# Patient Record
Sex: Male | Born: 1935 | Race: Black or African American | Hispanic: No | Smoking: Former smoker
Health system: Southern US, Community
[De-identification: ages and names within clinical notes are randomized; demographics above are authoritative.]

## PROBLEM LIST (undated history)

## (undated) DIAGNOSIS — U071 COVID-19: Secondary | ICD-10-CM

## (undated) DIAGNOSIS — M6282 Rhabdomyolysis: Secondary | ICD-10-CM

## (undated) DIAGNOSIS — H269 Unspecified cataract: Secondary | ICD-10-CM

## (undated) DIAGNOSIS — E785 Hyperlipidemia, unspecified: Secondary | ICD-10-CM

## (undated) DIAGNOSIS — N189 Chronic kidney disease, unspecified: Secondary | ICD-10-CM

## (undated) DIAGNOSIS — I10 Essential (primary) hypertension: Secondary | ICD-10-CM

## (undated) DIAGNOSIS — M1990 Unspecified osteoarthritis, unspecified site: Secondary | ICD-10-CM

## (undated) DIAGNOSIS — F32A Depression, unspecified: Secondary | ICD-10-CM

## (undated) DIAGNOSIS — I639 Cerebral infarction, unspecified: Secondary | ICD-10-CM

## (undated) DIAGNOSIS — F329 Major depressive disorder, single episode, unspecified: Secondary | ICD-10-CM

## (undated) HISTORY — PX: CHOLECYSTECTOMY: SHX55

## (undated) HISTORY — DX: Hyperlipidemia, unspecified: E78.5

## (undated) HISTORY — PX: CATARACT EXTRACTION: SUR2

## (undated) HISTORY — PX: SPINE SURGERY: SHX786

## (undated) HISTORY — DX: Unspecified cataract: H26.9

## (undated) HISTORY — DX: Unspecified osteoarthritis, unspecified site: M19.90

## (undated) HISTORY — DX: Chronic kidney disease, unspecified: N18.9

## (undated) HISTORY — DX: Depression, unspecified: F32.A

## (undated) HISTORY — PX: FOOT SURGERY: SHX648

## (undated) HISTORY — DX: Major depressive disorder, single episode, unspecified: F32.9

## (undated) HISTORY — PX: BACK SURGERY: SHX140

---
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
## 2000-06-26 ENCOUNTER — Emergency Department (HOSPITAL_COMMUNITY): Admission: EM | Admit: 2000-06-26 | Discharge: 2000-06-27 | Payer: Self-pay | Admitting: *Deleted

## 2000-07-30 ENCOUNTER — Ambulatory Visit (HOSPITAL_COMMUNITY): Admission: RE | Admit: 2000-07-30 | Discharge: 2000-07-30 | Payer: Self-pay | Admitting: General Surgery

## 2000-09-17 ENCOUNTER — Encounter: Admission: RE | Admit: 2000-09-17 | Discharge: 2000-12-16 | Payer: Self-pay | Admitting: Internal Medicine

## 2001-07-27 ENCOUNTER — Emergency Department (HOSPITAL_COMMUNITY): Admission: EM | Admit: 2001-07-27 | Discharge: 2001-07-27 | Payer: Self-pay | Admitting: Emergency Medicine

## 2003-06-08 ENCOUNTER — Ambulatory Visit (HOSPITAL_COMMUNITY): Admission: RE | Admit: 2003-06-08 | Discharge: 2003-06-08 | Payer: Self-pay | Admitting: Internal Medicine

## 2003-07-21 ENCOUNTER — Ambulatory Visit (HOSPITAL_COMMUNITY): Admission: RE | Admit: 2003-07-21 | Discharge: 2003-07-21 | Payer: Self-pay | Admitting: Neurosurgery

## 2003-07-23 ENCOUNTER — Ambulatory Visit (HOSPITAL_COMMUNITY): Admission: RE | Admit: 2003-07-23 | Discharge: 2003-07-23 | Payer: Self-pay | Admitting: Neurosurgery

## 2003-10-14 ENCOUNTER — Inpatient Hospital Stay (HOSPITAL_COMMUNITY): Admission: RE | Admit: 2003-10-14 | Discharge: 2003-10-16 | Payer: Self-pay | Admitting: Neurosurgery

## 2003-11-06 ENCOUNTER — Emergency Department (HOSPITAL_COMMUNITY): Admission: EM | Admit: 2003-11-06 | Discharge: 2003-11-06 | Payer: Self-pay | Admitting: Emergency Medicine

## 2004-01-11 ENCOUNTER — Inpatient Hospital Stay (HOSPITAL_COMMUNITY): Admission: RE | Admit: 2004-01-11 | Discharge: 2004-01-14 | Payer: Self-pay | Admitting: Neurosurgery

## 2004-02-01 ENCOUNTER — Emergency Department (HOSPITAL_COMMUNITY): Admission: EM | Admit: 2004-02-01 | Discharge: 2004-02-01 | Payer: Self-pay | Admitting: Emergency Medicine

## 2004-03-12 ENCOUNTER — Ambulatory Visit (HOSPITAL_COMMUNITY): Admission: RE | Admit: 2004-03-12 | Discharge: 2004-03-12 | Payer: Self-pay | Admitting: Neurosurgery

## 2004-04-06 ENCOUNTER — Inpatient Hospital Stay (HOSPITAL_COMMUNITY): Admission: AD | Admit: 2004-04-06 | Discharge: 2004-04-18 | Payer: Self-pay | Admitting: Emergency Medicine

## 2004-04-13 ENCOUNTER — Encounter: Payer: Self-pay | Admitting: Neurosurgery

## 2004-04-18 ENCOUNTER — Ambulatory Visit: Payer: Self-pay | Admitting: Physical Medicine & Rehabilitation

## 2004-04-18 ENCOUNTER — Inpatient Hospital Stay (HOSPITAL_COMMUNITY)
Admission: RE | Admit: 2004-04-18 | Discharge: 2004-04-28 | Payer: Self-pay | Admitting: Physical Medicine & Rehabilitation

## 2004-05-09 ENCOUNTER — Emergency Department (HOSPITAL_COMMUNITY): Admission: EM | Admit: 2004-05-09 | Discharge: 2004-05-09 | Payer: Self-pay | Admitting: Emergency Medicine

## 2005-09-18 ENCOUNTER — Emergency Department (HOSPITAL_COMMUNITY): Admission: EM | Admit: 2005-09-18 | Discharge: 2005-09-18 | Payer: Self-pay | Admitting: Emergency Medicine

## 2006-08-15 ENCOUNTER — Emergency Department (HOSPITAL_COMMUNITY): Admission: EM | Admit: 2006-08-15 | Discharge: 2006-08-15 | Payer: Self-pay | Admitting: Emergency Medicine

## 2006-08-27 ENCOUNTER — Ambulatory Visit: Payer: Self-pay | Admitting: Cardiology

## 2007-01-07 ENCOUNTER — Ambulatory Visit (HOSPITAL_COMMUNITY): Admission: RE | Admit: 2007-01-07 | Discharge: 2007-01-07 | Payer: Self-pay | Admitting: Ophthalmology

## 2007-02-11 ENCOUNTER — Ambulatory Visit (HOSPITAL_COMMUNITY): Admission: RE | Admit: 2007-02-11 | Discharge: 2007-02-11 | Payer: Self-pay | Admitting: Ophthalmology

## 2007-06-12 ENCOUNTER — Emergency Department (HOSPITAL_COMMUNITY): Admission: EM | Admit: 2007-06-12 | Discharge: 2007-06-12 | Payer: Self-pay | Admitting: Emergency Medicine

## 2008-05-26 ENCOUNTER — Ambulatory Visit: Payer: Self-pay

## 2008-12-16 ENCOUNTER — Ambulatory Visit: Payer: Self-pay | Admitting: Orthopedic Surgery

## 2008-12-16 DIAGNOSIS — M19079 Primary osteoarthritis, unspecified ankle and foot: Secondary | ICD-10-CM | POA: Insufficient documentation

## 2008-12-16 DIAGNOSIS — E1122 Type 2 diabetes mellitus with diabetic chronic kidney disease: Secondary | ICD-10-CM | POA: Insufficient documentation

## 2008-12-16 HISTORY — DX: Primary osteoarthritis, unspecified ankle and foot: M19.079

## 2009-06-18 ENCOUNTER — Emergency Department (HOSPITAL_COMMUNITY)
Admission: EM | Admit: 2009-06-18 | Discharge: 2009-06-18 | Payer: Self-pay | Source: Home / Self Care | Admitting: Emergency Medicine

## 2009-11-28 ENCOUNTER — Encounter: Payer: Self-pay | Admitting: Orthopedic Surgery

## 2010-02-12 ENCOUNTER — Emergency Department (HOSPITAL_COMMUNITY)
Admission: EM | Admit: 2010-02-12 | Discharge: 2010-02-12 | Payer: Self-pay | Source: Home / Self Care | Admitting: Internal Medicine

## 2010-02-15 ENCOUNTER — Encounter: Payer: Self-pay | Admitting: Orthopedic Surgery

## 2010-02-19 ENCOUNTER — Encounter: Payer: Self-pay | Admitting: Neurosurgery

## 2010-02-28 NOTE — Letter (Signed)
Summary: [facility] medical record request  [facility] medical record request   Imported By: Ruffin Pyo 12/29/2009 14:25:23  _____________________________________________________________________  External Attachment:    Type:   Image     Comment:   External Document

## 2010-03-16 NOTE — Letter (Signed)
Summary: medical record request [facility]  medical record request [facility]   Imported By: Ihor Austin 03/08/2010 21:46:22  _____________________________________________________________________  External Attachment:    Type:   Image     Comment:   External Document

## 2010-06-16 NOTE — Op Note (Signed)
NAME:  Mike Briggs, BONGO NO.:  [id number]   MEDICAL RECORD NO.:  [id number]          PATIENT TYPE:  INP   LOCATION:  3199                         FACILITY:  [facility]   PHYSICIAN:  Marchia Meiers. Vertell Limber, M.D.  DATE OF BIRTH:  1928/09/23   DATE OF PROCEDURE:  04/13/2004  DATE OF DISCHARGE:                                 OPERATIVE REPORT   PREOPERATIVE DIAGNOSES:  1.  Recurrent herniated lumbar disc.  2.  Spondylosis.  3.  Disc stenosis.  4.  Degenerative disc disease.  5.  Radiculopathy L3-4 and L4-5 levels.   POSTOPERATIVE DIAGNOSES:  1.  Recurrent herniated lumbar disc.  2.  Spondylosis.  3.  Disc stenosis.  4.  Degenerative disc disease.  5.  Radiculopathy L3-4 and L4-5 levels.   PROCEDURE:  Redo decompression and discectomy L3-4 and L4-5 with  transforaminal lumbar interbody fusion with PEAK interbody cages and bone  morphogenic protein with pedicle screw fixation L3-L5 bilaterally with  posterolateral arthrodesis and local bone autograft.   SURGEON:  Marchia Meiers. Vertell Limber, M.D.   ASSISTANT:  Otilio Connors, M.D.   ANESTHESIA:  General endotracheal anesthesia.   ESTIMATED BLOOD LOSS:  300 mL.   COMPLICATIONS:  None.   DISPOSITION:  Recovery.   INDICATIONS FOR PROCEDURE:  Areg Chey is a 77 year old man with severe  low back and bilateral lower extremity pain, right greater than left, who  has had recurrent disc herniation and significant spondylitic foraminal  stenosis causing neural compression.  His right leg is more symptomatic than  his left.  It was elected to take him to surgery for decompression and  fusion at these effected levels.   PROCEDURE:  Mr. Labat was brought to the operating room.  Following  satisfactory and uncomplicated induction of general endotracheal anesthesia  and placement of intravenous lines, the patient was placed in the prone  position on the operating table.  His low back was then prepped and draped  in the usual sterile  fashion.  Area of planned incision was infiltrated with  0.25% Marcaine and 0.5% lidocaine with 1:200,000 epinephrine.  Incision was  made in the midline through his previous lumbar incision, carried through  scar tissue to the lumbodorsal fascia which was incised bilaterally.  Subperiosteal dissection was performed exposing the L3, L4 and L5 transverse  processes bilaterally.  Self-retaining retractor was placed to facilitate  exposure.  Intraoperative x-ray confirmed correct level.  With marker probes  at L3, L4 and L5 transverse processes.  Subsequently, the facets were  decorticated on the left at L3-L5 levels and then on the right, total  hemilaminectomy of L3 and L4 was performed with resultant decompression of  the scarred in thecal sac and lateral recess.  Subsequently under Loupe  magnification, discectomy was performed at L3-4 and L3-4 on the right and  this was completed with a variety of pituitary rongeurs.  Subsequently, it  was elected to place pedicle screws on the left and this was done with 45 x  5.5 mm screw at L3, 50 x 5.5 mm screw at L4 and similarly sized screw at  L5.  Using the screws and interbody paddle, the distraction was performed and  initially 14 mm distractor was placed and the rod was locked in position on  the left side.  The curettes were then used to strip the end plates of  residual disc material.  Bone morphogenic protein had been mixed up and was  used because the patient is diabetic, morbidly obese and in poor health and  it was not certain that he would lay down good bone without assistance with  [employer].  A piece of collagen sponge was inserted deep in the  interspace.  An additional sponge was placed in the 14 mm PEAK cage.  This  was inserted in the interspace and countersunk appropriately.  Additional  and morselized bone autograft was then placed overlying the cage and packed  into position.  Its positioning was confirmed on lateral  fluoroscopy.  Subsequently, a 12 mm cage was placed at the L4-5 level after redo  discectomy was performed at this level.  The end plates were stripped of  residual disc material and BMP sponge was placed deep in the interspace.  Additional sponge was placed within the PEAK cage and then morselized bone  autograft was placed overlying it.  After this, the additional matching  pedicle screws were placed on the right side at L3, L4 and L5.  The grafts  were placed in compression.  The remaining BMP and autograft were layed over  the decorticated facet joints at L3, L4 and L5 on the left side of the  midline.  The self-retaining retractors were removed.  Prior to removing C-  arm fluoroscopy, AP and lateral radiographs to confirm positioning of  pedicle screws.  The L3 screw on the right appeared to be somewhat medial in  its positioning so it was removed and then very carefully, the screw tract  was palpated with the ball probe and there did not appear to be any cutouts  on this tract and it was therefore elected to replace the screw in its  previous position.  The 70 mm rods were then locked in slight compression.  The self-retaining retractor was removed.  The lumbodorsal fascia was closed  with #1 Vicryl sutures.  The subcutaneous tissues were reapproximated with 2-  0 Vicryl interrupted inverted sutures and skin edges reapproximated with  interrupted 3-0 Vicryl subcuticular stitch.  Wound was dressed with Benzoin,  Steri-Strips, Telfa gauze and tape.  The patient was extubated in the  operating room and taken to the recovery room in stable and satisfactory  condition having tolerated his operating room well.  Counts correct at the  end of the case.      JDS/MEDQ  D:  04/13/2004  T:  04/13/2004  Job:  [id number]

## 2010-06-16 NOTE — Op Note (Signed)
NAME:  BLY, BAE NO.:  [id number]   MEDICAL RECORD NO.:  [id number]          PATIENT TYPE:  INP   LOCATION:  2899                         FACILITY:  [facility]   PHYSICIAN:  Marchia Meiers. Vertell Limber, M.D.  DATE OF BIRTH:  May 22, 1928   DATE OF PROCEDURE:  01/11/2004  DATE OF DISCHARGE:                                 OPERATIVE REPORT   PREOPERATIVE DIAGNOSIS:  Recurrent stenosis L3-4 and L4-5 right with  spondylosis, degenerative disc disease and radiculopathy.   POSTOPERATIVE DIAGNOSIS:  Recurrent stenosis L3-4 and L4-5 right with  spondylosis, degenerative disc disease and radiculopathy.  Herniated lumbar  disc L4-5 right.   PROCEDURE:  Laminoforaminotomies L3-4 and L4-5 left with redo  laminoforaminotomy L3-4 right and redo laminoforaminotomy L4-5 right with  microdiscectomy L4-5 right with microdissection.   SURGEON:  Marchia Meiers. Vertell Limber, M.D.   ASSISTANT:  Hosie Spangle, M.D.   ANESTHESIA:  General endotracheal anesthesia.   ESTIMATED BLOOD LOSS:  Minimal.   COMPLICATIONS:  None.   DISPOSITION:  Recovery.   INDICATIONS FOR PROCEDURE:  Mike Briggs is a 77 year old man who had  previously undergone lumbar decompressive procedure in the 1970s on the  right side and now developed severe right lower extremity pain and weakness  along with numbness.  It was elected to take him to surgery for a redo  decompression and he has left leg numbness as well so it was planned to do  this procedure bilaterally.   DESCRIPTION OF PROCEDURE:  Mike Briggs is brought to the operating room.  Following satisfactory and uncomplicated induction of general endotracheal  anesthesia, the patient is placed in the prone position on the Wilson frame.  His low back was then prepped and draped in the usual sterile fashion. The  area of planned incision was infiltrated with 0.25% Marcaine and 0.5%  lidocaine with 1:300,000 epinephrine.  The previous incision was reopened.  This was to  the right of midline and the old scar was removed.  The incision  was undermined for later closure.  The bilateral exposure was then performed  with subperiosteal dissection exposing the L3-4 and L4-5 levels and this was  confirmed on intraoperative x-ray.  On the left side of midline using Loupe  magnification, the hemisemilaminectomy of L3 and of L4 were then performed  with foraminotomies overlying the left L3 and L4 and L5 nerve roots.  After  the thecal sac and nerve roots were decompressed and lateral recesses were  decompressed, hemostasis was assured with Gelfoam soaked in thrombin.  Attention was then turned to the right side of the midline where there was a  significant amount of scar tissue and careful dissection was performed to  get through the planes to get to the L3. The remaining portion of the L3  lamina on the right and of L4 lamina on the right.  Using very painstaking  microdissection technique, under [name] and subsequently under  the operating microscope, scar tissue was cleared from the bone and  ligamentous tissue was decompressed and removed.  The lateral recess  developed 3-4 was decompressed and with  significant decompression of the L3  foramen and the course of the L4 nerve root.  Attention was then turned to  the right side of L4-5 where there was a considerable amount of scar tissue  and the lateral recess remained quite compromised.  On further  microdissection, fragment of herniated disc material was identified in the  significant amount of herniated disc material was subsequently removed from  the lateral recess which was compressing the L4 nerve root within the  foramen as well as the L5 nerve root.  These were decompressed and after a  considerable amount of disc material was removed.  The L5 and L4 nerve roots  were significantly less compressed.  Hemostasis was achieved at this level  and subsequently the wound was then copiously irrigated  with Bacitracin and  saline.  All nerve roots felt to be well decompressed and no evidence of any  CSF leak.  The wound was then bathed in 100 mcg of Fentanyl and 2 mL of Depo-  Medrol.  The self-retaining retractor was removed.  Lumbodorsal fascia was  closed with #1 Vicryl suture.  Subcutaneous tissue approximated with 0 and 2-  0 Vicryl interrupted inverted sutures.  The skin edges were reapproximated  with 3-0 Vicryl subcuticular stitch.  The wound was dressed with Benzoin and  Steri-Strips, Telfa gauze and tape.  The patient was extubated in the  operating room and taken to the recovery room in stable and satisfactory  condition having tolerated the procedure well.  Counts were correct at the  end of the case.      Jose   JDS/MEDQ  D:  01/11/2004  T:  01/11/2004  Job:  [id number]

## 2010-06-16 NOTE — Consult Note (Signed)
NAME:  Mike Briggs, Mike Briggs NO.:  [id number]   MEDICAL RECORD NO.:  [id number]          PATIENT TYPE:  INP   LOCATION:  IC08                          FACILITY:  [facility]   PHYSICIAN:  Alison Murray, M.D.DATE OF BIRTH:  1928/03/26   DATE OF CONSULTATION:  DATE OF DISCHARGE:                                   CONSULTATION   REASON FOR CONSULTATION:  Unresponsive hyperkalemia and renal insufficiency.   Mr. Samborski is a 77 year old African-American with a past medical history of  diabetes, hypertension, possibly mild renal insufficiency, who now presently  came to the emergency room with complaints of weakness and has had some  difficulty in walking.  Hence, when blood work was done he was found to BUN  of 108 and creatinine of 4.3, and potassium was 7.3.  According to the note,  the patient was found to have also an EKG change, some calcium gluconate was  given and insulin also was given and 90 g of Kayexalate was given.  After  all this, his potassium after five hours was still found to be 7.5.  Hence,  the patient is moved to intensive care unit and consult is called for  dialysis.  At this moment Mr. Sens denies any previous history of renal  failure and also no history of kidney stones; however, from his blood work  before in December 2005, his creatinine was 1.5.  Otherwise no other  additional information.  At this moment the patient denies any nausea or  vomiting.   PAST MEDICAL HISTORY:  1.  He has severe degenerative joint disease, and he has cervical and lumbar      stenosis with multiple surgeries at different times, and he is scheduled      to have another surgery because he has numbness of his right leg and      weakness.  2.  He has a history of diabetes.  3.  He has history of hypertension.  4.  Also a history of back pain.   His medications as an outpatient include lisinopril,  triamterene/hydrochlorothiazide, metformin and pain medication, but  presently he is on Rocephin 1 g IV q.24 hours.  His IV delivers at 150  mL/hour.  He received also Vibramycin 100 mg b.i.d.  He is also getting some  insulin.  He is getting also some morphine.   ALLERGIES:  No known allergies.   SOCIAL HISTORY:  No history of smoking, no history of alcohol abuse.  According to him, he lives alone.  He is being taken care of by a CNA  He is  divorced and has two children.   There is no family history of kidney stones.   REVIEW OF SYSTEMS:  He feels okay.  He denies any shortness of breath,  dizziness or lightheadedness.  Appetite is reasonable, but he has  longstanding constipation where he does not move his bowels for a couple of  weeks, I am not sure how long.  He denies any abdominal pain.   PHYSICAL EXAMINATION:  GENERAL:  The patient at this moment seems to be  alert,  in no apparent distress.  VITAL SIGNS:  Blood pressure is 117/74, temperature 97.1, pulse of 118,  respiratory rate is 20.  HEENT:  Dry oral mucosa.  NECK:  Supple, no JVD.  CHEST:  Clear to auscultation, no rales, no rhonchi, no egophony.  CARDIAC:  Regular rate and rhythm, no murmur.  ABDOMEN:  Soft, positive bowel sounds.  EXTREMITIES:  No edema.  GENITOURINARY.  He has about 400 mL of urine.   His blood work, his white blood cell count is 11.2, hemoglobin 10,  hematocrit 30.1. December 13 his hemoglobin was 12, hematocrit was 35.  In  December his potassium was also 5.3, BUN 27, creatinine 1.5.  Presently when  he came, his sodium was 133, potassium 7.1, BUN of 108, creatinine 4.3.  SGOT of 38, SGPT of 53, albumin 3.  Lipase 55. His calcium is 10.7 now,  sodium 133, potassium 7.5, this is after 90 g of Kayexalate over six hours.  BUN 101, creatinine is 3.9.  Urine cloudy, specific gravity 1.01.  He has  some leukocytes, many bacteria, white blood cells 11-20.  His ultrasound  which was done, the right kidney is said to be normal size with 11.6;  however, the left kidney  measures 6.3, significantly echogenic, and there is  no hydronephrosis.   ASSESSMENT:  1.  Renal insufficiency, probably acute on chronic.  The etiology for this      worsening renal failure could be probably prerenal versus acute tubular      necrosis; however, natural progression of his renal failure cannot be      ruled out as the patient had a creatinine of 1.5 in December.  2.  Atrophic left kidney, etiology not clear at this moment.  Could be _____      nephrosclerosis or unilateral infection, kidney stone. However, in a      patient with one kidney who is on ACE inhibitor and renal failure,      hypertension, renal artery stenosis also needs to be put into the      differential diagnosis.  3.  Hyperkalemia.  It could be multifactorial, including renal insufficiency      possibly since the patient also has history of diabetes and _____ and      for continue high potassium and ACE inhibitors all playing some role.  4.  Anemia.  Not sure whether this is related to his renal insufficiency.      At this moment, iron-deficiency anemia also needs to be considered.  5.  History of diabetes.  He is on hypoglycemic agents, metformin as an      outpatient.  6.  History of also hypercalcemia, not sure whether this is related to being      dry or whether he has other problems, multiple myeloma especially, in      view of anemia, renal failure and normal total protein in the presence      of low albumin.  7.  Slight elevation of lipase.  8.  History of urinary tract infection.  9.  Hypertension.  Blood pressure seems to be reasonable.  10. Elevated SGOT and SGPT without previous history of alcohol abuse.   RECOMMENDATIONS:  Will dialyze the patient, probably will hydrate him  aggressively and will repeat for his blood work.  Once the patient's  condition improves, probably will do MRA to rule out renal artery stenosis. Will do also iron studies to rule out iron-deficiency anemia.  Probably  the  patient may benefit from stool for guaiac.  Will continue his other  medication, and if patient needs further dialysis will make an arrangement,  but for now will just dialyze him to remove his potassium.     BB/MEDQ  D:  04/07/2004  T:  04/07/2004  Job:  [id number]

## 2010-06-16 NOTE — Op Note (Signed)
NAME:  Mike Briggs, Mike Briggs NO.:  [id number]   MEDICAL RECORD NO.:  [id number]                   PATIENT TYPE:  INP   LOCATION:  2899                                 FACILITY:  [facility]   PHYSICIAN:  Marchia Meiers. Vertell Limber, M.D.               DATE OF BIRTH:  1928-11-28   DATE OF PROCEDURE:  10/14/2003  DATE OF DISCHARGE:                                 OPERATIVE REPORT   PREOPERATIVE DIAGNOSES:  Herniated cervical disk with cervical spondylosis,  cervical myelopathy, and cervical stenosis with cord compression C3-4 level.   POSTOPERATIVE DIAGNOSES:  Herniated cervical disk with cervical spondylosis,  cervical myelopathy, and cervical stenosis with cord compression C3-4 level.   PROCEDURE:  1.  Exploration of fusion C4-C7 with removal of previously placed anterior      cervical plate.  2.  Anterior cervical decompression and fusion C3-4 level with allograft      bone graft and anterior cervical plate.   SURGEON:  Marchia Meiers. Vertell Limber, M.D.   ASSISTANT:  Ophelia Charter, M.D.   ANESTHESIA:  General endotracheal anesthesia.   ESTIMATED BLOOD LOSS:  75 cc.   COMPLICATIONS:  None.   DISPOSITION:  Recovery.   INDICATIONS:  The patient is a 77 year old man who had previously undergone  anterior cervical decompression and fusion C4-C7 level by another physician.  He has developed cervical myelopathy with bilateral upper extremity numbness  and cervical myelopathy with cord compression at the C3-4 level.  It was  therefore elected to take him to surgery for exploration of prior fusion and  anterior cervical decompression and fusion at the C3-4 level.   PROCEDURE:  The patient was brought to the operating room.  Following  satisfactory and uncomplicated induction of general endotracheal anesthesia,  and placement of intravenous lines, the patient was placed in the supine  position on the operating table.  His neck was placed in neutral alignment.  He was placed in  10 pounds of halter traction.  His anterior neck was then  prepped and draped in the usual sterile fashion.  The area of the planned  incision was infiltrated with 0.25% Marcaine and 0.5% lidocaine and  1:200,000 epinephrine.  An incision was made in the midline to the anterior  border of the sternocleidomastoid muscle, carried sharply through the  platysma layer.  Subplatysmal dissection was then performed exposing the  anterior border of the sternocleidomastoid.  Using blunt dissection the  carotid sheath was kept lateral and trachea and esophagus kept medial to  expose the anterior cervical spine.  The previously placed Codman plate was  then dissected from its investing soft tissue.  There was also bony  overgrowth overlying the plate and this was very carefully removed.  The  previously placed screws were removed with considerable difficulty and it  took approximately an hour and a half to remove the previously placed plate.  After all the screws were  removed and the plate was removed, the previous  level of fusion was inspected.  There did not appear to be any evidence of  pseudarthrosis.  The bone graft appeared to be rigidly incorporated and the  fusion mass appeared to be solid.  It was therefore elected not to replace  the plate at the lower levels and it was elected to perform decompression at  C3-4 level.  Subsequently, the longus coli muscles coli were taken down from  the anterior cervical spine from C3 through C4 levels bilaterally using  electrocautery and key elevator.  A Shadowline self-retaining retractor was  placed to facilitate exposure.  The interspace was then incised and disk  material was removed in piecemeal fashion and a variety of Carlen curettes  were used to strip the end plates of residual disk material.  There was some  significant bony overgrowth overlying the C3-4 level and this was then  decompressed using Beyer rongeur.  Disk space spreader was placed  to  facilitate exposure.  Using the microscope, the high-speed drill was used to  decorticate the end plates of C3 and C4 and subsequently a variety of cold-  tip Kerrison rongeurs were used to decompress the cervical spinal cord dura  and lateral recesses as well as the undersurface of the inferior aspect of  C3 and the superior aspect of C4.  The spinal cord dura was decompressed and  it was felt that a significant cord compression was relieved.  Hemostasis  was assured using Gelfoam soaked in thrombin.  A 7-mm machine cortical bone  graft was then packed with the retained drillings from the end plates of C3  and C4 and then this was inserted in the interspace and countersunk  appropriately.  An 18-mm [employer] anterior cervical plate was then affixed  to the anterior cervical spine using 14-mm variable angle screws, two at C3,  two at C4.  All screws had excellent purchase.  Locking mechanisms were  engaged.  Final x-ray was obtained and then after this the locking  mechanisms were engaged.  The halter traction was removed after placement of  bone graft.  Of note, the left superior C4 screw had broken prior to removal  of the plate and a small retained portion of the screw remained.  The new  screw was placed in a different trajectory inferior to this screw so as not  to interfere with it.  The wound was then copiously irrigated with  bacitracin and saline.  Soft tissues were inspected and found to be in good  repair.  Hemostasis was assured.  The wound was then closed with 3-0 Vicryl  sutures at the platysma layer and 3-0 Vicryl subcuticular inverted stitches  reapproximated skin edges.  The patient was extubated in the operating room  and taken to the recovery room in stable satisfactory condition having  tolerated his operation well.  Counts were correct at the end of the case.                                              Marchia Meiers. Vertell Limber, M.D.    JDS/MEDQ  D:  10/14/2003  T:   10/14/2003  Job:  [id number]

## 2010-06-16 NOTE — Procedures (Signed)
NAME:  AKSHIT, BROSSMAN NO.:  [id number]   MEDICAL RECORD NO.:  [id number]          PATIENT TYPE:  EMS   LOCATION:  ED                            FACILITY:  [facility]   PHYSICIAN:  Edward L. Luan Pulling, M.D.DATE OF BIRTH:  20-Dec-1928   DATE OF PROCEDURE:  05/09/2004  DATE OF DISCHARGE:  05/09/2004                                EKG INTERPRETATION   The rhythm is sinus rhythm with the rate in the 70's. There is artifact in  the tracing. The computer is read as probably early repolarization pattern  which I do not see.   IMPRESSION:  Abnormal electrocardiogram.      ELH/MEDQ  D:  05/10/2004  T:  05/11/2004  Job:  [id number]

## 2010-06-16 NOTE — Discharge Summary (Signed)
NAME:  Mike Briggs, TOUCHSTONE NO.:  [id number]   MEDICAL RECORD NO.:  [id number]          PATIENT TYPE:  IPS   LOCATION:  [id number]                         FACILITY:  [facility]   PHYSICIAN:  Jarvis Morgan, M.D.   DATE OF BIRTH:  1928/07/30   DATE OF ADMISSION:  04/18/2004  DATE OF DISCHARGE:  04/28/2004                                 DISCHARGE SUMMARY   DISCHARGE DIAGNOSES:  1.  Lumbar stenosis, L3-L4 and L4-L5 requiring decompressive laminectomy      with fusion.  2.  Urinary retention, resolved.  3.  Diabetes mellitus, type 2.  4.  Constipation resolved.  5.  Hypertension.  6.  Anemia.   HISTORY OF PRESENT ILLNESS:  Mr. Raygor is a 77 year old male with a history  of hypertension, chronic renal insufficiency, DDD with prior cervical and  lumbar laminectomy with recurrent low back pain and bilateral extremity  pain, right greater than left secondary to recurrent HNP.  He was  transferred to [facility] via [facility] on April 13, 2004 for  surgery.  On April 13, 2004, the patient underwent redo decompressive  laminectomy, L3-L4 and L4-L5, with fusion and Peak cages with bone graft,  with spondylosis and stenosis by Dr. Vertell Limber.  Postoperatively, he has had  some problems with voiding and was stared on Cipro for leukocytosis with  question of UTI.  He also has had some problems with nausea and dizziness  with signs of vestibular dysfunction per PT notes.  Therapy is initiated.  The patient is minimal-to-moderate assist for bed motility, plus total  assist to 50% for transfers, plus total assist to 75% to ambulate 8-10 feet.  CIR was conservative for progression.   PAST MEDICAL HISTORY:  1.  Significant for hypertension.  2.  DM, type 2  3.  Chronic renal insufficiency with renal failure.  4.  Spinal cord injury at the cervical area at C4-C5 in the past.  5.  Dizziness x months.   ALLERGIES:  ACE and NSAIDs.   FAMILY HISTORY:  Positive for diabetes.   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   HOSPITAL COURSE:  Mr. Maggie Krippner was admitted to rehab on April 18, 2004  for inpatient therapies to consist of PT/OT daily. In his past admission  secondary to decreasing mobility, he was started on subcutaneous Lovenox for  DVT prophylaxis.  CBGs were checked on an a.c. and h.s. basis, and the  patient's Glucophage was resumed at 250 mg b.i.d.  The blood sugar was  monitored on an a.c. and h.s. basis and has shown good control overall  ranging in 120s to an occasional 150s.  The patient has been instructed  regarding dietary discretion and reports that he will continue to monitor  diet past discharged.  The patient's back incision has been monitored along.  This has been healing well without any signs or symptoms of infection, no  drainage or erythema noted.  This is  intact.  The patient's pain control has  been reasonable with p.r.n. use of oxycodone.  The patient's mobility has  slowly improved during his stay.  Follow-up laboratory studies done past  admission have shown his postoperative anemia to be stable with hemoglobin  8.6 and hematocrit 25.5.  The patient is started on iron supplement and  needs to continue this past discharge.  Check of lytes shows a sodium of  137, potassium 36, chloride 102, CO2 29, BUN 6, creatinine 0.9, glucose 127.  The patient's mood has been stable. He had been moderated and has made  steady progress during his stay.  At time of discharge, the patient is at  modified independent level for bed mobility, modified independent level for  transfers, moderate independent level for ambulating 150 feet with a rolling  walker.  The patient is at supervision level for upper body care and  dependent level for low body care.  Further follow-up therapies set up to  include home health, PT and OT by [facility] past discharge.  On  April 28, 2004, the patient is discharged to home.   DISCHARGE MEDICATIONS:  1.  Senokot-S 2 p.o. q.h.s.  2.  Lopressor 25 mg b.i.d.  3.  Maxzide 37.5/25 per day.  4.  Glucophage 250 mg b.i.d.  5.  Ferrous sulfate 325 mg b.i.d.  6.  MiraLax 17 g per day.  7.  Oxycodone 5-10 mg q.4-6 h. p.r.n. pain.   ACTIVITY:  As tolerated  Has been sitting at the edge of the bed.   DIET:  Diabetic diet.   SPECIAL INSTRUCTIONS:  No alcohol, no smoking, no driving, physical therapy  at [facility] PT/OT.   FOLLOW UP:  The patient to follow up with Dr. Vertell Limber and Dr. Woody Seller in the next  couple of the weeks, follow up with Dr. Jarvis Morgan as needed.      PP/MEDQ  D:  04/28/2004  T:  04/29/2004  Job:  [id number]   cc:   Marchia Meiers. Vertell Limber, M.D.  [address]  [place]  [place] [address]  Fax: [phone]   Woody Seller, M.D.

## 2010-06-16 NOTE — Discharge Summary (Signed)
NAME:  Mike Briggs, Mike Briggs NO.:  [id number]   MEDICAL RECORD NO.:  [id number]          PATIENT TYPE:  INP   LOCATION:  A212                          FACILITY:  [facility]   PHYSICIAN:  Bonnielee Haff, MD     DATE OF BIRTH:  [birth year]   DATE OF ADMISSION:  04/06/2004  DATE OF DISCHARGE:  03/15/2006LH                                 DISCHARGE SUMMARY   DISCHARGE DIAGNOSES:  1.  Acute renal failure with hyperkalemia, resolved.  2.  Hemodialysis x 1 for #1.  3.  Diabetes.  4.  Anemia of chronic disease.  5.  Hypertension.  6.  Chronic back pain to undergo surgery on March 16.   Please review the dictation of March 9 regarding patient's presenting  illness.   BRIEF HOSPITAL COURSE:  Briefly, this is a 77 year old African-American male  who presented to the ER with weakness for about one week.  He was found to  have acute renal failure and was admitted for further management.   #1.  ACUTE RENAL FAILURE:  Etiology was unclear.  The likely causes were  prerenal versus acute tubular necrosis.  Upon admission, the patient had a  very high potassium in the 7's.  The patient was given Kayexalate, bicarb,  and calcium gluconate; however, with these medications, the potassium did  not go down.  Because of refractory hyperkalemia and acute renal failure,  Dr. Lowanda Foster from nephrology was emergently consulted, and the patient  underwent emergent dialysis.  A dialysis catheter was placed by Dr. Irving Shows in the right subclavian region.  The patient required dialysis only  one time.  His potassium responded.  Over a period of time, his creatinine  came down to normal limits from a high of 4.3.  The patient had further  workup for acute renal failure performed with ultrasound of his kidneys  which showed that the left kidney was atrophic and mildly echogenic  suggesting renal artery stenosis.  No hydronephrosis was seen.  The right  kidney was normal.  The plan was to do an MRI;  however, the patient  mentioned that he had a plate, and so this was deferred.  The plan would be  to do an outpatient renal Doppler to evaluate his renal arteries.   After the initial dialysis, the patient continued to improve.  He was making  an adequate amount of urine.  We followed his electrolytes serially on a  daily basis, and they have been improving steadily.  On the day of discharge  from [facility], all of his electrolytes are within normal limits,  and creatinine and BUN are also within normal limits.   #2.  ANEMIA:  The patient was found to be anemic at the time of admission.  Iron profile was done which suggested anemia of chronic disease.  He did  require 2 units of blood during this admission.  Since the transfusion, his  hemoglobin has stayed stable in the 10s.  The patient was also evaluated for  blood loss; however, no evidence was found for the same.   #3.  DIABETES:  On admission, the patient was on metformin.  Because of  acute renal failure, metformin was held.  We started the patient on Lantus  insulin with which his CBGs have been very well controlled.  At the time of  discharge, the patient may be discharged home from [facility] on an oral  hypoglycemic agent.   #4.  HYPERTENSION:  The patient was on ACE inhibitor and diuretics at the  time of admission which were held because of renal failure.  The patient  should not be put on an ACE inhibitor because of suspicion of renal artery  stenosis.  I have started the patient on metoprolol with which his blood  pressure has improved and has ultimately been controlled.   #5.  At the time of admission, the patient had some ureter discharge.  He  was started on ceftriaxone and doxycycline.  Cultures have been negative,  and doxycycline was discontinued.  Ceftriaxone was continued for about five  days.   #6.  CHRONIC BACK PAIN:  The patient gives history of low back pain for more  than a month.  He was under  evaluation by Dr. Vertell Limber and actually is supposed  to be scheduled for surgery on March 16.  I discussed the patient's case  with Dr. Vertell Limber, and it was thought the best thing for the patient would be  to transfer him from [facility] to [facility] and for him to undergo surgery  as scheduled.  The patient has been put on Vicodin but has achieved only  minimal pain relief.   #7.  The patient has been put on DVT prophylaxis with heparin.   At the time of discharge, the patient's vital signs are all stable.  His  only complaint is of low back pain.  Otherwise, he feels very well, and he  is considered okay for transfer.   DISCHARGE MEDICATIONS:  Please note that the following medications may be  changed upon final discharge from [facility].  At this time, the  patient is on:  1.  Vicodin.  2.  Lantus 10 units subcutaneously.  3.  Metoprolol 25 mg b.i.d.  4.  Protonix 40 mg daily.  5.  Tylenol p.r.n.  6.  Morphine p.r.n.  7.  He was on heparin for DVT prophylaxis which is being held per Dr.      Melven Sartorius orders.   PT/OT have seen this patient, and they have been trying to increase his  strength.  However, because of pain, the patient has not been able to  cooperate well.   DIET:  The patient may eat an 1800 calorie ADA diet.   PHYSICAL ACTIVITY:  To be determined post surgery.   FOLLOW UP:  Once the patient undergoes surgery and is discharged from [facility], he will need to follow up with his primary care doctor, Dr. Woody Seller.      GK/MEDQ  D:  04/12/2004  T:  04/12/2004  Job:  [id number]   cc:   Marchia Meiers. Vertell Limber, M.D.  [address]  [place]  [place] [address]  Fax: [phone]   Alison Murray, M.D.  [address]  [place]  [place] [address]  Fax: [phone]

## 2010-06-16 NOTE — Discharge Summary (Signed)
NAME:  Mike Briggs, Mike Briggs NO.:  [id number]   MEDICAL RECORD NO.:  [id number]          PATIENT TYPE:  INP   LOCATION:  3114                         FACILITY:  [facility]   PHYSICIAN:  Marchia Meiers. Vertell Limber, M.D.  DATE OF BIRTH:  [birth year]   DATE OF ADMISSION:  04/06/2004  DATE OF DISCHARGE:  04/18/2004                                 DISCHARGE SUMMARY   REASON FOR ADMISSION AND FINAL DIAGNOSES:  1.  Acute renal failure.  2.  Hypopotassemia.  3.  Hypo-osmolality.  4.  Urinary tract infection.  5.  Lumbar disk herniation.  6.  Lumbosacral spondylosis.  7.  Lumbar disk degeneration.  8.  Type 2 diabetes.  9.  Hypertension and additionally hypotension.  10. Anemia of other chronic illness.  11. Urethral discharge.  12. Cardiac dysrhythmias.  13. Hypopotassemia.  14. Chronic kidney disease.   HISTORY OF PRESENT ILLNESS/HOSPITAL COURSE:  Mike Briggs is a 77 year old  man who was admitted to [facility] for acute renal failure which  required hospitalization.  The patient lives alone with no support.  He had  been previously scheduled for an elective decompressive procedure, and it  was elected, after seeking with his physician at [facility] to  transfer the patient from [facility] for surgery.  His case was  discussed with the Renal Service who felt that no further interventions were  required and that his acute renal failure had now improved.  The patient was  then taken after being cleared both by anesthesia and renal service.  He was  taken to surgery on March 16, at which point he underwent redo decompression  and diskectomy at L3-4 and L4-5 levels with transforaminal lumbar interbody  fusion and pedicle screw fixations L3-L5 levels bilaterally with __________  Autograft __________ protein with posterolateral arthrodesis.  Postoperatively, the patient had improvement in his lower extremity  complaints.  His renal functions were without  significant pertubation.  He  had an elevated  temperature on March 18 of 102.2.  He was felt to have a  urinary tract infection and was begun on ciprofloxacin.  He was transferred  from the ICU to the ACU postoperatively.  He was then mobilized, and on  March 20, rehabilitation services were consulted.  The patient was elected  to be a good candidate for inpatient rehabilitation, and he was transferred  to the rehabilitation service as of March 21, with instructions to follow up  with me in my office in three weeks with postoperative x-rays and follow up  with renal physician's through [facility].     JDS/MEDQ  D:  07/11/2004  T:  07/11/2004  Job:  [id number]

## 2010-06-16 NOTE — H&P (Signed)
NAME:  Mike Briggs, Mike Briggs NO.:  [id number]   MEDICAL RECORD NO.:  [id number]          PATIENT TYPE:  IPS   LOCATION:  [id number]                         FACILITY:  [facility]   PHYSICIAN:  Charlett Blake, M.D.DATE OF BIRTH:  1928-11-12   DATE OF ADMISSION:  04/18/2004  DATE OF DISCHARGE:                                HISTORY & PHYSICAL   MEDICAL RECORD NUMBER:  [id number].   DATE OF BIRTH:  11-Apr-1928.   CHIEF COMPLAINT:  Back pain.   HISTORY OF PRESENT ILLNESS:  Mike Briggs is a 77 year old male with a history  of hypertension, chronic renal insufficiency, degenerative disk disease with  prior cervical and lumbar laminectomy and recurrent low back pain and  bilateral lower extremity pain, right greater than left, secondary to  recurrent herniated nucleus pulposus.  He was admitted originally to [facility] on April 06, 2004, with acute renal failure, nonoliguric,  hyperkalemic, requiring dialysis.  Transferred to [facility] on April 13, 2004, for the surgery.  On April 13, 2004, he underwent redo decompression  laminectomy of L3-4 and L4-5 with fusion and peak cages, as well as bone  graft for lumbar spondylosis, stenosis and recurrent HNP.  Postoperatively  he had problems with voiding on Cipro for leukocytosis, white count 16.2.  He also had some nausea and dizziness and physical therapy felt he had some  vestibular dysfunction.   The patient also complains of constipation and has not had a BM since April 06, 2004, per his report.   REVIEW OF SYSTEMS:  Positive for edema.  Positive for constipation.  Positive for balance problems.  Positive for back pain, urine retention,  numbness, particularly in the right lower extremity, anxiety and depression.   PAST HISTORY:  Significant for:  1.  Hypertension.  2.  Type 2 diabetes.  3.  CRI.  4.  History of spinal cord injury at cervical C4-5 incomplete.  5.  Dizziness.   FAMILY HISTORY:  Significant for  diabetes.   SOCIAL HISTORY:  Lives alone.  A niece will check on him.  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  No tobacco.  No alcohol.  Lives in an  apartment with two steps to entry.   PRIOR FUNCTIONAL STATUS:  Ambulating with a walker.   CURRENT FUNCTIONAL STATUS:  Minimal to maximal assistance for bed mobility,  +2 to total assistance for 50% transfers, +2 total assistance for 75%  ambulation of 8-10 feet.   ALLERGIES:  ACE INHIBITORS and NONSTEROIDALS causing renal insufficiency.   CURRENT MEDICATIONS:  1.  Lopressor 25 mg p.o. b.i.d.  2.  Senokot two p.o. q.h.s.  3.  Protonix 40 mg p.o. daily.  4.  Maxzide 37.5 mg/25 mg one p.o. daily.  5.  Lovenox 40 mg subcutaneously daily.  6.  Antivert 25 mg p.o. q.i.d. p.r.n.  7.  Sorbitol 60 mg p.o. daily.  8.  Glucophage 250 mg p.o. b.i.d.  9.  Ferrous sulfate 325 mg p.o. b.i.d. starting on April 19, 2004.   Hemoglobin on April 18, 2004,  8.1.  This is reduced.  Previous white count  10.8, platelets 448,000.  BUN 9, creatinine 1.1, sodium 136, potassium 3.7.  Chest x-ray showed right basilar atelectasis.   PHYSICAL EXAMINATION:  GENERAL APPEARANCE:  An obese male with facial edema.  No acute distress.  HEENT:  Eyes noninjected.  Anicteric.  Full range of motion of the pupils.  ENT is negative, except that he is edentulous.  NECK:  Supple without adenopathy.  LUNGS:  Respiratory effort is good.  The lungs are clear to auscultation.  HEART:  No tenderness to palpation.  Regular rate and rhythm.  No rubs,  murmurs or extra sounds.  EXTREMITIES:  He has 2+ edema of the right hand and right foot and 1+ of the  left foot.  ABDOMEN:  Positive bowel sounds.  Soft and nontender.  No organomegaly.  SKIN:  Without breakdown.  NEUROLOGIC:  Cranial nerves II-XII intact.  Sensation is reduced on the  right side and L3, 4, 5 and S1 dermatomes.  L5 and S1 are the most numb.   His mental status is intact.   Motor strength is 5/5  in bilateral deltoids, biceps, grip, hip flexion, knee  extension, ankle dorsiflexion, TA and gastrocnemius.   IMPRESSION:  1.  Functional deficits due to lumbar stenosis.  L3-4 and L4-5 decompression      laminectomy with fusion postoperative day #5.  2.  Pain management with p.r.n. oxycodone.  3.  Deep venous thrombosis prophylaxis, subcutaneous Lovenox added.  4.  Leukocytosis, question urinary tract infection.  Appears to be      resolving.  Will discontinue Cipro and reculture.  5.  Renal insufficiency, resolved.  Resume Glucophage and monitor.  6.  Diabetes mellitus, type 2.  7.  Constipation.  Sorbitol today.  Fleets tomorrow if no BM.  8.  Hypertension.  Monitor on Maxzide.  9.  Anemia.  Recheck CBC in a.m.  May need to have transfusion of two units      of packed red blood cells.   The estimated length of stay is 7-10 days.   The patient is a good rehabilitation candidate.  The prognosis for  functional improvement is good.      AEK/MEDQ  D:  04/18/2004  T:  04/18/2004  Job:  [id number]

## 2010-06-16 NOTE — Discharge Summary (Signed)
NAME:  [name], [name]:  [id number]   MEDICAL RECORD NO.:  [id number]          PATIENT TYPE:  INP   LOCATION:  5022                         FACILITY:  [facility]   PHYSICIAN:  Marchia Meiers. Vertell Limber, M.D.  DATE OF BIRTH:  [birth year]   DATE OF ADMISSION:  01/11/2004  DATE OF DISCHARGE:  01/14/2004                                 DISCHARGE SUMMARY   REASON FOR ADMISSION:  1.  Lumbosacral spondylosis.  2.  Type 2 diabetes.  3.  Hypertension.  4.  Lumbar disk degeneration.  5.  Additional medical problems and comorbidities of hypertension and      diabetes.   HISTORY OF ILLNESS AND HOSPITAL COURSE:  Mike Briggs is a 77 year old  man who has previously undergone lumbar decompression by another physician.  He has recurrent stenosis, L3-4 and L4-5 levels, with spondylosis,  degenerative disk disease and radiculopathy.  We have elected to admit him  to the hospital on the same-day-as-procedure basis for him to undergo  decompression without fusion at the L3-4 and L4-5 levels.   This was done and postoperatively, the patient had full strength and had  decreased right leg pain and numbness.  He was gradually mobilized and he  was kept in the hospital until he was up and walking well, since he lives  alone and has no home assistance.  He was doing well on the 15th and was  discharged home with less right leg pain and mobilizing slowly with  instructions to follow up with Dr. Marchia Meiers. Vertell Limber in 3 weeks in the office.   DISCHARGE CONDITION:  Improved.   FINAL DIAGNOSES:  1.  Lumbosacral spondylosis.  2.  Type 2 diabetes.  3.  Hypertension.  4.  Lumbar disk degeneration.  5.  Additional medical problems and comorbidities of hypertension and      diabetes.   DISCHARGE MEDICATIONS:  Oxycodone.      JDS/MEDQ  D:  03/24/2004  T:  03/25/2004  Job:  [id number]

## 2010-06-16 NOTE — H&P (Signed)
NAME:  Mike Briggs, Mike Briggs NO.:  [id number]   MEDICAL RECORD NO.:  [id number]          PATIENT TYPE:  INP   LOCATION:  A207                          FACILITY:  [facility]   PHYSICIAN:  Mike Briggs, M.D.DATE OF BIRTH:  10/31/1928   DATE OF ADMISSION:  04/06/2004  DATE OF DISCHARGE:  LH                                HISTORY & PHYSICAL   PRIMARY CARE PHYSICIAN:  Unassigned. (Mike Briggs in [place])   ORTHOPEDIC SURGEON:  Mike Briggs.   CHIEF COMPLAINT:  Weakness for 1 week.   HISTORY OF PRESENTING COMPLAINT:  Mike Briggs is a pleasant 77 year old  African-American male with a history of diabetes mellitus, hypertension,  currently on lisinopril and hydrochlorothiazide.  He started experiencing  generalized weakness for about 1 week, but no nausea or vomiting.  In  addition, he noted that his urine output was reduced, and he, hence, came to  the emergency department via ambulance.  On arrival in the ED, he had a BMET  which showed a potassium of 7.3, BUN of 108, creatinine of 4.3.  Previous  BUN and creatinine in December 2005 were 27 and 1.5.  He was catheterized,  and about 600 cc of urine came out.  He does have dysuria, and he noted  urethral discharge in the last 2-3 days.  Last sexual contact was about a  year ago.  There is no fever, no chills.   He received 10 mg of IV calcium chloride, one amp of bicarbonate, and 6  units of regular insulin in the emergency department.   REVIEW OF SYSTEMS:  He denies orthopnea, PND, chest pain.  There is no  nausea, no vomiting.  He has had chronic constipation for several months.  No abdominal pain.  No headache or change in his vision. he has chronic back  pain and history of multiple back surgeries. The patient has been unable to  use his walker in the last 2 weeks.  Hence, he is bed bound.   PAST MEDICAL HISTORY:  1.  Diabetes mellitus type 2.  2.  Hypertension.  3.  Chronic back pain.   PAST SURGICAL HISTORY:  Back  surgery.  Lumbar Laminectomies  Cervical Laminectomy   CURRENT MEDICATIONS:  1.  Triamterene/hydrochlorothiazide 37.5/25 one p.o. daily.  2.  Lisinopril 40 mg p.o. daily.  3.  Metformin 500 mg p.o. b.i.d.  4.  Hydrocodone 10/325 one p.r.n.   ALLERGIES:  No known drug allergies.   SOCIAL HISTORY:  The patient is currently living alone, and he has a C.N.A.  that comes in for 3 hours a day to do most of his house chores and feeding.  The patient is an ex-smoker, and he does not drink alcohol.   FAMILY HISTORY:  He is divorced.  Has 2 children.  No family history of  kidney disease or heart disease.   PHYSICAL EXAMINATION:  VITAL SIGNS:  Initial vitals in the emergency  department revealed a temperature of 98, blood pressure 113/68, pulse 112,  and respiratory rate of 20.  O2 saturations of 98.  Blood pressure dropped  to  75/59, and the patient was given a normal saline bolus.  The blood  pressure improved to 125/94.  GENERAL:  He is pale.  Anicteric.  HEENT:  Pupils equal, round and reactive to light.  Normocephalic and  atraumatic head.  Mucous membranes moist.  NECK:  No jugular venous distention.  LUNGS:  Clear clinically to auscultation.  CVS:  S1 and S2.  Regular.  No murmur, gallop, or rub.  ABDOMEN:  Obese, soft, nontender.  No hepatosplenomegaly.  Bowel sounds  present.  RECTAL:  Prostate is not enlarged, and it has a smooth surface.  GROIN:  Urethral discharge noted.  Swab sent for gonorrhea and Chlamydia  with culture and sensitivity.  LUMBAR SPINE:  Surgical scar noted.  CNS:  Motor power 5/5 in upper extremities.  Straight leg raising is limited  on lower extremities bilaterally secondary to pain.  The patient can flex  and extend both knees.  Plantar flexion, both feet, 5/5.  SKIN:  No rash, no petechiae.   LABORATORY DATA:  The initial laboratory data revealed white blood cells of  11.2, hemoglobin 10.0, hematocrit 30.1, MCV 90.1, platelets 359, neutrophils  74%,  lymphocytes 16%.  Sodium 133, potassium 7.3, chloride 106, bicarb 21,  glucose 137, BUN 108, creatinine 4.3, bilirubin 0.4.  Alkaline phosphatase  113, AST 38, ALT 58, total protein 7.2.  Albumin 3.0, calcium 9.0.  Lipase  55.  CK-MB 2.1, normal.  Troponin 0.04.  Urinalysis revealed small  leukocytes.  Cloudy urine in appearance.  Negative for protein, blood,  ketones, bilirubin, and glucose.  Urine microscopy showed hyaline casts,  white blood cells 11-20, bacteria many.  Fraction excretion of  of sodium  0.73%.   Ultrasound of the kidneys; findings revealed normal right kidney with normal  size, shape, and echo texture.  Left kidney is small and mildly echogenic.  Normal in shape.  No hydronephrosis, masses, or calculi.  Overall, atrophic,  mildly echogenic left kidney, suggestive of left renal artery stenosis.  EKG  revealed normal sinus tachycardia with frequent PVCs.  PR interval 182.   ASSESSMENT AND PLAN:  Mike Briggs is a 77 year old African-American male  presenting with weakness, found to be in acute renal failure with creatinine  of 4.3, and hyperkalemia of 7.3.  Ultrasound of the kidney did not show any  hydronephrosis or calculi.  It is suggestive of left renal artery stenosis.  He is currently on potassium-sparing diuretic, and lisinopril.  1.  Acute renal failure, non-oliguric, with hyperkalemia.  Plan - give Kayexalate 30 mg p.o. x1 now; already given in ED.  Repeat again  in 4 hours.  Rehydrate with IV fluids, D-5 in water, plus 2 ampules of  bicarbonate at 125 cc per hour.  A 24-hour urine collection for proteinuria,  creatinine clearance, urine protein electrophoresis.  Would also check serum  electrophoresis.  Nephrology consult already discussed with Mike. Lowanda Foster.  BMET will be checked q.4h. until stable.  1.  Hypotension.  Would continue to rehydrate, as above, and hold      antihypertensives. 2.  Diabetes mellitus type 2.  Will hold metformin.  Start Lantus 10  units      q.h.s.  In addition, sliding scale insulin with NovoLog.  3.  Chronic back pain.  The patient stated that he is scheduled for surgery      next week.  Will need to contact Mike Briggs, and possibly postpone the      procedure.  4.  Anemia.  Most  likely anemia of chronic disease.  We will check iron      studies and stool occult blood.  5.  Urethral discharge.  Specimen already sent for gonococcal and Chlamydia      study.  Will empirically treat with ceftriaxone 1 gm IV and doxycycline      100 mg p.o. b.i.d.  6.  Gastrointestinal prophylaxis with Protonix.  7.  Deep vein thrombosis prophylaxis heparin 5000 units subcutaneous.      MBB/MEDQ  D:  04/06/2004  T:  04/06/2004  Job:  [id number]

## 2010-10-25 LAB — BASIC METABOLIC PANEL
CO2: 28
Calcium: 9.2
GFR calc Af Amer: 50 — ABNORMAL LOW
GFR calc non Af Amer: 42 — ABNORMAL LOW
Sodium: 138

## 2010-10-25 LAB — POCT CARDIAC MARKERS
CKMB, poc: 1.1
Operator id: 237661
Troponin i, poc: <0.05

## 2010-10-25 LAB — CK TOTAL AND CKMB (NOT AT ARMC)
CK, MB: 1.3
Relative Index: 1.3

## 2010-10-25 LAB — CBC
Hemoglobin: 12.7 — ABNORMAL LOW
RBC: 4.07 — ABNORMAL LOW
WBC: 12.6 — ABNORMAL HIGH

## 2010-10-25 LAB — DIFFERENTIAL
Basophils Absolute: 0
Lymphocytes Relative: 17
Neutro Abs: 9.2 — ABNORMAL HIGH
Neutrophils Relative %: 73

## 2010-11-06 LAB — HEMOGLOBIN AND HEMATOCRIT, BLOOD: Hemoglobin: 12.2 — ABNORMAL LOW

## 2010-11-06 LAB — BASIC METABOLIC PANEL
BUN: 24 — ABNORMAL HIGH
Chloride: 107
Creatinine, Ser: 1.53 — ABNORMAL HIGH
GFR calc non Af Amer: 44 — ABNORMAL LOW
Glucose, Bld: 167 — ABNORMAL HIGH

## 2011-08-07 ENCOUNTER — Emergency Department (HOSPITAL_COMMUNITY)
Admission: EM | Admit: 2011-08-07 | Discharge: 2011-08-07 | Disposition: A | Discharge status: Home / Self Care | Payer: Medicare HMO | Attending: Emergency Medicine | Admitting: Emergency Medicine

## 2011-08-07 ENCOUNTER — Emergency Department (HOSPITAL_COMMUNITY): Payer: Medicare HMO

## 2011-08-07 ENCOUNTER — Encounter (HOSPITAL_COMMUNITY): Payer: Self-pay | Admitting: *Deleted

## 2011-08-07 DIAGNOSIS — Z79899 Other long term (current) drug therapy: Secondary | ICD-10-CM | POA: Insufficient documentation

## 2011-08-07 DIAGNOSIS — R059 Cough, unspecified: Secondary | ICD-10-CM | POA: Insufficient documentation

## 2011-08-07 DIAGNOSIS — R0789 Other chest pain: Secondary | ICD-10-CM

## 2011-08-07 DIAGNOSIS — J189 Pneumonia, unspecified organism: Secondary | ICD-10-CM

## 2011-08-07 DIAGNOSIS — R0989 Other specified symptoms and signs involving the circulatory and respiratory systems: Secondary | ICD-10-CM | POA: Insufficient documentation

## 2011-08-07 DIAGNOSIS — R0609 Other forms of dyspnea: Secondary | ICD-10-CM | POA: Insufficient documentation

## 2011-08-07 DIAGNOSIS — I10 Essential (primary) hypertension: Secondary | ICD-10-CM | POA: Insufficient documentation

## 2011-08-07 DIAGNOSIS — E119 Type 2 diabetes mellitus without complications: Secondary | ICD-10-CM | POA: Insufficient documentation

## 2011-08-07 DIAGNOSIS — R05 Cough: Secondary | ICD-10-CM | POA: Insufficient documentation

## 2011-08-07 HISTORY — DX: Essential (primary) hypertension: I10

## 2011-08-07 LAB — CBC WITH DIFFERENTIAL/PLATELET
Basophils Relative: 0 % (ref 0–1)
Eosinophils Absolute: 0.3 10*3/uL (ref 0.0–0.7)
Eosinophils Relative: 3 % (ref 0–5)
Hemoglobin: 12.8 g/dL — ABNORMAL LOW (ref 13.0–17.0)
MCH: 31.7 pg (ref 26.0–34.0)
MCHC: 32.9 g/dL (ref 30.0–36.0)
MCV: 96.3 fL (ref 78.0–100.0)
Monocytes Relative: 12 % (ref 3–12)
Neutrophils Relative %: 58 % (ref 43–77)

## 2011-08-07 LAB — COMPREHENSIVE METABOLIC PANEL
Albumin: 3.5 g/dL (ref 3.5–5.2)
Alkaline Phosphatase: 97 U/L (ref 39–117)
BUN: 37 mg/dL — ABNORMAL HIGH (ref 6–23)
Calcium: 9.5 mg/dL (ref 8.4–10.5)
Creatinine, Ser: 2.05 mg/dL — ABNORMAL HIGH (ref 0.50–1.35)
GFR calc Af Amer: 33 mL/min — ABNORMAL LOW (ref >90)
Glucose, Bld: 140 mg/dL — ABNORMAL HIGH (ref 70–99)
Potassium: 4.8 mEq/L (ref 3.5–5.1)
Total Protein: 7.1 g/dL (ref 6.0–8.3)

## 2011-08-07 LAB — CARDIAC PANEL(CRET KIN+CKTOT+MB+TROPI)
CK, MB: 1.7 ng/mL (ref 0.3–4.0)
Relative Index: INVALID (ref 0.0–2.5)
Total CK: 78 U/L (ref 7–232)
Troponin I: <0.3 ng/mL (ref <0.30)

## 2011-08-07 MED ORDER — AZITHROMYCIN 250 MG PO TABS: 250.0000 mg | ORAL_TABLET | Freq: Every day | ORAL | Status: DC

## 2011-08-07 MED ORDER — AZITHROMYCIN 250 MG PO TABS: 250.0000 mg | ORAL_TABLET | Freq: Every day | ORAL | Status: AC

## 2011-08-07 MED ORDER — AZITHROMYCIN 250 MG PO TABS
500.0000 mg | ORAL_TABLET | Freq: Once | ORAL | Status: AC
  Administered 2011-08-07: 500 mg via ORAL
  Filled 2011-08-07: qty 2

## 2011-08-07 NOTE — ED Notes (Signed)
Discharge instructions reviewed with pt, questions answered. Pt verbalized understanding.  

## 2011-08-07 NOTE — ED Notes (Signed)
Pt c/o chest pain x 2 days ago. Pt states pain worsens  with movement

## 2011-08-07 NOTE — ED Provider Notes (Signed)
History   This chart was scribed for No att. providers found by [employer]. The patient was seen in room APA10/APA10 and the patient's care was started at 10:13 PM    CSN: [id number]  Arrival date & time 08/07/11  2046   None     Chief Complaint  Patient presents with  . Chest Pain    (Consider location/radiation/quality/duration/timing/severity/associated sxs/prior treatment) HPI  Mike Briggs is a 78 y.o. male who presents to the Emergency Department complaining of severe, intermittent chest pain onset two days ago with associated symptoms of cough (non productive), difficulty breathing. Modifying factors include deep breathing which intensifies the pain, coughing which intensifies the pain. Pt has a hx of ruptured disk, diabetes, hypertension, gout.   Pt denies heart disease.   PCP is [facility]   Past Medical History  Diagnosis Date  . Diabetes mellitus   . Hypertension     Past Surgical History  Procedure Date  . Back surgery     No family history on file.  History  Substance Use Topics  . Smoking status: Former Smoker    Quit date: 08/07/1967  . Smokeless tobacco: Not on file  . Alcohol Use: No      Review of Systems  All other systems reviewed and are negative.    10 Systems reviewed and all are negative for acute change except as noted in the HPI.    Allergies  Review of patient's allergies indicates no known allergies.  Home Medications   Current Outpatient Rx  Name Route Sig Dispense Refill  . COLCHICINE 0.6 MG PO TABS Oral Take 0.6 mg by mouth daily.    Marland Kitchen GLIPIZIDE 5 MG PO TABS Oral Take 5 mg by mouth daily.    Marland Kitchen LISINOPRIL 40 MG PO TABS Oral Take 40 mg by mouth daily.    Marland Kitchen MECLIZINE HCL 25 MG PO TABS Oral Take 25 mg by mouth daily.    Marland Kitchen METFORMIN HCL 500 MG PO TABS Oral Take 500-1,000 mg by mouth 2 (two) times daily. Take two tablets in the morning and one tablet at bedtime    . METOPROLOL TARTRATE 25 MG PO TABS Oral Take  25 mg by mouth daily.    . TRIAMTERENE-HCTZ 37.5-25 MG PO TABS Oral Take 1 tablet by mouth daily.      SpO2 100%  Physical Exam  Nursing note and vitals reviewed. Constitutional: He is oriented to person, place, and time. He appears well-developed and well-nourished.  HENT:  Head: Atraumatic.  Right Ear: External ear normal.  Left Ear: External ear normal.  Nose: Nose normal.  Cardiovascular: Normal rate, regular rhythm, normal heart sounds and intact distal pulses.   No murmur heard. Pulmonary/Chest: Effort normal and breath sounds normal. He has no wheezes.  Abdominal: Soft. There is no tenderness.  Musculoskeletal: Normal range of motion. He exhibits no edema.  Neurological: He is alert and oriented to person, place, and time.  Skin: Skin is warm and dry.  Psychiatric: He has a normal mood and affect. His behavior is normal.    ED Course  Procedures (including critical care time)  DIAGNOSTIC STUDIES: Oxygen Saturation is 100% on room air, normal by my interpretation.    COORDINATION OF CARE:  10:19PM EDP at bedside discusses treatment plan concerning EKG, chest x-ray.    Results for orders placed during the hospital encounter of 08/07/11  CBC WITH DIFFERENTIAL      Component Value Range   WBC 9.8  4.0 - 10.5 K/uL   RBC 4.04 (*) 4.22 - 5.81 MIL/uL   Hemoglobin 12.8 (*) 13.0 - 17.0 g/dL   HCT 38.9 (*) 39.0 - 52.0 %   MCV 96.3  78.0 - 100.0 fL   MCH 31.7  26.0 - 34.0 pg   MCHC 32.9  30.0 - 36.0 g/dL   RDW 12.4  11.5 - 15.5 %   Platelets 229  150 - 400 K/uL   Neutrophils Relative 58  43 - 77 %   Neutro Abs 5.7  1.7 - 7.7 K/uL   Lymphocytes Relative 27  12 - 46 %   Lymphs Abs 2.7  0.7 - 4.0 K/uL   Monocytes Relative 12  3 - 12 %   Monocytes Absolute 1.1 (*) 0.1 - 1.0 K/uL   Eosinophils Relative 3  0 - 5 %   Eosinophils Absolute 0.3  0.0 - 0.7 K/uL   Basophils Relative 0  0 - 1 %   Basophils Absolute 0.0  0.0 - 0.1 K/uL   Dg Chest Port 1 View  08/07/2011   *RADIOLOGY REPORT*  Clinical Data: Chest pain for 2 days; history of diabetes and smoking.  PORTABLE CHEST - 1 VIEW  Comparison: Chest radiograph performed 06/12/2007  Findings: The lungs are mildly hypoexpanded; mild left midlung opacity could reflect mild pneumonia.  There is no evidence of pleural effusion or pneumothorax.  The cardiomediastinal silhouette is within normal limits.  No acute osseous abnormalities are seen.  IMPRESSION: Lungs mildly hypoexpanded; mild left midlung opacity could reflect mild pneumonia.  Original Report Authenticated By: Santa Lighter, M.D.      No diagnosis found.   Date: 08/07/2011  Rate: 92  Rhythm: normal sinus rhythm  QRS Axis: normal  Intervals: normal  ST/T Wave abnormalities: normal  Conduction Disutrbances:none  Narrative Interpretation:   Old EKG Reviewed: unchanged    MDM  The patient presents with a several day history of atypical chest pain.  The cardiac workup was unremarkable and the xray shows a pneumonia on the left.  He will be treated with zmax and discharged to home.  To return prn if he worsens.        I personally performed the services described in this documentation, which was scribed in my presence. The recorded information has been reviewed and considered.      Veryl Speak, MD 08/07/11 [phone]

## 2011-08-07 NOTE — ED Notes (Signed)
Pt co chest pain x 2 days that worsens with breathing and movement. Pt denies fever, N/V/D. Pt states has nonproductive cough from post nasal drip

## 2012-07-29 DIAGNOSIS — M6281 Muscle weakness (generalized): Secondary | ICD-10-CM

## 2012-08-04 DIAGNOSIS — M6281 Muscle weakness (generalized): Secondary | ICD-10-CM

## 2012-10-12 ENCOUNTER — Encounter (HOSPITAL_COMMUNITY): Payer: Self-pay

## 2012-10-12 ENCOUNTER — Emergency Department (HOSPITAL_COMMUNITY)
Admission: EM | Admit: 2012-10-12 | Discharge: 2012-10-12 | Disposition: A | Discharge status: Home / Self Care | Payer: PRIVATE HEALTH INSURANCE | Attending: Emergency Medicine | Admitting: Emergency Medicine

## 2012-10-12 ENCOUNTER — Emergency Department (HOSPITAL_COMMUNITY): Payer: PRIVATE HEALTH INSURANCE

## 2012-10-12 DIAGNOSIS — M7989 Other specified soft tissue disorders: Secondary | ICD-10-CM | POA: Diagnosis not present

## 2012-10-12 DIAGNOSIS — M549 Dorsalgia, unspecified: Secondary | ICD-10-CM | POA: Diagnosis not present

## 2012-10-12 DIAGNOSIS — R42 Dizziness and giddiness: Secondary | ICD-10-CM | POA: Diagnosis not present

## 2012-10-12 DIAGNOSIS — M79609 Pain in unspecified limb: Secondary | ICD-10-CM | POA: Diagnosis not present

## 2012-10-12 DIAGNOSIS — R109 Unspecified abdominal pain: Secondary | ICD-10-CM

## 2012-10-12 DIAGNOSIS — Z79899 Other long term (current) drug therapy: Secondary | ICD-10-CM | POA: Insufficient documentation

## 2012-10-12 DIAGNOSIS — Z87891 Personal history of nicotine dependence: Secondary | ICD-10-CM | POA: Insufficient documentation

## 2012-10-12 DIAGNOSIS — I10 Essential (primary) hypertension: Secondary | ICD-10-CM | POA: Diagnosis not present

## 2012-10-12 DIAGNOSIS — E119 Type 2 diabetes mellitus without complications: Secondary | ICD-10-CM | POA: Diagnosis not present

## 2012-10-12 DIAGNOSIS — R110 Nausea: Secondary | ICD-10-CM | POA: Diagnosis not present

## 2012-10-12 LAB — URINALYSIS, ROUTINE W REFLEX MICROSCOPIC
Bilirubin Urine: NEGATIVE
Glucose, UA: NEGATIVE mg/dL
Hgb urine dipstick: NEGATIVE
Ketones, ur: NEGATIVE mg/dL
Leukocytes, UA: NEGATIVE
Nitrite: NEGATIVE
Protein, ur: NEGATIVE mg/dL
Specific Gravity, Urine: 1.015 (ref 1.005–1.030)
Urobilinogen, UA: 0.2 mg/dL (ref 0.0–1.0)
pH: 7 (ref 5.0–8.0)

## 2012-10-12 LAB — BASIC METABOLIC PANEL
BUN: 13 mg/dL (ref 6–23)
CO2: 27 mEq/L (ref 19–32)
Calcium: 8.7 mg/dL (ref 8.4–10.5)
Chloride: 101 mEq/L (ref 96–112)
Creatinine, Ser: 1.24 mg/dL (ref 0.50–1.35)
GFR calc Af Amer: 61 mL/min — ABNORMAL LOW (ref >90)
GFR calc non Af Amer: 52 mL/min — ABNORMAL LOW (ref >90)
Glucose, Bld: 176 mg/dL — ABNORMAL HIGH (ref 70–99)
Potassium: 3.8 mEq/L (ref 3.5–5.1)
Sodium: 136 mEq/L (ref 135–145)

## 2012-10-12 LAB — CBC WITH DIFFERENTIAL/PLATELET
Basophils Absolute: 0 10*3/uL (ref 0.0–0.1)
Basophils Relative: 0 % (ref 0–1)
Eosinophils Absolute: 0.3 10*3/uL (ref 0.0–0.7)
Eosinophils Relative: 3 % (ref 0–5)
HCT: 35 % — ABNORMAL LOW (ref 39.0–52.0)
Hemoglobin: 11.3 g/dL — ABNORMAL LOW (ref 13.0–17.0)
Lymphocytes Relative: 22 % (ref 12–46)
Lymphs Abs: 2 10*3/uL (ref 0.7–4.0)
MCH: 30 pg (ref 26.0–34.0)
MCHC: 32.3 g/dL (ref 30.0–36.0)
MCV: 92.8 fL (ref 78.0–100.0)
Monocytes Absolute: 0.7 10*3/uL (ref 0.1–1.0)
Monocytes Relative: 7 % (ref 3–12)
Neutro Abs: 6.4 10*3/uL (ref 1.7–7.7)
Neutrophils Relative %: 69 % (ref 43–77)
Platelets: 220 10*3/uL (ref 150–400)
RBC: 3.77 MIL/uL — ABNORMAL LOW (ref 4.22–5.81)
RDW: 13.9 % (ref 11.5–15.5)
WBC: 9.3 10*3/uL (ref 4.0–10.5)

## 2012-10-12 MED ORDER — ONDANSETRON HCL 4 MG/2ML IJ SOLN
4.0000 mg | Freq: Once | INTRAMUSCULAR | Status: AC
  Administered 2012-10-12: 4 mg via INTRAVENOUS
  Filled 2012-10-12: qty 2

## 2012-10-12 MED ORDER — KETOROLAC TROMETHAMINE 30 MG/ML IJ SOLN
15.0000 mg | Freq: Once | INTRAMUSCULAR | Status: AC
  Administered 2012-10-12: 15 mg via INTRAVENOUS
  Filled 2012-10-12: qty 1

## 2012-10-12 MED ORDER — SODIUM CHLORIDE 0.9 % IV BOLUS (SEPSIS)
1000.0000 mL | Freq: Once | INTRAVENOUS | Status: AC
  Administered 2012-10-12: 1000 mL via INTRAVENOUS

## 2012-10-12 MED ORDER — TRAMADOL HCL 50 MG PO TABS: 50.0000 mg | ORAL_TABLET | Freq: Four times a day (QID) | ORAL | Status: DC | PRN

## 2012-10-12 MED ORDER — MORPHINE SULFATE 4 MG/ML IJ SOLN
4.0000 mg | Freq: Once | INTRAMUSCULAR | Status: AC
  Administered 2012-10-12: 4 mg via INTRAVENOUS
  Filled 2012-10-12: qty 1

## 2012-10-12 NOTE — ED Provider Notes (Signed)
CSN: [id number]     Arrival date & time 10/12/12  1013 History  This chart was scribed for Virgel Manifold, MD by Maree Erie, ED Scribe. The patient was seen in room APA06/APA06. Patient's care was started at 10:29 AM.    Chief Complaint  Patient presents with  . Flank Pain    Patient is a 79 y.o. male presenting with flank pain. The history is provided by the patient and a caregiver. No language interpreter was used.  Flank Pain This is a new problem. The current episode started more than 1 week ago. Episode frequency: intermittent. The problem has been gradually worsening. Nothing aggravates the symptoms.    HPI Comments: Mike Briggs is a 79 y.o. male who presents to the Emergency Department complaining of intermittent, worsening, sharp left flank pain that began about 2-3 weeks ago. He complains of associated nausea and dizziness during the episodes of pain. He denies noticing anything that causes the pain. He denies any previous similar episodes of pain. He denies urinary incontinence or fevers. He has a past surgical history of cholecystectomy.  He has no history of kidney stones. He has a past medical history of gout in right ankle. He also has intermittent right sided foot pain and swelling that began about a year ago. He denies any recent falls, twisting of the ankle or injuries to the area.  Past Medical History  Diagnosis Date  . Diabetes mellitus   . Hypertension    Past Surgical History  Procedure Laterality Date  . Back surgery     No family history on file. History  Substance Use Topics  . Smoking status: Former Smoker    Quit date: 08/07/1967  . Smokeless tobacco: Not on file  . Alcohol Use: No    Review of Systems  Constitutional: Negative for fever.  Gastrointestinal: Positive for nausea.  Genitourinary: Positive for flank pain. Negative for difficulty urinating.  Musculoskeletal: Positive for back pain and arthralgias.  Neurological: Positive for  dizziness.  All other systems reviewed and are negative.    Allergies  Review of patient's allergies indicates no known allergies.  Home Medications   Current Outpatient Rx  Name  Route  Sig  Dispense  Refill  . colchicine 0.6 MG tablet   Oral   Take 0.6 mg by mouth daily.         Marland Kitchen glipiZIDE (GLUCOTROL) 5 MG tablet   Oral   Take 5 mg by mouth daily.         Marland Kitchen lisinopril (PRINIVIL,ZESTRIL) 40 MG tablet   Oral   Take 40 mg by mouth daily.         . meclizine (ANTIVERT) 25 MG tablet   Oral   Take 25 mg by mouth daily.         . metFORMIN (GLUCOPHAGE) 500 MG tablet   Oral   Take 500-1,000 mg by mouth 2 (two) times daily. Take two tablets in the morning and one tablet at bedtime         . metoprolol tartrate (LOPRESSOR) 25 MG tablet   Oral   Take 25 mg by mouth daily.         Marland Kitchen triamterene-hydrochlorothiazide (MAXZIDE-25) 37.5-25 MG per tablet   Oral   Take 1 tablet by mouth daily.          Triage Vitals: BP 180/85  Pulse 80  Temp(Src) 98.3 F (36.8 C) (Oral)  Resp 20  Ht 5\' 11"  (1.803 m)  Wt  230 lb (104.327 kg)  BMI 32.09 kg/m2  SpO2 98%  Physical Exam  Nursing note and vitals reviewed. Constitutional: He is oriented to person, place, and time. He appears well-developed and well-nourished.  HENT:  Head: Normocephalic and atraumatic.  Neck: Neck supple.  Cardiovascular: Normal rate and regular rhythm.   No murmur heard. Pulmonary/Chest: Effort normal and breath sounds normal. No respiratory distress.  Abdominal: Soft. He exhibits no distension. There is no tenderness.  Musculoskeletal: Normal range of motion.  Mild swelling of right foot and ankle. Tenderness to palpation over medial and lateral malleolus. No concerning skin lesions. No increased warmth.   Neurological: He is alert and oriented to person, place, and time. No cranial nerve deficit.  NVI intact distally from ankle.  Psychiatric: He has a normal mood and affect. His behavior is  normal.    ED Course  Procedures (including critical care time)  DIAGNOSTIC STUDIES: Oxygen Saturation is 98% on room air, normal by my interpretation.    COORDINATION OF CARE:  10:38 AM -Clinical suspicion of kidney stones. Will order Abdomen CT, CBC CMP, and Urinalysis. Will order morphine injection, Toradol injection, IV fluids, and Zofran injection. Patient verbalizes understanding and agrees with treatment plan.    Labs Review  Labs Reviewed  CBC WITH DIFFERENTIAL - Abnormal; Notable for the following:    RBC 3.77 (*)    Hemoglobin 11.3 (*)    HCT 35.0 (*)    All other components within normal limits  BASIC METABOLIC PANEL - Abnormal; Notable for the following:    Glucose, Bld 176 (*)    GFR calc non Af Amer 52 (*)    GFR calc Af Amer 61 (*)    All other components within normal limits  URINALYSIS, ROUTINE W REFLEX MICROSCOPIC - Abnormal; Notable for the following:    Color, Urine STRAW (*)    All other components within normal limits   Imaging Review  Ct Abdomen Pelvis Wo Contrast  10/12/2012   *RADIOLOGY REPORT*  Clinical Data: Bilateral flank pain, difficulty urinating  CT ABDOMEN AND PELVIS WITHOUT CONTRAST  Technique:  Multidetector CT imaging of the abdomen and pelvis was performed following the standard protocol without intravenous contrast.  Comparison: Renal ultrasound 04/06/2004; remote CT abdomen/pelvis 06/25/2003  Findings:  Lower Chest:  Mild dependent atelectasis versus chronic fibrotic change in the periphery of the lower lungs. 5 mm nodular opacity in the periphery of the inferior right middle lobe.  Visualized cardiac structures within normal limits for size.  No pericardial effusion.  Unremarkable distal thoracic esophagus.  Abdomen: Unenhanced CT was performed per clinician order.  Lack of IV contrast limits sensitivity and specificity, especially for evaluation of abdominal/pelvic solid viscera.  Within these limitations, unremarkable CT appearance of the  stomach, duodenum, spleen, adrenal glands and pancreas.  Normal hepatic contour and morphology.  Surgical changes of prior cholecystectomy.  No intra or extrahepatic biliary ductal dilatation.  Nonspecific perinephric renal stranding bilaterally.  No evidence of hydronephrosis or nephrolithiasis.  Low attenuation lesion in the interpolar right kidney is incompletely characterized in the absence of intravenous contrast material.  Normal-caliber large and small bowel throughout the abdomen.  No evidence of bowel obstruction.  If again colonic diverticular disease.  No focal bowel wall thickening.  Normal appendix in the right lower quadrant.  Fluid or suspicious adenopathy.  Pelvis: Bladder is mildly distended with urine.  The prostate gland is enlarged and rounded measuring 4.7 x 4.9 cm in transverse diameter.  Unremarkable seminal  vesicles. Incompletely imaged right hydrocele.  Bones: No acute fracture or aggressive appearing lytic or blastic osseous lesion.  Surgical changes of prior L3-L5 posterior lumbar interbody fusion multilevel left laminectomies.  No definite hardware complication identified.  Vascular: Atherosclerotic vascular calcifications without aneurysmal dilatation.  IMPRESSION:  1.  Distended bladder in the setting of prostatomegaly.  Query bladder outlet obstruction secondary to BPH.  2.  Nonspecific perinephric renal stranding bilaterally.  Recommend correlation with urinalysis to exclude underlying upper urinary tract infection.  3.  Low attenuation renal cortical lesions bilaterally are incompletely characterized in the absence of intravenous contrast material.  Statistically, these are highly likely benign cysts.  4. Indeterminate 5 mm right middle lobe pulmonary nodule.  If the patient is at high risk for bronchogenic carcinoma, follow-up chest CT at 6-12 months is recommended.  If the patient is at low risk for bronchogenic carcinoma, follow-up chest CT at 12 months is recommended.  This  recommendation follows the consensus statement: Guidelines for Management of Small Pulmonary Nodules Detected on CT Scans: A Statement from the [employer] as published in Radiology 2005; 237:395-400.  5.   Additional ancillary findings as above.   Original Report Authenticated By: Jacqulynn Cadet, M.D.    MDM   1. Left flank pain      I personally preformed the services scribed in my presence. The recorded information has been reviewed is accurate. Virgel Manifold, MD.    Virgel Manifold, MD 10/16/12 [phone]

## 2012-10-12 NOTE — ED Notes (Signed)
Complain of pain in left flank area that started this morning. Nausea at times

## 2012-10-20 ENCOUNTER — Emergency Department (HOSPITAL_COMMUNITY): Payer: PRIVATE HEALTH INSURANCE

## 2012-10-20 ENCOUNTER — Observation Stay (HOSPITAL_COMMUNITY)
Admission: EM | Admit: 2012-10-20 | Discharge: 2012-10-23 | Disposition: A | Discharge status: Skilled Nursing Facility | Payer: PRIVATE HEALTH INSURANCE | Attending: Internal Medicine | Admitting: Internal Medicine

## 2012-10-20 ENCOUNTER — Encounter (HOSPITAL_COMMUNITY): Payer: Self-pay | Admitting: *Deleted

## 2012-10-20 DIAGNOSIS — N183 Chronic kidney disease, stage 3 unspecified: Secondary | ICD-10-CM | POA: Insufficient documentation

## 2012-10-20 DIAGNOSIS — E119 Type 2 diabetes mellitus without complications: Secondary | ICD-10-CM | POA: Insufficient documentation

## 2012-10-20 DIAGNOSIS — G8929 Other chronic pain: Secondary | ICD-10-CM | POA: Insufficient documentation

## 2012-10-20 DIAGNOSIS — Z23 Encounter for immunization: Secondary | ICD-10-CM | POA: Insufficient documentation

## 2012-10-20 DIAGNOSIS — G20 Parkinson's disease: Secondary | ICD-10-CM | POA: Diagnosis present

## 2012-10-20 DIAGNOSIS — I672 Cerebral atherosclerosis: Secondary | ICD-10-CM | POA: Insufficient documentation

## 2012-10-20 DIAGNOSIS — R5381 Other malaise: Secondary | ICD-10-CM | POA: Insufficient documentation

## 2012-10-20 DIAGNOSIS — M545 Low back pain, unspecified: Secondary | ICD-10-CM

## 2012-10-20 DIAGNOSIS — F0150 Vascular dementia without behavioral disturbance: Secondary | ICD-10-CM | POA: Insufficient documentation

## 2012-10-20 DIAGNOSIS — I129 Hypertensive chronic kidney disease with stage 1 through stage 4 chronic kidney disease, or unspecified chronic kidney disease: Principal | ICD-10-CM | POA: Insufficient documentation

## 2012-10-20 DIAGNOSIS — G9340 Encephalopathy, unspecified: Secondary | ICD-10-CM | POA: Diagnosis present

## 2012-10-20 DIAGNOSIS — E1122 Type 2 diabetes mellitus with diabetic chronic kidney disease: Secondary | ICD-10-CM | POA: Diagnosis present

## 2012-10-20 DIAGNOSIS — R410 Disorientation, unspecified: Secondary | ICD-10-CM

## 2012-10-20 DIAGNOSIS — G20A1 Parkinson's disease without dyskinesia, without mention of fluctuations: Secondary | ICD-10-CM | POA: Insufficient documentation

## 2012-10-20 DIAGNOSIS — R531 Weakness: Secondary | ICD-10-CM

## 2012-10-20 DIAGNOSIS — I10 Essential (primary) hypertension: Secondary | ICD-10-CM

## 2012-10-20 LAB — GLUCOSE, CAPILLARY: Glucose-Capillary: 172 mg/dL — ABNORMAL HIGH (ref 70–99)

## 2012-10-20 LAB — URINALYSIS, ROUTINE W REFLEX MICROSCOPIC
Bilirubin Urine: NEGATIVE
Leukocytes, UA: NEGATIVE
Nitrite: NEGATIVE
Specific Gravity, Urine: 1.02 (ref 1.005–1.030)
pH: 6 (ref 5.0–8.0)

## 2012-10-20 LAB — BASIC METABOLIC PANEL
BUN: 18 mg/dL (ref 6–23)
Chloride: 99 mEq/L (ref 96–112)
GFR calc Af Amer: 53 mL/min — ABNORMAL LOW (ref >90)
Potassium: 4.2 mEq/L (ref 3.5–5.1)
Sodium: 136 mEq/L (ref 135–145)

## 2012-10-20 LAB — CBC WITH DIFFERENTIAL/PLATELET
Basophils Relative: 0 % (ref 0–1)
Eosinophils Absolute: 0.1 10*3/uL (ref 0.0–0.7)
Hemoglobin: 11.3 g/dL — ABNORMAL LOW (ref 13.0–17.0)
Lymphocytes Relative: 18 % (ref 12–46)
MCHC: 31.7 g/dL (ref 30.0–36.0)
Neutrophils Relative %: 71 % (ref 43–77)
RBC: 3.84 MIL/uL — ABNORMAL LOW (ref 4.22–5.81)

## 2012-10-20 LAB — URINE MICROSCOPIC-ADD ON

## 2012-10-20 MED ORDER — SODIUM CHLORIDE 0.9 % IJ SOLN
3.0000 mL | Freq: Two times a day (BID) | INTRAMUSCULAR | Status: DC
  Administered 2012-10-20 – 2012-10-23 (×6): 3 mL via INTRAVENOUS

## 2012-10-20 MED ORDER — ACETAMINOPHEN 650 MG RE SUPP: 650.0000 mg | Freq: Four times a day (QID) | RECTAL | Status: DC | PRN

## 2012-10-20 MED ORDER — SODIUM CHLORIDE 0.9 % IV SOLN: 250.0000 mL | INTRAVENOUS | Status: DC | PRN

## 2012-10-20 MED ORDER — INSULIN ASPART 100 UNIT/ML ~~LOC~~ SOLN
0.0000 [IU] | Freq: Three times a day (TID) | SUBCUTANEOUS | Status: DC
  Administered 2012-10-21 (×2): 2 [IU] via SUBCUTANEOUS
  Administered 2012-10-22 – 2012-10-23 (×3): 1 [IU] via SUBCUTANEOUS
  Administered 2012-10-23: 3 [IU] via SUBCUTANEOUS

## 2012-10-20 MED ORDER — ASPIRIN EC 81 MG PO TBEC
81.0000 mg | DELAYED_RELEASE_TABLET | Freq: Every day | ORAL | Status: DC
  Administered 2012-10-20 – 2012-10-23 (×4): 81 mg via ORAL
  Filled 2012-10-20 (×4): qty 1

## 2012-10-20 MED ORDER — COLCHICINE 0.6 MG PO TABS
0.6000 mg | ORAL_TABLET | Freq: Every day | ORAL | Status: DC
  Administered 2012-10-21 – 2012-10-23 (×3): 0.6 mg via ORAL
  Filled 2012-10-20 (×3): qty 1

## 2012-10-20 MED ORDER — POLYETHYLENE GLYCOL 3350 17 G PO PACK: 17.0000 g | PACK | Freq: Every day | ORAL | Status: DC | PRN

## 2012-10-20 MED ORDER — SODIUM CHLORIDE 0.9 % IJ SOLN
3.0000 mL | Freq: Two times a day (BID) | INTRAMUSCULAR | Status: DC
Start: 2012-10-20 — End: 2012-10-22

## 2012-10-20 MED ORDER — DOCUSATE SODIUM 100 MG PO CAPS
100.0000 mg | ORAL_CAPSULE | Freq: Two times a day (BID) | ORAL | Status: DC
  Administered 2012-10-20 – 2012-10-23 (×6): 100 mg via ORAL
  Filled 2012-10-20 (×6): qty 1

## 2012-10-20 MED ORDER — HYDRALAZINE HCL 20 MG/ML IJ SOLN
5.0000 mg | Freq: Four times a day (QID) | INTRAMUSCULAR | Status: DC | PRN
  Administered 2012-10-21 – 2012-10-23 (×3): 5 mg via INTRAVENOUS
  Filled 2012-10-20 (×4): qty 1

## 2012-10-20 MED ORDER — TRAMADOL HCL 50 MG PO TABS
50.0000 mg | ORAL_TABLET | Freq: Four times a day (QID) | ORAL | Status: DC | PRN
  Administered 2012-10-21 – 2012-10-23 (×2): 50 mg via ORAL
  Filled 2012-10-20 (×2): qty 1

## 2012-10-20 MED ORDER — LISINOPRIL 10 MG PO TABS
40.0000 mg | ORAL_TABLET | Freq: Every day | ORAL | Status: DC
  Administered 2012-10-21 – 2012-10-23 (×3): 40 mg via ORAL
  Filled 2012-10-20 (×3): qty 4

## 2012-10-20 MED ORDER — GLIPIZIDE ER 2.5 MG PO TB24
2.5000 mg | ORAL_TABLET | Freq: Every day | ORAL | Status: DC
  Administered 2012-10-21 – 2012-10-23 (×3): 2.5 mg via ORAL
  Filled 2012-10-20 (×5): qty 1

## 2012-10-20 MED ORDER — ENOXAPARIN SODIUM 40 MG/0.4ML ~~LOC~~ SOLN
40.0000 mg | SUBCUTANEOUS | Status: DC
  Administered 2012-10-20 – 2012-10-22 (×3): 40 mg via SUBCUTANEOUS
  Filled 2012-10-20 (×3): qty 0.4

## 2012-10-20 MED ORDER — ONDANSETRON HCL 4 MG PO TABS: 4.0000 mg | ORAL_TABLET | Freq: Four times a day (QID) | ORAL | Status: DC | PRN

## 2012-10-20 MED ORDER — SODIUM CHLORIDE 0.9 % IV SOLN
INTRAVENOUS | Status: DC
  Administered 2012-10-20: 18:00:00 via INTRAVENOUS

## 2012-10-20 MED ORDER — SODIUM CHLORIDE 0.9 % IJ SOLN: 3.0000 mL | INTRAMUSCULAR | Status: DC | PRN

## 2012-10-20 MED ORDER — ONDANSETRON HCL 4 MG/2ML IJ SOLN: 4.0000 mg | Freq: Four times a day (QID) | INTRAMUSCULAR | Status: DC | PRN

## 2012-10-20 MED ORDER — ATORVASTATIN CALCIUM 10 MG PO TABS
10.0000 mg | ORAL_TABLET | Freq: Every day | ORAL | Status: DC
  Administered 2012-10-20 – 2012-10-22 (×3): 10 mg via ORAL
  Filled 2012-10-20 (×3): qty 1

## 2012-10-20 MED ORDER — INFLUENZA VAC SPLIT QUAD 0.5 ML IM SUSP
0.5000 mL | INTRAMUSCULAR | Status: AC
  Administered 2012-10-21: 0.5 mL via INTRAMUSCULAR
  Filled 2012-10-20: qty 0.5

## 2012-10-20 MED ORDER — SENNA 8.6 MG PO TABS
1.0000 | ORAL_TABLET | Freq: Two times a day (BID) | ORAL | Status: DC
  Administered 2012-10-20 – 2012-10-23 (×6): 8.6 mg via ORAL
  Filled 2012-10-20 (×6): qty 1

## 2012-10-20 MED ORDER — METOPROLOL TARTRATE 1 MG/ML IV SOLN
5.0000 mg | Freq: Once | INTRAVENOUS | Status: AC
  Administered 2012-10-20: 5 mg via INTRAVENOUS
  Filled 2012-10-20: qty 5

## 2012-10-20 MED ORDER — ALUM & MAG HYDROXIDE-SIMETH 200-200-20 MG/5ML PO SUSP: 30.0000 mL | Freq: Four times a day (QID) | ORAL | Status: DC | PRN

## 2012-10-20 MED ORDER — ACETAMINOPHEN 325 MG PO TABS: 650.0000 mg | ORAL_TABLET | Freq: Four times a day (QID) | ORAL | Status: DC | PRN

## 2012-10-20 MED ORDER — METOPROLOL TARTRATE 25 MG PO TABS
25.0000 mg | ORAL_TABLET | Freq: Every day | ORAL | Status: DC
  Administered 2012-10-21: 25 mg via ORAL
  Filled 2012-10-20: qty 1

## 2012-10-20 NOTE — ED Notes (Signed)
Per EMS - sudden onset of generalized weakness and hypertension starting this morning.  CBG in route 97.  Pt alert and oriented x 4 at this time.
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
## 2012-10-20 NOTE — H&P (Signed)
History and Physical  Mike Briggs [id number] DOB: [birth year] DOA: 10/20/2012  Referring physician: Dr. Rogene Houston PCP: Glenda Chroman., MD   Chief Complaint: Generalized weakness, hypertension  HPI:  79 year old man presented to the emergency department with generalized weakness, onset this morning. Initial evaluation was notable for malignant hypertension but otherwise unremarkable. Patient developed mild confusion and EDP referred for observation.  History obtained from patient, no family members or aide present. However, patient is alert and oriented. His history is somewhat vague. He reports chronic low back pain, chronic bilateral shoulder pain and he ambulates with a walker and has been undergoing physical therapy as an outpatient. He has a history of both cervical and lumbar spine surgery. He reports today he had difficulty getting out of bed secondary to generalized weakness. He had was especially weak in his lower legs. He has chronic difficulty with his shoulders. He may have had some weakness in his right leg but it is not clear. He reports he took his blood pressure medications today. He reports chronic urinary incontinence. He reports constipation. He notes no other focal neurologic deficits.  In the emergency department he was noted to be quite hypertensive. He developed some mild confusion by report from the emergency department physician and so observation was requested. No focal deficits were noted in the emergency department. Chemistry panel unremarkable. CBC notable for mild leukocytosis. Urinalysis, head CT and chest x-ray unremarkable. EKG nonacute.  Review of Systems:  Negative for fever, rash, chest pain, SOB, dysuria, bleeding, n/v/abdominal pain.  Positive for chronic visual changes, sore throat, chronic bilateral shoulder pain, back pain, chronic urinary urgency/incontinence, constipation  Past Medical History  Diagnosis Date  . Diabetes mellitus   . Hypertension      Past Surgical History  Procedure Laterality Date  . Back surgery      cervical and lumbar    Social History:  reports that he quit smoking about 45 years ago. He does not have any smokeless tobacco history on file. He reports that he does not drink alcohol or use illicit drugs.  No Known Allergies  No family history on file. Patient denies any particular family medical problems.  Prior to Admission medications   Medication Sig Start Date End Date Taking? Authorizing Provider  acetaminophen (TYLENOL) 500 MG tablet Take 500-1,000 mg by mouth every 6 (six) hours as needed for pain.   Yes Historical Provider, MD  atorvastatin (LIPITOR) 10 MG tablet Take 10 mg by mouth daily after supper.   Yes Historical Provider, MD  colchicine 0.6 MG tablet Take 0.6 mg by mouth daily.   Yes Historical Provider, MD  glipiZIDE (GLUCOTROL XL) 2.5 MG 24 hr tablet Take 2.5 mg by mouth daily.   Yes Historical Provider, MD  lisinopril (PRINIVIL,ZESTRIL) 40 MG tablet Take 40 mg by mouth daily.   Yes Historical Provider, MD  metoprolol tartrate (LOPRESSOR) 25 MG tablet Take 25 mg by mouth daily.   Yes Historical Provider, MD  traMADol (ULTRAM) 50 MG tablet Take 1 tablet (50 mg total) by mouth every 6 (six) hours as needed for pain. 10/12/12  Yes Virgel Manifold, MD   Physical Exam: Filed Vitals:   10/20/12 1533 10/20/12 1725 10/20/12 1736 10/20/12 1750  BP: 195/100 181/84 189/87 189/87  Pulse: 76   80  Temp:      TempSrc:      Resp: 16 20 20 18   Height:      Weight:      SpO2: 98% 96% 96% 97%  General: Examined in the emergency department. Appears calm and comfortable Eyes: Right pupil somewhat eccentric. Both pupils reactive to light, similar in size. normal lids, irises & conjunctiva ENT: grossly normal hearing, lips & tongue Neck: no LAD, masses or thyromegaly Cardiovascular: RRR, no m/r/g. No LE edema. Respiratory: CTA bilaterally, no w/r/r. Normal respiratory effort. Abdomen: soft, ntnd Skin:  no rash or induration seen  Musculoskeletal: grossly normal tone BUE/BLE. Strength 4+/5 bilateral lower extremities and symmetric. Able to lift both legs off the bed without difficulty. Range of movement limited in both shoulders but grip strength is excellent 5/5 bilaterally. Able to move both arms without difficulty, limited by pain. Psychiatric: grossly normal mood and affect, speech fluent and appropriate. Oriented to self, hospital, month, year. Neurologic: Cranial nerves 2-12 appear intact except as noted above. No dysdiadochokinesis but testing limited secondary to shoulder pain.  Wt Readings from Last 3 Encounters:  10/20/12 105.235 kg (232 lb)  10/12/12 104.327 kg (230 lb)    Labs on Admission:  Basic Metabolic Panel:  Recent Labs Lab 10/20/12 1338  NA 136  K 4.2  CL 99  CO2 29  GLUCOSE 195*  BUN 18  CREATININE 1.39*  CALCIUM 9.2   CBC:  Recent Labs Lab 10/20/12 1338  WBC 11.5*  NEUTROABS 8.1*  HGB 11.3*  HCT 35.6*  MCV 92.7  PLT 220   CBG:  Recent Labs Lab 10/20/12 1404  GLUCAP 172*     Radiological Exams on Admission: Dg Chest 1 View  10/20/2012   CLINICAL DATA:  AMS. Unable to walk.  EXAM: CHEST - 1 VIEW  COMPARISON:  07/29/2012  FINDINGS: Heart size is accentuated by the AP position of the patient. Small right pleural effusion is suspected. There are no focal consolidations. No pulmonary edema. Degenerative changes are seen in the spine.  IMPRESSION: 1. Suspect small right pleural effusion. 2.  No focal pulmonary consolidation.   Electronically Signed   By: Shon Hale M.D.   On: 10/20/2012 14:42   Ct Head Wo Contrast  10/20/2012   CLINICAL DATA:  Generalized weakness. History of hypertension, diabetes.  EXAM: CT HEAD WITHOUT CONTRAST  TECHNIQUE: Contiguous axial images were obtained from the base of the skull through the vertex without intravenous contrast.  COMPARISON:  08/05/2012  FINDINGS: There is moderate central and cortical atrophy.  Periventricular white matter changes are consistent with small vessel disease. There is no evidence for hemorrhage, mass lesion, or acute infarction. Bone windows are unremarkable.  IMPRESSION: 1. Atrophy and small vessel disease. 2.  No evidence for acute intracranial abnormality.   Electronically Signed   By: Shon Hale M.D.   On: 10/20/2012 14:37    EKG: Independently reviewed. Normal sinus rhythm. No acute changes.   Principal Problem:   Malignant hypertension Active Problems:   DIABETES   Generalized weakness   Acute encephalopathy   Chronic kidney disease, stage III (moderate)   Chronic low back pain   Assessment/Plan 79 year old man presents with somewhat vague history of generalized weakness superimposed on chronic pain and weakness. Objective findings notable for hypertension. No focal neurologic deficits noted. Will observe, treat malignant hypertension, obtain physical therapy consultation and monitor for confusion, currently he has no signs of confusion. I suspect he is at baseline. I see no findings to suggest stroke or acute CNS process.   1. Malignant hypertension: Suspect patient did not take medications this morning. No focal neurologic deficits or signs of endorgan damage. Reinstitute home medications. IV PRN therapy. 2.  Generalized weakness: No focal neurologic deficits. Physical therapy consultation. I see no evidence of decreased grip strength on the left. 3. Possible acute encephalopathy: Baseline unknown, but he is currently alert and oriented. No signs or symptoms to suggest infection or stroke. Monitor clinically. 4. Chronic kidney disease stage III: Appears stable. 5. Diabetes mellitus type 2: Hold glipizide. Sliding-scale insulin. 6. Hyperlipidemia: Lipitor 7. Chronic low back pain: History of surgery 2006. Appears to be stable. No signs or symptoms to suggest acute issue. 8. History of gout: Quiescent. 9. Constipation: Bowel regimen. 10. Social: There has  been some problem with the aide at home.  Code Status: full code  DVT prophylaxis: Lovenox Family Communication: none present Disposition Plan/Anticipated LOS: observation, 1-2 days  Time spent: 62 minutes  Murray Hodgkins, MD  [facility] Pager [phone] 10/20/2012, 6:39 PM

## 2012-10-20 NOTE — ED Notes (Signed)
Spoke with Justus Memory, director of [facility], states is attempting to contact Pt's niece which is POA, states pt's eligibility for home health care terminates today.  Left contact info for any further questions [phone].

## 2012-10-20 NOTE — ED Notes (Signed)
Mike Briggs. Notified of B/P
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
## 2012-10-21 ENCOUNTER — Encounter (HOSPITAL_COMMUNITY): Payer: Self-pay

## 2012-10-21 DIAGNOSIS — F29 Unspecified psychosis not due to a substance or known physiological condition: Secondary | ICD-10-CM

## 2012-10-21 LAB — BASIC METABOLIC PANEL
CO2: 26 mEq/L (ref 19–32)
Calcium: 9.2 mg/dL (ref 8.4–10.5)
Creatinine, Ser: 1.58 mg/dL — ABNORMAL HIGH (ref 0.50–1.35)
GFR calc Af Amer: 45 mL/min — ABNORMAL LOW (ref >90)
GFR calc non Af Amer: 39 mL/min — ABNORMAL LOW (ref >90)

## 2012-10-21 LAB — GLUCOSE, CAPILLARY
Glucose-Capillary: 135 mg/dL — ABNORMAL HIGH (ref 70–99)
Glucose-Capillary: 187 mg/dL — ABNORMAL HIGH (ref 70–99)
Glucose-Capillary: 156 mg/dL — ABNORMAL HIGH (ref 70–99)
Glucose-Capillary: 127 mg/dL — ABNORMAL HIGH (ref 70–99)

## 2012-10-21 LAB — CBC
MCV: 92.5 fL (ref 78.0–100.0)
Platelets: 228 10*3/uL (ref 150–400)
RBC: 3.74 MIL/uL — ABNORMAL LOW (ref 4.22–5.81)
RDW: 14.1 % (ref 11.5–15.5)
WBC: 11.5 10*3/uL — ABNORMAL HIGH (ref 4.0–10.5)

## 2012-10-21 LAB — HEMOGLOBIN A1C
Hgb A1c MFr Bld: 6.4 % — ABNORMAL HIGH (ref <5.7)
Mean Plasma Glucose: 137 mg/dL — ABNORMAL HIGH (ref <117)

## 2012-10-21 MED ORDER — METOPROLOL TARTRATE 25 MG PO TABS
25.0000 mg | ORAL_TABLET | Freq: Two times a day (BID) | ORAL | Status: DC
  Administered 2012-10-21: 25 mg via ORAL
  Filled 2012-10-21 (×2): qty 1

## 2012-10-21 MED ORDER — PNEUMOCOCCAL VAC POLYVALENT 25 MCG/0.5ML IJ INJ
0.5000 mL | INJECTION | Freq: Once | INTRAMUSCULAR | Status: AC
  Administered 2012-10-21: 0.5 mL via INTRAMUSCULAR
  Filled 2012-10-21: qty 0.5

## 2012-10-21 NOTE — Progress Notes (Signed)
[facility] PROGRESS NOTE  Mike Briggs [id number] DOB: [birth year] DOA: 10/20/2012 PCP: Glenda Chroman., MD  Assessment/Plan: 1. Malignant hypertension. Likely secondary from patient not taking his medications yesterday. He tells me that his home aide told him he did not need to take some of his blood pressure medication. After restarting his oral antihypertensive agents, systolic blood pressures improving. Will increase his metoprolol dose to 50 mg daily. 2. Generalized weakness. Patient not showing focal deficits. He has not been out of bed since yesterday. Awaiting physical therapy consultation and evaluation. I think he would likely benefit from PT either at home or in the rehabilitation setting. 3. Stage III chronic kidney disease, with creatinine at 1.5. Will follow. 4. Type 2 diabetes mellitus. Patient on sliding scale insulin. Blood sugars well controlled. 5. Probable cognitive impairment. Patient having increased confusion yesterday, could be related to other blood pressures. Chest x-ray and a urinalysis did not reveal evidence of infection. He has been afebrile since presentation. We'll continue to monitor. 6. Constipation. Patient on bowel regimen.  Code Status: Full code Family Communication: Plan discussed with patient and family members present at bedside including daughter.  Disposition Plan: Physical therapy consultation    HPI/Subjective: Patient at yesterday, presenting with a generalized weakness. He was also found to be hypertensive, likely secondary to not taking his medications. Upon restarting his oral antihypertensive agents, blood pressures this morning are improved. He complains of ongoing generalized weakness. Awaiting a physical therapy consultation at the time of this dictation. Otherwise he is in no acute distress, reports ongoing right shoulder pain, which is chronic and attributes to his history of osteoarthritis arthritis.  Objective: Filed Vitals:   10/21/12 0554  BP: 157/112  Pulse: 96  Temp: 98.6 F (37 C)  Resp: 18    Intake/Output Summary (Last 24 hours) at 10/21/12 1249 Last data filed at 10/21/12 1122  Gross per 24 hour  Intake      0 ml  Output    175 ml  Net   -175 ml   Filed Weights   10/20/12 1133  Weight: 105.235 kg (232 lb)    Exam: Conversive  General:  Patient is in no acute distress he is awake and alert. Conversive , reports ongoing generalized weakness.  Cardiovascular: Regular rate and rhythm normal S1-S2.I did not note extremity edema.   Respiratory: Normal respiratory effort, lungs are clear to auscultation , no wheezing rhonchi or rales   Abdomen: Soft nontender nondistended positive bowel sounds   Musculoskeletal: Patient reporting right shoulder pain with abduction  Neurological: Overall clear to 12 grossly intact, he has generalized weakness, without focal findings.  Data Reviewed: Basic Metabolic Panel:  Recent Labs Lab 10/20/12 1338 10/21/12 0613  NA 136 137  K 4.2 3.6  CL 99 100  CO2 29 26  GLUCOSE 195* 130*  BUN 18 17  CREATININE 1.39* 1.58*  CALCIUM 9.2 9.2   Liver Function Tests: No results found for this basename: AST, ALT, ALKPHOS, BILITOT, PROT, ALBUMIN,  in the last 168 hours No results found for this basename: LIPASE, AMYLASE,  in the last 168 hours No results found for this basename: AMMONIA,  in the last 168 hours CBC:  Recent Labs Lab 10/20/12 1338 10/21/12 0613  WBC 11.5* 11.5*  NEUTROABS 8.1*  --   HGB 11.3* 11.1*  HCT 35.6* 34.6*  MCV 92.7 92.5  PLT 220 228   Cardiac Enzymes: No results found for this basename: CKTOTAL, CKMB, CKMBINDEX, TROPONINI,  in the  last 168 hours BNP (last 3 results) No results found for this basename: PROBNP,  in the last 8760 hours CBG:  Recent Labs Lab 10/20/12 1404 10/21/12 0751 10/21/12 1141  GLUCAP 172* 135* 187*    No results found for this or any previous visit (from the past 240 hour(s)).   Studies: Dg  Chest 1 View  10/20/2012   CLINICAL DATA:  AMS. Unable to walk.  EXAM: CHEST - 1 VIEW  COMPARISON:  07/29/2012  FINDINGS: Heart size is accentuated by the AP position of the patient. Small right pleural effusion is suspected. There are no focal consolidations. No pulmonary edema. Degenerative changes are seen in the spine.  IMPRESSION: 1. Suspect small right pleural effusion. 2.  No focal pulmonary consolidation.   Electronically Signed   By: Shon Hale M.D.   On: 10/20/2012 14:42   Ct Head Wo Contrast  10/20/2012   CLINICAL DATA:  Generalized weakness. History of hypertension, diabetes.  EXAM: CT HEAD WITHOUT CONTRAST  TECHNIQUE: Contiguous axial images were obtained from the base of the skull through the vertex without intravenous contrast.  COMPARISON:  08/05/2012  FINDINGS: There is moderate central and cortical atrophy. Periventricular white matter changes are consistent with small vessel disease. There is no evidence for hemorrhage, mass lesion, or acute infarction. Bone windows are unremarkable.  IMPRESSION: 1. Atrophy and small vessel disease. 2.  No evidence for acute intracranial abnormality.   Electronically Signed   By: Shon Hale M.D.   On: 10/20/2012 14:37    Scheduled Meds: . aspirin EC  81 mg Oral Daily  . atorvastatin  10 mg Oral QPC supper  . colchicine  0.6 mg Oral Daily  . docusate sodium  100 mg Oral BID  . enoxaparin (LOVENOX) injection  40 mg Subcutaneous Q24H  . glipiZIDE  2.5 mg Oral QAC breakfast  . insulin aspart  0-9 Units Subcutaneous TID WC  . lisinopril  40 mg Oral Daily  . metoprolol tartrate  25 mg Oral Daily  . pneumococcal 23 valent vaccine  0.5 mL Intramuscular Once  . senna  1 tablet Oral BID  . sodium chloride  3 mL Intravenous Q12H  . sodium chloride  3 mL Intravenous Q12H   Continuous Infusions:   Principal Problem:   Malignant hypertension Active Problems:   DIABETES   Generalized weakness   Acute encephalopathy   Chronic kidney disease, stage  III (moderate)   Chronic low back pain    Time spent: 35 minutes    Kelvin Cellar  [facility] Pager [phone] PM-7AM, please contact night-coverage at [email or web address], password [facility] 10/21/2012, 12:49 PM  LOS: 1 day

## 2012-10-21 NOTE — Care Management Note (Signed)
    Page 1 of 1   10/23/2012     1:14:14 PM   CARE MANAGEMENT NOTE 10/23/2012  Patient:  Mike Briggs, Mike Briggs   Account Number:  [id number]  Date Initiated:  10/21/2012  Documentation initiated by:  Claretha Cooper  Subjective/Objective Assessment:   Pt from home with weakness and HPN. CM found pt confused. States he has a caregiver at home but unable to tell me her name.     Action/Plan:   Anticipated DC Date:  10/22/2012   Anticipated DC Plan:  SKILLED NURSING FACILITY  In-house referral  Clinical Social Worker      DC Planning Services  CM consult      Choice offered to / List presented to:             Status of service:  Completed, signed off Medicare Important Message given?   (If response is "NO", the following Medicare IM given date fields will be blank) Date Medicare IM given:   Date Additional Medicare IM given:    Discharge Disposition:  [place]  Per UR Regulation:    If discussed at Long Length of Stay Meetings, dates discussed:    Comments:  10/23/12 Claretha Cooper RN BSN CM Pt [place] to [facility]  10/21/12 Stacy Sailer RN BSN CM PT recommends SNF.

## 2012-10-21 NOTE — Clinical Social Work Psychosocial (Signed)
Clinical Social Work Department BRIEF PSYCHOSOCIAL ASSESSMENT 10/21/2012  Patient:  Mike Briggs, Mike Briggs     Account Number:  [id number]     Admit date:  10/20/2012  Clinical Social Worker:  Wyatt Haste  Date/Time:  10/21/2012 02:45 PM  Referred by:  CSW  Date Referred:  10/21/2012 Referred for  SNF Placement   Other Referral:   Interview type:  Patient Other interview type:   and daughter- Lattie Haw    PSYCHOSOCIAL DATA Living Status:  ALONE Admitted from facility:   Level of care:   Primary support name:  Floreen Comber Primary support relationship to patient:  FAMILY Degree of support available:   adequate    CURRENT CONCERNS Current Concerns  Post-Acute Placement   Other Concerns:    SOCIAL WORK ASSESSMENT / PLAN CSW met with pt and pt's daughter Lattie Haw at bedside. Pt alert and oriented at times, but had moments of blank stares as well during assessment. CSW would have to call his name again and he would respond. Then pt would appear dazed again and staring into space while slowly leaning to the side. CSW notified RN of above.  Pt lives alone. He has a CAP aid for 5 hours a day during the week, from 9-12 and then 4-6. Lattie Haw indicates she lives in [place] and is unable to check on him often. His HCPOA is his niece, Floreen Comber. Pt told CSW that this was his best support. She works until 6 in the evening. Pt was supposed to start PACE services today. He came to hospital due to sudden weakness. Pt explains that at baseline he is able to transfer to a wheelchair. Lattie Haw said he can do simple meal preparation. Pt states that suddenly he could not get up. He recently d/c from [facility] after completing a month of rehab. PT evaluated pt today and recommendation is to return to SNF. CSW discussed placement process. Pt is agreeable to [place] or [place], but does not want to return to [facility]. SNF list provided.   Assessment/plan status:  Psychosocial Support/Ongoing Assessment  of Needs Other assessment/ plan:   Information/referral to community resources:   SNF list    PATIENT'S/FAMILY'S RESPONSE TO PLAN OF CARE: Pt is willing to consider SNF again. His daughter is open to him coming to live with her, but pt has not been willing to go to [place]. CSW will fax out FL2 and follow up with bed offers.       Benay Pike, [place]

## 2012-10-21 NOTE — Progress Notes (Signed)
UR Chart Review Completed  

## 2012-10-21 NOTE — Evaluation (Signed)
Physical Therapy Evaluation Patient Details Name: Mike Briggs MRN: [id number] DOB: [birth year] Today's Date: 10/21/2012 Time: [id number] PT Time Calculation (min): 64 min  PT Assessment / Plan / Recommendation History of Present Illness   Pt was admitted due to sudden onset of LE weakness.  He was found to have malignant hypertension.  Pt described chronic shoulder pain and said that he now could not walk.  Per MD, he was alert and oriented.  Clinical Impression   Pt was seen for evaluation.  He was awake but with a totally flat affect.  He displayed periods of lucidity but then other times when he could not identify his daughter.  On MMT  Pt has severe weakness throughout his body.  He required total assist to transfer supine to sit and then needed maximal assist to maintain static sitting balance.  He requires a lift to now safely transfer bed to chair.  Per daughter and pt report, pt had been able to live alone with 5 hours of aide service/day.  Pt had been ambulatory with a walker and SBA.  This is a very significant change in his physical status.  I have spoken with RN about this and am recommending SNF at d/c.      PT Assessment  Patient needs continued PT services    Follow Up Recommendations  SNF    Does the patient have the potential to tolerate intense rehabilitation    no  Barriers to Discharge Decreased caregiver support lives in a handicap accessible apartment    Equipment Recommendations       Recommendations for Other Services OT consult   Frequency Min 3X/week    Precautions / Restrictions     Pertinent Vitals/Pain       Mobility  Bed Mobility Bed Mobility: Supine to Sit Supine to Sit: 1: +1 Total assist;HOB elevated Transfers Transfers: Sit to Stand;Squat Pivot Transfers Sit to Stand: 1: +2 Total assist Sit to Stand: Patient Percentage: 0% Squat Pivot Transfers: 1: +2 Total assist Squat Pivot Transfers: Patient Percentage: 0% Transfer via Lift  Equipment: Marketing executive Details for Transfer Assistance: pt is unable to stand...is a total lift Ambulation/Gait Ambulation/Gait Assistance: Not tested (comment) Stairs: No Wheelchair Mobility Wheelchair Mobility: No    Exercises     PT Diagnosis: Generalized weakness  PT Problem List: Decreased strength;Decreased range of motion;Decreased activity tolerance;Decreased balance;Decreased mobility;Cardiopulmonary status limiting activity;Obesity PT Treatment Interventions: Functional mobility training;Therapeutic activities;Therapeutic exercise     PT Goals(Current goals can be found in the care plan section) Acute Rehab PT Goals Patient Stated Goal: wants to return home PT Goal Formulation: With patient Time For Goal Achievement: 11/04/12 Potential to Achieve Goals: Fair  Visit Information  Last PT Received On: 10/21/12       Prior [place] expects to be discharged to:: Skilled nursing facility Additional Comments: Pt lived alone PTA, had an aide 3 hours in AM and 2 hours in PM.  His daughter is not familiar with how he was doing because she lives in [place] and has not been involved in his care. Prior Function Level of Independence: Needs assistance Gait / Transfers Assistance Needed: ambulated with walker and assist, transferred independently (per his report) ADL's / Homemaking Assistance Needed: aid assist needed for bathing, dressing and all household activities. Communication Communication: No difficulties    Cognition  Cognition Arousal/Alertness: Awake/alert Behavior During Therapy: Flat affect Overall Cognitive Status: Impaired/Different from baseline Area of Impairment: Memory;Awareness Awareness: Intellectual;Anticipatory General Comments:  Pt appears to be confused...stated that his daughter was not a family member, could not give pertinent facts about prior status.    Extremity/Trunk Assessment Lower Extremity Assessment Lower Extremity  Assessment: RLE deficits/detail;LLE deficits/detail RLE Deficits / Details: extreme weakness, generally 2-/5 with overall stiffness noted LLE Deficits / Details: extreme weakness with strength generally 2-/5, general stiffness throughout Cervical / Trunk Assessment Cervical / Trunk Assessment: Other exceptions Cervical / Trunk Exceptions: trunk stiffness with difficulty flexing the spine while seated...minimal active rotation of the neck   Balance Balance Balance Assessed: Yes Static Sitting Balance Static Sitting - Balance Support: No upper extremity supported;Feet supported Static Sitting - Level of Assistance: 2: Max assist (falls backward)  End of Session PT - End of Session Equipment Utilized During Treatment: Gait belt Activity Tolerance: Patient limited by fatigue Patient left: in chair;with call bell/phone within reach;with family/visitor present Nurse Communication: Mobility status;Need for lift equipment  GP     Sable Feil 10/21/2012, 2:35 PM

## 2012-10-21 NOTE — Clinical Social Work Placement (Signed)
Clinical Social Work Department CLINICAL SOCIAL WORK PLACEMENT NOTE 10/21/2012  Patient:  Mike Briggs, Mike Briggs  Account Number:  [id number] Admit date:  10/20/2012  Clinical Social Worker:  Benay Pike, LCSW  Date/time:  10/21/2012 02:40 PM  Clinical Social Work is seeking post-discharge placement for this patient at the following level of care:   SKILLED NURSING   (*CSW will update this form in Epic as items are completed)   10/21/2012  Patient/family provided with [place] Department of Clinical Social Work's list of facilities offering this level of care within the geographic area requested by the patient (or if unable, by the patient's family).  10/21/2012  Patient/family informed of their freedom to choose among providers that offer the needed level of care, that participate in Medicare, Medicaid or managed care program needed by the patient, have an available bed and are willing to accept the patient.  10/21/2012  Patient/family informed of [facility]' ownership interest in [facility], as well as of the fact that they are under no obligation to receive care at this facility.  PASARR submitted to [employer] on  PASARR number received from [place] on   FL2 transmitted to all facilities in geographic area requested by pt/family on  10/21/2012 FL2 transmitted to all facilities within larger geographic area on   Patient informed that his/her managed care company has contracts with or will negotiate with  certain facilities, including the following:     Patient/family informed of bed offers received:   Patient chooses bed at  Physician recommends and patient chooses bed at    Patient to be transferred to  on   Patient to be transferred to facility by   The following physician request were entered in Epic:   Additional Comments: Pt has existing pasarr.  Benay Pike, [place]

## 2012-10-22 ENCOUNTER — Observation Stay (HOSPITAL_COMMUNITY)
Admit: 2012-10-22 | Discharge: 2012-10-22 | Disposition: A | Discharge status: Home / Self Care | Payer: PRIVATE HEALTH INSURANCE | Attending: Neurology | Admitting: Neurology

## 2012-10-22 ENCOUNTER — Observation Stay (HOSPITAL_COMMUNITY): Payer: PRIVATE HEALTH INSURANCE

## 2012-10-22 LAB — BASIC METABOLIC PANEL
BUN: 18 mg/dL (ref 6–23)
CO2: 25 mEq/L (ref 19–32)
Chloride: 98 mEq/L (ref 96–112)
GFR calc Af Amer: 60 mL/min — ABNORMAL LOW (ref >90)
GFR calc non Af Amer: 51 mL/min — ABNORMAL LOW (ref >90)
Potassium: 4 mEq/L (ref 3.5–5.1)
Sodium: 135 mEq/L (ref 135–145)

## 2012-10-22 LAB — GLUCOSE, CAPILLARY
Glucose-Capillary: 133 mg/dL — ABNORMAL HIGH (ref 70–99)
Glucose-Capillary: 181 mg/dL — ABNORMAL HIGH (ref 70–99)

## 2012-10-22 LAB — HOMOCYSTEINE: Homocysteine: 12.4 umol/L (ref 4.0–15.4)

## 2012-10-22 MED ORDER — METOPROLOL TARTRATE 50 MG PO TABS
50.0000 mg | ORAL_TABLET | Freq: Two times a day (BID) | ORAL | Status: DC
  Administered 2012-10-22 – 2012-10-23 (×3): 50 mg via ORAL
  Filled 2012-10-22 (×3): qty 1

## 2012-10-22 NOTE — Progress Notes (Signed)
PT Cancellation Note  Patient Details Name: Mike Briggs MRN: [id number] DOB: Jan 18, 1929   Cancelled Treatment:    Reason Eval/Treat Not Completed: Patient declined, no reason specified Pt appears to still be confused.  He refuses to work with me at this time , unable to state a reason.  He continues to obcess on family problems.  Demetrios Isaacs L 10/22/2012, 1:47 PM

## 2012-10-22 NOTE — Progress Notes (Signed)
[facility] PROGRESS NOTE  Mike Briggs [id number] DOB: [birth year] DOA: 10/20/2012 PCP: Mike Briggs., MD  Assessment/Plan: Malignant hypertension. Likely secondary from patient not taking his medications yesterday. His blood pressure remains uncontrolled.  Lopressor was increased to 50mg  bid this morning.  Will follow.  Generalized weakness. Patient not showing focal deficits. Physical therapy has recommended SNF which seems appropriate.  His speech is slow, he has masked facies and increased tone in extremities.  It appears that he may have underlying parkinsons disease.  Will ask neurology input.  Stage III chronic kidney disease, stable. Will follow.   Type 2 diabetes mellitus. Patient on sliding scale insulin. Blood sugars well controlled.   Probable cognitive impairment. Patient having increased confusion yesterday, could be related to elevated blood pressures. Chest x-ray and a urinalysis did not reveal evidence of infection. He has been afebrile since presentation. We'll continue to monitor.   Constipation. Patient on bowel regimen.   Code Status: full code Family Communication: no family present, will try and call family Disposition Plan: will need SNF   Consultants:  neurology  Procedures:  none   HPI/Subjective: Slow to answer, says he has pain in his shoulders  Objective: Filed Vitals:   10/22/12 0650  BP: 188/95  Pulse: 92  Temp: 98.2 F (36.8 C)  Resp: 20    Intake/Output Summary (Last 24 hours) at 10/22/12 1105 Last data filed at 10/22/12 1015  Gross per 24 hour  Intake    320 ml  Output    175 ml  Net    145 ml   Filed Weights   10/20/12 1133  Weight: 105.235 kg (232 lb)    Exam:   General:  NAD, masked facies  Cardiovascular: S1, S2 RRR  Respiratory: CTA B  Abdomen: soft, nt, nd, bs+  Musculoskeletal: no edema b/l.  Increased tone in upper and lower extremities   Data Reviewed: Basic Metabolic Panel:  Recent  Labs Lab 10/20/12 1338 10/21/12 0613 10/22/12 0618  NA 136 137 135  K 4.2 3.6 4.0  CL 99 100 98  CO2 29 26 25   GLUCOSE 195* 130* 134*  BUN 18 17 18   CREATININE 1.39* 1.58* 1.26  CALCIUM 9.2 9.2 9.7   Liver Function Tests: No results found for this basename: AST, ALT, ALKPHOS, BILITOT, PROT, ALBUMIN,  in the last 168 hours No results found for this basename: LIPASE, AMYLASE,  in the last 168 hours No results found for this basename: AMMONIA,  in the last 168 hours CBC:  Recent Labs Lab 10/20/12 1338 10/21/12 0613  WBC 11.5* 11.5*  NEUTROABS 8.1*  --   HGB 11.3* 11.1*  HCT 35.6* 34.6*  MCV 92.7 92.5  PLT 220 228   Cardiac Enzymes: No results found for this basename: CKTOTAL, CKMB, CKMBINDEX, TROPONINI,  in the last 168 hours BNP (last 3 results) No results found for this basename: PROBNP,  in the last 8760 hours CBG:  Recent Labs Lab 10/21/12 0751 10/21/12 1141 10/21/12 1719 10/21/12 2217 10/22/12 0730  GLUCAP 135* 187* 156* 127* 130*    No results found for this or any previous visit (from the past 240 hour(s)).   Studies: Dg Chest 1 View  10/20/2012   CLINICAL DATA:  AMS. Unable to walk.  EXAM: CHEST - 1 VIEW  COMPARISON:  07/29/2012  FINDINGS: Heart size is accentuated by the AP position of the patient. Small right pleural effusion is suspected. There are no focal consolidations. No pulmonary edema. Degenerative changes are  seen in the spine.  IMPRESSION: 1. Suspect small right pleural effusion. 2.  No focal pulmonary consolidation.   Electronically Signed   By: Shon Hale M.D.   On: 10/20/2012 14:42   Ct Head Wo Contrast  10/20/2012   CLINICAL DATA:  Generalized weakness. History of hypertension, diabetes.  EXAM: CT HEAD WITHOUT CONTRAST  TECHNIQUE: Contiguous axial images were obtained from the base of the skull through the vertex without intravenous contrast.  COMPARISON:  08/05/2012  FINDINGS: There is moderate central and cortical atrophy. Periventricular  white matter changes are consistent with small vessel disease. There is no evidence for hemorrhage, mass lesion, or acute infarction. Bone windows are unremarkable.  IMPRESSION: 1. Atrophy and small vessel disease. 2.  No evidence for acute intracranial abnormality.   Electronically Signed   By: Shon Hale M.D.   On: 10/20/2012 14:37    Scheduled Meds: . aspirin EC  81 mg Oral Daily  . atorvastatin  10 mg Oral QPC supper  . colchicine  0.6 mg Oral Daily  . docusate sodium  100 mg Oral BID  . enoxaparin (LOVENOX) injection  40 mg Subcutaneous Q24H  . glipiZIDE  2.5 mg Oral QAC breakfast  . insulin aspart  0-9 Units Subcutaneous TID WC  . lisinopril  40 mg Oral Daily  . metoprolol tartrate  50 mg Oral BID  . senna  1 tablet Oral BID  . sodium chloride  3 mL Intravenous Q12H  . sodium chloride  3 mL Intravenous Q12H   Continuous Infusions:   Principal Problem:   Malignant hypertension Active Problems:   DIABETES   Generalized weakness   Acute encephalopathy   Chronic kidney disease, stage III (moderate)   Chronic low back pain    Time spent: 41mins    [name]  [facility] Pager [phone]. If 7PM-7AM, please contact night-coverage at [email or web address], password [facility] 10/22/2012, 11:05 AM  LOS: 2 days

## 2012-10-22 NOTE — Progress Notes (Signed)
PT Cancellation Note  Patient Details Name: CRISTIAN CRIGER MRN: [id number] DOB: [birth year]   Cancelled Treatment:    Reason Eval/Treat Not Completed: Other (comment) An attempt was made to work with pt.  Unfortunately, he was so overwhelmingly obcessed with details from his previous marriage that he was unable to focus on any directions from me.  Will try again later on today.  Demetrios Isaacs L 10/22/2012, 10:10 AM

## 2012-10-22 NOTE — Progress Notes (Signed)
EEG Completed; Results Pending  

## 2012-10-22 NOTE — Clinical Social Work Note (Signed)
CSW presented bed offers to pt and pt's daughter. They choose [facility]. Facility notified. Awaiting stability for d/c.   Benay Pike, [place]

## 2012-10-22 NOTE — Clinical Social Work Placement (Signed)
Clinical Social Work Department CLINICAL SOCIAL WORK PLACEMENT NOTE 10/22/2012  Patient:  Mike Briggs, Mike Briggs  Account Number:  [id number] Admit date:  10/20/2012  Clinical Social Worker:  Benay Pike, LCSW  Date/time:  10/21/2012 02:40 PM  Clinical Social Work is seeking post-discharge placement for this patient at the following level of care:   SKILLED NURSING   (*CSW will update this form in Epic as items are completed)   10/21/2012  Patient/family provided with [place] Department of Clinical Social Work's list of facilities offering this level of care within the geographic area requested by the patient (or if unable, by the patient's family).  10/21/2012  Patient/family informed of their freedom to choose among providers that offer the needed level of care, that participate in Medicare, Medicaid or managed care program needed by the patient, have an available bed and are willing to accept the patient.  10/21/2012  Patient/family informed of [facility]' ownership interest in [facility], as well as of the fact that they are under no obligation to receive care at this facility.  PASARR submitted to EDS on  PASARR number received from [facility] on   FL2 transmitted to all facilities in geographic area requested by pt/family on  10/21/2012 FL2 transmitted to all facilities within larger geographic area on   Patient informed that his/her managed care company has contracts with or will negotiate with  certain facilities, including the following:     Patient/family informed of bed offers received:  10/22/2012 Patient chooses bed at [place] Physician recommends and patient chooses bed at  [place]  Patient to be transferred to  on   Patient to be transferred to facility by   The following physician request were entered in Epic:   Additional Comments: Pt has existing pasarr.  Benay Pike, [place]

## 2012-10-22 NOTE — Progress Notes (Signed)
UR Chart Review Completed  

## 2012-10-22 NOTE — Consult Note (Signed)
[place] A. Merlene Laughter, MD     [email or web address]          Mike Briggs is an 79 y.o. male.   ASSESSMENT/PLAN: 1. Acute encephalopathy of unclear etiology. The patient however seem to have accelerated hypertension/hypertensive urgency which can cause an encephalopathy. The patient will be evaluated further with a brain MRI and EEG. Dementia labs will also be obtained. Blood pressure control is advised.     2. Acute gait impairment on chronic gait impairment. Etiology could be coming from the hypertensive encephalopathy. There appears to have been some ataxic quality to his gait. The nurse and the family was suggested the patient may also be of had a posterior circulation infarct. However, this needs to be further evaluated and documented with a MRI. The patient seemed to have significant multi-joint arthritis which undoubtedly contributes to his baseline gait impairment. Uric acid will be obtained. Physical therapy and occupational therapy has also been obtained. The patient undoubtedly also has significant chronic white matter ischemic changes which can cause gait impairment.     The patient is an 79 year old man who has a baseline history of gait impairment. However, he presented to the hospital with global weakness. On further questioning to the daughter and nursing staff, it appears that the patient appears to have been quite ataxic even lacking the ability to sit up. He was quite weak globally on yesterday. He apparently could not move his upper and lower extremities. The daughter also reports that he has had some confusion spells. He seems to be focused in on past events with his wife which happened decades ago. The nursing staff reports that he had difficulties attending to conversation and seems to wander off at times with staring spells. The patient reports having pain involving the joints of the upper extremities but otherwise does not complain of other issues.  He seemed to focus past events and often is tangential. He does follow commands with prompting. The patient review of systems otherwise negative was limited given the confusion. The daughter reports that the patient stays in his wheelchair most of the time and does not ambulate. He gets up and goes to his bed but significantly very mobile.  GENERAL: This is a pleasant overweight man in no acute distress.  HEENT: Neck is supple and head is normocephalic atraumatic.  ABDOMEN: soft  EXTREMITIES: No edema of the ankles. The patient has reduced range of motion of the shoulder muscles due to significant pain. There is significant arthritic changes of the hands and upper extremities. There is mild swelling of the joints of the hands.   BACK: Unremarkable.  SKIN: Normal by inspection.    MENTAL STATUS: The patient is awake and alert. He is tangential in speech and seemed to have visual hallucinations about people in the exam room although none present. Speech is normal. He follows commands well.   CRANIAL NERVES: Pupils are equal, round and reactive to light and accommodation; extra ocular movements are full, there is no significant nystagmus; visual fields are full; upper and lower facial muscles are normal in strength and symmetric, there is no flattening of the nasolabial folds; tongue is midline; uvula is midline; shoulder elevation is normal.  MOTOR: Normal tone, bulk and strength; no pronator drift.  COORDINATION: Left finger to nose is normal, right finger to nose is normal, No rest tremor; no intention tremor; no postural tremor; no bradykinesia.  REFLEXES: Deep tendon reflexes are symmetrical and normal in the upper extremity but  absent in the legs even with augmentation. Plantar responses are flexor bilaterally.    Past Medical History  Diagnosis Date  . Diabetes mellitus   . Hypertension     Past Surgical History  Procedure Laterality Date  . Back surgery      cervical and lumbar     History reviewed. No pertinent family history.  Social History:  reports that he quit smoking about 45 years ago. He does not have any smokeless tobacco history on file. He reports that he does not drink alcohol or use illicit drugs.  Allergies: No Known Allergies  Medications: Prior to Admission medications   Medication Sig Start Date End Date Taking? Authorizing Provider  acetaminophen (TYLENOL) 500 MG tablet Take 500-1,000 mg by mouth every 6 (six) hours as needed for pain.   Yes Historical Provider, MD  atorvastatin (LIPITOR) 10 MG tablet Take 10 mg by mouth daily after supper.   Yes Historical Provider, MD  colchicine 0.6 MG tablet Take 0.6 mg by mouth daily.   Yes Historical Provider, MD  glipiZIDE (GLUCOTROL XL) 2.5 MG 24 hr tablet Take 2.5 mg by mouth daily.   Yes Historical Provider, MD  lisinopril (PRINIVIL,ZESTRIL) 40 MG tablet Take 40 mg by mouth daily.   Yes Historical Provider, MD  metoprolol tartrate (LOPRESSOR) 25 MG tablet Take 25 mg by mouth daily.   Yes Historical Provider, MD  traMADol (ULTRAM) 50 MG tablet Take 1 tablet (50 mg total) by mouth every 6 (six) hours as needed for pain. 10/12/12  Yes Virgel Manifold, MD    Scheduled Meds: . aspirin EC  81 mg Oral Daily  . atorvastatin  10 mg Oral QPC supper  . colchicine  0.6 mg Oral Daily  . docusate sodium  100 mg Oral BID  . enoxaparin (LOVENOX) injection  40 mg Subcutaneous Q24H  . glipiZIDE  2.5 mg Oral QAC breakfast  . insulin aspart  0-9 Units Subcutaneous TID WC  . lisinopril  40 mg Oral Daily  . metoprolol tartrate  50 mg Oral BID  . senna  1 tablet Oral BID  . sodium chloride  3 mL Intravenous Q12H  . sodium chloride  3 mL Intravenous Q12H   Continuous Infusions:  PRN Meds:.sodium chloride, acetaminophen, acetaminophen, alum & mag hydroxide-simeth, hydrALAZINE, ondansetron (ZOFRAN) IV, ondansetron, polyethylene glycol, sodium chloride, traMADol    Blood pressure 188/95, pulse 92, temperature 98.2 F  (36.8 C), temperature source Oral, resp. rate 20, height 5\' 11"  (1.803 m), weight 105.235 kg (232 lb), SpO2 97.00%.   Results for orders placed during the hospital encounter of 10/20/12 (from the past 48 hour(s))  CBC WITH DIFFERENTIAL     Status: Abnormal   Collection Time    10/20/12  1:38 PM      Result Value Range   WBC 11.5 (*) 4.0 - 10.5 K/uL   RBC 3.84 (*) 4.22 - 5.81 MIL/uL   Hemoglobin 11.3 (*) 13.0 - 17.0 g/dL   HCT 35.6 (*) 39.0 - 52.0 %   MCV 92.7  78.0 - 100.0 fL   MCH 29.4  26.0 - 34.0 pg   MCHC 31.7  30.0 - 36.0 g/dL   RDW 14.1  11.5 - 15.5 %   Platelets 220  150 - 400 K/uL   Neutrophils Relative % 71  43 - 77 %   Lymphocytes Relative 18  12 - 46 %   Monocytes Relative 10  3 - 12 %   Eosinophils Relative 1  0 -  5 %   Basophils Relative 0  0 - 1 %   Neutro Abs 8.1 (*) 1.7 - 7.7 K/uL   Lymphs Abs 2.1  0.7 - 4.0 K/uL   Monocytes Absolute 1.2 (*) 0.1 - 1.0 K/uL   Eosinophils Absolute 0.1  0.0 - 0.7 K/uL   Basophils Absolute 0.0  0.0 - 0.1 K/uL  BASIC METABOLIC PANEL     Status: Abnormal   Collection Time    10/20/12  1:38 PM      Result Value Range   Sodium 136  135 - 145 mEq/L   Potassium 4.2  3.5 - 5.1 mEq/L   Chloride 99  96 - 112 mEq/L   CO2 29  19 - 32 mEq/L   Glucose, Bld 195 (*) 70 - 99 mg/dL   BUN 18  6 - 23 mg/dL   Creatinine, Ser 1.39 (*) 0.50 - 1.35 mg/dL   Calcium 9.2  8.4 - 10.5 mg/dL   GFR calc non Af Amer 46 (*) >90 mL/min   GFR calc Af Amer 53 (*) >90 mL/min   Comment: (NOTE)     The eGFR has been calculated using the CKD EPI equation.     This calculation has not been validated in all clinical situations.     eGFR's persistently <90 mL/min signify possible Chronic Kidney     Disease.  GLUCOSE, CAPILLARY     Status: Abnormal   Collection Time    10/20/12  2:04 PM      Result Value Range   Glucose-Capillary 172 (*) 70 - 99 mg/dL  URINALYSIS, ROUTINE W REFLEX MICROSCOPIC     Status: Abnormal   Collection Time    10/20/12  5:01 PM       Result Value Range   Color, Urine YELLOW  YELLOW   APPearance CLEAR  CLEAR   Specific Gravity, Urine 1.020  1.005 - 1.030   pH 6.0  5.0 - 8.0   Glucose, UA 100 (*) NEGATIVE mg/dL   Hgb urine dipstick TRACE (*) NEGATIVE   Bilirubin Urine NEGATIVE  NEGATIVE   Ketones, ur NEGATIVE  NEGATIVE mg/dL   Protein, ur TRACE (*) NEGATIVE mg/dL   Urobilinogen, UA 0.2  0.0 - 1.0 mg/dL   Nitrite NEGATIVE  NEGATIVE   Leukocytes, UA NEGATIVE  NEGATIVE  URINE MICROSCOPIC-ADD ON     Status: None   Collection Time    10/20/12  5:01 PM      Result Value Range   Squamous Epithelial / LPF RARE  RARE   WBC, UA 0-2  <3 WBC/hpf   RBC / HPF 0-2  <3 RBC/hpf  BASIC METABOLIC PANEL     Status: Abnormal   Collection Time    10/21/12  6:13 AM      Result Value Range   Sodium 137  135 - 145 mEq/L   Potassium 3.6  3.5 - 5.1 mEq/L   Chloride 100  96 - 112 mEq/L   CO2 26  19 - 32 mEq/L   Glucose, Bld 130 (*) 70 - 99 mg/dL   BUN 17  6 - 23 mg/dL   Creatinine, Ser 1.58 (*) 0.50 - 1.35 mg/dL   Calcium 9.2  8.4 - 10.5 mg/dL   GFR calc non Af Amer 39 (*) >90 mL/min   GFR calc Af Amer 45 (*) >90 mL/min   Comment: (NOTE)     The eGFR has been calculated using the CKD EPI equation.     This calculation  has not been validated in all clinical situations.     eGFR's persistently <90 mL/min signify possible Chronic Kidney     Disease.  CBC     Status: Abnormal   Collection Time    10/21/12  6:13 AM      Result Value Range   WBC 11.5 (*) 4.0 - 10.5 K/uL   RBC 3.74 (*) 4.22 - 5.81 MIL/uL   Hemoglobin 11.1 (*) 13.0 - 17.0 g/dL   HCT 34.6 (*) 39.0 - 52.0 %   MCV 92.5  78.0 - 100.0 fL   MCH 29.7  26.0 - 34.0 pg   MCHC 32.1  30.0 - 36.0 g/dL   RDW 14.1  11.5 - 15.5 %   Platelets 228  150 - 400 K/uL  HEMOGLOBIN A1C     Status: Abnormal   Collection Time    10/21/12  6:13 AM      Result Value Range   Hemoglobin A1C 6.4 (*) <5.7 %   Comment: (NOTE)                                                                                According to the ADA Clinical Practice Recommendations for 2011, when     HbA1c is used as a screening test:      >=6.5%   Diagnostic of Diabetes Mellitus               (if abnormal result is confirmed)     5.7-6.4%   Increased risk of developing Diabetes Mellitus     References:Diagnosis and Classification of Diabetes Mellitus,Diabetes     [id number] 1):S62-S69 and Standards of Medical Care in             Diabetes - 2011,Diabetes Care,2011,34 (Suppl 1):S11-S61.   Mean Plasma Glucose 137 (*) <117 mg/dL   Comment: Performed at [place], CAPILLARY     Status: Abnormal   Collection Time    10/21/12  7:51 AM      Result Value Range   Glucose-Capillary 135 (*) 70 - 99 mg/dL   Comment 1 Notify RN     Comment 2 Documented in Chart    GLUCOSE, CAPILLARY     Status: Abnormal   Collection Time    10/21/12 11:41 AM      Result Value Range   Glucose-Capillary 187 (*) 70 - 99 mg/dL   Comment 1 Notify RN     Comment 2 Documented in Chart    GLUCOSE, CAPILLARY     Status: Abnormal   Collection Time    10/21/12  5:19 PM      Result Value Range   Glucose-Capillary 156 (*) 70 - 99 mg/dL   Comment 1 Notify RN     Comment 2 Documented in Chart    GLUCOSE, CAPILLARY     Status: Abnormal   Collection Time    10/21/12 10:17 PM      Result Value Range   Glucose-Capillary 127 (*) 70 - 99 mg/dL  BASIC METABOLIC PANEL     Status: Abnormal   Collection Time    10/22/12  6:18 AM      Result Value Range  Sodium 135  135 - 145 mEq/L   Potassium 4.0  3.5 - 5.1 mEq/L   Chloride 98  96 - 112 mEq/L   CO2 25  19 - 32 mEq/L   Glucose, Bld 134 (*) 70 - 99 mg/dL   BUN 18  6 - 23 mg/dL   Creatinine, Ser 1.26  0.50 - 1.35 mg/dL   Calcium 9.7  8.4 - 10.5 mg/dL   GFR calc non Af Amer 51 (*) >90 mL/min   GFR calc Af Amer 60 (*) >90 mL/min   Comment: (NOTE)     The eGFR has been calculated using the CKD EPI equation.     This calculation has not been validated in all  clinical situations.     eGFR's persistently <90 mL/min signify possible Chronic Kidney     Disease.  GLUCOSE, CAPILLARY     Status: Abnormal   Collection Time    10/22/12  7:30 AM      Result Value Range   Glucose-Capillary 130 (*) 70 - 99 mg/dL    Dg Chest 1 View  10/20/2012   CLINICAL DATA:  AMS. Unable to walk.  EXAM: CHEST - 1 VIEW  COMPARISON:  07/29/2012  FINDINGS: Heart size is accentuated by the AP position of the patient. Small right pleural effusion is suspected. There are no focal consolidations. No pulmonary edema. Degenerative changes are seen in the spine.  IMPRESSION: 1. Suspect small right pleural effusion. 2.  No focal pulmonary consolidation.   Electronically Signed   By: Shon Hale M.D.   On: 10/20/2012 14:42   Ct Head Wo Contrast  10/20/2012   CLINICAL DATA:  Generalized weakness. History of hypertension, diabetes.  EXAM: CT HEAD WITHOUT CONTRAST  TECHNIQUE: Contiguous axial images were obtained from the base of the skull through the vertex without intravenous contrast.  COMPARISON:  08/05/2012  FINDINGS: There is moderate central and cortical atrophy. Periventricular white matter changes are consistent with small vessel disease. There is no evidence for hemorrhage, mass lesion, or acute infarction. Bone windows are unremarkable.  IMPRESSION: 1. Atrophy and small vessel disease. 2.  No evidence for acute intracranial abnormality.   Electronically Signed   By: Shon Hale M.D.   On: 10/20/2012 14:37        Elio Haden A. Merlene Laughter, M.D.  Diplomate, Tax adviser of Psychiatry and Neurology ( Neurology). 10/22/2012, 9:41 AM

## 2012-10-23 DIAGNOSIS — F0150 Vascular dementia without behavioral disturbance: Secondary | ICD-10-CM

## 2012-10-23 DIAGNOSIS — G20 Parkinson's disease: Secondary | ICD-10-CM

## 2012-10-23 LAB — CBC
HCT: 38.2 % — ABNORMAL LOW (ref 39.0–52.0)
Hemoglobin: 12.3 g/dL — ABNORMAL LOW (ref 13.0–17.0)
RBC: 4.21 MIL/uL — ABNORMAL LOW (ref 4.22–5.81)
WBC: 13.8 10*3/uL — ABNORMAL HIGH (ref 4.0–10.5)

## 2012-10-23 LAB — GLUCOSE, CAPILLARY: Glucose-Capillary: 134 mg/dL — ABNORMAL HIGH (ref 70–99)

## 2012-10-23 MED ORDER — CARBIDOPA-LEVODOPA 25-100 MG PO TABS: 1.0000 | ORAL_TABLET | Freq: Two times a day (BID) | ORAL | Status: DC

## 2012-10-23 MED ORDER — ASPIRIN 81 MG PO TBEC: 81.0000 mg | DELAYED_RELEASE_TABLET | Freq: Every day | ORAL | Status: DC

## 2012-10-23 MED ORDER — HYDRALAZINE HCL 25 MG PO TABS
25.0000 mg | ORAL_TABLET | Freq: Three times a day (TID) | ORAL | Status: DC
  Administered 2012-10-23 (×2): 25 mg via ORAL
  Filled 2012-10-23 (×2): qty 1

## 2012-10-23 MED ORDER — CARBIDOPA-LEVODOPA 25-100 MG PO TABS
1.0000 | ORAL_TABLET | Freq: Three times a day (TID) | ORAL | Status: DC
  Administered 2012-10-23: 1 via ORAL
  Filled 2012-10-23: qty 1

## 2012-10-23 MED ORDER — METOPROLOL TARTRATE 50 MG PO TABS: 50.0000 mg | ORAL_TABLET | Freq: Two times a day (BID) | ORAL | Status: DC

## 2012-10-23 MED ORDER — HYDRALAZINE HCL 25 MG PO TABS: 50.0000 mg | ORAL_TABLET | Freq: Three times a day (TID) | ORAL | Status: DC

## 2012-10-23 MED ORDER — TRAMADOL HCL 50 MG PO TABS: 50.0000 mg | ORAL_TABLET | Freq: Four times a day (QID) | ORAL | Status: DC | PRN

## 2012-10-23 MED ORDER — LISINOPRIL 10 MG PO TABS: 40.0000 mg | ORAL_TABLET | Freq: Every day | ORAL | Status: DC

## 2012-10-23 MED ORDER — CYANOCOBALAMIN 1000 MCG/ML IJ SOLN
1000.0000 ug | Freq: Once | INTRAMUSCULAR | Status: AC
  Administered 2012-10-23: 1000 ug via INTRAMUSCULAR
  Filled 2012-10-23: qty 1

## 2012-10-23 NOTE — Clinical Social Work Placement (Signed)
Clinical Social Work Department CLINICAL SOCIAL WORK PLACEMENT NOTE 10/23/2012  Patient:  Mike Briggs, Mike Briggs  Account Number:  [id number] Admit date:  10/20/2012  Clinical Social Worker:  Benay Pike, LCSW  Date/time:  10/21/2012 02:40 PM  Clinical Social Work is seeking post-discharge placement for this patient at the following level of care:   SKILLED NURSING   (*CSW will update this form in Epic as items are completed)   10/21/2012  Patient/family provided with [place] Department of Clinical Social Work's list of facilities offering this level of care within the geographic area requested by the patient (or if unable, by the patient's family).  10/21/2012  Patient/family informed of their freedom to choose among providers that offer the needed level of care, that participate in Medicare, Medicaid or managed care program needed by the patient, have an available bed and are willing to accept the patient.  10/21/2012  Patient/family informed of [facility]' ownership interest in [facility], as well as of the fact that they are under no obligation to receive care at this facility.  PASARR submitted to EDS on  PASARR number received from [place] on   FL2 transmitted to all facilities in geographic area requested by pt/family on  10/21/2012 FL2 transmitted to all facilities within larger geographic area on   Patient informed that his/her managed care company has contracts with or will negotiate with  certain facilities, including the following:     Patient/family informed of bed offers received:  10/22/2012 Patient chooses bed at [place] Physician recommends and patient chooses bed at  [place]  Patient to be transferred to [place] on  10/23/2012 Patient to be transferred to facility by [facility]  The following physician request were entered in Epic:   Additional Comments: Pt has existing pasarr.  Benay Pike, [place]

## 2012-10-23 NOTE — Discharge Summary (Signed)
Physician Discharge Summary  Mike Briggs [id number] DOB: 25-Feb-1928 DOA: 10/20/2012  PCP: Glenda Chroman., MD  Admit date: 10/20/2012 Discharge date: 10/23/2012  Time spent: 67minutes  Recommendations for Outpatient Follow-up:  1. Patient will be discharged to SNF for physical therapy 2. Further adjustment to antihypertensive regimen to be done as outpatient 3. Follow up with neurology in 4 weeks to consider initiation of aricept 4. Increase sinimet to 1 tab tid after 1 week 5. Check CBC in 3-4 days to ensure resolution of leukocytosis.  Discharge Diagnoses:  Principal Problem:   Malignant hypertension Active Problems:   DIABETES   Generalized weakness   Acute encephalopathy   Chronic kidney disease, stage III (moderate)   Chronic low back pain   Parkinsonism   Vascular dementia   Discharge Condition: stable  Diet recommendation: low salt  Filed Weights   10/20/12 1133  Weight: 105.235 kg (232 lb)    History of present illness:  79 year old man presented to the emergency department with generalized weakness, onset this morning. Initial evaluation was notable for malignant hypertension but otherwise unremarkable. Patient developed mild confusion and EDP referred for observation.  History obtained from patient, no family members or aide present. However, patient is alert and oriented. His history is somewhat vague. He reports chronic low back pain, chronic bilateral shoulder pain and he ambulates with a walker and has been undergoing physical therapy as an outpatient. He has a history of both cervical and lumbar spine surgery. He reports today he had difficulty getting out of bed secondary to generalized weakness. He had was especially weak in his lower legs. He has chronic difficulty with his shoulders. He may have had some weakness in his right leg but it is not clear. He reports he took his blood pressure medications today. He reports chronic urinary incontinence. He  reports constipation. He notes no other focal neurologic deficits.  In the emergency department he was noted to be quite hypertensive. He developed some mild confusion by report from the emergency department physician and so observation was requested. No focal deficits were noted in the emergency department. Chemistry panel unremarkable. CBC notable for mild leukocytosis. Urinalysis, head CT and chest x-ray unremarkable. EKG nonacute.   Hospital Course:  This gentleman was admitted to the hospital with generalized weakness, malignant hypertension as well as some confusion. He was noted to be markedly hypertensive, and gait was unsteady, and he became confused. She was admitted to telemetry and started on antihypertensive medications. Infectious workup was found to be unremarkable. On further examination, it appears that patient had several parkinsonian features. Neurology was consulted and started the patient on Sinemet. He underwent MRI of his brain as well as EEG. EEG was indicative of a diffuse encephalopathy. This is felt to be related to his hypertension. MRI did not show any acute infarct but did show evidence of chronic microvascular changes. It was felt the patient did have vascular dementia which is likely contributing to his alteration in mental status. Recommendations were to consider Aricept to be started as an outpatient. Patient will need to followup with neurology in one month to consider this. He was seen by physical therapy who recommended skilled nursing facility placement. His blood pressure although still elevated is somewhat improved. Further titration will need to be done as an outpatient. He does have a mild leukocytosis without any fever or signs of infection. He a repeat CBC in 3-4 days to ensure resolution.  Procedures: EEG: 1. Moderate-to-severe generalized slowing indicating the  moderate-to-  severe generalized encephalopathy.  2. Triphasic waves, although infrequently.  Triphasic waves can be seen in metabolic encephalopathies from renal failure and hepatic failure.    Consultations:  Neurology  Discharge Exam: Filed Vitals:   10/23/12 0458  BP: 190/98  Pulse: 87  Temp: 98.3 F (36.8 C)  Resp: 14    General: awake, confused Cardiovascular: s1, s2, rrr Respiratory: cta b  Discharge Instructions  Discharge Orders   Future Orders Complete By Expires   Diet - low sodium heart healthy  As directed    Increase activity slowly  As directed        Medication List         acetaminophen 500 MG tablet  Commonly known as:  TYLENOL  Take 500-1,000 mg by mouth every 6 (six) hours as needed for pain.     aspirin 81 MG EC tablet  Take 1 tablet (81 mg total) by mouth daily.     atorvastatin 10 MG tablet  Commonly known as:  LIPITOR  Take 10 mg by mouth daily after supper.     carbidopa-levodopa 25-100 MG per tablet  Commonly known as:  SINEMET IR  Take 1 tablet by mouth 2 (two) times daily. Increase to 1 tab po tid  In 7 days     colchicine 0.6 MG tablet  Take 0.6 mg by mouth daily.     glipiZIDE 2.5 MG 24 hr tablet  Commonly known as:  GLUCOTROL XL  Take 2.5 mg by mouth daily.     hydrALAZINE 25 MG tablet  Commonly known as:  APRESOLINE  Take 2 tablets (50 mg total) by mouth every 8 (eight) hours.     lisinopril 40 MG tablet  Commonly known as:  PRINIVIL,ZESTRIL  Take 40 mg by mouth daily.     metoprolol 50 MG tablet  Commonly known as:  LOPRESSOR  Take 1 tablet (50 mg total) by mouth 2 (two) times daily.     traMADol 50 MG tablet  Commonly known as:  ULTRAM  Take 1 tablet (50 mg total) by mouth every 6 (six) hours as needed for pain.       No Known Allergies     Follow-up Information   Follow up with [facility], KOFI, MD. Schedule an appointment as soon as possible for a visit in 1 month.   Specialty:  Neurology   Contact information:   [place] [id number] [phone]       Follow up with  VYAS,DHRUV B., MD. Schedule an appointment as soon as possible for a visit in 2 weeks.   Specialty:  Internal Medicine   Contact information:   [place] [address] [phone]        The results of significant diagnostics from this hospitalization (including imaging, microbiology, ancillary and laboratory) are listed below for reference.    Significant Diagnostic Studies: Ct Abdomen Pelvis Wo Contrast  10/12/2012   *RADIOLOGY REPORT*  Clinical Data: Bilateral flank pain, difficulty urinating  CT ABDOMEN AND PELVIS WITHOUT CONTRAST  Technique:  Multidetector CT imaging of the abdomen and pelvis was performed following the standard protocol without intravenous contrast.  Comparison: Renal ultrasound 04/06/2004; remote CT abdomen/pelvis 06/25/2003  Findings:  Lower Chest:  Mild dependent atelectasis versus chronic fibrotic change in the periphery of the lower lungs. 5 mm nodular opacity in the periphery of the inferior right middle lobe.  Visualized cardiac structures within normal limits for size.  No pericardial effusion.  Unremarkable distal thoracic esophagus.  Abdomen: Unenhanced CT was performed per clinician order.  Lack of IV contrast limits sensitivity and specificity, especially for evaluation of abdominal/pelvic solid viscera.  Within these limitations, unremarkable CT appearance of the stomach, duodenum, spleen, adrenal glands and pancreas.  Normal hepatic contour and morphology.  Surgical changes of prior cholecystectomy.  No intra or extrahepatic biliary ductal dilatation.  Nonspecific perinephric renal stranding bilaterally.  No evidence of hydronephrosis or nephrolithiasis.  Low attenuation lesion in the interpolar right kidney is incompletely characterized in the absence of intravenous contrast material.  Normal-caliber large and small bowel throughout the abdomen.  No evidence of bowel obstruction.  If again colonic diverticular disease.  No focal bowel wall thickening.   Normal appendix in the right lower quadrant.  Fluid or suspicious adenopathy.  Pelvis: Bladder is mildly distended with urine.  The prostate gland is enlarged and rounded measuring 4.7 x 4.9 cm in transverse diameter.  Unremarkable seminal vesicles. Incompletely imaged right hydrocele.  Bones: No acute fracture or aggressive appearing lytic or blastic osseous lesion.  Surgical changes of prior L3-L5 posterior lumbar interbody fusion multilevel left laminectomies.  No definite hardware complication identified.  Vascular: Atherosclerotic vascular calcifications without aneurysmal dilatation.  IMPRESSION:  1.  Distended bladder in the setting of prostatomegaly.  Query bladder outlet obstruction secondary to BPH.  2.  Nonspecific perinephric renal stranding bilaterally.  Recommend correlation with urinalysis to exclude underlying upper urinary tract infection.  3.  Low attenuation renal cortical lesions bilaterally are incompletely characterized in the absence of intravenous contrast material.  Statistically, these are highly likely benign cysts.  4. Indeterminate 5 mm right middle lobe pulmonary nodule.  If the patient is at high risk for bronchogenic carcinoma, follow-up chest CT at 6-12 months is recommended.  If the patient is at low risk for bronchogenic carcinoma, follow-up chest CT at 12 months is recommended.  This recommendation follows the consensus statement: Guidelines for Management of Small Pulmonary Nodules Detected on CT Scans: A Statement from the Center as published in Radiology 2005; 237:395-400.  5.   Additional ancillary findings as above.   Original Report Authenticated By: Jacqulynn Cadet, M.D.   Dg Chest 1 View  10/20/2012   CLINICAL DATA:  AMS. Unable to walk.  EXAM: CHEST - 1 VIEW  COMPARISON:  07/29/2012  FINDINGS: Heart size is accentuated by the AP position of the patient. Small right pleural effusion is suspected. There are no focal consolidations. No pulmonary edema.  Degenerative changes are seen in the spine.  IMPRESSION: 1. Suspect small right pleural effusion. 2.  No focal pulmonary consolidation.   Electronically Signed   By: Shon Hale M.D.   On: 10/20/2012 14:42   Ct Head Wo Contrast  10/20/2012   CLINICAL DATA:  Generalized weakness. History of hypertension, diabetes.  EXAM: CT HEAD WITHOUT CONTRAST  TECHNIQUE: Contiguous axial images were obtained from the base of the skull through the vertex without intravenous contrast.  COMPARISON:  08/05/2012  FINDINGS: There is moderate central and cortical atrophy. Periventricular white matter changes are consistent with small vessel disease. There is no evidence for hemorrhage, mass lesion, or acute infarction. Bone windows are unremarkable.  IMPRESSION: 1. Atrophy and small vessel disease. 2.  No evidence for acute intracranial abnormality.   Electronically Signed   By: Shon Hale M.D.   On: 10/20/2012 14:37   Mr Brain Wo Contrast  10/22/2012   CLINICAL DATA:  Confusion. Altered mental status.  EXAM: MRI HEAD  WITHOUT CONTRAST  TECHNIQUE: Multiplanar, multisequence MR imaging was performed. No intravenous contrast was administered.  COMPARISON:  CT head without contrast 10/20/2012. MRI brain without contrast 07/30/2012.  FINDINGS: Midline structures are within normal limits. The diffusion-weighted images demonstrate no evidence for acute or subacute infarction. Atrophy and extensive white matter disease is similar to the prior exam. The ventricles are proportionate to the degree of atrophy. Flow is present in the major intracranial arteries. No significant extra-axial fluid collection is present. The patient is status post bilateral lens extractions. The paranasal sinuses and mastoid air cells are clear.  IMPRESSION: 1. No acute intracranial abnormality or significant interval change. 2. Stable atrophy and diffuse white matter disease. This likely reflects the sequela of chronic microvascular ischemia.   Electronically  Signed   By: Lawrence Santiago   On: 10/22/2012 18:00    Microbiology: No results found for this or any previous visit (from the past 240 hour(s)).   Labs: Basic Metabolic Panel:  Recent Labs Lab 10/20/12 1338 10/21/12 0613 10/22/12 0618  NA 136 137 135  K 4.2 3.6 4.0  CL 99 100 98  CO2 29 26 25   GLUCOSE 195* 130* 134*  BUN 18 17 18   CREATININE 1.39* 1.58* 1.26  CALCIUM 9.2 9.2 9.7   Liver Function Tests: No results found for this basename: AST, ALT, ALKPHOS, BILITOT, PROT, ALBUMIN,  in the last 168 hours No results found for this basename: LIPASE, AMYLASE,  in the last 168 hours No results found for this basename: AMMONIA,  in the last 168 hours CBC:  Recent Labs Lab 10/20/12 1338 10/21/12 0613 10/23/12 0537  WBC 11.5* 11.5* 13.8*  NEUTROABS 8.1*  --   --   HGB 11.3* 11.1* 12.3*  HCT 35.6* 34.6* 38.2*  MCV 92.7 92.5 90.7  PLT 220 228 239   Cardiac Enzymes: No results found for this basename: CKTOTAL, CKMB, CKMBINDEX, TROPONINI,  in the last 168 hours BNP: BNP (last 3 results) No results found for this basename: PROBNP,  in the last 8760 hours CBG:  Recent Labs Lab 10/22/12 0730 10/22/12 1138 10/22/12 1642 10/22/12 2040 10/23/12 0731  GLUCAP 130* 133* 116* 181* 134*       Signed:  Suede Greenawalt  [facility] 10/23/2012, 12:45 PM

## 2012-10-23 NOTE — Progress Notes (Signed)
Patient ID: Mike Briggs, male   DOB: 10-05-1928, 79 y.o.   MRN: [id number]  [place] A. Merlene Laughter, MD     [email or web address]          Mike Briggs is an 79 y.o. male.   Assessment/Plan: 1. Given the patient's MRI findings EEG and cognitive impairment, the patient most likely has dementia. This is undoubtedly vascular dementia. We may want to consider treatment within acetylcholine cholinesterase inhibitor such as Aricept.   2. Gait impairment likely multifactorial. Given the severe chronic ischemic white matter changes this undoubtedly is playing a significant role. Additionally, the patient seemed to have moderate parkinsonism which I think is also due to vascular white matter changes. We may want to consider Sinemet. We will start a low dose 1 tablet twice a day and increase to 3 times a day dosing after a week. Additional causes for the patient's gait impairment includes osteoarthritis and aging. The patient does not have second myalgias and therefore do not think that a statin myopathy is playing a role.  The patient reports no new complaints. He continues to have difficulties moving around. He complains of joint pain especially involving the upper extremities and also the lower extremities. There is no myalgias. He is awake and alert. He does have significant bradyphrenia and global moderate psychomotor slowing/bradykinesia. There is mask faces observed. The patient also has moderate cogwheeling throughout. No tremors are noted.   The patient's brain MRI is reviewed in person and shows a moderate global atrophy which is significant. More importantly however there is severe confluent deep white matter leukoencephalopathy. Nothing acute is seen on diffusion imaging.     Objective: Vital signs in last 24 hours: Temp:  [98.2 F (36.8 C)-99.6 F (37.6 C)] 98.3 F (36.8 C) (09/25 0458) Pulse Rate:  [81-105] 87 (09/25 0458) Resp:  [14-20] 14 (09/25 0458) BP:  (160-190)/(91-98) 190/98 mmHg (09/25 0458) SpO2:  [95 %-97 %] 96 % (09/25 0458)  Intake/Output from previous day: 09/24 0701 - 09/25 0700 In: 463 [P.O.:460; I.V.:3] Out: 206 [Urine:203; Stool:3] Intake/Output this shift:   Nutritional status: Carb Control   Lab Results: Results for orders placed during the hospital encounter of 10/20/12 (from the past 48 hour(s))  GLUCOSE, CAPILLARY     Status: Abnormal   Collection Time    10/21/12 11:41 AM      Result Value Range   Glucose-Capillary 187 (*) 70 - 99 mg/dL   Comment 1 Notify RN     Comment 2 Documented in Chart    GLUCOSE, CAPILLARY     Status: Abnormal   Collection Time    10/21/12  5:19 PM      Result Value Range   Glucose-Capillary 156 (*) 70 - 99 mg/dL   Comment 1 Notify RN     Comment 2 Documented in Chart    GLUCOSE, CAPILLARY     Status: Abnormal   Collection Time    10/21/12 10:17 PM      Result Value Range   Glucose-Capillary 127 (*) 70 - 99 mg/dL  BASIC METABOLIC PANEL     Status: Abnormal   Collection Time    10/22/12  6:18 AM      Result Value Range   Sodium 135  135 - 145 mEq/L   Potassium 4.0  3.5 - 5.1 mEq/L   Chloride 98  96 - 112 mEq/L   CO2 25  19 - 32 mEq/L   Glucose, Bld 134 (*) 70 -  99 mg/dL   BUN 18  6 - 23 mg/dL   Creatinine, Ser 1.26  0.50 - 1.35 mg/dL   Calcium 9.7  8.4 - 10.5 mg/dL   GFR calc non Af Amer 51 (*) >90 mL/min   GFR calc Af Amer 60 (*) >90 mL/min   Comment: (NOTE)     The eGFR has been calculated using the CKD EPI equation.     This calculation has not been validated in all clinical situations.     eGFR's persistently <90 mL/min signify possible Chronic Kidney     Disease.  GLUCOSE, CAPILLARY     Status: Abnormal   Collection Time    10/22/12  7:30 AM      Result Value Range   Glucose-Capillary 130 (*) 70 - 99 mg/dL  URIC ACID     Status: Abnormal   Collection Time    10/22/12 11:12 AM      Result Value Range   Uric Acid, Serum 9.7 (*) 4.0 - 7.8 mg/dL  HOMOCYSTEINE      Status: None   Collection Time    10/22/12 11:12 AM      Result Value Range   Homocysteine 12.4  4.0 - 15.4 umol/L   Comment: Performed at [facility]  RPR     Status: None   Collection Time    10/22/12 11:12 AM      Result Value Range   RPR NON REACTIVE  NON REACTIVE   Comment: Performed at [facility]     Status: None   Collection Time    10/22/12 11:12 AM      Result Value Range   Vitamin B-12 258  211 - 911 pg/mL   Comment: Performed at [facility]  TSH     Status: None   Collection Time    10/22/12 11:12 AM      Result Value Range   TSH 1.024  0.350 - 4.500 uIU/mL   Comment: Performed at [place], CAPILLARY     Status: Abnormal   Collection Time    10/22/12 11:38 AM      Result Value Range   Glucose-Capillary 133 (*) 70 - 99 mg/dL  GLUCOSE, CAPILLARY     Status: Abnormal   Collection Time    10/22/12  4:42 PM      Result Value Range   Glucose-Capillary 116 (*) 70 - 99 mg/dL   Comment 1 Notify RN     Comment 2 Documented in Chart    GLUCOSE, CAPILLARY     Status: Abnormal   Collection Time    10/22/12  8:40 PM      Result Value Range   Glucose-Capillary 181 (*) 70 - 99 mg/dL   Comment 1 Notify RN     Comment 2 Documented in Chart    CBC     Status: Abnormal   Collection Time    10/23/12  5:37 AM      Result Value Range   WBC 13.8 (*) 4.0 - 10.5 K/uL   RBC 4.21 (*) 4.22 - 5.81 MIL/uL   Hemoglobin 12.3 (*) 13.0 - 17.0 g/dL   HCT 38.2 (*) 39.0 - 52.0 %   MCV 90.7  78.0 - 100.0 fL   MCH 29.2  26.0 - 34.0 pg   MCHC 32.2  30.0 - 36.0 g/dL   RDW 13.7  11.5 - 15.5 %   Platelets 239  150 - 400  K/uL  GLUCOSE, CAPILLARY     Status: Abnormal   Collection Time    10/23/12  7:31 AM      Result Value Range   Glucose-Capillary 134 (*) 70 - 99 mg/dL    Lipid Panel No results found for this basename: CHOL, TRIG, HDL, CHOLHDL, VLDL, LDLCALC,  in the last 72 hours  Studies/Results: Mr Brain Wo  Contrast  10/22/2012   CLINICAL DATA:  Confusion. Altered mental status.  EXAM: MRI HEAD WITHOUT CONTRAST  TECHNIQUE: Multiplanar, multisequence MR imaging was performed. No intravenous contrast was administered.  COMPARISON:  CT head without contrast 10/20/2012. MRI brain without contrast 07/30/2012.  FINDINGS: Midline structures are within normal limits. The diffusion-weighted images demonstrate no evidence for acute or subacute infarction. Atrophy and extensive white matter disease is similar to the prior exam. The ventricles are proportionate to the degree of atrophy. Flow is present in the major intracranial arteries. No significant extra-axial fluid collection is present. The patient is status post bilateral lens extractions. The paranasal sinuses and mastoid air cells are clear.  IMPRESSION: 1. No acute intracranial abnormality or significant interval change. 2. Stable atrophy and diffuse white matter disease. This likely reflects the sequela of chronic microvascular ischemia.   Electronically Signed   By: Lawrence Santiago   On: 10/22/2012 18:00    Medications:  Scheduled Meds: . aspirin EC  81 mg Oral Daily  . atorvastatin  10 mg Oral QPC supper  . colchicine  0.6 mg Oral Daily  . docusate sodium  100 mg Oral BID  . enoxaparin (LOVENOX) injection  40 mg Subcutaneous Q24H  . glipiZIDE  2.5 mg Oral QAC breakfast  . hydrALAZINE  25 mg Oral Q8H  . insulin aspart  0-9 Units Subcutaneous TID WC  . lisinopril  40 mg Oral Daily  . metoprolol tartrate  50 mg Oral BID  . senna  1 tablet Oral BID  . sodium chloride  3 mL Intravenous Q12H   Continuous Infusions:  PRN Meds:.sodium chloride, acetaminophen, acetaminophen, alum & mag hydroxide-simeth, hydrALAZINE, ondansetron (ZOFRAN) IV, ondansetron, polyethylene glycol, sodium chloride, traMADol    LOS: 3 days   Jameson Morrow A. Merlene Laughter, M.D.  Diplomate, Tax adviser of Psychiatry and Neurology ( Neurology).

## 2012-10-23 NOTE — Progress Notes (Signed)
Called report to [place], Therapist, sports at [facility].  Verbalized understanding.  Pt dc'd to facility via EMS.  [place], Eulis Canner 10/23/2012

## 2012-10-23 NOTE — Procedures (Signed)
[place] A. Merlene Laughter, MD     [email or web address]        NAME:  GAL, LEFTON NO.:  [id number]  MEDICAL RECORD NO.:  [id number]  LOCATION:  EE                           FACILITY:  [facility]  PHYSICIAN:  Dason Mosley A. Merlene Laughter, M.D. DATE OF BIRTH:  [birth year]  DATE OF PROCEDURE: DATE OF DISCHARGE:  10/22/2012                             EEG INTERPRETATION   INDICATION:  An 79 year old man who presents with confusion, altered mental status.  The study is being done to evaluate for nonconvulsive seizures.  MEDICATIONS:  Acetaminophen, magnesium, aspirin, Lipitor, colchicine, Colace, Lovenox, insulin, lisinopril, metoprolol, Zofran, MiraLax, Senokot.  ANALYSES:  This is a 16-channel recording using standard 10/20 measurement which is carried out for 20 minutes. The background activity is generally slow with the highest activity achieved is 6 Hz.  The patient is noted to have infrequent episodic triphasic waves. Hyperventilation is carried out.  There is no focal or lateralized slowing.  IMPRESSION:  Abnormal recording due to the following: 1. Moderate-to-severe generalized slowing indicating the moderate-to-     severe generalized encephalopathy. 2. Triphasic waves, although infrequently observed.  Triphasic waves     can be seen in metabolic encephalopathies from renal     failure and hepatic failure.     Koltin Wehmeyer A. Merlene Laughter, M.D.     KAD/MEDQ  D:  10/23/2012  T:  10/23/2012  Job:  [id number]

## 2012-10-23 NOTE — Clinical Social Work Note (Addendum)
Pt d/c today to [facility]. Pt and facility aware and agreeable. Daughter notified by voicemail. Pt to transfer via [facility]. D/C summary faxed.  Benay Pike, [place]

## 2012-10-23 NOTE — Progress Notes (Signed)
Physical Therapy Treatment Patient Details Name: Mike Briggs MRN: [id number] DOB: 11/29/1928 Today's Date: 10/23/2012 Time: [id number] PT Time Calculation (min): 29 min  PT Assessment / Plan / Recommendation  History of Present Illness Reports he is willing to work with therapy.    PT Comments   Mike Briggs is seen after requiring assistance for eating his breakfast and is willing to participate in therapy.  Utilized max cueing and encouragement for mobility to decrease pain and pt reports decreased pain after exercises and transfer.  At this time is able to transfer with RW and mod A from slightly elevated surface and requires mod cueing for safety and hand placement.  Used teach back for safety awareness and nursing call button.  Spoke with nurse about transferring.   Follow Up Recommendations        Does the patient have the potential to tolerate intense rehabilitation     Barriers to Discharge        Equipment Recommendations       Recommendations for Other Services    Frequency     Progress towards PT Goals Progress towards PT goals: Progressing toward goals  Plan      Precautions / Restrictions Precautions Precautions: Fall   Pertinent Vitals/Pain "I hurt all over." FACES: 4/10    Mobility  Bed Mobility Bed Mobility: Supine to Sit Supine to Sit: HOB elevated;3: Mod assist Transfers Transfers: Sit to Stand;Stand Pivot Transfers;Stand to Sit Sit to Stand: 3: Mod assist;With upper extremity assist;From elevated surface Sit to Stand: Patient Percentage: 70% Stand to Sit: 3: Mod assist;With upper extremity assist;To chair/3-in-1 Stand Pivot Transfers: 3: Mod assist Transfer via Lift Equipment: Marketing executive Details for Transfer Assistance: pt is unable to stand...is a total lift Ambulation/Gait Ambulation/Gait Assistance: Not tested (comment) Assistive device: Rolling walker General Gait Details: used for stand pivot transfer to chair Stairs: No Wheelchair  Mobility Wheelchair Mobility: No    Exercises General Exercises - Lower Extremity Ankle Circles/Pumps: Seated;AROM;Both;20 reps;Strengthening Long Arc Quad: Seated;AROM;Strengthening;Both;20 reps Hip Flexion/Marching: Both;10 reps;Seated Toe Raises: Seated;Strengthening;Both;10 reps Heel Raises: Seated;Strengthening;Both;10 reps   PT Diagnosis:    PT Problem List:   PT Treatment Interventions:     PT Goals (current goals can now be found in the care plan section)    Visit Information  Last PT Received On: 10/23/12 History of Present Illness: Reports he is willing to work with therapy.     Subjective Data      Cognition  Cognition Arousal/Alertness: Awake/alert Behavior During Therapy: Flat affect Overall Cognitive Status: Impaired/Different from baseline Area of Impairment: Memory;Awareness Awareness: Intellectual;Anticipatory General Comments: Pt appears somewhat confused.  Able to state at end of session safety in the chair and which call button to call for nurse.    Balance     End of Session PT - End of Session Equipment Utilized During Treatment: Gait belt Activity Tolerance: Patient limited by fatigue Patient left: in chair;with call bell/phone within reach;with family/visitor present;with chair alarm set (NT (whitney) to retrive chair alarm box, had pad in place) Nurse Communication: Mobility status   GP     [name], MPT, ATC 10/23/2012, 10:55 AM

## 2012-11-20 ENCOUNTER — Other Ambulatory Visit (HOSPITAL_COMMUNITY): Payer: Self-pay | Admitting: Internal Medicine

## 2012-11-20 DIAGNOSIS — R110 Nausea: Secondary | ICD-10-CM

## 2012-11-20 DIAGNOSIS — R109 Unspecified abdominal pain: Secondary | ICD-10-CM

## 2012-11-27 ENCOUNTER — Ambulatory Visit (HOSPITAL_COMMUNITY): Admission: RE | Admit: 2012-11-27 | Payer: PRIVATE HEALTH INSURANCE | Source: Ambulatory Visit

## 2012-12-02 ENCOUNTER — Ambulatory Visit (HOSPITAL_COMMUNITY): Payer: PRIVATE HEALTH INSURANCE

## 2013-06-09 ENCOUNTER — Ambulatory Visit: Payer: Self-pay

## 2013-06-26 ENCOUNTER — Ambulatory Visit: Payer: Medicare Other

## 2013-06-26 VITALS — BP 145/92 | HR 92 | Resp 18 | Ht 71.0 in | Wt 250.0 lb

## 2013-06-26 DIAGNOSIS — E1140 Type 2 diabetes mellitus with diabetic neuropathy, unspecified: Secondary | ICD-10-CM

## 2013-06-26 DIAGNOSIS — E1149 Type 2 diabetes mellitus with other diabetic neurological complication: Secondary | ICD-10-CM

## 2013-06-26 DIAGNOSIS — B351 Tinea unguium: Secondary | ICD-10-CM

## 2013-06-26 DIAGNOSIS — M79609 Pain in unspecified limb: Secondary | ICD-10-CM

## 2013-06-26 DIAGNOSIS — R269 Unspecified abnormalities of gait and mobility: Secondary | ICD-10-CM

## 2013-06-26 DIAGNOSIS — M1990 Unspecified osteoarthritis, unspecified site: Secondary | ICD-10-CM

## 2013-06-26 NOTE — Patient Instructions (Signed)
Diabetes and Foot Care Diabetes may cause you to have problems because of poor blood supply (circulation) to your feet and legs. This may cause the skin on your feet to become thinner, break easier, and heal more slowly. Your skin may become dry, and the skin may peel and crack. You may also have nerve damage in your legs and feet causing decreased feeling in them. You may not notice minor injuries to your feet that could lead to infections or more serious problems. Taking care of your feet is one of the most important things you can do for yourself.  HOME CARE INSTRUCTIONS  Wear shoes at all times, even in the house. Do not go barefoot. Bare feet are easily injured.  Check your feet daily for blisters, cuts, and redness. If you cannot see the bottom of your feet, use a mirror or ask someone for help.  Wash your feet with warm water (do not use hot water) and mild soap. Then pat your feet and the areas between your toes until they are completely dry. Do not soak your feet as this can dry your skin.  Apply a moisturizing lotion or petroleum jelly (that does not contain alcohol and is unscented) to the skin on your feet and to dry, brittle toenails. Do not apply lotion between your toes.  Trim your toenails straight across. Do not dig under them or around the cuticle. File the edges of your nails with an emery board or nail file.  Do not cut corns or calluses or try to remove them with medicine.  Wear clean socks or stockings every day. Make sure they are not too tight. Do not wear knee-high stockings since they may decrease blood flow to your legs.  Wear shoes that fit properly and have enough cushioning. To break in new shoes, wear them for just a few hours a day. This prevents you from injuring your feet. Always look in your shoes before you put them on to be sure there are no objects inside.  Do not cross your legs. This may decrease the blood flow to your feet.  If you find a minor scrape,  cut, or break in the skin on your feet, keep it and the skin around it clean and dry. These areas may be cleansed with mild soap and water. Do not cleanse the area with peroxide, alcohol, or iodine.  When you remove an adhesive bandage, be sure not to damage the skin around it.  If you have a wound, look at it several times a day to make sure it is healing.  Do not use heating pads or hot water bottles. They may burn your skin. If you have lost feeling in your feet or legs, you may not know it is happening until it is too late.  Make sure your health care provider performs a complete foot exam at least annually or more often if you have foot problems. Report any cuts, sores, or bruises to your health care provider immediately. SEEK MEDICAL CARE IF:   You have an injury that is not healing.  You have cuts or breaks in the skin.  You have an ingrown nail.  You notice redness on your legs or feet.  You feel burning or tingling in your legs or feet.  You have pain or cramps in your legs and feet.  Your legs or feet are numb.  Your feet always feel cold. SEEK IMMEDIATE MEDICAL CARE IF:   There is increasing redness,   swelling, or pain in or around a wound.  There is a red line that goes up your leg.  Pus is coming from a wound.  You develop a fever or as directed by your health care provider.  You notice a bad smell coming from an ulcer or wound. Document Released: 01/13/2000 Document Revised: 09/17/2012 Document Reviewed: 06/24/2012 Exit[employer] Patient Information 2014 [employer].  

## 2013-06-26 NOTE — Progress Notes (Signed)
   Subjective:    Patient ID: Mike Briggs, male    DOB: Jan 23, 1929, 80 y.o.   MRN: [id number]  HPI Comments: N debridement, and diabetic foot exam L 1 - 10 toenails D and O long-term C elongated toenails, B/L 1st toenail beds are discolored purplish, and numbness A diabetes, difficulty trimming T none     Review of Systems  Constitutional: Positive for activity change.  Eyes: Positive for itching.  Gastrointestinal: Positive for constipation.  Musculoskeletal: Positive for arthralgias, back pain and gait problem.  Psychiatric/Behavioral: Positive for confusion.  All other systems reviewed and are negative.      Objective:   Physical Exam Is 80 year old [place] American male present with family members present at this time appears to be well-developed well-nourished for the most part oriented although somewhat slow in his responses.  Lower extremity objective findings as follows vascular status is diminished with pedal pulses DP thready at one over 4 bilateral PT nonpalpable bilateral + edema both lower extremities. Neurologically epicritic and proprioceptive sensations grossly diminished on Semmes Weinstein testing there is absent DTRs plantar response is noted be normal nails thick and brittle friable criptotic dry scaling fissure skin noted history keratoses is history of ecchymosis and subungual contusions both hallux nail beds no active infection is no discharge or drainage no purulence no secondary infections no x-rays taken at this time does have digital contractures which are rigid in nature also hallux patient has very little motor function of both lower extremities difficulty with gait and transfers with a wheelchair and can stand for transfer to chair only minimal walking or other activities are noted.       Assessment & Plan:  Assessment this time his diabetes with peripheral peripheral neuropathy and angiopathy and complications thick and painful mycotic nails 1  through 5 bilateral are debrided at this time nails painful thick and brittle discolored and tender to touch and palpation area and no secondary infections are noted there is history of contusion hallux bilateral does have diabetic shoes which are worn need replacing at this time we'll obtain authorization from his primary physician Dr. Woody Seller for diabetic shoes and custom molded dual density inlays in the future. Reappointed 3 months for further diabetic footcare and evaluations as needed again has profound loss of sensation Semmes Weinstein testing and on motor function testing as well. Patient has no active wounds or ulcerations aneurysm in doing good care with a lotion daily socks and shoes and protective gear all times recheck in 2-3 months for diabetic foot and palliative nail care mycotic nail care we'll obtain authorization for shoes as indicated  Harriet Masson DPM

## 2013-10-02 ENCOUNTER — Ambulatory Visit: Payer: Medicare Other

## 2014-03-21 ENCOUNTER — Encounter (HOSPITAL_COMMUNITY): Payer: Self-pay

## 2014-03-21 ENCOUNTER — Emergency Department (HOSPITAL_COMMUNITY)
Admission: EM | Admit: 2014-03-21 | Discharge: 2014-03-21 | Disposition: A | Discharge status: Home / Self Care | Payer: Medicare Other | Attending: Emergency Medicine | Admitting: Emergency Medicine

## 2014-03-21 ENCOUNTER — Emergency Department (HOSPITAL_COMMUNITY): Payer: Medicare Other

## 2014-03-21 DIAGNOSIS — N390 Urinary tract infection, site not specified: Secondary | ICD-10-CM | POA: Insufficient documentation

## 2014-03-21 DIAGNOSIS — I10 Essential (primary) hypertension: Secondary | ICD-10-CM | POA: Diagnosis not present

## 2014-03-21 DIAGNOSIS — Z7982 Long term (current) use of aspirin: Secondary | ICD-10-CM | POA: Diagnosis not present

## 2014-03-21 DIAGNOSIS — Z79899 Other long term (current) drug therapy: Secondary | ICD-10-CM | POA: Diagnosis not present

## 2014-03-21 DIAGNOSIS — E119 Type 2 diabetes mellitus without complications: Secondary | ICD-10-CM | POA: Diagnosis not present

## 2014-03-21 DIAGNOSIS — M1990 Unspecified osteoarthritis, unspecified site: Secondary | ICD-10-CM | POA: Diagnosis not present

## 2014-03-21 DIAGNOSIS — Z87891 Personal history of nicotine dependence: Secondary | ICD-10-CM | POA: Insufficient documentation

## 2014-03-21 DIAGNOSIS — Z8673 Personal history of transient ischemic attack (TIA), and cerebral infarction without residual deficits: Secondary | ICD-10-CM | POA: Insufficient documentation

## 2014-03-21 DIAGNOSIS — R531 Weakness: Secondary | ICD-10-CM | POA: Diagnosis present

## 2014-03-21 HISTORY — DX: Cerebral infarction, unspecified: I63.9

## 2014-03-21 LAB — CBC WITH DIFFERENTIAL/PLATELET
Basophils Absolute: 0 10*3/uL (ref 0.0–0.1)
Basophils Relative: 0 % (ref 0–1)
EOS ABS: 0.1 10*3/uL (ref 0.0–0.7)
Eosinophils Relative: 0 % (ref 0–5)
HEMATOCRIT: 39.2 % (ref 39.0–52.0)
Hemoglobin: 12.6 g/dL — ABNORMAL LOW (ref 13.0–17.0)
LYMPHS ABS: 1.4 10*3/uL (ref 0.7–4.0)
Lymphocytes Relative: 8 % — ABNORMAL LOW (ref 12–46)
MCH: 30.6 pg (ref 26.0–34.0)
MCHC: 32.1 g/dL (ref 30.0–36.0)
MCV: 95.1 fL (ref 78.0–100.0)
MONO ABS: 1.7 10*3/uL — AB (ref 0.1–1.0)
MONOS PCT: 10 % (ref 3–12)
NEUTROS PCT: 82 % — AB (ref 43–77)
Neutro Abs: 14.1 10*3/uL — ABNORMAL HIGH (ref 1.7–7.7)
Platelets: 209 10*3/uL (ref 150–400)
RBC: 4.12 MIL/uL — ABNORMAL LOW (ref 4.22–5.81)
RDW: 12.6 % (ref 11.5–15.5)
WBC: 17.3 10*3/uL — AB (ref 4.0–10.5)

## 2014-03-21 LAB — URINE MICROSCOPIC-ADD ON

## 2014-03-21 LAB — URINALYSIS, ROUTINE W REFLEX MICROSCOPIC
BILIRUBIN URINE: NEGATIVE
GLUCOSE, UA: NEGATIVE mg/dL
KETONES UR: NEGATIVE mg/dL
Nitrite: NEGATIVE
Protein, ur: 30 mg/dL — AB
Specific Gravity, Urine: 1.015 (ref 1.005–1.030)
UROBILINOGEN UA: 0.2 mg/dL (ref 0.0–1.0)
pH: 6 (ref 5.0–8.0)

## 2014-03-21 LAB — BASIC METABOLIC PANEL
Anion gap: 4 — ABNORMAL LOW (ref 5–15)
BUN: 22 mg/dL (ref 6–23)
CALCIUM: 8.3 mg/dL — AB (ref 8.4–10.5)
CO2: 25 mmol/L (ref 19–32)
Chloride: 107 mmol/L (ref 96–112)
Creatinine, Ser: 1.74 mg/dL — ABNORMAL HIGH (ref 0.50–1.35)
GFR calc Af Amer: 40 mL/min — ABNORMAL LOW (ref >90)
GFR calc non Af Amer: 35 mL/min — ABNORMAL LOW (ref >90)
Glucose, Bld: 143 mg/dL — ABNORMAL HIGH (ref 70–99)
Potassium: 4 mmol/L (ref 3.5–5.1)
Sodium: 136 mmol/L (ref 135–145)

## 2014-03-21 LAB — CBG MONITORING, ED: Glucose-Capillary: 147 mg/dL — ABNORMAL HIGH (ref 70–99)

## 2014-03-21 MED ORDER — CEFTRIAXONE SODIUM 1 G IJ SOLR
1.0000 g | Freq: Once | INTRAMUSCULAR | Status: AC
  Administered 2014-03-21: 1 g via INTRAVENOUS
  Filled 2014-03-21: qty 10

## 2014-03-21 MED ORDER — CEPHALEXIN 500 MG PO CAPS: 500.0000 mg | ORAL_CAPSULE | Freq: Four times a day (QID) | ORAL | Status: DC

## 2014-03-21 NOTE — ED Notes (Signed)
PT c/o generalized weakness after waking up from an evening nap. PT states he feels his normal now and denies any pain. Family present at bedside.

## 2014-03-21 NOTE — ED Notes (Signed)
EMS reports pt lives alone but has care takers that come by throughout the day.  Reports got up this morning and had breakfast.  Reports laid down this afternoon and felt weak all over after waking around 5 pm.  Reports has difficulty sitting up.  Reports has r sided weakness from stroke in the past.  Denies left sided weakness. Pt says, "i just can't sit up."  CBG 136, bp 195/101, hr 102, 02 sat 97%.

## 2014-03-21 NOTE — ED Provider Notes (Signed)
CSN: [id number]     Arrival date & time 03/21/14  1740 History  This chart was scribed for Maudry Diego, MD by Randa Evens, ED Scribe. This patient was seen in room APA01/APA01 and the patient's care was started at 5:57 PM.     Chief Complaint  Patient presents with  . Weakness   Patient is a 81 y.o. male presenting with weakness. The history is provided by the patient and a caregiver. No language interpreter was used.  Weakness This is a recurrent problem. The current episode started 1 to 2 hours ago. The problem occurs every several days. The problem has been resolved. Pertinent negatives include no chest pain, no abdominal pain and no headaches. Nothing aggravates the symptoms. Nothing relieves the symptoms. He has tried nothing for the symptoms.   HPI Comments: Mike Briggs is a 81 y.o. male with PMHx of diabetes, HTN, and CVA who presents to the Emergency Department complaining of weakness onset today that has resolved since being in the ED. Pt has a HX of weakness. Caregiver states that he was having trouble pulling him self up. Pt states that he has right sided deficits due to the previous stroke. Pt denies pain at this time. Care giver states that today he used his life alert due to not being able to reach the phone but states that this weakness is normal for him and is similar to previous weakness.      Past Medical History  Diagnosis Date  . Diabetes mellitus   . Hypertension   . Arthritis   . Stroke    Past Surgical History  Procedure Laterality Date  . Back surgery      cervical and lumbar   No family history on file. History  Substance Use Topics  . Smoking status: Former Smoker    Quit date: 08/07/1967  . Smokeless tobacco: Not on file  . Alcohol Use: No    Review of Systems  Constitutional: Negative for appetite change and fatigue.  HENT: Negative for congestion, ear discharge and sinus pressure.   Eyes: Negative for discharge.  Respiratory: Negative  for cough.   Cardiovascular: Negative for chest pain.  Gastrointestinal: Negative for abdominal pain and diarrhea.  Genitourinary: Negative for frequency and hematuria.  Musculoskeletal: Negative for back pain.  Skin: Negative for rash.  Neurological: Positive for weakness. Negative for seizures and headaches.  Psychiatric/Behavioral: Negative for hallucinations.      Allergies  Review of patient's allergies indicates no known allergies.  Home Medications   Prior to Admission medications   Medication Sig Start Date End Date Taking? Authorizing Provider  metoprolol succinate (TOPROL-XL) 25 MG 24 hr tablet Take 25 mg by mouth daily.   Yes Historical Provider, MD  aspirin EC 81 MG EC tablet Take 1 tablet (81 mg total) by mouth daily. 10/23/12   Kathie Dike, MD  atorvastatin (LIPITOR) 10 MG tablet Take 10 mg by mouth daily after supper.    Historical Provider, MD  colchicine 0.6 MG tablet Take 0.6 mg by mouth daily.    Historical Provider, MD  glipiZIDE (GLUCOTROL XL) 2.5 MG 24 hr tablet Take 2.5 mg by mouth daily.    Historical Provider, MD  hydrALAZINE (APRESOLINE) 25 MG tablet Take 2 tablets (50 mg total) by mouth every 8 (eight) hours. 10/23/12   Kathie Dike, MD  HYDROcodone-acetaminophen (NORCO/VICODIN) 5-325 MG per tablet Take 1 tablet by mouth every 6 (six) hours as needed for moderate pain.    Historical  Provider, MD  lisinopril (PRINIVIL,ZESTRIL) 40 MG tablet Take 40 mg by mouth daily.    Historical Provider, MD   BP 161/79 mmHg  Pulse 89  Temp(Src) 98.8 F (37.1 C) (Oral)  Resp 23  Ht 5\' 11"  (1.803 m)  Wt 265 lb (120.203 kg)  BMI 36.98 kg/m2  SpO2 98%   Physical Exam  Constitutional: He is oriented to person, place, and time. He appears well-developed.  HENT:  Head: Normocephalic.  Eyes: Conjunctivae and EOM are normal. No scleral icterus.  Neck: Neck supple. No thyromegaly present.  Cardiovascular: Normal rate and regular rhythm.  Exam reveals no gallop and no  friction rub.   No murmur heard. Pulmonary/Chest: No stridor. He has no wheezes. He has no rales. He exhibits no tenderness.  Abdominal: He exhibits no distension. There is no tenderness. There is no rebound.  Musculoskeletal: Normal range of motion. He exhibits no edema.  Lymphadenopathy:    He has no cervical adenopathy.  Neurological: He is oriented to person, place, and time. He exhibits normal muscle tone.  Severe genralzied weakness.   Skin: No rash noted. No erythema.  Psychiatric: He has a normal mood and affect. His behavior is normal.  Nursing note and vitals reviewed.   ED Course  Procedures (including critical care time) DIAGNOSTIC STUDIES: Oxygen Saturation is 95% on RA, adequate by my interpretation.    COORDINATION OF CARE: 6:05 PM-Discussed treatment plan with pt at bedside and pt agreed to plan.      Labs Review Labs Reviewed  CBC WITH DIFFERENTIAL/PLATELET - Abnormal; Notable for the following:    WBC 17.3 (*)    RBC 4.12 (*)    Hemoglobin 12.6 (*)    Neutrophils Relative % 82 (*)    Neutro Abs 14.1 (*)    Lymphocytes Relative 8 (*)    Monocytes Absolute 1.7 (*)    All other components within normal limits  BASIC METABOLIC PANEL - Abnormal; Notable for the following:    Glucose, Bld 143 (*)    Creatinine, Ser 1.74 (*)    Calcium 8.3 (*)    GFR calc non Af Amer 35 (*)    GFR calc Af Amer 40 (*)    Anion gap 4 (*)    All other components within normal limits  URINALYSIS, ROUTINE W REFLEX MICROSCOPIC - Abnormal; Notable for the following:    APPearance CLOUDY (*)    Hgb urine dipstick TRACE (*)    Protein, ur 30 (*)    Leukocytes, UA LARGE (*)    All other components within normal limits  URINE MICROSCOPIC-ADD ON - Abnormal; Notable for the following:    Bacteria, UA MANY (*)    All other components within normal limits  CBG MONITORING, ED - Abnormal; Notable for the following:    Glucose-Capillary 147 (*)    All other components within normal  limits    Imaging Review Ct Head Wo Contrast  03/21/2014   CLINICAL DATA:  81 year old male with a history of hypertension and cerebral vascular accident, with recent complaint of weakness.  EXAM: CT HEAD WITHOUT CONTRAST  TECHNIQUE: Contiguous axial images were obtained from the base of the skull through the vertex without intravenous contrast.  COMPARISON:  MRI 10/22/2012, CT 10/20/2012  FINDINGS: Unremarkable appearance of the calvarium without acute fracture or aggressive lesion.  Unremarkable appearance of the scalp soft tissues.  Unremarkable appearance of the bilateral orbits.  Mastoid air cells are clear.  Trace mucosal disease of the right  maxillary sinus.  New no evidence of acute intracranial hemorrhage, midline shift, mass effect.  Confluent hypodensity in periventricular white matter, similar distribution to the comparison CT.  Unremarkable configuration of the ventricular system.  Mild senescent volume loss, similar to prior.  Gray-white differentiation maintained.  Atherosclerotic calcifications of the anterior circulation and vertebrobasilar circulation.  IMPRESSION: No CT evidence of acute intracranial abnormality.  Changes of chronic microvascular ischemic disease, with associated intracranial atherosclerotic calcifications.  Signed,  Dulcy Fanny. Earleen Newport, DO  [facility]  [facility]   Electronically Signed   By: Corrie Mckusick D.O.   On: 03/21/2014 19:12     EKG Interpretation None      MDM   Final diagnoses:  None     Uti,  Pt with 24 hour nurses,  Who stated the pt is always very weak,  He is at his base line.  tx uti with keflex and follow up.  The chart was scribed for me under my direct supervision.  I personally performed the history, physical, and medical decision making and all procedures in the evaluation of this patient.Maudry Diego, MD 03/21/14 2030

## 2014-03-21 NOTE — Discharge Instructions (Signed)
Follow up with your md in 2-3 days for recheck °

## 2014-03-24 LAB — URINE CULTURE

## 2014-03-25 ENCOUNTER — Telehealth (HOSPITAL_COMMUNITY): Payer: Self-pay

## 2014-03-25 NOTE — ED Notes (Signed)
Post ED Visit - Positive Culture Follow-up  Culture report reviewed by antimicrobial stewardship pharmacist: []  Wes Barron, Pharm.D., BCPS [x]  Heide Guile, Pharm.D., BCPS []  Alycia Rossetti, Pharm.D., BCPS []  [place], [place].D., BCPS, AAHIVP []  Legrand Como, Pharm.D., BCPS, AAHIVP []  Isac Sarna, Pharm.D., BCPS  Positive urine culture Treated with cephalexin, organism sensitive to the same and no further patient follow-up is required at this time.  Ileene Musa 03/25/2014, 11:21 AM

## 2015-03-08 ENCOUNTER — Encounter: Payer: Self-pay | Admitting: Podiatry

## 2015-03-08 ENCOUNTER — Encounter: Payer: Medicaid Other | Admitting: Podiatry

## 2015-03-08 NOTE — Progress Notes (Signed)
This encounter was created in error - please disregard.

## 2015-05-01 ENCOUNTER — Emergency Department (HOSPITAL_COMMUNITY)
Admission: EM | Admit: 2015-05-01 | Discharge: 2015-05-01 | Disposition: A | Discharge status: Home / Self Care | Payer: Medicare Other | Attending: Emergency Medicine | Admitting: Emergency Medicine

## 2015-05-01 DIAGNOSIS — W19XXXA Unspecified fall, initial encounter: Secondary | ICD-10-CM

## 2015-05-01 DIAGNOSIS — Z7984 Long term (current) use of oral hypoglycemic drugs: Secondary | ICD-10-CM | POA: Diagnosis not present

## 2015-05-01 DIAGNOSIS — M1990 Unspecified osteoarthritis, unspecified site: Secondary | ICD-10-CM | POA: Diagnosis not present

## 2015-05-01 DIAGNOSIS — S3992XA Unspecified injury of lower back, initial encounter: Secondary | ICD-10-CM | POA: Insufficient documentation

## 2015-05-01 DIAGNOSIS — W06XXXA Fall from bed, initial encounter: Secondary | ICD-10-CM | POA: Diagnosis not present

## 2015-05-01 DIAGNOSIS — Y999 Unspecified external cause status: Secondary | ICD-10-CM | POA: Insufficient documentation

## 2015-05-01 DIAGNOSIS — Z8673 Personal history of transient ischemic attack (TIA), and cerebral infarction without residual deficits: Secondary | ICD-10-CM | POA: Insufficient documentation

## 2015-05-01 DIAGNOSIS — I10 Essential (primary) hypertension: Secondary | ICD-10-CM | POA: Insufficient documentation

## 2015-05-01 DIAGNOSIS — Y929 Unspecified place or not applicable: Secondary | ICD-10-CM | POA: Insufficient documentation

## 2015-05-01 DIAGNOSIS — Z87891 Personal history of nicotine dependence: Secondary | ICD-10-CM | POA: Insufficient documentation

## 2015-05-01 DIAGNOSIS — Y939 Activity, unspecified: Secondary | ICD-10-CM | POA: Diagnosis not present

## 2015-05-01 DIAGNOSIS — Z79899 Other long term (current) drug therapy: Secondary | ICD-10-CM | POA: Diagnosis not present

## 2015-05-01 DIAGNOSIS — E119 Type 2 diabetes mellitus without complications: Secondary | ICD-10-CM | POA: Diagnosis not present

## 2015-05-01 NOTE — ED Notes (Signed)
Pt tried to get out of bed and slide down to the floor and could not get back up. Pt denies any pain from sliding down to the floor but reports chronic pain in his lower back

## 2015-05-01 NOTE — ED Provider Notes (Signed)
CSN: [id number]     Arrival date & time 05/01/15  0117 History   First MD Initiated Contact with Patient 05/01/15 0146     Chief Complaint  Patient presents with  . Fall      HPI Patient has a history of diabetes, hypertension, stroke with right-sided weakness who presents to the emergency department after slipping out of his bed and sliding to the floor and being unable to get up out of bed.  He lives at home by himself and does have to hospital beds.  He has home health nursing that comes and looks in on him daily.  He states currently his motorized wheelchair is not working appropriately.  He does have residual right-sided weakness from a stroke but he denies new weakness.  He states he was just too weak to get off the floor.  He denies any acute pain.  He states he has a long-standing history of low back pain and is having his normal low back pain.  No recent nausea vomiting or diarrhea.  No recent fever or chills.  No other complaints.   Past Medical History  Diagnosis Date  . Diabetes mellitus   . Hypertension   . Arthritis   . Stroke [facility])    Past Surgical History  Procedure Laterality Date  . Back surgery      cervical and lumbar   No family history on file. Social History  Substance Use Topics  . Smoking status: Former Smoker    Quit date: 08/07/1967  . Smokeless tobacco: Not on file  . Alcohol Use: No    Review of Systems  All other systems reviewed and are negative.     Allergies  Review of patient's allergies indicates no known allergies.  Home Medications   Prior to Admission medications   Medication Sig Start Date End Date Taking? Authorizing Provider  aspirin EC 81 MG EC tablet Take 1 tablet (81 mg total) by mouth daily. 10/23/12   Kathie Dike, MD  atorvastatin (LIPITOR) 10 MG tablet Take 10 mg by mouth daily after supper.    Historical Provider, MD  cephALEXin (KEFLEX) 500 MG capsule Take 1 capsule (500 mg total) by mouth 4 (four) times daily. 03/21/14    Milton Ferguson, MD  colchicine 0.6 MG tablet Take 0.6 mg by mouth daily.    Historical Provider, MD  glipiZIDE (GLUCOTROL XL) 2.5 MG 24 hr tablet Take 2.5 mg by mouth daily.    Historical Provider, MD  hydrALAZINE (APRESOLINE) 25 MG tablet Take 2 tablets (50 mg total) by mouth every 8 (eight) hours. 10/23/12   Kathie Dike, MD  HYDROcodone-acetaminophen (NORCO/VICODIN) 5-325 MG per tablet Take 1 tablet by mouth every 6 (six) hours as needed for moderate pain.    Historical Provider, MD  lisinopril (PRINIVIL,ZESTRIL) 40 MG tablet Take 40 mg by mouth daily.    Historical Provider, MD  metoprolol succinate (TOPROL-XL) 25 MG 24 hr tablet Take 25 mg by mouth daily.    Historical Provider, MD   BP 159/78 mmHg  Pulse 92  Temp(Src) 99.2 F (37.3 C)  Resp 18  Ht 5\' 11"  (1.803 m)  Wt 240 lb (108.863 kg)  BMI 33.49 kg/m2  SpO2 92% Physical Exam  Constitutional: He is oriented to person, place, and time. He appears well-developed and well-nourished.  HENT:  Head: Normocephalic and atraumatic.  Eyes: EOM are normal.  Neck: Normal range of motion.  Cardiovascular: Normal rate, regular rhythm, normal heart sounds and intact distal pulses.  Pulmonary/Chest: Effort normal and breath sounds normal. No respiratory distress.  Abdominal: Soft. He exhibits no distension. There is no tenderness.  Musculoskeletal: Normal range of motion.  Normal strength in bilateral major muscle groups of upper extremities.  I'll weakness of his right lower extremity as compared to his left.  (See baseline from stroke)  Neurological: He is alert and oriented to person, place, and time.  Skin: Skin is warm and dry.  Psychiatric: He has a normal mood and affect. Judgment normal.  Nursing note and vitals reviewed.   ED Course  Procedures (including critical care time) Labs Review Labs Reviewed - No data to display  Imaging Review No results found. I have personally reviewed and evaluated these images and lab  results as part of my medical decision-making.   EKG Interpretation None      MDM   Final diagnoses:  None    Patient is overall well-appearing.  He states that he did not necessarily want to come to the emergency department but the paramedics recommended that he did.  He feels fine and has no focus complaints.  He denies weakness in his arms and legs.  He denies any acute pain from the injury.  I don't think he needs workup.  In the emergency department.  His vital signs are normal.  He can be discharged home safely at this time to follow-up with his primary care physician as needed.    Jola Schmidt, MD 05/01/15 [phone]

## 2015-12-06 DIAGNOSIS — Z961 Presence of intraocular lens: Secondary | ICD-10-CM | POA: Insufficient documentation

## 2016-05-22 ENCOUNTER — Encounter: Payer: Self-pay | Admitting: Family Medicine

## 2016-05-22 ENCOUNTER — Other Ambulatory Visit: Payer: Self-pay | Admitting: Family Medicine

## 2016-05-22 ENCOUNTER — Ambulatory Visit (INDEPENDENT_AMBULATORY_CARE_PROVIDER_SITE_OTHER): Payer: Medicare Other | Admitting: Family Medicine

## 2016-05-22 VITALS — BP 152/92 | HR 72 | Temp 97.0°F | Resp 18 | Ht 71.0 in | Wt 228.0 lb

## 2016-05-22 DIAGNOSIS — N183 Chronic kidney disease, stage 3 (moderate): Secondary | ICD-10-CM | POA: Diagnosis not present

## 2016-05-22 DIAGNOSIS — F0150 Vascular dementia without behavioral disturbance: Secondary | ICD-10-CM | POA: Diagnosis not present

## 2016-05-22 DIAGNOSIS — E785 Hyperlipidemia, unspecified: Secondary | ICD-10-CM

## 2016-05-22 DIAGNOSIS — I10 Essential (primary) hypertension: Secondary | ICD-10-CM | POA: Insufficient documentation

## 2016-05-22 DIAGNOSIS — N400 Enlarged prostate without lower urinary tract symptoms: Secondary | ICD-10-CM

## 2016-05-22 DIAGNOSIS — Z8739 Personal history of other diseases of the musculoskeletal system and connective tissue: Secondary | ICD-10-CM

## 2016-05-22 DIAGNOSIS — I69351 Hemiplegia and hemiparesis following cerebral infarction affecting right dominant side: Secondary | ICD-10-CM

## 2016-05-22 DIAGNOSIS — E1122 Type 2 diabetes mellitus with diabetic chronic kidney disease: Secondary | ICD-10-CM

## 2016-05-22 DIAGNOSIS — Z7689 Persons encountering health services in other specified circumstances: Secondary | ICD-10-CM | POA: Diagnosis not present

## 2016-05-22 DIAGNOSIS — K5909 Other constipation: Secondary | ICD-10-CM | POA: Insufficient documentation

## 2016-05-22 HISTORY — DX: Personal history of other diseases of the musculoskeletal system and connective tissue: Z87.39

## 2016-05-22 LAB — LIPID PANEL
Cholesterol: 162 mg/dL (ref <200)
HDL: 29 mg/dL — ABNORMAL LOW (ref >40)
LDL Cholesterol: 98 mg/dL (ref <100)
Total CHOL/HDL Ratio: 5.6 Ratio — ABNORMAL HIGH (ref <5.0)
Triglycerides: 174 mg/dL — ABNORMAL HIGH (ref <150)
VLDL: 35 mg/dL — ABNORMAL HIGH (ref <30)

## 2016-05-22 LAB — COMPREHENSIVE METABOLIC PANEL
ALK PHOS: 164 U/L — AB (ref 40–115)
ALT: 20 U/L (ref 9–46)
AST: 17 U/L (ref 10–35)
Albumin: 3.7 g/dL (ref 3.6–5.1)
BUN: 35 mg/dL — AB (ref 7–25)
CO2: 20 mmol/L (ref 20–31)
Calcium: 9.1 mg/dL (ref 8.6–10.3)
Chloride: 108 mmol/L (ref 98–110)
Creat: 2.1 mg/dL — ABNORMAL HIGH (ref 0.70–1.11)
Glucose, Bld: 181 mg/dL — ABNORMAL HIGH (ref 65–99)
Potassium: 4.9 mmol/L (ref 3.5–5.3)
Sodium: 144 mmol/L (ref 135–146)
TOTAL PROTEIN: 6.6 g/dL (ref 6.1–8.1)
Total Bilirubin: 0.4 mg/dL (ref 0.2–1.2)

## 2016-05-22 LAB — CBC
HCT: 41.2 % (ref 38.5–50.0)
Hemoglobin: 13 g/dL — ABNORMAL LOW (ref 13.2–17.1)
MCH: 30.7 pg (ref 27.0–33.0)
MCHC: 31.6 g/dL — ABNORMAL LOW (ref 32.0–36.0)
MCV: 97.2 fL (ref 80.0–100.0)
MPV: 12 fL (ref 7.5–12.5)
Platelets: 204 10*3/uL (ref 140–400)
RBC: 4.24 MIL/uL (ref 4.20–5.80)
RDW: 12.8 % (ref 11.0–15.0)
WBC: 8.4 10*3/uL (ref 3.8–10.8)

## 2016-05-22 MED ORDER — MIRTAZAPINE 7.5 MG PO TABS: 7.5000 mg | ORAL_TABLET | Freq: Every day | ORAL | 11 refills | Status: DC

## 2016-05-22 NOTE — Progress Notes (Signed)
Chief Complaint  Patient presents with  . Establish Care   Pleasant and elderly gentleman is here with a neighbor who brings him to office visits. He lives alone. He has 3 daughters, but they don't communicate often. He is interested in a primary care doctor closer to home. I do not have old records. He has multiple medical problems that he feels are well managed. Unfortunately, he has vascular dementia, and his memory is impaired. Without old records I believe my information is incomplete. He had a stroke and has hemiparesis. He is wheelchair-bound. He states he cannot walk. He has 2 aids but comes in during the day. He is able to transfer from his wheelchair in and out of his hospital bed and on and off the toilet independently. He does require help with bathing and dressing, they also shop, cook and clean for him. He has his medicines included in blister packs morning and afternoon, and the aides remind him to take his morning medications. He sometimes forgets to take his afternoon medications. There is no one available for him in the evening to remind him. He did not take his evening pills last night. He states he has well-controlled diabetes. He does not think he has any eye complication. He does have known kidney impairment. He also has some numbness in his feet. He does not see podiatry. His last eye exam was November 2017. He states his blood pressure has been difficult to control. His blood pressure is elevated today. This is likely due, in part, to his failure to take medications correctly This chart says he has a history of gout. He takes colchicine. He doesn't appear to remember this. His chart says he has history of Parkinson's disease. He doesn't remember this as well. He is not on Parkinson's medicines. He does have difficulty sleeping at night. He states at times he feels depressed because he is "lonely". He has never been treated for anxiety or depression.  Patient Active Problem  List   Diagnosis Date Noted  . Essential hypertension 05/22/2016  . Hemiparesis affecting right side as late effect of cerebrovascular accident (CVA) ([place]) 05/22/2016  . Personal history of gout 05/22/2016  . BPH (benign prostatic hyperplasia) 05/22/2016  . Chronic constipation 05/22/2016  . HLD (hyperlipidemia) 05/22/2016  . Pseudophakia 12/06/2015  . Vascular dementia 10/23/2012  . Chronic kidney disease, stage III (moderate) 10/20/2012  . DIABETES 12/16/2008  . ANKLE, ARTHRITIS, DEGEN./OSTEO 12/16/2008    Outpatient Encounter Prescriptions as of 05/22/2016  Medication Sig  . aspirin EC 81 MG EC tablet Take 1 tablet (81 mg total) by mouth daily.  Marland Kitchen atorvastatin (LIPITOR) 10 MG tablet Take 10 mg by mouth daily after supper.  . colchicine 0.6 MG tablet Take 0.6 mg by mouth daily.  Marland Kitchen glipiZIDE (GLUCOTROL XL) 2.5 MG 24 hr tablet Take 2.5 mg by mouth daily with breakfast.  . linaclotide (LINZESS) 290 MCG CAPS capsule Take 290 mcg by mouth daily before breakfast.  . lisinopril (PRINIVIL,ZESTRIL) 40 MG tablet Take 40 mg by mouth daily.  . memantine (NAMENDA) 10 MG tablet Take 10 mg by mouth 2 (two) times daily.  . metoprolol succinate (TOPROL-XL) 25 MG 24 hr tablet Take 25 mg by mouth daily.  . [DISCONTINUED] hydrALAZINE (APRESOLINE) 25 MG tablet Take 2 tablets (50 mg total) by mouth every 8 (eight) hours.  Marland Kitchen ACCU-CHEK AVIVA PLUS test strip   . hydrALAZINE (APRESOLINE) 50 MG tablet   . mirtazapine (REMERON) 7.5 MG tablet Take 1  tablet (7.5 mg total) by mouth at bedtime.   No facility-administered encounter medications on file as of 05/22/2016.     Past Medical History:  Diagnosis Date  . Arthritis   . Cataract   . Chronic kidney disease   . Depression   . Diabetes mellitus   . Hyperlipidemia   . Hypertension   . Stroke [facility])     Past Surgical History:  Procedure Laterality Date  . BACK SURGERY     cervical and lumbar  . CATARACT EXTRACTION    . CHOLECYSTECTOMY    . FOOT  SURGERY     5th toe   . SPINE SURGERY     lumbar and cervical fusions    Social History   Social History  . Marital status: Divorced    Spouse name: N/A  . Number of children: 3  . Years of education: 10   Occupational History  . Retired.    Social History Main Topics  . Smoking status: Former Smoker    Quit date: 08/07/1967  . Smokeless tobacco: Never Used  . Alcohol use No  . Drug use: No  . Sexual activity: Not Currently   Other Topics Concern  . Not on file   Social History Narrative   Wheelchair bound. Lives alone. Has 2 aides that come in to help him.    Family History  Problem Relation Age of Onset  . Family history unknown: Yes  Patient does not recall his family history due to his dementia  Review of Systems  Constitutional: Positive for malaise/fatigue.       Sleeps during the day  HENT: Negative for hearing loss.   Eyes: Negative for discharge and redness.       Impaired vision  Respiratory: Negative for sputum production and shortness of breath.   Cardiovascular: Positive for leg swelling. Negative for chest pain, palpitations and orthopnea.  Gastrointestinal: Positive for constipation. Negative for diarrhea.       Chronic constipation. Bowel movements 2-3 times a week.  Genitourinary:       Incontinence  Musculoskeletal: Positive for joint pain.       Arthritis knees and ankles  Skin: Negative for itching and rash.  Neurological: Positive for focal weakness.       Left hemiparesis  Psychiatric/Behavioral: Positive for depression and memory loss. The patient has insomnia.     BP (!) 152/92 (BP Location: Right Arm, Patient Position: Sitting, Cuff Size: Large)   Pulse 72   Temp 97 F (36.1 C) (Temporal)   Resp 18   Ht 5\' 11"  (1.803 m)   Wt 228 lb 0.6 oz (103.4 kg)   SpO2 98%   BMI 31.81 kg/m   Physical Exam  Constitutional: He appears well-developed and well-nourished.  Pleasant and congenial. In wheelchair.  HENT:  Head: Normocephalic  and atraumatic.  Mouth/Throat: Oropharynx is clear and moist.  Eyes: Conjunctivae are normal. Pupils are equal, round, and reactive to light.  Neck: Normal range of motion.  Cardiovascular: Normal rate.   Irregular  Pulmonary/Chest: Effort normal and breath sounds normal. No respiratory distress. He has no wheezes.  Abdominal: Soft. Bowel sounds are normal.  Musculoskeletal: He exhibits edema.  Partial hemiparesis, right  Lymphadenopathy:    He has no cervical adenopathy.  Neurological: He is alert. A sensory deficit is present.  Psychiatric: He has a normal mood and affect. His behavior is normal.   ASSESSMENT/PLAN:  1. Type 2 diabetes mellitus with stage 3 chronic kidney disease,  without long-term current use of insulin (HCC) ` - Comprehensive metabolic panel - CBC - Hemoglobin A1c - Lipid panel - VITAMIN D 25 Hydroxy (Vit-D Deficiency, Fractures) - Microalbumin / creatinine urine ratio - Urinalysis, Routine w reflex microscopic - Uric acid - Ambulatory referral to Podiatry  2. Essential hypertension Blood pressure elevated, noncompliant with medication due to dementia  3. Vascular dementia without behavioral disturbance Namenda  4. Hemiparesis affecting right side as late effect of cerebrovascular accident (CVA) ([place])  5. Personal history of gout  6. Benign prostatic hyperplasia without lower urinary tract symptoms  7. Chronic constipation Not well controlled  8. Encounter to establish care with new doctor  9. Hyperlipidemia, unspecified hyperlipidemia type   Patient Instructions  I am adding a new medicine to take at night This should help you to sleep  Take morning pills with breakfast Take evening pills with or without food  Come back for a physical  We will refer to podiatry Make appointent with Dr Gershon Crane for vision impairment  Need old records  Need lab tests today    Raylene Everts, MD

## 2016-05-22 NOTE — Patient Instructions (Addendum)
I am adding a new medicine to take at night This should help you to sleep  Take morning pills with breakfast Take evening pills with or without food  Come back for a physical  We will refer to podiatry Make appointent with Dr Gershon Crane for vision impairment  Need old records  Need lab tests today

## 2016-05-23 LAB — URINALYSIS, MICROSCOPIC ONLY
CRYSTALS: NONE SEEN [HPF]
Casts: NONE SEEN [LPF]
Squamous Epithelial / LPF: NONE SEEN [HPF] (ref <=5)
WBC, UA: >60 WBC/HPF — AB (ref <=5)
Yeast: NONE SEEN [HPF]

## 2016-05-23 LAB — MICROALBUMIN / CREATININE URINE RATIO
Creatinine, Urine: 125 mg/dL (ref 20–370)
MICROALB UR: 30.5 mg/dL
Microalb Creat Ratio: 244 mcg/mg creat — ABNORMAL HIGH (ref <30)

## 2016-05-23 LAB — URINALYSIS, ROUTINE W REFLEX MICROSCOPIC
BILIRUBIN URINE: NEGATIVE
Glucose, UA: NEGATIVE
KETONES UR: NEGATIVE
Nitrite: NEGATIVE
Specific Gravity, Urine: 1.015 (ref 1.001–1.035)
pH: 6 (ref 5.0–8.0)

## 2016-05-23 LAB — VITAMIN D 25 HYDROXY (VIT D DEFICIENCY, FRACTURES): VIT D 25 HYDROXY: 12 ng/mL — AB (ref 30–100)

## 2016-05-23 LAB — HEMOGLOBIN A1C
Hgb A1c MFr Bld: 6 % — ABNORMAL HIGH (ref <5.7)
MEAN PLASMA GLUCOSE: 126 mg/dL

## 2016-05-23 LAB — URIC ACID: Uric Acid, Serum: 9 mg/dL — ABNORMAL HIGH (ref 4.0–8.0)

## 2016-05-24 ENCOUNTER — Telehealth: Payer: Self-pay

## 2016-05-24 LAB — CREATININE WITH EST GFR
Creat: 1.97 mg/dL — ABNORMAL HIGH (ref 0.70–1.11)
GFR, Est African American: 35 mL/min — ABNORMAL LOW (ref >=60)
GFR, Est Non African American: 30 mL/min — ABNORMAL LOW (ref >=60)

## 2016-05-24 NOTE — Telephone Encounter (Signed)
Done

## 2016-05-24 NOTE — Telephone Encounter (Signed)
-----   Message from Raylene Everts, MD sent at 05/23/2016  8:25 AM EDT ----- Please call lab and inquire about GFR.  Sometimes they are present with the CMP - sometimes not.  I want a GFR with my CMP - is this a different order?  Can we get GFR with this lab?

## 2016-05-25 ENCOUNTER — Encounter: Payer: Self-pay | Admitting: Family Medicine

## 2016-05-31 ENCOUNTER — Ambulatory Visit (INDEPENDENT_AMBULATORY_CARE_PROVIDER_SITE_OTHER): Payer: Medicare Other | Admitting: Family Medicine

## 2016-05-31 ENCOUNTER — Encounter: Payer: Self-pay | Admitting: Family Medicine

## 2016-05-31 VITALS — BP 120/78 | HR 60 | Temp 96.3°F | Resp 16 | Ht 71.0 in | Wt 229.1 lb

## 2016-05-31 DIAGNOSIS — N184 Chronic kidney disease, stage 4 (severe): Secondary | ICD-10-CM | POA: Diagnosis not present

## 2016-05-31 DIAGNOSIS — I10 Essential (primary) hypertension: Secondary | ICD-10-CM | POA: Diagnosis not present

## 2016-05-31 DIAGNOSIS — E559 Vitamin D deficiency, unspecified: Secondary | ICD-10-CM | POA: Diagnosis not present

## 2016-05-31 DIAGNOSIS — K5909 Other constipation: Secondary | ICD-10-CM | POA: Diagnosis not present

## 2016-05-31 DIAGNOSIS — I69351 Hemiplegia and hemiparesis following cerebral infarction affecting right dominant side: Secondary | ICD-10-CM

## 2016-05-31 DIAGNOSIS — F0150 Vascular dementia without behavioral disturbance: Secondary | ICD-10-CM | POA: Diagnosis not present

## 2016-05-31 DIAGNOSIS — N183 Chronic kidney disease, stage 3 unspecified: Secondary | ICD-10-CM

## 2016-05-31 DIAGNOSIS — E1122 Type 2 diabetes mellitus with diabetic chronic kidney disease: Secondary | ICD-10-CM | POA: Diagnosis not present

## 2016-05-31 DIAGNOSIS — Z0000 Encounter for general adult medical examination without abnormal findings: Secondary | ICD-10-CM

## 2016-05-31 DIAGNOSIS — N400 Enlarged prostate without lower urinary tract symptoms: Secondary | ICD-10-CM | POA: Diagnosis not present

## 2016-05-31 HISTORY — DX: Vitamin D deficiency, unspecified: E55.9

## 2016-05-31 MED ORDER — GLIPIZIDE ER 2.5 MG PO TB24: 2.5000 mg | ORAL_TABLET | Freq: Every day | ORAL | 3 refills | Status: DC

## 2016-05-31 MED ORDER — COLCHICINE 0.6 MG PO TABS
0.6000 mg | ORAL_TABLET | Freq: Every day | ORAL | 3 refills | Status: DC
Start: 2016-05-31 — End: 2017-05-17

## 2016-05-31 MED ORDER — TAMSULOSIN HCL 0.4 MG PO CAPS: 0.4000 mg | ORAL_CAPSULE | Freq: Every day | ORAL | 3 refills | Status: DC

## 2016-05-31 MED ORDER — VITAMIN D (ERGOCALCIFEROL) 1.25 MG (50000 UNIT) PO CAPS: 50000.0000 [IU] | ORAL_CAPSULE | ORAL | 0 refills | Status: DC

## 2016-05-31 MED ORDER — HYDRALAZINE HCL 50 MG PO TABS: 50.0000 mg | ORAL_TABLET | Freq: Two times a day (BID) | ORAL | 3 refills | Status: DC

## 2016-05-31 MED ORDER — DOCUSATE SODIUM 100 MG PO CAPS: 100.0000 mg | ORAL_CAPSULE | Freq: Two times a day (BID) | ORAL | 3 refills | Status: DC

## 2016-05-31 MED ORDER — MIRTAZAPINE 7.5 MG PO TABS: 7.5000 mg | ORAL_TABLET | Freq: Every day | ORAL | 3 refills | Status: DC

## 2016-05-31 MED ORDER — ATORVASTATIN CALCIUM 10 MG PO TABS: 10.0000 mg | ORAL_TABLET | Freq: Every day | ORAL | 3 refills | Status: DC

## 2016-05-31 MED ORDER — FOAM CHAIR CUSHION MISC: 0 refills | Status: DC

## 2016-05-31 MED ORDER — METOPROLOL SUCCINATE ER 25 MG PO TB24: 25.0000 mg | ORAL_TABLET | Freq: Every day | ORAL | 3 refills | Status: DC

## 2016-05-31 MED ORDER — LISINOPRIL 40 MG PO TABS
40.0000 mg | ORAL_TABLET | Freq: Every day | ORAL | 3 refills | Status: DC
Start: 2016-05-31 — End: 2017-05-17

## 2016-05-31 NOTE — Progress Notes (Signed)
Chief Complaint  Patient presents with  . Annual Exam   HERE FOR EXAM NO ACUTE COMPLAINT  Recent labs reviewed with patient Diabetes control excellent.  A1c is 6 Lipids good, LDL 95 BP is great today He is vitamin D deficient He has renal insufficiency with a GFR of 35% Discussed this means he MUST control BP and DM, remain hydrated, do not take NSAID or OTC medicine without asking  Patient Active Problem List   Diagnosis Date Noted  . Vitamin D deficiency 05/31/2016  . Essential hypertension 05/22/2016  . Hemiparesis affecting right side as late effect of cerebrovascular accident (CVA) ([place]) 05/22/2016  . Personal history of gout 05/22/2016  . BPH (benign prostatic hyperplasia) 05/22/2016  . Chronic constipation 05/22/2016  . HLD (hyperlipidemia) 05/22/2016  . Pseudophakia 12/06/2015  . Vascular dementia 10/23/2012  . Chronic kidney disease, stage III (moderate) 10/20/2012  . Type 2 diabetes mellitus with diabetic chronic kidney disease ([place]) 12/16/2008  . ANKLE, ARTHRITIS, DEGEN./OSTEO 12/16/2008    Outpatient Encounter Prescriptions as of 05/31/2016  Medication Sig  . aspirin EC 81 MG EC tablet Take 1 tablet (81 mg total) by mouth daily.  Marland Kitchen atorvastatin (LIPITOR) 10 MG tablet Take 1 tablet (10 mg total) by mouth daily after supper.  . colchicine 0.6 MG tablet Take 1 tablet (0.6 mg total) by mouth daily.  Marland Kitchen glipiZIDE (GLUCOTROL XL) 2.5 MG 24 hr tablet Take 1 tablet (2.5 mg total) by mouth daily with breakfast.  . hydrALAZINE (APRESOLINE) 50 MG tablet Take 1 tablet (50 mg total) by mouth 2 (two) times daily at 8 am and 10 pm.  . lisinopril (PRINIVIL,ZESTRIL) 40 MG tablet Take 1 tablet (40 mg total) by mouth daily.  . metoprolol succinate (TOPROL-XL) 25 MG 24 hr tablet Take 1 tablet (25 mg total) by mouth daily.  . mirtazapine (REMERON) 7.5 MG tablet Take 1 tablet (7.5 mg total) by mouth at bedtime.  . docusate sodium (COLACE) 100 MG capsule Take 1 capsule (100 mg total)  by mouth 2 (two) times daily.  . Misc. Devices (FOAM CHAIR CUSHION) MISC Use in wheelchair/recliner  . tamsulosin (FLOMAX) 0.4 MG CAPS capsule Take 1 capsule (0.4 mg total) by mouth daily.  . Vitamin D, Ergocalciferol, (DRISDOL) 50000 units CAPS capsule Take 1 capsule (50,000 Units total) by mouth every 7 (seven) days.   No facility-administered encounter medications on file as of 05/31/2016.     No Known Allergies  Review of Systems  Constitutional: Positive for fatigue. Negative for activity change, appetite change and unexpected weight change.       Sleeps much of day  HENT: Positive for hearing loss. Negative for congestion and dental problem.   Eyes: Negative for photophobia and visual disturbance.  Respiratory: Negative for cough and shortness of breath.   Cardiovascular: Positive for leg swelling. Negative for chest pain and palpitations.  Gastrointestinal: Positive for constipation. Negative for blood in stool.       Chronic.  Sometimes goes every 5 d  Genitourinary: Positive for frequency. Negative for difficulty urinating.       Up 3-4 times a night  Musculoskeletal: Positive for gait problem. Negative for arthralgias and back pain.  Skin:       Says "bottom" hurts from sitting  Neurological: Negative for dizziness and headaches.  Psychiatric/Behavioral: Positive for sleep disturbance. Negative for dysphoric mood. The patient is not nervous/anxious.     BP 120/78 (BP Location: Left Arm, Patient Position: Sitting, Cuff Size: Normal)  Pulse 60   Temp (!) 96.3 F (35.7 C) (Temporal)   Resp 16   Ht 5\' 11"  (1.803 m)   Wt 229 lb 1.3 oz (103.9 kg)   SpO2 98%   BMI 31.95 kg/m   Physical Exam   BP 120/78 (BP Location: Left Arm, Patient Position: Sitting, Cuff Size: Normal)   Pulse 60   Temp (!) 96.3 F (35.7 C) (Temporal)   Resp 16   Ht 5\' 11"  (1.803 m)   Wt 229 lb 1.3 oz (103.9 kg)   SpO2 98%   BMI 31.95 kg/m   General Appearance:    Alert, cooperative, no  distress, appears stated age.  Examined in wheelchair  Head:    Normocephalic, without obvious abnormality, atraumatic  Eyes:    PERRL, conjunctiva/corneas clear,slight droop R lid- both eyes       Ears:    Normal TM's and external ear canals, both ears  Nose:   Nares normal, septum midline, mucosa normal, no drainage   or sinus tenderness  Throat:   Lips, mucosa, and tongue normal; NO teeth, gums normal  Neck:   Supple, symmetrical, trachea midline, no adenopathy;       thyroid:  No enlargement/tenderness/nodules; no carotid   bruit   Back:     Symmetric, no curvature, ROM normal, no CVA tenderness.  Well healed cervical and lumbar scars  Lungs:     Clear to auscultation bilaterally, respirations unlabored  Chest wall:    No tenderness or deformity  Heart:    Regular rate and rhythm, S1 and S2 normal, no murmur, rub   or gallop  Abdomen:     Soft, non-tender, bowel sounds active all four quadrants,    Cannot palpate well in seated position  Extremities:   Extremities atraumatic, no cyanosis, no clubbing,Tr edema  Pulses:   2+ and symmetric at wrist, absent feet  Skin:   Skin color, texture, turgor normal, no rashes or lesions.  Mult senile keratoses  Lymph nodes:   Cervical, supraclavicular, and axillary nodes normal  Neurologic:  weakness and muscle atrophy R side due to stroke.  Diminished sensation feet     ASSESSMENT/PLAN:  ANNUAL EXAM  1. Hemiparesis affecting right side as late effect of cerebrovascular accident (CVA) ([place])  2. Chronic constipation linzess not working. Change to colace  3. Type 2 diabetes mellitus with stage 4 chronic kidney disease, without long-term current use of insulin (HCC)  - CBC - COMPLETE METABOLIC PANEL WITH GFR - Hemoglobin A1c - Lipid panel - VITAMIN D 25 Hydroxy (Vit-D Deficiency, Fractures) - Urinalysis, Routine w reflex microscopic  4. Vitamin D deficiency  5. BPH with nocturia Add flomax  6. Renal insufficiency Discussed  7.  Dementia - mild Doubt benefit from namenda.  Will stop  Patient Instructions  Medication changes: STOP- linzess STOP- namenda START- colace twice a day for bowels              Reduce to one a day if loose bowels occur START- tamulosin once a day              This will shrink the prostate and make urination easier, it will reduce urine infections and night time awakenings START- vitamin d 50000 units once a week for 12 weeks  DO NOT TAKE OTC arthritis medicine like ibuprofen (motrin, advil) or naproxen ( aleve) May take tylenol - acetaminophen for pain  DRINK WATER.  Need plenty of fluids every day to prevent dehydration  See me in 3 months   Raylene Everts, MD

## 2016-05-31 NOTE — Patient Instructions (Addendum)
Medication changes: STOP- linzess STOP- namenda START- colace twice a day for bowels              Reduce to one a day if loose bowels occur START- tamulosin once a day              This will shrink the prostate and make urination easier, it will reduce urine infections and night time awakenings START- vitamin d 50000 units once a week for 12 weeks  DO NOT TAKE OTC arthritis medicine like ibuprofen (motrin, advil) or naproxen ( aleve) May take tylenol - acetaminophen for pain  DRINK WATER.  Need plenty of fluids every day to prevent dehydration  See me in 3 months

## 2016-06-11 ENCOUNTER — Telehealth: Payer: Self-pay

## 2016-06-11 NOTE — Telephone Encounter (Signed)
Care giver aware of mds advice, will call back if no better or worsens,

## 2016-06-11 NOTE — Telephone Encounter (Signed)
Aide called stating Mike Briggs are swollen, and sore. This has been going on " for a few days" they were asking if his meds would cause this? He is having no difficulty voiding.

## 2016-06-11 NOTE — Telephone Encounter (Signed)
Aide called to report groin swelling. Wants a call back. [phone]

## 2016-06-11 NOTE — Telephone Encounter (Signed)
Scrotal edema is often seen in men with general edema.  It is the same as swelling in his legs. It is uncomfortable, but not usually painful.  Treated with elevation.  Watch salt intake.  Usual dose fluid medicines.

## 2016-06-12 ENCOUNTER — Telehealth: Payer: Self-pay

## 2016-06-12 MED ORDER — CIPROFLOXACIN HCL 250 MG PO TABS: 250.0000 mg | ORAL_TABLET | Freq: Two times a day (BID) | ORAL | 0 refills | Status: DC

## 2016-06-12 NOTE — Telephone Encounter (Signed)
Care givers called stating pt has dark foul urine. Has hx of uti. Per dr Meda Coffee rx sent in for cipro 250 mg po bid x 7 days.

## 2016-06-19 ENCOUNTER — Telehealth: Payer: Self-pay

## 2016-06-19 ENCOUNTER — Ambulatory Visit (HOSPITAL_COMMUNITY)
Admission: RE | Admit: 2016-06-19 | Discharge: 2016-06-19 | Disposition: A | Discharge status: Home / Self Care | Payer: Medicare Other | Source: Ambulatory Visit | Attending: Family Medicine | Admitting: Family Medicine

## 2016-06-19 ENCOUNTER — Encounter: Payer: Self-pay | Admitting: Family Medicine

## 2016-06-19 ENCOUNTER — Ambulatory Visit (INDEPENDENT_AMBULATORY_CARE_PROVIDER_SITE_OTHER): Payer: Medicare Other | Admitting: Family Medicine

## 2016-06-19 VITALS — BP 138/100 | HR 72 | Temp 96.8°F | Resp 16 | Ht 71.0 in | Wt 230.0 lb

## 2016-06-19 DIAGNOSIS — N5089 Other specified disorders of the male genital organs: Secondary | ICD-10-CM | POA: Insufficient documentation

## 2016-06-19 DIAGNOSIS — N5082 Scrotal pain: Secondary | ICD-10-CM | POA: Insufficient documentation

## 2016-06-19 DIAGNOSIS — N433 Hydrocele, unspecified: Secondary | ICD-10-CM | POA: Insufficient documentation

## 2016-06-19 NOTE — Progress Notes (Signed)
Chief Complaint  Patient presents with  . swollen testicles   As above. Scrotum swollen for a few days.  I got a call last week that he had painless swelling of scrotum and advised elevation.  We got a call yesterday that they were becoming more painful and that he was feeling "poorly".   Today he feels well, but still has discomfort from scrotum.  No problem with bowels or bladder.  No fever today, but thought he might have yesterday.  No cough or respiratory symptoms.  No abdominal pain.  Never had similar problem.  No pedal edema or fluid overload.   Patient Active Problem List   Diagnosis Date Noted  . Vitamin D deficiency 05/31/2016  . Essential hypertension 05/22/2016  . Hemiparesis affecting right side as late effect of cerebrovascular accident (CVA) ([place]) 05/22/2016  . Personal history of gout 05/22/2016  . BPH (benign prostatic hyperplasia) 05/22/2016  . Chronic constipation 05/22/2016  . HLD (hyperlipidemia) 05/22/2016  . Pseudophakia 12/06/2015  . Vascular dementia 10/23/2012  . Chronic kidney disease, stage III (moderate) 10/20/2012  . Type 2 diabetes mellitus with diabetic chronic kidney disease ([place]) 12/16/2008  . ANKLE, ARTHRITIS, DEGEN./OSTEO 12/16/2008    Outpatient Encounter Prescriptions as of 06/19/2016  Medication Sig  . aspirin EC 81 MG EC tablet Take 1 tablet (81 mg total) by mouth daily.  Marland Kitchen atorvastatin (LIPITOR) 10 MG tablet Take 1 tablet (10 mg total) by mouth daily after supper.  . colchicine 0.6 MG tablet Take 1 tablet (0.6 mg total) by mouth daily.  Marland Kitchen docusate sodium (COLACE) 100 MG capsule Take 1 capsule (100 mg total) by mouth 2 (two) times daily.  Marland Kitchen glipiZIDE (GLUCOTROL XL) 2.5 MG 24 hr tablet Take 1 tablet (2.5 mg total) by mouth daily with breakfast.  . hydrALAZINE (APRESOLINE) 50 MG tablet Take 1 tablet (50 mg total) by mouth 2 (two) times daily at 8 am and 10 pm.  . lisinopril (PRINIVIL,ZESTRIL) 40 MG tablet Take 1 tablet (40 mg total) by mouth  daily.  . metoprolol succinate (TOPROL-XL) 25 MG 24 hr tablet Take 1 tablet (25 mg total) by mouth daily.  . mirtazapine (REMERON) 7.5 MG tablet Take 1 tablet (7.5 mg total) by mouth at bedtime.  . Misc. Devices (FOAM CHAIR CUSHION) MISC Use in wheelchair/recliner  . tamsulosin (FLOMAX) 0.4 MG CAPS capsule Take 1 capsule (0.4 mg total) by mouth daily.  . Vitamin D, Ergocalciferol, (DRISDOL) 50000 units CAPS capsule Take 1 capsule (50,000 Units total) by mouth every 7 (seven) days.  . [DISCONTINUED] ciprofloxacin (CIPRO) 250 MG tablet Take 1 tablet (250 mg total) by mouth 2 (two) times daily.   No facility-administered encounter medications on file as of 06/19/2016.     No Known Allergies  Review of Systems  Constitutional: Negative for activity change, appetite change, fever and unexpected weight change.  HENT: Negative for congestion and dental problem.   Eyes: Negative for photophobia and visual disturbance.  Respiratory: Negative for cough and shortness of breath.   Cardiovascular: Negative for chest pain, palpitations and leg swelling.  Gastrointestinal: Negative for abdominal pain, constipation and diarrhea.  Genitourinary: Negative for difficulty urinating and flank pain.  Musculoskeletal: Positive for back pain. Negative for arthralgias.       Back "aches " at times  Skin: Negative for rash and wound.  Psychiatric/Behavioral: Negative for dysphoric mood. The patient is not nervous/anxious.     BP (!) 138/100 (BP Location: Left Arm, Patient Position: Sitting, Cuff  Size: Large)   Pulse 72   Temp (!) 96.8 F (36 C) (Temporal)   Resp 16   Ht 5\' 11"  (1.803 m)   Wt 230 lb 0.6 oz (104.3 kg)   SpO2 97%   BMI 32.08 kg/m   Physical Exam  Constitutional: He appears well-developed and well-nourished.  Elderly.  Wheelchair bound.  Alert.  HENT:  Head: Normocephalic and atraumatic.  Mouth/Throat: Oropharynx is clear and moist.  Eyes: Conjunctivae are normal. Pupils are equal,  round, and reactive to light.  Cardiovascular: Normal rate, regular rhythm and normal heart sounds.   Pulmonary/Chest: Effort normal and breath sounds normal. He has no rales.  Abdominal: Soft. Bowel sounds are normal. He exhibits no distension. There is no tenderness.  No organomegaly  Genitourinary: Penis normal. Right testis shows swelling. Right testis shows no mass. Left testis shows swelling. Left testis shows no mass. Uncircumcised. No phimosis or penile erythema.  Genitourinary Comments: Diffuse swelling of scrotum, soft, mildly tender.  NO area of wound or cellulitis noted.  Cannnot palpate testes well , but no acutely tender mass.  Musculoskeletal: Normal range of motion. He exhibits no edema.  Diffuse atrophy and weakness of legs  Neurological: He is alert.  Skin: Skin is warm and dry. No erythema.  Psychiatric: He has a normal mood and affect. His behavior is normal.    ASSESSMENT/PLAN:  1. Swollen scrotum Possible edema from positioning.  Concern for hernia, mass, infection. - US Scrotum; Future - Ambulatory referral to Urology - [place] Art/Ven Flow Abd Pelv Doppler; Future   Patient Instructions  Need an ultrasound today  You might need to spend time in bed with scrotum elevated on pillow to get this swelling down  We will consult urology   Raylene Everts, MD

## 2016-06-19 NOTE — Patient Instructions (Signed)
Need an ultrasound today  You might need to spend time in bed with scrotum elevated on pillow to get this swelling down  We will consult urology

## 2016-06-19 NOTE — Telephone Encounter (Signed)
-----   Message from Raylene Everts, MD sent at 06/19/2016  4:33 PM EDT ----- Call Mr Cly (careiver).  He has bilateral hydroceles.  This is a fluid sac and not edema.  We are sending him to urology to check.  They need to make an effort to not let him sit on them, elevate with small pillow or rolled towel

## 2016-06-19 NOTE — Telephone Encounter (Signed)
Left message to call office

## 2016-06-20 ENCOUNTER — Telehealth: Payer: Self-pay | Admitting: Family Medicine

## 2016-06-20 NOTE — Telephone Encounter (Signed)
Relayed information from chart to the patients cma, urology referral sent to [facility] for soonest available appt

## 2016-07-26 ENCOUNTER — Telehealth: Payer: Self-pay | Admitting: *Deleted

## 2016-07-26 NOTE — Telephone Encounter (Signed)
Mike Briggs would like for a referral to be sent to [facility] in [place].     Per Olivia Mackie Dr Woody Seller prescribed him zaniflex 10mg  when he used to see him and it is a muscle relaxer, patient took one yesterday and today. Olivia Mackie wants to know if this is okay for him to be taking. Please advise

## 2016-07-26 NOTE — Telephone Encounter (Signed)
I called and left Mike Briggs a message. Referral has been faxed to [facility]

## 2016-07-26 NOTE — Telephone Encounter (Signed)
Yes, he may take zanafelx. There is a urology referral on the chart - but his appt is not until August.  We can forward his information to [facility] for sooner appointment.

## 2016-08-06 NOTE — Telephone Encounter (Signed)
-----   Message from Garald Balding, [place] sent at 08/06/2016  8:56 AM EDT ----- When I try to close this encounter it gives me this message and I can't close it, can you?   There are procedures that are unsigned, pended, signed & held, or awaiting second sign. Sign, release, or remove these procedures before discharging the patient or printing the AVS. This encounter contains procedures and/or medications that do not have an associated diagnosis. <BR> Please associate these orders before closing this encounter. There are procedures that are unsigned, pended, signed & held, or awaiting second sign. Sign, release, or remove these procedures before discharging the patient or printing the AVS.

## 2016-08-10 ENCOUNTER — Telehealth: Payer: Self-pay | Admitting: Family Medicine

## 2016-08-10 NOTE — Telephone Encounter (Signed)
noted 

## 2016-08-10 NOTE — Telephone Encounter (Signed)
Patient hit his foot on the wall (ran into w/wheelchair) and the toe/toenail area is bruised and was bleeding.  Lucas Mallow (his home aid) has cleaned the area and bandaged it. He has his foot elevated in his recliner. He is not bleeding, but because he is a diabetic she wanted to let you know this has occurred.    cb  [phone] or Ivin Booty (the other aid)  [phone].

## 2016-08-13 ENCOUNTER — Other Ambulatory Visit: Payer: Self-pay | Admitting: Family Medicine

## 2016-08-31 ENCOUNTER — Ambulatory Visit: Payer: Medicare Other | Admitting: Urology

## 2016-08-31 ENCOUNTER — Ambulatory Visit: Payer: Medicare Other | Admitting: Family Medicine

## 2016-09-03 LAB — CBC
HEMATOCRIT: 38.6 % (ref 38.5–50.0)
HEMOGLOBIN: 12.1 g/dL — AB (ref 13.2–17.1)
MCH: 29.8 pg (ref 27.0–33.0)
MCHC: 31.3 g/dL — AB (ref 32.0–36.0)
MCV: 95.1 fL (ref 80.0–100.0)
MPV: 11.7 fL (ref 7.5–12.5)
Platelets: 261 10*3/uL (ref 140–400)
RBC: 4.06 MIL/uL — ABNORMAL LOW (ref 4.20–5.80)
RDW: 13.8 % (ref 11.0–15.0)
WBC: 9.6 10*3/uL (ref 3.8–10.8)

## 2016-09-04 ENCOUNTER — Other Ambulatory Visit: Payer: Self-pay | Admitting: Family Medicine

## 2016-09-04 ENCOUNTER — Ambulatory Visit (INDEPENDENT_AMBULATORY_CARE_PROVIDER_SITE_OTHER): Payer: Medicare Other | Admitting: Family Medicine

## 2016-09-04 ENCOUNTER — Encounter: Payer: Self-pay | Admitting: Family Medicine

## 2016-09-04 VITALS — BP 134/84 | HR 68 | Temp 98.2°F | Resp 18 | Ht 71.0 in | Wt 213.1 lb

## 2016-09-04 DIAGNOSIS — I10 Essential (primary) hypertension: Secondary | ICD-10-CM

## 2016-09-04 DIAGNOSIS — M7918 Myalgia, other site: Secondary | ICD-10-CM

## 2016-09-04 DIAGNOSIS — M791 Myalgia: Secondary | ICD-10-CM | POA: Diagnosis not present

## 2016-09-04 DIAGNOSIS — E1122 Type 2 diabetes mellitus with diabetic chronic kidney disease: Secondary | ICD-10-CM

## 2016-09-04 DIAGNOSIS — N184 Chronic kidney disease, stage 4 (severe): Secondary | ICD-10-CM | POA: Diagnosis not present

## 2016-09-04 DIAGNOSIS — G8929 Other chronic pain: Secondary | ICD-10-CM | POA: Diagnosis not present

## 2016-09-04 LAB — LIPID PANEL
CHOLESTEROL: 142 mg/dL (ref <200)
HDL: 31 mg/dL — AB (ref >40)
LDL CALC: 89 mg/dL (ref <100)
TRIGLYCERIDES: 109 mg/dL (ref <150)
Total CHOL/HDL Ratio: 4.6 Ratio (ref <5.0)
VLDL: 22 mg/dL (ref <30)

## 2016-09-04 LAB — COMPLETE METABOLIC PANEL WITH GFR
ALBUMIN: 3.3 g/dL — AB (ref 3.6–5.1)
ALK PHOS: 144 U/L — AB (ref 40–115)
ALT: 18 U/L (ref 9–46)
AST: 18 U/L (ref 10–35)
BUN: 23 mg/dL (ref 7–25)
CALCIUM: 8.2 mg/dL — AB (ref 8.6–10.3)
CHLORIDE: 108 mmol/L (ref 98–110)
CO2: 23 mmol/L (ref 20–32)
Creat: 1.56 mg/dL — ABNORMAL HIGH (ref 0.70–1.11)
GFR, EST AFRICAN AMERICAN: 46 mL/min — AB (ref >=60)
GFR, EST NON AFRICAN AMERICAN: 40 mL/min — AB (ref >=60)
Glucose, Bld: 188 mg/dL — ABNORMAL HIGH (ref 65–99)
Potassium: 4.1 mmol/L (ref 3.5–5.3)
Sodium: 142 mmol/L (ref 135–146)
Total Bilirubin: 0.6 mg/dL (ref 0.2–1.2)
Total Protein: 6.4 g/dL (ref 6.1–8.1)

## 2016-09-04 LAB — URINALYSIS, ROUTINE W REFLEX MICROSCOPIC

## 2016-09-04 LAB — VITAMIN D 25 HYDROXY (VIT D DEFICIENCY, FRACTURES): Vit D, 25-Hydroxy: 28 ng/mL — ABNORMAL LOW (ref 30–100)

## 2016-09-04 LAB — HEMOGLOBIN A1C
Hgb A1c MFr Bld: 5.9 % — ABNORMAL HIGH (ref <5.7)
Mean Plasma Glucose: 123 mg/dL

## 2016-09-04 MED ORDER — METHYLPREDNISOLONE ACETATE 80 MG/ML IJ SUSP: 40.0000 mg | Freq: Once | INTRAMUSCULAR | Status: DC

## 2016-09-04 NOTE — Progress Notes (Signed)
Chief Complaint  Patient presents with  . Follow-up    3 month   Here for follow up Has lost 13 pounds His labs are discussed A1c (5.9) and lipids (LDL 89) are better Kidney function improved Vitamin D is trending up He feels 'like a million bucks" Only complaint is pain in the R sacroiliac region - chronic - hurts especially when tries to stand or transfer.  Patient Active Problem List   Diagnosis Date Noted  . Vitamin D deficiency 05/31/2016  . Essential hypertension 05/22/2016  . Hemiparesis affecting right side as late effect of cerebrovascular accident (CVA) ([place]) 05/22/2016  . Personal history of gout 05/22/2016  . BPH (benign prostatic hyperplasia) 05/22/2016  . Chronic constipation 05/22/2016  . HLD (hyperlipidemia) 05/22/2016  . Pseudophakia 12/06/2015  . Vascular dementia 10/23/2012  . Chronic kidney disease, stage III (moderate) 10/20/2012  . Type 2 diabetes mellitus with diabetic chronic kidney disease ([place]) 12/16/2008  . ANKLE, ARTHRITIS, DEGEN./OSTEO 12/16/2008    Outpatient Encounter Prescriptions as of 09/04/2016  Medication Sig  . aspirin EC 81 MG EC tablet Take 1 tablet (81 mg total) by mouth daily.  Marland Kitchen atorvastatin (LIPITOR) 10 MG tablet Take 1 tablet (10 mg total) by mouth daily after supper.  . colchicine 0.6 MG tablet Take 1 tablet (0.6 mg total) by mouth daily.  Marland Kitchen docusate sodium (COLACE) 100 MG capsule Take 1 capsule (100 mg total) by mouth 2 (two) times daily.  Marland Kitchen glipiZIDE (GLUCOTROL XL) 2.5 MG 24 hr tablet Take 1 tablet (2.5 mg total) by mouth daily with breakfast.  . hydrALAZINE (APRESOLINE) 50 MG tablet Take 1 tablet (50 mg total) by mouth 2 (two) times daily at 8 am and 10 pm.  . lisinopril (PRINIVIL,ZESTRIL) 40 MG tablet Take 1 tablet (40 mg total) by mouth daily.  . metoprolol succinate (TOPROL-XL) 25 MG 24 hr tablet Take 1 tablet (25 mg total) by mouth daily.  . mirtazapine (REMERON) 7.5 MG tablet Take 1 tablet (7.5 mg total) by mouth at  bedtime.  . Misc. Devices (FOAM CHAIR CUSHION) MISC Use in wheelchair/recliner  . tamsulosin (FLOMAX) 0.4 MG CAPS capsule Take 1 capsule (0.4 mg total) by mouth daily.  . Vitamin D, Ergocalciferol, (DRISDOL) 50000 units CAPS capsule TAKE 1 CAPSULE BY MOUTH EVERY 7 DAYS.   No facility-administered encounter medications on file as of 09/04/2016.     No Known Allergies  Review of Systems  Constitutional: Negative for activity change, appetite change, fever and unexpected weight change.  HENT: Negative for congestion and dental problem.   Eyes: Negative for photophobia and visual disturbance.  Respiratory: Negative for cough and shortness of breath.   Cardiovascular: Negative for chest pain, palpitations and leg swelling.  Gastrointestinal: Negative for abdominal pain, constipation and diarrhea.  Genitourinary: Negative for difficulty urinating and flank pain.  Musculoskeletal: Positive for back pain. Negative for arthralgias.       Back "aches " at times  Skin: Negative for rash and wound.  Psychiatric/Behavioral: Negative for dysphoric mood. The patient is not nervous/anxious.     BP 134/84 (BP Location: Left Arm, Patient Position: Sitting, Cuff Size: Large)   Pulse 68   Temp 98.2 F (36.8 C) (Temporal)   Resp 18   Ht 5\' 11"  (1.803 m)   Wt 213 lb 1.9 oz (96.7 kg)   SpO2 99%   BMI 29.72 kg/m   Physical Exam  Constitutional: He appears well-developed and well-nourished.  Pleasant and congenial. In wheelchair.  HENT:  Head: Normocephalic and atraumatic.  Mouth/Throat: Oropharynx is clear and moist.  Eyes: Pupils are equal, round, and reactive to light. Conjunctivae are normal.  Neck: Normal range of motion.  Cardiovascular: Normal rate.   Irregular  Pulmonary/Chest: Effort normal and breath sounds normal. No respiratory distress. He has no wheezes.  Abdominal: Soft. Bowel sounds are normal.  Musculoskeletal: He exhibits edema.  Partial hemiparesis, right.  Point tenderness  over R SI joint  Lymphadenopathy:    He has no cervical adenopathy.  Neurological: He is alert. A sensory deficit is present.  Psychiatric: He has a normal mood and affect. His behavior is normal.  Time out Consent Area of maximum tenderness identified by palpation and marked Skin cleansed with alcohol 1cc lidocaine 1% with DepoMedrol 40 mg placed in and around the tender area Patient tolerated well   ASSESSMENT/PLAN:  1. Type 2 diabetes mellitus with stage 4 chronic kidney disease, without long-term current use of insulin (HCC) controlled - CBC - COMPLETE METABOLIC PANEL WITH GFR - Hemoglobin A1c - Lipid panel - Microalbumin / creatinine urine ratio - Urinalysis, Routine w reflex microscopic  2. Essential hypertension controlled  3. Chronic myofascial pain SI pain on the right - Inject trigger point, 1 or 2   Patient Instructions  Put ice on your back when you get home for 20 min  Call for problems  No change in medicine  You are doing GREAT  See me in 3 months  Do not need labs for 6 months    Raylene Everts, MD

## 2016-09-04 NOTE — Patient Instructions (Addendum)
Put ice on your back when you get home for 20 min  Call for problems  No change in medicine  You are doing GREAT  See me in 3 months  Do not need labs for 6 months

## 2016-09-05 LAB — URINALYSIS, MICROSCOPIC ONLY
CRYSTALS: NONE SEEN [HPF]
Casts: NONE SEEN [LPF]
Yeast: NONE SEEN [HPF]

## 2016-09-05 LAB — URINALYSIS, ROUTINE W REFLEX MICROSCOPIC
BILIRUBIN URINE: NEGATIVE
Glucose, UA: NEGATIVE
Ketones, ur: NEGATIVE
NITRITE: NEGATIVE
SPECIFIC GRAVITY, URINE: 1.015 (ref 1.001–1.035)
pH: 6 (ref 5.0–8.0)

## 2016-09-17 ENCOUNTER — Other Ambulatory Visit: Payer: Self-pay | Admitting: Family Medicine

## 2016-10-23 ENCOUNTER — Other Ambulatory Visit: Payer: Self-pay | Admitting: Family Medicine

## 2016-11-07 ENCOUNTER — Ambulatory Visit (INDEPENDENT_AMBULATORY_CARE_PROVIDER_SITE_OTHER): Payer: Medicare Other | Admitting: Urology

## 2016-11-07 DIAGNOSIS — N432 Other hydrocele: Secondary | ICD-10-CM | POA: Diagnosis not present

## 2016-11-07 DIAGNOSIS — N401 Enlarged prostate with lower urinary tract symptoms: Secondary | ICD-10-CM

## 2016-11-19 ENCOUNTER — Other Ambulatory Visit: Payer: Self-pay | Admitting: Family Medicine

## 2016-12-06 ENCOUNTER — Ambulatory Visit: Payer: Medicare Other | Admitting: Family Medicine

## 2016-12-12 ENCOUNTER — Ambulatory Visit: Payer: Medicare Other | Admitting: Family Medicine

## 2016-12-13 ENCOUNTER — Ambulatory Visit: Payer: Medicare Other | Admitting: Family Medicine

## 2016-12-13 ENCOUNTER — Encounter: Payer: Self-pay | Admitting: Family Medicine

## 2016-12-13 LAB — CBC
HCT: 34.2 % — ABNORMAL LOW (ref 38.5–50.0)
HEMOGLOBIN: 11.1 g/dL — AB (ref 13.2–17.1)
MCH: 31.2 pg (ref 27.0–33.0)
MCHC: 32.5 g/dL (ref 32.0–36.0)
MCV: 96.1 fL (ref 80.0–100.0)
MPV: 11.5 fL (ref 7.5–12.5)
Platelets: 192 10*3/uL (ref 140–400)
RBC: 3.56 10*6/uL — ABNORMAL LOW (ref 4.20–5.80)
RDW: 11.8 % (ref 11.0–15.0)
WBC: 7 10*3/uL (ref 3.8–10.8)

## 2016-12-13 LAB — URINALYSIS, ROUTINE W REFLEX MICROSCOPIC
Bacteria, UA: NONE SEEN /HPF
Bilirubin Urine: NEGATIVE
GLUCOSE, UA: NEGATIVE
Hgb urine dipstick: NEGATIVE
Hyaline Cast: NONE SEEN /LPF
KETONES UR: NEGATIVE
NITRITE: NEGATIVE
Specific Gravity, Urine: 1.018 (ref 1.001–1.03)

## 2016-12-13 LAB — COMPLETE METABOLIC PANEL WITH GFR
AG Ratio: 1.3 (calc) (ref 1.0–2.5)
ALKALINE PHOSPHATASE (APISO): 131 U/L — AB (ref 40–115)
ALT: 17 U/L (ref 9–46)
AST: 17 U/L (ref 10–35)
Albumin: 3.6 g/dL (ref 3.6–5.1)
BUN/Creatinine Ratio: 16 (calc) (ref 6–22)
BUN: 27 mg/dL — AB (ref 7–25)
CALCIUM: 8.5 mg/dL — AB (ref 8.6–10.3)
CO2: 25 mmol/L (ref 20–32)
CREATININE: 1.64 mg/dL — AB (ref 0.70–1.11)
Chloride: 109 mmol/L (ref 98–110)
GFR, EST NON AFRICAN AMERICAN: 37 mL/min/{1.73_m2} — AB (ref >=60)
GFR, Est African American: 43 mL/min/{1.73_m2} — ABNORMAL LOW (ref >=60)
GLOBULIN: 2.8 g/dL (ref 1.9–3.7)
GLUCOSE: 148 mg/dL — AB (ref 65–139)
Potassium: 4.8 mmol/L (ref 3.5–5.3)
SODIUM: 142 mmol/L (ref 135–146)
Total Bilirubin: 0.3 mg/dL (ref 0.2–1.2)
Total Protein: 6.4 g/dL (ref 6.1–8.1)

## 2016-12-13 LAB — MICROALBUMIN / CREATININE URINE RATIO
Creatinine, Urine: 127 mg/dL (ref 20–320)
MICROALB/CREAT RATIO: 152 ug/mg{creat} — AB (ref <30)
Microalb, Ur: 19.3 mg/dL

## 2016-12-13 LAB — LIPID PANEL
CHOLESTEROL: 146 mg/dL (ref <200)
HDL: 36 mg/dL — AB (ref >40)
LDL Cholesterol (Calc): 88 mg/dL (calc)
Non-HDL Cholesterol (Calc): 110 mg/dL (calc) (ref <130)
TRIGLYCERIDES: 128 mg/dL (ref <150)
Total CHOL/HDL Ratio: 4.1 (calc) (ref <5.0)

## 2016-12-13 LAB — HEMOGLOBIN A1C
EAG (MMOL/L): 6.3 (calc)
HEMOGLOBIN A1C: 5.6 %{Hb} (ref <5.7)
MEAN PLASMA GLUCOSE: 114 (calc)

## 2016-12-18 ENCOUNTER — Other Ambulatory Visit: Payer: Self-pay | Admitting: Family Medicine

## 2016-12-19 ENCOUNTER — Ambulatory Visit: Payer: Medicare Other | Admitting: Urology

## 2016-12-25 ENCOUNTER — Ambulatory Visit (INDEPENDENT_AMBULATORY_CARE_PROVIDER_SITE_OTHER): Payer: Medicare Other | Admitting: Family Medicine

## 2016-12-25 ENCOUNTER — Encounter: Payer: Self-pay | Admitting: Family Medicine

## 2016-12-25 ENCOUNTER — Other Ambulatory Visit: Payer: Self-pay

## 2016-12-25 VITALS — BP 152/88 | HR 88 | Temp 97.2°F | Resp 18 | Ht 71.0 in

## 2016-12-25 DIAGNOSIS — E1122 Type 2 diabetes mellitus with diabetic chronic kidney disease: Secondary | ICD-10-CM | POA: Diagnosis not present

## 2016-12-25 DIAGNOSIS — N184 Chronic kidney disease, stage 4 (severe): Secondary | ICD-10-CM | POA: Diagnosis not present

## 2016-12-25 DIAGNOSIS — Z23 Encounter for immunization: Secondary | ICD-10-CM

## 2016-12-25 DIAGNOSIS — Z8739 Personal history of other diseases of the musculoskeletal system and connective tissue: Secondary | ICD-10-CM | POA: Diagnosis not present

## 2016-12-25 DIAGNOSIS — L89151 Pressure ulcer of sacral region, stage 1: Secondary | ICD-10-CM

## 2016-12-25 DIAGNOSIS — I10 Essential (primary) hypertension: Secondary | ICD-10-CM | POA: Diagnosis not present

## 2016-12-25 DIAGNOSIS — I69351 Hemiplegia and hemiparesis following cerebral infarction affecting right dominant side: Secondary | ICD-10-CM

## 2016-12-25 MED ORDER — FOAM CHAIR CUSHION MISC: 0 refills | Status: DC

## 2016-12-25 NOTE — Patient Instructions (Signed)
You are doing very well No change in medications Blood pressure and diabetes are good I have reordered a chair pad for you Immunizations today See me in 3 months Do not need laboratory tests 6 months

## 2016-12-25 NOTE — Progress Notes (Signed)
Chief Complaint  Patient presents with  . Follow-up    3 months  Patient is here for a routine follow-up.  He feels well.  He states he still has problems with a "sore" on his backside.  He believes is from sitting so much in his wheelchair and in a recliner.  I ordered a pad for him in May.  He does not think he ever got one.  His aide who is with him today does not recall a new pad either.  The area on his backside is reddened but not open. He had recent lab work.  His hemoglobin A1c is 5.6.  His lipids are well controlled.  No other abnormalities identified.  He is congratulated on taking care of himself. He is overdue for foot exam and this is done today His eye exam was 1 year ago and he will schedule a new one. He needs his fingernails trimmed, his aide will not do this for him.  My nurse and I will, and he appreciates the assistance. Dementia is stable.  No problems with mood, depression, anxiety, or behavior. No change in medications. No falls or injury.  No recent illness. Immunizations are up-to-date.   Patient Active Problem List   Diagnosis Date Noted  . Vitamin D deficiency 05/31/2016  . Essential hypertension 05/22/2016  . Hemiparesis affecting right side as late effect of cerebrovascular accident (CVA) ([place]) 05/22/2016  . Personal history of gout 05/22/2016  . BPH (benign prostatic hyperplasia) 05/22/2016  . Chronic constipation 05/22/2016  . HLD (hyperlipidemia) 05/22/2016  . Pseudophakia 12/06/2015  . Vascular dementia 10/23/2012  . Chronic kidney disease, stage III (moderate) ([place]) 10/20/2012  . Type 2 diabetes mellitus with diabetic chronic kidney disease ([place]) 12/16/2008  . ANKLE, ARTHRITIS, DEGEN./OSTEO 12/16/2008    Outpatient Encounter Medications as of 12/25/2016  Medication Sig  . aspirin EC 81 MG EC tablet Take 1 tablet (81 mg total) by mouth daily.  Marland Kitchen atorvastatin (LIPITOR) 10 MG tablet Take 1 tablet (10 mg total) by mouth daily after supper.  .  colchicine 0.6 MG tablet Take 1 tablet (0.6 mg total) by mouth daily.  Marland Kitchen docusate sodium (COLACE) 100 MG capsule Take 1 capsule (100 mg total) by mouth 2 (two) times daily.  Marland Kitchen glipiZIDE (GLUCOTROL XL) 2.5 MG 24 hr tablet Take 1 tablet (2.5 mg total) by mouth daily with breakfast.  . hydrALAZINE (APRESOLINE) 50 MG tablet Take 1 tablet (50 mg total) by mouth 2 (two) times daily at 8 am and 10 pm.  . lisinopril (PRINIVIL,ZESTRIL) 40 MG tablet Take 1 tablet (40 mg total) by mouth daily.  . metoprolol succinate (TOPROL-XL) 25 MG 24 hr tablet Take 1 tablet (25 mg total) by mouth daily.  . mirtazapine (REMERON) 7.5 MG tablet Take 1 tablet (7.5 mg total) by mouth at bedtime.  . Misc. Devices (FOAM CHAIR CUSHION) MISC Use in wheelchair/recliner  . tamsulosin (FLOMAX) 0.4 MG CAPS capsule Take 1 capsule (0.4 mg total) by mouth daily.  . Vitamin D, Ergocalciferol, (DRISDOL) 50000 units CAPS capsule TAKE 1 CAPSULE BY MOUTH EVERY 7 DAYS.  Marland Kitchen Vitamin D, Ergocalciferol, (DRISDOL) 50000 units CAPS capsule TAKE 1 CAPSULE BY MOUTH EVERY 7 DAYS.  . [DISCONTINUED] Misc. Devices ([place]) MISC Use in wheelchair/recliner   Facility-Administered Encounter Medications as of 12/25/2016  Medication  . methylPREDNISolone acetate (DEPO-MEDROL) injection 40 mg    No Known Allergies  Review of Systems  Constitutional: Negative for activity change, appetite change, fever  and unexpected weight change.  HENT: Negative for congestion and dental problem.   Eyes: Negative for photophobia and visual disturbance.  Respiratory: Negative for cough and shortness of breath.   Cardiovascular: Negative for chest pain, palpitations and leg swelling.  Gastrointestinal: Negative for abdominal pain, constipation and diarrhea.  Genitourinary: Negative for difficulty urinating and flank pain.  Musculoskeletal: Positive for back pain. Negative for arthralgias.       Back "aches " at times  Skin: Positive for color change.  Negative for rash and wound.       "Sore" on bottom  Psychiatric/Behavioral: Negative for dysphoric mood. The patient is not nervous/anxious.     BP (!) 152/88 (BP Location: Left Arm, Patient Position: Sitting, Cuff Size: Normal)   Pulse 88   Temp (!) 97.2 F (36.2 C) (Temporal)   Resp 18   Ht 5\' 11"  (1.803 m)   SpO2 98%   BMI 29.72 kg/m   Physical Exam  Constitutional: He appears well-developed and well-nourished.  Pleasant and congenial. In wheelchair.  HENT:  Head: Normocephalic and atraumatic.  Mouth/Throat: Oropharynx is clear and moist.  Eyes: Conjunctivae are normal. Pupils are equal, round, and reactive to light.  Neck: Normal range of motion.  Cardiovascular: Normal rate.  Pulses:      Dorsalis pedis pulses are 1+ on the right side, and 1+ on the left side.       Posterior tibial pulses are 1+ on the right side, and 1+ on the left side.  Irregular  Pulmonary/Chest: Effort normal and breath sounds normal. No respiratory distress. He has no wheezes.  Abdominal: Soft. Bowel sounds are normal.  Musculoskeletal: He exhibits edema.       Right foot: There is deformity.       Left foot: There is deformity.  Partial hemiparesis, right.  Point tenderness over R SI joint.  Trace edema of ankles  Feet:  Right Foot:  Protective Sensation: 5 sites tested. 0 sites sensed.  Skin Integrity: Negative for ulcer, blister or skin breakdown.  Left Foot:  Protective Sensation: 5 sites tested. 0 sites sensed.  Skin Integrity: Negative for ulcer, blister or skin breakdown.  Lymphadenopathy:    He has no cervical adenopathy.  Neurological: He is alert. A sensory deficit is present.  Skin:  2-3 cm red area on sacrum, blanchable, no open skin  Psychiatric: He has a normal mood and affect. His behavior is normal.    ASSESSMENT/PLAN:  1. Type 2 diabetes mellitus with stage 4 chronic kidney disease, without long-term current use of insulin (HCC) Well-controlled.  Complications  documented neuropathy, and kidney disease.  Due for eye exam. - CBC - COMPLETE METABOLIC PANEL WITH GFR - Hemoglobin A1c - Lipid panel - Urinalysis, Routine w reflex microscopic  2. Essential hypertension Controlled  3. Hemiparesis affecting right side as late effect of cerebrovascular accident (CVA) ([place]) Stable  4. Personal history of gout No active symptoms.  He remains on prevention.  Both of his great toes are thickened and immobile.  I believe this is likely from his gout.  5. Pressure injury of sacral region, stage 1 Will order another wheelchair pad  6. Needs flu shot Done today - Flu Vaccine QUAD 36+ mos IM   Patient Instructions  You are doing very well No change in medications Blood pressure and diabetes are good I have reordered a chair pad for you Immunizations today See me in 3 months Do not need laboratory tests 6 months   Kendrick Fries  Lysle Morales, MD

## 2017-01-09 ENCOUNTER — Other Ambulatory Visit: Payer: Self-pay

## 2017-01-09 MED ORDER — UNABLE TO FIND: 11 refills | Status: DC

## 2017-01-15 ENCOUNTER — Other Ambulatory Visit: Payer: Self-pay | Admitting: Family Medicine

## 2017-01-21 ENCOUNTER — Ambulatory Visit (HOSPITAL_COMMUNITY)
Admission: RE | Admit: 2017-01-21 | Discharge: 2017-01-21 | Disposition: A | Discharge status: Home / Self Care | Payer: Medicare Other | Source: Ambulatory Visit | Attending: Family Medicine | Admitting: Family Medicine

## 2017-01-21 ENCOUNTER — Ambulatory Visit (INDEPENDENT_AMBULATORY_CARE_PROVIDER_SITE_OTHER): Payer: Medicare Other | Admitting: Family Medicine

## 2017-01-21 ENCOUNTER — Other Ambulatory Visit: Payer: Self-pay

## 2017-01-21 ENCOUNTER — Encounter: Payer: Self-pay | Admitting: Family Medicine

## 2017-01-21 VITALS — BP 138/88 | HR 76 | Temp 97.5°F | Resp 18 | Ht 71.0 in

## 2017-01-21 DIAGNOSIS — Z981 Arthrodesis status: Secondary | ICD-10-CM | POA: Diagnosis not present

## 2017-01-21 DIAGNOSIS — M5441 Lumbago with sciatica, right side: Secondary | ICD-10-CM

## 2017-01-21 DIAGNOSIS — M5442 Lumbago with sciatica, left side: Secondary | ICD-10-CM | POA: Diagnosis not present

## 2017-01-21 MED ORDER — FOAM CHAIR CUSHION MISC: 0 refills | Status: DC

## 2017-01-21 MED ORDER — TRAMADOL HCL 50 MG PO TABS: 50.0000 mg | ORAL_TABLET | Freq: Three times a day (TID) | ORAL | 0 refills | Status: DC | PRN

## 2017-01-21 NOTE — Patient Instructions (Addendum)
Need back x ray Take the tramadol 2 - 3 a day as needed for pain Take a stool softener like colace every day that you take the pain medicine Call if not better in a few days

## 2017-01-21 NOTE — Progress Notes (Signed)
Chief Complaint  Patient presents with  . Back Pain   Patient is here for an acute visit.  He has back pain.  He states his back hurts with any movement for the last several days.  He is fine as long as he sits still, or lie still.  He is having difficulty transferring from his bed to his wheelchair, and from his wheelchair to the toilet and back.  Pain is mostly in the low back.  He states when it hurts, it shoots down both legs.  He cannot tell that there is any increased weakness.  No numbness.  No incontinence or bowel or bladder, change from his usual.  He has not tried any medications.  He is uncertain what he can take.  I advised him not to take any over-the-counter anti-inflammatories because of his diabetes and kidney failure.  He can take Tylenol for mild pain.  He states that he had an old Flexeril from a prior prescription and tried this, with no improvement. Patient does have a history of back problems.  He has had more than one back surgery.  His most recent surgery was a fusion L3-4-5 in 2006. He has not had any fall, injury, change in activity. No fever or urinary symptoms No past history of cancer  Patient Active Problem List   Diagnosis Date Noted  . Vitamin D deficiency 05/31/2016  . Essential hypertension 05/22/2016  . Hemiparesis affecting right side as late effect of cerebrovascular accident (CVA) ([place]) 05/22/2016  . Personal history of gout 05/22/2016  . BPH (benign prostatic hyperplasia) 05/22/2016  . Chronic constipation 05/22/2016  . HLD (hyperlipidemia) 05/22/2016  . Pseudophakia 12/06/2015  . Vascular dementia 10/23/2012  . Chronic kidney disease, stage III (moderate) ([place]) 10/20/2012  . Type 2 diabetes mellitus with diabetic chronic kidney disease ([place]) 12/16/2008  . ANKLE, ARTHRITIS, DEGEN./OSTEO 12/16/2008    Outpatient Encounter Medications as of 01/21/2017  Medication Sig  . aspirin EC 81 MG EC tablet Take 1 tablet (81 mg total) by mouth daily.  Marland Kitchen  atorvastatin (LIPITOR) 10 MG tablet Take 1 tablet (10 mg total) by mouth daily after supper.  . colchicine 0.6 MG tablet Take 1 tablet (0.6 mg total) by mouth daily.  Marland Kitchen docusate sodium (COLACE) 100 MG capsule Take 1 capsule (100 mg total) by mouth 2 (two) times daily.  Marland Kitchen glipiZIDE (GLUCOTROL XL) 2.5 MG 24 hr tablet Take 1 tablet (2.5 mg total) by mouth daily with breakfast.  . hydrALAZINE (APRESOLINE) 50 MG tablet Take 1 tablet (50 mg total) by mouth 2 (two) times daily at 8 am and 10 pm.  . LINZESS 290 MCG CAPS capsule   . lisinopril (PRINIVIL,ZESTRIL) 40 MG tablet Take 1 tablet (40 mg total) by mouth daily.  . metoprolol succinate (TOPROL-XL) 25 MG 24 hr tablet Take 1 tablet (25 mg total) by mouth daily.  . mirtazapine (REMERON) 7.5 MG tablet Take 1 tablet (7.5 mg total) by mouth at bedtime.  . Misc. Devices (FOAM CHAIR CUSHION) MISC Use in wheelchair/recliner  . tamsulosin (FLOMAX) 0.4 MG CAPS capsule Take 1 capsule (0.4 mg total) by mouth daily.  Marland Kitchen UNABLE TO FIND Under pads and pull ups, as needed.  Dx incontinence  . Vitamin D, Ergocalciferol, (DRISDOL) 50000 units CAPS capsule TAKE 1 CAPSULE BY MOUTH EVERY 7 DAYS.  Marland Kitchen Vitamin D, Ergocalciferol, (DRISDOL) 50000 units CAPS capsule TAKE 1 CAPSULE BY MOUTH EVERY 7 DAYS.  . [DISCONTINUED] Misc. Devices (FOAM CHAIR CUSHION) MISC Use  in wheelchair/recliner  . traMADol (ULTRAM) 50 MG tablet Take 1 tablet (50 mg total) by mouth every 8 (eight) hours as needed.   Facility-Administered Encounter Medications as of 01/21/2017  Medication  . methylPREDNISolone acetate (DEPO-MEDROL) injection 40 mg    No Known Allergies  Review of Systems  Constitutional: Negative for activity change, appetite change and unexpected weight change.  Gastrointestinal: Negative for constipation and diarrhea.  Genitourinary: Negative for difficulty urinating and flank pain.  Musculoskeletal: Positive for back pain and gait problem.  Psychiatric/Behavioral: Negative  for sleep disturbance. The patient is not nervous/anxious.   All other systems reviewed and are negative.   BP 138/88 (BP Location: Left Arm, Patient Position: Sitting, Cuff Size: Normal)   Pulse 76   Temp (!) 97.5 F (36.4 C) (Temporal)   Resp 18   Ht 5\' 11"  (1.803 m)   SpO2 99%   BMI 29.72 kg/m   Physical Exam  Constitutional: He appears well-developed and well-nourished. No distress.  Cardiovascular: Normal rate and regular rhythm.  Pulmonary/Chest: Effort normal and breath sounds normal. No respiratory distress.  Abdominal: Soft.  Musculoskeletal:  Back is straight and symmetric.  Neck has full range of motion with no tenderness.  No abnormality in thoracic spine.  There is diffuse tenderness to palpation centrally over the lumbar spinous processes, lumbar muscles, SI joints, and posterior sacrum.  No palpable muscle spasm.  Range of motion not tested secondary to wheelchair bound status.  Straight leg raise is negative bilaterally.  Reflexes trace at the knee bilaterally.  Nursing note and vitals reviewed.   ASSESSMENT/PLAN:  1. Acute bilateral low back pain with bilateral sciatica I feel this is mechanical back pain.  No evidence of nerve compression   DG Lumbar Spine Complete; IMPRESSION: Postsurgical and degenerative changes as described above. No acute abnormality seen in the lumbar spine.By: Marijo Conception, M.D.   On: 01/21/2017 13:21  Patient Instructions  Need back x ray Take the tramadol 2 - 3 a day as needed for pain Take a stool softener like colace every day that you take the pain medicine Call if not better in a few days   Raylene Everts, MD

## 2017-01-23 ENCOUNTER — Telehealth: Payer: Self-pay | Admitting: Family Medicine

## 2017-01-23 NOTE — Telephone Encounter (Signed)
Olivia Mackie called in regards the Results of the xray from Monday, please call Ivin Booty at [phone]

## 2017-01-23 NOTE — Telephone Encounter (Signed)
Called, aware of x ray results.

## 2017-01-23 NOTE — Telephone Encounter (Signed)
Arthritis, old surgery, nothing unexpected

## 2017-02-14 ENCOUNTER — Other Ambulatory Visit: Payer: Self-pay | Admitting: Family Medicine

## 2017-03-15 ENCOUNTER — Other Ambulatory Visit: Payer: Self-pay | Admitting: Family Medicine

## 2017-03-25 ENCOUNTER — Other Ambulatory Visit: Payer: Self-pay

## 2017-03-25 MED ORDER — UNABLE TO FIND: 3 refills | Status: DC

## 2017-03-28 ENCOUNTER — Ambulatory Visit: Payer: Medicare Other | Admitting: Family Medicine

## 2017-04-18 ENCOUNTER — Other Ambulatory Visit: Payer: Self-pay

## 2017-04-18 ENCOUNTER — Other Ambulatory Visit: Payer: Self-pay | Admitting: Family Medicine

## 2017-04-18 MED ORDER — TAMSULOSIN HCL 0.4 MG PO CAPS: 0.4000 mg | ORAL_CAPSULE | Freq: Two times a day (BID) | ORAL | 0 refills | Status: DC

## 2017-05-14 ENCOUNTER — Other Ambulatory Visit: Payer: Self-pay

## 2017-05-14 ENCOUNTER — Encounter: Payer: Self-pay | Admitting: Family Medicine

## 2017-05-14 ENCOUNTER — Ambulatory Visit (INDEPENDENT_AMBULATORY_CARE_PROVIDER_SITE_OTHER): Payer: Medicare Other | Admitting: Family Medicine

## 2017-05-14 VITALS — BP 138/88 | HR 87 | Temp 97.8°F | Resp 16 | Ht 71.0 in | Wt 239.0 lb

## 2017-05-14 DIAGNOSIS — S91109A Unspecified open wound of unspecified toe(s) without damage to nail, initial encounter: Secondary | ICD-10-CM

## 2017-05-14 DIAGNOSIS — N183 Chronic kidney disease, stage 3 unspecified: Secondary | ICD-10-CM

## 2017-05-14 DIAGNOSIS — E1122 Type 2 diabetes mellitus with diabetic chronic kidney disease: Secondary | ICD-10-CM

## 2017-05-14 DIAGNOSIS — I69351 Hemiplegia and hemiparesis following cerebral infarction affecting right dominant side: Secondary | ICD-10-CM

## 2017-05-14 DIAGNOSIS — E1140 Type 2 diabetes mellitus with diabetic neuropathy, unspecified: Secondary | ICD-10-CM

## 2017-05-14 DIAGNOSIS — K5909 Other constipation: Secondary | ICD-10-CM | POA: Diagnosis not present

## 2017-05-14 DIAGNOSIS — I10 Essential (primary) hypertension: Secondary | ICD-10-CM

## 2017-05-14 MED ORDER — POLYETHYLENE GLYCOL 3350 17 GM/SCOOP PO POWD: 17.0000 g | Freq: Every day | ORAL | 2 refills | Status: DC

## 2017-05-14 NOTE — Patient Instructions (Addendum)
Need to see podiatry every 3 months on a regular schedule No change in medicines I will add miralax daily Hold if bowels too loose Watch wounds on feet for infection  Need labs and follow up in 3 months Dr Mannie Stabile

## 2017-05-14 NOTE — Progress Notes (Signed)
Chief Complaint  Patient presents with  . acute bilateral low back pain with bilateral sciatica    follow up   Patient is here for routine follow-up. He has well-controlled diabetes.  He is compliant with medication.  He states he has a good appetite and "I eat whatever they bring me".  His last hemoglobin A1c was 5.6.  He is up-to-date with eye exam.  He is up-to-date with foot exam.  He sees a podiatrist, but has not gone for 6 months.  He is on a statin for his lipids.  He is not on an ACE inhibitor for his blood pressure.  He has known neuropathy and kidney disease.  He complains of no high or low blood sugars. His blood pressure is well controlled.  His initial blood pressure today was high, but it came down with rest. His last LDL was 88 which is slightly above goal. His review of system reveals chronic constipation not well controlled on Linzess.  He states he sometimes goes a week in between bowel movements.  I am going to add MiraLAX daily.  He is also advised to continue drinking plenty of water.  No blood in the bowels.  No weight loss. He complains that he saw some blood when he was putting on his shoes today.  He wants me to check his feet. Patient Active Problem List   Diagnosis Date Noted  . Vitamin D deficiency 05/31/2016  . Essential hypertension 05/22/2016  . Hemiparesis affecting right side as late effect of cerebrovascular accident (CVA) ([place]) 05/22/2016  . Personal history of gout 05/22/2016  . BPH (benign prostatic hyperplasia) 05/22/2016  . Chronic constipation 05/22/2016  . HLD (hyperlipidemia) 05/22/2016  . Pseudophakia 12/06/2015  . Vascular dementia 10/23/2012  . Chronic kidney disease, stage III (moderate) ([place]) 10/20/2012  . Type 2 diabetes mellitus with diabetic chronic kidney disease ([place]) 12/16/2008  . ANKLE, ARTHRITIS, DEGEN./OSTEO 12/16/2008    Outpatient Encounter Medications as of 05/14/2017  Medication Sig  . aspirin EC 81 MG EC tablet Take 1  tablet (81 mg total) by mouth daily.  Marland Kitchen atorvastatin (LIPITOR) 10 MG tablet TAKE 1 TABLET BY MOUTH AFTER SUPPER.  Marland Kitchen colchicine 0.6 MG tablet Take 1 tablet (0.6 mg total) by mouth daily.  Marland Kitchen docusate sodium (COLACE) 100 MG capsule Take 1 capsule (100 mg total) by mouth 2 (two) times daily.  Marland Kitchen glipiZIDE (GLUCOTROL XL) 2.5 MG 24 hr tablet TAKE ONE TABLET BY MOUTH DAILY.  . hydrALAZINE (APRESOLINE) 50 MG tablet TAKE 1 TABLET BY MOUTH EACH MORNING AND EVENING.  Marland Kitchen LINZESS 290 MCG CAPS capsule   . lisinopril (PRINIVIL,ZESTRIL) 40 MG tablet Take 1 tablet (40 mg total) by mouth daily.  . metoprolol succinate (TOPROL-XL) 25 MG 24 hr tablet Take 1 tablet (25 mg total) by mouth daily.  . mirtazapine (REMERON) 7.5 MG tablet TAKE 1 TABLET BY MOUTH AT BEDTIME.  . Misc. Devices (FOAM CHAIR CUSHION) MISC Use in wheelchair/recliner  . tamsulosin (FLOMAX) 0.4 MG CAPS capsule Take 1 capsule (0.4 mg total) by mouth 2 (two) times daily.  Marland Kitchen UNABLE TO FIND pullups 72/m, underpads 150/m Dx incontinence  . Vitamin D, Ergocalciferol, (DRISDOL) 50000 units CAPS capsule TAKE 1 CAPSULE BY MOUTH EVERY 7 DAYS.  Marland Kitchen memantine (NAMENDA) 10 MG tablet   . polyethylene glycol powder (GLYCOLAX/MIRALAX) powder Take 17 g by mouth daily.   No facility-administered encounter medications on file as of 05/14/2017.     No Known Allergies  Review  of Systems  Constitutional: Negative for activity change, appetite change and unexpected weight change.  HENT: Negative for congestion, dental problem and hearing loss.   Eyes: Negative for photophobia and visual disturbance.  Respiratory: Negative for cough and shortness of breath.   Cardiovascular: Positive for leg swelling. Negative for chest pain and palpitations.  Gastrointestinal: Positive for constipation. Negative for abdominal pain, blood in stool and diarrhea.  Genitourinary: Negative for difficulty urinating and flank pain.  Musculoskeletal: Positive for back pain and gait problem.        Chronic pain  Neurological: Positive for weakness. Negative for headaches.       Focal weakness/hemiparesis from stroke  Psychiatric/Behavioral: Negative for sleep disturbance. The patient is not nervous/anxious.        Patient does have early dementia, but he does not recognize this.  States his memory and mood are fine  All other systems reviewed and are negative.   Physical Exam  Constitutional: He appears well-developed and well-nourished.  Pleasant and congenial. In wheelchair.  HENT:  Head: Normocephalic and atraumatic.  Mouth/Throat: Oropharynx is clear and moist.  Eyes: Pupils are equal, round, and reactive to light. Conjunctivae are normal.  Neck: Normal range of motion.  Cardiovascular: Normal rate.  Irregular  Pulmonary/Chest: Effort normal and breath sounds normal. No respiratory distress. He has no wheezes.  Abdominal: Soft. Bowel sounds are normal.  Musculoskeletal: He exhibits edema.  Partial hemiparesis, right.  Point tenderness over R SI joint.  1+ edema, skin on lower legs is dry.  Discussed with his aide  Lymphadenopathy:    He has no cervical adenopathy.  Neurological: He is alert. A sensory deficit is present.  Skin:  The feet are examined.  Mild edema.  As previously documented he has little sensation in his feet, and poor pulses.  The toenails are long, and the toenail on the third toe of the left foot curves towards the second toe, and has rubbed a blister that is open and bleeding.  This is cleaned and a Band-Aid is placed.  The toenails are trimmed.  Psychiatric: He has a normal mood and affect. His behavior is normal.    BP 138/88   Pulse 87   Temp 97.8 F (36.6 C) (Oral)   Resp 16   Ht 5\' 11"  (1.803 m)   Wt 239 lb 0.6 oz (108.4 kg)   SpO2 98%   BMI 33.34 kg/m     ASSESSMENT/PLAN:  1. Type 2 diabetes mellitus with stage 3 chronic kidney disease, without long-term current use of insulin ([facility]) Controlled on glipizide  2. Essential  hypertension Controlled on current medications  3. Open wound of toe, initial encounter From lack of podiatry and nail care.  Will refer back  4. Type 2 diabetes mellitus with diabetic neuropathy, without long-term current use of insulin ([facility]) Numbness but no pain  5. Hemiparesis affecting right side as late effect of cerebrovascular accident (CVA) ([place]) Stable  6. Constipation, chronic Discussed.  Fiber, fluids, plenty of water.  Increase mobility.  Will add MiraLAX.   Patient Instructions  Need to see podiatry every 3 months on a regular schedule No change in medicines I will add miralax daily Hold if bowels too loose Watch wounds on feet for infection  Need labs and follow up in 3 months Dr Mannie Stabile    Raylene Everts, MD

## 2017-05-17 ENCOUNTER — Other Ambulatory Visit: Payer: Self-pay | Admitting: Family Medicine

## 2017-07-01 ENCOUNTER — Other Ambulatory Visit: Payer: Self-pay | Admitting: Family Medicine

## 2017-07-04 ENCOUNTER — Encounter: Payer: Self-pay | Admitting: Family Medicine

## 2017-07-05 ENCOUNTER — Encounter: Payer: Self-pay | Admitting: Family Medicine

## 2017-07-17 ENCOUNTER — Telehealth: Payer: Self-pay | Admitting: Family Medicine

## 2017-07-17 NOTE — Telephone Encounter (Signed)
Called patient to reschedule appt. No answer, no voicemail. Mailed letter.

## 2017-07-19 ENCOUNTER — Encounter: Payer: Self-pay | Admitting: Family Medicine

## 2017-08-07 ENCOUNTER — Other Ambulatory Visit: Payer: Self-pay | Admitting: Family Medicine

## 2017-08-14 ENCOUNTER — Ambulatory Visit: Payer: Medicare Other | Admitting: Family Medicine

## 2017-09-06 ENCOUNTER — Encounter: Payer: Self-pay | Admitting: Family Medicine

## 2017-09-06 ENCOUNTER — Ambulatory Visit (INDEPENDENT_AMBULATORY_CARE_PROVIDER_SITE_OTHER): Payer: Medicare Other | Admitting: Family Medicine

## 2017-09-06 VITALS — BP 162/82 | HR 89 | Resp 16 | Ht 71.0 in | Wt 232.1 lb

## 2017-09-06 DIAGNOSIS — E1122 Type 2 diabetes mellitus with diabetic chronic kidney disease: Secondary | ICD-10-CM

## 2017-09-06 DIAGNOSIS — R5383 Other fatigue: Secondary | ICD-10-CM

## 2017-09-06 DIAGNOSIS — H6122 Impacted cerumen, left ear: Secondary | ICD-10-CM | POA: Diagnosis not present

## 2017-09-06 DIAGNOSIS — N184 Chronic kidney disease, stage 4 (severe): Secondary | ICD-10-CM

## 2017-09-06 DIAGNOSIS — G8929 Other chronic pain: Secondary | ICD-10-CM

## 2017-09-06 DIAGNOSIS — R2689 Other abnormalities of gait and mobility: Secondary | ICD-10-CM

## 2017-09-06 DIAGNOSIS — Z8673 Personal history of transient ischemic attack (TIA), and cerebral infarction without residual deficits: Secondary | ICD-10-CM

## 2017-09-06 DIAGNOSIS — I10 Essential (primary) hypertension: Secondary | ICD-10-CM

## 2017-09-06 DIAGNOSIS — M545 Low back pain: Secondary | ICD-10-CM

## 2017-09-06 DIAGNOSIS — R269 Unspecified abnormalities of gait and mobility: Secondary | ICD-10-CM | POA: Diagnosis not present

## 2017-09-06 DIAGNOSIS — Z23 Encounter for immunization: Secondary | ICD-10-CM

## 2017-09-06 NOTE — Progress Notes (Signed)
Patient ID: Mike Briggs, male    DOB: 19-Nov-1928, 84 y.o.   MRN: [id number]  Chief Complaint  Patient presents with  . Establish Care    former [facility] pt   . Hearing Problem    states he has been having worsening of his hearing   . Hypertension    Allergies Patient has no known allergies.  Subjective:   Mike Briggs is a 84 y.o. male who presents to [facility] today.  HPI Mike Briggs is a very nice 84 year old African-American male who presents to the office today.  He is accompanied by friend of his for the visit.  He is previously been seen by Dr. Lysle Morales and prior to that was seen by Dr. Woody Seller.  Mike Briggs lives alone.  He is no longer married.  He has several children but does not have contact with him.  He is retired and has not worked for many years.  He reports that he lives alone.  He does have nursing assistance that come daily. Meals on Wheels comes Monday-Friday, 9-10am. Nursing assistants comes 8:30-2:30, every day of the week. He has had quite a few falls from transferring from his wheelchair to bed or his other reclining chair.  He reports he has not sustained any injuries.  He has no skin lesions per his report.  He does take care of all his ADLs but has some difficulty.  He transfers into a tub seat to shower himself.  He is not able to cook for himself and his nursing assistant prepares his evening meal before she leaves at 2 PM.  He does not always remember to take his medications and often forgets to take his evening meds.  The nursing assistants do not give him his medications but they remind him.  He reports that he takes 8 pills in the morning and 5 pills in the evening although the report which I received from [facility] states that his pill packs have 5 pills in them.  They did not bring his medications with him to the office today nor did they bring his pill packs.  He did not take his medicines last night.  He reports that he believes  he took them this morning. He reports that his mood is good.  His energy is good.  He denies any chest pain, shortness of breath, or swelling in his extremities.  He has never had any diabetic foot ulcers or amputations.  He has had a stroke in the past.  He denies any history of heart attack.  He does not check his sugars.  He does have issues with his bladder and if he waits too long he cannot always make it to the bathroom in time.  He does take medicines for his prostate.  He reports some urinary urgency but no hesitancy.  He denies any hypoglycemic episodes. He reports that he does not feel like he can hear well out of his left ear.  He denies any ear pain.  He denies any melena or bright red blood per rectum.  He reports he does have chronic right-sided low back pain.  He reports that he would like to see someone for his back. His med list states that he is on Remeron and Namenda.  He reports he has never been told that he has dementia.  His friend that is with him states that he has a good memory.  He takes care of his bathing  and ADLs.  He does not forget where he puts objects in his house.  He is unable to drive.  He is unable to cook his foods.  His friend reports that his limitations are mainly due to his inability to walk.  Patient reports that he is unable to walk due to his muscle weakness in his back pain.  He is never received any physical therapy or occupational therapy due to his weakness.  He reports that over time after his stroke he became weak and was unable to walk.  He reports that his gait is unstable and he does not walk without assistance because he is afraid he will fall.  He can only take a couple steps.  He denies any abdominal pain.  He takes Linzess for constipation and reports that it helps.  His bowel movements are daily.     Past Medical History:  Diagnosis Date  . Arthritis   . Cataract   . Chronic kidney disease   . Depression   . Diabetes mellitus   .  Hyperlipidemia   . Hypertension   . Stroke [facility])     Past Surgical History:  Procedure Laterality Date  . BACK SURGERY     cervical and lumbar  . CATARACT EXTRACTION    . CHOLECYSTECTOMY    . FOOT SURGERY     5th toe   . SPINE SURGERY     lumbar and cervical fusions    Family History  Family history unknown: Yes     Social History   Socioeconomic History  . Marital status: Divorced    Spouse name: Not on file  . Number of children: 3  . Years of education: 10  . Highest education level: Not on file  Occupational History  . Occupation: Retired.  Social Needs  . Financial resource strain: Not on file  . Food insecurity:    Worry: Not on file    Inability: Not on file  . Transportation needs:    Medical: Not on file    Non-medical: Not on file  Tobacco Use  . Smoking status: Former Smoker    Last attempt to quit: 08/07/1967    Years since quitting: 50.1  . Smokeless tobacco: Never Used  Substance and Sexual Activity  . Alcohol use: No  . Drug use: No  . Sexual activity: Not Currently  Lifestyle  . Physical activity:    Days per week: Not on file    Minutes per session: Not on file  . Stress: Not on file  Relationships  . Social connections:    Talks on phone: Not on file    Gets together: Not on file    Attends religious service: Not on file    Active member of club or organization: Not on file    Attends meetings of clubs or organizations: Not on file    Relationship status: Not on file  Other Topics Concern  . Not on file  Social History Narrative   Wheelchair bound.Has not walked in many years, at least five years.  Lives alone. Has 2 aides that come in to help him.   Lives alone.    Retired.   Eats all food groups.    Has 3 children but does not have contact with them.   Divorced.   Current Outpatient Medications on File Prior to Visit  Medication Sig Dispense Refill  . aspirin EC 81 MG EC tablet Take 1 tablet (81 mg total) by mouth daily.    Marland Kitchen  atorvastatin (LIPITOR) 10 MG tablet TAKE 1 TABLET BY MOUTH AFTER SUPPER. 90 tablet 0  . colchicine 0.6 MG tablet TAKE 1 TABLET BY MOUTH ONCE DAILY. 90 tablet 0  . docusate sodium (COLACE) 100 MG capsule Take 1 capsule (100 mg total) by mouth 2 (two) times daily. 180 capsule 3  . glipiZIDE (GLUCOTROL XL) 2.5 MG 24 hr tablet TAKE ONE TABLET BY MOUTH DAILY. 90 tablet 0  . glipiZIDE (GLUCOTROL XL) 2.5 MG 24 hr tablet TAKE ONE TABLET BY MOUTH DAILY. 90 tablet 0  . hydrALAZINE (APRESOLINE) 50 MG tablet TAKE 1 TABLET BY MOUTH EACH MORNING AND EVENING. 180 tablet 0  . LINZESS 290 MCG CAPS capsule     . lisinopril (PRINIVIL,ZESTRIL) 40 MG tablet TAKE (1) TABLET BY MOUTH ONCE DAILY. 90 tablet 0  . memantine (NAMENDA) 10 MG tablet     . metoprolol succinate (TOPROL-XL) 25 MG 24 hr tablet TAKE (1) TABLET BY MOUTH ONCE DAILY. 90 tablet 0  . mirtazapine (REMERON) 7.5 MG tablet TAKE 1 TABLET BY MOUTH AT BEDTIME. 90 tablet 0  . Misc. Devices (FOAM CHAIR CUSHION) MISC Use in wheelchair/recliner 1 each 0  . polyethylene glycol powder (GLYCOLAX/MIRALAX) powder Take 17 g by mouth daily. 578 g 2  . tamsulosin (FLOMAX) 0.4 MG CAPS capsule TAKE 1 CAPSULE BY MOUTH TWICE DAILY. 180 capsule 0  . UNABLE TO FIND pullups 72/m, underpads 150/m Dx incontinence 100 each 3  . Vitamin D, Ergocalciferol, (DRISDOL) 50000 units CAPS capsule TAKE 1 CAPSULE BY MOUTH EVERY 7 DAYS. 4 capsule 0   No current facility-administered medications on file prior to visit.     Review of Systems  Constitutional: Negative for appetite change, chills, fatigue, fever and unexpected weight change.  HENT: Positive for hearing loss. Negative for tinnitus, trouble swallowing and voice change.        Reports that hearing is decreased in his left ear.  Eyes: Negative for visual disturbance.  Respiratory: Negative for cough, chest tightness, shortness of breath and wheezing.   Cardiovascular: Negative for chest pain, palpitations and leg swelling.    Gastrointestinal: Negative for abdominal pain, diarrhea, nausea and vomiting.  Genitourinary: Negative for decreased urine volume, dysuria and frequency.  Musculoskeletal: Positive for back pain. Negative for joint swelling and neck pain.       Reports has had chronic right-sided back pain for many years.  Reports he has told many doctors about this but is never been evaluated.  Would like to see an orthopedic doctor.  Would be interested in doing some strengthening and physical therapy.  Skin: Negative for rash.  Neurological: Positive for weakness. Negative for dizziness, tremors, syncope, facial asymmetry, numbness and headaches.       Patient reports that he has had weakness in his body over many years.  Worse after his stroke.  Reports that he is somewhat unstable and is unable to walk anymore.  Has been in wheelchair for over 5 years.  Just transfers from bed to chair.  Hematological: Negative for adenopathy. Does not bruise/bleed easily.  Psychiatric/Behavioral: Negative for behavioral problems, confusion, decreased concentration, dysphoric mood, hallucinations, self-injury, sleep disturbance and suicidal ideas. The patient is not nervous/anxious and is not hyperactive.      Objective:   BP (!) 162/82   Pulse 89   Resp 16   Ht 5\' 11"  (1.803 m)   Wt 232 lb 1.9 oz (105.3 kg)   SpO2 97%   BMI 32.37 kg/m   Physical Exam  Constitutional: He is oriented to person, place, and time. He appears well-developed and well-nourished.  Pleasant elderly male sitting in wheelchair in no acute distress.  Answers questions appropriately.  Alert and oriented.  HENT:  Head: Normocephalic and atraumatic.  Right Ear: Tympanic membrane, external ear and ear canal normal.  Left Ear: Tympanic membrane, external ear and ear canal normal.  Nose: Nose normal.  Mouth/Throat: Oropharynx is clear and moist.  Left ear canal initially obstructed with cerumen.  Ear canal lavaged.  Removal of large amount of  cerumen.  Hearing improved.  Tympanic membrane clear.  Eyes: Pupils are equal, round, and reactive to light. EOM are normal.  Neck: Normal range of motion. Neck supple. No thyromegaly present.  Cardiovascular: Normal rate, regular rhythm and normal heart sounds.  Pulmonary/Chest: Effort normal and breath sounds normal.  Musculoskeletal: He exhibits no edema.  Neurological: He is alert and oriented to person, place, and time. He has normal strength. No cranial nerve deficit. GCS eye subscore is 4. GCS verbal subscore is 5. GCS motor subscore is 6.  Patient is alert and oriented to year, place, time, and person.  He is able to follow commands.  He is able to name 3 objects.  Speech intact.  Gross motor strength is intact in upper and lower extremities.  Grip strength strong bilaterally.  Sensation intact bilaterally.  Skin: Skin is warm, dry and intact.  Psychiatric: He has a normal mood and affect. His speech is normal and behavior is normal. Judgment and thought content normal. His mood appears not anxious. Cognition and memory are normal. He does not exhibit a depressed mood.  Vitals reviewed.   Depression screen [facility] 2/9 09/06/2017 05/14/2017 12/25/2016 09/04/2016 05/22/2016  Decreased Interest 0 0 0 0 0  Down, Depressed, Hopeless 0 0 0 0 0  PHQ - 2 Score 0 0 0 0 0  Altered sleeping 2 - - - -  Tired, decreased energy 2 - - - -  Change in appetite 0 - - - -  Feeling bad or failure about yourself  0 - - - -  Trouble concentrating 2 - - - -  Moving slowly or fidgety/restless 3 - - - -  Suicidal thoughts 0 - - - -  PHQ-9 Score 9 - - - -  Difficult doing work/chores Somewhat difficult - - - -    Assessment and Plan  1. Type 2 diabetes mellitus with stage 4 chronic kidney disease, without long-term current use of insulin ([place]) Check labs.  Unsure at this time if patient is just on the glipizide XL.  His A1c at last check was approximately 5.7%.  Discussed with patient if his A1c is controlled we  will consider discontinuing this medication. - Ambulatory referral to Ophthalmology - COMPLETE METABOLIC PANEL WITH GFR - Hemoglobin A1c  2. Need for Tdap vaccination Vaccination given.  3. Essential hypertension Blood pressure is elevated today although I am not sure about patient compliance.  Will call [facility] to discuss the discrepancies in patient report of medications and pill pack and the list that they have sent over.  Patient will follow-up next week with nursing aide and all of his medications.  We will review and discuss at that time.  In addition, we need to check his potassium and creatinine to make sure that they are still at acceptable levels for medications which are listed.  4. Gait disturbance Patient has disturbance of gait and weakness with reported history of loss of balance  and fall.  I think he would be a great candidate for physical therapy, occupational therapy, and home health to do an evaluation for assistive devices and help with transferring.  Patient is excited about this opportunity.  Referral will be placed today. - Ambulatory referral to Physical Therapy - Ambulatory referral to Occupational Therapy - Ambulatory referral to [place]  5. Chronic right-sided low back pain, with sciatica presence unspecified Patient has chronic pain in his back per his report.  Will refer for evaluation and possible x-rays.  We will see if this is 1 of the limiting factors of his gait disturbance. - Ambulatory referral to Sports Medicine - Ambulatory referral to Physical Therapy  6. Loss of balance - Ambulatory referral to Physical Therapy - Ambulatory referral to Occupational Therapy - Ambulatory referral to Home Health  7. History of CVA (cerebrovascular accident) We will check cholesterol.  We will also check medication and see if he is on proper medications. - Lipid panel  8. Other fatigue - CBC with Differential/Platelet  9. Impacted cerumen of left  ear Ears lavaged with success.  Hearing improved.  Ear care discussed with patient. - Ear Lavage  Patient wishes to proceed with increased home health services.  I believe that patient would benefit from having someone in the home to assist him and ensure he gets his medications.  Patient does not wish to be in an assisted living but would rather stay in his home.  We will see if he is eligible for services.  He is a fall risk.  I do believe that physical therapy could help decrease his fall risk.  He will follow-up in 1 week and they will bring medications to that visit.  We will check labs today.  Depending on his labs, I do believe that we can make adjustments and possibly decrease some of his medications.  In addition he may be a candidate for a clonidine patch rather than multiple dosing of hydralazine.  However, before we can make changes we need to know what his actual medications are. Return in about 1 week (around 09/13/2017). Caren Macadam, MD 09/06/2017

## 2017-09-10 ENCOUNTER — Encounter: Payer: Self-pay | Admitting: Family Medicine

## 2017-09-10 ENCOUNTER — Other Ambulatory Visit: Payer: Self-pay

## 2017-09-10 ENCOUNTER — Telehealth: Payer: Self-pay | Admitting: Family Medicine

## 2017-09-10 ENCOUNTER — Ambulatory Visit (INDEPENDENT_AMBULATORY_CARE_PROVIDER_SITE_OTHER): Payer: Medicare Other | Admitting: Family Medicine

## 2017-09-10 VITALS — BP 136/72 | HR 88 | Temp 98.6°F | Resp 12 | Ht 71.0 in | Wt 232.0 lb

## 2017-09-10 DIAGNOSIS — E559 Vitamin D deficiency, unspecified: Secondary | ICD-10-CM | POA: Diagnosis not present

## 2017-09-10 DIAGNOSIS — N400 Enlarged prostate without lower urinary tract symptoms: Secondary | ICD-10-CM

## 2017-09-10 DIAGNOSIS — F0150 Vascular dementia without behavioral disturbance: Secondary | ICD-10-CM

## 2017-09-10 DIAGNOSIS — I10 Essential (primary) hypertension: Secondary | ICD-10-CM

## 2017-09-10 DIAGNOSIS — I69351 Hemiplegia and hemiparesis following cerebral infarction affecting right dominant side: Secondary | ICD-10-CM | POA: Diagnosis not present

## 2017-09-10 DIAGNOSIS — E1122 Type 2 diabetes mellitus with diabetic chronic kidney disease: Secondary | ICD-10-CM

## 2017-09-10 DIAGNOSIS — N184 Chronic kidney disease, stage 4 (severe): Secondary | ICD-10-CM

## 2017-09-10 DIAGNOSIS — E782 Mixed hyperlipidemia: Secondary | ICD-10-CM

## 2017-09-10 NOTE — Patient Instructions (Signed)
Stop the hydralazine. Stop the mirtazepine.  Change the atorvastatin, memantidine, and tamulosin to late afternoon dosing.  Script given for mattress.

## 2017-09-10 NOTE — Telephone Encounter (Signed)
#   [place] with [facility] taking care to this patient.

## 2017-09-10 NOTE — Progress Notes (Signed)
Patient ID: Mike Briggs, male    DOB: 17-Feb-1928, 84 y.o.   MRN: [id number]  Chief Complaint  Patient presents with  . Medication Management    Allergies Patient has no known allergies.  Subjective:   Mike Briggs is a 84 y.o. male who presents to [facility] today.  HPI Here for follow up.  Is accompanied by Tracey/his aide. She is with him form 8:30-2:30, 7 days a week.  He does have a case worker/Phyliis, through [facility]. She does check on him once a month.  Nursing assistant reports that he needs a prescription for a mattress. Can get a new mattress every three years. Patient reports he would like a mattress that would help with support and help with hip. Current  Mattress is very worn and thin.   Has not gotten his labs done yet.  Was supposed to get them drawn last week.  Brings in pill packs today to the office visit. Has not taken his medications yet this morning and did not take his evening medicines yesterday.  Is on lisinopril 40 mg a day and hydralazine 50 mg twice a day for blood pressure.  He usually does not get his evening dose of hydralazine.  His blood pressures been running well at home.  He reports that he feels good.  He denies any chest pain.  Denies any shortness of breath.  He does report that he gets swelling in his feet and has had this for many years.  He is on Namenda 10 mg twice a day for his memory.  His nursing aide reports that he was put on this over 3 years ago by Dr. Woody Seller.  Reports that initially he was on 2 medications for his memory but they made him very sleepy.  He was taken off the other medication and kept on the [place].  She reports that she has seen improvement in his memory and his memory has not gotten worse.   Initially patient was diagnosed with diabetes many years ago.  He subsequently has been on medications.  His nursing aide reports that since she has been with him they have worked very hard on his  diet.  He does not eat sweets other than occasional treat.  He is still been continued on the glipizide XL.  His last hemoglobin A1c was less than 6%.  They do not report any hypoglycemia but they do not check his sugars.  He has not had any lesions on his feet.  He has a long history of peripheral neuropathy and swelling in his feet.  He does not have any lesions on his feet or ulcerations. He does take Linzess for chronic constipation.  He reports that with this medication his bowel movements are normal.  He has not been using the Colace. They have not heard regarding the physical therapy or occupational therapy.  He is interested in doing physical therapy to help with his muscle strength.  They report that he can transfer from chair to bed or bed to chair but he is very weak.  He has not had any recent falls. He is taking his prostate medication twice a day.  Denies any new issues with his prostate.  He reports if he holds his urine for too long he cannot make it to the bathroom.  Reports he makes good amount of urine on a daily basis. Was placed on mirtazapine by previous PCP for difficulty sleeping.  Nursing aide  reports that he sleeps very well.  They report that he has been sleeping good.  He often misses the mirtazapine and does not take it because he is missing his evening meds.  Patient and nursing aide and friend reports that his mood is good.  He does not feel down, depressed, or hopeless.  He has a dog.  Has contact with his friends and occasionally goes to church.  He is on a medication for cholesterol.  No side effects with that they do not wish to stop this medication.  Has been on this since he had a stroke. Is on colchicine for gout.  Has not had a gout episode in a very long time. Nursing aide reports that she does not know or is never heard that he has any kidney dysfunction.    Past Medical History:  Diagnosis Date  . Arthritis   . Cataract   . Chronic kidney disease   .  Depression   . Diabetes mellitus   . Hyperlipidemia   . Hypertension   . Stroke [facility])     Past Surgical History:  Procedure Laterality Date  . BACK SURGERY     cervical and lumbar  . CATARACT EXTRACTION    . CHOLECYSTECTOMY    . FOOT SURGERY     5th toe   . SPINE SURGERY     lumbar and cervical fusions    Family History  Family history unknown: Yes     Social History   Socioeconomic History  . Marital status: Divorced    Spouse name: Not on file  . Number of children: 3  . Years of education: 10  . Highest education level: Not on file  Occupational History  . Occupation: Retired.  Social Needs  . Financial resource strain: Not on file  . Food insecurity:    Worry: Not on file    Inability: Not on file  . Transportation needs:    Medical: Not on file    Non-medical: Not on file  Tobacco Use  . Smoking status: Former Smoker    Last attempt to quit: 08/07/1967    Years since quitting: 50.1  . Smokeless tobacco: Never Used  Substance and Sexual Activity  . Alcohol use: No  . Drug use: No  . Sexual activity: Not Currently  Lifestyle  . Physical activity:    Days per week: Not on file    Minutes per session: Not on file  . Stress: Not on file  Relationships  . Social connections:    Talks on phone: Not on file    Gets together: Not on file    Attends religious service: Not on file    Active member of club or organization: Not on file    Attends meetings of clubs or organizations: Not on file    Relationship status: Not on file  Other Topics Concern  . Not on file  Social History Narrative   Wheelchair bound.Has not walked in many years, at least five years.  Lives alone. Has 2 aides that come in to help him.   Lives alone.    Retired.   Eats all food groups.    Has 3 children but does not have contact with them.   Divorced.   Current Outpatient Medications on File Prior to Visit  Medication Sig Dispense Refill  . aspirin EC 81 MG EC tablet Take 1  tablet (81 mg total) by mouth daily.    Marland Kitchen atorvastatin (LIPITOR) 10 MG  tablet TAKE 1 TABLET BY MOUTH AFTER SUPPER. 90 tablet 0  . colchicine 0.6 MG tablet TAKE 1 TABLET BY MOUTH ONCE DAILY. 90 tablet 0  . glipiZIDE (GLUCOTROL XL) 2.5 MG 24 hr tablet TAKE ONE TABLET BY MOUTH DAILY. 90 tablet 0  . LINZESS 290 MCG CAPS capsule     . lisinopril (PRINIVIL,ZESTRIL) 40 MG tablet TAKE (1) TABLET BY MOUTH ONCE DAILY. 90 tablet 0  . memantine (NAMENDA) 10 MG tablet     . metoprolol succinate (TOPROL-XL) 25 MG 24 hr tablet TAKE (1) TABLET BY MOUTH ONCE DAILY. 90 tablet 0  . Misc. Devices (FOAM CHAIR CUSHION) MISC Use in wheelchair/recliner 1 each 0  . tamsulosin (FLOMAX) 0.4 MG CAPS capsule TAKE 1 CAPSULE BY MOUTH TWICE DAILY. 180 capsule 0  . UNABLE TO FIND pullups 72/m, underpads 150/m Dx incontinence 100 each 3  . Vitamin D, Ergocalciferol, (DRISDOL) 50000 units CAPS capsule TAKE 1 CAPSULE BY MOUTH EVERY 7 DAYS. 4 capsule 0   No current facility-administered medications on file prior to visit.     Review of Systems  Constitutional: Negative for appetite change, chills, fever and unexpected weight change.  HENT: Negative for trouble swallowing and voice change.   Eyes: Negative for visual disturbance.  Respiratory: Negative for cough, chest tightness, shortness of breath and wheezing.   Cardiovascular: Positive for leg swelling. Negative for chest pain and palpitations.       No swelling in his legs but has had swelling in his feet for many years.  Gastrointestinal: Negative for abdominal pain, diarrhea, nausea and vomiting.  Genitourinary: Negative for decreased urine volume, dysuria and frequency.  Musculoskeletal: Negative for myalgias.  Skin: Negative for rash.  Neurological: Negative for dizziness, tremors, syncope, facial asymmetry, weakness and headaches.  Hematological: Negative for adenopathy. Does not bruise/bleed easily.  Psychiatric/Behavioral: Negative for agitation, behavioral  problems, decreased concentration, dysphoric mood, self-injury and sleep disturbance. The patient is not nervous/anxious.      Objective:   BP 136/72 (BP Location: Right Arm, Patient Position: Sitting, Cuff Size: Large)   Pulse 88   Temp 98.6 F (37 C) (Temporal)   Resp 12   Ht 5\' 11"  (1.803 m)   Wt 232 lb (105.2 kg)   SpO2 96% Comment: room air  BMI 32.36 kg/m   Physical Exam  Constitutional: He is oriented to person, place, and time. He appears well-developed and well-nourished. No distress.  Eyes: Pupils are equal, round, and reactive to light. Conjunctivae and EOM are normal.  Cardiovascular: Normal rate, regular rhythm and normal heart sounds.  Trace DP pulses bilaterally.  Extremities warm bilaterally with no cutaneous temperature difference.  Pulmonary/Chest: Effort normal and breath sounds normal.  Neurological: He is alert and oriented to person, place, and time.  Right-sided weakness greater than left, chronic.  Skin: Skin is warm. He is not diaphoretic.  Trace edema in lower extremities bilaterally.  Trace to 1+ edema in feet bilaterally.  No lesions on feet.  Skin intact.  Skin is dry bilaterally and legs and feet.  Psychiatric: He has a normal mood and affect. His behavior is normal. Judgment and thought content normal.     Assessment and Plan   1. Type 2 diabetes mellitus with stage 4 chronic kidney disease, without long-term current use of insulin (HCC) Check hemoglobin A1c today.  If still within reasonable level of less than 7% will DC Glucotrol XL.  Continue with dietary modifications.  2. Essential hypertension Blood pressures well controlled and  he is even missed his doses of hydralazine.  Will discontinue hydralazine at this time.  Check kidney function today to make sure it is still within normal levels for being on the lisinopril.  If he does need additional medication we will consider adding diuretic.  When he follows up could talk about decreasing the  lisinopril and adding diuretic.  This does all depend upon his kidney function.  Continue with salt restriction.  Also recommend elevating legs some during the day.  3. Hemiparesis affecting right side as late effect of cerebrovascular accident (CVA) ([place]) We will check on status of PT/OT referral.  Given a prescription for a manual wheelchair.  Given a prescription for a mattress.  4. Vitamin D deficiency Check vitamin D levels.  5. Mixed hyperlipidemia Patient wishes to continue with the statin medication.  Check labs.  Will adjust this to afternoon dosing.  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  However they do wish to continue medication for dementia at this time.  Risks versus benefits discussed.  They do wish to continue it.  Change the dosing to where they can take them in the late afternoon prior to nursing a leaving so that we can get all of his afternoon medications in. She does not exhibit symptoms of depression or decreased mood.  Will DC mirtazapine at this time. Was told that if friends/caretakers notice a change in his mood to please call or contact our office.  I do not believe this will be the case since I suspect he has been missing most of his doses. We will also change his tamsulosin, p.m. dose, to afternoon dosing so that he does not miss his doses. We will get labs today and follow-up in 1 month.  Will administer flu shot at that time.  They were told to call with any questions or concerns. Return in about 4 weeks (around 10/08/2017). Caren Macadam, MD 09/10/2017

## 2017-09-11 LAB — HEMOGLOBIN A1C
EAG (MMOL/L): 7.1 (calc)
HEMOGLOBIN A1C: 6.1 %{Hb} — AB (ref <5.7)
MEAN PLASMA GLUCOSE: 128 (calc)

## 2017-09-11 LAB — CBC WITH DIFFERENTIAL/PLATELET
Basophils Absolute: 40 cells/uL (ref 0–200)
Basophils Relative: 0.5 %
EOS ABS: 213 {cells}/uL (ref 15–500)
Eosinophils Relative: 2.7 %
HCT: 39.1 % (ref 38.5–50.0)
Hemoglobin: 13 g/dL — ABNORMAL LOW (ref 13.2–17.1)
Lymphs Abs: 1256 cells/uL (ref 850–3900)
MCH: 30.1 pg (ref 27.0–33.0)
MCHC: 33.2 g/dL (ref 32.0–36.0)
MCV: 90.5 fL (ref 80.0–100.0)
MONOS PCT: 6.8 %
MPV: 12.3 fL (ref 7.5–12.5)
NEUTROS PCT: 74.1 %
Neutro Abs: 5854 cells/uL (ref 1500–7800)
PLATELETS: 197 10*3/uL (ref 140–400)
RBC: 4.32 10*6/uL (ref 4.20–5.80)
RDW: 11.3 % (ref 11.0–15.0)
Total Lymphocyte: 15.9 %
WBC: 7.9 10*3/uL (ref 3.8–10.8)
WBCMIX: 537 {cells}/uL (ref 200–950)

## 2017-09-11 LAB — COMPLETE METABOLIC PANEL WITH GFR
AG Ratio: 1.2 (calc) (ref 1.0–2.5)
ALBUMIN MSPROF: 3.6 g/dL (ref 3.6–5.1)
ALT: 22 U/L (ref 9–46)
AST: 19 U/L (ref 10–35)
Alkaline phosphatase (APISO): 163 U/L — ABNORMAL HIGH (ref 40–115)
BILIRUBIN TOTAL: 0.5 mg/dL (ref 0.2–1.2)
BUN / CREAT RATIO: 18 (calc) (ref 6–22)
BUN: 34 mg/dL — AB (ref 7–25)
CHLORIDE: 108 mmol/L (ref 98–110)
CO2: 25 mmol/L (ref 20–32)
CREATININE: 1.89 mg/dL — AB (ref 0.70–1.11)
Calcium: 9 mg/dL (ref 8.6–10.3)
GFR, EST AFRICAN AMERICAN: 36 mL/min/{1.73_m2} — AB (ref >=60)
GFR, Est Non African American: 31 mL/min/{1.73_m2} — ABNORMAL LOW (ref >=60)
GLUCOSE: 157 mg/dL — AB (ref 65–99)
Globulin: 3 g/dL (calc) (ref 1.9–3.7)
Potassium: 4.6 mmol/L (ref 3.5–5.3)
Sodium: 141 mmol/L (ref 135–146)
TOTAL PROTEIN: 6.6 g/dL (ref 6.1–8.1)

## 2017-09-11 LAB — LIPID PANEL
CHOL/HDL RATIO: 4.8 (calc) (ref <5.0)
Cholesterol: 143 mg/dL (ref <200)
HDL: 30 mg/dL — ABNORMAL LOW (ref >40)
LDL CHOLESTEROL (CALC): 92 mg/dL
Non-HDL Cholesterol (Calc): 113 mg/dL (calc) (ref <130)
TRIGLYCERIDES: 115 mg/dL (ref <150)

## 2017-09-11 LAB — VITAMIN D 25 HYDROXY (VIT D DEFICIENCY, FRACTURES): VIT D 25 HYDROXY: 21 ng/mL — AB (ref 30–100)

## 2017-09-12 ENCOUNTER — Telehealth: Payer: Self-pay | Admitting: Family Medicine

## 2017-09-12 DIAGNOSIS — N183 Chronic kidney disease, stage 3 unspecified: Secondary | ICD-10-CM

## 2017-09-12 MED ORDER — VITAMIN D (ERGOCALCIFEROL) 1.25 MG (50000 UNIT) PO CAPS
50000.0000 [IU] | ORAL_CAPSULE | ORAL | 0 refills | Status: DC
Start: 2017-09-12 — End: 2018-02-25

## 2017-09-12 NOTE — Telephone Encounter (Signed)
Called pt caregiver (tracy)--about lab results and advised to stop pt Rx. Glipizide XL due to blood sugar well controlled by Dr. Mannie Stabile with instructions

## 2017-09-12 NOTE — Telephone Encounter (Signed)
Please call Tracy/caregiver and advised that I would like to stop his glipizide XL due to the fact that his blood sugars are very well controlled.  In addition his kidney function has worsened a little bit.  I would like for him to see a kidney specialist.  I am not sure that there is anything that needs to be done at this time but he does need to be followed by nephrology and his kidneys monitored on a regular basis.  Please advised that his blood counts are actually improved.  His cholesterol was within normal limits.  His liver tests look good.  However, his vitamin D was low.  He needs to start vitamin D prescription which will be once a week.  He will need for the next 3 months.  I have sent in this to his pharmacy.  In addition I have done a referral for the kidney doctor and you should hear back regarding this.  Please make sure he keeps his scheduled follow-up visit.

## 2017-09-12 NOTE — Telephone Encounter (Signed)
Anna- see msg 

## 2017-09-13 ENCOUNTER — Telehealth: Payer: Self-pay | Admitting: Family Medicine

## 2017-09-13 ENCOUNTER — Other Ambulatory Visit: Payer: Self-pay

## 2017-09-13 NOTE — Telephone Encounter (Signed)
Patient needs someone to call and speak w/ [facility] about his recent medication changes, so they can be bubble wrapped and sent out tomorrow. Cb#: [phone] Olivia Mackie)  caregiver

## 2017-09-13 NOTE — Telephone Encounter (Signed)
Called and verified that they would be bubble packed and delivered tomorrow

## 2017-09-19 ENCOUNTER — Telehealth: Payer: Self-pay

## 2017-09-19 DIAGNOSIS — N184 Chronic kidney disease, stage 4 (severe): Principal | ICD-10-CM

## 2017-09-19 DIAGNOSIS — E1122 Type 2 diabetes mellitus with diabetic chronic kidney disease: Secondary | ICD-10-CM

## 2017-09-19 NOTE — Telephone Encounter (Signed)
Spoke with home health nurse. Stated patients glipizide recently discontinued. Monitoring cbg's at home. Monday bg was 189, Tuesday 190, today (Wednesday) 204. She also stated patient needs new meter. When checking bg, meter will read error. Have changed batteries and tried a few things. Advised I will sent Dr.Hagler the message and wait for her response. Home health nurse verbalized understanding.

## 2017-09-19 NOTE — Telephone Encounter (Signed)
Left message for home health nurse returning her call.

## 2017-09-19 NOTE — Telephone Encounter (Signed)
Received a message that the home health nurse was wanting to speak with someone regarding this patient. Returned call and left message requesting call back.Marland Kitchen

## 2017-09-20 MED ORDER — BLOOD GLUCOSE METER KIT: PACK | 0 refills | Status: DC

## 2017-09-20 NOTE — Telephone Encounter (Signed)
Order entered for new meter and supplies per Dr.Hagler and sent to pharmacy.

## 2017-09-20 NOTE — Telephone Encounter (Signed)
Please send in order for new meter and strips. Gwen Her. Mannie Stabile, MD

## 2017-09-25 ENCOUNTER — Telehealth: Payer: Self-pay | Admitting: Family Medicine

## 2017-09-25 NOTE — Telephone Encounter (Signed)
Patient has signs of UTI - bad odor and burning and hurting.  Can you call the home health nurse

## 2017-09-26 ENCOUNTER — Telehealth: Payer: Self-pay | Admitting: Family Medicine

## 2017-09-26 NOTE — Telephone Encounter (Signed)
Left message requesting  call back.

## 2017-09-26 NOTE — Telephone Encounter (Signed)
Per Dr.Hagler please call home health nurse tracey to find out more information about patients UTI possibility.

## 2017-09-26 NOTE — Telephone Encounter (Signed)
Leanne Lovely [phone]/ [phone]  Left voicemail regarding patient. States she called yesterday but I do not see any documentation. She did not leave voicemail as to why. I returned her call, no answer.  I left a voicemail with my direct extention.   Olivia Mackie [phone] Left voicemail requesting antibiotic for UTI. Nurse just left patients residence & requested for her to call you and let you know that he has symptoms of UTI.  I called her back for more information, again no answer. I left voicemail with my direct extension.

## 2017-09-26 NOTE — Telephone Encounter (Signed)
Please find out what symptoms he is having. Why do they think he has a UTI. If he is confused, fever, nausea, vomiting or other worrisome s/s he need to be taken to the ED. Please call his emergency contact and see if he is ok and what is going on, he lives alone but does have health nurse in the house most days.  Gwen Her. Mannie Stabile, MD

## 2017-09-27 NOTE — Telephone Encounter (Signed)
See previous notes.

## 2017-09-27 NOTE — Telephone Encounter (Signed)
Spoke with Ivin Booty (emergency contact) and she stated it must have been [name] that called. I advised her that if he was having any of the symptoms Dr.Hagler listed he needed to be evaluated. I told her we would just rather he be seen at an urgent care or the ED instead of waiting because he had no doctors in the office today and wouldn't re-open until Tuesday. I explained that an untreated UTI could be dangerous in the elderly and things could go down hill quickly. She verbalized understanding and stated she would call the nurse right then.

## 2017-10-02 ENCOUNTER — Telehealth: Payer: Self-pay | Admitting: Family Medicine

## 2017-10-02 NOTE — Telephone Encounter (Signed)
Called Sharron back with no answer. Left message requesting call back. Will try again tomorrow.

## 2017-10-02 NOTE — Telephone Encounter (Signed)
Please call sharron at [phone].  He needs to go back on his meds.  He is a real problem not taking his meds.  Please call asap.

## 2017-10-03 NOTE — Telephone Encounter (Signed)
We have left a message with the family and the nurse even last week telling them if patient was not his normal self that he needed to be seen and evaluated in our office or at the emergency department.  We had appointments last week and even earlier this week where he could have been seen.  The nurse had called last week stating that they believe that he had an infection in his urine but they never called back with any more information.  We told them if he was not his normal self that he needed to be taken to the emergency department.  His sugars could be running high because he has an infection.  He needs to be seen and evaluated.  We cannot give recommendations and advice over the phone without seeing and treating the patient and knowing what is going on.  What I do know that over a week ago someone called stating that he was not his normal self and that they thought he had a urinary tract infection.  Has he been seen by a physician?  He needs to be seen and evaluated and have some laboratory check done.  It is my recommendation that if he is not behaving normally or if there is concerns that he be seen by a doctor.  He can go to the emergency department or the urgent care clinic today.

## 2017-10-03 NOTE — Telephone Encounter (Signed)
Mike Briggs came into the office today to discuss Mike Briggs. She stated that he is "off" and not remembering. He will eat breakfast but then he wont remember to eat lunch or dinner. She says the patient says he doesn't know what is going on. The patient's blood sugars are running high after eating. All of this started after Dr.Hagler stopped his medications. I told her I would send Dr.Hagler a message letting her know and I would get back with her with an answer. She verbalized understanding.

## 2017-10-03 NOTE — Telephone Encounter (Signed)
Spoke with Ivin Booty and advised her that the patient needs to be seen at the urgent care or the ED to be evaluated and have labs drawn. She verbalized understanding. Ivin Booty was here with someone else. She asked if the lab work could just be called in and I explained that the patient needed to be seen and that [facility] couldn't just order labs without seeing that patient. She had no appointments available this today. This had been going on for over a week. I had spoken with someone last week and told them that the patient needed to be evaluated for UTI symptoms and was told that the patient would be taken to either an urgent care or the ED for evaluation.

## 2017-10-08 ENCOUNTER — Encounter: Payer: Self-pay | Admitting: Family Medicine

## 2017-10-08 ENCOUNTER — Ambulatory Visit: Payer: Medicare Other | Admitting: Family Medicine

## 2017-10-08 ENCOUNTER — Encounter (HOSPITAL_COMMUNITY): Payer: Self-pay

## 2017-10-08 ENCOUNTER — Emergency Department (HOSPITAL_COMMUNITY)
Admission: EM | Admit: 2017-10-08 | Discharge: 2017-10-08 | Disposition: A | Discharge status: Home / Self Care | Payer: Medicare Other | Attending: Emergency Medicine | Admitting: Emergency Medicine

## 2017-10-08 ENCOUNTER — Ambulatory Visit (INDEPENDENT_AMBULATORY_CARE_PROVIDER_SITE_OTHER): Payer: Medicare Other | Admitting: Family Medicine

## 2017-10-08 ENCOUNTER — Emergency Department (HOSPITAL_COMMUNITY): Payer: Medicare Other

## 2017-10-08 ENCOUNTER — Other Ambulatory Visit: Payer: Self-pay

## 2017-10-08 VITALS — BP 134/70 | HR 84 | Temp 98.0°F | Resp 12 | Ht 71.0 in | Wt 226.0 lb

## 2017-10-08 DIAGNOSIS — Z79899 Other long term (current) drug therapy: Secondary | ICD-10-CM | POA: Diagnosis not present

## 2017-10-08 DIAGNOSIS — E1122 Type 2 diabetes mellitus with diabetic chronic kidney disease: Secondary | ICD-10-CM | POA: Insufficient documentation

## 2017-10-08 DIAGNOSIS — R410 Disorientation, unspecified: Secondary | ICD-10-CM | POA: Diagnosis not present

## 2017-10-08 DIAGNOSIS — Z87891 Personal history of nicotine dependence: Secondary | ICD-10-CM | POA: Diagnosis not present

## 2017-10-08 DIAGNOSIS — N390 Urinary tract infection, site not specified: Secondary | ICD-10-CM | POA: Diagnosis not present

## 2017-10-08 DIAGNOSIS — I69351 Hemiplegia and hemiparesis following cerebral infarction affecting right dominant side: Secondary | ICD-10-CM | POA: Insufficient documentation

## 2017-10-08 DIAGNOSIS — R4182 Altered mental status, unspecified: Secondary | ICD-10-CM | POA: Diagnosis present

## 2017-10-08 DIAGNOSIS — R350 Frequency of micturition: Secondary | ICD-10-CM

## 2017-10-08 DIAGNOSIS — N183 Chronic kidney disease, stage 3 (moderate): Secondary | ICD-10-CM | POA: Insufficient documentation

## 2017-10-08 DIAGNOSIS — I129 Hypertensive chronic kidney disease with stage 1 through stage 4 chronic kidney disease, or unspecified chronic kidney disease: Secondary | ICD-10-CM | POA: Diagnosis not present

## 2017-10-08 DIAGNOSIS — R1011 Right upper quadrant pain: Secondary | ICD-10-CM

## 2017-10-08 LAB — COMPREHENSIVE METABOLIC PANEL
ALK PHOS: 157 U/L — AB (ref 38–126)
ALT: 21 U/L (ref 0–44)
AST: 21 U/L (ref 15–41)
Albumin: 3.4 g/dL — ABNORMAL LOW (ref 3.5–5.0)
Anion gap: 6 (ref 5–15)
BUN: 36 mg/dL — ABNORMAL HIGH (ref 8–23)
CALCIUM: 8.7 mg/dL — AB (ref 8.9–10.3)
CO2: 25 mmol/L (ref 22–32)
CREATININE: 1.74 mg/dL — AB (ref 0.61–1.24)
Chloride: 108 mmol/L (ref 98–111)
GFR calc Af Amer: 39 mL/min — ABNORMAL LOW (ref >60)
GFR calc non Af Amer: 34 mL/min — ABNORMAL LOW (ref >60)
GLUCOSE: 190 mg/dL — AB (ref 70–99)
Potassium: 4.2 mmol/L (ref 3.5–5.1)
SODIUM: 139 mmol/L (ref 135–145)
Total Bilirubin: 0.5 mg/dL (ref 0.3–1.2)
Total Protein: 6.8 g/dL (ref 6.5–8.1)

## 2017-10-08 LAB — URINALYSIS, ROUTINE W REFLEX MICROSCOPIC
BILIRUBIN URINE: NEGATIVE
Glucose, UA: NEGATIVE mg/dL
Ketones, ur: NEGATIVE mg/dL
Nitrite: NEGATIVE
PROTEIN: 100 mg/dL — AB
SPECIFIC GRAVITY, URINE: 1.014 (ref 1.005–1.030)
WBC, UA: >50 WBC/hpf — ABNORMAL HIGH (ref 0–5)
pH: 5 (ref 5.0–8.0)

## 2017-10-08 LAB — CBC WITH DIFFERENTIAL/PLATELET
Basophils Absolute: 0 10*3/uL (ref 0.0–0.1)
Basophils Relative: 0 %
EOS ABS: 0.2 10*3/uL (ref 0.0–0.7)
Eosinophils Relative: 2 %
HCT: 41.1 % (ref 39.0–52.0)
HEMOGLOBIN: 13.1 g/dL (ref 13.0–17.0)
LYMPHS ABS: 1.3 10*3/uL (ref 0.7–4.0)
Lymphocytes Relative: 15 %
MCH: 30 pg (ref 26.0–34.0)
MCHC: 31.9 g/dL (ref 30.0–36.0)
MCV: 94.3 fL (ref 78.0–100.0)
Monocytes Absolute: 0.6 10*3/uL (ref 0.1–1.0)
Monocytes Relative: 7 %
NEUTROS PCT: 76 %
Neutro Abs: 6.5 10*3/uL (ref 1.7–7.7)
Platelets: 166 10*3/uL (ref 150–400)
RBC: 4.36 MIL/uL (ref 4.22–5.81)
RDW: 12.1 % (ref 11.5–15.5)
WBC: 8.6 10*3/uL (ref 4.0–10.5)

## 2017-10-08 MED ORDER — CEPHALEXIN 500 MG PO CAPS: 500.0000 mg | ORAL_CAPSULE | Freq: Three times a day (TID) | ORAL | 0 refills | Status: AC

## 2017-10-08 MED ORDER — CEPHALEXIN 500 MG PO CAPS
500.0000 mg | ORAL_CAPSULE | Freq: Once | ORAL | Status: AC
  Administered 2017-10-08: 500 mg via ORAL
  Filled 2017-10-08: qty 1

## 2017-10-08 MED ORDER — SODIUM CHLORIDE 0.9 % IV SOLN: 1.0000 g | Freq: Once | INTRAVENOUS | Status: DC

## 2017-10-08 NOTE — Discharge Instructions (Signed)

## 2017-10-08 NOTE — ED Triage Notes (Signed)
Pt sent from [facility].  Per Dr. Mannie Stabile, pt's friend brought pt to office today and reports pt isn't quite acting like himself.  Reports doesn't appear confused but just not himself.  Also reports frequent urination.  Pt oriented to self and place.  Denies any pain.  PCP says pt doesn't have any family that can take him to get any outpt tests performed so sent pt to ER for further eval.

## 2017-10-08 NOTE — ED Provider Notes (Signed)
Emergency Department Provider Note   I have reviewed the triage vital signs and the nursing notes.   HISTORY  Chief Complaint Altered Mental Status   HPI Mike Briggs is a 84 y.o. male with PMH of DM, HLD, HTN, and prior CVA to the emergency department from his primary care physician's office.  The initial report is that the patient has increased confusion over the past several days and possibly had some right-sided abdominal discomfort.  The patient's caregiver, now at bedside, states that he is currently at his baseline mental status.  They initially made the PCP appointment 2 weeks ago when he had some confusion after continuing his home medications. Patient denies any pain but level 5 caveat applies with baseline confusion.   Level 5 caveat: confusion.    Past Medical History:  Diagnosis Date  . Arthritis   . Cataract   . Chronic kidney disease   . Depression   . Diabetes mellitus   . Hyperlipidemia   . Hypertension   . Stroke [facility])     Patient Active Problem List   Diagnosis Date Noted  . Vitamin D deficiency 05/31/2016  . Essential hypertension 05/22/2016  . Hemiparesis affecting right side as late effect of cerebrovascular accident (CVA) ([place]) 05/22/2016  . Personal history of gout 05/22/2016  . BPH (benign prostatic hyperplasia) 05/22/2016  . Chronic constipation 05/22/2016  . HLD (hyperlipidemia) 05/22/2016  . Pseudophakia 12/06/2015  . Vascular dementia 10/23/2012  . Chronic kidney disease, stage III (moderate) ([place]) 10/20/2012  . Type 2 diabetes mellitus with diabetic chronic kidney disease ([place]) 12/16/2008  . ANKLE, ARTHRITIS, DEGEN./OSTEO 12/16/2008    Past Surgical History:  Procedure Laterality Date  . BACK SURGERY     cervical and lumbar  . CATARACT EXTRACTION    . CHOLECYSTECTOMY    . FOOT SURGERY     5th toe   . SPINE SURGERY     lumbar and cervical fusions   Allergies Patient has no known allergies.  Family History  Family  history unknown: Yes    Social History Social History   Tobacco Use  . Smoking status: Former Smoker    Last attempt to quit: 08/07/1967    Years since quitting: 50.2  . Smokeless tobacco: Never Used  Substance Use Topics  . Alcohol use: No  . Drug use: No    Review of Systems  Level 5 caveat: Confusion.   ____________________________________________   PHYSICAL EXAM:  VITAL SIGNS: ED Triage Vitals  Enc Vitals Group     BP 10/08/17 1117 131/78     Pulse Rate 10/08/17 1117 79     Resp 10/08/17 1117 16     Temp 10/08/17 1117 97.8 F (36.6 C)     Temp Source 10/08/17 1117 Oral     SpO2 10/08/17 1117 99 %     Weight 10/08/17 1114 232 lb (105.2 kg)     Height 10/08/17 1114 5\' 11"  (1.803 m)     Pain Score 10/08/17 1114 0   Constitutional: Alert with some baseline confusion. Well appearing and in no acute distress. Eyes: Conjunctivae are normal. PERRL.  Head: Atraumatic. Nose: No congestion/rhinnorhea. Mouth/Throat: Mucous membranes are slightly dry.  Neck: No stridor.   Cardiovascular: Normal rate, regular rhythm. Good peripheral circulation. Grossly normal heart sounds.   Respiratory: Normal respiratory effort.  No retractions. Lungs CTAB. Gastrointestinal: Soft and nontender. No distention.  Musculoskeletal: No lower extremity tenderness nor edema. No gross deformities of extremities. Neurologic:  No  gross focal neurologic deficits are appreciated.  Skin:  Skin is warm, dry and intact. No rash noted.  ____________________________________________   LABS (all labs ordered are listed, but only abnormal results are displayed)  Labs Reviewed  COMPREHENSIVE METABOLIC PANEL - Abnormal; Notable for the following components:      Result Value   Glucose, Bld 190 (*)    BUN 36 (*)    Creatinine, Ser 1.74 (*)    Calcium 8.7 (*)    Albumin 3.4 (*)    Alkaline Phosphatase 157 (*)    GFR calc non Af Amer 34 (*)    GFR calc Af Amer 39 (*)    All other components within  normal limits  URINALYSIS, ROUTINE W REFLEX MICROSCOPIC - Abnormal; Notable for the following components:   APPearance CLOUDY (*)    Hgb urine dipstick MODERATE (*)    Protein, ur 100 (*)    Leukocytes, UA LARGE (*)    WBC, UA >50 (*)    Bacteria, UA MANY (*)    All other components within normal limits  URINE CULTURE  CBC WITH DIFFERENTIAL/PLATELET   ____________________________________________  EKG   EKG Interpretation  Date/Time:  Tuesday October 08 2017 11:22:55 EDT Ventricular Rate:  79 PR Interval:    QRS Duration: 90 QT Interval:  385 QTC Calculation: 442 R Axis:     Text Interpretation:  Sinus rhythm Prolonged PR interval Abnormal R-wave progression, early transition No STEMI.  Confirmed by Nanda Quinton [phone]) on 10/08/2017 11:28:42 AM       ____________________________________________  RADIOLOGY  Dg Abdomen Acute W/chest  Result Date: 10/08/2017 CLINICAL DATA:  Abdominal pain and dysuria EXAM: DG ABDOMEN ACUTE W/ 1V CHEST COMPARISON:  Chest radiograph October 20, 2012; CT abdomen and pelvis December 19, 2012 FINDINGS: PA chest: There is no edema or consolidation. The heart size and pulmonary vascularity are normal. No adenopathy. There is aortic atherosclerosis. There is degenerative change in each shoulder. Supine and upright abdomen: There is moderate stool in the colon. There is no bowel dilatation or air-fluid level to suggest bowel obstruction. No free air. There is postoperative change in the lower lumbar region. IMPRESSION: Moderate stool in colon. No bowel obstruction or free air. No lung edema or consolidation. There is aortic atherosclerosis. Aortic Atherosclerosis (ICD10-I70.0). Electronically Signed   By: Lowella Grip III M.D.   On: 10/08/2017 13:27    ____________________________________________   PROCEDURES  Procedure(s) performed:   Procedures  None ____________________________________________   INITIAL IMPRESSION / ASSESSMENT AND  PLAN / ED COURSE  Pertinent labs & imaging results that were available during my care of the patient were reviewed by me and considered in my medical decision making (see chart for details).  Patient presents to the emergency department with altered mental status earlier in the week that is improved over the past 5 days.  Patient seen at the PCPs office and initially appreciated some change in mental status.  The patient's fever is now at bedside states actually this is baseline for the patient and his been improving over the past 5 days.  He does have evidence of a urinary tract infection on UA.  His labs and EKG are normal.  Was some concern for possible abdominal distention so I obtained a 3 view abdomen which showed moderate constipation but no bowel obstruction.  Patient is not significantly tender on exam.  No indication for advanced imaging of the abdomen or neuroimaging given the patient is back to his mental status  baseline. Will cover with Keflex and discharge for PCP follow up as an outpatient.   At this time, I do not feel there is any life-threatening condition present. I have reviewed and discussed all results (EKG, imaging, lab, urine as appropriate), exam findings with patient. I have reviewed nursing notes and appropriate previous records.  I feel the patient is safe to be discharged home without further emergent workup. Discussed usual and customary return precautions. Patient and family (if present) verbalize understanding and are comfortable with this plan.  Patient will follow-up with their primary care provider. If they do not have a primary care provider, information for follow-up has been provided to them. All questions have been answered.  ____________________________________________  FINAL CLINICAL IMPRESSION(S) / ED DIAGNOSES  Final diagnoses:  Lower urinary tract infectious disease     MEDICATIONS GIVEN DURING THIS VISIT:  Medications  cephALEXin (KEFLEX) capsule 500  mg (500 mg Oral Given 10/08/17 1353)     NEW OUTPATIENT MEDICATIONS STARTED DURING THIS VISIT:  Discharge Medication List as of 10/08/2017  1:43 PM    START taking these medications   Details  cephALEXin (KEFLEX) 500 MG capsule Take 1 capsule (500 mg total) by mouth 3 (three) times daily for 7 days., Starting Tue 10/08/2017, Until Tue 10/15/2017, Normal        Note:  This document was prepared using Dragon voice recognition software and may include unintentional dictation errors.  Nanda Quinton, MD Emergency Medicine    Long, [facility], MD 10/08/17 [phone]

## 2017-10-08 NOTE — Progress Notes (Signed)
Patient ID: Mike Briggs, male    DOB: [birth year], 84 y.o.   MRN: [id number]  Chief Complaint  Patient presents with  . forgettful  . acting different    Allergies Patient has no known allergies.  Subjective:   ALCIDE Briggs is a 84 y.o. male who presents to [facility] today.  HPI Here for follow up. Presenting with caregiver Elyse Jarvis) today. Reports that over the past couple weeks that patient has not been his normal self.  Reports that he has had intermittent confusion and very forgetful.  Has not been able to answer questions like he normally does.  They deny any falls or head trauma. Patient reports that he has been burning with urination and his abdomen has been hurting for several days. Reports that his urine has had a bad odor and he has been urinating more frequently. Has had some constipation.  Appetite is been fairly good.  Reports he has been drinking well.  He reports that nothing is bothering him.  Caregiver reports that they thought initially that his changes in memory were due to the fact that his Namenda had been stopped at our last visit.  I did discuss with him that his Namenda was not stopped.  It was continued and his pill packs.  The only medicine that was discontinued was the Remeron 7.5 mg that he has been taking occasionally.  Caregivers report that he has been a little bit more confused lately and cannot seem to remember things.  Reports that his urine has been smelling badly.  Denies any fevers, nausea, vomiting, diarrhea.  He does report that his bowels are occasionally hard and his belly hurts on the right side. He reports he does not feel down, depressed, or hopeless.  The report that they were concerned because his blood sugar had been decreased.  Had checked some sugars and they were in the 150s to 180s.  However, they do report that the machine was messed up.  Discussed with friend that his blood sugar hemoglobin A1c at last check  was 6.1% and this is over controlled.  Explained risk of hypoglycemia.   Past Medical History:  Diagnosis Date  . Arthritis   . Cataract   . Chronic kidney disease   . Depression   . Diabetes mellitus   . Hyperlipidemia   . Hypertension   . Stroke [facility])     Past Surgical History:  Procedure Laterality Date  . BACK SURGERY     cervical and lumbar  . CATARACT EXTRACTION    . CHOLECYSTECTOMY    . FOOT SURGERY     5th toe   . SPINE SURGERY     lumbar and cervical fusions    Family History  Family history unknown: Yes     Social History   Socioeconomic History  . Marital status: Divorced    Spouse name: Not on file  . Number of children: 3  . Years of education: 10  . Highest education level: Not on file  Occupational History  . Occupation: Retired.  Social Needs  . Financial resource strain: Not on file  . Food insecurity:    Worry: Not on file    Inability: Not on file  . Transportation needs:    Medical: Not on file    Non-medical: Not on file  Tobacco Use  . Smoking status: Former Smoker    Last attempt to quit: 08/07/1967    Years since quitting: 50.2  .  Smokeless tobacco: Never Used  Substance and Sexual Activity  . Alcohol use: No  . Drug use: No  . Sexual activity: Not Currently  Lifestyle  . Physical activity:    Days per week: Not on file    Minutes per session: Not on file  . Stress: Not on file  Relationships  . Social connections:    Talks on phone: Not on file    Gets together: Not on file    Attends religious service: Not on file    Active member of club or organization: Not on file    Attends meetings of clubs or organizations: Not on file    Relationship status: Not on file  Other Topics Concern  . Not on file  Social History Narrative   Wheelchair bound.Has not walked in many years, at least five years.  Lives alone. Has 2 aides that come in to help him.   Lives alone.    Retired.   Eats all food groups.    Has 3 children but  does not have contact with them.   Divorced.   Current Outpatient Medications on File Prior to Visit  Medication Sig Dispense Refill  . aspirin EC 81 MG EC tablet Take 1 tablet (81 mg total) by mouth daily.    Marland Kitchen atorvastatin (LIPITOR) 10 MG tablet TAKE 1 TABLET BY MOUTH AFTER SUPPER. 90 tablet 0  . blood glucose meter kit and supplies Dispense based on patient and insurance preference. Use up to four times daily as directed. (FOR ICD-10 E10.9, E11.9). 1 each 0  . colchicine 0.6 MG tablet TAKE 1 TABLET BY MOUTH ONCE DAILY. 90 tablet 0  . lisinopril (PRINIVIL,ZESTRIL) 40 MG tablet TAKE (1) TABLET BY MOUTH ONCE DAILY. 90 tablet 0  . memantine (NAMENDA) 10 MG tablet     . metoprolol succinate (TOPROL-XL) 25 MG 24 hr tablet TAKE (1) TABLET BY MOUTH ONCE DAILY. 90 tablet 0  . Misc. Devices (FOAM CHAIR CUSHION) MISC Use in wheelchair/recliner 1 each 0  . tamsulosin (FLOMAX) 0.4 MG CAPS capsule TAKE 1 CAPSULE BY MOUTH TWICE DAILY. 180 capsule 0  . UNABLE TO FIND pullups 72/m, underpads 150/m Dx incontinence 100 each 3  . Vitamin D, Ergocalciferol, (DRISDOL) 50000 units CAPS capsule Take 1 capsule (50,000 Units total) by mouth every 7 (seven) days. 12 capsule 0   No current facility-administered medications on file prior to visit.     Review of Systems  Constitutional: Negative for activity change, appetite change, chills, diaphoresis, fatigue, fever and unexpected weight change.  HENT: Negative for sore throat, trouble swallowing and voice change.   Eyes: Negative for visual disturbance.  Respiratory: Negative for cough, chest tightness, shortness of breath and wheezing.   Cardiovascular: Negative for chest pain, palpitations and leg swelling.  Gastrointestinal: Positive for abdominal distention and abdominal pain. Negative for diarrhea, nausea, rectal pain and vomiting.  Endocrine: Positive for polyuria.  Genitourinary: Positive for dysuria, frequency and urgency. Negative for decreased urine  volume.  Musculoskeletal: Positive for back pain.  Skin: Negative for rash.  Neurological: Negative for dizziness, tremors, syncope, facial asymmetry, weakness, light-headedness and headaches.  Hematological: Negative for adenopathy. Does not bruise/bleed easily.  Psychiatric/Behavioral: Positive for confusion.     Objective:   BP 134/70 (BP Location: Right Arm, Patient Position: Sitting, Cuff Size: Large)   Pulse 84   Temp 98 F (36.7 C) (Temporal)   Resp 12   Ht '5\' 11"'$  (1.803 m)   Wt 226 lb 0.6  oz (102.5 kg)   SpO2 98% Comment: room air  BMI 31.53 kg/m   Physical Exam  Constitutional: He appears well-developed and well-nourished. No distress.  HENT:  Head: Normocephalic and atraumatic.  Eyes: Pupils are equal, round, and reactive to light. Conjunctivae and EOM are normal.  Neck: Normal range of motion. Neck supple.  Cardiovascular: Normal rate and regular rhythm.  Pulmonary/Chest: Effort normal and breath sounds normal.  Abdominal: Soft. Bowel sounds are normal. He exhibits distension. There is tenderness in the right upper quadrant.  Abdomen is distended on the right side.  Tender to palpation.  Patient winces with pain upon palpation of the right upper quadrant.  Bowel sounds are slightly diminished.  Neurological: He is alert.  Decreased strength in lower extremities bilaterally.  Patient is alert but not oriented to date or year.  Pleasant.  No acute distress.  Skin: He is not diaphoretic.  Psychiatric: Cognition and memory are impaired.   Depression screen [facility] 2/9 10/08/2017 09/10/2017 09/06/2017 05/14/2017 12/25/2016  Decreased Interest 2 0 0 0 0  Down, Depressed, Hopeless 0 0 0 0 0  PHQ - 2 Score 2 0 0 0 0  Altered sleeping 0 0 2 - -  Tired, decreased energy 0 2 2 - -  Change in appetite 0 0 0 - -  Feeling bad or failure about yourself  0 0 0 - -  Trouble concentrating 0 0 2 - -  Moving slowly or fidgety/restless 3 0 3 - -  Suicidal thoughts 0 0 0 - -  PHQ-9 Score  _0 - -  Difficult doing work/chores - Not difficult at all Somewhat difficult - -     Assessment and Plan  1. Urinary frequency 2. Confusion 3. Right upper quadrant abdominal pain  84 year old wheelchair-bound male who lives alone presenting with a friend today for visit.  Report with a concern that over the past couple weeks he has been having intermittent/increasing confusion.  Also with a reported history of urinary frequency, polyuria, and now right sided abdominal pain.  Patient has limited resources and lives alone.  He does not have anyone who can take him to get imaging study of abdomen/labs on an outpatient basis. Did discuss with family that I feel like he needs urinalysis, labs, and imaging of his abdomen secondary to his pain.  Patient and family wish for him to be transferred to the emergency department for evaluation.  I have called the emergency department to explain patient's arrival and situation. No follow-ups on file. Tod Persia, [place] 10/08/2017

## 2017-10-11 ENCOUNTER — Ambulatory Visit: Payer: Medicare Other | Admitting: Family Medicine

## 2017-10-11 LAB — URINE CULTURE: Special Requests: NORMAL

## 2017-10-12 ENCOUNTER — Telehealth: Payer: Self-pay

## 2017-10-12 NOTE — Telephone Encounter (Signed)
Post ED Visit - Positive Culture Follow-up  Culture report reviewed by antimicrobial stewardship pharmacist:  []  Elenor Quinones, Pharm.D. []  Heide Guile, Pharm.D., BCPS AQ-ID [x]  Parks Neptune, Pharm.D., BCPS []  Alycia Rossetti, Pharm.D., BCPS []  [place], Pharm.D., BCPS, AAHIVP []  Legrand Como, Pharm.D., BCPS, AAHIVP []  Salome Arnt, PharmD, BCPS []  Johnnette Gourd, PharmD, BCPS []  Hughes Better, PharmD, BCPS []  Leeroy Cha, PharmD  Positive urine culture Treated with Cephalexin, organism sensitive to the same and no further patient follow-up is required at this time.  Genia Del 10/12/2017, 9:49 AM

## 2017-10-16 ENCOUNTER — Other Ambulatory Visit: Payer: Self-pay | Admitting: Family Medicine

## 2017-10-30 DIAGNOSIS — Z9181 History of falling: Secondary | ICD-10-CM | POA: Diagnosis not present

## 2017-10-30 DIAGNOSIS — N184 Chronic kidney disease, stage 4 (severe): Secondary | ICD-10-CM | POA: Diagnosis not present

## 2017-10-30 DIAGNOSIS — E1136 Type 2 diabetes mellitus with diabetic cataract: Secondary | ICD-10-CM | POA: Diagnosis not present

## 2017-10-30 DIAGNOSIS — H9192 Unspecified hearing loss, left ear: Secondary | ICD-10-CM | POA: Diagnosis not present

## 2017-10-30 DIAGNOSIS — Z9049 Acquired absence of other specified parts of digestive tract: Secondary | ICD-10-CM | POA: Diagnosis not present

## 2017-10-30 DIAGNOSIS — Z9849 Cataract extraction status, unspecified eye: Secondary | ICD-10-CM | POA: Diagnosis not present

## 2017-10-30 DIAGNOSIS — Z981 Arthrodesis status: Secondary | ICD-10-CM | POA: Diagnosis not present

## 2017-10-30 DIAGNOSIS — M1990 Unspecified osteoarthritis, unspecified site: Secondary | ICD-10-CM | POA: Diagnosis not present

## 2017-10-30 DIAGNOSIS — F329 Major depressive disorder, single episode, unspecified: Secondary | ICD-10-CM | POA: Diagnosis not present

## 2017-10-30 DIAGNOSIS — Z8673 Personal history of transient ischemic attack (TIA), and cerebral infarction without residual deficits: Secondary | ICD-10-CM | POA: Diagnosis not present

## 2017-10-30 DIAGNOSIS — E785 Hyperlipidemia, unspecified: Secondary | ICD-10-CM | POA: Diagnosis not present

## 2017-10-30 DIAGNOSIS — Z87891 Personal history of nicotine dependence: Secondary | ICD-10-CM | POA: Diagnosis not present

## 2017-10-30 DIAGNOSIS — Z6832 Body mass index (BMI) 32.0-32.9, adult: Secondary | ICD-10-CM | POA: Diagnosis not present

## 2017-10-30 DIAGNOSIS — I129 Hypertensive chronic kidney disease with stage 1 through stage 4 chronic kidney disease, or unspecified chronic kidney disease: Secondary | ICD-10-CM | POA: Diagnosis not present

## 2017-10-30 DIAGNOSIS — G8929 Other chronic pain: Secondary | ICD-10-CM | POA: Diagnosis not present

## 2017-10-30 DIAGNOSIS — E1122 Type 2 diabetes mellitus with diabetic chronic kidney disease: Secondary | ICD-10-CM | POA: Diagnosis not present

## 2017-10-30 DIAGNOSIS — M5441 Lumbago with sciatica, right side: Secondary | ICD-10-CM | POA: Diagnosis not present

## 2017-10-30 DIAGNOSIS — Z7984 Long term (current) use of oral hypoglycemic drugs: Secondary | ICD-10-CM | POA: Diagnosis not present

## 2017-10-31 DIAGNOSIS — M5441 Lumbago with sciatica, right side: Secondary | ICD-10-CM | POA: Diagnosis not present

## 2017-10-31 DIAGNOSIS — M1990 Unspecified osteoarthritis, unspecified site: Secondary | ICD-10-CM | POA: Diagnosis not present

## 2017-10-31 DIAGNOSIS — E1122 Type 2 diabetes mellitus with diabetic chronic kidney disease: Secondary | ICD-10-CM | POA: Diagnosis not present

## 2017-10-31 DIAGNOSIS — E1136 Type 2 diabetes mellitus with diabetic cataract: Secondary | ICD-10-CM | POA: Diagnosis not present

## 2017-10-31 DIAGNOSIS — I129 Hypertensive chronic kidney disease with stage 1 through stage 4 chronic kidney disease, or unspecified chronic kidney disease: Secondary | ICD-10-CM | POA: Diagnosis not present

## 2017-10-31 DIAGNOSIS — G8929 Other chronic pain: Secondary | ICD-10-CM | POA: Diagnosis not present

## 2017-11-04 DIAGNOSIS — I129 Hypertensive chronic kidney disease with stage 1 through stage 4 chronic kidney disease, or unspecified chronic kidney disease: Secondary | ICD-10-CM | POA: Diagnosis not present

## 2017-11-04 DIAGNOSIS — E1136 Type 2 diabetes mellitus with diabetic cataract: Secondary | ICD-10-CM | POA: Diagnosis not present

## 2017-11-04 DIAGNOSIS — G8929 Other chronic pain: Secondary | ICD-10-CM | POA: Diagnosis not present

## 2017-11-04 DIAGNOSIS — M1990 Unspecified osteoarthritis, unspecified site: Secondary | ICD-10-CM | POA: Diagnosis not present

## 2017-11-04 DIAGNOSIS — M5441 Lumbago with sciatica, right side: Secondary | ICD-10-CM | POA: Diagnosis not present

## 2017-11-04 DIAGNOSIS — E1122 Type 2 diabetes mellitus with diabetic chronic kidney disease: Secondary | ICD-10-CM | POA: Diagnosis not present

## 2017-11-06 DIAGNOSIS — G8929 Other chronic pain: Secondary | ICD-10-CM | POA: Diagnosis not present

## 2017-11-06 DIAGNOSIS — M5441 Lumbago with sciatica, right side: Secondary | ICD-10-CM | POA: Diagnosis not present

## 2017-11-06 DIAGNOSIS — E1122 Type 2 diabetes mellitus with diabetic chronic kidney disease: Secondary | ICD-10-CM | POA: Diagnosis not present

## 2017-11-06 DIAGNOSIS — M1990 Unspecified osteoarthritis, unspecified site: Secondary | ICD-10-CM | POA: Diagnosis not present

## 2017-11-06 DIAGNOSIS — I129 Hypertensive chronic kidney disease with stage 1 through stage 4 chronic kidney disease, or unspecified chronic kidney disease: Secondary | ICD-10-CM | POA: Diagnosis not present

## 2017-11-06 DIAGNOSIS — E1136 Type 2 diabetes mellitus with diabetic cataract: Secondary | ICD-10-CM | POA: Diagnosis not present

## 2017-11-14 ENCOUNTER — Other Ambulatory Visit: Payer: Self-pay | Admitting: Family Medicine

## 2017-11-20 ENCOUNTER — Other Ambulatory Visit: Payer: Self-pay | Admitting: Family Medicine

## 2017-12-14 ENCOUNTER — Emergency Department (HOSPITAL_COMMUNITY)
Admission: EM | Admit: 2017-12-14 | Discharge: 2017-12-14 | Disposition: A | Discharge status: Home / Self Care | Payer: Medicare Other | Attending: Emergency Medicine | Admitting: Emergency Medicine

## 2017-12-14 ENCOUNTER — Other Ambulatory Visit: Payer: Self-pay

## 2017-12-14 ENCOUNTER — Encounter (HOSPITAL_COMMUNITY): Payer: Self-pay | Admitting: Emergency Medicine

## 2017-12-14 ENCOUNTER — Emergency Department (HOSPITAL_COMMUNITY): Payer: Medicare Other

## 2017-12-14 DIAGNOSIS — Z79899 Other long term (current) drug therapy: Secondary | ICD-10-CM | POA: Insufficient documentation

## 2017-12-14 DIAGNOSIS — Z87891 Personal history of nicotine dependence: Secondary | ICD-10-CM | POA: Diagnosis not present

## 2017-12-14 DIAGNOSIS — E1122 Type 2 diabetes mellitus with diabetic chronic kidney disease: Secondary | ICD-10-CM | POA: Insufficient documentation

## 2017-12-14 DIAGNOSIS — N183 Chronic kidney disease, stage 3 (moderate): Secondary | ICD-10-CM | POA: Diagnosis not present

## 2017-12-14 DIAGNOSIS — R000 Tachycardia, unspecified: Secondary | ICD-10-CM | POA: Diagnosis not present

## 2017-12-14 DIAGNOSIS — Y939 Activity, unspecified: Secondary | ICD-10-CM | POA: Diagnosis not present

## 2017-12-14 DIAGNOSIS — W19XXXA Unspecified fall, initial encounter: Secondary | ICD-10-CM | POA: Insufficient documentation

## 2017-12-14 DIAGNOSIS — Y999 Unspecified external cause status: Secondary | ICD-10-CM | POA: Diagnosis not present

## 2017-12-14 DIAGNOSIS — Y929 Unspecified place or not applicable: Secondary | ICD-10-CM | POA: Insufficient documentation

## 2017-12-14 DIAGNOSIS — N3001 Acute cystitis with hematuria: Secondary | ICD-10-CM | POA: Diagnosis not present

## 2017-12-14 DIAGNOSIS — F0390 Unspecified dementia without behavioral disturbance: Secondary | ICD-10-CM | POA: Insufficient documentation

## 2017-12-14 DIAGNOSIS — S0990XA Unspecified injury of head, initial encounter: Secondary | ICD-10-CM | POA: Diagnosis not present

## 2017-12-14 DIAGNOSIS — I129 Hypertensive chronic kidney disease with stage 1 through stage 4 chronic kidney disease, or unspecified chronic kidney disease: Secondary | ICD-10-CM | POA: Insufficient documentation

## 2017-12-14 DIAGNOSIS — R531 Weakness: Secondary | ICD-10-CM | POA: Diagnosis not present

## 2017-12-14 DIAGNOSIS — R51 Headache: Secondary | ICD-10-CM | POA: Diagnosis not present

## 2017-12-14 DIAGNOSIS — I10 Essential (primary) hypertension: Secondary | ICD-10-CM | POA: Diagnosis not present

## 2017-12-14 LAB — CBC WITH DIFFERENTIAL/PLATELET
ABS IMMATURE GRANULOCYTES: 0.03 10*3/uL (ref 0.00–0.07)
BASOS PCT: 0 %
Basophils Absolute: 0 10*3/uL (ref 0.0–0.1)
Eosinophils Absolute: 0 10*3/uL (ref 0.0–0.5)
Eosinophils Relative: 0 %
HCT: 42.2 % (ref 39.0–52.0)
Hemoglobin: 13.2 g/dL (ref 13.0–17.0)
IMMATURE GRANULOCYTES: 0 %
LYMPHS ABS: 0.9 10*3/uL (ref 0.7–4.0)
LYMPHS PCT: 8 %
MCH: 29.5 pg (ref 26.0–34.0)
MCHC: 31.3 g/dL (ref 30.0–36.0)
MCV: 94.4 fL (ref 80.0–100.0)
MONOS PCT: 6 %
Monocytes Absolute: 0.7 10*3/uL (ref 0.1–1.0)
NEUTROS ABS: 8.9 10*3/uL — AB (ref 1.7–7.7)
NEUTROS PCT: 86 %
PLATELETS: 224 10*3/uL (ref 150–400)
RBC: 4.47 MIL/uL (ref 4.22–5.81)
RDW: 12.3 % (ref 11.5–15.5)
WBC: 10.5 10*3/uL (ref 4.0–10.5)
nRBC: 0 % (ref 0.0–0.2)

## 2017-12-14 LAB — COMPREHENSIVE METABOLIC PANEL
ALT: 18 U/L (ref 0–44)
AST: 21 U/L (ref 15–41)
Albumin: 3.4 g/dL — ABNORMAL LOW (ref 3.5–5.0)
Alkaline Phosphatase: 169 U/L — ABNORMAL HIGH (ref 38–126)
Anion gap: 9 (ref 5–15)
BUN: 33 mg/dL — AB (ref 8–23)
CHLORIDE: 103 mmol/L (ref 98–111)
CO2: 24 mmol/L (ref 22–32)
CREATININE: 1.55 mg/dL — AB (ref 0.61–1.24)
Calcium: 8.8 mg/dL — ABNORMAL LOW (ref 8.9–10.3)
GFR calc Af Amer: 45 mL/min — ABNORMAL LOW (ref >60)
GFR, EST NON AFRICAN AMERICAN: 39 mL/min — AB (ref >60)
Glucose, Bld: 271 mg/dL — ABNORMAL HIGH (ref 70–99)
Potassium: 5 mmol/L (ref 3.5–5.1)
Sodium: 136 mmol/L (ref 135–145)
TOTAL PROTEIN: 7.2 g/dL (ref 6.5–8.1)
Total Bilirubin: 0.9 mg/dL (ref 0.3–1.2)

## 2017-12-14 LAB — URINALYSIS, ROUTINE W REFLEX MICROSCOPIC
Bilirubin Urine: NEGATIVE
Glucose, UA: >=500 mg/dL — AB
Ketones, ur: NEGATIVE mg/dL
Nitrite: NEGATIVE
PH: 6 (ref 5.0–8.0)
Protein, ur: 100 mg/dL — AB
SPECIFIC GRAVITY, URINE: 1.01 (ref 1.005–1.030)
WBC, UA: >50 WBC/hpf — ABNORMAL HIGH (ref 0–5)

## 2017-12-14 LAB — LIPASE, BLOOD: Lipase: 32 U/L (ref 11–51)

## 2017-12-14 LAB — CK: Total CK: 239 U/L (ref 49–397)

## 2017-12-14 LAB — CBG MONITORING, ED: GLUCOSE-CAPILLARY: 236 mg/dL — AB (ref 70–99)

## 2017-12-14 LAB — TROPONIN I

## 2017-12-14 MED ORDER — CEPHALEXIN 500 MG PO CAPS: 500.0000 mg | ORAL_CAPSULE | Freq: Two times a day (BID) | ORAL | 0 refills | Status: AC

## 2017-12-14 MED ORDER — CEPHALEXIN 500 MG PO CAPS
500.0000 mg | ORAL_CAPSULE | Freq: Once | ORAL | Status: AC
  Administered 2017-12-14: 500 mg via ORAL
  Filled 2017-12-14: qty 1

## 2017-12-14 NOTE — ED Provider Notes (Signed)
Face-to-face evaluation   History: Patient with fall, as he was attempting to get out of his wheelchair.  This is happened previously.  He has difficulty managing his electric wheelchair according to caregivers were with him.  The patient is alert and comfortable.  He denies pain.  Physical exam: Alert elderly man who is calm.  Heart regular rate and rhythm without murmur.  Lungs clear anteriorly.  Abdomen soft and nontender.  Ribs are nontender without crepitation.  He has fair range of motion of the arms and legs bilaterally.  MDM-fall without serious injury, clinically.  Screening evaluation begun to look for occult injury and/or illnesses.  Medical screening examination/treatment/procedure(s) were conducted as a shared visit with non-physician practitioner(s) and myself.  I personally evaluated the patient during the encounter   Daleen Bo, MD 12/15/17 1143

## 2017-12-14 NOTE — Discharge Instructions (Signed)
Take your entire course of the antibiotics for your urinary infection.  Make sure you are drinking plenty of fluids.  Get rechecked for any fevers, vomiting or increased weakness. As discussed,  we have ordered an in home social worker consult to help assess other ways to keep you safer in your home.  Someone should be calling you to arrange this early this week.

## 2017-12-14 NOTE — ED Provider Notes (Signed)
[facility] EMERGENCY DEPARTMENT Provider Note   CSN: [id number] Arrival date & time: 12/14/17  0930     History   Chief Complaint Chief Complaint  Patient presents with  . Fall    HPI Mike Briggs is a 84 y.o. male with a history of diabetes, hypertension, stroke with residual right-sided weakness who is wheelchair-bound but is able to transfer from wheelchair presenting with a fall which occurred sometime last night.  He reports he does not recall when the fall occurred, but laid on the floor all night.  He tried to get himself up, but by this morning was becoming tired of trying, so he states he then pressed his life alert button and EMS arrived and transported here.  He denies any complaints of pain, but he did have an episode of emesis which he also states he does not recall occurring.  He denies any nausea or vomiting, also denies headache, dizziness, increased confusion,  chest pain or shortness of breath, abdominal pain or myalgias at this time.  Caregivers are now present and states that the patient frequently forgets to turn off his electric wheelchair and when he attempts to transfer himself it pushes him over.  He has 3 caregivers, 2 currently present, they last saw him at 8 PM last night.  The history is provided by the patient and a caregiver.    Past Medical History:  Diagnosis Date  . Arthritis   . Cataract   . Chronic kidney disease   . Depression   . Diabetes mellitus   . Hyperlipidemia   . Hypertension   . Stroke [facility])     Patient Active Problem List   Diagnosis Date Noted  . Vitamin D deficiency 05/31/2016  . Essential hypertension 05/22/2016  . Hemiparesis affecting right side as late effect of cerebrovascular accident (CVA) ([place]) 05/22/2016  . Personal history of gout 05/22/2016  . BPH (benign prostatic hyperplasia) 05/22/2016  . Chronic constipation 05/22/2016  . HLD (hyperlipidemia) 05/22/2016  . Pseudophakia 12/06/2015  . Vascular dementia  ([place]) 10/23/2012  . Chronic kidney disease, stage III (moderate) ([place]) 10/20/2012  . Type 2 diabetes mellitus with diabetic chronic kidney disease ([place]) 12/16/2008  . ANKLE, ARTHRITIS, DEGEN./OSTEO 12/16/2008    Past Surgical History:  Procedure Laterality Date  . BACK SURGERY     cervical and lumbar  . CATARACT EXTRACTION    . CHOLECYSTECTOMY    . FOOT SURGERY     5th toe   . SPINE SURGERY     lumbar and cervical fusions        Home Medications    Prior to Admission medications   Medication Sig Start Date End Date Taking? Authorizing Provider  atorvastatin (LIPITOR) 10 MG tablet TAKE 1 TABLET BY MOUTH AFTER SUPPER. 08/07/17  Yes Hagler, Apolonio Schneiders, MD  colchicine 0.6 MG tablet TAKE 1 TABLET BY MOUTH ONCE DAILY. 05/20/17  Yes Raylene Everts, MD  lisinopril (PRINIVIL,ZESTRIL) 40 MG tablet TAKE (1) TABLET BY MOUTH ONCE DAILY. 05/20/17  Yes Raylene Everts, MD  memantine (NAMENDA) 10 MG tablet Take 10 mg by mouth daily.  04/18/17  Yes [provider]  metoprolol succinate (TOPROL-XL) 25 MG 24 hr tablet TAKE (1) TABLET BY MOUTH ONCE DAILY. 05/20/17  Yes Raylene Everts, MD  tamsulosin (FLOMAX) 0.4 MG CAPS capsule TAKE 1 CAPSULE BY MOUTH TWICE DAILY. 10/16/17  Yes Hagler, Apolonio Schneiders, MD  Vitamin D, Ergocalciferol, (DRISDOL) 50000 units CAPS capsule Take 1 capsule (50,000 Units total) by  mouth every 7 (seven) days. 09/12/17  Yes Hagler, Apolonio Schneiders, MD  cephALEXin (KEFLEX) 500 MG capsule Take 1 capsule (500 mg total) by mouth 2 (two) times daily for 7 days. 12/14/17 12/21/17  Evalee Jefferson, PA-C    Family History Family History  Family history unknown: Yes    Social History Social History   Tobacco Use  . Smoking status: Former Smoker    Last attempt to quit: 08/07/1967    Years since quitting: 50.3  . Smokeless tobacco: Never Used  Substance Use Topics  . Alcohol use: No  . Drug use: No     Allergies   Patient has no known allergies.   Review of Systems Review of  Systems  Constitutional: Negative for fever.  HENT: Negative for congestion.   Eyes: Negative.   Respiratory: Negative for chest tightness and shortness of breath.   Cardiovascular: Negative for chest pain.  Gastrointestinal: Positive for vomiting. Negative for abdominal pain and nausea.  Genitourinary: Negative.   Musculoskeletal: Negative for arthralgias, joint swelling and neck pain.  Skin: Negative.  Negative for rash and wound.  Neurological: Negative for dizziness, weakness, light-headedness, numbness and headaches.  Psychiatric/Behavioral: Negative.      Physical Exam Updated Vital Signs BP (!) 143/94 (BP Location: Left Arm)   Pulse (!) 104   Temp (!) 97.4 F (36.3 C) (Oral)   Resp 18   Ht 5\' 11"  (1.803 m)   Wt 111.1 kg   SpO2 97%   BMI 34.17 kg/m   Physical Exam  Constitutional: He appears well-developed and well-nourished.  HENT:  Head: Normocephalic and atraumatic.  Eyes: Conjunctivae are normal.  Neck: Normal range of motion.  Cardiovascular: Normal rate, regular rhythm, normal heart sounds and intact distal pulses.  Pulmonary/Chest: Effort normal and breath sounds normal. He has no wheezes.  Abdominal: Soft. Bowel sounds are normal. He exhibits no mass. There is no tenderness. There is no guarding.  Musculoskeletal: Normal range of motion.  Bilateral ankle edema.  Poor foot/nail care.   Neurological: He is alert.  Moved all 4 extremities without deficit.  Skin: Skin is warm and dry.  Psychiatric: He has a normal mood and affect.  Nursing note and vitals reviewed.    ED Treatments / Results  Labs (all labs ordered are listed, but only abnormal results are displayed) Labs Reviewed  CBC WITH DIFFERENTIAL/PLATELET - Abnormal; Notable for the following components:      Result Value   Neutro Abs 8.9 (*)    All other components within normal limits  COMPREHENSIVE METABOLIC PANEL - Abnormal; Notable for the following components:   Glucose, Bld 271 (*)     BUN 33 (*)    Creatinine, Ser 1.55 (*)    Calcium 8.8 (*)    Albumin 3.4 (*)    Alkaline Phosphatase 169 (*)    GFR calc non Af Amer 39 (*)    GFR calc Af Amer 45 (*)    All other components within normal limits  URINALYSIS, ROUTINE W REFLEX MICROSCOPIC - Abnormal; Notable for the following components:   APPearance CLOUDY (*)    Glucose, UA >=500 (*)    Hgb urine dipstick MODERATE (*)    Protein, ur 100 (*)    Leukocytes, UA LARGE (*)    WBC, UA >50 (*)    Bacteria, UA FEW (*)    All other components within normal limits  CBG MONITORING, ED - Abnormal; Notable for the following components:   Glucose-Capillary 236 (*)  All other components within normal limits  URINE CULTURE  LIPASE, BLOOD  TROPONIN I  CK    EKG EKG Interpretation  Date/Time:  Saturday December 14 2017 09:41:22 EST Ventricular Rate:  106 PR Interval:    QRS Duration: 93 QT Interval:  365 QTC Calculation: 487 R Axis:   27 Text Interpretation:  Sinus tachycardia Prolonged PR interval Abnormal R-wave progression, early transition Borderline prolonged QT interval Baseline wander in lead(s) I aVR aVL Since last tracing QT has lengthened Confirmed by Daleen Bo [phone]) on 12/14/2017 10:56:28 AM   Radiology Ct Head Wo Contrast  Result Date: 12/14/2017 CLINICAL DATA:  Generalized pain since falling last night. History of stroke, hypertension and diabetes. EXAM: CT HEAD WITHOUT CONTRAST TECHNIQUE: Contiguous axial images were obtained from the base of the skull through the vertex without intravenous contrast. COMPARISON:  CT head 03/21/2014.  MRI brain 10/22/2012. FINDINGS: Brain: There is no evidence of acute intracranial hemorrhage, mass lesion, brain edema or extra-axial fluid collection. There is generalized atrophy with prominence of the ventricles and subarachnoid spaces. Moderate periventricular white matter disease is similar to previous studies, likely due to chronic small vessel ischemic changes.  There is no CT evidence of acute cortical infarction. Vascular: Intracranial vascular calcifications. No hyperdense vessel identified. Skull: Negative for fracture or focal lesion. Sinuses/Orbits: Postsurgical changes in the maxillary sinuses. Minimal left ethmoid sinus opacification. The visualized paranasal sinuses are otherwise clear without air-fluid levels. The mastoid air cells and middle ears are clear. No significant orbital findings. Other: None. IMPRESSION: 1. No acute intracranial findings. 2. Atrophy and chronic small vessel ischemic changes, similar to previous studies. Electronically Signed   By: Richardean Sale M.D.   On: 12/14/2017 10:59    Procedures Procedures (including critical care time)  Medications Ordered in ED Medications  cephALEXin (KEFLEX) capsule 500 mg (500 mg Oral Given 12/14/17 1223)     Initial Impression / Assessment and Plan / ED Course  I have reviewed the triage vital signs and the nursing notes.  Pertinent labs & imaging results that were available during my care of the patient were reviewed by me and considered in my medical decision making (see chart for details).     Labs and imaging reviewed and discussed with patient.  There are some abnormalities including chronic renal insufficiency and hyperglycemia but these findings are stable from prior evaluations.  He does have a UTI today, although he denies any urinary complaints.  He has no evidence of rhabdomyolysis, no new CVA, EKG and troponin stable.  He was on an antibiotic over a month ago for UTI.  Urine culture was reviewed and was sent to Keflex.  This was prescribed.  Caregivers at bedside are aware of this plan.  He is currently between MDs as Dr. Renato Battles is recently left the area.  Caregiver plans to contact Dr. Juel Burrow office on Monday to hopefully establish care.  In the interim patient was advised to follow-up here for recheck for any new or worsening symptoms.  We have also arranged a social work  consult for this patient to help give suggestions for ways patient can be safer in his home.  Patient was seen by Dr. Eulis Foster during this ED visit.  Final Clinical Impressions(s) / ED Diagnoses   Final diagnoses:  Acute cystitis with hematuria    ED Discharge Orders         Ordered    cephALEXin (KEFLEX) 500 MG capsule  2 times daily  12/14/17 [place]     12/14/17 1203    Face-to-face encounter (required for Medicare/Medicaid patients)    Comments:  I Evalee Jefferson certify that this patient is under my care and that I, or a nurse practitioner or physician's assistant working with me, had a face-to-face encounter that meets the physician face-to-face encounter requirements with this patient on 12/14/2017. The encounter with the patient was in whole, or in part for the following medical condition(s) which is the primary reason for home health care (List medical condition): Pt with cva and chronic right sided weakness.  He has an IT trainer wheelchair but sometimes has falls with transferring.  Pt also has dementia and caregivers state he is forgetful and does not turn the wheelchair off, which they feel sometimes causes fall.  Caregivers close by but not present 24/7.  He has a Air traffic controller.   12/14/17 1203           Evalee Jefferson, PA-C 12/14/17 1537    Daleen Bo, MD 12/15/17 1143

## 2017-12-14 NOTE — ED Notes (Signed)
Pt cleaned and brief changed

## 2017-12-14 NOTE — ED Triage Notes (Signed)
Patient brought in by EMS states he fell last night "sometime." Patient states he does not remember falling or vomiting. Patient had dried emesis on his shirt and floor at home. States he does not know why he fell and is complaining of pain "all over."

## 2017-12-16 LAB — URINE CULTURE

## 2017-12-25 DIAGNOSIS — G8929 Other chronic pain: Secondary | ICD-10-CM | POA: Diagnosis not present

## 2017-12-25 DIAGNOSIS — M5441 Lumbago with sciatica, right side: Secondary | ICD-10-CM | POA: Diagnosis not present

## 2017-12-25 DIAGNOSIS — E1122 Type 2 diabetes mellitus with diabetic chronic kidney disease: Secondary | ICD-10-CM | POA: Diagnosis not present

## 2017-12-25 DIAGNOSIS — I129 Hypertensive chronic kidney disease with stage 1 through stage 4 chronic kidney disease, or unspecified chronic kidney disease: Secondary | ICD-10-CM | POA: Diagnosis not present

## 2017-12-25 DIAGNOSIS — M1990 Unspecified osteoarthritis, unspecified site: Secondary | ICD-10-CM | POA: Diagnosis not present

## 2017-12-25 DIAGNOSIS — E1136 Type 2 diabetes mellitus with diabetic cataract: Secondary | ICD-10-CM | POA: Diagnosis not present

## 2017-12-29 DIAGNOSIS — E785 Hyperlipidemia, unspecified: Secondary | ICD-10-CM | POA: Diagnosis not present

## 2017-12-29 DIAGNOSIS — G8929 Other chronic pain: Secondary | ICD-10-CM | POA: Diagnosis not present

## 2017-12-29 DIAGNOSIS — M6281 Muscle weakness (generalized): Secondary | ICD-10-CM | POA: Diagnosis not present

## 2017-12-29 DIAGNOSIS — M1990 Unspecified osteoarthritis, unspecified site: Secondary | ICD-10-CM | POA: Diagnosis not present

## 2017-12-29 DIAGNOSIS — Z7984 Long term (current) use of oral hypoglycemic drugs: Secondary | ICD-10-CM | POA: Diagnosis not present

## 2017-12-29 DIAGNOSIS — Z8673 Personal history of transient ischemic attack (TIA), and cerebral infarction without residual deficits: Secondary | ICD-10-CM | POA: Diagnosis not present

## 2017-12-29 DIAGNOSIS — Z87891 Personal history of nicotine dependence: Secondary | ICD-10-CM | POA: Diagnosis not present

## 2017-12-29 DIAGNOSIS — E1122 Type 2 diabetes mellitus with diabetic chronic kidney disease: Secondary | ICD-10-CM | POA: Diagnosis not present

## 2017-12-29 DIAGNOSIS — N184 Chronic kidney disease, stage 4 (severe): Secondary | ICD-10-CM | POA: Diagnosis not present

## 2017-12-29 DIAGNOSIS — I129 Hypertensive chronic kidney disease with stage 1 through stage 4 chronic kidney disease, or unspecified chronic kidney disease: Secondary | ICD-10-CM | POA: Diagnosis not present

## 2017-12-29 DIAGNOSIS — Z9181 History of falling: Secondary | ICD-10-CM | POA: Diagnosis not present

## 2017-12-29 DIAGNOSIS — E1136 Type 2 diabetes mellitus with diabetic cataract: Secondary | ICD-10-CM | POA: Diagnosis not present

## 2017-12-29 DIAGNOSIS — M5441 Lumbago with sciatica, right side: Secondary | ICD-10-CM | POA: Diagnosis not present

## 2017-12-29 DIAGNOSIS — F329 Major depressive disorder, single episode, unspecified: Secondary | ICD-10-CM | POA: Diagnosis not present

## 2017-12-30 DIAGNOSIS — I129 Hypertensive chronic kidney disease with stage 1 through stage 4 chronic kidney disease, or unspecified chronic kidney disease: Secondary | ICD-10-CM | POA: Diagnosis not present

## 2017-12-30 DIAGNOSIS — M5441 Lumbago with sciatica, right side: Secondary | ICD-10-CM | POA: Diagnosis not present

## 2017-12-30 DIAGNOSIS — E1122 Type 2 diabetes mellitus with diabetic chronic kidney disease: Secondary | ICD-10-CM | POA: Diagnosis not present

## 2017-12-30 DIAGNOSIS — M1990 Unspecified osteoarthritis, unspecified site: Secondary | ICD-10-CM | POA: Diagnosis not present

## 2017-12-30 DIAGNOSIS — M6281 Muscle weakness (generalized): Secondary | ICD-10-CM | POA: Diagnosis not present

## 2017-12-30 DIAGNOSIS — G8929 Other chronic pain: Secondary | ICD-10-CM | POA: Diagnosis not present

## 2018-01-02 DIAGNOSIS — M1990 Unspecified osteoarthritis, unspecified site: Secondary | ICD-10-CM | POA: Diagnosis not present

## 2018-01-02 DIAGNOSIS — M6281 Muscle weakness (generalized): Secondary | ICD-10-CM | POA: Diagnosis not present

## 2018-01-02 DIAGNOSIS — I129 Hypertensive chronic kidney disease with stage 1 through stage 4 chronic kidney disease, or unspecified chronic kidney disease: Secondary | ICD-10-CM | POA: Diagnosis not present

## 2018-01-02 DIAGNOSIS — G8929 Other chronic pain: Secondary | ICD-10-CM | POA: Diagnosis not present

## 2018-01-02 DIAGNOSIS — M5441 Lumbago with sciatica, right side: Secondary | ICD-10-CM | POA: Diagnosis not present

## 2018-01-02 DIAGNOSIS — E1122 Type 2 diabetes mellitus with diabetic chronic kidney disease: Secondary | ICD-10-CM | POA: Diagnosis not present

## 2018-01-06 DIAGNOSIS — M5441 Lumbago with sciatica, right side: Secondary | ICD-10-CM | POA: Diagnosis not present

## 2018-01-06 DIAGNOSIS — M6281 Muscle weakness (generalized): Secondary | ICD-10-CM | POA: Diagnosis not present

## 2018-01-06 DIAGNOSIS — G8929 Other chronic pain: Secondary | ICD-10-CM | POA: Diagnosis not present

## 2018-01-06 DIAGNOSIS — I129 Hypertensive chronic kidney disease with stage 1 through stage 4 chronic kidney disease, or unspecified chronic kidney disease: Secondary | ICD-10-CM | POA: Diagnosis not present

## 2018-01-06 DIAGNOSIS — M1990 Unspecified osteoarthritis, unspecified site: Secondary | ICD-10-CM | POA: Diagnosis not present

## 2018-01-06 DIAGNOSIS — E1122 Type 2 diabetes mellitus with diabetic chronic kidney disease: Secondary | ICD-10-CM | POA: Diagnosis not present

## 2018-01-09 DIAGNOSIS — E1122 Type 2 diabetes mellitus with diabetic chronic kidney disease: Secondary | ICD-10-CM | POA: Diagnosis not present

## 2018-01-09 DIAGNOSIS — I129 Hypertensive chronic kidney disease with stage 1 through stage 4 chronic kidney disease, or unspecified chronic kidney disease: Secondary | ICD-10-CM | POA: Diagnosis not present

## 2018-01-09 DIAGNOSIS — G8929 Other chronic pain: Secondary | ICD-10-CM | POA: Diagnosis not present

## 2018-01-09 DIAGNOSIS — M6281 Muscle weakness (generalized): Secondary | ICD-10-CM | POA: Diagnosis not present

## 2018-01-09 DIAGNOSIS — M5441 Lumbago with sciatica, right side: Secondary | ICD-10-CM | POA: Diagnosis not present

## 2018-01-09 DIAGNOSIS — M1990 Unspecified osteoarthritis, unspecified site: Secondary | ICD-10-CM | POA: Diagnosis not present

## 2018-01-10 ENCOUNTER — Emergency Department (HOSPITAL_COMMUNITY): Payer: Medicare Other

## 2018-01-10 ENCOUNTER — Encounter (HOSPITAL_COMMUNITY): Payer: Self-pay

## 2018-01-10 ENCOUNTER — Other Ambulatory Visit: Payer: Self-pay

## 2018-01-10 ENCOUNTER — Emergency Department (HOSPITAL_COMMUNITY)
Admission: EM | Admit: 2018-01-10 | Discharge: 2018-01-10 | Disposition: A | Discharge status: Home / Self Care | Payer: Medicare Other | Attending: Emergency Medicine | Admitting: Emergency Medicine

## 2018-01-10 DIAGNOSIS — M1990 Unspecified osteoarthritis, unspecified site: Secondary | ICD-10-CM | POA: Diagnosis not present

## 2018-01-10 DIAGNOSIS — N183 Chronic kidney disease, stage 3 (moderate): Secondary | ICD-10-CM | POA: Insufficient documentation

## 2018-01-10 DIAGNOSIS — E119 Type 2 diabetes mellitus without complications: Secondary | ICD-10-CM | POA: Diagnosis not present

## 2018-01-10 DIAGNOSIS — E1165 Type 2 diabetes mellitus with hyperglycemia: Secondary | ICD-10-CM | POA: Diagnosis not present

## 2018-01-10 DIAGNOSIS — Z79899 Other long term (current) drug therapy: Secondary | ICD-10-CM | POA: Diagnosis not present

## 2018-01-10 DIAGNOSIS — M25531 Pain in right wrist: Secondary | ICD-10-CM | POA: Diagnosis not present

## 2018-01-10 DIAGNOSIS — Z87891 Personal history of nicotine dependence: Secondary | ICD-10-CM | POA: Insufficient documentation

## 2018-01-10 DIAGNOSIS — M6281 Muscle weakness (generalized): Secondary | ICD-10-CM | POA: Diagnosis not present

## 2018-01-10 DIAGNOSIS — M10032 Idiopathic gout, left wrist: Secondary | ICD-10-CM | POA: Diagnosis not present

## 2018-01-10 DIAGNOSIS — M7989 Other specified soft tissue disorders: Secondary | ICD-10-CM | POA: Diagnosis not present

## 2018-01-10 DIAGNOSIS — R52 Pain, unspecified: Secondary | ICD-10-CM | POA: Diagnosis not present

## 2018-01-10 DIAGNOSIS — M109 Gout, unspecified: Secondary | ICD-10-CM

## 2018-01-10 DIAGNOSIS — I10 Essential (primary) hypertension: Secondary | ICD-10-CM | POA: Diagnosis not present

## 2018-01-10 DIAGNOSIS — G8929 Other chronic pain: Secondary | ICD-10-CM | POA: Diagnosis not present

## 2018-01-10 DIAGNOSIS — I129 Hypertensive chronic kidney disease with stage 1 through stage 4 chronic kidney disease, or unspecified chronic kidney disease: Secondary | ICD-10-CM | POA: Insufficient documentation

## 2018-01-10 DIAGNOSIS — E1122 Type 2 diabetes mellitus with diabetic chronic kidney disease: Secondary | ICD-10-CM | POA: Diagnosis not present

## 2018-01-10 DIAGNOSIS — M5441 Lumbago with sciatica, right side: Secondary | ICD-10-CM | POA: Diagnosis not present

## 2018-01-10 DIAGNOSIS — M2540 Effusion, unspecified joint: Secondary | ICD-10-CM | POA: Diagnosis not present

## 2018-01-10 DIAGNOSIS — M25532 Pain in left wrist: Secondary | ICD-10-CM | POA: Diagnosis not present

## 2018-01-10 DIAGNOSIS — M25539 Pain in unspecified wrist: Secondary | ICD-10-CM | POA: Diagnosis not present

## 2018-01-10 DIAGNOSIS — M10031 Idiopathic gout, right wrist: Secondary | ICD-10-CM | POA: Insufficient documentation

## 2018-01-10 DIAGNOSIS — R609 Edema, unspecified: Secondary | ICD-10-CM | POA: Diagnosis not present

## 2018-01-10 LAB — BASIC METABOLIC PANEL
Anion gap: 8 (ref 5–15)
BUN: 28 mg/dL — ABNORMAL HIGH (ref 8–23)
CO2: 23 mmol/L (ref 22–32)
Calcium: 8 mg/dL — ABNORMAL LOW (ref 8.9–10.3)
Chloride: 104 mmol/L (ref 98–111)
Creatinine, Ser: 1.36 mg/dL — ABNORMAL HIGH (ref 0.61–1.24)
GFR calc Af Amer: 54 mL/min — ABNORMAL LOW (ref >60)
GFR calc non Af Amer: 46 mL/min — ABNORMAL LOW (ref >60)
Glucose, Bld: 207 mg/dL — ABNORMAL HIGH (ref 70–99)
Potassium: 3.7 mmol/L (ref 3.5–5.1)
Sodium: 135 mmol/L (ref 135–145)

## 2018-01-10 LAB — CBC WITH DIFFERENTIAL/PLATELET
Abs Immature Granulocytes: 0.11 10*3/uL — ABNORMAL HIGH (ref 0.00–0.07)
Basophils Absolute: 0.1 10*3/uL (ref 0.0–0.1)
Basophils Relative: 1 %
EOS PCT: 2 %
Eosinophils Absolute: 0.3 10*3/uL (ref 0.0–0.5)
HCT: 32.6 % — ABNORMAL LOW (ref 39.0–52.0)
HEMOGLOBIN: 10 g/dL — AB (ref 13.0–17.0)
Immature Granulocytes: 1 %
Lymphocytes Relative: 16 %
Lymphs Abs: 1.8 10*3/uL (ref 0.7–4.0)
MCH: 29.9 pg (ref 26.0–34.0)
MCHC: 30.7 g/dL (ref 30.0–36.0)
MCV: 97.6 fL (ref 80.0–100.0)
Monocytes Absolute: 1.1 10*3/uL — ABNORMAL HIGH (ref 0.1–1.0)
Monocytes Relative: 10 %
Neutro Abs: 7.8 10*3/uL — ABNORMAL HIGH (ref 1.7–7.7)
Neutrophils Relative %: 70 %
Platelets: 277 10*3/uL (ref 150–400)
RBC: 3.34 MIL/uL — AB (ref 4.22–5.81)
RDW: 11.8 % (ref 11.5–15.5)
WBC: 11.1 10*3/uL — ABNORMAL HIGH (ref 4.0–10.5)
nRBC: 0 % (ref 0.0–0.2)

## 2018-01-10 LAB — URIC ACID: Uric Acid, Serum: 6.7 mg/dL (ref 3.7–8.6)

## 2018-01-10 MED ORDER — COLCHICINE 0.6 MG PO TABS: 0.6000 mg | ORAL_TABLET | Freq: Every day | ORAL | 0 refills | Status: DC

## 2018-01-10 MED ORDER — COLCHICINE 0.6 MG PO TABS
0.6000 mg | ORAL_TABLET | Freq: Once | ORAL | Status: AC
  Administered 2018-01-10: 0.6 mg via ORAL
  Filled 2018-01-10: qty 1

## 2018-01-10 NOTE — ED Provider Notes (Signed)
[facility] EMERGENCY DEPARTMENT Provider Note   CSN: [id number] Arrival date & time: 01/10/18  1212     History   Chief Complaint Chief Complaint  Patient presents with  . Wrist Pain    HPI Mike Briggs is a 84 y.o. male.  HPI Patient presents with bilateral wrist pain for 2 weeks.  Gradual onset.  No known trauma.  He has had swelling and warmth.  Left greater than right.  Family is placed a heating pad on his left wrist and has a small burn there.  Patient has a history of gout.  He is no longer taking colchicine.  No fever or chills. Past Medical History:  Diagnosis Date  . Arthritis   . Cataract   . Chronic kidney disease   . Depression   . Diabetes mellitus   . Hyperlipidemia   . Hypertension   . Stroke [facility])     Patient Active Problem List   Diagnosis Date Noted  . Vitamin D deficiency 05/31/2016  . Essential hypertension 05/22/2016  . Hemiparesis affecting right side as late effect of cerebrovascular accident (CVA) ([place]) 05/22/2016  . Personal history of gout 05/22/2016  . BPH (benign prostatic hyperplasia) 05/22/2016  . Chronic constipation 05/22/2016  . HLD (hyperlipidemia) 05/22/2016  . Pseudophakia 12/06/2015  . Vascular dementia ([place]) 10/23/2012  . Chronic kidney disease, stage III (moderate) ([place]) 10/20/2012  . Type 2 diabetes mellitus with diabetic chronic kidney disease ([place]) 12/16/2008  . ANKLE, ARTHRITIS, DEGEN./OSTEO 12/16/2008    Past Surgical History:  Procedure Laterality Date  . BACK SURGERY     cervical and lumbar  . CATARACT EXTRACTION    . CHOLECYSTECTOMY    . FOOT SURGERY     5th toe   . SPINE SURGERY     lumbar and cervical fusions        Home Medications    Prior to Admission medications   Medication Sig Start Date End Date Taking? Authorizing Provider  atorvastatin (LIPITOR) 10 MG tablet TAKE 1 TABLET BY MOUTH AFTER SUPPER. 08/07/17  Yes Hagler, Apolonio Schneiders, MD  lisinopril (PRINIVIL,ZESTRIL) 40 MG tablet TAKE (1) TABLET  BY MOUTH ONCE DAILY. 05/20/17  Yes Raylene Everts, MD  memantine (NAMENDA) 10 MG tablet Take 10 mg by mouth daily.  04/18/17  Yes [provider]  metoprolol succinate (TOPROL-XL) 25 MG 24 hr tablet TAKE (1) TABLET BY MOUTH ONCE DAILY. 05/20/17  Yes Raylene Everts, MD  tamsulosin (FLOMAX) 0.4 MG CAPS capsule TAKE 1 CAPSULE BY MOUTH TWICE DAILY. 10/16/17  Yes Caren Macadam, MD  Vitamin D, Ergocalciferol, (DRISDOL) 50000 units CAPS capsule Take 1 capsule (50,000 Units total) by mouth every 7 (seven) days. 09/12/17  Yes Hagler, Apolonio Schneiders, MD  colchicine 0.6 MG tablet Take 1 tablet (0.6 mg total) by mouth daily. 01/10/18   Julianne Rice, MD    Family History Family History  Family history unknown: Yes    Social History Social History   Tobacco Use  . Smoking status: Former Smoker    Last attempt to quit: 08/07/1967    Years since quitting: 50.4  . Smokeless tobacco: Never Used  Substance Use Topics  . Alcohol use: No  . Drug use: No     Allergies   Patient has no known allergies.   Review of Systems Review of Systems  Constitutional: Negative for chills and fever.  Musculoskeletal: Positive for arthralgias and joint swelling.  Skin: Positive for wound. Negative for rash.  Neurological: Negative for weakness  and numbness.  All other systems reviewed and are negative.    Physical Exam Updated Vital Signs BP (!) 146/81   Pulse (!) 40   Temp 98.7 F (37.1 C) (Oral)   Resp 18   Ht 5\' 11"  (1.803 m)   Wt 111 kg   SpO2 98%   BMI 34.13 kg/m   Physical Exam Vitals signs and nursing note reviewed.  Constitutional:      General: He is not in acute distress.    Appearance: Normal appearance. He is well-developed. He is not ill-appearing.  HENT:     Head: Normocephalic and atraumatic.  Eyes:     Pupils: Pupils are equal, round, and reactive to light.  Neck:     Musculoskeletal: Normal range of motion and neck supple.  Cardiovascular:     Rate and Rhythm:  Normal rate and regular rhythm.  Pulmonary:     Effort: Pulmonary effort is normal.     Breath sounds: Normal breath sounds.  Abdominal:     General: Bowel sounds are normal.     Palpations: Abdomen is soft.     Tenderness: There is no abdominal tenderness. There is no guarding or rebound.  Musculoskeletal: Normal range of motion.        General: Swelling and tenderness present.     Comments: Patient with left greater than right wrist warmth and swelling.  Has tenderness to light touch.  Good distal pulses.  Skin:    General: Skin is warm and dry.     Findings: No erythema or rash.     Comments: Patient has blister over the dorsal surface of the proximal left wrist.  Unroofed.  Roughly 4 cm in length and 1 cm in width.  Neurological:     Mental Status: He is alert and oriented to person, place, and time.  Psychiatric:        Behavior: Behavior normal.      ED Treatments / Results  Labs (all labs ordered are listed, but only abnormal results are displayed) Labs Reviewed  CBC WITH DIFFERENTIAL/PLATELET - Abnormal; Notable for the following components:      Result Value   WBC 11.1 (*)    RBC 3.34 (*)    Hemoglobin 10.0 (*)    HCT 32.6 (*)    Neutro Abs 7.8 (*)    Monocytes Absolute 1.1 (*)    Abs Immature Granulocytes 0.11 (*)    All other components within normal limits  BASIC METABOLIC PANEL - Abnormal; Notable for the following components:   Glucose, Bld 207 (*)    BUN 28 (*)    Creatinine, Ser 1.36 (*)    Calcium 8.0 (*)    GFR calc non Af Amer 46 (*)    GFR calc Af Amer 54 (*)    All other components within normal limits  URIC ACID    EKG None  Radiology Dg Wrist Complete Left  Result Date: 01/10/2018 CLINICAL DATA:  Bilateral wrist pain and swelling.  No known injury. EXAM: LEFT WRIST - COMPLETE 3+ VIEW COMPARISON:  None. FINDINGS: The mineralization and alignment are normal. There is no evidence of acute fracture or dislocation. Lunotriquetral coalition  noted incidentally. There are mild intercarpal and metacarpal phalangeal degenerative changes. No erosive changes are identified. Possible diffuse soft tissue swelling, greater than that seen on the opposite side. No evidence of foreign body or soft tissue emphysema. IMPRESSION: Nonspecific soft tissue prominence, possibly swelling. No acute osseous findings or evidence of osteomyelitis.  Electronically Signed   By: Richardean Sale M.D.   On: 01/10/2018 17:13   Dg Wrist Complete Right  Result Date: 01/10/2018 CLINICAL DATA:  Bilateral wrist pain and swelling.  No known injury. EXAM: RIGHT WRIST - COMPLETE 3+ VIEW COMPARISON:  None. FINDINGS: The mineralization and alignment are normal. There is no evidence of acute fracture or dislocation. Lunotriquetral coalition noted incidentally. There are minimal intercarpal and metacarpal phalangeal degenerative changes. No erosive changes or bone destruction identified. Possible mild ulnar soft tissue swelling. IMPRESSION: No acute osseous findings or radiographic evidence of osteomyelitis. Possible mild ulnar soft tissue swelling. Electronically Signed   By: Richardean Sale M.D.   On: 01/10/2018 17:14    Procedures Procedures (including critical care time)  Medications Ordered in ED Medications  colchicine tablet 0.6 mg (0.6 mg Oral Given 01/10/18 1551)     Initial Impression / Assessment and Plan / ED Course  I have reviewed the triage vital signs and the nursing notes.  Pertinent labs & imaging results that were available during my care of the patient were reviewed by me and considered in my medical decision making (see chart for details).     Low suspicion for septic arthritis given multi-joint involvement.  Patient has history of gout.  Has not been taking his colchicine.  Mild elevation in white blood cell count.  Patient given dose of colchicine and states his wrists are feeling much better.  He is advised to follow-up with a primary provider.   Will start back colchicine and return precautions given.  Final Clinical Impressions(s) / ED Diagnoses   Final diagnoses:  Acute gout of wrist, unspecified cause, unspecified laterality    ED Discharge Orders         Ordered    colchicine 0.6 MG tablet  Daily     01/10/18 1734           Julianne Rice, MD 01/10/18 1736

## 2018-01-10 NOTE — ED Notes (Signed)
Lab at the bedside, pt voiding in urinal with assistance

## 2018-01-10 NOTE — ED Triage Notes (Signed)
EMS reports was called out for bilateral wrist pain.  Reports pt was having PT and therapist suggested pt has an infection.  PT has swelling to both wrists, left worse than r.  Reports family put a hot compress on left wrist and pt has a blister to left wrist.  Pt alert and oriented.

## 2018-01-13 DIAGNOSIS — N400 Enlarged prostate without lower urinary tract symptoms: Secondary | ICD-10-CM | POA: Diagnosis not present

## 2018-01-13 DIAGNOSIS — G8190 Hemiplegia, unspecified affecting unspecified side: Secondary | ICD-10-CM | POA: Diagnosis not present

## 2018-01-13 DIAGNOSIS — N189 Chronic kidney disease, unspecified: Secondary | ICD-10-CM | POA: Diagnosis not present

## 2018-01-13 DIAGNOSIS — I10 Essential (primary) hypertension: Secondary | ICD-10-CM | POA: Diagnosis not present

## 2018-01-16 DIAGNOSIS — I129 Hypertensive chronic kidney disease with stage 1 through stage 4 chronic kidney disease, or unspecified chronic kidney disease: Secondary | ICD-10-CM | POA: Diagnosis not present

## 2018-01-16 DIAGNOSIS — E1122 Type 2 diabetes mellitus with diabetic chronic kidney disease: Secondary | ICD-10-CM | POA: Diagnosis not present

## 2018-01-16 DIAGNOSIS — M5441 Lumbago with sciatica, right side: Secondary | ICD-10-CM | POA: Diagnosis not present

## 2018-01-16 DIAGNOSIS — G8929 Other chronic pain: Secondary | ICD-10-CM | POA: Diagnosis not present

## 2018-01-16 DIAGNOSIS — M1990 Unspecified osteoarthritis, unspecified site: Secondary | ICD-10-CM | POA: Diagnosis not present

## 2018-01-16 DIAGNOSIS — M6281 Muscle weakness (generalized): Secondary | ICD-10-CM | POA: Diagnosis not present

## 2018-01-17 DIAGNOSIS — E1122 Type 2 diabetes mellitus with diabetic chronic kidney disease: Secondary | ICD-10-CM | POA: Diagnosis not present

## 2018-01-17 DIAGNOSIS — I129 Hypertensive chronic kidney disease with stage 1 through stage 4 chronic kidney disease, or unspecified chronic kidney disease: Secondary | ICD-10-CM | POA: Diagnosis not present

## 2018-01-17 DIAGNOSIS — M1990 Unspecified osteoarthritis, unspecified site: Secondary | ICD-10-CM | POA: Diagnosis not present

## 2018-01-17 DIAGNOSIS — M6281 Muscle weakness (generalized): Secondary | ICD-10-CM | POA: Diagnosis not present

## 2018-01-17 DIAGNOSIS — G8929 Other chronic pain: Secondary | ICD-10-CM | POA: Diagnosis not present

## 2018-01-17 DIAGNOSIS — M5441 Lumbago with sciatica, right side: Secondary | ICD-10-CM | POA: Diagnosis not present

## 2018-01-23 DIAGNOSIS — I129 Hypertensive chronic kidney disease with stage 1 through stage 4 chronic kidney disease, or unspecified chronic kidney disease: Secondary | ICD-10-CM | POA: Diagnosis not present

## 2018-01-23 DIAGNOSIS — M6281 Muscle weakness (generalized): Secondary | ICD-10-CM | POA: Diagnosis not present

## 2018-01-23 DIAGNOSIS — I10 Essential (primary) hypertension: Secondary | ICD-10-CM | POA: Diagnosis not present

## 2018-01-23 DIAGNOSIS — J449 Chronic obstructive pulmonary disease, unspecified: Secondary | ICD-10-CM | POA: Diagnosis not present

## 2018-01-23 DIAGNOSIS — G8190 Hemiplegia, unspecified affecting unspecified side: Secondary | ICD-10-CM | POA: Diagnosis not present

## 2018-01-23 DIAGNOSIS — G8929 Other chronic pain: Secondary | ICD-10-CM | POA: Diagnosis not present

## 2018-01-23 DIAGNOSIS — Z1389 Encounter for screening for other disorder: Secondary | ICD-10-CM | POA: Diagnosis not present

## 2018-01-23 DIAGNOSIS — M1990 Unspecified osteoarthritis, unspecified site: Secondary | ICD-10-CM | POA: Diagnosis not present

## 2018-01-23 DIAGNOSIS — N400 Enlarged prostate without lower urinary tract symptoms: Secondary | ICD-10-CM | POA: Diagnosis not present

## 2018-01-23 DIAGNOSIS — Z1331 Encounter for screening for depression: Secondary | ICD-10-CM | POA: Diagnosis not present

## 2018-01-23 DIAGNOSIS — M1000 Idiopathic gout, unspecified site: Secondary | ICD-10-CM | POA: Diagnosis not present

## 2018-01-23 DIAGNOSIS — E1122 Type 2 diabetes mellitus with diabetic chronic kidney disease: Secondary | ICD-10-CM | POA: Diagnosis not present

## 2018-01-23 DIAGNOSIS — Z0000 Encounter for general adult medical examination without abnormal findings: Secondary | ICD-10-CM | POA: Diagnosis not present

## 2018-01-23 DIAGNOSIS — M5441 Lumbago with sciatica, right side: Secondary | ICD-10-CM | POA: Diagnosis not present

## 2018-01-27 DIAGNOSIS — G8929 Other chronic pain: Secondary | ICD-10-CM | POA: Diagnosis not present

## 2018-01-27 DIAGNOSIS — M1990 Unspecified osteoarthritis, unspecified site: Secondary | ICD-10-CM | POA: Diagnosis not present

## 2018-01-27 DIAGNOSIS — M5441 Lumbago with sciatica, right side: Secondary | ICD-10-CM | POA: Diagnosis not present

## 2018-01-27 DIAGNOSIS — M6281 Muscle weakness (generalized): Secondary | ICD-10-CM | POA: Diagnosis not present

## 2018-01-27 DIAGNOSIS — I129 Hypertensive chronic kidney disease with stage 1 through stage 4 chronic kidney disease, or unspecified chronic kidney disease: Secondary | ICD-10-CM | POA: Diagnosis not present

## 2018-01-27 DIAGNOSIS — E1122 Type 2 diabetes mellitus with diabetic chronic kidney disease: Secondary | ICD-10-CM | POA: Diagnosis not present

## 2018-01-28 DIAGNOSIS — I129 Hypertensive chronic kidney disease with stage 1 through stage 4 chronic kidney disease, or unspecified chronic kidney disease: Secondary | ICD-10-CM | POA: Diagnosis not present

## 2018-01-28 DIAGNOSIS — M6281 Muscle weakness (generalized): Secondary | ICD-10-CM | POA: Diagnosis not present

## 2018-01-28 DIAGNOSIS — E1122 Type 2 diabetes mellitus with diabetic chronic kidney disease: Secondary | ICD-10-CM | POA: Diagnosis not present

## 2018-01-28 DIAGNOSIS — G8929 Other chronic pain: Secondary | ICD-10-CM | POA: Diagnosis not present

## 2018-01-28 DIAGNOSIS — M1990 Unspecified osteoarthritis, unspecified site: Secondary | ICD-10-CM | POA: Diagnosis not present

## 2018-01-28 DIAGNOSIS — M5441 Lumbago with sciatica, right side: Secondary | ICD-10-CM | POA: Diagnosis not present

## 2018-02-04 DIAGNOSIS — M1990 Unspecified osteoarthritis, unspecified site: Secondary | ICD-10-CM | POA: Diagnosis not present

## 2018-02-04 DIAGNOSIS — M5441 Lumbago with sciatica, right side: Secondary | ICD-10-CM | POA: Diagnosis not present

## 2018-02-04 DIAGNOSIS — E1122 Type 2 diabetes mellitus with diabetic chronic kidney disease: Secondary | ICD-10-CM | POA: Diagnosis not present

## 2018-02-04 DIAGNOSIS — I129 Hypertensive chronic kidney disease with stage 1 through stage 4 chronic kidney disease, or unspecified chronic kidney disease: Secondary | ICD-10-CM | POA: Diagnosis not present

## 2018-02-04 DIAGNOSIS — G8929 Other chronic pain: Secondary | ICD-10-CM | POA: Diagnosis not present

## 2018-02-04 DIAGNOSIS — M6281 Muscle weakness (generalized): Secondary | ICD-10-CM | POA: Diagnosis not present

## 2018-02-07 DIAGNOSIS — G8929 Other chronic pain: Secondary | ICD-10-CM | POA: Diagnosis not present

## 2018-02-07 DIAGNOSIS — M5441 Lumbago with sciatica, right side: Secondary | ICD-10-CM | POA: Diagnosis not present

## 2018-02-07 DIAGNOSIS — M1990 Unspecified osteoarthritis, unspecified site: Secondary | ICD-10-CM | POA: Diagnosis not present

## 2018-02-07 DIAGNOSIS — M6281 Muscle weakness (generalized): Secondary | ICD-10-CM | POA: Diagnosis not present

## 2018-02-07 DIAGNOSIS — E1122 Type 2 diabetes mellitus with diabetic chronic kidney disease: Secondary | ICD-10-CM | POA: Diagnosis not present

## 2018-02-07 DIAGNOSIS — I129 Hypertensive chronic kidney disease with stage 1 through stage 4 chronic kidney disease, or unspecified chronic kidney disease: Secondary | ICD-10-CM | POA: Diagnosis not present

## 2018-02-21 ENCOUNTER — Other Ambulatory Visit (HOSPITAL_COMMUNITY): Payer: Self-pay | Admitting: Nephrology

## 2018-02-21 DIAGNOSIS — N183 Chronic kidney disease, stage 3 unspecified: Secondary | ICD-10-CM

## 2018-02-21 DIAGNOSIS — N433 Hydrocele, unspecified: Secondary | ICD-10-CM

## 2018-02-25 ENCOUNTER — Inpatient Hospital Stay (HOSPITAL_COMMUNITY)
Admission: EM | Admit: 2018-02-25 | Discharge: 2018-02-28 | DRG: 690 | Disposition: A | Discharge status: Home Health | Payer: Medicare Other | Attending: Family Medicine | Admitting: Family Medicine

## 2018-02-25 ENCOUNTER — Encounter (HOSPITAL_COMMUNITY): Payer: Self-pay | Admitting: Emergency Medicine

## 2018-02-25 ENCOUNTER — Observation Stay (HOSPITAL_COMMUNITY): Payer: Medicare Other

## 2018-02-25 ENCOUNTER — Other Ambulatory Visit: Payer: Self-pay

## 2018-02-25 ENCOUNTER — Emergency Department (HOSPITAL_COMMUNITY): Payer: Medicare Other

## 2018-02-25 DIAGNOSIS — N183 Chronic kidney disease, stage 3 unspecified: Secondary | ICD-10-CM | POA: Diagnosis present

## 2018-02-25 DIAGNOSIS — N289 Disorder of kidney and ureter, unspecified: Secondary | ICD-10-CM | POA: Diagnosis not present

## 2018-02-25 DIAGNOSIS — B9689 Other specified bacterial agents as the cause of diseases classified elsewhere: Secondary | ICD-10-CM | POA: Diagnosis present

## 2018-02-25 DIAGNOSIS — E1122 Type 2 diabetes mellitus with diabetic chronic kidney disease: Secondary | ICD-10-CM | POA: Diagnosis present

## 2018-02-25 DIAGNOSIS — I69351 Hemiplegia and hemiparesis following cerebral infarction affecting right dominant side: Secondary | ICD-10-CM

## 2018-02-25 DIAGNOSIS — R32 Unspecified urinary incontinence: Secondary | ICD-10-CM | POA: Diagnosis present

## 2018-02-25 DIAGNOSIS — R55 Syncope and collapse: Secondary | ICD-10-CM | POA: Diagnosis not present

## 2018-02-25 DIAGNOSIS — R151 Fecal smearing: Secondary | ICD-10-CM | POA: Diagnosis not present

## 2018-02-25 DIAGNOSIS — R4182 Altered mental status, unspecified: Secondary | ICD-10-CM | POA: Diagnosis not present

## 2018-02-25 DIAGNOSIS — E785 Hyperlipidemia, unspecified: Secondary | ICD-10-CM | POA: Diagnosis present

## 2018-02-25 DIAGNOSIS — D649 Anemia, unspecified: Secondary | ICD-10-CM

## 2018-02-25 DIAGNOSIS — R0689 Other abnormalities of breathing: Secondary | ICD-10-CM | POA: Diagnosis not present

## 2018-02-25 DIAGNOSIS — Z87891 Personal history of nicotine dependence: Secondary | ICD-10-CM

## 2018-02-25 DIAGNOSIS — F329 Major depressive disorder, single episode, unspecified: Secondary | ICD-10-CM | POA: Diagnosis present

## 2018-02-25 DIAGNOSIS — I129 Hypertensive chronic kidney disease with stage 1 through stage 4 chronic kidney disease, or unspecified chronic kidney disease: Secondary | ICD-10-CM | POA: Diagnosis present

## 2018-02-25 DIAGNOSIS — E860 Dehydration: Secondary | ICD-10-CM | POA: Diagnosis not present

## 2018-02-25 DIAGNOSIS — R4020 Unspecified coma: Secondary | ICD-10-CM | POA: Diagnosis not present

## 2018-02-25 DIAGNOSIS — F0150 Vascular dementia without behavioral disturbance: Secondary | ICD-10-CM | POA: Diagnosis present

## 2018-02-25 DIAGNOSIS — N179 Acute kidney failure, unspecified: Secondary | ICD-10-CM | POA: Diagnosis not present

## 2018-02-25 DIAGNOSIS — N400 Enlarged prostate without lower urinary tract symptoms: Secondary | ICD-10-CM | POA: Diagnosis present

## 2018-02-25 DIAGNOSIS — I10 Essential (primary) hypertension: Secondary | ICD-10-CM | POA: Diagnosis present

## 2018-02-25 DIAGNOSIS — N390 Urinary tract infection, site not specified: Principal | ICD-10-CM

## 2018-02-25 DIAGNOSIS — Z79899 Other long term (current) drug therapy: Secondary | ICD-10-CM

## 2018-02-25 HISTORY — DX: Syncope and collapse: R55

## 2018-02-25 LAB — COMPREHENSIVE METABOLIC PANEL
ALT: 15 U/L (ref 0–44)
AST: 17 U/L (ref 15–41)
Albumin: 3.1 g/dL — ABNORMAL LOW (ref 3.5–5.0)
Alkaline Phosphatase: 159 U/L — ABNORMAL HIGH (ref 38–126)
Anion gap: 7 (ref 5–15)
BUN: 27 mg/dL — ABNORMAL HIGH (ref 8–23)
CO2: 26 mmol/L (ref 22–32)
Calcium: 8.7 mg/dL — ABNORMAL LOW (ref 8.9–10.3)
Chloride: 105 mmol/L (ref 98–111)
Creatinine, Ser: 1.72 mg/dL — ABNORMAL HIGH (ref 0.61–1.24)
GFR calc non Af Amer: 35 mL/min — ABNORMAL LOW (ref >60)
GFR, EST AFRICAN AMERICAN: 41 mL/min — AB (ref >60)
Glucose, Bld: 258 mg/dL — ABNORMAL HIGH (ref 70–99)
Potassium: 4.8 mmol/L (ref 3.5–5.1)
SODIUM: 138 mmol/L (ref 135–145)
Total Bilirubin: 0.5 mg/dL (ref 0.3–1.2)
Total Protein: 7.3 g/dL (ref 6.5–8.1)

## 2018-02-25 LAB — URINALYSIS, ROUTINE W REFLEX MICROSCOPIC
Bilirubin Urine: NEGATIVE
Glucose, UA: NEGATIVE mg/dL
Ketones, ur: NEGATIVE mg/dL
Nitrite: NEGATIVE
Protein, ur: 100 mg/dL — AB
Specific Gravity, Urine: 1.012 (ref 1.005–1.030)
WBC, UA: >50 WBC/hpf — ABNORMAL HIGH (ref 0–5)
pH: 6 (ref 5.0–8.0)

## 2018-02-25 LAB — CBC WITH DIFFERENTIAL/PLATELET
Abs Immature Granulocytes: 0.04 10*3/uL (ref 0.00–0.07)
BASOS ABS: 0 10*3/uL (ref 0.0–0.1)
Basophils Relative: 0 %
Eosinophils Absolute: 0.2 10*3/uL (ref 0.0–0.5)
Eosinophils Relative: 2 %
HEMATOCRIT: 41.6 % (ref 39.0–52.0)
Hemoglobin: 12.3 g/dL — ABNORMAL LOW (ref 13.0–17.0)
Immature Granulocytes: 0 %
Lymphocytes Relative: 16 %
Lymphs Abs: 1.4 10*3/uL (ref 0.7–4.0)
MCH: 28.5 pg (ref 26.0–34.0)
MCHC: 29.6 g/dL — ABNORMAL LOW (ref 30.0–36.0)
MCV: 96.3 fL (ref 80.0–100.0)
Monocytes Absolute: 0.6 10*3/uL (ref 0.1–1.0)
Monocytes Relative: 7 %
Neutro Abs: 6.8 10*3/uL (ref 1.7–7.7)
Neutrophils Relative %: 75 %
Platelets: 232 10*3/uL (ref 150–400)
RBC: 4.32 MIL/uL (ref 4.22–5.81)
RDW: 12.8 % (ref 11.5–15.5)
WBC: 9.1 10*3/uL (ref 4.0–10.5)
nRBC: 0 % (ref 0.0–0.2)

## 2018-02-25 LAB — CBG MONITORING, ED: Glucose-Capillary: 236 mg/dL — ABNORMAL HIGH (ref 70–99)

## 2018-02-25 LAB — TROPONIN I: Troponin I: <0.03 ng/mL (ref <0.03)

## 2018-02-25 LAB — MAGNESIUM: Magnesium: 1.5 mg/dL — ABNORMAL LOW (ref 1.7–2.4)

## 2018-02-25 MED ORDER — SODIUM CHLORIDE 0.9 % IV BOLUS
500.0000 mL | Freq: Once | INTRAVENOUS | Status: AC
  Administered 2018-02-25: 500 mL via INTRAVENOUS

## 2018-02-25 MED ORDER — ONDANSETRON HCL 4 MG PO TABS: 4.0000 mg | ORAL_TABLET | Freq: Four times a day (QID) | ORAL | Status: DC | PRN

## 2018-02-25 MED ORDER — ENOXAPARIN SODIUM 40 MG/0.4ML ~~LOC~~ SOLN
40.0000 mg | SUBCUTANEOUS | Status: DC
  Administered 2018-02-25 – 2018-02-27 (×3): 40 mg via SUBCUTANEOUS
  Filled 2018-02-25 (×3): qty 0.4

## 2018-02-25 MED ORDER — HYDRALAZINE HCL 20 MG/ML IJ SOLN
10.0000 mg | Freq: Once | INTRAMUSCULAR | Status: AC
  Administered 2018-02-25: 10 mg via INTRAVENOUS
  Filled 2018-02-25: qty 1

## 2018-02-25 MED ORDER — ACETAMINOPHEN 650 MG RE SUPP: 650.0000 mg | Freq: Four times a day (QID) | RECTAL | Status: DC | PRN

## 2018-02-25 MED ORDER — COLCHICINE 0.6 MG PO TABS
0.6000 mg | ORAL_TABLET | Freq: Every morning | ORAL | Status: DC
  Administered 2018-02-26 – 2018-02-28 (×3): 0.6 mg via ORAL
  Filled 2018-02-25 (×3): qty 1

## 2018-02-25 MED ORDER — ACETAMINOPHEN 325 MG PO TABS: 650.0000 mg | ORAL_TABLET | Freq: Four times a day (QID) | ORAL | Status: DC | PRN

## 2018-02-25 MED ORDER — MAGNESIUM SULFATE 2 GM/50ML IV SOLN
2.0000 g | Freq: Once | INTRAVENOUS | Status: AC
  Administered 2018-02-25: 2 g via INTRAVENOUS
  Filled 2018-02-25: qty 50

## 2018-02-25 MED ORDER — LISINOPRIL 10 MG PO TABS
40.0000 mg | ORAL_TABLET | Freq: Every morning | ORAL | Status: DC
  Administered 2018-02-26 – 2018-02-28 (×3): 40 mg via ORAL
  Filled 2018-02-25 (×3): qty 4

## 2018-02-25 MED ORDER — ATORVASTATIN CALCIUM 10 MG PO TABS
10.0000 mg | ORAL_TABLET | Freq: Every evening | ORAL | Status: DC
  Administered 2018-02-25 – 2018-02-27 (×3): 10 mg via ORAL
  Filled 2018-02-25 (×3): qty 1

## 2018-02-25 MED ORDER — MEMANTINE HCL 10 MG PO TABS
10.0000 mg | ORAL_TABLET | Freq: Two times a day (BID) | ORAL | Status: DC
  Administered 2018-02-25 – 2018-02-28 (×6): 10 mg via ORAL
  Filled 2018-02-25 (×6): qty 1

## 2018-02-25 MED ORDER — SODIUM CHLORIDE 0.9 % IV SOLN
1.0000 g | INTRAVENOUS | Status: DC
  Administered 2018-02-26 – 2018-02-27 (×2): 1 g via INTRAVENOUS
  Filled 2018-02-25: qty 10
  Filled 2018-02-25 (×2): qty 1
  Filled 2018-02-25 (×2): qty 10

## 2018-02-25 MED ORDER — ONDANSETRON HCL 4 MG/2ML IJ SOLN: 4.0000 mg | Freq: Four times a day (QID) | INTRAMUSCULAR | Status: DC | PRN

## 2018-02-25 MED ORDER — SODIUM CHLORIDE 0.9 % IV SOLN
INTRAVENOUS | Status: AC
  Administered 2018-02-26: 06:00:00 via INTRAVENOUS

## 2018-02-25 MED ORDER — TAMSULOSIN HCL 0.4 MG PO CAPS
0.4000 mg | ORAL_CAPSULE | Freq: Every day | ORAL | Status: DC
  Administered 2018-02-26 – 2018-02-28 (×3): 0.4 mg via ORAL
  Filled 2018-02-25 (×3): qty 1

## 2018-02-25 MED ORDER — SODIUM CHLORIDE 0.9 % IV SOLN
2.0000 g | Freq: Once | INTRAVENOUS | Status: AC
  Administered 2018-02-25: 2 g via INTRAVENOUS
  Filled 2018-02-25: qty 20

## 2018-02-25 MED ORDER — METOPROLOL SUCCINATE ER 25 MG PO TB24
25.0000 mg | ORAL_TABLET | Freq: Every morning | ORAL | Status: DC
  Administered 2018-02-26 – 2018-02-28 (×3): 25 mg via ORAL
  Filled 2018-02-25 (×3): qty 1

## 2018-02-25 MED ORDER — POLYETHYLENE GLYCOL 3350 17 G PO PACK: 17.0000 g | PACK | Freq: Every day | ORAL | Status: DC | PRN

## 2018-02-25 MED ORDER — SODIUM CHLORIDE 0.9 % IV SOLN
INTRAVENOUS | Status: DC
  Administered 2018-02-25: 15:00:00 via INTRAVENOUS

## 2018-02-25 NOTE — ED Provider Notes (Signed)
[facility] EMERGENCY DEPARTMENT Provider Note   CSN: [id number] Arrival date & time: 02/25/18  1402     History   Chief Complaint Chief Complaint  Patient presents with  . Loss of Consciousness    HPI Mike Briggs is a 85 y.o. male.  HPI   He presents for evaluation of syncope, by EMS.  Arrival he is diapered with fecal soiling.  Level 5 caveat-poor historian  Past Medical History:  Diagnosis Date  . Arthritis   . Cataract   . Chronic kidney disease   . Depression   . Diabetes mellitus   . Hyperlipidemia   . Hypertension   . Stroke [facility])     Patient Active Problem List   Diagnosis Date Noted  . Vitamin D deficiency 05/31/2016  . Essential hypertension 05/22/2016  . Hemiparesis affecting right side as late effect of cerebrovascular accident (CVA) ([place]) 05/22/2016  . Personal history of gout 05/22/2016  . BPH (benign prostatic hyperplasia) 05/22/2016  . Chronic constipation 05/22/2016  . HLD (hyperlipidemia) 05/22/2016  . Pseudophakia 12/06/2015  . Vascular dementia ([place]) 10/23/2012  . Chronic kidney disease, stage III (moderate) ([place]) 10/20/2012  . Type 2 diabetes mellitus with diabetic chronic kidney disease ([place]) 12/16/2008  . ANKLE, ARTHRITIS, DEGEN./OSTEO 12/16/2008    Past Surgical History:  Procedure Laterality Date  . BACK SURGERY     cervical and lumbar  . CATARACT EXTRACTION    . CHOLECYSTECTOMY    . FOOT SURGERY     5th toe   . SPINE SURGERY     lumbar and cervical fusions        Home Medications    Prior to Admission medications   Medication Sig Start Date End Date Taking? Authorizing Provider  atorvastatin (LIPITOR) 10 MG tablet TAKE 1 TABLET BY MOUTH AFTER SUPPER. Patient taking differently: Take 10 mg by mouth every evening.  08/07/17  Yes Hagler, Apolonio Schneiders, MD  colchicine 0.6 MG tablet Take 1 tablet (0.6 mg total) by mouth daily. Patient taking differently: Take 0.6 mg by mouth every morning.  01/10/18  Yes Julianne Rice, MD   lisinopril (PRINIVIL,ZESTRIL) 40 MG tablet TAKE (1) TABLET BY MOUTH ONCE DAILY. Patient taking differently: Take 40 mg by mouth every morning.  05/20/17  Yes Raylene Everts, MD  memantine (NAMENDA) 10 MG tablet Take 10 mg by mouth 2 (two) times daily.  04/18/17  Yes [provider]  metoprolol succinate (TOPROL-XL) 25 MG 24 hr tablet TAKE (1) TABLET BY MOUTH ONCE DAILY. Patient taking differently: Take 25 mg by mouth every morning.  05/20/17  Yes Raylene Everts, MD  tamsulosin (FLOMAX) 0.4 MG CAPS capsule TAKE 1 CAPSULE BY MOUTH TWICE DAILY. Patient taking differently: Take 0.4 mg by mouth 2 (two) times daily.  10/16/17  Yes Caren Macadam, MD    Family History Family History  Family history unknown: Yes    Social History Social History   Tobacco Use  . Smoking status: Former Smoker    Last attempt to quit: 08/07/1967    Years since quitting: 50.5  . Smokeless tobacco: Never Used  Substance Use Topics  . Alcohol use: No  . Drug use: No     Allergies   Patient has no known allergies.   Review of Systems Review of Systems  Unable to perform ROS: Mental status change     Physical Exam Updated Vital Signs BP (!) 168/74   Pulse 67   Resp 15   Ht '5\' 11"'$  (1.803  m)   Wt 97.3 kg   SpO2 98%   BMI 29.92 kg/m   Physical Exam   ED Treatments / Results  Labs (all labs ordered are listed, but only abnormal results are displayed) Labs Reviewed  URINALYSIS, ROUTINE W REFLEX MICROSCOPIC - Abnormal; Notable for the following components:      Result Value   Color, Urine AMBER (*)    APPearance TURBID (*)    Hgb urine dipstick MODERATE (*)    Protein, ur 100 (*)    Leukocytes, UA LARGE (*)    WBC, UA >50 (*)    Bacteria, UA FEW (*)    All other components within normal limits  COMPREHENSIVE METABOLIC PANEL - Abnormal; Notable for the following components:   Glucose, Bld 258 (*)    BUN 27 (*)    Creatinine, Ser 1.72 (*)    Calcium 8.7 (*)    Albumin 3.1  (*)    Alkaline Phosphatase 159 (*)    GFR calc non Af Amer 35 (*)    GFR calc Af Amer 41 (*)    All other components within normal limits  CBC WITH DIFFERENTIAL/PLATELET - Abnormal; Notable for the following components:   Hemoglobin 12.3 (*)    MCHC 29.6 (*)    All other components within normal limits  CBG MONITORING, ED - Abnormal; Notable for the following components:   Glucose-Capillary 236 (*)    All other components within normal limits  URINE CULTURE    EKG EKG Interpretation  Date/Time:  Tuesday February 25 2018 14:25:38 EST Ventricular Rate:  76 PR Interval:    QRS Duration: 93 QT Interval:  394 QTC Calculation: 443 R Axis:   8 Text Interpretation:  Sinus rhythm Prolonged PR interval Consider left atrial enlargement Low voltage, precordial leads Posterior infarct, old Since last tracing rate slower Confirmed by Daleen Bo [phone]) on 02/25/2018 3:11:03 PM   Radiology No results found.  Procedures Procedures (including critical care time)  Medications Ordered in ED Medications  0.9 %  sodium chloride infusion ( Intravenous New Bag/Given 02/25/18 1434)  cefTRIAXone (ROCEPHIN) 2 g in sodium chloride 0.9 % 100 mL IVPB (2 g Intravenous New Bag/Given 02/25/18 1822)  sodium chloride 0.9 % bolus 500 mL (0 mLs Intravenous Stopped 02/25/18 1534)     Initial Impression / Assessment and Plan / ED Course  I have reviewed the triage vital signs and the nursing notes.  Pertinent labs & imaging results that were available during my care of the patient were reviewed by me and considered in my medical decision making (see chart for details).  Clinical Course as of Feb 25 1842  Tue Feb 25, 2018  1511 Normal, except hemoglobin low  CBC with Differential(!) [EW]  79 The patient's niece is here now.  Her name is Waymon Budge.  She states that she "looks after him and is his guardian."  She apparently does not have legal papers, however.   [EW]  1536 Normal except glucose high,  BUN high, creatinine high, calcium low, albumin low, alk phos stays high, GFR low  Comprehensive metabolic panel(!) [EW]  2703 Renal functioning trending indicates mild elevation in creatinine from baseline, 1 month ago.  BUN stable.   [EW]  1653 At this time the patient's aide who is with him during the episode, arrived to give his report, of what happened.  The aide recalls sitting at a table with the patient, playing checkers, when the patient suddenly began to not respond.  Shortly thereafter, his eyes rolled back and he started drooling from his nose and mouth.  The aide called 911 who advised that the patient be placed on the floor, supine.  This was done and the patient quickly began to have noticeable respirations, a pulse was felt at the neck, and the patient began to respond by "blinking his eyes."  Upon arrival of EMS, the patient was able to respond verbally and recognize the situation.  The patient is currently back to his baseline according to the aide who is here now in the room.   [EW]  1810 Patient's caregiver is Waymon Budge.  Her phone number is [phone].  At this time she has to go home to attend to personal business.   [EW]    Clinical Course User Index [EW] Daleen Bo, MD     Patient Vitals for the past 24 hrs:  BP Pulse Resp SpO2 Height Weight  02/25/18 1800 (!) 168/74 67 15 98 % - -  02/25/18 1730 (!) 168/88 65 14 98 % - -  02/25/18 1700 (!) 163/82 70 14 97 % - -  02/25/18 1630 (!) 159/77 68 16 99 % - -  02/25/18 1600 140/73 74 15 98 % - -  02/25/18 1530 (!) 146/68 72 19 97 % - -  02/25/18 1509 (!) 153/77 79 20 95 % - -  02/25/18 1500 (!) 144/67 74 20 98 % - -  02/25/18 1430 (!) 144/74 73 20 99 % - -  02/25/18 1425 (!) 155/84 75 15 98 % - -  02/25/18 1406 - - - - '5\' 11"'$  (1.803 m) 97.3 kg    6:09 PM Reevaluation with update and discussion. After initial assessment and treatment, an updated evaluation reveals he continues to be alert and cooperative.   Strength normal arms and legs bilaterally.  He is lucid.  Findings discussed with patient, and his caregiver, all questions answered. Daleen Bo   Medical Decision Making: Syncope with evidence for UTI.  No evidence for sepsis, metabolic instability or hemodynamic instability.  Patient requires observation for insurance that he will continue to do well.  Doubt ACS, PE or pneumonia.   CRITICAL CARE-no Performed by: Daleen Bo  Nursing Notes Reviewed/ Care Coordinated Applicable Imaging Reviewed Interpretation of Laboratory Data incorporated into ED treatment   6:20 PM-Consult complete with hospitalist. Patient case explained and discussed.  She agrees to admit patient for further evaluation and treatment. Call ended at 6:38 PM  Plan: Admit  Final Clinical Impressions(s) / ED Diagnoses   Final diagnoses:  Syncope, unspecified syncope type  Urinary tract infection without hematuria, site unspecified  Anemia, unspecified type  Renal insufficiency    ED Discharge Orders    None       Daleen Bo, MD 02/25/18 1844

## 2018-02-25 NOTE — ED Triage Notes (Signed)
Pt from home, had a syncopal episode while sitting in wheelchair. Pt was sitting playing checkers and aid reported pt "went out." No shaking noted. When EMS arrived, pt was alert and sitting up. Pt was incontinent of urine and urine has a foul smell. Recently treated for UTI last month. CBG 280.

## 2018-02-25 NOTE — H&P (Addendum)
History and Physical    JOSAIAH MUHAMMED BOF:[id number] DOB: March 31, 1928 DOA: 02/25/2018  PCP: Patient, No Pcp Per  Patient coming from: Home  I have personally briefly reviewed patient's old medical records in [place]  Chief Complaint: Loss of consciousness  HPI: CADAN MAGGART is a 85 y.o. male with medical history significant DM2, CKD 3, vascular dementia, CVA, HTN, who was brought to the [facility] ED reports of loss of consciousness.  History is obtained from chart review and EDP, patient is unable to give me history as he cannot remember events of today, he is able to answer yes and no to simple questions.  Patient was sitting playing checkers with his aide, when reportedly patient "went out".  No shaking was noted.  Aide laid patient on the floor and was able to get a carotid pulse.  On EMS arrival patient was alert and sitting up.  Patient was incontinent of urine. To me patient denies chest pain or difficulty breathing at this time.  He admits to burning with urination.  Denies vomiting or loose stools.  ED Course: Elevated blood pressure 150s to 200s.  Mild and chronically elevated  ALP- 159.  Creatinine 1.7, reports creatinine tends to fluctuate between 1.6-1.7. EKG without significant change from prior.  UA--large leukocytes, few bacteria.  Sent was given 1 g ceftriaxone.  Hospitalist to admit for UTI and syncope.  Review of Systems: As per HPI all other systems reviewed and negative.  Past Medical History:  Diagnosis Date  . Arthritis   . Cataract   . Chronic kidney disease   . Depression   . Diabetes mellitus   . Hyperlipidemia   . Hypertension   . Stroke [facility])     Past Surgical History:  Procedure Laterality Date  . BACK SURGERY     cervical and lumbar  . CATARACT EXTRACTION    . CHOLECYSTECTOMY    . FOOT SURGERY     5th toe   . SPINE SURGERY     lumbar and cervical fusions     reports that he quit smoking about 50 years ago. He has never used smokeless  tobacco. He reports that he does not drink alcohol or use drugs.  No Known Allergies  Family History  Family history unknown: Yes    Prior to Admission medications   Medication Sig Start Date End Date Taking? Authorizing Provider  atorvastatin (LIPITOR) 10 MG tablet TAKE 1 TABLET BY MOUTH AFTER SUPPER. Patient taking differently: Take 10 mg by mouth every evening.  08/07/17  Yes Hagler, Apolonio Schneiders, MD  colchicine 0.6 MG tablet Take 1 tablet (0.6 mg total) by mouth daily. Patient taking differently: Take 0.6 mg by mouth every morning.  01/10/18  Yes Julianne Rice, MD  lisinopril (PRINIVIL,ZESTRIL) 40 MG tablet TAKE (1) TABLET BY MOUTH ONCE DAILY. Patient taking differently: Take 40 mg by mouth every morning.  05/20/17  Yes Raylene Everts, MD  memantine (NAMENDA) 10 MG tablet Take 10 mg by mouth 2 (two) times daily.  04/18/17  Yes [provider]  metoprolol succinate (TOPROL-XL) 25 MG 24 hr tablet TAKE (1) TABLET BY MOUTH ONCE DAILY. Patient taking differently: Take 25 mg by mouth every morning.  05/20/17  Yes Raylene Everts, MD  tamsulosin (FLOMAX) 0.4 MG CAPS capsule TAKE 1 CAPSULE BY MOUTH TWICE DAILY. Patient taking differently: Take 0.4 mg by mouth 2 (two) times daily.  10/16/17  Yes Caren Macadam, MD    Physical Exam: Vitals:  02/25/18 1630 02/25/18 1700 02/25/18 1730 02/25/18 1800  BP: (!) 159/77 (!) 163/82 (!) 168/88 (!) 168/74  Pulse: 68 70 65 67  Resp: 16 14 14 15   SpO2: 99% 97% 98% 98%  Weight:      Height:        Constitutional: NAD, calm, comfortable Vitals:   02/25/18 1630 02/25/18 1700 02/25/18 1730 02/25/18 1800  BP: (!) 159/77 (!) 163/82 (!) 168/88 (!) 168/74  Pulse: 68 70 65 67  Resp: 16 14 14 15   SpO2: 99% 97% 98% 98%  Weight:      Height:       Eyes: PERRL, lids and conjunctivae normal ENMT: Mucous membranes are moist. Posterior pharynx clear of any exudate or lesions..  Neck: normal, supple, no masses, no thyromegaly Respiratory: clear  to auscultation bilaterally, no wheezing, no crackles. Normal respiratory effort. No accessory muscle use.  Cardiovascular: Regular rate and rhythm, no murmurs / rubs / gallops. No extremity edema. 2+ pedal pulses. No carotid bruits.  Abdomen: no tenderness, no masses palpated. No hepatosplenomegaly. Bowel sounds positive.  Musculoskeletal: no clubbing / cyanosis. No joint deformity upper and lower extremities. Good ROM, no contractures. Normal muscle tone.  Skin: no rashes, lesions, ulcers. No induration Neurologic: Full neuro exam limited by patient's baseline mental status,on my exam no remarkably significant  difference in strenght in his extremities  Psychiatric: Alert and oriented to person and place, but not to time or president.  Labs on Admission: I have personally reviewed following labs and imaging studies  CBC: Recent Labs  Lab 02/25/18 1436  WBC 9.1  NEUTROABS 6.8  HGB 12.3*  HCT 41.6  MCV 96.3  PLT 941   Basic Metabolic Panel: Recent Labs  Lab 02/25/18 1436  NA 138  K 4.8  CL 105  CO2 26  GLUCOSE 258*  BUN 27*  CREATININE 1.72*  CALCIUM 8.7*   Liver Function Tests: Recent Labs  Lab 02/25/18 1436  AST 17  ALT 15  ALKPHOS 159*  BILITOT 0.5  PROT 7.3  ALBUMIN 3.1*   CBG: Recent Labs  Lab 02/25/18 1431  GLUCAP 236*   Urine analysis:    Component Value Date/Time   COLORURINE AMBER (A) 02/25/2018 1436   APPEARANCEUR TURBID (A) 02/25/2018 1436   LABSPEC 1.012 02/25/2018 1436   PHURINE 6.0 02/25/2018 1436   GLUCOSEU NEGATIVE 02/25/2018 1436   HGBUR MODERATE (A) 02/25/2018 1436   BILIRUBINUR NEGATIVE 02/25/2018 1436   KETONESUR NEGATIVE 02/25/2018 1436   PROTEINUR 100 (A) 02/25/2018 1436   UROBILINOGEN 0.2 03/21/2014 1840   NITRITE NEGATIVE 02/25/2018 1436   LEUKOCYTESUR LARGE (A) 02/25/2018 1436    Radiological Exams on Admission: Dg Chest 2 View  Result Date: 02/25/2018 CLINICAL DATA:  Syncopal episode EXAM: CHEST - 2 VIEW COMPARISON:   10/08/2017 FINDINGS: No acute consolidation or effusion. Cardiomediastinal silhouette is normal. No pneumothorax. Degenerative changes of the spine. IMPRESSION: No active cardiopulmonary disease. Electronically Signed   By: Donavan Foil M.D.   On: 02/25/2018 20:09    EKG: Independently reviewed. Sinus rhythm.  Normal intervals.  QTc 446.  No change from prior.  Assessment/Plan Active Problems:   Syncope  Syncope- ? Etiology.  Possibly UTI contributing, but of itself alone should not cause syncope. - EEG -Head CT -Orthostatic vitals- negative -Two-view chest x-ray negative for acute abnormality. - Trops x 3 - ECHO - Bilat Carotid dopplers - Mag- 1.5  UTI-Symptomatic.  Afebrile.  WBC 9.1.  UA large leukocytes few bacteria. -Follow-up  urine culture - Cont 1 g ceftriaxone daily started in ED.  Mild Hypomagnesemia-1.5. K- 4.8.  - replete - Mag in a.m  Vascular dementia, CVA with right hemiparesis-alert and oriented to person and place.  -Continue home Namenda.  HTN-elevated.  -  Continue home lisinopril and metoprolol.  CKD- creatinine 1.7.  Creatinine tends to fluctuate between 1.3-1.7. -Hydrate -BMP a.m.  DVT prophylaxis: Lovenox Code Status: Full Family Communication: None at bedside Disposition Plan: ~2 days Consults called: None Admission status: Obs, tele   Bethena Roys MD [facility]  02/25/2018, 11:10 PM

## 2018-02-25 NOTE — ED Notes (Signed)
Pt c/o of a sore on his lt elbow that will not heal and that is has been there for a long time and that his dog always licks it and would like a doctor to look at it. Will pass on to floor nurse.

## 2018-02-25 NOTE — Progress Notes (Signed)
Unable to obtain standing BP. Pt states he is unable to stand. He uses power wheelchair at home to move about.

## 2018-02-26 ENCOUNTER — Observation Stay (HOSPITAL_BASED_OUTPATIENT_CLINIC_OR_DEPARTMENT_OTHER): Payer: Medicare Other

## 2018-02-26 ENCOUNTER — Observation Stay (HOSPITAL_COMMUNITY): Payer: Medicare Other

## 2018-02-26 ENCOUNTER — Observation Stay (HOSPITAL_COMMUNITY)
Admit: 2018-02-26 | Discharge: 2018-02-26 | Disposition: A | Discharge status: Home / Self Care | Payer: Medicare Other | Attending: Internal Medicine | Admitting: Internal Medicine

## 2018-02-26 DIAGNOSIS — I129 Hypertensive chronic kidney disease with stage 1 through stage 4 chronic kidney disease, or unspecified chronic kidney disease: Secondary | ICD-10-CM | POA: Diagnosis present

## 2018-02-26 DIAGNOSIS — E785 Hyperlipidemia, unspecified: Secondary | ICD-10-CM | POA: Diagnosis present

## 2018-02-26 DIAGNOSIS — G8190 Hemiplegia, unspecified affecting unspecified side: Secondary | ICD-10-CM | POA: Diagnosis not present

## 2018-02-26 DIAGNOSIS — N400 Enlarged prostate without lower urinary tract symptoms: Secondary | ICD-10-CM | POA: Diagnosis not present

## 2018-02-26 DIAGNOSIS — E860 Dehydration: Secondary | ICD-10-CM | POA: Diagnosis present

## 2018-02-26 DIAGNOSIS — N390 Urinary tract infection, site not specified: Secondary | ICD-10-CM

## 2018-02-26 DIAGNOSIS — F329 Major depressive disorder, single episode, unspecified: Secondary | ICD-10-CM | POA: Diagnosis present

## 2018-02-26 DIAGNOSIS — Z87891 Personal history of nicotine dependence: Secondary | ICD-10-CM | POA: Diagnosis not present

## 2018-02-26 DIAGNOSIS — I69351 Hemiplegia and hemiparesis following cerebral infarction affecting right dominant side: Secondary | ICD-10-CM | POA: Diagnosis not present

## 2018-02-26 DIAGNOSIS — R151 Fecal smearing: Secondary | ICD-10-CM | POA: Diagnosis present

## 2018-02-26 DIAGNOSIS — Z79899 Other long term (current) drug therapy: Secondary | ICD-10-CM | POA: Diagnosis not present

## 2018-02-26 DIAGNOSIS — F0150 Vascular dementia without behavioral disturbance: Secondary | ICD-10-CM | POA: Diagnosis not present

## 2018-02-26 DIAGNOSIS — R55 Syncope and collapse: Secondary | ICD-10-CM

## 2018-02-26 DIAGNOSIS — I6523 Occlusion and stenosis of bilateral carotid arteries: Secondary | ICD-10-CM | POA: Diagnosis not present

## 2018-02-26 DIAGNOSIS — N183 Chronic kidney disease, stage 3 (moderate): Secondary | ICD-10-CM | POA: Diagnosis not present

## 2018-02-26 DIAGNOSIS — I10 Essential (primary) hypertension: Secondary | ICD-10-CM | POA: Diagnosis not present

## 2018-02-26 DIAGNOSIS — E119 Type 2 diabetes mellitus without complications: Secondary | ICD-10-CM | POA: Diagnosis not present

## 2018-02-26 DIAGNOSIS — B9689 Other specified bacterial agents as the cause of diseases classified elsewhere: Secondary | ICD-10-CM | POA: Diagnosis present

## 2018-02-26 DIAGNOSIS — E1122 Type 2 diabetes mellitus with diabetic chronic kidney disease: Secondary | ICD-10-CM | POA: Diagnosis present

## 2018-02-26 DIAGNOSIS — R32 Unspecified urinary incontinence: Secondary | ICD-10-CM | POA: Diagnosis present

## 2018-02-26 DIAGNOSIS — N179 Acute kidney failure, unspecified: Secondary | ICD-10-CM | POA: Diagnosis present

## 2018-02-26 HISTORY — DX: Urinary tract infection, site not specified: N39.0

## 2018-02-26 LAB — GLUCOSE, CAPILLARY: GLUCOSE-CAPILLARY: 231 mg/dL — AB (ref 70–99)

## 2018-02-26 LAB — BASIC METABOLIC PANEL
ANION GAP: 9 (ref 5–15)
BUN: 24 mg/dL — ABNORMAL HIGH (ref 8–23)
CO2: 22 mmol/L (ref 22–32)
Calcium: 8.2 mg/dL — ABNORMAL LOW (ref 8.9–10.3)
Chloride: 107 mmol/L (ref 98–111)
Creatinine, Ser: 1.39 mg/dL — ABNORMAL HIGH (ref 0.61–1.24)
GFR calc Af Amer: 52 mL/min — ABNORMAL LOW (ref >60)
GFR calc non Af Amer: 45 mL/min — ABNORMAL LOW (ref >60)
Glucose, Bld: 177 mg/dL — ABNORMAL HIGH (ref 70–99)
POTASSIUM: 3.9 mmol/L (ref 3.5–5.1)
Sodium: 138 mmol/L (ref 135–145)

## 2018-02-26 LAB — TROPONIN I
Troponin I: <0.03 ng/mL (ref <0.03)
Troponin I: 0.03 ng/mL (ref <0.03)

## 2018-02-26 LAB — ECHOCARDIOGRAM COMPLETE
Height: 71 in
Weight: 3432 oz

## 2018-02-26 LAB — MAGNESIUM: Magnesium: 1.9 mg/dL (ref 1.7–2.4)

## 2018-02-26 MED ORDER — INSULIN ASPART 100 UNIT/ML ~~LOC~~ SOLN
0.0000 [IU] | Freq: Every day | SUBCUTANEOUS | Status: DC
  Administered 2018-02-26: 2 [IU] via SUBCUTANEOUS

## 2018-02-26 MED ORDER — INSULIN ASPART 100 UNIT/ML ~~LOC~~ SOLN
0.0000 [IU] | Freq: Three times a day (TID) | SUBCUTANEOUS | Status: DC
  Administered 2018-02-27: 2 [IU] via SUBCUTANEOUS
  Administered 2018-02-27: 5 [IU] via SUBCUTANEOUS
  Administered 2018-02-28: 3 [IU] via SUBCUTANEOUS

## 2018-02-26 NOTE — Progress Notes (Signed)
EEG completed, results pending. 

## 2018-02-26 NOTE — Progress Notes (Signed)
PROGRESS NOTE    Mike Briggs  DPO:[id number] DOB: 03-Sep-1928 DOA: 02/25/2018 PCP: Patient, No Pcp Per    Brief Narrative:  85 year old male with a history of diabetes, chronic kidney disease stage III, hypertension, vascular dementia, presents to the hospital with loss of consciousness.  Patient was found to be clinically dehydrated and had evidence of possible UTI.  He was admitted for further work-up of syncope as well as antibiotics for urinary tract infection.   Assessment & Plan:   Active Problems:   Chronic kidney disease, stage III (moderate) (HCC)   Vascular dementia (HCC)   Essential hypertension   BPH (benign prostatic hyperplasia)   Syncope   UTI (urinary tract infection)   1. Syncope.  I suspect this is related to dehydration.  EEG, echocardiogram, carotid Dopplers were found to be unremarkable.  He is received IV fluids and overall creatinine has improved.  He has not had any further recurrence of symptoms.  Continue to monitor. 2. Urinary tract infection.  Possibly contributing to #1.  He is currently on ceftriaxone.  Follow-up urine culture. 3. Chronic kidney disease stage III.  Patient did have a bump in creatinine on admission.  This improved with IV fluids and is back to baseline. 4. Diabetes.  Does not appear he is on any medications as an outpatient.  Start on sliding cell insulin and check A1c. 5. Vascular dementia.  Continue on Namenda. 6. Hypertension.  Continue lisinopril and metoprolol.  Blood pressures currently stable. 7. Hyperlipidemia.  Continue statin 8. BPH.  Continue on Flomax.   DVT prophylaxis: Lovenox Code Status: Full code Family Communication: No family present Disposition Plan: Possible discharge home in a.m. if continues to improve   Consultants:     Procedures:  Echo: 1. The left ventricle appears to be normal in size, has moderate wall thickness 55-60% ejection fraction Spectral Doppler shows impaired relaxation pattern of  diastolic filling.  2. Right ventricular systolic pressure is is mildly elevated.  3. The right ventricle has normal size and normal systolic function.  4. Normal left atrial size.  5. Normal right atrial size.  6. Mild mitral annular calcification.  7. Mitral valve regurgitation is trivial by color flow Doppler.  8. The mitral valve normal in structure and function.  9. Normal tricuspid valve. 10. Aortic valve has an indeterminant number of cusps. 11. Aortic annular calcification present. Unclear if any degree of valvular stenosis. 12. The aortic root is normal is size and structure.  13. No atrial level shunt detected by color flow Doppler  EEG: Normal electroencephalogram, awake and asleep. There are no focal lateralizing or epileptiform features  Antimicrobials:   Ceftriaxone 1/28 >   Subjective: Patient does not recall the events that led to his hospitalization.  Denies any chest pain or shortness of breath at this time.  Objective: Vitals:   02/25/18 2136 02/25/18 2224 02/26/18 0510 02/26/18 1443  BP: (!) 207/85 94/60 124/89 (!) 107/48  Pulse: 90  (!) 113 86  Resp: 18  20 16   Temp: 97.6 F (36.4 C)  98.9 F (37.2 C)   TempSrc: Oral  Oral   SpO2: 92%  100% 95%  Weight:      Height:        Intake/Output Summary (Last 24 hours) at 02/26/2018 1902 Last data filed at 02/26/2018 1500 Gross per 24 hour  Intake 1396.67 ml  Output -  Net 1396.67 ml   Filed Weights   02/25/18 1406  Weight: 97.3 kg  Examination:  General exam: Appears calm and comfortable  Respiratory system: Clear to auscultation. Respiratory effort normal. Cardiovascular system: S1 & S2 heard, RRR. No JVD, murmurs, rubs, gallops or clicks. No pedal edema. Gastrointestinal system: Abdomen is nondistended, soft and nontender. No organomegaly or masses felt. Normal bowel sounds heard. Central nervous system: Alert and oriented. No focal neurological deficits. Extremities: Symmetric 5 x 5  power. Skin: No rashes, lesions or ulcers Psychiatry: Judgement and insight appear normal. Mood & affect appropriate.     Data Reviewed: I have personally reviewed following labs and imaging studies  CBC: Recent Labs  Lab 02/25/18 1436  WBC 9.1  NEUTROABS 6.8  HGB 12.3*  HCT 41.6  MCV 96.3  PLT 425   Basic Metabolic Panel: Recent Labs  Lab 02/25/18 1436 02/25/18 1936 02/26/18 0133  NA 138  --  138  K 4.8  --  3.9  CL 105  --  107  CO2 26  --  22  GLUCOSE 258*  --  177*  BUN 27*  --  24*  CREATININE 1.72*  --  1.39*  CALCIUM 8.7*  --  8.2*  MG  --  1.5* 1.9   GFR: Estimated Creatinine Clearance: 44.5 mL/min (A) (by C-G formula based on SCr of 1.39 mg/dL (H)). Liver Function Tests: Recent Labs  Lab 02/25/18 1436  AST 17  ALT 15  ALKPHOS 159*  BILITOT 0.5  PROT 7.3  ALBUMIN 3.1*   No results for input(s): LIPASE, AMYLASE in the last 168 hours. No results for input(s): AMMONIA in the last 168 hours. Coagulation Profile: No results for input(s): INR, PROTIME in the last 168 hours. Cardiac Enzymes: Recent Labs  Lab 02/25/18 1936 02/26/18 0133 02/26/18 0811  TROPONINI <0.03 <0.03 0.03*   BNP (last 3 results) No results for input(s): PROBNP in the last 8760 hours. HbA1C: No results for input(s): HGBA1C in the last 72 hours. CBG: Recent Labs  Lab 02/25/18 1431  GLUCAP 236*   Lipid Profile: No results for input(s): CHOL, HDL, LDLCALC, TRIG, CHOLHDL, LDLDIRECT in the last 72 hours. Thyroid Function Tests: No results for input(s): TSH, T4TOTAL, FREET4, T3FREE, THYROIDAB in the last 72 hours. Anemia Panel: No results for input(s): VITAMINB12, FOLATE, FERRITIN, TIBC, IRON, RETICCTPCT in the last 72 hours. Sepsis Labs: No results for input(s): PROCALCITON, LATICACIDVEN in the last 168 hours.  No results found for this or any previous visit (from the past 240 hour(s)).       Radiology Studies: Dg Chest 2 View  Result Date: 02/25/2018 CLINICAL  DATA:  Syncopal episode EXAM: CHEST - 2 VIEW COMPARISON:  10/08/2017 FINDINGS: No acute consolidation or effusion. Cardiomediastinal silhouette is normal. No pneumothorax. Degenerative changes of the spine. IMPRESSION: No active cardiopulmonary disease. Electronically Signed   By: Donavan Foil M.D.   On: 02/25/2018 20:09   Ct Head Wo Contrast  Result Date: 02/25/2018 CLINICAL DATA:  85 y/o  M; altered mental status. EXAM: CT HEAD WITHOUT CONTRAST TECHNIQUE: Contiguous axial images were obtained from the base of the skull through the vertex without intravenous contrast. COMPARISON:  12/14/2017 CT head. FINDINGS: Brain: No evidence of acute infarction, hemorrhage, hydrocephalus, extra-axial collection or mass lesion/mass effect. Stable chronic microvascular ischemic changes and volume loss of the brain. Vascular: Calcific atherosclerosis of carotid siphons and vertebral arteries. No hyperdense vessel identified. Skull: Normal. Negative for fracture or focal lesion. Sinuses/Orbits: No acute finding. Other: Bilateral intra-ocular lens replacement. IMPRESSION: 1. No acute intracranial abnormality identified. 2. Stable chronic  microvascular ischemic changes and volume loss of the brain. Electronically Signed   By: Kristine Garbe M.D.   On: 02/25/2018 23:32   US Carotid Bilateral  Result Date: 02/26/2018 CLINICAL DATA:  Syncope and altered mental status EXAM: BILATERAL CAROTID DUPLEX ULTRASOUND TECHNIQUE: Pearline Cables scale imaging, color Doppler and duplex ultrasound were performed of bilateral carotid and vertebral arteries in the neck. COMPARISON:  None. FINDINGS: Criteria: Quantification of carotid stenosis is based on velocity parameters that correlate the residual internal carotid diameter with NASCET-based stenosis levels, using the diameter of the distal internal carotid lumen as the denominator for stenosis measurement. The following velocity measurements were obtained: RIGHT ICA: 132 cm/sec CCA: 94  cm/sec SYSTOLIC ICA/CCA RATIO:  1.4 ECA: 134 cm/sec LEFT ICA: 158 cm/sec CCA: 161 cm/sec SYSTOLIC ICA/CCA RATIO:  1.1 ECA: 232 cm/sec RIGHT CAROTID ARTERY: Mild smooth soft plaque in the bulb. There is plaque extending into the internal carotid artery. It is moderately prominent and both soft and calcified. Low resistance internal carotid Doppler pattern is preserved. RIGHT VERTEBRAL ARTERY:  Antegrade. LEFT CAROTID ARTERY: Mild smooth soft plaque in the bulb. Low resistance internal carotid Doppler pattern is preserved. LEFT VERTEBRAL ARTERY:  Antegrade. IMPRESSION: Less than 50% stenosis in the right and left internal carotid arteries. Electronically Signed   By: Marybelle Killings M.D.   On: 02/26/2018 14:27        Scheduled Meds: . atorvastatin  10 mg Oral QPM  . colchicine  0.6 mg Oral q morning - 10a  . enoxaparin (LOVENOX) injection  40 mg Subcutaneous Q24H  . lisinopril  40 mg Oral q morning - 10a  . memantine  10 mg Oral BID  . metoprolol succinate  25 mg Oral q morning - 10a  . tamsulosin  0.4 mg Oral QPC breakfast   Continuous Infusions: . cefTRIAXone (ROCEPHIN)  IV 1 g (02/26/18 1743)     LOS: 0 days    Time spent: 30mins    Kathie Dike, MD [facility]   If 7PM-7AM, please contact night-coverage [email or web address]  02/26/2018, 7:02 PM

## 2018-02-26 NOTE — Progress Notes (Signed)
*  PRELIMINARY RESULTS* Echocardiogram 2D Echocardiogram has been performed.  Mike Briggs 02/26/2018, 2:26 PM

## 2018-02-26 NOTE — Procedures (Signed)
ELECTROENCEPHALOGRAM REPORT   Patient: Mike Briggs       Room #: I502 EEG No. ID: [id number] Age: 85 y.o.        Sex: male Referring Physician: Memon Report Date:  02/26/2018        Interpreting Physician: Alexis Goodell  History: Mike Briggs is an 85 y.o. male with an episode of unconsciousness  Medications:  Lipitor, Rocephin, Prinivil, Namenda, Toprol, Flomax  Conditions of Recording:  This is a 21 channel routine scalp EEG performed with bipolar and monopolar montages arranged in accordance to the international 10/20 system of electrode placement. One channel was dedicated to EKG recording.  The patient is in the awake, drowsy and asleep states.  Description:  The waking background activity consists of a low voltage, symmetrical, fairly well organized, 8 Hz alpha activity, seen from the parieto-occipital and posterior temporal regions.  Low voltage fast activity, poorly organized, is seen anteriorly and is at times superimposed on more posterior regions.  A mixture of theta and alpha rhythms are seen from the central and temporal regions. The patient drowses with slowing to irregular, low voltage theta and beta activity.   The patient goes in to a light sleep with symmetrical sleep spindles, vertex central sharp transients and irregular slow activity. No epileptiform activity is noted.   Hyperventilation and intermittent photic stimulation were not performed.   IMPRESSION: Normal electroencephalogram, awake and asleep. There are no focal lateralizing or epileptiform features.   Alexis Goodell, MD Neurology [phone] 02/26/2018, 10:57 AM

## 2018-02-26 NOTE — Care Management Obs Status (Signed)
[facility] NOTIFICATION   Patient Details  Name: Mike Briggs MRN: [id number] Date of Birth: Feb 24, 1928   Medicare Observation Status Notification Given:  Yes    [name] 02/26/2018, 2:34 PM

## 2018-02-27 LAB — GLUCOSE, CAPILLARY
GLUCOSE-CAPILLARY: 120 mg/dL — AB (ref 70–99)
Glucose-Capillary: 214 mg/dL — ABNORMAL HIGH (ref 70–99)
Glucose-Capillary: 138 mg/dL — ABNORMAL HIGH (ref 70–99)
Glucose-Capillary: 131 mg/dL — ABNORMAL HIGH (ref 70–99)

## 2018-02-27 LAB — HEMOGLOBIN A1C
Hgb A1c MFr Bld: 7.2 % — ABNORMAL HIGH (ref 4.8–5.6)
Mean Plasma Glucose: 159.94 mg/dL

## 2018-02-27 NOTE — Progress Notes (Addendum)
PROGRESS NOTE    Mike Briggs  ZMO:[id number] DOB: 05-25-1928 DOA: 02/25/2018 PCP: Patient, No Pcp Per    Brief Narrative:  85 year old male with a history of diabetes, chronic kidney disease stage III, hypertension, vascular dementia, presents to the hospital with loss of consciousness.  Patient was found to be clinically dehydrated and had evidence of possible UTI.  He was admitted for further work-up of syncope as well as antibiotics for urinary tract infection.   Assessment & Plan:   Active Problems:   Chronic kidney disease, stage III (moderate) (HCC)   Vascular dementia (HCC)   Essential hypertension   BPH (benign prostatic hyperplasia)   Syncope   UTI (urinary tract infection)   1. Syncope.  I suspect this is related to dehydration.  EEG, echocardiogram, carotid Dopplers were found to be unremarkable.  He is received IV fluids and overall creatinine has improved.  He has not had any further recurrence of symptoms.  Continue to monitor. 2. Urinary tract infection.  Possibly contributing to #1.  He is currently on ceftriaxone.  Urine culture shows gram-negative rods.  Continue current treatments and follow-up sensitivities 3. AKI on Chronic kidney disease stage III.  Patient did have a bump in creatinine on admission.  This improved with IV fluids and is back to baseline. 4. Diabetes.  Does not appear he is on any medications as an outpatient.  Currently on sliding scale insulin blood sugars are stable.  A1c is 7.2. 5. Vascular dementia.  Continue on Namenda. 6. Hypertension.  Continue lisinopril and metoprolol.  Blood pressures currently stable. 7. Hyperlipidemia.  Continue statin 8. BPH.  Continue on Flomax.   DVT prophylaxis: Lovenox Code Status: Full code Family Communication: No family present Disposition Plan: Discharge home once urine culture results are available   Consultants:     Procedures:  Echo: 1. The left ventricle appears to be normal in size, has  moderate wall thickness 55-60% ejection fraction Spectral Doppler shows impaired relaxation pattern of diastolic filling.  2. Right ventricular systolic pressure is is mildly elevated.  3. The right ventricle has normal size and normal systolic function.  4. Normal left atrial size.  5. Normal right atrial size.  6. Mild mitral annular calcification.  7. Mitral valve regurgitation is trivial by color flow Doppler.  8. The mitral valve normal in structure and function.  9. Normal tricuspid valve. 10. Aortic valve has an indeterminant number of cusps. 11. Aortic annular calcification present. Unclear if any degree of valvular stenosis. 12. The aortic root is normal is size and structure.  13. No atrial level shunt detected by color flow Doppler  EEG: Normal electroencephalogram, awake and asleep. There are no focal lateralizing or epileptiform features  Antimicrobials:   Ceftriaxone 1/28 >   Subjective: Denies any shortness of breath or chest pain.  No dizziness Objective: Vitals:   02/26/18 1443 02/26/18 2137 02/27/18 0441 02/27/18 1329  BP: (!) 107/48 127/63 112/61 121/70  Pulse: 86 75 82 74  Resp: 16 20 18 18   Temp:  98.3 F (36.8 C) 97.7 F (36.5 C) 98.9 F (37.2 C)  TempSrc:  Oral Oral   SpO2: 95% 100% 97% 96%  Weight:      Height:        Intake/Output Summary (Last 24 hours) at 02/27/2018 1736 Last data filed at 02/27/2018 1727 Gross per 24 hour  Intake 720 ml  Output 1000 ml  Net -280 ml   Filed Weights   02/25/18 1406  Weight: 97.3 kg    Examination:  General exam: Alert, awake, no distress Respiratory system: Clear to auscultation. Respiratory effort normal. Cardiovascular system:RRR. No murmurs, rubs, gallops. Gastrointestinal system: Abdomen is nondistended, soft and nontender. No organomegaly or masses felt. Normal bowel sounds heard. Central nervous system: Alert and oriented. No focal neurological deficits. Extremities: No C/C/E, +pedal  pulses Skin: No rashes, lesions or ulcers Psychiatry: Mild confusion, pleasant.    Data Reviewed: I have personally reviewed following labs and imaging studies  CBC: Recent Labs  Lab 02/25/18 1436  WBC 9.1  NEUTROABS 6.8  HGB 12.3*  HCT 41.6  MCV 96.3  PLT 035   Basic Metabolic Panel: Recent Labs  Lab 02/25/18 1436 02/25/18 1936 02/26/18 0133  NA 138  --  138  K 4.8  --  3.9  CL 105  --  107  CO2 26  --  22  GLUCOSE 258*  --  177*  BUN 27*  --  24*  CREATININE 1.72*  --  1.39*  CALCIUM 8.7*  --  8.2*  MG  --  1.5* 1.9   GFR: Estimated Creatinine Clearance: 44.5 mL/min (A) (by C-G formula based on SCr of 1.39 mg/dL (H)). Liver Function Tests: Recent Labs  Lab 02/25/18 1436  AST 17  ALT 15  ALKPHOS 159*  BILITOT 0.5  PROT 7.3  ALBUMIN 3.1*   No results for input(s): LIPASE, AMYLASE in the last 168 hours. No results for input(s): AMMONIA in the last 168 hours. Coagulation Profile: No results for input(s): INR, PROTIME in the last 168 hours. Cardiac Enzymes: Recent Labs  Lab 02/25/18 1936 02/26/18 0133 02/26/18 0811  TROPONINI <0.03 <0.03 0.03*   BNP (last 3 results) No results for input(s): PROBNP in the last 8760 hours. HbA1C: Recent Labs    02/26/18 1915  HGBA1C 7.2*   CBG: Recent Labs  Lab 02/25/18 1431 02/26/18 2138 02/27/18 0736 02/27/18 1056 02/27/18 1611  GLUCAP 236* 231* 120* 214* 138*   Lipid Profile: No results for input(s): CHOL, HDL, LDLCALC, TRIG, CHOLHDL, LDLDIRECT in the last 72 hours. Thyroid Function Tests: No results for input(s): TSH, T4TOTAL, FREET4, T3FREE, THYROIDAB in the last 72 hours. Anemia Panel: No results for input(s): VITAMINB12, FOLATE, FERRITIN, TIBC, IRON, RETICCTPCT in the last 72 hours. Sepsis Labs: No results for input(s): PROCALCITON, LATICACIDVEN in the last 168 hours.  Recent Results (from the past 240 hour(s))  Urine culture     Status: Abnormal (Preliminary result)   Collection Time: 02/25/18   2:36 PM  Result Value Ref Range Status   Specimen Description   Final    URINE, CATHETERIZED Performed at [facility], [address], [place], [place] [address]    Special Requests   Final    NONE Performed at [facility], [address], [place], [place] [address]    Culture (A)  Final    >=100,000 COLONIES/mL KLEBSIELLA PNEUMONIAE CULTURE REINCUBATED FOR BETTER GROWTH Performed at [facility], [place] [address], [place],  [address]    Report Status PENDING  Incomplete         Radiology Studies: Dg Chest 2 View  Result Date: 02/25/2018 CLINICAL DATA:  Syncopal episode EXAM: CHEST - 2 VIEW COMPARISON:  10/08/2017 FINDINGS: No acute consolidation or effusion. Cardiomediastinal silhouette is normal. No pneumothorax. Degenerative changes of the spine. IMPRESSION: No active cardiopulmonary disease. Electronically Signed   By: Donavan Foil M.D.   On: 02/25/2018 20:09   Ct Head Wo Contrast  Result  Date: 02/25/2018 CLINICAL DATA:  85 y/o  M; altered mental status. EXAM: CT HEAD WITHOUT CONTRAST TECHNIQUE: Contiguous axial images were obtained from the base of the skull through the vertex without intravenous contrast. COMPARISON:  12/14/2017 CT head. FINDINGS: Brain: No evidence of acute infarction, hemorrhage, hydrocephalus, extra-axial collection or mass lesion/mass effect. Stable chronic microvascular ischemic changes and volume loss of the brain. Vascular: Calcific atherosclerosis of carotid siphons and vertebral arteries. No hyperdense vessel identified. Skull: Normal. Negative for fracture or focal lesion. Sinuses/Orbits: No acute finding. Other: Bilateral intra-ocular lens replacement. IMPRESSION: 1. No acute intracranial abnormality identified. 2. Stable chronic microvascular ischemic changes and volume loss of the brain. Electronically Signed   By: Kristine Garbe M.D.   On: 02/25/2018 23:32   US Carotid Bilateral  Result Date: 02/26/2018 CLINICAL DATA:   Syncope and altered mental status EXAM: BILATERAL CAROTID DUPLEX ULTRASOUND TECHNIQUE: Pearline Cables scale imaging, color Doppler and duplex ultrasound were performed of bilateral carotid and vertebral arteries in the neck. COMPARISON:  None. FINDINGS: Criteria: Quantification of carotid stenosis is based on velocity parameters that correlate the residual internal carotid diameter with NASCET-based stenosis levels, using the diameter of the distal internal carotid lumen as the denominator for stenosis measurement. The following velocity measurements were obtained: RIGHT ICA: 132 cm/sec CCA: 94 cm/sec SYSTOLIC ICA/CCA RATIO:  1.4 ECA: 134 cm/sec LEFT ICA: 158 cm/sec CCA: 801 cm/sec SYSTOLIC ICA/CCA RATIO:  1.1 ECA: 232 cm/sec RIGHT CAROTID ARTERY: Mild smooth soft plaque in the bulb. There is plaque extending into the internal carotid artery. It is moderately prominent and both soft and calcified. Low resistance internal carotid Doppler pattern is preserved. RIGHT VERTEBRAL ARTERY:  Antegrade. LEFT CAROTID ARTERY: Mild smooth soft plaque in the bulb. Low resistance internal carotid Doppler pattern is preserved. LEFT VERTEBRAL ARTERY:  Antegrade. IMPRESSION: Less than 50% stenosis in the right and left internal carotid arteries. Electronically Signed   By: Marybelle Killings M.D.   On: 02/26/2018 14:27        Scheduled Meds: . atorvastatin  10 mg Oral QPM  . colchicine  0.6 mg Oral q morning - 10a  . enoxaparin (LOVENOX) injection  40 mg Subcutaneous Q24H  . insulin aspart  0-15 Units Subcutaneous TID WC  . insulin aspart  0-5 Units Subcutaneous QHS  . lisinopril  40 mg Oral q morning - 10a  . memantine  10 mg Oral BID  . metoprolol succinate  25 mg Oral q morning - 10a  . tamsulosin  0.4 mg Oral QPC breakfast   Continuous Infusions: . cefTRIAXone (ROCEPHIN)  IV 1 g (02/27/18 1656)     LOS: 1 day    Time spent: 55mins    Kathie Dike, MD [facility]   If 7PM-7AM, please contact  night-coverage [email or web address]  02/27/2018, 5:36 PM

## 2018-02-28 ENCOUNTER — Encounter (HOSPITAL_COMMUNITY): Payer: Self-pay | Admitting: Family Medicine

## 2018-02-28 DIAGNOSIS — R55 Syncope and collapse: Secondary | ICD-10-CM

## 2018-02-28 DIAGNOSIS — N400 Enlarged prostate without lower urinary tract symptoms: Secondary | ICD-10-CM

## 2018-02-28 DIAGNOSIS — E119 Type 2 diabetes mellitus without complications: Secondary | ICD-10-CM | POA: Diagnosis not present

## 2018-02-28 DIAGNOSIS — N390 Urinary tract infection, site not specified: Principal | ICD-10-CM

## 2018-02-28 DIAGNOSIS — G8190 Hemiplegia, unspecified affecting unspecified side: Secondary | ICD-10-CM | POA: Diagnosis not present

## 2018-02-28 DIAGNOSIS — N183 Chronic kidney disease, stage 3 (moderate): Secondary | ICD-10-CM

## 2018-02-28 DIAGNOSIS — I10 Essential (primary) hypertension: Secondary | ICD-10-CM

## 2018-02-28 DIAGNOSIS — F0150 Vascular dementia without behavioral disturbance: Secondary | ICD-10-CM

## 2018-02-28 LAB — BASIC METABOLIC PANEL
Anion gap: 8 (ref 5–15)
BUN: 28 mg/dL — ABNORMAL HIGH (ref 8–23)
CO2: 21 mmol/L — ABNORMAL LOW (ref 22–32)
Calcium: 8.3 mg/dL — ABNORMAL LOW (ref 8.9–10.3)
Chloride: 108 mmol/L (ref 98–111)
Creatinine, Ser: 1.29 mg/dL — ABNORMAL HIGH (ref 0.61–1.24)
GFR calc Af Amer: 57 mL/min — ABNORMAL LOW (ref >60)
GFR, EST NON AFRICAN AMERICAN: 50 mL/min — AB (ref >60)
Glucose, Bld: 117 mg/dL — ABNORMAL HIGH (ref 70–99)
Potassium: 3.9 mmol/L (ref 3.5–5.1)
Sodium: 137 mmol/L (ref 135–145)

## 2018-02-28 LAB — URINE CULTURE: Culture: >=100000 — AB

## 2018-02-28 LAB — CBC
HCT: 38.6 % — ABNORMAL LOW (ref 39.0–52.0)
Hemoglobin: 11.6 g/dL — ABNORMAL LOW (ref 13.0–17.0)
MCH: 28.9 pg (ref 26.0–34.0)
MCHC: 30.1 g/dL (ref 30.0–36.0)
MCV: 96 fL (ref 80.0–100.0)
Platelets: 176 10*3/uL (ref 150–400)
RBC: 4.02 MIL/uL — ABNORMAL LOW (ref 4.22–5.81)
RDW: 12.8 % (ref 11.5–15.5)
WBC: 11.7 10*3/uL — ABNORMAL HIGH (ref 4.0–10.5)
nRBC: 0 % (ref 0.0–0.2)

## 2018-02-28 LAB — GLUCOSE, CAPILLARY
Glucose-Capillary: 113 mg/dL — ABNORMAL HIGH (ref 70–99)
Glucose-Capillary: 199 mg/dL — ABNORMAL HIGH (ref 70–99)

## 2018-02-28 MED ORDER — TAMSULOSIN HCL 0.4 MG PO CAPS: 0.4000 mg | ORAL_CAPSULE | Freq: Every day | ORAL | 3 refills | Status: DC

## 2018-02-28 MED ORDER — CEPHALEXIN 500 MG PO CAPS: 500.0000 mg | ORAL_CAPSULE | Freq: Two times a day (BID) | ORAL | 0 refills | Status: AC

## 2018-02-28 NOTE — Plan of Care (Signed)
  Problem: Acute Rehab PT Goals(only PT should resolve) Goal: Pt Will Go Supine/Side To Sit Flowsheets (Taken 02/28/2018 1401) Pt will go Supine/Side to Sit: with min guard assist; with minimal assist Goal: Patient Will Perform Sitting Balance Flowsheets (Taken 02/28/2018 1401) Patient will perform sitting balance: with min guard assist Goal: Patient Will Transfer Sit To/From Stand Flowsheets (Taken 02/28/2018 1401) Patient will transfer sit to/from stand: with min guard assist Goal: Pt Will Ambulate Flowsheets (Taken 02/28/2018 1401) Pt will Ambulate: 15 feet; with minimal assist; with rolling walker   2:01 PM, 02/28/18 Lonell Grandchild, MPT Physical Therapist with [facility] [phone] office [phone] mobile phone

## 2018-02-28 NOTE — Care Management Note (Signed)
Case Management Note  Patient Details  Name: Mike Briggs MRN: [id number] Date of Birth: January 21, 1929  Subjective/Objective:   Pt admitted with syncope. Pt from home, lives alone. Has aid during the day and is alone at night. Pt reports he is able to transfer himself into and out of the bed at night. He has electric WC. He can not remember who his PCP is as he recently switched. Pt has no preference of [place] providers. CM has attempted to contact pt's niece to discuss but was unable to contact. Per chart review pt has been referrer to [facility] in the past.               Action/Plan: DC home today with [facility] [place]. Referral given to Cassie, [facility].   Expected Discharge Date:  02/28/18               Expected Discharge Plan:  [place]  In-House Referral:  NA  Discharge planning Services  CM Consult  Post Acute Care Choice:  Home Health Choice offered to:  Patient  HH Arranged:  PT, RN [facility] Agency:  [facility]  Status of Service:  Completed, signed off  Sherald Barge, RN 02/28/2018, 11:54 AM

## 2018-02-28 NOTE — Discharge Summary (Signed)
Physician Discharge Summary  Mike Briggs ZOX:[id number] DOB: [birth year] DOA: 02/25/2018  PCP: [facility].   Admit date: 02/25/2018 Discharge date: 02/28/2018  Admitted From: Home  Disposition: Home   Recommendations for Outpatient Follow-up:  1. Follow up with PCP in 1 weeks  Home Health: PT, RN  Discharge Condition: STABLE   CODE STATUS: FULL    Brief Hospitalization Summary: Please see all hospital notes, images, labs for full details of the hospitalization. HPI: Mike Briggs is a 85 y.o. male with medical history significant DM2, CKD 3, vascular dementia, CVA, HTN, who was brought to the [facility] reports of loss of consciousness.  History is obtained from chart review and EDP, patient is unable to give me history as he cannot remember events of today, he is able to answer yes and no to simple questions.  Patient was sitting playing checkers with his aide, when reportedly patient "went out".  No shaking was noted.  Aide laid patient on the floor and was able to get a carotid pulse.  On EMS arrival patient was alert and sitting up.  Patient was incontinent of urine. To me patient denies chest pain or difficulty breathing at this time.  He admits to burning with urination.  Denies vomiting or loose stools.  ED Course: Elevated blood pressure 150s to 200s.  Mild and chronically elevated  ALP- 159.  Creatinine 1.7, reports creatinine tends to fluctuate between 1.6-1.7. EKG without significant change from prior.  UA--large leukocytes, few bacteria.  Sent was given 1 g ceftriaxone.  Hospitalist to admit for UTI and syncope.  Brief Narrative:  85 year old male with a history of diabetes, chronic kidney disease stage III, hypertension, vascular dementia, presents to the hospital with loss of consciousness.  Patient was found to be clinically dehydrated and had evidence of possible UTI.  He was admitted for further work-up of syncope as well as antibiotics for urinary tract  infection.   Assessment & Plan:   Active Problems:   Chronic kidney disease, stage III (moderate) (HCC)   Vascular dementia (HCC)   Essential hypertension   BPH (benign prostatic hyperplasia)   Syncope   UTI (urinary tract infection)   1. Syncope.  I suspect this is related to dehydration.  EEG, echocardiogram, carotid Dopplers were found to be unremarkable.  He is received IV fluids and overall creatinine has improved.  He has not had any further recurrence of symptoms.  Home health PT, RN ordered.   2. Klebsiella Urinary tract infection.  Possibly contributing to #1.  He was treated with IV ceftriaxone.  Urine culture shows gram-negative rods.  Continue current treatments and follow-up sensitivities.  Pharmacist recommending cephalexin which he was discharged home on.  I have asked him to follow up with PCP in 1 week for recheck.   3. AKI on Chronic kidney disease stage III.  Patient did have a bump in creatinine on admission.  This improved with IV fluids and is back to baseline.  He has an outpatient renal ultrasound ordered by Dr. Theador Hawthorne.  Follow up with him in 1 week recommended.  4. Diet controlled type 2 diabetes mellitus.  Does not appear he is on any medications as an outpatient.  Was on sliding scale insulin blood sugars in the hospital.   A1c is 7.2 which is acceptable given his advanced age and that he lives alone.  Follow up with PCP to recheck.  5. Vascular dementia.  Continue on Namenda. 6. Hypertension.  Continue lisinopril and  metoprolol.  Blood pressures currently stable. 7. Hyperlipidemia.  Continue statin 8. BPH.  Continue on Flomax.  He has outpatient renal US ordered by Dr. Theador Hawthorne.   DVT prophylaxis: Lovenox Code Status: Full code Family Communication: No family present Disposition Plan: Discharge home  Consultants:     Procedures:  Echo: 1. The left ventricle appears to be normal in size, has moderate wall thickness 55-60% ejection fraction  Spectral Doppler shows impaired relaxation pattern of diastolic filling. 2. Right ventricular systolic pressure is is mildly elevated. 3. The right ventricle has normal size and normal systolic function. 4. Normal left atrial size. 5. Normal right atrial size. 6. Mild mitral annular calcification. 7. Mitral valve regurgitation is trivial by color flow Doppler. 8. The mitral valve normal in structure and function. 9. Normal tricuspid valve. 10. Aortic valve has an indeterminant number of cusps. 11. Aortic annular calcification present. Unclear if any degree of valvular stenosis. 12. The aortic root is normal is size and structure. 13. No atrial level shunt detected by color flow Doppler  EEG: Normal electroencephalogram, awake andasleep. There are no focal lateralizing or epileptiform features  Antimicrobials:   Ceftriaxone 1/28 >1/31   Discharge Diagnoses:  Active Problems:   Chronic kidney disease, stage III (moderate) (HCC)   Vascular dementia (HCC)   Essential hypertension   BPH (benign prostatic hyperplasia)   Syncope   UTI (urinary tract infection)  Discharge Instructions: Discharge Instructions    Call MD for:  difficulty breathing, headache or visual disturbances   Complete by:  As directed    Call MD for:  extreme fatigue   Complete by:  As directed    Call MD for:  persistant dizziness or light-headedness   Complete by:  As directed    Call MD for:  persistant nausea and vomiting   Complete by:  As directed    Call MD for:  severe uncontrolled pain   Complete by:  As directed    Increase activity slowly   Complete by:  As directed      Allergies as of 02/28/2018   No Known Allergies     Medication List    TAKE these medications   atorvastatin 10 MG tablet Commonly known as:  LIPITOR TAKE 1 TABLET BY MOUTH AFTER SUPPER. What changed:  See the new instructions.   cephALEXin 500 MG capsule Commonly known as:  KEFLEX Take 1 capsule (500 mg  total) by mouth 2 (two) times daily for 3 days.   colchicine 0.6 MG tablet Take 1 tablet (0.6 mg total) by mouth daily. What changed:  when to take this   lisinopril 40 MG tablet Commonly known as:  PRINIVIL,ZESTRIL TAKE (1) TABLET BY MOUTH ONCE DAILY. What changed:  See the new instructions.   memantine 10 MG tablet Commonly known as:  NAMENDA Take 10 mg by mouth 2 (two) times daily.   metoprolol succinate 25 MG 24 hr tablet Commonly known as:  TOPROL-XL TAKE (1) TABLET BY MOUTH ONCE DAILY. What changed:  See the new instructions.   tamsulosin 0.4 MG Caps capsule Commonly known as:  FLOMAX Take 1 capsule (0.4 mg total) by mouth daily after breakfast. What changed:  when to take this      Follow-up Information    Primary Care Provider / Physician. Schedule an appointment as soon as possible for a visit in 1 week(s).   Why:  Hospital Follow Up       Liana Gerold, MD. Schedule an appointment as  soon as possible for a visit in 1 week(s).   Specialty:  Nephrology Why:  Hospital Follow Up  Contact information: [address] [address] [phone]          No Known Allergies Allergies as of 02/28/2018   No Known Allergies     Medication List    TAKE these medications   atorvastatin 10 MG tablet Commonly known as:  LIPITOR TAKE 1 TABLET BY MOUTH AFTER SUPPER. What changed:  See the new instructions.   cephALEXin 500 MG capsule Commonly known as:  KEFLEX Take 1 capsule (500 mg total) by mouth 2 (two) times daily for 3 days.   colchicine 0.6 MG tablet Take 1 tablet (0.6 mg total) by mouth daily. What changed:  when to take this   lisinopril 40 MG tablet Commonly known as:  PRINIVIL,ZESTRIL TAKE (1) TABLET BY MOUTH ONCE DAILY. What changed:  See the new instructions.   memantine 10 MG tablet Commonly known as:  NAMENDA Take 10 mg by mouth 2 (two) times daily.   metoprolol succinate 25 MG 24 hr tablet Commonly known as:   TOPROL-XL TAKE (1) TABLET BY MOUTH ONCE DAILY. What changed:  See the new instructions.   tamsulosin 0.4 MG Caps capsule Commonly known as:  FLOMAX Take 1 capsule (0.4 mg total) by mouth daily after breakfast. What changed:  when to take this       Procedures/Studies: Dg Chest 2 View  Result Date: 02/25/2018 CLINICAL DATA:  Syncopal episode EXAM: CHEST - 2 VIEW COMPARISON:  10/08/2017 FINDINGS: No acute consolidation or effusion. Cardiomediastinal silhouette is normal. No pneumothorax. Degenerative changes of the spine. IMPRESSION: No active cardiopulmonary disease. Electronically Signed   By: Donavan Foil M.D.   On: 02/25/2018 20:09   Ct Head Wo Contrast  Result Date: 02/25/2018 CLINICAL DATA:  85 y/o  M; altered mental status. EXAM: CT HEAD WITHOUT CONTRAST TECHNIQUE: Contiguous axial images were obtained from the base of the skull through the vertex without intravenous contrast. COMPARISON:  12/14/2017 CT head. FINDINGS: Brain: No evidence of acute infarction, hemorrhage, hydrocephalus, extra-axial collection or mass lesion/mass effect. Stable chronic microvascular ischemic changes and volume loss of the brain. Vascular: Calcific atherosclerosis of carotid siphons and vertebral arteries. No hyperdense vessel identified. Skull: Normal. Negative for fracture or focal lesion. Sinuses/Orbits: No acute finding. Other: Bilateral intra-ocular lens replacement. IMPRESSION: 1. No acute intracranial abnormality identified. 2. Stable chronic microvascular ischemic changes and volume loss of the brain. Electronically Signed   By: Kristine Garbe M.D.   On: 02/25/2018 23:32   US Carotid Bilateral  Result Date: 02/26/2018 CLINICAL DATA:  Syncope and altered mental status EXAM: BILATERAL CAROTID DUPLEX ULTRASOUND TECHNIQUE: Pearline Cables scale imaging, color Doppler and duplex ultrasound were performed of bilateral carotid and vertebral arteries in the neck. COMPARISON:  None. FINDINGS: Criteria:  Quantification of carotid stenosis is based on velocity parameters that correlate the residual internal carotid diameter with NASCET-based stenosis levels, using the diameter of the distal internal carotid lumen as the denominator for stenosis measurement. The following velocity measurements were obtained: RIGHT ICA: 132 cm/sec CCA: 94 cm/sec SYSTOLIC ICA/CCA RATIO:  1.4 ECA: 134 cm/sec LEFT ICA: 158 cm/sec CCA: 701 cm/sec SYSTOLIC ICA/CCA RATIO:  1.1 ECA: 232 cm/sec RIGHT CAROTID ARTERY: Mild smooth soft plaque in the bulb. There is plaque extending into the internal carotid artery. It is moderately prominent and both soft and calcified. Low resistance internal carotid Doppler pattern is preserved. RIGHT VERTEBRAL ARTERY:  Antegrade. LEFT CAROTID ARTERY: Mild smooth soft plaque in the bulb. Low resistance internal carotid Doppler pattern is preserved. LEFT VERTEBRAL ARTERY:  Antegrade. IMPRESSION: Less than 50% stenosis in the right and left internal carotid arteries. Electronically Signed   By: Marybelle Killings M.D.   On: 02/26/2018 14:27      Subjective: Pt without complaints, says that he feels better and feels that he can manage well at home.     Discharge Exam: Vitals:   02/27/18 2118 02/28/18 0557  BP: (!) 154/72 130/74  Pulse: 71 81  Resp: 18 16  Temp: 98.5 F (36.9 C) 98.2 F (36.8 C)  SpO2: 100% 96%   Vitals:   02/27/18 0441 02/27/18 1329 02/27/18 2118 02/28/18 0557  BP: 112/61 121/70 (!) 154/72 130/74  Pulse: 82 74 71 81  Resp: 18 18 18 16   Temp: 97.7 F (36.5 C) 98.9 F (37.2 C) 98.5 F (36.9 C) 98.2 F (36.8 C)  TempSrc: Oral  Oral Oral  SpO2: 97% 96% 100% 96%  Weight:      Height:       General exam: Alert, awake, no distress Respiratory system: Clear to auscultation. Respiratory effort normal. Cardiovascular system:normal s1,s2 sounds.  No murmurs, rubs, gallops. Gastrointestinal system: Abdomen is nondistended, soft and nontender. No organomegaly or masses felt. Normal  bowel sounds heard. Central nervous system: Alert and oriented. No focal neurological deficits. Extremities: No C/C/E, +pedal pulses Skin: No rashes, lesions or ulcers Psychiatry: alert and oriented x3,  pleasant.   The results of significant diagnostics from this hospitalization (including imaging, microbiology, ancillary and laboratory) are listed below for reference.     Microbiology: Recent Results (from the past 240 hour(s))  Urine culture     Status: Abnormal   Collection Time: 02/25/18  2:36 PM  Result Value Ref Range Status   Specimen Description   Final    URINE, CATHETERIZED Performed at [facility], [address] [place], [place] [address]    Special Requests   Final    NONE Performed at [facility], [address], [place], [place] [address]    Culture >=100,000 COLONIES/mL KLEBSIELLA PNEUMONIAE (A)  Final   Report Status 02/28/2018 FINAL  Final   Organism ID, Bacteria KLEBSIELLA PNEUMONIAE (A)  Final      Susceptibility   Klebsiella pneumoniae - MIC*    AMPICILLIN >=32 RESISTANT Resistant     CEFAZOLIN <=4 SENSITIVE Sensitive     CEFTRIAXONE <=1 SENSITIVE Sensitive     CIPROFLOXACIN <=0.25 SENSITIVE Sensitive     GENTAMICIN <=1 SENSITIVE Sensitive     IMIPENEM 0.5 SENSITIVE Sensitive     NITROFURANTOIN 64 INTERMEDIATE Intermediate     TRIMETH/SULFA <=20 SENSITIVE Sensitive     AMPICILLIN/SULBACTAM 16 INTERMEDIATE Intermediate     PIP/TAZO <=4 SENSITIVE Sensitive     Extended ESBL NEGATIVE Sensitive     * >=100,000 COLONIES/mL KLEBSIELLA PNEUMONIAE     Labs: BNP (last 3 results) No results for input(s): BNP in the last 8760 hours. Basic Metabolic Panel: Recent Labs  Lab 02/25/18 1436 02/25/18 1936 02/26/18 0133 02/28/18 0447  NA 138  --  138 137  K 4.8  --  3.9 3.9  CL 105  --  107 108  CO2 26  --  22 21*  GLUCOSE 258*  --  177* 117*  BUN 27*  --  24* 28*  CREATININE 1.72*  --  1.39* 1.29*  CALCIUM 8.7*  --  8.2* 8.3*  MG  --  1.5* 1.9  --     Liver Function Tests: Recent Labs  Lab 02/25/18 1436  AST 17  ALT 15  ALKPHOS 159*  BILITOT 0.5  PROT 7.3  ALBUMIN 3.1*   No results for input(s): LIPASE, AMYLASE in the last 168 hours. No results for input(s): AMMONIA in the last 168 hours. CBC: Recent Labs  Lab 02/25/18 1436 02/28/18 0447  WBC 9.1 11.7*  NEUTROABS 6.8  --   HGB 12.3* 11.6*  HCT 41.6 38.6*  MCV 96.3 96.0  PLT 232 176   Cardiac Enzymes: Recent Labs  Lab 02/25/18 1936 02/26/18 0133 02/26/18 0811  TROPONINI <0.03 <0.03 0.03*   BNP: Invalid input(s): POCBNP CBG: Recent Labs  Lab 02/27/18 0736 02/27/18 1056 02/27/18 1611 02/27/18 2104 02/28/18 0725  GLUCAP 120* 214* 138* 131* 113*   D-Dimer No results for input(s): DDIMER in the last 72 hours. Hgb A1c Recent Labs    02/26/18 1915  HGBA1C 7.2*   Lipid Profile No results for input(s): CHOL, HDL, LDLCALC, TRIG, CHOLHDL, LDLDIRECT in the last 72 hours. Thyroid function studies No results for input(s): TSH, T4TOTAL, T3FREE, THYROIDAB in the last 72 hours.  Invalid input(s): FREET3 Anemia work up No results for input(s): VITAMINB12, FOLATE, FERRITIN, TIBC, IRON, RETICCTPCT in the last 72 hours. Urinalysis    Component Value Date/Time   COLORURINE AMBER (A) 02/25/2018 1436   APPEARANCEUR TURBID (A) 02/25/2018 1436   LABSPEC 1.012 02/25/2018 1436   PHURINE 6.0 02/25/2018 1436   GLUCOSEU NEGATIVE 02/25/2018 1436   HGBUR MODERATE (A) 02/25/2018 1436   BILIRUBINUR NEGATIVE 02/25/2018 1436   KETONESUR NEGATIVE 02/25/2018 1436   PROTEINUR 100 (A) 02/25/2018 1436   UROBILINOGEN 0.2 03/21/2014 1840   NITRITE NEGATIVE 02/25/2018 1436   LEUKOCYTESUR LARGE (A) 02/25/2018 1436   Sepsis Labs Invalid input(s): PROCALCITONIN,  WBC,  LACTICIDVEN Microbiology Recent Results (from the past 240 hour(s))  Urine culture     Status: Abnormal   Collection Time: 02/25/18  2:36 PM  Result Value Ref Range Status   Specimen Description   Final     URINE, CATHETERIZED Performed at [facility], [address], [place], [place] [address]    Special Requests   Final    NONE Performed at [facility], [address] [place],  [address]    Culture >=100,000 COLONIES/mL KLEBSIELLA PNEUMONIAE (A)  Final   Report Status 02/28/2018 FINAL  Final   Organism ID, Bacteria KLEBSIELLA PNEUMONIAE (A)  Final      Susceptibility   Klebsiella pneumoniae - MIC*    AMPICILLIN >=32 RESISTANT Resistant     CEFAZOLIN <=4 SENSITIVE Sensitive     CEFTRIAXONE <=1 SENSITIVE Sensitive     CIPROFLOXACIN <=0.25 SENSITIVE Sensitive     GENTAMICIN <=1 SENSITIVE Sensitive     IMIPENEM 0.5 SENSITIVE Sensitive     NITROFURANTOIN 64 INTERMEDIATE Intermediate     TRIMETH/SULFA <=20 SENSITIVE Sensitive     AMPICILLIN/SULBACTAM 16 INTERMEDIATE Intermediate     PIP/TAZO <=4 SENSITIVE Sensitive     Extended ESBL NEGATIVE Sensitive     * >=100,000 COLONIES/mL KLEBSIELLA PNEUMONIAE   Time coordinating discharge: 32 minutes   SIGNED:  Irwin Brakeman, MD  [facility] 02/28/2018, 10:29 AM

## 2018-02-28 NOTE — Evaluation (Signed)
Physical Therapy Evaluation Patient Details Name: Mike Briggs MRN: [id number] DOB: 12-17-1928 Today's Date: 02/28/2018   History of Present Illness  Mike Briggs is a 85 y.o. male with medical history significant DM2, CKD 3, vascular dementia, CVA, HTN, who was brought to the [facility] ED reports of loss of consciousness.  History is obtained from chart review and EDP, patient is unable to give me history as he cannot remember events of today, he is able to answer yes and no to simple questions.  Patient was sitting playing checkers with his aide, when reportedly patient "went out".  No shaking was noted.  Aide laid patient on the floor and was able to get a carotid pulse.  On EMS arrival patient was alert and sitting up.  Patient was incontinent of urine.    Clinical Impression  Patient functioning near baseline for functional mobility and gait, demonstrates slow labored movement for sitting up at bedside and during transfer to chair, limited mostly due to c/o RLE weakness and fatigue.  Patient tolerated sitting up in chair after therapy.  Patient will benefit from continued physical therapy in hospital and recommended venue below to increase strength, balance, endurance for safe ADLs and gait.     Follow Up Recommendations Home health PT;Supervision - Intermittent;Supervision for mobility/OOB    Equipment Recommendations  None recommended by PT    Recommendations for Other Services       Precautions / Restrictions Precautions Precautions: Fall Restrictions Weight Bearing Restrictions: No      Mobility  Bed Mobility Overal bed mobility: Needs Assistance Bed Mobility: Supine to Sit     Supine to sit: Min assist     General bed mobility comments: slow labored movement with increased time due to wanting to avoid dizziness, normally uses overhead trapeze at home  Transfers Overall transfer level: Needs assistance Equipment used: Rolling walker (2 wheeled) Transfers: Sit  to/from [facility] Sit to Stand: Min assist Stand pivot transfers: Min assist;Mod assist       General transfer comment: slow labored movement  Ambulation/Gait Ambulation/Gait assistance: Min assist;Mod assist Gait Distance (Feet): 4 Feet Assistive device: Rolling walker (2 wheeled) Gait Pattern/deviations: Decreased step length - right;Decreased step length - left;Decreased stride length Gait velocity: slow   General Gait Details: limited to 4-5 slow labored steps due to fatigue during transfer to chair  Stairs            Wheelchair Mobility    Modified Rankin (Stroke Patients Only)       Balance Overall balance assessment: Needs assistance Sitting-balance support: Feet supported;No upper extremity supported Sitting balance-Leahy Scale: Good     Standing balance support: Bilateral upper extremity supported;During functional activity Standing balance-Leahy Scale: Fair Standing balance comment: using RW                             Pertinent Vitals/Pain Pain Assessment: No/denies pain    Home Living Family/patient expects to be discharged to:: Private residence Living Arrangements: Alone Available Help at Discharge: Personal care attendant;Available 24 hours/day Type of Home: Apartment Home Access: Ramped entrance     Home Layout: One level Home Equipment: Walker - 2 wheels;Walker - 4 wheels;Wheelchair - power;Shower seat;Bedside commode      Prior Function Level of Independence: Needs assistance   Gait / Transfers Assistance Needed: short distanced household ambulator with RW and assistance, transfers independently to power w/c and from w/c to commode  ADL's / Homemaking Assistance Needed: has 3 home aides that are availble up to 4 hours daily or PRN x 7 days/week and available at night PRN        Hand Dominance        Extremity/Trunk Assessment   Upper Extremity Assessment Upper Extremity Assessment: Generalized  weakness    Lower Extremity Assessment Lower Extremity Assessment: Generalized weakness;RLE deficits/detail;LLE deficits/detail RLE Deficits / Details: grossly 3+/5 LLE Deficits / Details: grossly 4/5    Cervical / Trunk Assessment Cervical / Trunk Assessment: Kyphotic  Communication   Communication: No difficulties  Cognition Arousal/Alertness: Awake/alert Behavior During Therapy: WFL for tasks assessed/performed Overall Cognitive Status: Within Functional Limits for tasks assessed                                        General Comments      Exercises     Assessment/Plan    PT Assessment Patient needs continued PT services  PT Problem List Decreased strength;Decreased activity tolerance;Decreased balance;Decreased mobility       PT Treatment Interventions Gait training;Stair training;Functional mobility training;Therapeutic activities;Patient/family education;Therapeutic exercise    PT Goals (Current goals can be found in the Care Plan section)  Acute Rehab PT Goals Patient Stated Goal: return home with home aides to assist PT Goal Formulation: With patient Time For Goal Achievement: 03/07/18 Potential to Achieve Goals: Good    Frequency Min 3X/week   Barriers to discharge        Co-evaluation               AM-PAC PT "6 Clicks" Mobility  Outcome Measure Help needed turning from your back to your side while in a flat bed without using bedrails?: A Little Help needed moving from lying on your back to sitting on the side of a flat bed without using bedrails?: A Lot Help needed moving to and from a bed to a chair (including a wheelchair)?: A Lot Help needed standing up from a chair using your arms (e.g., wheelchair or bedside chair)?: A Lot Help needed to walk in hospital room?: A Lot Help needed climbing 3-5 steps with a railing? : A Lot 6 Click Score: 13    End of Session   Activity Tolerance: Patient tolerated treatment well;Patient  limited by fatigue Patient left: in chair;with call bell/phone within reach Nurse Communication: Mobility status PT Visit Diagnosis: Unsteadiness on feet (R26.81);Other abnormalities of gait and mobility (R26.89);Muscle weakness (generalized) (M62.81)    Time: 9509-3267 PT Time Calculation (min) (ACUTE ONLY): 24 min   Charges:   PT Evaluation $PT Eval Moderate Complexity: 1 Mod          1:59 PM, 02/28/18 Lonell Grandchild, MPT Physical Therapist with [facility] 336 [phone] office [phone] mobile phone

## 2018-02-28 NOTE — Care Management Important Message (Signed)
Important Message  Patient Details  Name: Mike Briggs MRN: [id number] Date of Birth: [birth year]   Medicare Important Message Given:  Yes    Shelda Altes 02/28/2018, 1:35 PM

## 2018-02-28 NOTE — Discharge Instructions (Signed)
IMPORTANT INFORMATION: PAY CLOSE ATTENTION   PHYSICIAN DISCHARGE INSTRUCTIONS  Follow with Primary care provider  Patient, No Pcp Per  and other consultants as instructed your Hospitalist Physician  [facility] IF SYMPTOMS COME BACK, WORSEN OR NEW PROBLEM DEVELOPS.   Please note: You were cared for by a hospitalist during your hospital stay. Every effort will be made to forward records to your primary care provider.  You can request that your primary care provider send for your hospital records if they have not received them.  Once you are discharged, your primary care physician will handle any further medical issues. Please note that NO REFILLS for any discharge medications will be authorized once you are discharged, as it is imperative that you return to your primary care physician (or establish a relationship with a primary care physician if you do not have one) for your post hospital discharge needs so that they can reassess your need for medications and monitor your lab values.  Please get a complete blood count and chemistry panel checked by your Primary MD at your next visit, and again as instructed by your Primary MD.  Get Medicines reviewed and adjusted: Please take all your medications with you for your next visit with your Primary MD  Laboratory/radiological data: Please request your Primary MD to go over all hospital tests and procedure/radiological results at the follow up, please ask your primary care provider to get all Hospital records sent to his/her office.  In some cases, they will be blood work, cultures and biopsy results pending at the time of your discharge. Please request that your primary care provider follow up on these results.  If you are diabetic, please bring your blood sugar readings with you to your follow up appointment with primary care.    Please call and make your follow up appointments as soon as possible.    Also Note  the following: If you experience worsening of your admission symptoms, develop shortness of breath, life threatening emergency, suicidal or homicidal thoughts you must seek medical attention immediately by calling 911 or calling your MD immediately  if symptoms less severe.  You must read complete instructions/literature along with all the possible adverse reactions/side effects for all the Medicines you take and that have been prescribed to you. Take any new Medicines after you have completely understood and accpet all the possible adverse reactions/side effects.   Do not drive when taking Pain medications or sleeping medications (Benzodiazepines)  Do not take more than prescribed Pain, Sleep and Anxiety Medications. It is not advisable to combine anxiety,sleep and pain medications without talking with your primary care practitioner  Special Instructions: If you have smoked or chewed Tobacco  in the last 2 yrs please stop smoking, stop any regular Alcohol  and or any Recreational drug use.  Wear Seat belts while driving.     Antibiotic Medicine, Adult  Antibiotic medicines treat infections caused by a type of germ called bacteria. They work by killing the bacteria that make you sick. When do I need to take antibiotics? You often need these medicines to treat bacterial infections, such as:  A urinary tract infection (UTI).  Strep throat.  Meningitis. This affects the spinal cord and brain.  A bad lung infection. You may start the medicines while your doctor waits for tests to come back. When the tests come back, your doctor may change or stop your medicine. When are antibiotics not needed? You do not  need these medicines for most common illnesses, such as:  A cold.  The flu.  A sore throat. Antibiotics are not always needed for all infections caused by bacteria. Do not ask for these medicines, or take them, when they are not needed. What are the risks of taking  antibiotics? Most antibiotics can cause an infection called Clostridioides difficile (C. diff).This causes watery poop (diarrhea). Let your doctor know right away if:  You have watery poop while taking an antibiotic.  You have watery poop after you stop taking an antibiotic. The illness can happen weeks after you stop the medicine. You also have a risk of getting an infection in the future that antibiotics cannot treat (antibiotic-resistant infection). This type of infection can be dangerous. What else should I know about taking antibiotics?   You need to take the entire prescription. ? Take the medicine for as long as told by your doctor. ? Do not stop taking it even if you start to feel better.  Try not to miss any doses. If you miss a dose, call your doctor.  Birth control pills may not work. If you take birth control pills: ? Keep on taking them. ? Use a second form of birth control, such as a condom. Do this for as long as told by your doctor.  Ask your doctor: ? How long to wait in between doses. ? If you should take the medicine with food. ? If there is anything you should stay away from while taking the antibiotic, such as: ? Food. ? Drinks. ? Medicines. ? If there are any side effects you should watch for.  Only take the medicines that your doctor told you to take. Do not take medicines that were given to someone else.  Drink a large glass of water with the medicine.  Ask the pharmacist for a tool to measure the medicine, such as: ? A syringe. ? A cup. ? A spoon.  Throw away any extra medicine. Contact a doctor if:  You get worse.  You have new joint pain or muscle aches after starting the medicine.  You have side effects from the medicine, such as: ? Stomach pain. ? Watery poop. ? Feeling sick to your stomach (nausea). Get help right away if:  You have signs of a very bad allergic reaction. If this happens, stop taking the medicine right away. Signs may  include: ? Hives. These are raised, itchy, red bumps on the skin. ? Skin rash. ? Trouble breathing. ? Wheezing. ? Swelling. ? Feeling dizzy. ? Throwing up (vomiting).  Your pee (urine) is dark, or is the color of blood.  Your skin turns yellow.  You bruise easily.  You bleed easily.  You have very bad watery poop and cramps in your belly.  You have a very bad headache. Summary  Antibiotics are often used to treat infections caused by bacteria.  Only take these medicines when needed.  Let your doctor know if you have watery poop while taking an antibiotic.  You need to take the entire prescription. This information is not intended to replace advice given to you by your health care provider. Make sure you discuss any questions you have with your health care provider. Document Released: 10/25/2007 Document Revised: 07/16/2017 Document Reviewed: 01/18/2016 Elsevier Interactive Patient Education  2019 [employer].   Near-Syncope Near-syncope is when you suddenly get weak or dizzy, or you feel like you might pass out (faint). This is due to a lack of blood  flow to the brain. During an episode of near-syncope, you may:  Feel dizzy or light-headed.  Feel sick to your stomach (nauseous).  See all white or all black.  Have cold, clammy skin. This condition is caused by a sudden decrease in blood flow to the brain. This decrease can result from various causes, but most of those causes are not dangerous. However, near-syncope may be a sign of a serious medical problem, so it is important to seek medical care. Follow these instructions at home: Pay attention to any changes in your symptoms. Take these actions to help with your condition:  Have someone stay with you until you feel stable.  Talk with your doctor about your symptoms. You may need to have testing to understand the cause of your near-syncope.  Do not drive, use machinery, or play sports until your doctor says it  is okay.  Keep all follow-up visits as told by your doctor. This is important.  If you start to feel like you might pass out, lie down right away and raise (elevate) your feet above the level of your heart. Breathe deeply and steadily. Wait until all of the symptoms are gone.  Drink enough fluid to keep your pee (urine) pale yellow. Medicines  If you are taking blood pressure or heart medicine, get up slowly and spend many minutes getting ready to sit and then stand. This can help with dizziness.  Take over-the-counter and prescription medicines only as told by your doctor. Get help right away if you:  Have a seizure.  Have pain in your: ? Chest. ? Tummy, ? Back.  Faint.  Have a bad headache.  Are bleeding from your mouth or butt.  Have black or tarry poop (stool).  Have a very fast or uneven heartbeat (palpitations).  Are confused.  Have trouble walking.  Are very weak.  Have trouble seeing. These symptoms may represent a serious problem that is an emergency. Do not wait to see if your symptoms will go away. Get medical help right away. Call your local emergency services (911 in the [place] Do not drive yourself to the hospital. Summary  Near-syncope is when you suddenly get weak or dizzy, or you feel like you might pass out.  This condition is caused by a lack of blood flow to the brain.  Near-syncope may be a sign of a serious medical problem, so it is important to seek medical care. This information is not intended to replace advice given to you by your health care provider. Make sure you discuss any questions you have with your health care provider. Document Released: 07/04/2007 Document Revised: 09/10/2017 Document Reviewed: 09/29/2014 Elsevier Interactive Patient Education  2019 [place].   Urinary Tract Infection, Adult A urinary tract infection (UTI) is an infection of any part of the urinary tract. The urinary tract includes:  The kidneys.  The  ureters.  The bladder.  The urethra. These organs make, store, and get rid of pee (urine) in the body. What are the causes? This is caused by germs (bacteria) in your genital area. These germs grow and cause swelling (inflammation) of your urinary tract. What increases the risk? You are more likely to develop this condition if:  You have a small, thin tube (catheter) to drain pee.  You cannot control when you pee or poop (incontinence).  You are male, and: ? You use these methods to prevent pregnancy: ? A medicine that kills sperm (spermicide). ? A device that blocks sperm (  diaphragm). ? You have low levels of a male hormone (estrogen). ? You are pregnant.  You have genes that add to your risk.  You are sexually active.  You take antibiotic medicines.  You have trouble peeing because of: ? A prostate that is bigger than normal, if you are male. ? A blockage in the part of your body that drains pee from the bladder (urethra). ? A kidney stone. ? A nerve condition that affects your bladder (neurogenic bladder). ? Not getting enough to drink. ? Not peeing often enough.  You have other conditions, such as: ? Diabetes. ? A weak disease-fighting system (immune system). ? Sickle cell disease. ? Gout. ? Injury of the spine. What are the signs or symptoms? Symptoms of this condition include:  Needing to pee right away (urgently).  Peeing often.  Peeing small amounts often.  Pain or burning when peeing.  Blood in the pee.  Pee that smells bad or not like normal.  Trouble peeing.  Pee that is cloudy.  Fluid coming from the vagina, if you are male.  Pain in the belly or lower back. Other symptoms include:  Throwing up (vomiting).  No urge to eat.  Feeling mixed up (confused).  Being tired and grouchy (irritable).  A fever.  Watery poop (diarrhea). How is this treated? This condition may be treated with:  Antibiotic medicine.  Other  medicines.  Drinking enough water. Follow these instructions at home:  Medicines  Take over-the-counter and prescription medicines only as told by your doctor.  If you were prescribed an antibiotic medicine, take it as told by your doctor. Do not stop taking it even if you start to feel better. General instructions  Make sure you: ? Pee until your bladder is empty. ? Do not hold pee for a long time. ? Empty your bladder after sex. ? Wipe from front to back after pooping if you are a male. Use each tissue one time when you wipe.  Drink enough fluid to keep your pee pale yellow.  Keep all follow-up visits as told by your doctor. This is important. Contact a doctor if:  You do not get better after 1-2 days.  Your symptoms go away and then come back. Get help right away if:  You have very bad back pain.  You have very bad pain in your lower belly.  You have a fever.  You are sick to your stomach (nauseous).  You are throwing up. Summary  A urinary tract infection (UTI) is an infection of any part of the urinary tract.  This condition is caused by germs in your genital area.  There are many risk factors for a UTI. These include having a small, thin tube to drain pee and not being able to control when you pee or poop.  Treatment includes antibiotic medicines for germs.  Drink enough fluid to keep your pee pale yellow. This information is not intended to replace advice given to you by your health care provider. Make sure you discuss any questions you have with your health care provider. Document Released: 07/04/2007 Document Revised: 07/25/2017 Document Reviewed: 07/25/2017 Elsevier Interactive Patient Education  2019 [place] in the Home, Adult Falls can cause injuries. They can happen to people of all ages. There are many things you can do to make your home safe and to help prevent falls. Ask for help when making these changes, if  needed. What actions can I take to prevent falls? General  Instructions  Use good lighting in all rooms. Replace any light bulbs that burn out.  Turn on the lights when you go into a dark area. Use night-lights.  Keep items that you use often in easy-to-reach places. Lower the shelves around your home if necessary.  Set up your furniture so you have a clear path. Avoid moving your furniture around.  Do not have throw rugs and other things on the floor that can make you trip.  Avoid walking on wet floors.  If any of your floors are uneven, fix them.  Add color or contrast paint or tape to clearly mark and help you see: ? Any grab bars or handrails. ? First and last steps of stairways. ? Where the edge of each step is.  If you use a stepladder: ? Make sure that it is fully opened. Do not climb a closed stepladder. ? Make sure that both sides of the stepladder are locked into place. ? Ask someone to hold the stepladder for you while you use it.  If there are any pets around you, be aware of where they are. What can I do in the bathroom?      Keep the floor dry. Clean up any water that spills onto the floor as soon as it happens.  Remove soap buildup in the tub or shower regularly.  Use non-skid mats or decals on the floor of the tub or shower.  Attach bath mats securely with double-sided, non-slip rug tape.  If you need to sit down in the shower, use a plastic, non-slip stool.  Install grab bars by the toilet and in the tub and shower. Do not use towel bars as grab bars. What can I do in the bedroom?  Make sure that you have a light by your bed that is easy to reach.  Do not use any sheets or blankets that are too big for your bed. They should not hang down onto the floor.  Have a firm chair that has side arms. You can use this for support while you get dressed. What can I do in the kitchen?  Clean up any spills right away.  If you need to reach something above  you, use a strong step stool that has a grab bar.  Keep electrical cords out of the way.  Do not use floor polish or wax that makes floors slippery. If you must use wax, use non-skid floor wax. What can I do with my stairs?  Do not leave any items on the stairs.  Make sure that you have a light switch at the top of the stairs and the bottom of the stairs. If you do not have them, ask someone to add them for you.  Make sure that there are handrails on both sides of the stairs, and use them. Fix handrails that are broken or loose. Make sure that handrails are as long as the stairways.  Install non-slip stair treads on all stairs in your home.  Avoid having throw rugs at the top or bottom of the stairs. If you do have throw rugs, attach them to the floor with carpet tape.  Choose a carpet that does not hide the edge of the steps on the stairway.  Check any carpeting to make sure that it is firmly attached to the stairs. Fix any carpet that is loose or worn. What can I do on the outside of my home?  Use bright outdoor lighting.  Regularly fix the edges  of walkways and driveways and fix any cracks.  Remove anything that might make you trip as you walk through a door, such as a raised step or threshold.  Trim any bushes or trees on the path to your home.  Regularly check to see if handrails are loose or broken. Make sure that both sides of any steps have handrails.  Install guardrails along the edges of any raised decks and porches.  Clear walking paths of anything that might make someone trip, such as tools or rocks.  Have any leaves, snow, or ice cleared regularly.  Use sand or salt on walking paths during winter.  Clean up any spills in your garage right away. This includes grease or oil spills. What other actions can I take?  Wear shoes that: ? Have a low heel. Do not wear high heels. ? Have rubber bottoms. ? Are comfortable and fit you well. ? Are closed at the toe. Do  not wear open-toe sandals.  Use tools that help you move around (mobility aids) if they are needed. These include: ? Canes. ? Walkers. ? Scooters. ? Crutches.  Review your medicines with your doctor. Some medicines can make you feel dizzy. This can increase your chance of falling. Ask your doctor what other things you can do to help prevent falls. Where to find more information  Centers for Disease Control and Prevention, [employer]: https://garcia.biz/  [employer] on Aging: BrainJudge.co.uk Contact a doctor if:  You are afraid of falling at home.  You feel weak, drowsy, or dizzy at home.  You fall at home. Summary  There are many simple things that you can do to make your home safe and to help prevent falls.  Ways to make your home safe include removing tripping hazards and installing grab bars in the bathroom.  Ask for help when making these changes in your home. This information is not intended to replace advice given to you by your health care provider. Make sure you discuss any questions you have with your health care provider. Document Released: 11/11/2008 Document Revised: 08/30/2016 Document Reviewed: 08/30/2016 Elsevier Interactive Patient Education  2019 [employer].

## 2018-03-03 ENCOUNTER — Ambulatory Visit (HOSPITAL_COMMUNITY): Payer: Medicare Other | Attending: Nephrology

## 2018-03-03 ENCOUNTER — Ambulatory Visit (HOSPITAL_COMMUNITY): Admission: RE | Admit: 2018-03-03 | Payer: Medicare Other | Source: Ambulatory Visit

## 2018-03-03 ENCOUNTER — Encounter (HOSPITAL_COMMUNITY): Payer: Self-pay

## 2018-03-05 DIAGNOSIS — N390 Urinary tract infection, site not specified: Secondary | ICD-10-CM | POA: Diagnosis not present

## 2018-03-05 DIAGNOSIS — E1122 Type 2 diabetes mellitus with diabetic chronic kidney disease: Secondary | ICD-10-CM | POA: Diagnosis not present

## 2018-03-05 DIAGNOSIS — I69351 Hemiplegia and hemiparesis following cerebral infarction affecting right dominant side: Secondary | ICD-10-CM | POA: Diagnosis not present

## 2018-03-05 DIAGNOSIS — Z87891 Personal history of nicotine dependence: Secondary | ICD-10-CM | POA: Diagnosis not present

## 2018-03-05 DIAGNOSIS — N183 Chronic kidney disease, stage 3 (moderate): Secondary | ICD-10-CM | POA: Diagnosis not present

## 2018-03-05 DIAGNOSIS — M130 Polyarthritis, unspecified: Secondary | ICD-10-CM | POA: Diagnosis not present

## 2018-03-05 DIAGNOSIS — Z602 Problems related to living alone: Secondary | ICD-10-CM | POA: Diagnosis not present

## 2018-03-05 DIAGNOSIS — B961 Klebsiella pneumoniae [K. pneumoniae] as the cause of diseases classified elsewhere: Secondary | ICD-10-CM | POA: Diagnosis not present

## 2018-03-05 DIAGNOSIS — I129 Hypertensive chronic kidney disease with stage 1 through stage 4 chronic kidney disease, or unspecified chronic kidney disease: Secondary | ICD-10-CM | POA: Diagnosis not present

## 2018-03-05 DIAGNOSIS — N400 Enlarged prostate without lower urinary tract symptoms: Secondary | ICD-10-CM | POA: Diagnosis not present

## 2018-03-05 DIAGNOSIS — F0150 Vascular dementia without behavioral disturbance: Secondary | ICD-10-CM | POA: Diagnosis not present

## 2018-03-05 DIAGNOSIS — F329 Major depressive disorder, single episode, unspecified: Secondary | ICD-10-CM | POA: Diagnosis not present

## 2018-03-06 DIAGNOSIS — N390 Urinary tract infection, site not specified: Secondary | ICD-10-CM | POA: Diagnosis not present

## 2018-03-06 DIAGNOSIS — I129 Hypertensive chronic kidney disease with stage 1 through stage 4 chronic kidney disease, or unspecified chronic kidney disease: Secondary | ICD-10-CM | POA: Diagnosis not present

## 2018-03-06 DIAGNOSIS — F329 Major depressive disorder, single episode, unspecified: Secondary | ICD-10-CM | POA: Diagnosis not present

## 2018-03-06 DIAGNOSIS — F0150 Vascular dementia without behavioral disturbance: Secondary | ICD-10-CM | POA: Diagnosis not present

## 2018-03-06 DIAGNOSIS — I69351 Hemiplegia and hemiparesis following cerebral infarction affecting right dominant side: Secondary | ICD-10-CM | POA: Diagnosis not present

## 2018-03-06 DIAGNOSIS — B961 Klebsiella pneumoniae [K. pneumoniae] as the cause of diseases classified elsewhere: Secondary | ICD-10-CM | POA: Diagnosis not present

## 2018-03-10 DIAGNOSIS — D509 Iron deficiency anemia, unspecified: Secondary | ICD-10-CM | POA: Diagnosis not present

## 2018-03-10 DIAGNOSIS — R55 Syncope and collapse: Secondary | ICD-10-CM | POA: Diagnosis not present

## 2018-03-10 DIAGNOSIS — Z1159 Encounter for screening for other viral diseases: Secondary | ICD-10-CM | POA: Diagnosis not present

## 2018-03-10 DIAGNOSIS — E559 Vitamin D deficiency, unspecified: Secondary | ICD-10-CM | POA: Diagnosis not present

## 2018-03-10 DIAGNOSIS — N183 Chronic kidney disease, stage 3 (moderate): Secondary | ICD-10-CM | POA: Diagnosis not present

## 2018-03-10 DIAGNOSIS — R809 Proteinuria, unspecified: Secondary | ICD-10-CM | POA: Diagnosis not present

## 2018-03-10 DIAGNOSIS — N390 Urinary tract infection, site not specified: Secondary | ICD-10-CM | POA: Diagnosis not present

## 2018-03-10 DIAGNOSIS — G8190 Hemiplegia, unspecified affecting unspecified side: Secondary | ICD-10-CM | POA: Diagnosis not present

## 2018-03-10 DIAGNOSIS — D519 Vitamin B12 deficiency anemia, unspecified: Secondary | ICD-10-CM | POA: Diagnosis not present

## 2018-03-10 DIAGNOSIS — I10 Essential (primary) hypertension: Secondary | ICD-10-CM | POA: Diagnosis not present

## 2018-03-10 DIAGNOSIS — Z79899 Other long term (current) drug therapy: Secondary | ICD-10-CM | POA: Diagnosis not present

## 2018-03-11 DIAGNOSIS — F329 Major depressive disorder, single episode, unspecified: Secondary | ICD-10-CM | POA: Diagnosis not present

## 2018-03-11 DIAGNOSIS — B961 Klebsiella pneumoniae [K. pneumoniae] as the cause of diseases classified elsewhere: Secondary | ICD-10-CM | POA: Diagnosis not present

## 2018-03-11 DIAGNOSIS — I69351 Hemiplegia and hemiparesis following cerebral infarction affecting right dominant side: Secondary | ICD-10-CM | POA: Diagnosis not present

## 2018-03-11 DIAGNOSIS — F0150 Vascular dementia without behavioral disturbance: Secondary | ICD-10-CM | POA: Diagnosis not present

## 2018-03-11 DIAGNOSIS — I129 Hypertensive chronic kidney disease with stage 1 through stage 4 chronic kidney disease, or unspecified chronic kidney disease: Secondary | ICD-10-CM | POA: Diagnosis not present

## 2018-03-11 DIAGNOSIS — N390 Urinary tract infection, site not specified: Secondary | ICD-10-CM | POA: Diagnosis not present

## 2018-03-13 DIAGNOSIS — F329 Major depressive disorder, single episode, unspecified: Secondary | ICD-10-CM | POA: Diagnosis not present

## 2018-03-13 DIAGNOSIS — B961 Klebsiella pneumoniae [K. pneumoniae] as the cause of diseases classified elsewhere: Secondary | ICD-10-CM | POA: Diagnosis not present

## 2018-03-13 DIAGNOSIS — F0150 Vascular dementia without behavioral disturbance: Secondary | ICD-10-CM | POA: Diagnosis not present

## 2018-03-13 DIAGNOSIS — I129 Hypertensive chronic kidney disease with stage 1 through stage 4 chronic kidney disease, or unspecified chronic kidney disease: Secondary | ICD-10-CM | POA: Diagnosis not present

## 2018-03-13 DIAGNOSIS — N390 Urinary tract infection, site not specified: Secondary | ICD-10-CM | POA: Diagnosis not present

## 2018-03-13 DIAGNOSIS — I69351 Hemiplegia and hemiparesis following cerebral infarction affecting right dominant side: Secondary | ICD-10-CM | POA: Diagnosis not present

## 2018-03-16 DIAGNOSIS — F0150 Vascular dementia without behavioral disturbance: Secondary | ICD-10-CM | POA: Diagnosis not present

## 2018-03-16 DIAGNOSIS — F329 Major depressive disorder, single episode, unspecified: Secondary | ICD-10-CM | POA: Diagnosis not present

## 2018-03-16 DIAGNOSIS — B961 Klebsiella pneumoniae [K. pneumoniae] as the cause of diseases classified elsewhere: Secondary | ICD-10-CM | POA: Diagnosis not present

## 2018-03-16 DIAGNOSIS — N390 Urinary tract infection, site not specified: Secondary | ICD-10-CM | POA: Diagnosis not present

## 2018-03-16 DIAGNOSIS — I69351 Hemiplegia and hemiparesis following cerebral infarction affecting right dominant side: Secondary | ICD-10-CM | POA: Diagnosis not present

## 2018-03-16 DIAGNOSIS — I129 Hypertensive chronic kidney disease with stage 1 through stage 4 chronic kidney disease, or unspecified chronic kidney disease: Secondary | ICD-10-CM | POA: Diagnosis not present

## 2018-03-19 DIAGNOSIS — I129 Hypertensive chronic kidney disease with stage 1 through stage 4 chronic kidney disease, or unspecified chronic kidney disease: Secondary | ICD-10-CM | POA: Diagnosis not present

## 2018-03-19 DIAGNOSIS — I69351 Hemiplegia and hemiparesis following cerebral infarction affecting right dominant side: Secondary | ICD-10-CM | POA: Diagnosis not present

## 2018-03-19 DIAGNOSIS — B961 Klebsiella pneumoniae [K. pneumoniae] as the cause of diseases classified elsewhere: Secondary | ICD-10-CM | POA: Diagnosis not present

## 2018-03-19 DIAGNOSIS — F329 Major depressive disorder, single episode, unspecified: Secondary | ICD-10-CM | POA: Diagnosis not present

## 2018-03-19 DIAGNOSIS — F0150 Vascular dementia without behavioral disturbance: Secondary | ICD-10-CM | POA: Diagnosis not present

## 2018-03-19 DIAGNOSIS — N390 Urinary tract infection, site not specified: Secondary | ICD-10-CM | POA: Diagnosis not present

## 2018-03-21 DIAGNOSIS — F0150 Vascular dementia without behavioral disturbance: Secondary | ICD-10-CM | POA: Diagnosis not present

## 2018-03-21 DIAGNOSIS — N390 Urinary tract infection, site not specified: Secondary | ICD-10-CM | POA: Diagnosis not present

## 2018-03-21 DIAGNOSIS — B961 Klebsiella pneumoniae [K. pneumoniae] as the cause of diseases classified elsewhere: Secondary | ICD-10-CM | POA: Diagnosis not present

## 2018-03-21 DIAGNOSIS — I69351 Hemiplegia and hemiparesis following cerebral infarction affecting right dominant side: Secondary | ICD-10-CM | POA: Diagnosis not present

## 2018-03-21 DIAGNOSIS — I129 Hypertensive chronic kidney disease with stage 1 through stage 4 chronic kidney disease, or unspecified chronic kidney disease: Secondary | ICD-10-CM | POA: Diagnosis not present

## 2018-03-21 DIAGNOSIS — F329 Major depressive disorder, single episode, unspecified: Secondary | ICD-10-CM | POA: Diagnosis not present

## 2018-03-24 DIAGNOSIS — I69351 Hemiplegia and hemiparesis following cerebral infarction affecting right dominant side: Secondary | ICD-10-CM | POA: Diagnosis not present

## 2018-03-24 DIAGNOSIS — F329 Major depressive disorder, single episode, unspecified: Secondary | ICD-10-CM | POA: Diagnosis not present

## 2018-03-24 DIAGNOSIS — B961 Klebsiella pneumoniae [K. pneumoniae] as the cause of diseases classified elsewhere: Secondary | ICD-10-CM | POA: Diagnosis not present

## 2018-03-24 DIAGNOSIS — F0150 Vascular dementia without behavioral disturbance: Secondary | ICD-10-CM | POA: Diagnosis not present

## 2018-03-24 DIAGNOSIS — N390 Urinary tract infection, site not specified: Secondary | ICD-10-CM | POA: Diagnosis not present

## 2018-03-24 DIAGNOSIS — I129 Hypertensive chronic kidney disease with stage 1 through stage 4 chronic kidney disease, or unspecified chronic kidney disease: Secondary | ICD-10-CM | POA: Diagnosis not present

## 2018-03-25 DIAGNOSIS — B961 Klebsiella pneumoniae [K. pneumoniae] as the cause of diseases classified elsewhere: Secondary | ICD-10-CM | POA: Diagnosis not present

## 2018-03-26 DIAGNOSIS — F329 Major depressive disorder, single episode, unspecified: Secondary | ICD-10-CM | POA: Diagnosis not present

## 2018-03-26 DIAGNOSIS — F0150 Vascular dementia without behavioral disturbance: Secondary | ICD-10-CM | POA: Diagnosis not present

## 2018-03-26 DIAGNOSIS — I69351 Hemiplegia and hemiparesis following cerebral infarction affecting right dominant side: Secondary | ICD-10-CM | POA: Diagnosis not present

## 2018-03-26 DIAGNOSIS — I129 Hypertensive chronic kidney disease with stage 1 through stage 4 chronic kidney disease, or unspecified chronic kidney disease: Secondary | ICD-10-CM | POA: Diagnosis not present

## 2018-03-26 DIAGNOSIS — B961 Klebsiella pneumoniae [K. pneumoniae] as the cause of diseases classified elsewhere: Secondary | ICD-10-CM | POA: Diagnosis not present

## 2018-03-26 DIAGNOSIS — N390 Urinary tract infection, site not specified: Secondary | ICD-10-CM | POA: Diagnosis not present

## 2018-03-27 DIAGNOSIS — N390 Urinary tract infection, site not specified: Secondary | ICD-10-CM | POA: Diagnosis not present

## 2018-03-27 DIAGNOSIS — I69351 Hemiplegia and hemiparesis following cerebral infarction affecting right dominant side: Secondary | ICD-10-CM | POA: Diagnosis not present

## 2018-03-27 DIAGNOSIS — B961 Klebsiella pneumoniae [K. pneumoniae] as the cause of diseases classified elsewhere: Secondary | ICD-10-CM | POA: Diagnosis not present

## 2018-03-27 DIAGNOSIS — I129 Hypertensive chronic kidney disease with stage 1 through stage 4 chronic kidney disease, or unspecified chronic kidney disease: Secondary | ICD-10-CM | POA: Diagnosis not present

## 2018-03-27 DIAGNOSIS — F0150 Vascular dementia without behavioral disturbance: Secondary | ICD-10-CM | POA: Diagnosis not present

## 2018-03-27 DIAGNOSIS — F329 Major depressive disorder, single episode, unspecified: Secondary | ICD-10-CM | POA: Diagnosis not present

## 2018-03-31 DIAGNOSIS — F0150 Vascular dementia without behavioral disturbance: Secondary | ICD-10-CM | POA: Diagnosis not present

## 2018-03-31 DIAGNOSIS — F329 Major depressive disorder, single episode, unspecified: Secondary | ICD-10-CM | POA: Diagnosis not present

## 2018-03-31 DIAGNOSIS — I69351 Hemiplegia and hemiparesis following cerebral infarction affecting right dominant side: Secondary | ICD-10-CM | POA: Diagnosis not present

## 2018-03-31 DIAGNOSIS — N390 Urinary tract infection, site not specified: Secondary | ICD-10-CM | POA: Diagnosis not present

## 2018-03-31 DIAGNOSIS — I129 Hypertensive chronic kidney disease with stage 1 through stage 4 chronic kidney disease, or unspecified chronic kidney disease: Secondary | ICD-10-CM | POA: Diagnosis not present

## 2018-03-31 DIAGNOSIS — B961 Klebsiella pneumoniae [K. pneumoniae] as the cause of diseases classified elsewhere: Secondary | ICD-10-CM | POA: Diagnosis not present

## 2018-04-03 DIAGNOSIS — N390 Urinary tract infection, site not specified: Secondary | ICD-10-CM | POA: Diagnosis not present

## 2018-04-03 DIAGNOSIS — F0150 Vascular dementia without behavioral disturbance: Secondary | ICD-10-CM | POA: Diagnosis not present

## 2018-04-03 DIAGNOSIS — I129 Hypertensive chronic kidney disease with stage 1 through stage 4 chronic kidney disease, or unspecified chronic kidney disease: Secondary | ICD-10-CM | POA: Diagnosis not present

## 2018-04-03 DIAGNOSIS — F329 Major depressive disorder, single episode, unspecified: Secondary | ICD-10-CM | POA: Diagnosis not present

## 2018-04-03 DIAGNOSIS — I69351 Hemiplegia and hemiparesis following cerebral infarction affecting right dominant side: Secondary | ICD-10-CM | POA: Diagnosis not present

## 2018-04-03 DIAGNOSIS — B961 Klebsiella pneumoniae [K. pneumoniae] as the cause of diseases classified elsewhere: Secondary | ICD-10-CM | POA: Diagnosis not present

## 2018-05-08 DIAGNOSIS — R41841 Cognitive communication deficit: Secondary | ICD-10-CM | POA: Diagnosis not present

## 2018-07-04 DIAGNOSIS — G8190 Hemiplegia, unspecified affecting unspecified side: Secondary | ICD-10-CM | POA: Diagnosis not present

## 2018-07-04 DIAGNOSIS — N183 Chronic kidney disease, stage 3 (moderate): Secondary | ICD-10-CM | POA: Diagnosis not present

## 2018-07-04 DIAGNOSIS — M1000 Idiopathic gout, unspecified site: Secondary | ICD-10-CM | POA: Diagnosis not present

## 2018-07-04 DIAGNOSIS — I10 Essential (primary) hypertension: Secondary | ICD-10-CM | POA: Diagnosis not present

## 2018-08-07 ENCOUNTER — Other Ambulatory Visit (HOSPITAL_COMMUNITY): Payer: Self-pay | Admitting: Internal Medicine

## 2018-08-07 ENCOUNTER — Ambulatory Visit (HOSPITAL_COMMUNITY)
Admission: RE | Admit: 2018-08-07 | Discharge: 2018-08-07 | Disposition: A | Discharge status: Home / Self Care | Payer: Medicare Other | Source: Ambulatory Visit | Attending: Internal Medicine | Admitting: Internal Medicine

## 2018-08-07 ENCOUNTER — Other Ambulatory Visit: Payer: Self-pay

## 2018-08-07 DIAGNOSIS — G8190 Hemiplegia, unspecified affecting unspecified side: Secondary | ICD-10-CM | POA: Diagnosis not present

## 2018-08-07 DIAGNOSIS — M545 Low back pain, unspecified: Secondary | ICD-10-CM

## 2018-08-07 DIAGNOSIS — M549 Dorsalgia, unspecified: Secondary | ICD-10-CM | POA: Diagnosis not present

## 2018-09-07 DIAGNOSIS — I10 Essential (primary) hypertension: Secondary | ICD-10-CM | POA: Diagnosis not present

## 2018-09-07 DIAGNOSIS — N183 Chronic kidney disease, stage 3 (moderate): Secondary | ICD-10-CM | POA: Diagnosis not present

## 2018-10-08 DIAGNOSIS — N183 Chronic kidney disease, stage 3 (moderate): Secondary | ICD-10-CM | POA: Diagnosis not present

## 2018-10-08 DIAGNOSIS — I10 Essential (primary) hypertension: Secondary | ICD-10-CM | POA: Diagnosis not present

## 2018-11-02 ENCOUNTER — Inpatient Hospital Stay (HOSPITAL_COMMUNITY)
Admission: EM | Admit: 2018-11-02 | Discharge: 2018-11-11 | DRG: 177 | Disposition: A | Discharge status: Home Health | Payer: Medicare Other | Attending: Internal Medicine | Admitting: Internal Medicine

## 2018-11-02 ENCOUNTER — Encounter (HOSPITAL_COMMUNITY): Payer: Self-pay | Admitting: *Deleted

## 2018-11-02 ENCOUNTER — Emergency Department (HOSPITAL_COMMUNITY): Payer: Medicare Other

## 2018-11-02 ENCOUNTER — Other Ambulatory Visit: Payer: Self-pay

## 2018-11-02 DIAGNOSIS — I129 Hypertensive chronic kidney disease with stage 1 through stage 4 chronic kidney disease, or unspecified chronic kidney disease: Secondary | ICD-10-CM | POA: Diagnosis present

## 2018-11-02 DIAGNOSIS — E860 Dehydration: Secondary | ICD-10-CM | POA: Diagnosis not present

## 2018-11-02 DIAGNOSIS — M6282 Rhabdomyolysis: Secondary | ICD-10-CM | POA: Diagnosis not present

## 2018-11-02 DIAGNOSIS — R05 Cough: Secondary | ICD-10-CM | POA: Diagnosis not present

## 2018-11-02 DIAGNOSIS — N400 Enlarged prostate without lower urinary tract symptoms: Secondary | ICD-10-CM | POA: Diagnosis present

## 2018-11-02 DIAGNOSIS — L8990 Pressure ulcer of unspecified site, unspecified stage: Secondary | ICD-10-CM | POA: Insufficient documentation

## 2018-11-02 DIAGNOSIS — H9190 Unspecified hearing loss, unspecified ear: Secondary | ICD-10-CM | POA: Diagnosis present

## 2018-11-02 DIAGNOSIS — E782 Mixed hyperlipidemia: Secondary | ICD-10-CM | POA: Diagnosis not present

## 2018-11-02 DIAGNOSIS — I69351 Hemiplegia and hemiparesis following cerebral infarction affecting right dominant side: Secondary | ICD-10-CM

## 2018-11-02 DIAGNOSIS — N183 Chronic kidney disease, stage 3 unspecified: Secondary | ICD-10-CM

## 2018-11-02 DIAGNOSIS — N390 Urinary tract infection, site not specified: Secondary | ICD-10-CM

## 2018-11-02 DIAGNOSIS — F0150 Vascular dementia without behavioral disturbance: Secondary | ICD-10-CM | POA: Diagnosis not present

## 2018-11-02 DIAGNOSIS — U071 COVID-19: Secondary | ICD-10-CM | POA: Diagnosis present

## 2018-11-02 DIAGNOSIS — N179 Acute kidney failure, unspecified: Secondary | ICD-10-CM | POA: Diagnosis present

## 2018-11-02 DIAGNOSIS — R7881 Bacteremia: Secondary | ICD-10-CM | POA: Diagnosis present

## 2018-11-02 DIAGNOSIS — E785 Hyperlipidemia, unspecified: Secondary | ICD-10-CM | POA: Diagnosis present

## 2018-11-02 DIAGNOSIS — I10 Essential (primary) hypertension: Secondary | ICD-10-CM | POA: Diagnosis not present

## 2018-11-02 DIAGNOSIS — L89153 Pressure ulcer of sacral region, stage 3: Secondary | ICD-10-CM

## 2018-11-02 DIAGNOSIS — Z87891 Personal history of nicotine dependence: Secondary | ICD-10-CM

## 2018-11-02 DIAGNOSIS — B9689 Other specified bacterial agents as the cause of diseases classified elsewhere: Secondary | ICD-10-CM | POA: Diagnosis present

## 2018-11-02 DIAGNOSIS — R531 Weakness: Secondary | ICD-10-CM | POA: Diagnosis not present

## 2018-11-02 DIAGNOSIS — R5381 Other malaise: Secondary | ICD-10-CM | POA: Diagnosis present

## 2018-11-02 DIAGNOSIS — Z79899 Other long term (current) drug therapy: Secondary | ICD-10-CM

## 2018-11-02 DIAGNOSIS — Z993 Dependence on wheelchair: Secondary | ICD-10-CM

## 2018-11-02 DIAGNOSIS — E1122 Type 2 diabetes mellitus with diabetic chronic kidney disease: Secondary | ICD-10-CM | POA: Diagnosis present

## 2018-11-02 HISTORY — DX: Rhabdomyolysis: M62.82

## 2018-11-02 HISTORY — DX: Dehydration: E86.0

## 2018-11-02 LAB — URINALYSIS, ROUTINE W REFLEX MICROSCOPIC
Bilirubin Urine: NEGATIVE
Glucose, UA: NEGATIVE mg/dL
Ketones, ur: NEGATIVE mg/dL
Nitrite: POSITIVE — AB
Protein, ur: 100 mg/dL — AB
Specific Gravity, Urine: <1.005 — ABNORMAL LOW (ref 1.005–1.030)
pH: >9 — ABNORMAL HIGH (ref 5.0–8.0)

## 2018-11-02 LAB — CK: Total CK: 518 U/L — ABNORMAL HIGH (ref 49–397)

## 2018-11-02 LAB — CBC WITH DIFFERENTIAL/PLATELET
Abs Immature Granulocytes: 0.02 10*3/uL (ref 0.00–0.07)
Basophils Absolute: 0 10*3/uL (ref 0.0–0.1)
Basophils Relative: 0 %
Eosinophils Absolute: 0 10*3/uL (ref 0.0–0.5)
Eosinophils Relative: 0 %
HCT: 43 % (ref 39.0–52.0)
Hemoglobin: 13.3 g/dL (ref 13.0–17.0)
Immature Granulocytes: 0 %
Lymphocytes Relative: 19 %
Lymphs Abs: 1.3 10*3/uL (ref 0.7–4.0)
MCH: 30.3 pg (ref 26.0–34.0)
MCHC: 30.9 g/dL (ref 30.0–36.0)
MCV: 97.9 fL (ref 80.0–100.0)
Monocytes Absolute: 0.8 10*3/uL (ref 0.1–1.0)
Monocytes Relative: 13 %
Neutro Abs: 4.4 10*3/uL (ref 1.7–7.7)
Neutrophils Relative %: 68 %
Platelets: 169 10*3/uL (ref 150–400)
RBC: 4.39 MIL/uL (ref 4.22–5.81)
RDW: 12 % (ref 11.5–15.5)
WBC: 6.5 10*3/uL (ref 4.0–10.5)
nRBC: 0 % (ref 0.0–0.2)

## 2018-11-02 LAB — COMPREHENSIVE METABOLIC PANEL
ALT: 19 U/L (ref 0–44)
AST: 34 U/L (ref 15–41)
Albumin: 3.3 g/dL — ABNORMAL LOW (ref 3.5–5.0)
Alkaline Phosphatase: 138 U/L — ABNORMAL HIGH (ref 38–126)
Anion gap: 10 (ref 5–15)
BUN: 28 mg/dL — ABNORMAL HIGH (ref 8–23)
CO2: 25 mmol/L (ref 22–32)
Calcium: 8.3 mg/dL — ABNORMAL LOW (ref 8.9–10.3)
Chloride: 100 mmol/L (ref 98–111)
Creatinine, Ser: 1.53 mg/dL — ABNORMAL HIGH (ref 0.61–1.24)
GFR calc Af Amer: 46 mL/min — ABNORMAL LOW (ref >60)
GFR calc non Af Amer: 40 mL/min — ABNORMAL LOW (ref >60)
Glucose, Bld: 222 mg/dL — ABNORMAL HIGH (ref 70–99)
Potassium: 3.7 mmol/L (ref 3.5–5.1)
Sodium: 135 mmol/L (ref 135–145)
Total Bilirubin: 0.9 mg/dL (ref 0.3–1.2)
Total Protein: 7.3 g/dL (ref 6.5–8.1)

## 2018-11-02 LAB — URINALYSIS, MICROSCOPIC (REFLEX)
Squamous Epithelial / LPF: NONE SEEN (ref 0–5)
WBC, UA: >50 WBC/hpf (ref 0–5)

## 2018-11-02 LAB — LACTIC ACID, PLASMA
Lactic Acid, Venous: 2.4 mmol/L (ref 0.5–1.9)
Lactic Acid, Venous: 1.3 mmol/L (ref 0.5–1.9)

## 2018-11-02 LAB — APTT: aPTT: 30 seconds (ref 24–36)

## 2018-11-02 LAB — PROTIME-INR
INR: 1 (ref 0.8–1.2)
Prothrombin Time: 12.8 seconds (ref 11.4–15.2)

## 2018-11-02 MED ORDER — COLCHICINE 0.6 MG PO TABS
0.6000 mg | ORAL_TABLET | Freq: Every morning | ORAL | Status: DC
  Administered 2018-11-03 – 2018-11-11 (×9): 0.6 mg via ORAL
  Filled 2018-11-02 (×10): qty 1

## 2018-11-02 MED ORDER — SODIUM CHLORIDE 0.9 % IV BOLUS (SEPSIS)
1000.0000 mL | Freq: Once | INTRAVENOUS | Status: AC
  Administered 2018-11-02: 1000 mL via INTRAVENOUS

## 2018-11-02 MED ORDER — ONDANSETRON HCL 4 MG/2ML IJ SOLN: 4.0000 mg | Freq: Four times a day (QID) | INTRAMUSCULAR | Status: DC | PRN

## 2018-11-02 MED ORDER — TAMSULOSIN HCL 0.4 MG PO CAPS
0.4000 mg | ORAL_CAPSULE | Freq: Every day | ORAL | Status: DC
  Administered 2018-11-03 – 2018-11-11 (×9): 0.4 mg via ORAL
  Filled 2018-11-02 (×9): qty 1

## 2018-11-02 MED ORDER — ACETAMINOPHEN 650 MG RE SUPP: 650.0000 mg | Freq: Four times a day (QID) | RECTAL | Status: DC | PRN

## 2018-11-02 MED ORDER — SODIUM CHLORIDE 0.9 % IV SOLN
1.0000 g | INTRAVENOUS | Status: AC
  Administered 2018-11-02 – 2018-11-06 (×5): 1 g via INTRAVENOUS
  Filled 2018-11-02 (×2): qty 1
  Filled 2018-11-02: qty 10
  Filled 2018-11-02: qty 1
  Filled 2018-11-02: qty 10

## 2018-11-02 MED ORDER — MEMANTINE HCL 10 MG PO TABS
10.0000 mg | ORAL_TABLET | Freq: Two times a day (BID) | ORAL | Status: DC
  Administered 2018-11-02 – 2018-11-11 (×18): 10 mg via ORAL
  Filled 2018-11-02 (×20): qty 1

## 2018-11-02 MED ORDER — SODIUM CHLORIDE 0.9 % IV SOLN
INTRAVENOUS | Status: DC
  Administered 2018-11-02 – 2018-11-03 (×3): via INTRAVENOUS

## 2018-11-02 MED ORDER — ATORVASTATIN CALCIUM 10 MG PO TABS: 10.0000 mg | ORAL_TABLET | Freq: Every evening | ORAL | Status: DC

## 2018-11-02 MED ORDER — METOPROLOL SUCCINATE ER 25 MG PO TB24
25.0000 mg | ORAL_TABLET | Freq: Every morning | ORAL | Status: DC
  Administered 2018-11-03 – 2018-11-11 (×9): 25 mg via ORAL
  Filled 2018-11-02 (×10): qty 1

## 2018-11-02 MED ORDER — ENOXAPARIN SODIUM 60 MG/0.6ML ~~LOC~~ SOLN
45.0000 mg | SUBCUTANEOUS | Status: DC
  Administered 2018-11-02 – 2018-11-03 (×2): 45 mg via SUBCUTANEOUS
  Filled 2018-11-02 (×2): qty 0.6

## 2018-11-02 MED ORDER — ACETAMINOPHEN 325 MG PO TABS
650.0000 mg | ORAL_TABLET | Freq: Four times a day (QID) | ORAL | Status: DC | PRN
  Administered 2018-11-04 (×2): 650 mg via ORAL
  Filled 2018-11-02 (×3): qty 2

## 2018-11-02 MED ORDER — ONDANSETRON HCL 4 MG PO TABS: 4.0000 mg | ORAL_TABLET | Freq: Four times a day (QID) | ORAL | Status: DC | PRN

## 2018-11-02 MED ORDER — SODIUM CHLORIDE 0.9 % IV SOLN: 1.0000 g | INTRAVENOUS | Status: DC

## 2018-11-02 NOTE — Progress Notes (Deleted)
**Note Mike Briggs** History and Physical    ORDEAN MURLEY [id number] DOB: January 08, 1929 DOA: 11/02/2018  PCP: Rosita Fire, MD  Patient coming from: home  I have personally briefly reviewed patient's old medical records in [place]  Chief Complaint: generalized weakness  HPI: [name] is a 85 y.o. male with medical history significant of chronic kidney disease stage III, hypertension, hyperlipidemia who is wheelchair-bound.  Patient lives independently and has an aide come and help him periodically.  He was sent to the emergency room today on recommendation from his aide for progressive weakness.  Patient reports that he has no energy.  He is noticed decreased urine output as well as dysuria for the last several days.  He has had no vomiting, he does have a nonproductive cough.  Denies any shortness of breath or chest pain.  No diarrhea.  He reports having fevers today.  ED Course: Patient is noted to be clinically dehydrated.  Creatinine is mildly up above baseline.  Chest x-ray did not show any evidence of pneumonia.  Urinalysis indicates possible infection.  Lactic acid is elevated.  He has been referred for admission.  Review of Systems:  General: Positive for fever, generalized weakness, negative for weight loss Chest: Positive for cough, negative shortness of breath or wheezing Cardiac: Negative for chest pain, palpitations GI: Negative for vomiting, diarrhea, positive for suprapubic pain GU: Positive for dysuria All other systems reviewed and found to be negative.   Past Medical History:  Diagnosis Date  . Arthritis   . Cataract   . Chronic kidney disease   . Depression   . Diabetes mellitus   . Hyperlipidemia   . Hypertension   . Rhabdomyolysis 11/02/2018  . Stroke [facility])     Past Surgical History:  Procedure Laterality Date  . BACK SURGERY     cervical and lumbar  . CATARACT EXTRACTION    . CHOLECYSTECTOMY    . FOOT SURGERY     5th toe   . SPINE SURGERY     lumbar and cervical fusions    Social History:  reports that he quit smoking about 51 years ago. He has never used smokeless tobacco. He reports that he does not drink alcohol or use drugs.  No Known Allergies  Family History  Family history unknown: Yes     Prior to Admission medications   Medication Sig Start Date End Date Taking? Authorizing Provider  atorvastatin (LIPITOR) 10 MG tablet TAKE 1 TABLET BY MOUTH AFTER SUPPER. Patient taking differently: Take 10 mg by mouth every evening.  08/07/17  Yes Hagler, Apolonio Schneiders, MD  colchicine 0.6 MG tablet Take 1 tablet (0.6 mg total) by mouth daily. Patient taking differently: Take 0.6 mg by mouth every morning.  01/10/18  Yes Julianne Rice, MD  lisinopril (PRINIVIL,ZESTRIL) 40 MG tablet TAKE (1) TABLET BY MOUTH ONCE DAILY. Patient taking differently: Take 40 mg by mouth every morning.  05/20/17  Yes Raylene Everts, MD  memantine (NAMENDA) 10 MG tablet Take 10 mg by mouth 2 (two) times daily.  04/18/17  Yes [provider]  metoprolol succinate (TOPROL-XL) 25 MG 24 hr tablet TAKE (1) TABLET BY MOUTH ONCE DAILY. Patient taking differently: Take 25 mg by mouth every morning.  05/20/17  Yes Raylene Everts, MD  tamsulosin [facility]) 0.4 MG CAPS capsule Take 1 capsule (0.4 mg total) by mouth daily after breakfast. 02/28/18  Yes Murlean Iba, MD    Physical Exam: Vitals:   11/02/18 1200 11/02/18 1300  11/02/18 1400 11/02/18 1500  BP: (!) 146/86 (!) 144/87 (!) 166/101 (!) 165/76  Pulse: 90 81 84 84  Resp: 18 18 18 16   Temp:      TempSrc:      SpO2: 94% 97% 94% 96%  Weight:      Height:        Constitutional: NAD, calm, comfortable Eyes: PERRL, lids and conjunctivae normal ENMT: Mucous membranes are moist. Posterior pharynx clear of any exudate or lesions.Normal dentition.  Neck: normal, supple, no masses, no thyromegaly Respiratory: clear to auscultation bilaterally, no wheezing, no crackles. Normal respiratory effort.  No accessory muscle use.  Cardiovascular: Regular rate and rhythm, no murmurs / rubs / gallops. No extremity edema. 2+ pedal pulses. No carotid bruits.  Abdomen: no tenderness, no masses palpated. No hepatosplenomegaly. Bowel sounds positive.  Musculoskeletal: no clubbing / cyanosis. No joint deformity upper and lower extremities. Good ROM, no contractures. Normal muscle tone.  Skin: no rashes, lesions, ulcers. No induration Neurologic: CN 2-12 grossly intact. Sensation intact, DTR normal.  Strength equal bilaterally in all 4 extremities Psychiatric: Normal judgment and insight. Alert and oriented x 3. Normal mood.    Labs on Admission: I have personally reviewed following labs and imaging studies  CBC: Recent Labs  Lab 11/02/18 1221  WBC 6.5  NEUTROABS 4.4  HGB 13.3  HCT 43.0  MCV 97.9  PLT 123XX123   Basic Metabolic Panel: Recent Labs  Lab 11/02/18 1221  NA 135  K 3.7  CL 100  CO2 25  GLUCOSE 222*  BUN 28*  CREATININE 1.53*  CALCIUM 8.3*   GFR: Estimated Creatinine Clearance: 38.5 mL/min (A) (by C-G formula based on SCr of 1.53 mg/dL (H)). Liver Function Tests: Recent Labs  Lab 11/02/18 1221  AST 34  ALT 19  ALKPHOS 138*  BILITOT 0.9  PROT 7.3  ALBUMIN 3.3*   No results for input(s): LIPASE, AMYLASE in the last 168 hours. No results for input(s): AMMONIA in the last 168 hours. Coagulation Profile: Recent Labs  Lab 11/02/18 1221  INR 1.0   Cardiac Enzymes: Recent Labs  Lab 11/02/18 1221  CKTOTAL 518*   BNP (last 3 results) No results for input(s): PROBNP in the last 8760 hours. HbA1C: No results for input(s): HGBA1C in the last 72 hours. CBG: No results for input(s): GLUCAP in the last 168 hours. Lipid Profile: No results for input(s): CHOL, HDL, LDLCALC, TRIG, CHOLHDL, LDLDIRECT in the last 72 hours. Thyroid Function Tests: No results for input(s): TSH, T4TOTAL, FREET4, T3FREE, THYROIDAB in the last 72 hours. Anemia Panel: No results for input(s):  VITAMINB12, FOLATE, FERRITIN, TIBC, IRON, RETICCTPCT in the last 72 hours. Urine analysis:    Component Value Date/Time   COLORURINE YELLOW 11/02/2018 1221   APPEARANCEUR TURBID (A) 11/02/2018 1221   LABSPEC <1.005 (L) 11/02/2018 1221   PHURINE >9.0 (H) 11/02/2018 1221   GLUCOSEU NEGATIVE 11/02/2018 1221   HGBUR MODERATE (A) 11/02/2018 1221   BILIRUBINUR NEGATIVE 11/02/2018 1221   KETONESUR NEGATIVE 11/02/2018 1221   PROTEINUR 100 (A) 11/02/2018 1221   UROBILINOGEN 0.2 03/21/2014 1840   NITRITE POSITIVE (A) 11/02/2018 1221   LEUKOCYTESUR LARGE (A) 11/02/2018 1221    Radiological Exams on Admission: Dg Chest Portable 1 View  Result Date: 11/02/2018 CLINICAL DATA:  Cough, increased weakness EXAM: PORTABLE CHEST 1 VIEW COMPARISON:  02/25/2018, 10/20/2012 FINDINGS: The heart size and mediastinal contours are within normal limits. Underpenetrated AP portable examination without acute appearing abnormality of the lungs. The visualized skeletal  structures are unremarkable. IMPRESSION: Underpenetrated AP portable examination without acute appearing abnormality of the lungs. Electronically Signed   By: Eddie Candle M.D.   On: 11/02/2018 13:08    EKG: Independently reviewed.  Machine reading indicates atrial flutter, although I suspect this is sinus rhythm with significant artifact  Assessment/Plan Active Problems:   Chronic kidney disease, stage III (moderate) (HCC)   Vascular dementia (HCC)   Essential hypertension   BPH (benign prostatic hyperplasia)   HLD (hyperlipidemia)   UTI (urinary tract infection)   Dehydration   Rhabdomyolysis     1. Urinary tract infection.  Started on ceftriaxone.  Follow-up urine culture. 2. Dehydration.  Mild elevation of lactic acid, improved with hydration.  Continue current treatments. 3. Chronic kidney disease stage III.  Creatinine is near baseline.  Suspect further improvement with IV fluids. 4. Hypertension.  Hold lisinopril.  Continue  metoprolol. 5. Rhabdomyolysis.  Likely related to dehydration.  Continue IV fluids and recheck labs in a.m. 6. Hyperlipidemia.  Continue statin. 7. BPH.  Continue Flomax 8. Vascular dementia.  Continue Namenda. 9. Generalized weakness.  Related to dehydration urinary tract infection.  At baseline, he appears to be nonambulatory and is dependent on power chair.  DVT prophylaxis: lovenox  Code Status: full code  Family Communication: unable to reach niece over the phone  Disposition Plan: return home on discharge  Consults called:   Admission status: observation, medsurg   Kathie Dike MD [facility]   If 7PM-7AM, please contact night-coverage [email or web address]   11/02/2018, 4:29 PM

## 2018-11-02 NOTE — ED Notes (Signed)
Date and time results received: 11/02/18 1:18 PM  (use smartphrase ".now" to insert current time)  Test: Lactic Critical Value: 2.4  Name of Provider Notified: [place]  Orders Received? Or Actions Taken?: Orders Received - See Orders for details

## 2018-11-02 NOTE — ED Notes (Signed)
Nurse attempted to call pt's niece to notifiy that pt is getting admitted; nurse called niece's home and cell phone with no answer and was not able to leave message.

## 2018-11-02 NOTE — ED Triage Notes (Signed)
Pt's aid called ems for increase weakness and poor appetite for a few days. Pt denies n/v/d and no pain. Pt states he has had decrease urination.

## 2018-11-02 NOTE — H&P (Signed)
History and Physical    Mike Briggs [id number] DOB: November 11, 1928 DOA: 11/02/2018  PCP: Rosita Fire, MD  Patient coming from: home  I have personally briefly reviewed patient's old medical records in [place]  Chief Complaint: generalized weakness  HPI: Mike Briggs is a 85 y.o. male with medical history significant of chronic kidney disease stage III, hypertension, hyperlipidemia who is wheelchair-bound.  Patient lives independently and has an aide come and help him periodically.  He was sent to the emergency room today on recommendation from his aide for progressive weakness.  Patient reports that he has no energy.  He is noticed decreased urine output as well as dysuria for the last several days.  He has had no vomiting, he does have a nonproductive cough.  Denies any shortness of breath or chest pain.  No diarrhea.  He reports having fevers today.  ED Course: Patient is noted to be clinically dehydrated.  Creatinine is mildly up above baseline.  Chest x-ray did not show any evidence of pneumonia.  Urinalysis indicates possible infection.  Lactic acid is elevated.  He has been referred for admission.  Review of Systems:  General: Positive for fever, generalized weakness, negative for weight loss Chest: Positive for cough, negative shortness of breath or wheezing Cardiac: Negative for chest pain, palpitations GI: Negative for vomiting, diarrhea, positive for suprapubic pain GU: Positive for dysuria All other systems reviewed and found to be negative.   Past Medical History:  Diagnosis Date  . Arthritis   . Cataract   . Chronic kidney disease   . Depression   . Diabetes mellitus   . Hyperlipidemia   . Hypertension   . Rhabdomyolysis 11/02/2018  . Stroke [facility])     Past Surgical History:  Procedure Laterality Date  . BACK SURGERY     cervical and lumbar  . CATARACT EXTRACTION    . CHOLECYSTECTOMY    . FOOT SURGERY     5th toe   . SPINE SURGERY     lumbar and cervical fusions    Social History:  reports that he quit smoking about 51 years ago. He has never used smokeless tobacco. He reports that he does not drink alcohol or use drugs.  No Known Allergies  Family History  Family history unknown: Yes     Prior to Admission medications   Medication Sig Start Date End Date Taking? Authorizing Provider  atorvastatin (LIPITOR) 10 MG tablet TAKE 1 TABLET BY MOUTH AFTER SUPPER. Patient taking differently: Take 10 mg by mouth every evening.  08/07/17  Yes Hagler, Apolonio Schneiders, MD  colchicine 0.6 MG tablet Take 1 tablet (0.6 mg total) by mouth daily. Patient taking differently: Take 0.6 mg by mouth every morning.  01/10/18  Yes Julianne Rice, MD  lisinopril (PRINIVIL,ZESTRIL) 40 MG tablet TAKE (1) TABLET BY MOUTH ONCE DAILY. Patient taking differently: Take 40 mg by mouth every morning.  05/20/17  Yes Raylene Everts, MD  memantine (NAMENDA) 10 MG tablet Take 10 mg by mouth 2 (two) times daily.  04/18/17  Yes [provider]  metoprolol succinate (TOPROL-XL) 25 MG 24 hr tablet TAKE (1) TABLET BY MOUTH ONCE DAILY. Patient taking differently: Take 25 mg by mouth every morning.  05/20/17  Yes Raylene Everts, MD  tamsulosin [facility]) 0.4 MG CAPS capsule Take 1 capsule (0.4 mg total) by mouth daily after breakfast. 02/28/18  Yes Murlean Iba, MD    Physical Exam: Vitals:   11/02/18 1400 11/02/18 1500  11/02/18 1600 11/02/18 1648  BP: (!) 166/101 (!) 165/76 (!) 171/93 (!) 168/81  Pulse: 84 84 84 80  Resp: 18 16 20 18   Temp:    97.9 F (36.6 C)  TempSrc:    Oral  SpO2: 94% 96% 98% 98%  Weight:      Height:    5\' 11"  ([id number] m)    Constitutional: NAD, calm, comfortable Eyes: PERRL, lids and conjunctivae normal ENMT: Mucous membranes are moist. Posterior pharynx clear of any exudate or lesions.Normal dentition.  Neck: normal, supple, no masses, no thyromegaly Respiratory: clear to auscultation bilaterally, no wheezing, no  crackles. Normal respiratory effort. No accessory muscle use.  Cardiovascular: Regular rate and rhythm, no murmurs / rubs / gallops. No extremity edema. 2+ pedal pulses. No carotid bruits.  Abdomen: no tenderness, no masses palpated. No hepatosplenomegaly. Bowel sounds positive.  Musculoskeletal: no clubbing / cyanosis. No joint deformity upper and lower extremities. Good ROM, no contractures. Normal muscle tone.  Skin: no rashes, lesions, ulcers. No induration Neurologic: CN 2-12 grossly intact. Sensation intact, DTR normal.  Strength equal bilaterally in all 4 extremities Psychiatric: Normal judgment and insight. Alert and oriented x 3. Normal mood.    Labs on Admission: I have personally reviewed following labs and imaging studies  CBC: Recent Labs  Lab 11/02/18 1221  WBC 6.5  NEUTROABS 4.4  HGB 13.3  HCT 43.0  MCV 97.9  PLT 123XX123   Basic Metabolic Panel: Recent Labs  Lab 11/02/18 1221  NA 135  K 3.7  CL 100  CO2 25  GLUCOSE 222*  BUN 28*  CREATININE 1.53*  CALCIUM 8.3*   GFR: Estimated Creatinine Clearance: 38.5 mL/min (A) (by C-G formula based on SCr of 1.53 mg/dL (H)). Liver Function Tests: Recent Labs  Lab 11/02/18 1221  AST 34  ALT 19  ALKPHOS 138*  BILITOT 0.9  PROT 7.3  ALBUMIN 3.3*   No results for input(s): LIPASE, AMYLASE in the last 168 hours. No results for input(s): AMMONIA in the last 168 hours. Coagulation Profile: Recent Labs  Lab 11/02/18 1221  INR 1.0   Cardiac Enzymes: Recent Labs  Lab 11/02/18 1221  CKTOTAL 518*   BNP (last 3 results) No results for input(s): PROBNP in the last 8760 hours. HbA1C: No results for input(s): HGBA1C in the last 72 hours. CBG: No results for input(s): GLUCAP in the last 168 hours. Lipid Profile: No results for input(s): CHOL, HDL, LDLCALC, TRIG, CHOLHDL, LDLDIRECT in the last 72 hours. Thyroid Function Tests: No results for input(s): TSH, T4TOTAL, FREET4, T3FREE, THYROIDAB in the last 72 hours.  Anemia Panel: No results for input(s): VITAMINB12, FOLATE, FERRITIN, TIBC, IRON, RETICCTPCT in the last 72 hours. Urine analysis:    Component Value Date/Time   COLORURINE YELLOW 11/02/2018 1221   APPEARANCEUR TURBID (A) 11/02/2018 1221   LABSPEC <1.005 (L) 11/02/2018 1221   PHURINE >9.0 (H) 11/02/2018 1221   GLUCOSEU NEGATIVE 11/02/2018 1221   HGBUR MODERATE (A) 11/02/2018 1221   BILIRUBINUR NEGATIVE 11/02/2018 1221   KETONESUR NEGATIVE 11/02/2018 1221   PROTEINUR 100 (A) 11/02/2018 1221   UROBILINOGEN 0.2 03/21/2014 1840   NITRITE POSITIVE (A) 11/02/2018 1221   LEUKOCYTESUR LARGE (A) 11/02/2018 1221    Radiological Exams on Admission: Dg Chest Portable 1 View  Result Date: 11/02/2018 CLINICAL DATA:  Cough, increased weakness EXAM: PORTABLE CHEST 1 VIEW COMPARISON:  02/25/2018, 10/20/2012 FINDINGS: The heart size and mediastinal contours are within normal limits. Underpenetrated AP portable examination without acute appearing abnormality  of the lungs. The visualized skeletal structures are unremarkable. IMPRESSION: Underpenetrated AP portable examination without acute appearing abnormality of the lungs. Electronically Signed   By: Eddie Candle M.D.   On: 11/02/2018 13:08    EKG: Independently reviewed.  Machine reading indicates atrial flutter, although I suspect this is sinus rhythm with significant artifact  Assessment/Plan Active Problems:   Chronic kidney disease, stage III (moderate) (HCC)   Vascular dementia (HCC)   Essential hypertension   BPH (benign prostatic hyperplasia)   HLD (hyperlipidemia)   UTI (urinary tract infection)   Dehydration   Rhabdomyolysis     1. Urinary tract infection.  Started on ceftriaxone.  Follow-up urine culture. 2. Dehydration.  Mild elevation of lactic acid, improved with hydration.  Continue current treatments. 3. Chronic kidney disease stage III.  Creatinine is near baseline.  Suspect further improvement with IV fluids. 4.  Hypertension.  Hold lisinopril.  Continue metoprolol. 5. Rhabdomyolysis.  Likely related to dehydration.  Continue IV fluids and recheck labs in a.m. 6. Hyperlipidemia.  Continue statin. 7. BPH.  Continue Flomax 8. Vascular dementia.  Continue Namenda. 9. Generalized weakness.  Related to dehydration urinary tract infection.  At baseline, he appears to be nonambulatory and is dependent on power chair.  DVT prophylaxis: lovenox  Code Status: full code  Family Communication: unable to reach niece over the phone  Disposition Plan: return home on discharge  Consults called:   Admission status: observation, medsurg   Kathie Dike MD [facility]   If 7PM-7AM, please contact night-coverage [email or web address]   11/02/2018, 7:33 PM

## 2018-11-02 NOTE — ED Provider Notes (Signed)
[facility] EMERGENCY DEPARTMENT Provider Note   CSN: [id number] Arrival date & time: 11/02/18  1140     History   Chief Complaint Chief Complaint  Patient presents with  . Weakness    HPI Mike Briggs is a 85 y.o. male.     The history is provided by the patient. No language interpreter was used.  Weakness    85 year old male with history of diabetes, hypertension, hyperlipidemia, prior stroke, depression brought here via EMS from home for evaluation of generalized weakness.  History is difficult to obtain as patient is hard of hearing and is a poor historian.  Patient lives at home by himself in an apartment and does have an aide that visit him daily for at least an hour day to help with ADL.  According to him, his aide recommend patient to come to the ER to be evaluated for generalized weakness.  Patient at this time without any complaint.  He does endorse that he is having increased weakness to transfer himself from the bed to the power chair since yesterday.  He does have some complaint of throat irritation, occasional cough for the past 3 to 4 days.  He does not complain of any fever chills no headache, no chest pain no shortness of breath no back pain.  He does endorse urinary discomfort for an unknown amount of time.  He denies any new focal numbness weakness.  He does have residual hemiparesis affecting the right side due to late effect of prior stroke.  He denies any recent change in medication.  He denies nausea vomiting or diarrhea.  No report of any recent sick contact.  Past Medical History:  Diagnosis Date  . Arthritis   . Cataract   . Chronic kidney disease   . Depression   . Diabetes mellitus   . Hyperlipidemia   . Hypertension   . Stroke [facility])     Patient Active Problem List   Diagnosis Date Noted  . UTI (urinary tract infection) 02/26/2018  . Syncope 02/25/2018  . Vitamin D deficiency 05/31/2016  . Essential hypertension 05/22/2016  . Hemiparesis  affecting right side as late effect of cerebrovascular accident (CVA) ([place]) 05/22/2016  . Personal history of gout 05/22/2016  . BPH (benign prostatic hyperplasia) 05/22/2016  . Chronic constipation 05/22/2016  . HLD (hyperlipidemia) 05/22/2016  . Pseudophakia 12/06/2015  . Vascular dementia ([place]) 10/23/2012  . Chronic kidney disease, stage III (moderate) ([place]) 10/20/2012  . Type 2 diabetes mellitus with diabetic chronic kidney disease ([place]) 12/16/2008  . ANKLE, ARTHRITIS, DEGEN./OSTEO 12/16/2008    Past Surgical History:  Procedure Laterality Date  . BACK SURGERY     cervical and lumbar  . CATARACT EXTRACTION    . CHOLECYSTECTOMY    . FOOT SURGERY     5th toe   . SPINE SURGERY     lumbar and cervical fusions        Home Medications    Prior to Admission medications   Medication Sig Start Date End Date Taking? Authorizing Provider  atorvastatin (LIPITOR) 10 MG tablet TAKE 1 TABLET BY MOUTH AFTER SUPPER. Patient taking differently: Take 10 mg by mouth every evening.  08/07/17   Caren Macadam, MD  colchicine 0.6 MG tablet Take 1 tablet (0.6 mg total) by mouth daily. Patient taking differently: Take 0.6 mg by mouth every morning.  01/10/18   Julianne Rice, MD  lisinopril (PRINIVIL,ZESTRIL) 40 MG tablet TAKE (1) TABLET BY MOUTH ONCE DAILY. Patient taking differently: Take  40 mg by mouth every morning.  05/20/17   Raylene Everts, MD  memantine (NAMENDA) 10 MG tablet Take 10 mg by mouth 2 (two) times daily.  04/18/17   [provider]  metoprolol succinate (TOPROL-XL) 25 MG 24 hr tablet TAKE (1) TABLET BY MOUTH ONCE DAILY. Patient taking differently: Take 25 mg by mouth every morning.  05/20/17   Raylene Everts, MD  tamsulosin [facility]) 0.4 MG CAPS capsule Take 1 capsule (0.4 mg total) by mouth daily after breakfast. 02/28/18   Murlean Iba, MD    Family History Family History  Family history unknown: Yes    Social History Social History   Tobacco  Use  . Smoking status: Former Smoker    Quit date: 08/07/1967    Years since quitting: 51.2  . Smokeless tobacco: Never Used  Substance Use Topics  . Alcohol use: No  . Drug use: No     Allergies   Patient has no known allergies.   Review of Systems Review of Systems  Neurological: Positive for weakness.  All other systems reviewed and are negative.    Physical Exam Updated Vital Signs BP (!) 162/94 (BP Location: Right Arm)   Pulse 96   Temp 98 F (36.7 C) (Oral)   Resp 18   Ht 5\' 11"  (1.803 m)   Wt 90.7 kg   SpO2 95%   BMI 27.89 kg/m   Physical Exam Vitals signs and nursing note reviewed.  Constitutional:      General: He is not in acute distress.    Appearance: He is well-developed.     Comments: Elderly male hard of hearing but in no acute discomfort and nontoxic in appearance  HENT:     Head: Atraumatic.     Mouth/Throat:     Mouth: Mucous membranes are moist.  Eyes:     Conjunctiva/sclera: Conjunctivae normal.  Neck:     Musculoskeletal: Normal range of motion and neck supple.  Cardiovascular:     Rate and Rhythm: Normal rate and regular rhythm.     Pulses: Normal pulses.     Heart sounds: Normal heart sounds.  Pulmonary:     Breath sounds: Rales present.     Comments: Faint crackles heard at lung bases on inspiration Abdominal:     Palpations: Abdomen is soft.     Tenderness: There is no abdominal tenderness.  Musculoskeletal:     Comments: 4 out of 5 strength all 4 extremities with poor effort.  Lymphadenopathy:     Cervical: No cervical adenopathy.  Skin:    Findings: No rash.  Neurological:     Mental Status: He is alert.     GCS: GCS eye subscore is 4. GCS verbal subscore is 5. GCS motor subscore is 6.     Cranial Nerves: Cranial nerves are intact.     Comments: Alert and oriented x2.  Psychiatric:        Mood and Affect: Mood normal.      ED Treatments / Results  Labs (all labs ordered are listed, but only abnormal results are  displayed) Labs Reviewed  LACTIC ACID, PLASMA - Abnormal; Notable for the following components:      Result Value   Lactic Acid, Venous 2.4 (*)    All other components within normal limits  CK - Abnormal; Notable for the following components:   Total CK 518 (*)    All other components within normal limits  COMPREHENSIVE METABOLIC PANEL - Abnormal; Notable  for the following components:   Glucose, Bld 222 (*)    BUN 28 (*)    Creatinine, Ser 1.53 (*)    Calcium 8.3 (*)    Albumin 3.3 (*)    Alkaline Phosphatase 138 (*)    GFR calc non Af Amer 40 (*)    GFR calc Af Amer 46 (*)    All other components within normal limits  URINALYSIS, ROUTINE W REFLEX MICROSCOPIC - Abnormal; Notable for the following components:   APPearance TURBID (*)    Specific Gravity, Urine <1.005 (*)    pH >9.0 (*)    Hgb urine dipstick MODERATE (*)    Protein, ur 100 (*)    Nitrite POSITIVE (*)    Leukocytes,Ua LARGE (*)    All other components within normal limits  URINALYSIS, MICROSCOPIC (REFLEX) - Abnormal; Notable for the following components:   Bacteria, UA MANY (*)    All other components within normal limits  URINE CULTURE  SARS CORONAVIRUS 2 (TAT 6-24 HRS)  CULTURE, BLOOD (ROUTINE X 2)  CULTURE, BLOOD (ROUTINE X 2)  CBC WITH DIFFERENTIAL/PLATELET  LACTIC ACID, PLASMA  APTT  PROTIME-INR    EKG None  ED ECG REPORT   Date: 11/02/2018  Rate: 84  Rhythm: atrial flutter  QRS Axis: normal  Intervals: normal  ST/T Wave abnormalities: normal  Conduction Disutrbances:none  Narrative Interpretation:   Old EKG Reviewed: unchanged  I have personally reviewed the EKG tracing and agree with the computerized printout as noted.   Radiology Dg Chest Portable 1 View  Result Date: 11/02/2018 CLINICAL DATA:  Cough, increased weakness EXAM: PORTABLE CHEST 1 VIEW COMPARISON:  02/25/2018, 10/20/2012 FINDINGS: The heart size and mediastinal contours are within normal limits. Underpenetrated AP portable  examination without acute appearing abnormality of the lungs. The visualized skeletal structures are unremarkable. IMPRESSION: Underpenetrated AP portable examination without acute appearing abnormality of the lungs. Electronically Signed   By: Eddie Candle M.D.   On: 11/02/2018 13:08    Procedures .Critical Care Performed by: Domenic Moras, PA-C Authorized by: Domenic Moras, PA-C   Critical care provider statement:    Critical care time (minutes):  30   Critical care was time spent personally by me on the following activities:  Discussions with consultants, evaluation of patient's response to treatment, examination of patient, ordering and performing treatments and interventions, ordering and review of laboratory studies, ordering and review of radiographic studies, pulse oximetry, re-evaluation of patient's condition, obtaining history from patient or surrogate and review of old charts   (including critical care time)  Medications Ordered in ED Medications  sodium chloride 0.9 % bolus 1,000 mL (1,000 mLs Intravenous New Bag/Given 11/02/18 1426)    And  sodium chloride 0.9 % bolus 1,000 mL (has no administration in time range)    And  sodium chloride 0.9 % bolus 1,000 mL (has no administration in time range)  cefTRIAXone (ROCEPHIN) 1 g in sodium chloride 0.9 % 100 mL IVPB (has no administration in time range)     Initial Impression / Assessment and Plan / ED Course  I have reviewed the triage vital signs and the nursing notes.  Pertinent labs & imaging results that were available during my care of the patient were reviewed by me and considered in my medical decision making (see chart for details).        BP (!) 166/101   Pulse 84   Temp 98 F (36.7 C) (Oral)   Resp 18   Ht 5'  11" (1.803 m)   Wt 90.7 kg   SpO2 94%   BMI 27.89 kg/m    Final Clinical Impressions(s) / ED Diagnoses   Final diagnoses:  Lower urinary tract infectious disease  Dehydration  Generalized weakness     ED Discharge Orders    None     12:17 PM Patient here with generalized weakness.  Does not have any significant complaints aside from urinary discomfort for an unknown amount of time and occasional throat irritation and nonproductive cough.  He is afebrile, no hypoxia, no hypotension.  Urine at bedside appears very cloudy with moderate amount of sediments concerning for potential underlying UTI causing his symptom.  1:56 PM UA is consistent with UTI.  Elevated lactic acid concerning for sepsis.  Evidence of AKI.  Code sepsis initiated, will fluid resuscitate with normal saline at 30 mL/kg.  Rocephin antibiotic given.  Will consult for admission.  2:31 PM Sepsis reassessment done.  Will consult medicine for admission. Care discussed with Dr. Roderic Palau  2:38 PM Appreciate consultation from [place], DR. Memon who agrees to see and admit pt.  COVID-19 test ordered.  Mike Briggs was evaluated in Emergency Department on 11/02/2018 for the symptoms described in the history of present illness. He was evaluated in the context of the global COVID-19 pandemic, which necessitated consideration that the patient might be at risk for infection with the SARS-CoV-2 virus that causes COVID-19. Institutional protocols and algorithms that pertain to the evaluation of patients at risk for COVID-19 are in a state of rapid change based on information released by regulatory bodies including the CDC and federal and state organizations. These policies and algorithms were followed during the patient's care in the ED.    Domenic Moras, PA-C 11/02/18 1441    Milton Ferguson, MD 11/02/18 [phone]

## 2018-11-03 DIAGNOSIS — M6282 Rhabdomyolysis: Secondary | ICD-10-CM | POA: Diagnosis present

## 2018-11-03 DIAGNOSIS — E860 Dehydration: Secondary | ICD-10-CM | POA: Diagnosis present

## 2018-11-03 DIAGNOSIS — R5381 Other malaise: Secondary | ICD-10-CM | POA: Diagnosis present

## 2018-11-03 DIAGNOSIS — Z7401 Bed confinement status: Secondary | ICD-10-CM | POA: Diagnosis not present

## 2018-11-03 DIAGNOSIS — E782 Mixed hyperlipidemia: Secondary | ICD-10-CM | POA: Diagnosis not present

## 2018-11-03 DIAGNOSIS — N184 Chronic kidney disease, stage 4 (severe): Secondary | ICD-10-CM | POA: Diagnosis not present

## 2018-11-03 DIAGNOSIS — Z79899 Other long term (current) drug therapy: Secondary | ICD-10-CM | POA: Diagnosis not present

## 2018-11-03 DIAGNOSIS — M2550 Pain in unspecified joint: Secondary | ICD-10-CM | POA: Diagnosis not present

## 2018-11-03 DIAGNOSIS — Z993 Dependence on wheelchair: Secondary | ICD-10-CM | POA: Diagnosis not present

## 2018-11-03 DIAGNOSIS — N179 Acute kidney failure, unspecified: Secondary | ICD-10-CM | POA: Diagnosis present

## 2018-11-03 DIAGNOSIS — R7881 Bacteremia: Secondary | ICD-10-CM | POA: Diagnosis present

## 2018-11-03 DIAGNOSIS — N390 Urinary tract infection, site not specified: Secondary | ICD-10-CM | POA: Diagnosis not present

## 2018-11-03 DIAGNOSIS — E785 Hyperlipidemia, unspecified: Secondary | ICD-10-CM | POA: Diagnosis present

## 2018-11-03 DIAGNOSIS — U071 COVID-19: Secondary | ICD-10-CM | POA: Diagnosis present

## 2018-11-03 DIAGNOSIS — L89153 Pressure ulcer of sacral region, stage 3: Secondary | ICD-10-CM | POA: Diagnosis present

## 2018-11-03 DIAGNOSIS — I129 Hypertensive chronic kidney disease with stage 1 through stage 4 chronic kidney disease, or unspecified chronic kidney disease: Secondary | ICD-10-CM | POA: Diagnosis present

## 2018-11-03 DIAGNOSIS — I469 Cardiac arrest, cause unspecified: Secondary | ICD-10-CM | POA: Diagnosis not present

## 2018-11-03 DIAGNOSIS — R402411 Glasgow coma scale score 13-15, in the field [EMT or ambulance]: Secondary | ICD-10-CM | POA: Diagnosis not present

## 2018-11-03 DIAGNOSIS — R52 Pain, unspecified: Secondary | ICD-10-CM | POA: Diagnosis not present

## 2018-11-03 DIAGNOSIS — F0150 Vascular dementia without behavioral disturbance: Secondary | ICD-10-CM | POA: Diagnosis present

## 2018-11-03 DIAGNOSIS — B9689 Other specified bacterial agents as the cause of diseases classified elsewhere: Secondary | ICD-10-CM | POA: Diagnosis present

## 2018-11-03 DIAGNOSIS — N183 Chronic kidney disease, stage 3 unspecified: Secondary | ICD-10-CM | POA: Diagnosis not present

## 2018-11-03 DIAGNOSIS — I69351 Hemiplegia and hemiparesis following cerebral infarction affecting right dominant side: Secondary | ICD-10-CM | POA: Diagnosis not present

## 2018-11-03 DIAGNOSIS — E1122 Type 2 diabetes mellitus with diabetic chronic kidney disease: Secondary | ICD-10-CM | POA: Diagnosis not present

## 2018-11-03 DIAGNOSIS — Z87891 Personal history of nicotine dependence: Secondary | ICD-10-CM | POA: Diagnosis not present

## 2018-11-03 DIAGNOSIS — H9190 Unspecified hearing loss, unspecified ear: Secondary | ICD-10-CM | POA: Diagnosis present

## 2018-11-03 DIAGNOSIS — N400 Enlarged prostate without lower urinary tract symptoms: Secondary | ICD-10-CM | POA: Diagnosis present

## 2018-11-03 DIAGNOSIS — I10 Essential (primary) hypertension: Secondary | ICD-10-CM | POA: Diagnosis not present

## 2018-11-03 HISTORY — DX: Bacteremia: R78.81

## 2018-11-03 HISTORY — DX: COVID-19: U07.1

## 2018-11-03 LAB — LACTATE DEHYDROGENASE: LDH: 206 U/L — ABNORMAL HIGH (ref 98–192)

## 2018-11-03 LAB — CK: Total CK: 536 U/L — ABNORMAL HIGH (ref 49–397)

## 2018-11-03 LAB — C-REACTIVE PROTEIN: CRP: 5.7 mg/dL — ABNORMAL HIGH (ref <1.0)

## 2018-11-03 LAB — BLOOD CULTURE ID PANEL (REFLEXED)

## 2018-11-03 LAB — BASIC METABOLIC PANEL
Anion gap: 9 (ref 5–15)
BUN: 25 mg/dL — ABNORMAL HIGH (ref 8–23)
CO2: 21 mmol/L — ABNORMAL LOW (ref 22–32)
Calcium: 7.3 mg/dL — ABNORMAL LOW (ref 8.9–10.3)
Chloride: 106 mmol/L (ref 98–111)
Creatinine, Ser: 1.22 mg/dL (ref 0.61–1.24)
GFR calc Af Amer: >60 mL/min (ref >60)
GFR calc non Af Amer: 53 mL/min — ABNORMAL LOW (ref >60)
Glucose, Bld: 182 mg/dL — ABNORMAL HIGH (ref 70–99)
Potassium: 3.5 mmol/L (ref 3.5–5.1)
Sodium: 136 mmol/L (ref 135–145)

## 2018-11-03 LAB — URINE CULTURE

## 2018-11-03 LAB — SARS CORONAVIRUS 2 (TAT 6-24 HRS): SARS Coronavirus 2: POSITIVE — AB

## 2018-11-03 LAB — CBC
HCT: 37.3 % — ABNORMAL LOW (ref 39.0–52.0)
Hemoglobin: 12 g/dL — ABNORMAL LOW (ref 13.0–17.0)
MCH: 30.9 pg (ref 26.0–34.0)
MCHC: 32.2 g/dL (ref 30.0–36.0)
MCV: 96.1 fL (ref 80.0–100.0)
Platelets: 145 10*3/uL — ABNORMAL LOW (ref 150–400)
RBC: 3.88 MIL/uL — ABNORMAL LOW (ref 4.22–5.81)
RDW: 12 % (ref 11.5–15.5)
WBC: 4.6 10*3/uL (ref 4.0–10.5)
nRBC: 0 % (ref 0.0–0.2)

## 2018-11-03 LAB — D-DIMER, QUANTITATIVE: D-Dimer, Quant: 1.07 ug/mL-FEU — ABNORMAL HIGH (ref 0.00–0.50)

## 2018-11-03 LAB — PROCALCITONIN: Procalcitonin: <0.1 ng/mL

## 2018-11-03 LAB — FERRITIN: Ferritin: 216 ng/mL (ref 24–336)

## 2018-11-03 LAB — FIBRINOGEN: Fibrinogen: 535 mg/dL — ABNORMAL HIGH (ref 210–475)

## 2018-11-03 MED ORDER — GUAIFENESIN ER 600 MG PO TB12
600.0000 mg | ORAL_TABLET | Freq: Two times a day (BID) | ORAL | Status: DC
  Administered 2018-11-03 – 2018-11-11 (×17): 600 mg via ORAL
  Filled 2018-11-03 (×17): qty 1

## 2018-11-03 MED ORDER — SENNOSIDES-DOCUSATE SODIUM 8.6-50 MG PO TABS
2.0000 | ORAL_TABLET | Freq: Every day | ORAL | Status: DC
  Administered 2018-11-03 – 2018-11-10 (×6): 2 via ORAL
  Filled 2018-11-03 (×8): qty 2

## 2018-11-03 MED ORDER — ZINC SULFATE 220 (50 ZN) MG PO CAPS
220.0000 mg | ORAL_CAPSULE | Freq: Every day | ORAL | Status: DC
  Administered 2018-11-03 – 2018-11-11 (×9): 220 mg via ORAL
  Filled 2018-11-03 (×10): qty 1

## 2018-11-03 MED ORDER — POLYETHYLENE GLYCOL 3350 17 G PO PACK
17.0000 g | PACK | Freq: Every day | ORAL | Status: DC
  Administered 2018-11-03 – 2018-11-09 (×6): 17 g via ORAL
  Filled 2018-11-03 (×7): qty 1

## 2018-11-03 MED ORDER — ALBUTEROL SULFATE HFA 108 (90 BASE) MCG/ACT IN AERS
2.0000 | INHALATION_SPRAY | Freq: Four times a day (QID) | RESPIRATORY_TRACT | Status: DC | PRN
  Filled 2018-11-03: qty 6.7

## 2018-11-03 MED ORDER — VITAMIN C 500 MG PO TABS
500.0000 mg | ORAL_TABLET | Freq: Two times a day (BID) | ORAL | Status: DC
  Administered 2018-11-03 – 2018-11-11 (×17): 500 mg via ORAL
  Filled 2018-11-03 (×17): qty 1

## 2018-11-03 NOTE — Progress Notes (Signed)
Report called to [place] at [facility], patient in stable condition

## 2018-11-03 NOTE — Progress Notes (Addendum)
Patient Demographics:    Mike Briggs, is a 85 y.o. male, DOB - 12-Jan-1929, [id number]  Admit date - 11/02/2018   Admitting Physician Kathie Dike, MD  Outpatient Primary MD for the patient is Rosita Fire, MD  LOS - 0   Chief Complaint  Patient presents with  . Weakness        Subjective:    Mike Briggs today has no further fevers, no emesis,  No chest pain, complains of fatigue, appetite is fair, dysuria resolving, cough remains nonproductive  Assessment  & Plan :    Principal Problem:   UTI (urinary tract infection) Active Problems:   COVID-19 virus infection   Gram-positive bacteremia   Type 2 diabetes mellitus with diabetic chronic kidney disease (HCC)   Chronic kidney disease, stage III (moderate) (HCC)   Essential hypertension   Rhabdomyolysis   Vascular dementia (HCC)   BPH (benign prostatic hyperplasia)   HLD (hyperlipidemia)   Dehydration   COVID-19 virus detected  Brief Summary:- 85 y.o. male with medical history significant for CKD III, HTN, HLD who is wheelchair-bound.  Patient lives independently at an [place] and usually has a Music therapist for up to 8 hours a day.  -Admitted 11/02/2018 with concerns of a UTI after presenting with fevers, generalized weakness, fatigue nonproductive cough and dysuria -ACE COVID-19 test came back positive on 11/03/2018 (the send out test was obtained on 11/02/2018)  -as of 11/03/2018 -Unsafe discharge plan--- PTA patient lived in an apartment with nurse aide providing about 8 hours of care each day.  However with COVID-19 positive test there is difficulty with securing an aide to provide care for patient at home.  At baseline patient is wheelchair-bound and is not independent with ADLs especially mobility related ADLs -Transfer to [facility]--for further monitoring of COVID-19 and UTI infections while awaiting further  disposition/discharge planning   A/p 1)COVID 19 Positive----PTA patient had fever and nonproductive cough along with fatigue malaise and generalized weakness  -patient had a send out test done on 11/02/2018 reported on 11/03/2018 as positive.  PTA did have a cough and fevers.  He has been afebrile since admission.  Chest x-ray from 11/02/2018 without acute findings and Patient without hypoxia-he does not meet criteria for Decadron or remdesivir -WBCs 4.6 -Assess for ability to lay  prone positioning for More than 16 hours/day in increments of 2 to 3 hours at a time if able to tolerate --Attempt to maintain euvolemic state --Zinc and vitamin C as ordered -Albuterol inhaler as needed ---Check and trend fibrinogen, CRP, pro calcitonin, CBC, BMP, d-dimer, LDH, ferritin and LFTs --Supplemental oxygen to keep O2 sats above 93% -Follow serial chest x-rays and ABGs as indicated  2)GPC Bacteremia and UTI-- dysuria is improving, continue IV Rocephin pending final blood and urine culture results -Repeat urine cx ordered...Marland KitchenMarland Kitchenas prior urine cx was "contaminated".....   3)Elevated CK--- CK trending up currently 536, was 518 on admission -Hold Lipitor  4)AKI----acute kidney injury on CKD stage - III, worsening renal function due to dehydration and presumed UTI    creatinine on admission= 1.53 ,   baseline creatinine =1.3 (02/28/2018)    , creatinine is now= 1.22      , renally adjust medications, avoid nephrotoxic agents /  dehydration /hypotension -Hold lisinopril -Renal function improving with hydration and treatment of UTI  5)DM2- Hyperglycemia noted, last A1c 7.2 in January 2020, Use Novolog/Humalog Sliding scale insulin with Accu-Cheks/Fingersticks as ordered  6)HTN-stable, continue Toprol-XL 25 mg daily  7)BPH with LUTs--- now with UTI, continue Flomax 0.4 mg daily  Disposition/Need for in-Hospital Stay- patient unable to be discharged at this time due to [facility] Bacteremia and UTI requiring IV  antibiotics pending Final blood and urineculture results -Unsafe discharge plan--- PTA patient lived in an apartment with nurse aide providing about 8 hours of care each day.  However with COVID-19 positive test there is difficulty with securing an aide to provide care for patient at home.  At baseline patient is wheelchair-bound and is not independent with ADLs especially mobility related ADLs -Transfer to [facility]--for further monitoring of COVID-19 and UTI infections while awaiting further disposition/discharge planning  Code Status : Full  Family Communication:   (patient is alert, awake and coherent) -With patient's permission--I called and updated his POA- Ms Soloman Dube [phone])  Disposition Plan  : see above  Consults  :  SW  DVT Prophylaxis  :  Lovenox - - SCDs    Lab Results  Component Value Date   PLT 145 (L) 11/03/2018   Inpatient Medications  Scheduled Meds: . colchicine  0.6 mg Oral q morning - 10a  . enoxaparin (LOVENOX) injection  45 mg Subcutaneous Q24H  . guaiFENesin  600 mg Oral BID  . memantine  10 mg Oral BID  . metoprolol succinate  25 mg Oral q morning - 10a  . polyethylene glycol  17 g Oral Daily  . senna-docusate  2 tablet Oral QHS  . tamsulosin  0.4 mg Oral QPC breakfast  . vitamin C  500 mg Oral BID  . zinc sulfate  220 mg Oral Daily   Continuous Infusions: . sodium chloride 50 mL/hr at 11/03/18 1316  . cefTRIAXone (ROCEPHIN)  IV 1 g (11/03/18 1319)   PRN Meds:.acetaminophen **OR** acetaminophen, albuterol, ondansetron **OR** ondansetron (ZOFRAN) IV   Anti-infectives (From admission, onward)   Start     Dose/Rate Route Frequency Ordered Stop   11/03/18 1600  cefTRIAXone (ROCEPHIN) 1 g in sodium chloride 0.9 % 100 mL IVPB  Status:  Discontinued     1 g 200 mL/hr over 30 Minutes Intravenous Every 24 hours 11/02/18 1647 11/02/18 1713   11/02/18 1400  cefTRIAXone (ROCEPHIN) 1 g in sodium chloride 0.9 % 100 mL IVPB     1 g 200 mL/hr  over 30 Minutes Intravenous Every 24 hours 11/02/18 1354         Objective:   Vitals:   11/02/18 1600 11/02/18 1648 11/02/18 2108 11/03/18 0502  BP: (!) 171/93 (!) 168/81 (!) 154/89 (!) 165/68  Pulse: 84 80 76 85  Resp: 20 18 16 16   Temp:  97.9 F (36.6 C) 98.9 F (37.2 C) 98.2 F (36.8 C)  TempSrc:  Oral Oral   SpO2: 98% 98% 100% 100%  Weight:      Height:  5\' 11"  (1.803 m)      Wt Readings from Last 3 Encounters:  11/02/18 90.7 kg  02/25/18 97.3 kg  01/10/18 111 kg    Intake/Output Summary (Last 24 hours) at 11/03/2018 1357 Last data filed at 11/03/2018 0900 Gross per 24 hour  Intake 4149.01 ml  Output 650 ml  Net 3499.01 ml   Physical Exam Gen:- Awake Alert,  In no apparent distress  HEENT:- [place].AT,  No sclera icterus Neck-Supple Neck,No JVD,.  Lungs-  CTAB , fair symmetrical air movement CV- S1, S2 normal, regular  Abd-  +ve B.Sounds, Abd Soft, No tenderness,    Extremity/Skin:- No  edema, pedal pulses present  Psych-affect is appropriate, oriented x3 Neuro-generalized weakness, no new focal deficits, no tremors GU-condom catheter with clear urine   Data Review:   Micro Results Recent Results (from the past 240 hour(s))  Urine Culture     Status: None   Collection Time: 11/02/18 12:21 PM   Specimen: Urine, Random  Result Value Ref Range Status   Specimen Description   Final    URINE, RANDOM Performed at [facility] [address], [place], [place] [address]    Special Requests   Final    NONE Performed at [facility], [address], [place], [place] [address]    Culture   Final    Multiple bacterial morphotypes present, none predominant. Suggest appropriate recollection if clinically indicated.   Report Status 11/03/2018 FINAL  Final  SARS CORONAVIRUS 2 (TAT 6-24 HRS) Nasopharyngeal Urine, Clean Catch     Status: Abnormal   Collection Time: 11/02/18 12:21 PM   Specimen: Urine, Clean Catch; Nasopharyngeal  Result Value Ref Range Status   SARS  Coronavirus 2 POSITIVE (A) NEGATIVE Final    Comment: (NOTE) SARS-CoV-2 target nucleic acids are DETECTED. The SARS-CoV-2 RNA is generally detectable in upper and lower respiratory specimens during the acute phase of infection. Positive results are indicative of active infection with SARS-CoV-2. Clinical  correlation with patient history and other diagnostic information is necessary to determine patient infection status. Positive results do  not rule out bacterial infection or co-infection with other viruses. The expected result is Negative. Fact Sheet for Patients: SugarRoll.be Fact Sheet for Healthcare Providers: https://www.woods-mathews.com/ This test is not yet approved or cleared by the [place] FDA and  has been authorized for detection and/or diagnosis of SARS-CoV-2 by FDA under an Emergency Use Authorization (EUA). This EUA will remain  in effect (meaning this test can be used) for the duration of the COVID-19 declaration under Section 564(b)(1) of the Act, 21 U.S.C.  section 360bbb-3(b)(1), unless the authorization is terminated or revoked sooner. Performed at [facility], [place] [address] [place], [place] [address]   Blood Culture (routine x 2)     Status: None (Preliminary result)   Collection Time: 11/02/18  2:31 PM   Specimen: Right Antecubital; Blood  Result Value Ref Range Status   Specimen Description RIGHT ANTECUBITAL  Final   Special Requests   Final    BOTTLES DRAWN AEROBIC AND ANAEROBIC Blood Culture adequate volume   Culture  Setup Time   Final    GRAM POSITIVE COCCI Gram Stain Report Called to,Read Back By and Verified With: HOWERTON M. AT 1322 ON 100520 BY THOMPSON S. AEROBIC BOTTLE ONLY Performed at [facility], [address] [place], [place] [address]    Culture PENDING  Incomplete   Report Status PENDING  Incomplete  Blood Culture (routine x 2)     Status: None (Preliminary result)   Collection  Time: 11/02/18  2:40 PM   Specimen: BLOOD LEFT HAND  Result Value Ref Range Status   Specimen Description BLOOD LEFT HAND  Final   Special Requests   Final    BOTTLES DRAWN AEROBIC ONLY Blood Culture results may not be optimal due to an inadequate volume of blood received in culture bottles   Culture   Final  NO GROWTH < 24 HOURS Performed at [facility], [address] [place], [place] [address]    Report Status PENDING  Incomplete    Radiology Reports Dg Chest Portable 1 View  Result Date: 11/02/2018 CLINICAL DATA:  Cough, increased weakness EXAM: PORTABLE CHEST 1 VIEW COMPARISON:  02/25/2018, 10/20/2012 FINDINGS: The heart size and mediastinal contours are within normal limits. Underpenetrated AP portable examination without acute appearing abnormality of the lungs. The visualized skeletal structures are unremarkable. IMPRESSION: Underpenetrated AP portable examination without acute appearing abnormality of the lungs. Electronically Signed   By: Eddie Candle M.D.   On: 11/02/2018 13:08     CBC Recent Labs  Lab 11/02/18 1221 11/03/18 0517  WBC 6.5 4.6  HGB 13.3 12.0*  HCT 43.0 37.3*  PLT 169 145*  MCV 97.9 96.1  MCH 30.3 30.9  MCHC 30.9 32.2  RDW 12.0 12.0  LYMPHSABS 1.3  --   MONOABS 0.8  --   EOSABS 0.0  --   BASOSABS 0.0  --     Chemistries  Recent Labs  Lab 11/02/18 1221 11/03/18 0517  NA 135 136  K 3.7 3.5  CL 100 106  CO2 25 21*  GLUCOSE 222* 182*  BUN 28* 25*  CREATININE 1.53* 1.22  CALCIUM 8.3* 7.3*  AST 34  --   ALT 19  --   ALKPHOS 138*  --   BILITOT 0.9  --    ------------------------------------------------------------------------------------------------------------------ No results for input(s): CHOL, HDL, LDLCALC, TRIG, CHOLHDL, LDLDIRECT in the last 72 hours.  Lab Results  Component Value Date   HGBA1C 7.2 (H) 02/26/2018    ------------------------------------------------------------------------------------------------------------------ No results for input(s): TSH, T4TOTAL, T3FREE, THYROIDAB in the last 72 hours.  Invalid input(s): FREET3 ------------------------------------------------------------------------------------------------------------------ No results for input(s): VITAMINB12, FOLATE, FERRITIN, TIBC, IRON, RETICCTPCT in the last 72 hours.  Coagulation profile Recent Labs  Lab 11/02/18 1221  INR 1.0    No results for input(s): DDIMER in the last 72 hours.  Cardiac Enzymes No results for input(s): CKMB, TROPONINI, MYOGLOBIN in the last 168 hours.  Invalid input(s): CK ------------------------------------------------------------------------------------------------------------------ No results found for: BNP   Roxan Hockey M.D on 11/03/2018 at 1:57 PM  Go to [email or web address] - for contact info  [facility] - Office  [phone]

## 2018-11-04 DIAGNOSIS — N390 Urinary tract infection, site not specified: Secondary | ICD-10-CM

## 2018-11-04 LAB — HEMOGLOBIN A1C
Hgb A1c MFr Bld: 9 % — ABNORMAL HIGH (ref 4.8–5.6)
Mean Plasma Glucose: 211.6 mg/dL

## 2018-11-04 LAB — URINE CULTURE: Special Requests: NORMAL

## 2018-11-04 LAB — CULTURE, BLOOD (ROUTINE X 2): Special Requests: ADEQUATE

## 2018-11-04 LAB — GLUCOSE, CAPILLARY
Glucose-Capillary: 169 mg/dL — ABNORMAL HIGH (ref 70–99)
Glucose-Capillary: 162 mg/dL — ABNORMAL HIGH (ref 70–99)

## 2018-11-04 MED ORDER — ENOXAPARIN SODIUM 40 MG/0.4ML ~~LOC~~ SOLN
40.0000 mg | SUBCUTANEOUS | Status: DC
  Administered 2018-11-04 – 2018-11-10 (×7): 40 mg via SUBCUTANEOUS
  Filled 2018-11-04 (×7): qty 0.4

## 2018-11-04 MED ORDER — INSULIN ASPART 100 UNIT/ML ~~LOC~~ SOLN
0.0000 [IU] | Freq: Three times a day (TID) | SUBCUTANEOUS | Status: DC
  Administered 2018-11-04: 2 [IU] via SUBCUTANEOUS
  Administered 2018-11-06: 1 [IU] via SUBCUTANEOUS
  Administered 2018-11-06: 2 [IU] via SUBCUTANEOUS
  Administered 2018-11-07: 7 [IU] via SUBCUTANEOUS
  Administered 2018-11-07: 5 [IU] via SUBCUTANEOUS
  Administered 2018-11-07: 2 [IU] via SUBCUTANEOUS
  Administered 2018-11-08: 5 [IU] via SUBCUTANEOUS
  Administered 2018-11-08: 3 [IU] via SUBCUTANEOUS
  Administered 2018-11-08: 1 [IU] via SUBCUTANEOUS
  Administered 2018-11-09: 2 [IU] via SUBCUTANEOUS
  Administered 2018-11-09: 7 [IU] via SUBCUTANEOUS
  Administered 2018-11-09 – 2018-11-10 (×2): 1 [IU] via SUBCUTANEOUS
  Administered 2018-11-10: 2 [IU] via SUBCUTANEOUS
  Administered 2018-11-10: 5 [IU] via SUBCUTANEOUS
  Administered 2018-11-11 (×2): 2 [IU] via SUBCUTANEOUS

## 2018-11-04 MED ORDER — INSULIN ASPART 100 UNIT/ML ~~LOC~~ SOLN
0.0000 [IU] | Freq: Every day | SUBCUTANEOUS | Status: DC
  Administered 2018-11-05 – 2018-11-10 (×3): 2 [IU] via SUBCUTANEOUS

## 2018-11-04 NOTE — Progress Notes (Signed)
PROGRESS NOTE    Mike Briggs  [id number] DOB: 1928-06-24 DOA: 11/02/2018 PCP: Rosita Fire, MD   Brief Narrative:  85 y.o.malewith medical history significant for CKD III, HTN, HLD who is wheelchair-bound. Patient lives independently at an [place] and usually has a Music therapist for up to 8 hours a day.  -Admitted 11/02/2018 with concerns of a UTI after presenting with fevers, generalized weakness, fatigue nonproductive cough and dysuria. -[facility] COVID-19 test came back positive on 11/03/2018 and 1 of his blood culture also was growing GPC.  He was transferred to [facility] for that.  Assessment & Plan:   Principal Problem:   UTI (urinary tract infection) Active Problems:   Type 2 diabetes mellitus with diabetic chronic kidney disease (HCC)   Chronic kidney disease, stage III (moderate) (HCC)   Vascular dementia ([place])   Essential hypertension   BPH (benign prostatic hyperplasia)   HLD (hyperlipidemia)   Dehydration   Rhabdomyolysis   COVID-19 virus infection   COVID-19 virus detected   Gram-positive bacteremia   UTI/GPC bacteremia: No more urinary symptoms.  Urine culture remain negative.  Only 1 bottle of blood cultures growing GPC, could be coagulase-negative and contamination.  Continue Rocephin IV for UTI treatment.  Follow final results of culture.  COVID-19 infection: No symptoms regarding this.  No hypoxia or chest x-ray findings.  Had low-grade fever of 101.4 last night.  Monitor for now.  CKD stage III: At baseline.  Continue to watch.  Prior notes have been documenting rhabdomyolysis however his CK was only minimally elevated, not sure if he meets criteria for rhabdomyolysis.  Has no muscle pain.  No history of trauma.  Type 2 diabetes mellitus: Seems to be diet controlled.  Is not on any medications.  Last hemoglobin A1c 7.2 in January.  Will start on SSI.    Essential hypertension: Controlled.  Continue current regimen.  DVT prophylaxis: Lovenox Code  Status: Full code Family Communication:  None present at bedside.  Plan of care discussed with patient in length and he verbalized understanding and agreed with it. Disposition Plan: TBD.  No safe plan yet.  Had home health aide which was coming to see him but due to being "positive, there will be no home health aide for him.  Consultants:   None  Procedures:   None  Antimicrobials:   IV Rocephin   Subjective: Patient seen and examined.  He is alert and oriented.  He has no complaints.  Objective: Vitals:   11/04/18 0019 11/04/18 0312 11/04/18 0800 11/04/18 1200  BP: (!) 159/71  (!) 148/68 (!) 142/68  Pulse: 88  82 80  Resp: 18   16  Temp: (!) 100.8 F (38.2 C) (!) 101.4 F (38.6 C) (!) 100.9 F (38.3 C) 99.4 F (37.4 C)  TempSrc: Oral Oral Oral Oral  SpO2: 98%  99% 97%  Weight:      Height:        Intake/Output Summary (Last 24 hours) at 11/04/2018 1419 Last data filed at 11/03/2018 1636 Gross per 24 hour  Intake 984.77 ml  Output 700 ml  Net 284.77 ml   Filed Weights   11/02/18 1146  Weight: 90.7 kg    Examination:  General exam: Appears calm and comfortable  Respiratory system: Clear to auscultation. Respiratory effort normal. Cardiovascular system: S1 & S2 heard, RRR. No JVD, murmurs, rubs, gallops or clicks. No pedal edema. Gastrointestinal system: Abdomen is nondistended, soft and nontender. No organomegaly or masses felt. Normal bowel sounds  heard. Central nervous system: Alert and oriented. No focal neurological deficits. Extremities: Symmetric 5 x 5 power. Skin: No rashes, lesions or ulcers Psychiatry: Judgement and insight appear normal. Mood & affect appropriate.    Data Reviewed: I have personally reviewed following labs and imaging studies  CBC: Recent Labs  Lab 11/02/18 1221 11/03/18 0517  WBC 6.5 4.6  NEUTROABS 4.4  --   HGB 13.3 12.0*  HCT 43.0 37.3*  MCV 97.9 96.1  PLT 169 [id number]*   Basic Metabolic Panel: Recent Labs  Lab  11/02/18 1221 11/03/18 0517  NA 135 136  K 3.7 3.5  CL 100 106  CO2 25 21*  GLUCOSE 222* 182*  BUN 28* 25*  CREATININE 1.53* 1.22  CALCIUM 8.3* 7.3*   GFR: Estimated Creatinine Clearance: 48.2 mL/min (by C-G formula based on SCr of 1.22 mg/dL). Liver Function Tests: Recent Labs  Lab 11/02/18 1221  AST 34  ALT 19  ALKPHOS 138*  BILITOT 0.9  PROT 7.3  ALBUMIN 3.3*   No results for input(s): LIPASE, AMYLASE in the last 168 hours. No results for input(s): AMMONIA in the last 168 hours. Coagulation Profile: Recent Labs  Lab 11/02/18 1221  INR 1.0   Cardiac Enzymes: Recent Labs  Lab 11/02/18 1221 11/03/18 0517  CKTOTAL 518* 536*   BNP (last 3 results) No results for input(s): PROBNP in the last 8760 hours. HbA1C: No results for input(s): HGBA1C in the last 72 hours. CBG: No results for input(s): GLUCAP in the last 168 hours. Lipid Profile: No results for input(s): CHOL, HDL, LDLCALC, TRIG, CHOLHDL, LDLDIRECT in the last 72 hours. Thyroid Function Tests: No results for input(s): TSH, T4TOTAL, FREET4, T3FREE, THYROIDAB in the last 72 hours. Anemia Panel: Recent Labs    11/03/18 1330  FERRITIN 216   Sepsis Labs: Recent Labs  Lab 11/02/18 1221 11/02/18 1431 11/03/18 1330  PROCALCITON  --   --  <0.10  LATICACIDVEN 2.4* 1.3  --     Recent Results (from the past 240 hour(s))  Urine Culture     Status: None   Collection Time: 11/02/18 12:21 PM   Specimen: Urine, Random  Result Value Ref Range Status   Specimen Description   Final    URINE, RANDOM Performed at [facility], [address] [place], [place] [address]    Special Requests   Final    NONE Performed at [facility], [address] [place], [place] [address]    Culture   Final    Multiple bacterial morphotypes present, none predominant. Suggest appropriate recollection if clinically indicated.   Report Status 11/03/2018 FINAL  Final  SARS CORONAVIRUS 2 (TAT 6-24 HRS) Nasopharyngeal Urine,  Clean Catch     Status: Abnormal   Collection Time: 11/02/18 12:21 PM   Specimen: Urine, Clean Catch; Nasopharyngeal  Result Value Ref Range Status   SARS Coronavirus 2 POSITIVE (A) NEGATIVE Final    Comment: (NOTE) SARS-CoV-2 target nucleic acids are DETECTED. The SARS-CoV-2 RNA is generally detectable in upper and lower respiratory specimens during the acute phase of infection. Positive results are indicative of active infection with SARS-CoV-2. Clinical  correlation with patient history and other diagnostic information is necessary to determine patient infection status. Positive results do  not rule out bacterial infection or co-infection with other viruses. The expected result is Negative. Fact Sheet for Patients: SugarRoll.be Fact Sheet for Healthcare Providers: https://www.woods-mathews.com/ This test is not yet approved or cleared by the [place] and  has been authorized  for detection and/or diagnosis of SARS-CoV-2 by FDA under an Emergency Use Authorization (EUA). This EUA will remain  in effect (meaning this test can be used) for the duration of the COVID-19 declaration under Section 564(b)(1) of the Act, 21 U.S.C.  section 360bbb-3(b)(1), unless the authorization is terminated or revoked sooner. Performed at [facility], [place] [address] [place], [place] [address]   Blood Culture (routine x 2)     Status: Abnormal   Collection Time: 11/02/18  2:31 PM   Specimen: Right Antecubital; Blood  Result Value Ref Range Status   Specimen Description   Final    RIGHT ANTECUBITAL Performed at [facility], [address] [place], [place] [address]    Special Requests   Final    BOTTLES DRAWN AEROBIC AND ANAEROBIC Blood Culture adequate volume Performed at [facility], [address], [place], [place] [address]    Culture  Setup Time   Final    GRAM POSITIVE COCCI Gram Stain Report Called to,Read Back By and Verified With:  HOWERTON M. AT [id number] ON [id number] BY THOMPSON S. AEROBIC BOTTLE ONLY CRITICAL RESULT CALLED TO, READ BACK BY AND VERIFIED WITH: A AMBURN RN 11/03/18 2010 JDW    Culture (A)  Final    STAPHYLOCOCCUS SPECIES (COAGULASE NEGATIVE) THE SIGNIFICANCE OF ISOLATING THIS ORGANISM FROM A SINGLE SET OF BLOOD CULTURES WHEN MULTIPLE SETS ARE DRAWN IS UNCERTAIN. PLEASE NOTIFY THE MICROBIOLOGY DEPARTMENT WITHIN ONE WEEK IF SPECIATION AND SENSITIVITIES ARE REQUIRED. Performed at [facility], [place] [address], [place], [place] [address]    Report Status 11/04/2018 FINAL  Final  Blood Culture ID Panel (Reflexed)     Status: Abnormal   Collection Time: 11/02/18  2:31 PM  Result Value Ref Range Status   Enterococcus species NOT DETECTED NOT DETECTED Final   Listeria monocytogenes NOT DETECTED NOT DETECTED Final   Staphylococcus species DETECTED (A) NOT DETECTED Final    Comment: Methicillin (oxacillin) susceptible coagulase negative staphylococcus. Possible blood culture contaminant (unless isolated from more than one blood culture draw or clinical case suggests pathogenicity). No antibiotic treatment is indicated for blood  culture contaminants. CRITICAL RESULT CALLED TO, READ BACK BY AND VERIFIED WITH: A AMBURN RN 11/03/18 2010 JDW    Staphylococcus aureus (BCID) NOT DETECTED NOT DETECTED Final   Methicillin resistance NOT DETECTED NOT DETECTED Final   Streptococcus species NOT DETECTED NOT DETECTED Final   Streptococcus agalactiae NOT DETECTED NOT DETECTED Final   Streptococcus pneumoniae NOT DETECTED NOT DETECTED Final   Streptococcus pyogenes NOT DETECTED NOT DETECTED Final   Acinetobacter baumannii NOT DETECTED NOT DETECTED Final   Enterobacteriaceae species NOT DETECTED NOT DETECTED Final   Enterobacter cloacae complex NOT DETECTED NOT DETECTED Final   Escherichia coli NOT DETECTED NOT DETECTED Final   Klebsiella oxytoca NOT DETECTED NOT DETECTED Final   Klebsiella pneumoniae NOT DETECTED NOT  DETECTED Final   Proteus species NOT DETECTED NOT DETECTED Final   Serratia marcescens NOT DETECTED NOT DETECTED Final   Haemophilus influenzae NOT DETECTED NOT DETECTED Final   Neisseria meningitidis NOT DETECTED NOT DETECTED Final   Pseudomonas aeruginosa NOT DETECTED NOT DETECTED Final   Candida albicans NOT DETECTED NOT DETECTED Final   Candida glabrata NOT DETECTED NOT DETECTED Final   Candida krusei NOT DETECTED NOT DETECTED Final   Candida parapsilosis NOT DETECTED NOT DETECTED Final   Candida tropicalis NOT DETECTED NOT DETECTED Final    Comment: Performed at [facility], [place]  [address] [place], [place] [address]  Blood Culture (routine x 2)     Status: None (Preliminary result)   Collection Time: 11/02/18  2:40 PM   Specimen: BLOOD LEFT HAND  Result Value Ref Range Status   Specimen Description BLOOD LEFT HAND  Final   Special Requests   Final    BOTTLES DRAWN AEROBIC ONLY Blood Culture results may not be optimal due to an inadequate volume of blood received in culture bottles   Culture   Final    NO GROWTH < 24 HOURS Performed at [facility], [address] [place], [place] [address]    Report Status PENDING  Incomplete      Radiology Studies: No results found.  Scheduled Meds: . colchicine  0.6 mg Oral q morning - 10a  . enoxaparin (LOVENOX) injection  40 mg Subcutaneous Q24H  . guaiFENesin  600 mg Oral BID  . memantine  10 mg Oral BID  . metoprolol succinate  25 mg Oral q morning - 10a  . polyethylene glycol  17 g Oral Daily  . senna-docusate  2 tablet Oral QHS  . tamsulosin  0.4 mg Oral QPC breakfast  . vitamin C  500 mg Oral BID  . zinc sulfate  220 mg Oral Daily   Continuous Infusions: . sodium chloride 50 mL/hr at 11/03/18 1316  . cefTRIAXone (ROCEPHIN)  IV 1 g (11/03/18 1319)     LOS: 1 day   Time spent: 35 minutes   Darliss Cheney, MD [facility]  11/04/2018, 2:19 PM   To contact the attending provider between 7A-7P or the  covering provider during after hours 7P-7A, please log into the web site [email or web address] and use password [email or web address].

## 2018-11-05 LAB — GLUCOSE, CAPILLARY
Glucose-Capillary: 149 mg/dL — ABNORMAL HIGH (ref 70–99)
Glucose-Capillary: 219 mg/dL — ABNORMAL HIGH (ref 70–99)
Glucose-Capillary: 225 mg/dL — ABNORMAL HIGH (ref 70–99)
Glucose-Capillary: 217 mg/dL — ABNORMAL HIGH (ref 70–99)

## 2018-11-05 MED ORDER — LISINOPRIL 20 MG PO TABS
40.0000 mg | ORAL_TABLET | Freq: Every morning | ORAL | Status: DC
  Administered 2018-11-06 – 2018-11-11 (×6): 40 mg via ORAL
  Filled 2018-11-05: qty 1
  Filled 2018-11-05 (×5): qty 2

## 2018-11-05 NOTE — Plan of Care (Signed)
  Problem: Nutrition: Goal: Adequate nutrition will be maintained Outcome: Progressing   Problem: Pain Managment: Goal: General experience of comfort will improve Outcome: Progressing   Problem: Health Behavior/Discharge Planning: Goal: Ability to manage health-related needs will improve Outcome: Not Progressing   Problem: Education: Goal: Knowledge of General Education information will improve Description: Including pain rating scale, medication(s)/side effects and non-pharmacologic comfort measures Outcome: Adequate for Discharge

## 2018-11-05 NOTE — Progress Notes (Addendum)
PROGRESS NOTE    Mike Briggs  [id number] DOB: 02/02/1928 DOA: 11/02/2018 PCP: Rosita Fire, MD   Brief Narrative: Patient is 85 year old male with history of CKD stage III, hypertension, hyperlipidemia, wheelchair-bound, living at independent apartment with aid  who was admitted on 11/02/2018 with concerns for UTI after presenting with fevers, generalized weakness, fatigue, nonproductive cough, dysuria.  Covid-19 test came positive on 11/03/2018 and he was transferred to [facility].  Currently he is hemodynamically stable.  He does not have any symptoms of COVID.  Currently on room air.  Difficult discharge planning due to his living condition on the background of  covid infection.  PT/OT consulted today.  Social worker following.  Assessment & Plan:   Principal Problem:   UTI (urinary tract infection) Active Problems:   Type 2 diabetes mellitus with diabetic chronic kidney disease (HCC)   Chronic kidney disease, stage III (moderate) (HCC)   Vascular dementia ([place])   Essential hypertension   BPH (benign prostatic hyperplasia)   HLD (hyperlipidemia)   Dehydration   Rhabdomyolysis   COVID-19 virus infection   COVID-19 virus detected   Gram-positive bacteremia   COVID-19 infection : No symptoms.  Currently on room air.  No hypoxia or chest x-ray finding. afebrile today.  UTI: No more urinary symptoms.  Urine culture showed multiple organisms.  Currently on ceftriaxone.  1 of the blood culture showed coagulase-negative staph, most likely contaminant.  He will complete antibiotic course tomorrow.  CKD stage III: Currently at baseline.  Diabetes type 2: Hemoglobin A1c of 9 .  Not on any medication at home.  Currently on sliding scale insulin.  Will start on metformin on discharge.  Hypertension: Hypertensive today  Continue current regimen.           DVT prophylaxis: Lovenox Code Status: Full code Family Communication: None present at the bedside Disposition  Plan:  TBD.  No safe plan yet.  Had home health aide which was coming to see him but due to being covid positive, there will be no home health aide for him.  Skilled nursing facility is a  possibility.  Consulted PT/OT.  Social worker following   Consultants: None  Procedures: None  Antimicrobials:  Anti-infectives (From admission, onward)   Start     Dose/Rate Route Frequency Ordered Stop   11/03/18 1600  cefTRIAXone (ROCEPHIN) 1 g in sodium chloride 0.9 % 100 mL IVPB  Status:  Discontinued     1 g 200 mL/hr over 30 Minutes Intravenous Every 24 hours 11/02/18 1647 11/02/18 1713   11/02/18 1400  cefTRIAXone (ROCEPHIN) 1 g in sodium chloride 0.9 % 100 mL IVPB     1 g 200 mL/hr over 30 Minutes Intravenous Every 24 hours 11/02/18 1354 11/07/18 1359      Subjective:  Patient seen and examined the bedside this afternoon.  Currently hemodynamically stable.  Comfortable.  No chest pain or shortness of breath.  Denies any dysuria.  Objective: Vitals:   11/04/18 1200 11/04/18 2120 11/05/18 0119 11/05/18 0900  BP: (!) 142/68 (!) 173/86 (!) 155/71 (!) 174/92  Pulse: 80 79 73 79  Resp: 16  20   Temp: 99.4 F (37.4 C) 99.1 F (37.3 C) 98.3 F (36.8 C) 98.2 F (36.8 C)  TempSrc: Oral Oral  Oral  SpO2: 97% 100% 96% 96%  Weight:      Height:        Intake/Output Summary (Last 24 hours) at 11/05/2018 1532 Last data filed at 11/05/2018 0600  Gross per 24 hour  Intake 865.05 ml  Output 2450 ml  Net -1584.95 ml   Filed Weights   11/02/18 1146  Weight: 90.7 kg    Examination:  General exam: Obese, elderly gentleman  HEENT:Ear/Nose normal on gross exam Respiratory system: Bilateral equal air entry, normal vesicular breath sounds, no wheezes or crackles  Cardiovascular system: S1 & S2 heard, RRR. No JVD, murmurs, rubs, gallops or clicks. No pedal edema. Gastrointestinal system: Abdomen is nondistended, soft and nontender. No organomegaly or masses felt. Normal bowel sounds heard.  Central nervous system: Alert and oriented.  Extremities: No edema, no clubbing ,no cyanosis, Skin: No rashes, lesions or ulcers,no icterus ,no pallor    Data Reviewed: I have personally reviewed following labs and imaging studies  CBC: Recent Labs  Lab 11/02/18 1221 11/03/18 0517  WBC 6.5 4.6  NEUTROABS 4.4  --   HGB 13.3 12.0*  HCT 43.0 37.3*  MCV 97.9 96.1  PLT 169 [id number]*   Basic Metabolic Panel: Recent Labs  Lab 11/02/18 1221 11/03/18 0517  NA 135 136  K 3.7 3.5  CL 100 106  CO2 25 21*  GLUCOSE 222* 182*  BUN 28* 25*  CREATININE 1.53* 1.22  CALCIUM 8.3* 7.3*   GFR: Estimated Creatinine Clearance: 48.2 mL/min (by C-G formula based on SCr of 1.22 mg/dL). Liver Function Tests: Recent Labs  Lab 11/02/18 1221  AST 34  ALT 19  ALKPHOS 138*  BILITOT 0.9  PROT 7.3  ALBUMIN 3.3*   No results for input(s): LIPASE, AMYLASE in the last 168 hours. No results for input(s): AMMONIA in the last 168 hours. Coagulation Profile: Recent Labs  Lab 11/02/18 1221  INR 1.0   Cardiac Enzymes: Recent Labs  Lab 11/02/18 1221 11/03/18 0517  CKTOTAL 518* 536*   BNP (last 3 results) No results for input(s): PROBNP in the last 8760 hours. HbA1C: Recent Labs    11/04/18 1441  HGBA1C 9.0*   CBG: Recent Labs  Lab 11/04/18 1743 11/04/18 2115 11/05/18 0914 11/05/18 1229  GLUCAP 169* 162* 149* 219*   Lipid Profile: No results for input(s): CHOL, HDL, LDLCALC, TRIG, CHOLHDL, LDLDIRECT in the last 72 hours. Thyroid Function Tests: No results for input(s): TSH, T4TOTAL, FREET4, T3FREE, THYROIDAB in the last 72 hours. Anemia Panel: Recent Labs    11/03/18 1330  FERRITIN 216   Sepsis Labs: Recent Labs  Lab 11/02/18 1221 11/02/18 1431 11/03/18 1330  PROCALCITON  --   --  <0.10  LATICACIDVEN 2.4* 1.3  --     Recent Results (from the past 240 hour(s))  Urine Culture     Status: None   Collection Time: 11/02/18 12:21 PM   Specimen: Urine, Random  Result  Value Ref Range Status   Specimen Description   Final    URINE, RANDOM Performed at [facility], [address] [place], [place] [address]    Special Requests   Final    NONE Performed at [facility], [address], [place], [place] [address]    Culture   Final    Multiple bacterial morphotypes present, none predominant. Suggest appropriate recollection if clinically indicated.   Report Status 11/03/2018 FINAL  Final  SARS CORONAVIRUS 2 (TAT 6-24 HRS) Nasopharyngeal Urine, Clean Catch     Status: Abnormal   Collection Time: 11/02/18 12:21 PM   Specimen: Urine, Clean Catch; Nasopharyngeal  Result Value Ref Range Status   SARS Coronavirus 2 POSITIVE (A) NEGATIVE Final    Comment: (NOTE) SARS-CoV-2 target nucleic  acids are DETECTED. The SARS-CoV-2 RNA is generally detectable in upper and lower respiratory specimens during the acute phase of infection. Positive results are indicative of active infection with SARS-CoV-2. Clinical  correlation with patient history and other diagnostic information is necessary to determine patient infection status. Positive results do  not rule out bacterial infection or co-infection with other viruses. The expected result is Negative. Fact Sheet for Patients: SugarRoll.be Fact Sheet for Healthcare Providers: https://www.woods-mathews.com/ This test is not yet approved or cleared by the [place] FDA and  has been authorized for detection and/or diagnosis of SARS-CoV-2 by FDA under an Emergency Use Authorization (EUA). This EUA will remain  in effect (meaning this test can be used) for the duration of the COVID-19 declaration under Section 564(b)(1) of the Act, 21 U.S.C.  section 360bbb-3(b)(1), unless the authorization is terminated or revoked sooner. Performed at [facility], [place] [address] [place], [place] [address]   Blood Culture (routine x 2)     Status: Abnormal   Collection Time:  11/02/18  2:31 PM   Specimen: Right Antecubital; Blood  Result Value Ref Range Status   Specimen Description   Final    RIGHT ANTECUBITAL Performed at [facility], [address], [place], [place] [address]    Special Requests   Final    BOTTLES DRAWN AEROBIC AND ANAEROBIC Blood Culture adequate volume Performed at [facility], [address], [place], [place] [address]    Culture  Setup Time   Final    GRAM POSITIVE COCCI Gram Stain Report Called to,Read Back By and Verified With: HOWERTON M. AT [id number] ON [id number] BY THOMPSON S. AEROBIC BOTTLE ONLY CRITICAL RESULT CALLED TO, READ BACK BY AND VERIFIED WITH: A AMBURN RN 11/03/18 2010 JDW    Culture (A)  Final    STAPHYLOCOCCUS SPECIES (COAGULASE NEGATIVE) THE SIGNIFICANCE OF ISOLATING THIS ORGANISM FROM A SINGLE SET OF BLOOD CULTURES WHEN MULTIPLE SETS ARE DRAWN IS UNCERTAIN. PLEASE NOTIFY THE MICROBIOLOGY DEPARTMENT WITHIN ONE WEEK IF SPECIATION AND SENSITIVITIES ARE REQUIRED. Performed at [facility], [place] [address], [place], [place] [address]    Report Status 11/04/2018 FINAL  Final  Blood Culture ID Panel (Reflexed)     Status: Abnormal   Collection Time: 11/02/18  2:31 PM  Result Value Ref Range Status   Enterococcus species NOT DETECTED NOT DETECTED Final   Listeria monocytogenes NOT DETECTED NOT DETECTED Final   Staphylococcus species DETECTED (A) NOT DETECTED Final    Comment: Methicillin (oxacillin) susceptible coagulase negative staphylococcus. Possible blood culture contaminant (unless isolated from more than one blood culture draw or clinical case suggests pathogenicity). No antibiotic treatment is indicated for blood  culture contaminants. CRITICAL RESULT CALLED TO, READ BACK BY AND VERIFIED WITH: A AMBURN RN 11/03/18 2010 JDW    Staphylococcus aureus (BCID) NOT DETECTED NOT DETECTED Final   Methicillin resistance NOT DETECTED NOT DETECTED Final   Streptococcus species NOT DETECTED NOT DETECTED Final   Streptococcus  agalactiae NOT DETECTED NOT DETECTED Final   Streptococcus pneumoniae NOT DETECTED NOT DETECTED Final   Streptococcus pyogenes NOT DETECTED NOT DETECTED Final   Acinetobacter baumannii NOT DETECTED NOT DETECTED Final   Enterobacteriaceae species NOT DETECTED NOT DETECTED Final   Enterobacter cloacae complex NOT DETECTED NOT DETECTED Final   Escherichia coli NOT DETECTED NOT DETECTED Final   Klebsiella oxytoca NOT DETECTED NOT DETECTED Final   Klebsiella pneumoniae NOT DETECTED NOT DETECTED Final   Proteus species NOT DETECTED NOT DETECTED  Final   Serratia marcescens NOT DETECTED NOT DETECTED Final   Haemophilus influenzae NOT DETECTED NOT DETECTED Final   Neisseria meningitidis NOT DETECTED NOT DETECTED Final   Pseudomonas aeruginosa NOT DETECTED NOT DETECTED Final   Candida albicans NOT DETECTED NOT DETECTED Final   Candida glabrata NOT DETECTED NOT DETECTED Final   Candida krusei NOT DETECTED NOT DETECTED Final   Candida parapsilosis NOT DETECTED NOT DETECTED Final   Candida tropicalis NOT DETECTED NOT DETECTED Final    Comment: Performed at [facility], [place] [address] [place], [place] [address]  Blood Culture (routine x 2)     Status: None (Preliminary result)   Collection Time: 11/02/18  2:40 PM   Specimen: BLOOD LEFT HAND  Result Value Ref Range Status   Specimen Description BLOOD LEFT HAND  Final   Special Requests   Final    BOTTLES DRAWN AEROBIC ONLY Blood Culture results may not be optimal due to an inadequate volume of blood received in culture bottles   Culture   Final    NO GROWTH 3 DAYS Performed at [facility], [address] [place], [place] [address]    Report Status PENDING  Incomplete  Urine Culture     Status: Abnormal   Collection Time: 11/03/18  1:56 PM   Specimen: Urine, Catheterized  Result Value Ref Range Status   Specimen Description   Final    URINE, CATHETERIZED Performed at [facility] [address], [place], [place] [address]    Special  Requests   Final    Normal Performed at [facility], [address], [place], [place] [address]    Culture MULTIPLE SPECIES PRESENT, SUGGEST RECOLLECTION (A)  Final   Report Status 11/04/2018 FINAL  Final         Radiology Studies: No results found.      Scheduled Meds: . colchicine  0.6 mg Oral q morning - 10a  . enoxaparin (LOVENOX) injection  40 mg Subcutaneous Q24H  . guaiFENesin  600 mg Oral BID  . insulin aspart  0-5 Units Subcutaneous QHS  . insulin aspart  0-9 Units Subcutaneous TID WC  . memantine  10 mg Oral BID  . metoprolol succinate  25 mg Oral q morning - 10a  . polyethylene glycol  17 g Oral Daily  . senna-docusate  2 tablet Oral QHS  . tamsulosin  0.4 mg Oral QPC breakfast  . vitamin C  500 mg Oral BID  . zinc sulfate  220 mg Oral Daily   Continuous Infusions: . cefTRIAXone (ROCEPHIN)  IV Stopped (11/04/18 1900)     LOS: 2 days    Time spent: 35 mins.More than 50% of that time was spent in counseling and/or coordination of care.      Shelly Coss, MD [facility] Pager [phone]  If 7PM-7AM, please contact night-coverage [email or web address] Password [facility] 11/05/2018, 3:32 PM

## 2018-11-06 LAB — GLUCOSE, CAPILLARY
Glucose-Capillary: 129 mg/dL — ABNORMAL HIGH (ref 70–99)
Glucose-Capillary: 242 mg/dL — ABNORMAL HIGH (ref 70–99)
Glucose-Capillary: 164 mg/dL — ABNORMAL HIGH (ref 70–99)
Glucose-Capillary: 174 mg/dL — ABNORMAL HIGH (ref 70–99)

## 2018-11-06 MED ORDER — AMLODIPINE BESYLATE 5 MG PO TABS
10.0000 mg | ORAL_TABLET | Freq: Every day | ORAL | Status: DC
  Administered 2018-11-06 – 2018-11-11 (×6): 10 mg via ORAL
  Filled 2018-11-06 (×6): qty 2
  Filled 2018-11-06: qty 1

## 2018-11-06 MED ORDER — LABETALOL HCL 5 MG/ML IV SOLN
5.0000 mg | Freq: Once | INTRAVENOUS | Status: AC
  Administered 2018-11-06: 5 mg via INTRAVENOUS
  Filled 2018-11-06: qty 4

## 2018-11-06 NOTE — Progress Notes (Signed)
OT Cancellation Note  Patient Details Name: KEM ALESSI MRN: [id number] DOB: February 18, 1928   Cancelled Treatment:    Reason Eval/Treat Not Completed: Other (comment)(Pt being transferred to [facility] any minute. OT to eval pt there if applicable.  Ebony Hail Harold Hedge) Marsa Aris OTR/L Acute Rehabilitation Services Pager: [phone] Office: [place] 11/06/2018, 11:41 AM

## 2018-11-06 NOTE — Progress Notes (Signed)
   11/06/18 1630  Vitals  Temp 99.1 F (37.3 C)  Temp Source Oral  BP (!) 154/73  MAP (mmHg) 97  BP Location Left Arm  BP Method Automatic  Patient Position (if appropriate) Lying  Pulse Rate 83  Pulse Rate Source Monitor  Oxygen Therapy  SpO2 96 %  O2 Device Room Air  Pain Assessment  Pain Scale 0-10  Pain Score 0  Glasgow Coma Scale  Eye Opening 4  Best Verbal Response (NON-intubated) 5  Best Motor Response 6  [place] Coma Scale Score 15  MEWS Score  MEWS RR 0  MEWS Pulse 0  MEWS Systolic 0  MEWS LOC 0  MEWS Temp 0  MEWS Score 0  MEWS Score Color Green   Patient arrive to [facility] room 114 at this time. VS above.

## 2018-11-06 NOTE — Progress Notes (Signed)
   11/06/18 1630  Family/Significant Other Communication  Family/Significant Other Update Called;Updated

## 2018-11-06 NOTE — Progress Notes (Signed)
PROGRESS NOTE    Mike Briggs  [id number] DOB: March 16, 1928 DOA: 11/02/2018 PCP: Rosita Fire, MD   Brief Narrative: Patient is 85 year old male with history of CKD stage III, hypertension, hyperlipidemia, wheelchair-bound, living at independent apartment with aid  who was admitted on 11/02/2018 with concerns for UTI after presenting with fevers, generalized weakness, fatigue, nonproductive cough, dysuria.  Covid-19 test came positive on 11/03/2018 and he was transferred to [facility].  Currently he is hemodynamically stable.  He does not have any symptoms of COVID.  Currently on room air.  Difficult discharge planning due to his living condition on the background of  covid infection.  PT/OT consulted .  Social worker following.  Patient being transferred to [facility] today.  Assessment & Plan:   Principal Problem:   UTI (urinary tract infection) Active Problems:   Type 2 diabetes mellitus with diabetic chronic kidney disease (HCC)   Chronic kidney disease, stage III (moderate) (HCC)   Vascular dementia ([place])   Essential hypertension   BPH (benign prostatic hyperplasia)   HLD (hyperlipidemia)   Dehydration   Rhabdomyolysis   COVID-19 virus infection   COVID-19 virus detected   Gram-positive bacteremia   COVID-19 infection : No symptoms.  Currently on room air.  No hypoxia or chest x-ray finding. afebrile today.  UTI: No more urinary symptoms.  Urine culture showed multiple organisms.  Currently on ceftriaxone.  1 of the blood culture showed coagulase-negative staph, most likely contaminant.  He will complete antibiotic course today.  CKD stage III: Currently at baseline.  Diabetes type 2: Hemoglobin A1c of 9 .  Not on any medication at home.  Currently on sliding scale insulin.  Consider starting  metformin on discharge.  Hypertension: Hypertensive today . Continue current regimen.added amlodipine  Debility/deconditioning: Patient is nonambulatory,  wheelchair bound.  Needs aide at the time.  He has history of right-sided low back pain, sciatica, CVA, gait disturbance so has been unable to ambulate since last many years.           DVT prophylaxis: Lovenox Code Status: Full code Family Communication: Called niece on phone Disposition Plan:  TBD.  No safe plan yet.  Had home health aide which was coming to see him but due to being covid positive, there will be no home health aide for him.  Skilled nursing facility is a  possibility.  Consulted PT/OT.  Social worker following   Consultants: None  Procedures: None  Antimicrobials:  Anti-infectives (From admission, onward)   Start     Dose/Rate Route Frequency Ordered Stop   11/03/18 1600  cefTRIAXone (ROCEPHIN) 1 g in sodium chloride 0.9 % 100 mL IVPB  Status:  Discontinued     1 g 200 mL/hr over 30 Minutes Intravenous Every 24 hours 11/02/18 1647 11/02/18 1713   11/02/18 1400  cefTRIAXone (ROCEPHIN) 1 g in sodium chloride 0.9 % 100 mL IVPB     1 g 200 mL/hr over 30 Minutes Intravenous Every 24 hours 11/02/18 1354 11/07/18 1359      Subjective:  Patient seen and examined the bedside this morning.  Hemodynamically stable.  Eating his breakfast.  Denies any complaints.  Comfortable, on room air.  Objective: Vitals:   11/05/18 1800 11/05/18 2341 11/06/18 0645 11/06/18 0900  BP: (!) 173/79 (!) 190/89 (!) 171/81 (!) 174/88  Pulse: 84 83 89 92  Resp: 18     Temp: 99.2 F (37.3 C) 99.3 F (37.4 C) 98.8 F (37.1 C) 99.4 F (  37.4 C)  TempSrc: Oral Oral Oral Oral  SpO2: 97% 98% 98% 98%  Weight:      Height:        Intake/Output Summary (Last 24 hours) at 11/06/2018 1101 Last data filed at 11/05/2018 1500 Gross per 24 hour  Intake 300 ml  Output -  Net 300 ml   Filed Weights   11/02/18 1146  Weight: 90.7 kg    Examination:  General exam: Obese, elderly gentleman , deconditioned/debilitated HEENT:Ear/Nose normal on gross exam Respiratory system: Bilateral equal  air entry, normal vesicular breath sounds, no wheezes or crackles  Cardiovascular system: S1 & S2 heard, RRR. No JVD, murmurs, rubs, gallops or clicks. No pedal edema. Gastrointestinal system: Abdomen is nondistended, soft and nontender. No organomegaly or masses felt. Normal bowel sounds heard. Central nervous system: Alert and oriented.  Extremities: No edema, no clubbing ,no cyanosis, Skin: No rashes, lesions or ulcers,no icterus ,no pallor    Data Reviewed: I have personally reviewed following labs and imaging studies  CBC: Recent Labs  Lab 11/02/18 1221 11/03/18 0517  WBC 6.5 4.6  NEUTROABS 4.4  --   HGB 13.3 12.0*  HCT 43.0 37.3*  MCV 97.9 96.1  PLT 169 [id number]*   Basic Metabolic Panel: Recent Labs  Lab 11/02/18 1221 11/03/18 0517  NA 135 136  K 3.7 3.5  CL 100 106  CO2 25 21*  GLUCOSE 222* 182*  BUN 28* 25*  CREATININE 1.53* 1.22  CALCIUM 8.3* 7.3*   GFR: Estimated Creatinine Clearance: 48.2 mL/min (by C-G formula based on SCr of 1.22 mg/dL). Liver Function Tests: Recent Labs  Lab 11/02/18 1221  AST 34  ALT 19  ALKPHOS 138*  BILITOT 0.9  PROT 7.3  ALBUMIN 3.3*   No results for input(s): LIPASE, AMYLASE in the last 168 hours. No results for input(s): AMMONIA in the last 168 hours. Coagulation Profile: Recent Labs  Lab 11/02/18 1221  INR 1.0   Cardiac Enzymes: Recent Labs  Lab 11/02/18 1221 11/03/18 0517  CKTOTAL 518* 536*   BNP (last 3 results) No results for input(s): PROBNP in the last 8760 hours. HbA1C: Recent Labs    11/04/18 1441  HGBA1C 9.0*   CBG: Recent Labs  Lab 11/05/18 0914 11/05/18 1229 11/05/18 1828 11/05/18 2118 11/06/18 0940  GLUCAP 149* 219* 225* 217* 129*   Lipid Profile: No results for input(s): CHOL, HDL, LDLCALC, TRIG, CHOLHDL, LDLDIRECT in the last 72 hours. Thyroid Function Tests: No results for input(s): TSH, T4TOTAL, FREET4, T3FREE, THYROIDAB in the last 72 hours. Anemia Panel: Recent Labs    11/03/18  1330  FERRITIN 216   Sepsis Labs: Recent Labs  Lab 11/02/18 1221 11/02/18 1431 11/03/18 1330  PROCALCITON  --   --  <0.10  LATICACIDVEN 2.4* 1.3  --     Recent Results (from the past 240 hour(s))  Urine Culture     Status: None   Collection Time: 11/02/18 12:21 PM   Specimen: Urine, Random  Result Value Ref Range Status   Specimen Description   Final    URINE, RANDOM Performed at [facility], [address] [place], [place] [address]    Special Requests   Final    NONE Performed at [facility], [address], [place],  [address]    Culture   Final    Multiple bacterial morphotypes present, none predominant. Suggest appropriate recollection if clinically indicated.   Report Status 11/03/2018 FINAL  Final  SARS CORONAVIRUS 2 (TAT 6-24 HRS) Nasopharyngeal  Urine, Clean Catch     Status: Abnormal   Collection Time: 11/02/18 12:21 PM   Specimen: Urine, Clean Catch; Nasopharyngeal  Result Value Ref Range Status   SARS Coronavirus 2 POSITIVE (A) NEGATIVE Final    Comment: (NOTE) SARS-CoV-2 target nucleic acids are DETECTED. The SARS-CoV-2 RNA is generally detectable in upper and lower respiratory specimens during the acute phase of infection. Positive results are indicative of active infection with SARS-CoV-2. Clinical  correlation with patient history and other diagnostic information is necessary to determine patient infection status. Positive results do  not rule out bacterial infection or co-infection with other viruses. The expected result is Negative. Fact Sheet for Patients: SugarRoll.be Fact Sheet for Healthcare Providers: https://www.woods-mathews.com/ This test is not yet approved or cleared by the [place] FDA and  has been authorized for detection and/or diagnosis of SARS-CoV-2 by FDA under an Emergency Use Authorization (EUA). This EUA will remain  in effect (meaning this test can be used) for the duration of  the COVID-19 declaration under Section 564(b)(1) of the Act, 21 U.S.C.  section 360bbb-3(b)(1), unless the authorization is terminated or revoked sooner. Performed at [facility], [place] [address], [place], [place] [address]   Blood Culture (routine x 2)     Status: Abnormal   Collection Time: 11/02/18  2:31 PM   Specimen: Right Antecubital; Blood  Result Value Ref Range Status   Specimen Description   Final    RIGHT ANTECUBITAL Performed at [facility], [address], [place], [place] [address]    Special Requests   Final    BOTTLES DRAWN AEROBIC AND ANAEROBIC Blood Culture adequate volume Performed at [facility], [address] [place], [place] [address]    Culture  Setup Time   Final    GRAM POSITIVE COCCI Gram Stain Report Called to,Read Back By and Verified With: HOWERTON M. AT [id number] ON [id number] BY THOMPSON S. AEROBIC BOTTLE ONLY CRITICAL RESULT CALLED TO, READ BACK BY AND VERIFIED WITH: A AMBURN RN 11/03/18 2010 JDW    Culture (A)  Final    STAPHYLOCOCCUS SPECIES (COAGULASE NEGATIVE) THE SIGNIFICANCE OF ISOLATING THIS ORGANISM FROM A SINGLE SET OF BLOOD CULTURES WHEN MULTIPLE SETS ARE DRAWN IS UNCERTAIN. PLEASE NOTIFY THE MICROBIOLOGY DEPARTMENT WITHIN ONE WEEK IF SPECIATION AND SENSITIVITIES ARE REQUIRED. Performed at [facility], [place] [address] [place], [place] [address]    Report Status 11/04/2018 FINAL  Final  Blood Culture ID Panel (Reflexed)     Status: Abnormal   Collection Time: 11/02/18  2:31 PM  Result Value Ref Range Status   Enterococcus species NOT DETECTED NOT DETECTED Final   Listeria monocytogenes NOT DETECTED NOT DETECTED Final   Staphylococcus species DETECTED (A) NOT DETECTED Final    Comment: Methicillin (oxacillin) susceptible coagulase negative staphylococcus. Possible blood culture contaminant (unless isolated from more than one blood culture draw or clinical case suggests pathogenicity). No antibiotic treatment is indicated for blood   culture contaminants. CRITICAL RESULT CALLED TO, READ BACK BY AND VERIFIED WITH: A AMBURN RN 11/03/18 2010 JDW    Staphylococcus aureus (BCID) NOT DETECTED NOT DETECTED Final   Methicillin resistance NOT DETECTED NOT DETECTED Final   Streptococcus species NOT DETECTED NOT DETECTED Final   Streptococcus agalactiae NOT DETECTED NOT DETECTED Final   Streptococcus pneumoniae NOT DETECTED NOT DETECTED Final   Streptococcus pyogenes NOT DETECTED NOT DETECTED Final   Acinetobacter baumannii NOT DETECTED NOT DETECTED Final   Enterobacteriaceae species NOT DETECTED NOT DETECTED  Final   Enterobacter cloacae complex NOT DETECTED NOT DETECTED Final   Escherichia coli NOT DETECTED NOT DETECTED Final   Klebsiella oxytoca NOT DETECTED NOT DETECTED Final   Klebsiella pneumoniae NOT DETECTED NOT DETECTED Final   Proteus species NOT DETECTED NOT DETECTED Final   Serratia marcescens NOT DETECTED NOT DETECTED Final   Haemophilus influenzae NOT DETECTED NOT DETECTED Final   Neisseria meningitidis NOT DETECTED NOT DETECTED Final   Pseudomonas aeruginosa NOT DETECTED NOT DETECTED Final   Candida albicans NOT DETECTED NOT DETECTED Final   Candida glabrata NOT DETECTED NOT DETECTED Final   Candida krusei NOT DETECTED NOT DETECTED Final   Candida parapsilosis NOT DETECTED NOT DETECTED Final   Candida tropicalis NOT DETECTED NOT DETECTED Final    Comment: Performed at [facility], [place] [address] [place], [place] [address]  Blood Culture (routine x 2)     Status: None (Preliminary result)   Collection Time: 11/02/18  2:40 PM   Specimen: BLOOD LEFT HAND  Result Value Ref Range Status   Specimen Description BLOOD LEFT HAND  Final   Special Requests   Final    BOTTLES DRAWN AEROBIC ONLY Blood Culture results may not be optimal due to an inadequate volume of blood received in culture bottles   Culture   Final    NO GROWTH 4 DAYS Performed at [facility], [address] [place], [place] [address]     Report Status PENDING  Incomplete  Urine Culture     Status: Abnormal   Collection Time: 11/03/18  1:56 PM   Specimen: Urine, Catheterized  Result Value Ref Range Status   Specimen Description   Final    URINE, CATHETERIZED Performed at [facility], [address] [place], [place] [address]    Special Requests   Final    Normal Performed at [facility], [address], [place], [place] [address]    Culture MULTIPLE SPECIES PRESENT, SUGGEST RECOLLECTION (A)  Final   Report Status 11/04/2018 FINAL  Final         Radiology Studies: No results found.      Scheduled Meds: . amLODipine  10 mg Oral Daily  . colchicine  0.6 mg Oral q morning - 10a  . enoxaparin (LOVENOX) injection  40 mg Subcutaneous Q24H  . guaiFENesin  600 mg Oral BID  . insulin aspart  0-5 Units Subcutaneous QHS  . insulin aspart  0-9 Units Subcutaneous TID WC  . lisinopril  40 mg Oral q morning - 10a  . memantine  10 mg Oral BID  . metoprolol succinate  25 mg Oral q morning - 10a  . polyethylene glycol  17 g Oral Daily  . senna-docusate  2 tablet Oral QHS  . tamsulosin  0.4 mg Oral QPC breakfast  . vitamin C  500 mg Oral BID  . zinc sulfate  220 mg Oral Daily   Continuous Infusions: . cefTRIAXone (ROCEPHIN)  IV 1 g (11/05/18 1607)     LOS: 3 days    Time spent: 35 mins.More than 50% of that time was spent in counseling and/or coordination of care.      Shelly Coss, MD [facility] Pager [phone]  If 7PM-7AM, please contact night-coverage [email or web address] Password [email or web address] 11/06/2018, 11:01 AM

## 2018-11-06 NOTE — Progress Notes (Signed)
PT Cancellation Note  Patient Details Name: Mike Briggs MRN: [id number] DOB: Jun 08, 1928   Cancelled Treatment:    Reason Eval/Treat Not Completed: Other (comment). Pt transferring to [place]. Per OT/notes, transfer scheduled for 12:00. PT eval to be deferred to [place].    Lorriane Shire 11/06/2018, 1:20 PM   Lorrin Goodell, PT  Office # [phone] Pager [phone]

## 2018-11-07 DIAGNOSIS — E1122 Type 2 diabetes mellitus with diabetic chronic kidney disease: Secondary | ICD-10-CM

## 2018-11-07 DIAGNOSIS — N184 Chronic kidney disease, stage 4 (severe): Secondary | ICD-10-CM

## 2018-11-07 DIAGNOSIS — U071 COVID-19: Principal | ICD-10-CM

## 2018-11-07 LAB — GLUCOSE, CAPILLARY
Glucose-Capillary: 159 mg/dL — ABNORMAL HIGH (ref 70–99)
Glucose-Capillary: 278 mg/dL — ABNORMAL HIGH (ref 70–99)
Glucose-Capillary: 203 mg/dL — ABNORMAL HIGH (ref 70–99)

## 2018-11-07 LAB — CULTURE, BLOOD (ROUTINE X 2): Culture: NO GROWTH

## 2018-11-07 NOTE — Evaluation (Signed)
Physical Therapy Evaluation Patient Details Name: Mike Briggs MRN: [id number] DOB: Jul 22, 1928 Today's Date: 11/07/2018   History of Present Illness  85 y.o. male past medical history of chronic kidney disease stage III, essential hypertension wheelchair-bound lives at an dependent living facility admitted on 11/02/2018 fever generalized weakness fatigue nonproductive cough COVID-19 PCR was positive, admitted to [facility] on 11/03/2018.  He also had dysuria on admission  Clinical Impression   Pt admitted with above diagnosis. He was living home alone with aide to assist during day. Pt currently with functional limitations due to the deficits listed below (see PT Problem List). Pt will benefit from skilled PT to increase his overall strength, balance and coordination, activity tolerance,  independence and safety with mobility to allow discharge to the venue listed below.       Follow Up Recommendations SNF    Equipment Recommendations  None recommended by PT    Recommendations for Other Services       Precautions / Restrictions Precautions Precautions: Fall Restrictions Weight Bearing Restrictions: No      Mobility  Bed Mobility Overal bed mobility: Needs Assistance Bed Mobility: Supine to Sit     Supine to sit: Mod assist;+2 for physical assistance     General bed mobility comments: Diffiuclty moving legs off bed. Appears to use trapeze bar at home.   Transfers Overall transfer level: Needs assistance Equipment used: 2 person hand held assist Transfers: Squat Pivot Transfers     Squat pivot transfers: Max assist;+2 physical assistance     General transfer comment: Pt expressed fear of falling  Ambulation/Gait             General Gait Details: did not ambulate during asessment  Stairs            Wheelchair Mobility    Modified Rankin (Stroke Patients Only)       Balance Overall balance assessment: Needs assistance Sitting-balance support:  Feet supported;Bilateral upper extremity supported Sitting balance-Leahy Scale: Fair         Standing balance comment: 2+ therapist to maintain semi stand, does not stand fully in transfer                             Pertinent Vitals/Pain Pain Assessment: Faces Pain Score: 4  Faces Pain Scale: Hurts little more Pain Location: back; BLE Pain Descriptors / Indicators: Aching;Discomfort Pain Intervention(s): Limited activity within patient's tolerance;Monitored during session    Home Living Family/patient expects to be discharged to:: Private residence Living Arrangements: Alone;Other (Comment) Available Help at Discharge: Personal care attendant;Family;Available 24 hours/day Type of Home: Apartment Home Access: Level entry     Home Layout: One level Home Equipment: Walker - 2 wheels;Bedside commode;Shower seat;Grab bars - toilet;Grab bars - tub/shower;Electric scooter      Prior Function Level of [place]: Needs assistance   Gait / Transfers Assistance Needed: primarily uses scooter; ableto transfer self into scooter independently  ADL's / Homemaking Assistance Needed: Has HHA 8 hrs/day who assits with bathing and IADL tasks; pt able to take himself to the toilet        Hand Dominance   Dominant Hand: Right    Extremity/Trunk Assessment   Upper Extremity Assessment Upper Extremity Assessment: Defer to OT evaluation    Lower Extremity Assessment Lower Extremity Assessment: Generalized weakness    Cervical / Trunk Assessment Cervical / Trunk Assessment: Kyphotic  Communication   Communication: HOH  Cognition Arousal/Alertness: Awake/alert  Behavior During Therapy: WFL for tasks assessed/performed Overall Cognitive Status: No family/caregiver present to determine baseline cognitive functioning                                 General Comments: cognitive impariments at baseline; pt unaware why he was in the hospital but knew he was in  a hospital; increased time for processing and problem solving      General Comments General comments (skin integrity, edema, etc.): Pt seems to be confused, he states he knows he i in hospital and has virus but is perplexed as to how he got it. He give hx where he tells therapist he could perform most tasks at home but from assessment it does not seem as if he was able to complete all these tasks on his own, and if he was he was probably very unsafe. at one point he even mentioned if i dont have help with a movement " i go down". Family reports he lives alone and has aide come in to assist but a discrepancy exists between what pt does and can do when aide is not around. This am was able to get to EOB with mod-max a, was able to sit supported and complete squat pivot transfer from bed to recliner but needed 2 person assist to complete this. Pt is on room air throughout and sats do not drop under 90%.    Exercises General Exercises - Lower Extremity Ankle Circles/Pumps: 10 reps Long Arc Quad: 10 reps Hip Flexion/Marching: 10 reps   Assessment/Plan    PT Assessment Patient needs continued PT services(while in hospital)  PT Problem List Decreased strength;Decreased activity tolerance;Decreased balance;Decreased coordination;Decreased cognition;Decreased safety awareness       PT Treatment Interventions Functional mobility training;Therapeutic activities;Therapeutic exercise;Balance training;Neuromuscular re-education;Patient/family education    PT Goals (Current goals can be found in the Care Plan section)  Acute Rehab PT Goals Patient Stated Goal: to get better Time For Goal Achievement: 11/21/18 Potential to Achieve Goals: Fair    Frequency Min 2X/week   Barriers to discharge (lives home alone with decreased cognition)      Co-evaluation PT/OT/SLP Co-Evaluation/Treatment: Yes Reason for Co-Treatment: For patient/therapist safety;Complexity of the patient's impairments (multi-system  involvement) PT goals addressed during session: Mobility/safety with mobility OT goals addressed during session: ADL's and self-care       AM-PAC PT "6 Clicks" Mobility  Outcome Measure Help needed turning from your back to your side while in a flat bed without using bedrails?: A Lot Help needed moving from lying on your back to sitting on the side of a flat bed without using bedrails?: A Lot Help needed moving to and from a bed to a chair (including a wheelchair)?: A Lot Help needed standing up from a chair using your arms (e.g., wheelchair or bedside chair)?: A Lot Help needed to walk in hospital room?: A Lot Help needed climbing 3-5 steps with a railing? : Total 6 Click Score: 11    End of Session   Activity Tolerance: Patient limited by fatigue Patient left: in chair;with call bell/phone within reach;with chair alarm set Nurse Communication: Mobility status PT Visit Diagnosis: Other abnormalities of gait and mobility (R26.89);Muscle weakness (generalized) (M62.81)    Time: [id number] PT Time Calculation (min) (ACUTE ONLY): 37 min   Charges:   PT Evaluation $PT Eval Moderate Complexity: 1 Mod          Halah Whiteside T  Lewayne Pauley, PT   Delford Field 11/07/2018, 2:50 PM

## 2018-11-07 NOTE — Progress Notes (Signed)
Occupational Therapy Evaluation Patient Details Name: Mike Briggs MRN: [id number] DOB: [birth year] Today's Date: 11/07/2018    History of Present Illness 85 y.o. male past medical history of chronic kidney disease stage III, essential hypertension wheelchair-bound lives at an dependent living facility admitted on 11/02/2018 fever generalized weakness fatigue nonproductive cough COVID-19 PCR was positive, admitted to [facility] on 11/03/2018.  He also had dysuria on admission   Clinical Impression   PTA, pt lived at home alone and had a PCA 8 hrs/day who assisted with ADL and IADL tasks. Pt's niece states that pt was able to do his basic self care, transfer to his power chair and take himself to the bathroom independently before he "got sick". Pt currently requires Ma X +2 for squat pivot transfer to chair and Max A for LB ADL. Apparent fear of falling. Recommend SNF for rehab.  Spoke with niece over the phone for PLOF. She states that she would prefer for her uncle to DC home, however given level of assistance required due to his generalized weakness, do not feel they would able to manage him safely at home.    Follow Up Recommendations  SNF;Supervision/Assistance - 24 hour    Equipment Recommendations  None recommended by OT    Recommendations for Other Services       Precautions / Restrictions Precautions Precautions: Fall      Mobility Bed Mobility Overal bed mobility: Needs Assistance Bed Mobility: Supine to Sit     Supine to sit: Mod assist;+2 for physical assistance     General bed mobility comments: Diffiuclty moving legs off bed. Appears to use trapeze bar at home.   Transfers Overall transfer level: Needs assistance   Transfers: Squat Pivot Transfers     Squat pivot transfers: Max assist;+2 physical assistance     General transfer comment: Pt expressed fear of falling    Balance Overall balance assessment: Needs assistance Sitting-balance support: Feet  supported Sitting balance-Leahy Scale: Fair                                     ADL either performed or assessed with clinical judgement   ADL Overall ADL's : Needs assistance/impaired Eating/Feeding: Set up;Sitting   Grooming: Set up;Sitting   Upper Body Bathing: Minimal assistance;Sitting   Lower Body Bathing: Maximal assistance;Sit to/from stand   Upper Body Dressing : Minimal assistance;Sitting   Lower Body Dressing: Maximal assistance;Sit to/from stand   Toilet Transfer: Maximal assistance;+2 for physical assistance(simulated to recliner)   Toileting- Clothing Manipulation and Hygiene: Maximal assistance       Functional mobility during ADLs: Maximal assistance;+2 for physical assistance;Cueing for safety;Cueing for sequencing       Vision         Perception     Praxis      Pertinent Vitals/Pain Pain Assessment: Faces Faces Pain Scale: Hurts little more Pain Location: back; BLE Pain Descriptors / Indicators: Aching;Discomfort Pain Intervention(s): Limited activity within patient's tolerance     Hand Dominance Right   Extremity/Trunk Assessment Upper Extremity Assessment Upper Extremity Assessment: Generalized weakness(L weaker than R)   Lower Extremity Assessment Lower Extremity Assessment: Defer to PT evaluation("I can't use my legs")   Cervical / Trunk Assessment Cervical / Trunk Assessment: Kyphotic(forward head)   Communication Communication Communication: HOH   Cognition Arousal/Alertness: Awake/alert Behavior During Therapy: WFL for tasks assessed/performed Overall Cognitive Status: No family/caregiver present to determine  baseline cognitive functioning                                 General Comments: cognitive impariments at baseline; pt unaware why he was in the hospital but knew he was in a hospital; increased time for processing and problem solving   General Comments       Exercises     Shoulder  Instructions      Home Living Family/patient expects to be discharged to:: Private residence Living Arrangements: Alone;Other (Comment)(caregiver 8 hrs/dau) Available Help at Discharge: Personal care attendant;Family;Available 24 hours/day(per niece - trying to arrange 24/7) Type of Home: Apartment Home Access: Level entry     Home Layout: One level     Bathroom Shower/Tub: Teacher, early years/pre: Handicapped height Bathroom Accessibility: Yes How Accessible: Accessible via wheelchair Home Equipment: [place] - 2 wheels;Bedside commode;Shower seat;Grab bars - toilet;Grab bars - tub/shower;Electric scooter          Prior Functioning/Environment Level of Independence: Needs assistance  Gait / Transfers Assistance Needed: primarily uses scooter; ableto transfer self into scooter independently ADL's / Homemaking Assistance Needed: Has HHA 8 hrs/day who assits with bathing and IADL tasks; pt able to take himself to the toilet Communication / Swallowing Assistance Needed: HOH          OT Problem List: Decreased strength;Decreased activity tolerance;Impaired balance (sitting and/or standing);Decreased cognition;Decreased safety awareness;Decreased knowledge of use of DME or AE;Cardiopulmonary status limiting activity;Pain      OT Treatment/Interventions: Self-care/ADL training;Therapeutic exercise;Neuromuscular education;Energy conservation;DME and/or AE instruction;Therapeutic activities;Cognitive remediation/compensation;Patient/family education;Balance training    OT Goals(Current goals can be found in the care plan section) Acute Rehab OT Goals Patient Stated Goal: to get better OT Goal Formulation: With patient/family Time For Goal Achievement: 11/21/18 Potential to Achieve Goals: Good  OT Frequency: Min 2X/week   Barriers to D/C:            Co-evaluation PT/OT/SLP Co-Evaluation/Treatment: Yes Reason for Co-Treatment: For patient/therapist safety;To address  functional/ADL transfers   OT goals addressed during session: ADL's and self-care      AM-PAC OT "6 Clicks" Daily Activity     Outcome Measure Help from another person eating meals?: A Little Help from another person taking care of personal grooming?: A Little Help from another person toileting, which includes using toliet, bedpan, or urinal?: A Lot Help from another person bathing (including washing, rinsing, drying)?: A Lot Help from another person to put on and taking off regular upper body clothing?: A Lot Help from another person to put on and taking off regular lower body clothing?: A Lot 6 Click Score: 14   End of Session Equipment Utilized During Treatment: Gait belt Nurse Communication: Mobility status;Need for lift equipment(Bari Stedy)  Activity Tolerance: Patient tolerated treatment well Patient left: in chair;with call bell/phone within reach;with chair alarm set  OT Visit Diagnosis: Other abnormalities of gait and mobility (R26.89);Muscle weakness (generalized) (M62.81);Other symptoms and signs involving cognitive function;Pain Pain - Right/Left: (B) Pain - part of body: Leg(back)                Time: [id number] OT Time Calculation (min): 37 min Charges:  OT General Charges $OT Visit: 1 Visit OT Evaluation $OT Eval Moderate Complexity: [place], OT/L   Acute OT Clinical Specialist [place] Pager [phone] Office [phone]   [facility] 11/07/2018, 1:56 PM

## 2018-11-07 NOTE — Progress Notes (Signed)
[facility] PROGRESS NOTE    Progress Note  Mike Briggs  [id number] DOB: 1928-06-15 DOA: 11/02/2018 PCP: Rosita Fire, MD     Brief Narrative:   Mike Briggs is an 85 y.o. male past medical history of chronic kidney disease stage III, essential hypertension wheelchair-bound lives at an dependent living facility admitted on 11/02/2018 fever generalized weakness fatigue nonproductive cough COVID-19 PCR was positive, admitted to [facility] on 11/03/2018.  He also had dysuria on admission  Assessment/Plan:   COVID-19 viral infection: Currently asymptomatic not hypoxic satting greater 92% on room air.  Dysuria likely due to UTI: Urine culture was done that showed multiple organism. Blood culture grew staph coagulase-negative 1 out of 2 likely a contaminant. He has completed his course of IV Rocephin.  Chronic kidney disease stage III: Currently at baseline.  Diabetes mellitus type 2: With a last A1c of 9.  Continue sliding scale.  Essential hypertension: Currently stable continue current regimen.  Debilitated/conditioning: Wheelchair-bound.   DVT prophylaxis: lovenxo Family Communication:niece Disposition Plan/Barrier to D/C: COVID positivity cannot go back home as he had a health aide which was helping him with his ADLs.  Therapy was consulted and social worker for possible skilled nursing facility placement. Code Status:     Code Status Orders  (From admission, onward)         Start     Ordered   11/02/18 1648  Full code  Continuous     11/02/18 1647        Code Status History    Date Active Date Inactive Code Status Order ID Comments User Context   02/25/2018 2137 02/28/2018 2018 Full Code [id number]  Bethena Roys, MD Inpatient   10/20/2012 1850 10/23/2012 1821 Full Code [id number]  Samuella Cota, MD Inpatient   Advance Care Planning Activity    Advance Directive Documentation     Most Recent Value  Type of Advance Directive   Healthcare Power of Attorney [mary francis- niece]  Pre-existing out of facility DNR order (yellow form or pink MOST form)  -  "MOST" Form in Place?  -        IV Access:    Peripheral IV   Procedures and diagnostic studies:   No results found.   Medical Consultants:    None.  Anti-Infectives:  None  Subjective:    [name] no complaints feels great.  Objective:    Vitals:   11/06/18 0900 11/06/18 1630 11/06/18 1954 11/07/18 0409  BP: (!) 174/88 (!) 154/73 (!) 147/76 125/87  Pulse: 92 83 85 98  Resp:      Temp: 99.4 F (37.4 C) 99.1 F (37.3 C) 99.4 F (37.4 C) 100 F (37.8 C)  TempSrc: Oral Oral Oral Oral  SpO2: 98% 96% 96% 94%  Weight:      Height:       SpO2: 94 %   Intake/Output Summary (Last 24 hours) at 11/07/2018 0739 Last data filed at 11/07/2018 [id number] Gross per 24 hour  Intake -  Output 575 ml  Net -575 ml   Filed Weights   11/02/18 1146  Weight: 90.7 kg    Exam: General exam: In no acute distress. Respiratory system: Good air movement and clear to auscultation. Cardiovascular system: S1 & S2 heard, RRR. No JVD. Gastrointestinal system: Abdomen is nondistended, soft and nontender.  Central nervous system: Alert and oriented. No focal neurological deficits. Extremities: No pedal edema. Skin: No rashes, lesions or ulcers  Data Reviewed:  Labs: Basic Metabolic Panel: Recent Labs  Lab 11/02/18 1221 11/03/18 0517  NA 135 136  K 3.7 3.5  CL 100 106  CO2 25 21*  GLUCOSE 222* 182*  BUN 28* 25*  CREATININE 1.53* 1.22  CALCIUM 8.3* 7.3*   GFR Estimated Creatinine Clearance: 48.2 mL/min (by C-G formula based on SCr of 1.22 mg/dL). Liver Function Tests: Recent Labs  Lab 11/02/18 1221  AST 34  ALT 19  ALKPHOS 138*  BILITOT 0.9  PROT 7.3  ALBUMIN 3.3*   No results for input(s): LIPASE, AMYLASE in the last 168 hours. No results for input(s): AMMONIA in the last 168 hours. Coagulation profile Recent Labs  Lab  11/02/18 1221  INR 1.0   COVID-19 Labs  No results for input(s): DDIMER, FERRITIN, LDH, CRP in the last 72 hours.  Lab Results  Component Value Date   SARSCOV2NAA POSITIVE (A) 11/02/2018    CBC: Recent Labs  Lab 11/02/18 1221 11/03/18 0517  WBC 6.5 4.6  NEUTROABS 4.4  --   HGB 13.3 12.0*  HCT 43.0 37.3*  MCV 97.9 96.1  PLT 169 145*   Cardiac Enzymes: Recent Labs  Lab 11/02/18 1221 11/03/18 0517  CKTOTAL 518* 536*   BNP (last 3 results) No results for input(s): PROBNP in the last 8760 hours. CBG: Recent Labs  Lab 11/05/18 2118 11/06/18 0940 11/06/18 1314 11/06/18 1631 11/06/18 2159  GLUCAP 217* 129* 242* 164* 174*   D-Dimer: No results for input(s): DDIMER in the last 72 hours. Hgb A1c: Recent Labs    11/04/18 1441  HGBA1C 9.0*   Lipid Profile: No results for input(s): CHOL, HDL, LDLCALC, TRIG, CHOLHDL, LDLDIRECT in the last 72 hours. Thyroid function studies: No results for input(s): TSH, T4TOTAL, T3FREE, THYROIDAB in the last 72 hours.  Invalid input(s): FREET3 Anemia work up: No results for input(s): VITAMINB12, FOLATE, FERRITIN, TIBC, IRON, RETICCTPCT in the last 72 hours. Sepsis Labs: Recent Labs  Lab 11/02/18 1221 11/02/18 1431 11/03/18 0517 11/03/18 1330  PROCALCITON  --   --   --  <0.10  WBC 6.5  --  4.6  --   LATICACIDVEN 2.4* 1.3  --   --    Microbiology Recent Results (from the past 240 hour(s))  Urine Culture     Status: None   Collection Time: 11/02/18 12:21 PM   Specimen: Urine, Random  Result Value Ref Range Status   Specimen Description   Final    URINE, RANDOM Performed at [facility], [address] [place], [place] [address]    Special Requests   Final    NONE Performed at [facility], [address], [place], [place] [address]    Culture   Final    Multiple bacterial morphotypes present, none predominant. Suggest appropriate recollection if clinically indicated.   Report Status 11/03/2018 FINAL  Final  SARS  CORONAVIRUS 2 (TAT 6-24 HRS) Nasopharyngeal Urine, Clean Catch     Status: Abnormal   Collection Time: 11/02/18 12:21 PM   Specimen: Urine, Clean Catch; Nasopharyngeal  Result Value Ref Range Status   SARS Coronavirus 2 POSITIVE (A) NEGATIVE Final    Comment: (NOTE) SARS-CoV-2 target nucleic acids are DETECTED. The SARS-CoV-2 RNA is generally detectable in upper and lower respiratory specimens during the acute phase of infection. Positive results are indicative of active infection with SARS-CoV-2. Clinical  correlation with patient history and other diagnostic information is necessary to determine patient infection status. Positive results do  not rule out bacterial infection or co-infection  with other viruses. The expected result is Negative. Fact Sheet for Patients: SugarRoll.be Fact Sheet for Healthcare Providers: https://www.woods-mathews.com/ This test is not yet approved or cleared by the [place] FDA and  has been authorized for detection and/or diagnosis of SARS-CoV-2 by FDA under an Emergency Use Authorization (EUA). This EUA will remain  in effect (meaning this test can be used) for the duration of the COVID-19 declaration under Section 564(b)(1) of the Act, 21 U.S.C.  section 360bbb-3(b)(1), unless the authorization is terminated or revoked sooner. Performed at [facility], [place] [address], [place], [place] [address]   Blood Culture (routine x 2)     Status: Abnormal   Collection Time: 11/02/18  2:31 PM   Specimen: Right Antecubital; Blood  Result Value Ref Range Status   Specimen Description   Final    RIGHT ANTECUBITAL Performed at [facility], [address] [place], [place] [address]    Special Requests   Final    BOTTLES DRAWN AEROBIC AND ANAEROBIC Blood Culture adequate volume Performed at [facility], [address], [place], [place] [address]    Culture  Setup Time   Final    GRAM POSITIVE COCCI Gram  Stain Report Called to,Read Back By and Verified With: HOWERTON M. AT [id number] ON [id number] BY THOMPSON S. AEROBIC BOTTLE ONLY CRITICAL RESULT CALLED TO, READ BACK BY AND VERIFIED WITH: A AMBURN RN 11/03/18 2010 JDW    Culture (A)  Final    STAPHYLOCOCCUS SPECIES (COAGULASE NEGATIVE) THE SIGNIFICANCE OF ISOLATING THIS ORGANISM FROM A SINGLE SET OF BLOOD CULTURES WHEN MULTIPLE SETS ARE DRAWN IS UNCERTAIN. PLEASE NOTIFY THE MICROBIOLOGY DEPARTMENT WITHIN ONE WEEK IF SPECIATION AND SENSITIVITIES ARE REQUIRED. Performed at [facility], [place] [address], [place],  [address]    Report Status 11/04/2018 FINAL  Final  Blood Culture ID Panel (Reflexed)     Status: Abnormal   Collection Time: 11/02/18  2:31 PM  Result Value Ref Range Status   Enterococcus species NOT DETECTED NOT DETECTED Final   Listeria monocytogenes NOT DETECTED NOT DETECTED Final   Staphylococcus species DETECTED (A) NOT DETECTED Final    Comment: Methicillin (oxacillin) susceptible coagulase negative staphylococcus. Possible blood culture contaminant (unless isolated from more than one blood culture draw or clinical case suggests pathogenicity). No antibiotic treatment is indicated for blood  culture contaminants. CRITICAL RESULT CALLED TO, READ BACK BY AND VERIFIED WITH: A AMBURN RN 11/03/18 2010 JDW    Staphylococcus aureus (BCID) NOT DETECTED NOT DETECTED Final   Methicillin resistance NOT DETECTED NOT DETECTED Final   Streptococcus species NOT DETECTED NOT DETECTED Final   Streptococcus agalactiae NOT DETECTED NOT DETECTED Final   Streptococcus pneumoniae NOT DETECTED NOT DETECTED Final   Streptococcus pyogenes NOT DETECTED NOT DETECTED Final   Acinetobacter baumannii NOT DETECTED NOT DETECTED Final   Enterobacteriaceae species NOT DETECTED NOT DETECTED Final   Enterobacter cloacae complex NOT DETECTED NOT DETECTED Final   Escherichia coli NOT DETECTED NOT DETECTED Final   Klebsiella oxytoca NOT DETECTED NOT DETECTED  Final   Klebsiella pneumoniae NOT DETECTED NOT DETECTED Final   Proteus species NOT DETECTED NOT DETECTED Final   Serratia marcescens NOT DETECTED NOT DETECTED Final   Haemophilus influenzae NOT DETECTED NOT DETECTED Final   Neisseria meningitidis NOT DETECTED NOT DETECTED Final   Pseudomonas aeruginosa NOT DETECTED NOT DETECTED Final   Candida albicans NOT DETECTED NOT DETECTED Final   Candida glabrata NOT DETECTED NOT DETECTED Final   Candida  krusei NOT DETECTED NOT DETECTED Final   Candida parapsilosis NOT DETECTED NOT DETECTED Final   Candida tropicalis NOT DETECTED NOT DETECTED Final    Comment: Performed at [facility], [place] [address] [place], [place] [address]  Blood Culture (routine x 2)     Status: None (Preliminary result)   Collection Time: 11/02/18  2:40 PM   Specimen: BLOOD LEFT HAND  Result Value Ref Range Status   Specimen Description BLOOD LEFT HAND  Final   Special Requests   Final    BOTTLES DRAWN AEROBIC ONLY Blood Culture results may not be optimal due to an inadequate volume of blood received in culture bottles   Culture   Final    NO GROWTH 4 DAYS Performed at [facility], [address] [place], [place] [address]    Report Status PENDING  Incomplete  Urine Culture     Status: Abnormal   Collection Time: 11/03/18  1:56 PM   Specimen: Urine, Catheterized  Result Value Ref Range Status   Specimen Description   Final    URINE, CATHETERIZED Performed at [facility], [address] [place], [place] [address]    Special Requests   Final    Normal Performed at [facility], [address], [place], [place] [address]    Culture MULTIPLE SPECIES PRESENT, SUGGEST RECOLLECTION (A)  Final   Report Status 11/04/2018 FINAL  Final     Medications:   . amLODipine  10 mg Oral Daily  . colchicine  0.6 mg Oral q morning - 10a  . enoxaparin (LOVENOX) injection  40 mg Subcutaneous Q24H  . guaiFENesin  600 mg Oral BID  . insulin aspart  0-5 Units Subcutaneous  QHS  . insulin aspart  0-9 Units Subcutaneous TID WC  . lisinopril  40 mg Oral q morning - 10a  . memantine  10 mg Oral BID  . metoprolol succinate  25 mg Oral q morning - 10a  . polyethylene glycol  17 g Oral Daily  . senna-docusate  2 tablet Oral QHS  . tamsulosin  0.4 mg Oral QPC breakfast  . vitamin C  500 mg Oral BID  . zinc sulfate  220 mg Oral Daily   Continuous Infusions:    LOS: 4 days   Charlynne Cousins  [facility]  11/07/2018, 7:39 AM

## 2018-11-07 NOTE — Progress Notes (Signed)
Attempted to call patients niece for daily update and was not able to get through. Will attempt later.

## 2018-11-08 DIAGNOSIS — L8990 Pressure ulcer of unspecified site, unspecified stage: Secondary | ICD-10-CM | POA: Insufficient documentation

## 2018-11-08 LAB — GLUCOSE, CAPILLARY
Glucose-Capillary: 129 mg/dL — ABNORMAL HIGH (ref 70–99)
Glucose-Capillary: 267 mg/dL — ABNORMAL HIGH (ref 70–99)
Glucose-Capillary: 230 mg/dL — ABNORMAL HIGH (ref 70–99)
Glucose-Capillary: 187 mg/dL — ABNORMAL HIGH (ref 70–99)

## 2018-11-08 MED ORDER — INSULIN DETEMIR 100 UNIT/ML ~~LOC~~ SOLN
5.0000 [IU] | Freq: Two times a day (BID) | SUBCUTANEOUS | Status: DC
  Administered 2018-11-08 – 2018-11-11 (×6): 5 [IU] via SUBCUTANEOUS
  Filled 2018-11-08 (×6): qty 0.05

## 2018-11-08 NOTE — Progress Notes (Signed)
CSW has attempted multiple times throughout the day to reach the patient's niece, Stanton Kidney, to discuss SNF recommendation. Unable to get through on the home number, there is a busy signal every time, and there is no voicemail set up on the cell phone number listed. CSW sent a text message to the cell phone number.  Laveda Abbe, [place] Clinical Social Worker [phone]

## 2018-11-08 NOTE — Progress Notes (Signed)
[facility] PROGRESS NOTE    Progress Note  Mike Briggs  [id number] DOB: [birth year] DOA: 11/02/2018 PCP: Rosita Fire, MD     Brief Narrative:   Mike Briggs is an 85 y.o. male past medical history of chronic kidney disease stage III, essential hypertension wheelchair-bound lives at an dependent living facility admitted on 11/02/2018 fever generalized weakness fatigue nonproductive cough COVID-19 PCR was positive, admitted to [facility] on 11/03/2018.  He also had dysuria on admission  Assessment/Plan:   COVID-19 viral infection: Currently asymptomatic not hypoxic satting greater 92% on room air.  Dysuria likely due to UTI: Urine culture was done that showed multiple organism. Blood culture grew staph coagulase-negative 1 out of 2 likely a contaminant. He has completed his course of IV Rocephin.  Chronic kidney disease stage III: Currently at baseline.  Diabetes mellitus type 2: With a last A1c of 9.  Continue sliding scale.  Essential hypertension: Currently stable continue current regimen.  Debilitated/conditioning: Wheelchair-bound.   DVT prophylaxis: lovenxo Family Communication:niece Disposition Plan/Barrier to D/C: COVID positivity cannot go back home as he had a health aide which was helping him with his ADLs.  Therapy was consulted and social worker for possible skilled nursing facility placement. Code Status:     Code Status Orders  (From admission, onward)         Start     Ordered   11/02/18 1648  Full code  Continuous     11/02/18 1647        Code Status History    Date Active Date Inactive Code Status Order ID Comments User Context   02/25/2018 2137 02/28/2018 2018 Full Code [id number]  Bethena Roys, MD Inpatient   10/20/2012 1850 10/23/2012 1821 Full Code [id number]  Samuella Cota, MD Inpatient   Advance Care Planning Activity    Advance Directive Documentation     Most Recent Value  Type of Advance Directive   Healthcare Power of Attorney [mary francis- niece]  Pre-existing out of facility DNR order (yellow form or pink MOST form)  -  "MOST" Form in Place?  -        IV Access:    Peripheral IV   Procedures and diagnostic studies:   No results found.   Medical Consultants:    None.  Anti-Infectives:  None  Subjective:    Mike Briggs no complaints feels great.  Objective:    Vitals:   11/07/18 1600 11/07/18 1940 11/08/18 0410 11/08/18 0704  BP: 139/71 (!) 157/78 (!) 150/73 (!) 146/75  Pulse: 97 91 93 95  Resp: 16 16 18 16   Temp: 99.3 F (37.4 C) 98.9 F (37.2 C) 98.4 F (36.9 C) 99.3 F (37.4 C)  TempSrc: Oral Oral Oral Oral  SpO2: 96% 94% 92% 93%  Weight:      Height:       SpO2: 93 %   Intake/Output Summary (Last 24 hours) at 11/08/2018 0730 Last data filed at 11/08/2018 0410 Gross per 24 hour  Intake 1 ml  Output 552 ml  Net -551 ml   Filed Weights   11/02/18 1146  Weight: 90.7 kg    Exam: General exam: In no acute distress. Respiratory system: Good air movement and clear to auscultation. Cardiovascular system: S1 & S2 heard, RRR. No JVD. Gastrointestinal system: Abdomen is nondistended, soft and nontender.  Central nervous system: Alert and oriented. No focal neurological deficits. Extremities: No pedal edema. Skin: No rashes, lesions or ulcers  Data Reviewed:  Labs: Basic Metabolic Panel: Recent Labs  Lab 11/02/18 1221 11/03/18 0517  NA 135 136  K 3.7 3.5  CL 100 106  CO2 25 21*  GLUCOSE 222* 182*  BUN 28* 25*  CREATININE 1.53* 1.22  CALCIUM 8.3* 7.3*   GFR Estimated Creatinine Clearance: 48.2 mL/min (by C-G formula based on SCr of 1.22 mg/dL). Liver Function Tests: Recent Labs  Lab 11/02/18 1221  AST 34  ALT 19  ALKPHOS 138*  BILITOT 0.9  PROT 7.3  ALBUMIN 3.3*   No results for input(s): LIPASE, AMYLASE in the last 168 hours. No results for input(s): AMMONIA in the last 168 hours. Coagulation profile  Recent Labs  Lab 11/02/18 1221  INR 1.0   COVID-19 Labs  No results for input(s): DDIMER, FERRITIN, LDH, CRP in the last 72 hours.  Lab Results  Component Value Date   SARSCOV2NAA POSITIVE (A) 11/02/2018    CBC: Recent Labs  Lab 11/02/18 1221 11/03/18 0517  WBC 6.5 4.6  NEUTROABS 4.4  --   HGB 13.3 12.0*  HCT 43.0 37.3*  MCV 97.9 96.1  PLT 169 145*   Cardiac Enzymes: Recent Labs  Lab 11/02/18 1221 11/03/18 0517  CKTOTAL 518* 536*   BNP (last 3 results) No results for input(s): PROBNP in the last 8760 hours. CBG: Recent Labs  Lab 11/06/18 2159 11/07/18 0814 11/07/18 1709 11/07/18 2140 11/08/18 0716  GLUCAP 174* 159* 278* 203* 129*   D-Dimer: No results for input(s): DDIMER in the last 72 hours. Hgb A1c: No results for input(s): HGBA1C in the last 72 hours. Lipid Profile: No results for input(s): CHOL, HDL, LDLCALC, TRIG, CHOLHDL, LDLDIRECT in the last 72 hours. Thyroid function studies: No results for input(s): TSH, T4TOTAL, T3FREE, THYROIDAB in the last 72 hours.  Invalid input(s): FREET3 Anemia work up: No results for input(s): VITAMINB12, FOLATE, FERRITIN, TIBC, IRON, RETICCTPCT in the last 72 hours. Sepsis Labs: Recent Labs  Lab 11/02/18 1221 11/02/18 1431 11/03/18 0517 11/03/18 1330  PROCALCITON  --   --   --  <0.10  WBC 6.5  --  4.6  --   LATICACIDVEN 2.4* 1.3  --   --    Microbiology Recent Results (from the past 240 hour(s))  Urine Culture     Status: None   Collection Time: 11/02/18 12:21 PM   Specimen: Urine, Random  Result Value Ref Range Status   Specimen Description   Final    URINE, RANDOM Performed at [facility] [address] [place], [place] [address]    Special Requests   Final    NONE Performed at [facility], [address] [place], [place] [address]    Culture   Final    Multiple bacterial morphotypes present, none predominant. Suggest appropriate recollection if clinically indicated.   Report Status  11/03/2018 FINAL  Final  SARS CORONAVIRUS 2 (TAT 6-24 HRS) Nasopharyngeal Urine, Clean Catch     Status: Abnormal   Collection Time: 11/02/18 12:21 PM   Specimen: Urine, Clean Catch; Nasopharyngeal  Result Value Ref Range Status   SARS Coronavirus 2 POSITIVE (A) NEGATIVE Final    Comment: (NOTE) SARS-CoV-2 target nucleic acids are DETECTED. The SARS-CoV-2 RNA is generally detectable in upper and lower respiratory specimens during the acute phase of infection. Positive results are indicative of active infection with SARS-CoV-2. Clinical  correlation with patient history and other diagnostic information is necessary to determine patient infection status. Positive results do  not rule out bacterial infection or co-infection with other  viruses. The expected result is Negative. Fact Sheet for Patients: SugarRoll.be Fact Sheet for Healthcare Providers: https://www.woods-mathews.com/ This test is not yet approved or cleared by the [place] FDA and  has been authorized for detection and/or diagnosis of SARS-CoV-2 by FDA under an Emergency Use Authorization (EUA). This EUA will remain  in effect (meaning this test can be used) for the duration of the COVID-19 declaration under Section 564(b)(1) of the Act, 21 U.S.C.  section 360bbb-3(b)(1), unless the authorization is terminated or revoked sooner. Performed at [facility], [place] [address], [place], [place] [address]   Blood Culture (routine x 2)     Status: Abnormal   Collection Time: 11/02/18  2:31 PM   Specimen: Right Antecubital; Blood  Result Value Ref Range Status   Specimen Description   Final    RIGHT ANTECUBITAL Performed at [facility], [address], [place], [place] [address]    Special Requests   Final    BOTTLES DRAWN AEROBIC AND ANAEROBIC Blood Culture adequate volume Performed at [facility], [address] [place], [place] [address]    Culture  Setup Time   Final     GRAM POSITIVE COCCI Gram Stain Report Called to,Read Back By and Verified With: HOWERTON M. AT [id number] ON [id number] BY THOMPSON S. AEROBIC BOTTLE ONLY CRITICAL RESULT CALLED TO, READ BACK BY AND VERIFIED WITH: A AMBURN RN 11/03/18 2010 JDW    Culture (A)  Final    STAPHYLOCOCCUS SPECIES (COAGULASE NEGATIVE) THE SIGNIFICANCE OF ISOLATING THIS ORGANISM FROM A SINGLE SET OF BLOOD CULTURES WHEN MULTIPLE SETS ARE DRAWN IS UNCERTAIN. PLEASE NOTIFY THE MICROBIOLOGY DEPARTMENT WITHIN ONE WEEK IF SPECIATION AND SENSITIVITIES ARE REQUIRED. Performed at [facility], [place] [address] [place], [place] [address]    Report Status 11/04/2018 FINAL  Final  Blood Culture ID Panel (Reflexed)     Status: Abnormal   Collection Time: 11/02/18  2:31 PM  Result Value Ref Range Status   Enterococcus species NOT DETECTED NOT DETECTED Final   Listeria monocytogenes NOT DETECTED NOT DETECTED Final   Staphylococcus species DETECTED (A) NOT DETECTED Final    Comment: Methicillin (oxacillin) susceptible coagulase negative staphylococcus. Possible blood culture contaminant (unless isolated from more than one blood culture draw or clinical case suggests pathogenicity). No antibiotic treatment is indicated for blood  culture contaminants. CRITICAL RESULT CALLED TO, READ BACK BY AND VERIFIED WITH: A AMBURN RN 11/03/18 2010 JDW    Staphylococcus aureus (BCID) NOT DETECTED NOT DETECTED Final   Methicillin resistance NOT DETECTED NOT DETECTED Final   Streptococcus species NOT DETECTED NOT DETECTED Final   Streptococcus agalactiae NOT DETECTED NOT DETECTED Final   Streptococcus pneumoniae NOT DETECTED NOT DETECTED Final   Streptococcus pyogenes NOT DETECTED NOT DETECTED Final   Acinetobacter baumannii NOT DETECTED NOT DETECTED Final   Enterobacteriaceae species NOT DETECTED NOT DETECTED Final   Enterobacter cloacae complex NOT DETECTED NOT DETECTED Final   Escherichia coli NOT DETECTED NOT DETECTED Final   Klebsiella  oxytoca NOT DETECTED NOT DETECTED Final   Klebsiella pneumoniae NOT DETECTED NOT DETECTED Final   Proteus species NOT DETECTED NOT DETECTED Final   Serratia marcescens NOT DETECTED NOT DETECTED Final   Haemophilus influenzae NOT DETECTED NOT DETECTED Final   Neisseria meningitidis NOT DETECTED NOT DETECTED Final   Pseudomonas aeruginosa NOT DETECTED NOT DETECTED Final   Candida albicans NOT DETECTED NOT DETECTED Final   Candida glabrata NOT DETECTED NOT DETECTED Final   Candida krusei NOT  DETECTED NOT DETECTED Final   Candida parapsilosis NOT DETECTED NOT DETECTED Final   Candida tropicalis NOT DETECTED NOT DETECTED Final    Comment: Performed at [facility], [place] [address], [place], [place] [address]  Blood Culture (routine x 2)     Status: None   Collection Time: 11/02/18  2:40 PM   Specimen: BLOOD LEFT HAND  Result Value Ref Range Status   Specimen Description BLOOD LEFT HAND  Final   Special Requests   Final    BOTTLES DRAWN AEROBIC ONLY Blood Culture results may not be optimal due to an inadequate volume of blood received in culture bottles   Culture   Final    NO GROWTH 5 DAYS Performed at [facility], [address], [place], [place] [address]    Report Status 11/07/2018 FINAL  Final  Urine Culture     Status: Abnormal   Collection Time: 11/03/18  1:56 PM   Specimen: Urine, Catheterized  Result Value Ref Range Status   Specimen Description   Final    URINE, CATHETERIZED Performed at [facility], [address] [place], [place] [address]    Special Requests   Final    Normal Performed at [facility], [address], [place], [place] [address]    Culture MULTIPLE SPECIES PRESENT, SUGGEST RECOLLECTION (A)  Final   Report Status 11/04/2018 FINAL  Final     Medications:   . amLODipine  10 mg Oral Daily  . colchicine  0.6 mg Oral q morning - 10a  . enoxaparin (LOVENOX) injection  40 mg Subcutaneous Q24H  . guaiFENesin  600 mg Oral BID  . insulin aspart  0-5  Units Subcutaneous QHS  . insulin aspart  0-9 Units Subcutaneous TID WC  . lisinopril  40 mg Oral q morning - 10a  . memantine  10 mg Oral BID  . metoprolol succinate  25 mg Oral q morning - 10a  . polyethylene glycol  17 g Oral Daily  . senna-docusate  2 tablet Oral QHS  . tamsulosin  0.4 mg Oral QPC breakfast  . vitamin C  500 mg Oral BID  . zinc sulfate  220 mg Oral Daily   Continuous Infusions:    LOS: 5 days   Charlynne Cousins  [facility]  11/08/2018, 7:30 AM

## 2018-11-09 LAB — GLUCOSE, CAPILLARY
Glucose-Capillary: 144 mg/dL — ABNORMAL HIGH (ref 70–99)
Glucose-Capillary: 173 mg/dL — ABNORMAL HIGH (ref 70–99)
Glucose-Capillary: 313 mg/dL — ABNORMAL HIGH (ref 70–99)
Glucose-Capillary: 119 mg/dL — ABNORMAL HIGH (ref 70–99)

## 2018-11-09 LAB — CREATININE, SERUM
Creatinine, Ser: 1.25 mg/dL — ABNORMAL HIGH (ref 0.61–1.24)
GFR calc Af Amer: 59 mL/min — ABNORMAL LOW (ref >60)
GFR calc non Af Amer: 51 mL/min — ABNORMAL LOW (ref >60)

## 2018-11-09 NOTE — Progress Notes (Signed)
[facility] PROGRESS NOTE    Progress Note  Mike Briggs  [id number] DOB: Aug 31, 1928 DOA: 11/02/2018 PCP: Rosita Fire, MD     Brief Narrative:   Mike Briggs is an 85 y.o. male past medical history of chronic kidney disease stage III, essential hypertension wheelchair-bound lives at an dependent living facility admitted on 11/02/2018 fever generalized weakness fatigue nonproductive cough COVID-19 PCR was positive, admitted to [facility] on 11/03/2018.  He also had dysuria on admission  Assessment/Plan:   COVID-19 viral infection: Currently asymptomatic not hypoxic satting greater 92% on room air.  Dysuria likely due to UTI: Urine culture was done that showed multiple organism. Blood culture grew staph coagulase-negative 1 out of 2 likely a contaminant. He has completed his course of IV Rocephin.  Chronic kidney disease stage III: Currently at baseline.  Diabetes mellitus type 2: With a last A1c of 9.  Continue sliding scale.  Essential hypertension: Currently stable continue current regimen.  Debilitated/conditioning: Wheelchair-bound.   DVT prophylaxis: lovenxo Family Communication:niece Disposition Plan/Barrier to D/C: COVID positivity cannot go back home as he had a health aide which was helping him with his ADLs.  Therapy was consulted and social worker for possible skilled nursing facility placement. Code Status:     Code Status Orders  (From admission, onward)         Start     Ordered   11/02/18 1648  Full code  Continuous     11/02/18 1647        Code Status History    Date Active Date Inactive Code Status Order ID Comments User Context   02/25/2018 2137 02/28/2018 2018 Full Code [id number]  Bethena Roys, MD Inpatient   10/20/2012 1850 10/23/2012 1821 Full Code [id number]  Samuella Cota, MD Inpatient   Advance Care Planning Activity    Advance Directive Documentation     Most Recent Value  Type of Advance Directive   Healthcare Power of Attorney [mary francis- niece]  Pre-existing out of facility DNR order (yellow form or pink MOST form)  -  "MOST" Form in Place?  -        IV Access:    Peripheral IV   Procedures and diagnostic studies:   No results found.   Medical Consultants:    None.  Anti-Infectives:  None  Subjective:    Mike Briggs no complaints feels great.  Objective:    Vitals:   11/08/18 0704 11/08/18 1455 11/08/18 2003 11/09/18 0515  BP: (!) 146/75 129/69 127/65 (!) 124/51  Pulse: 95 90 96 92  Resp: 16 16 18 18   Temp: 99.3 F (37.4 C) 99.7 F (37.6 C) 98.6 F (37 C) 99.3 F (37.4 C)  TempSrc: Oral Axillary Oral Oral  SpO2: 93% 95% 94% 93%  Weight:      Height:       SpO2: 93 %   Intake/Output Summary (Last 24 hours) at 11/09/2018 0803 Last data filed at 11/09/2018 0515 Gross per 24 hour  Intake 640 ml  Output 925 ml  Net -285 ml   Filed Weights   11/02/18 1146  Weight: 90.7 kg    Exam: General exam: In no acute distress. Respiratory system: Good air movement and clear to auscultation. Cardiovascular system: S1 & S2 heard, RRR. No JVD. Gastrointestinal system: Abdomen is nondistended, soft and nontender.  Central nervous system: Alert and oriented. No focal neurological deficits. Extremities: No pedal edema. Skin: No rashes, lesions or ulcers  Data Reviewed:  Labs: Basic Metabolic Panel: Recent Labs  Lab 11/02/18 1221 11/03/18 0517 11/09/18 0145  NA 135 136  --   K 3.7 3.5  --   CL 100 106  --   CO2 25 21*  --   GLUCOSE 222* 182*  --   BUN 28* 25*  --   CREATININE 1.53* 1.22 1.25*  CALCIUM 8.3* 7.3*  --    GFR Estimated Creatinine Clearance: 47.1 mL/min (A) (by C-G formula based on SCr of 1.25 mg/dL (H)). Liver Function Tests: Recent Labs  Lab 11/02/18 1221  AST 34  ALT 19  ALKPHOS 138*  BILITOT 0.9  PROT 7.3  ALBUMIN 3.3*   No results for input(s): LIPASE, AMYLASE in the last 168 hours. No results for  input(s): AMMONIA in the last 168 hours. Coagulation profile Recent Labs  Lab 11/02/18 1221  INR 1.0   COVID-19 Labs  No results for input(s): DDIMER, FERRITIN, LDH, CRP in the last 72 hours.  Lab Results  Component Value Date   SARSCOV2NAA POSITIVE (A) 11/02/2018    CBC: Recent Labs  Lab 11/02/18 1221 11/03/18 0517  WBC 6.5 4.6  NEUTROABS 4.4  --   HGB 13.3 12.0*  HCT 43.0 37.3*  MCV 97.9 96.1  PLT 169 145*   Cardiac Enzymes: Recent Labs  Lab 11/02/18 1221 11/03/18 0517  CKTOTAL 518* 536*   BNP (last 3 results) No results for input(s): PROBNP in the last 8760 hours. CBG: Recent Labs  Lab 11/07/18 2140 11/08/18 0716 11/08/18 1126 11/08/18 1616 11/08/18 2104  GLUCAP 203* 129* 267* 230* 187*   D-Dimer: No results for input(s): DDIMER in the last 72 hours. Hgb A1c: No results for input(s): HGBA1C in the last 72 hours. Lipid Profile: No results for input(s): CHOL, HDL, LDLCALC, TRIG, CHOLHDL, LDLDIRECT in the last 72 hours. Thyroid function studies: No results for input(s): TSH, T4TOTAL, T3FREE, THYROIDAB in the last 72 hours.  Invalid input(s): FREET3 Anemia work up: No results for input(s): VITAMINB12, FOLATE, FERRITIN, TIBC, IRON, RETICCTPCT in the last 72 hours. Sepsis Labs: Recent Labs  Lab 11/02/18 1221 11/02/18 1431 11/03/18 0517 11/03/18 1330  PROCALCITON  --   --   --  <0.10  WBC 6.5  --  4.6  --   LATICACIDVEN 2.4* 1.3  --   --    Microbiology Recent Results (from the past 240 hour(s))  Urine Culture     Status: None   Collection Time: 11/02/18 12:21 PM   Specimen: Urine, Random  Result Value Ref Range Status   Specimen Description   Final    URINE, RANDOM Performed at [facility], [address], [place], [place] [address]    Special Requests   Final    NONE Performed at [facility], [address], [place], [place] [address]    Culture   Final    Multiple bacterial morphotypes present, none predominant. Suggest appropriate  recollection if clinically indicated.   Report Status 11/03/2018 FINAL  Final  SARS CORONAVIRUS 2 (TAT 6-24 HRS) Nasopharyngeal Urine, Clean Catch     Status: Abnormal   Collection Time: 11/02/18 12:21 PM   Specimen: Urine, Clean Catch; Nasopharyngeal  Result Value Ref Range Status   SARS Coronavirus 2 POSITIVE (A) NEGATIVE Final    Comment: (NOTE) SARS-CoV-2 target nucleic acids are DETECTED. The SARS-CoV-2 RNA is generally detectable in upper and lower respiratory specimens during the acute phase of infection. Positive results are indicative of active infection with SARS-CoV-2. Clinical  correlation with  patient history and other diagnostic information is necessary to determine patient infection status. Positive results do  not rule out bacterial infection or co-infection with other viruses. The expected result is Negative. Fact Sheet for Patients: SugarRoll.be Fact Sheet for Healthcare Providers: https://www.woods-mathews.com/ This test is not yet approved or cleared by the [place] FDA and  has been authorized for detection and/or diagnosis of SARS-CoV-2 by FDA under an Emergency Use Authorization (EUA). This EUA will remain  in effect (meaning this test can be used) for the duration of the COVID-19 declaration under Section 564(b)(1) of the Act, 21 U.S.C.  section 360bbb-3(b)(1), unless the authorization is terminated or revoked sooner. Performed at [facility], [place] [address] [place], [place] [address]   Blood Culture (routine x 2)     Status: Abnormal   Collection Time: 11/02/18  2:31 PM   Specimen: Right Antecubital; Blood  Result Value Ref Range Status   Specimen Description   Final    RIGHT ANTECUBITAL Performed at [facility], [address], [place], [place] [address]    Special Requests   Final    BOTTLES DRAWN AEROBIC AND ANAEROBIC Blood Culture adequate volume Performed at [facility], [address]  [place], [place] [address]    Culture  Setup Time   Final    GRAM POSITIVE COCCI Gram Stain Report Called to,Read Back By and Verified With: HOWERTON M. AT [id number] ON [id number] BY THOMPSON S. AEROBIC BOTTLE ONLY CRITICAL RESULT CALLED TO, READ BACK BY AND VERIFIED WITH: A AMBURN RN 11/03/18 2010 JDW    Culture (A)  Final    STAPHYLOCOCCUS SPECIES (COAGULASE NEGATIVE) THE SIGNIFICANCE OF ISOLATING THIS ORGANISM FROM A SINGLE SET OF BLOOD CULTURES WHEN MULTIPLE SETS ARE DRAWN IS UNCERTAIN. PLEASE NOTIFY THE MICROBIOLOGY DEPARTMENT WITHIN ONE WEEK IF SPECIATION AND SENSITIVITIES ARE REQUIRED. Performed at [facility], [place] [address], [place], [place] [address]    Report Status 11/04/2018 FINAL  Final  Blood Culture ID Panel (Reflexed)     Status: Abnormal   Collection Time: 11/02/18  2:31 PM  Result Value Ref Range Status   Enterococcus species NOT DETECTED NOT DETECTED Final   Listeria monocytogenes NOT DETECTED NOT DETECTED Final   Staphylococcus species DETECTED (A) NOT DETECTED Final    Comment: Methicillin (oxacillin) susceptible coagulase negative staphylococcus. Possible blood culture contaminant (unless isolated from more than one blood culture draw or clinical case suggests pathogenicity). No antibiotic treatment is indicated for blood  culture contaminants. CRITICAL RESULT CALLED TO, READ BACK BY AND VERIFIED WITH: A AMBURN RN 11/03/18 2010 JDW    Staphylococcus aureus (BCID) NOT DETECTED NOT DETECTED Final   Methicillin resistance NOT DETECTED NOT DETECTED Final   Streptococcus species NOT DETECTED NOT DETECTED Final   Streptococcus agalactiae NOT DETECTED NOT DETECTED Final   Streptococcus pneumoniae NOT DETECTED NOT DETECTED Final   Streptococcus pyogenes NOT DETECTED NOT DETECTED Final   Acinetobacter baumannii NOT DETECTED NOT DETECTED Final   Enterobacteriaceae species NOT DETECTED NOT DETECTED Final   Enterobacter cloacae complex NOT DETECTED NOT DETECTED Final   Escherichia  coli NOT DETECTED NOT DETECTED Final   Klebsiella oxytoca NOT DETECTED NOT DETECTED Final   Klebsiella pneumoniae NOT DETECTED NOT DETECTED Final   Proteus species NOT DETECTED NOT DETECTED Final   Serratia marcescens NOT DETECTED NOT DETECTED Final   Haemophilus influenzae NOT DETECTED NOT DETECTED Final   Neisseria meningitidis NOT DETECTED NOT DETECTED Final   Pseudomonas aeruginosa NOT DETECTED  NOT DETECTED Final   Candida albicans NOT DETECTED NOT DETECTED Final   Candida glabrata NOT DETECTED NOT DETECTED Final   Candida krusei NOT DETECTED NOT DETECTED Final   Candida parapsilosis NOT DETECTED NOT DETECTED Final   Candida tropicalis NOT DETECTED NOT DETECTED Final    Comment: Performed at [facility], [place] [address] [place], [place] [address]  Blood Culture (routine x 2)     Status: None   Collection Time: 11/02/18  2:40 PM   Specimen: BLOOD LEFT HAND  Result Value Ref Range Status   Specimen Description BLOOD LEFT HAND  Final   Special Requests   Final    BOTTLES DRAWN AEROBIC ONLY Blood Culture results may not be optimal due to an inadequate volume of blood received in culture bottles   Culture   Final    NO GROWTH 5 DAYS Performed at [facility], [address], [place], [place] [address]    Report Status 11/07/2018 FINAL  Final  Urine Culture     Status: Abnormal   Collection Time: 11/03/18  1:56 PM   Specimen: Urine, Catheterized  Result Value Ref Range Status   Specimen Description   Final    URINE, CATHETERIZED Performed at [facility], [address] [place], [place] [address]    Special Requests   Final    Normal Performed at [facility], [address], [place], [place] [address]    Culture MULTIPLE SPECIES PRESENT, SUGGEST RECOLLECTION (A)  Final   Report Status 11/04/2018 FINAL  Final     Medications:   . amLODipine  10 mg Oral Daily  . colchicine  0.6 mg Oral q morning - 10a  . enoxaparin (LOVENOX) injection  40 mg Subcutaneous Q24H  .  guaiFENesin  600 mg Oral BID  . insulin aspart  0-5 Units Subcutaneous QHS  . insulin aspart  0-9 Units Subcutaneous TID WC  . insulin detemir  5 Units Subcutaneous BID  . lisinopril  40 mg Oral q morning - 10a  . memantine  10 mg Oral BID  . metoprolol succinate  25 mg Oral q morning - 10a  . polyethylene glycol  17 g Oral Daily  . senna-docusate  2 tablet Oral QHS  . tamsulosin  0.4 mg Oral QPC breakfast  . vitamin C  500 mg Oral BID  . zinc sulfate  220 mg Oral Daily   Continuous Infusions:    LOS: 6 days   Charlynne Cousins  [facility]  11/09/2018, 8:03 AM

## 2018-11-09 NOTE — TOC Initial Note (Addendum)
Transition of Care [facility]) - Initial/Assessment Note    Patient Details  Name: Mike Briggs MRN: [id number] Date of Birth: [birth year]  Transition of Care [facility]) CM/SW Contact:    Geralynn Ochs, LCSW Phone Number: 11/09/2018, 11:39 AM  Clinical Narrative:   CSW contacted patient's niece/HCPOA Stanton Kidney by phone to discuss discharge plan. CSW discussed that there was report that the patient's home health aide was not comfortable working with the patient while he has [place] said that she hadn't heard that. CSW explained recommendation for SNF, and Stanton Kidney wants patient to return home. Mary to contact home health aide to ensure that she will still come out to work with him, and will call CSW back. CSW asked about best contact numbers and discovered that the home phone number listed in the chart is incorrect; CSW corrected phone number listed. Will await call back from [facility] on whether patient will be able to go home with support from home health aide.               UPDATE: Stanton Kidney called back that she spoke to the home health aide, but the aide will have to talk to her manager tomorrow to find out if she is able to work with the patient or not. Aide will not be able to ask her manager until tomorrow morning. CSW to continue to follow.  Expected Discharge Plan: [place] Barriers to Discharge: Unsafe home situation   Patient Goals and CMS Choice Patient states their goals for this hospitalization and ongoing recovery are:: to get back home CMS Medicare.gov Compare Post Acute Care list provided to:: Patient Represenative (must comment) Choice offered to / list presented to : [facility] POA / Guardian  Expected Discharge Plan and Services Expected Discharge Plan: [place]       Living arrangements for the past 2 months: Apartment                                      Prior Living Arrangements/Services Living arrangements for the past 2 months:  Apartment Lives with:: Self Patient language and need for interpreter reviewed:: No Do you feel safe going back to the place where you live?: Yes      Need for Family Participation in Patient Care: Yes (Comment)   Current home services: DME, Sitter Criminal Activity/Legal Involvement Pertinent to Current Situation/Hospitalization: No - Comment as needed  Activities of Daily Living Home Assistive Devices/Equipment: Transport planner, Radio producer (specify quad or straight), Environmental consultant (specify type), Wheelchair ADL Screening (condition at time of admission) Patient's cognitive ability adequate to safely complete daily activities?: Yes Is the patient deaf or have difficulty hearing?: No Does the patient have difficulty seeing, even when wearing glasses/contacts?: No Does the patient have difficulty concentrating, remembering, or making decisions?: No Patient able to express need for assistance with ADLs?: Yes Does the patient have difficulty dressing or bathing?: Yes Independently performs ADLs?: No Communication: Independent Dressing (OT): Needs assistance Is this a change from baseline?: Pre-admission baseline Grooming: Needs assistance Is this a change from baseline?: Pre-admission baseline Feeding: Needs assistance Is this a change from baseline?: Pre-admission baseline Bathing: Needs assistance Is this a change from baseline?: Pre-admission baseline Toileting: Needs assistance Is this a change from baseline?: Pre-admission baseline In/Out Bed: Needs assistance Is this a change from baseline?: Pre-admission baseline Walks in Home: Needs assistance Is this  a change from baseline?: Pre-admission baseline Does the patient have difficulty walking or climbing stairs?: Yes Weakness of Legs: Both Weakness of Arms/Hands: None  Permission Sought/Granted Permission sought to share information with : Family Supports Permission granted to share information with : Yes, Verbal Permission  Granted  Share Information with NAME: Stanton Kidney     Permission granted to share info w Relationship: Niece/HCPOA     Emotional Assessment       Orientation: : Oriented to Self, Oriented to Place, Oriented to Situation      Admission diagnosis:  Dehydration [E86.0] Lower urinary tract infectious disease [N39.0] Generalized weakness [R53.1] COVID-19 virus detected [U07.1] Patient Active Problem List   Diagnosis Date Noted  . Pressure injury of skin 11/08/2018  . COVID-19 virus infection 11/03/2018  . COVID-19 virus detected 11/03/2018  . Gram-positive bacteremia 11/03/2018  . Dehydration 11/02/2018  . Rhabdomyolysis 11/02/2018  . UTI (urinary tract infection) 02/26/2018  . Syncope 02/25/2018  . Vitamin D deficiency 05/31/2016  . Essential hypertension 05/22/2016  . Hemiparesis affecting right side as late effect of cerebrovascular accident (CVA) ([place]) 05/22/2016  . Personal history of gout 05/22/2016  . BPH (benign prostatic hyperplasia) 05/22/2016  . Chronic constipation 05/22/2016  . HLD (hyperlipidemia) 05/22/2016  . Pseudophakia 12/06/2015  . Vascular dementia ([place]) 10/23/2012  . Chronic kidney disease, stage III (moderate) ([place]) 10/20/2012  . Type 2 diabetes mellitus with diabetic chronic kidney disease ([place]) 12/16/2008  . ANKLE, ARTHRITIS, DEGEN./OSTEO 12/16/2008   PCP:  Rosita Fire, MD Pharmacy:   [facility] [place] [address] Phone: [phone] Fax: [phone]     Social Determinants of Health (SDOH) Interventions    Readmission Risk Interventions No flowsheet data found.

## 2018-11-10 LAB — GLUCOSE, CAPILLARY
Glucose-Capillary: 330 mg/dL — ABNORMAL HIGH (ref 70–99)
Glucose-Capillary: 136 mg/dL — ABNORMAL HIGH (ref 70–99)
Glucose-Capillary: 251 mg/dL — ABNORMAL HIGH (ref 70–99)
Glucose-Capillary: 193 mg/dL — ABNORMAL HIGH (ref 70–99)
Glucose-Capillary: 232 mg/dL — ABNORMAL HIGH (ref 70–99)

## 2018-11-10 MED ORDER — METFORMIN HCL 500 MG PO TABS: 500.0000 mg | ORAL_TABLET | Freq: Two times a day (BID) | ORAL | 11 refills | Status: DC

## 2018-11-10 MED ORDER — AMLODIPINE BESYLATE 10 MG PO TABS: 10.0000 mg | ORAL_TABLET | Freq: Every day | ORAL | 0 refills | Status: DC

## 2018-11-10 NOTE — Plan of Care (Signed)
  Problem: Education: Goal: Knowledge of General Education information will improve Description: Including pain rating scale, medication(s)/side effects and non-pharmacologic comfort measures Outcome: Adequate for Discharge   Problem: Health Behavior/Discharge Planning: Goal: Ability to manage health-related needs will improve Outcome: Adequate for Discharge   Problem: Clinical Measurements: Goal: Ability to maintain clinical measurements within normal limits will improve Outcome: Adequate for Discharge Goal: Will remain free from infection Outcome: Adequate for Discharge Goal: Diagnostic test results will improve Outcome: Adequate for Discharge Goal: Respiratory complications will improve Outcome: Adequate for Discharge Goal: Cardiovascular complication will be avoided Outcome: Adequate for Discharge   Problem: Activity: Goal: Risk for activity intolerance will decrease Outcome: Adequate for Discharge   Problem: Nutrition: Goal: Adequate nutrition will be maintained Outcome: Adequate for Discharge   Problem: Coping: Goal: Level of anxiety will decrease Outcome: Adequate for Discharge   Problem: Elimination: Goal: Will not experience complications related to bowel motility Outcome: Adequate for Discharge Goal: Will not experience complications related to urinary retention Outcome: Adequate for Discharge   Problem: Pain Managment: Goal: General experience of comfort will improve Outcome: Adequate for Discharge   Problem: Safety: Goal: Ability to remain free from injury will improve Outcome: Adequate for Discharge   Problem: Skin Integrity: Goal: Risk for impaired skin integrity will decrease Outcome: Adequate for Discharge   Problem: Acute Rehab OT Goals (only OT should resolve) Goal: Pt. Will Perform Upper Body Bathing Outcome: Adequate for Discharge Goal: Pt. Will Perform Lower Body Bathing Outcome: Adequate for Discharge Goal: Pt. Will Transfer To  Toilet Outcome: Adequate for Discharge Goal: OT Additional ADL Goal #1 Outcome: Adequate for Discharge   Problem: Acute Rehab PT Goals(only PT should resolve) Goal: Patient Will Transfer Sit To/From Stand Outcome: Adequate for Discharge Goal: Pt Will Transfer Bed To Chair/Chair To Bed Outcome: Adequate for Discharge Goal: Pt/caregiver will Perform Home Exercise Program Outcome: Adequate for Discharge

## 2018-11-10 NOTE — Plan of Care (Signed)
  Problem: Education: Goal: Knowledge of General Education information will improve Description: Including pain rating scale, medication(s)/side effects and non-pharmacologic comfort measures Outcome: Progressing   Problem: Clinical Measurements: Goal: Ability to maintain clinical measurements within normal limits will improve Outcome: Progressing Goal: Will remain free from infection Outcome: Progressing Goal: Respiratory complications will improve Outcome: Progressing Goal: Cardiovascular complication will be avoided Outcome: Progressing   Problem: Activity: Goal: Risk for activity intolerance will decrease Outcome: Progressing   Problem: Nutrition: Goal: Adequate nutrition will be maintained Outcome: Progressing   

## 2018-11-10 NOTE — Discharge Summary (Signed)
Physician Discharge Summary  Mike Briggs [id number] DOB: 10-22-1928 DOA: 11/02/2018  PCP: Rosita Fire, MD  Admit date: 11/02/2018 Discharge date: 11/10/2018  Admitted From: Home Disposition:  SNF  Recommendations for Outpatient Follow-up:  1. Follow up with PCP in 1-2 weeks 2. Please obtain BMP/CBC in one week. 3. Palliative care to meet at facility to discuss goals of care and [facility]:No Equipment/Devices:None  Discharge Condition:Stable CODE STATUS:Full Diet recommendation: Heart Healthy  Brief/Interim Summary: 85 y.o. male past medical history of chronic kidney disease stage III, essential hypertension wheelchair-bound lives at an dependent living facility admitted on 11/02/2018 fever generalized weakness fatigue nonproductive cough COVID-19 PCR was positive, admitted to [facility] on 11/03/2018.  He also had dysuria on admission  Discharge Diagnoses:  Principal Problem:   UTI (urinary tract infection) Active Problems:   Type 2 diabetes mellitus with diabetic chronic kidney disease (HCC)   Chronic kidney disease, stage III (moderate) (HCC)   Vascular dementia (HCC)   Essential hypertension   BPH (benign prostatic hyperplasia)   HLD (hyperlipidemia)   Dehydration   Rhabdomyolysis   COVID-19 virus infection   COVID-19 virus detected   Gram-positive bacteremia   Pressure injury of skin Covid-19 viral infection: The patient remained asymptomatic satting greater 92% on room air no cough or shortness of breath. Patient lives at home with a private caregiver, as the patient is positive for SARS-CoV-2 he can not go back home.  Social worker was consulted for skilled nursing facility placement.  Dysuria likely due to UTI: Urine culture was done that showed multiple organism, blood cultures grew staph coagulase-negative 1 out of 2. He completed his course of IV Rocephin in-house.  Chronic kidney disease stage III: Creatinine remained at  baseline.  Diabetes mellitus type 2 Leukosis stable, his A1c was 9 on admission. He was started on oral metformin which we continue as an outpatient.  Essential hypertension: Continue current regimen no changes made.  Debilitated/conditioning: Currently wheelchair-bound, due to his SARS-CoV-2 positivity he cannot go back home for caregiver to provide care. He will have to go to skilled nursing facility.  Discharge Instructions  Discharge Instructions    Diet - low sodium heart healthy   Complete by: As directed    Increase activity slowly   Complete by: As directed      Allergies as of 11/10/2018   No Known Allergies     Medication List    TAKE these medications   amLODipine 10 MG tablet Commonly known as: NORVASC Take 1 tablet (10 mg total) by mouth daily.   atorvastatin 10 MG tablet Commonly known as: LIPITOR TAKE 1 TABLET BY MOUTH AFTER SUPPER. What changed: See the new instructions.   colchicine 0.6 MG tablet Take 1 tablet (0.6 mg total) by mouth daily. What changed: when to take this   lisinopril 40 MG tablet Commonly known as: ZESTRIL TAKE (1) TABLET BY MOUTH ONCE DAILY. What changed: See the new instructions.   memantine 10 MG tablet Commonly known as: NAMENDA Take 10 mg by mouth 2 (two) times daily.   metFORMIN 500 MG tablet Commonly known as: Glucophage Take 1 tablet (500 mg total) by mouth 2 (two) times daily with a meal.   metoprolol succinate 25 MG 24 hr tablet Commonly known as: TOPROL-XL TAKE (1) TABLET BY MOUTH ONCE DAILY. What changed: See the new instructions.   tamsulosin 0.4 MG Caps capsule Commonly known as: FLOMAX Take 1 capsule (0.4 mg total) by mouth daily  after breakfast.       No Known Allergies  Consultations:  None   Procedures/Studies: Dg Chest Portable 1 View  Result Date: 11/02/2018 CLINICAL DATA:  Cough, increased weakness EXAM: PORTABLE CHEST 1 VIEW COMPARISON:  02/25/2018, 10/20/2012 FINDINGS: The heart size  and mediastinal contours are within normal limits. Underpenetrated AP portable examination without acute appearing abnormality of the lungs. The visualized skeletal structures are unremarkable. IMPRESSION: Underpenetrated AP portable examination without acute appearing abnormality of the lungs. Electronically Signed   By: Eddie Candle M.D.   On: 11/02/2018 13:08    (Echo, Carotid, EGD, Colonoscopy, ERCP)    Subjective: In a good mood today wanting to be discharged.  Discharge Exam: Vitals:   11/10/18 0400 11/10/18 0709  BP: 140/73 (!) 150/78  Pulse: 87 92  Resp:  17  Temp: 98.2 F (36.8 C) 98.7 F (37.1 C)  SpO2: 92% 91%   Vitals:   11/09/18 1639 11/09/18 2100 11/10/18 0400 11/10/18 0709  BP: (!) 139/59 (!) 143/68 140/73 (!) 150/78  Pulse: 91 90 87 92  Resp: 16 19  17   Temp: 98.6 F (37 C) 98.7 F (37.1 C) 98.2 F (36.8 C) 98.7 F (37.1 C)  TempSrc: Oral Oral Oral Oral  SpO2: 93%  92% 91%  Weight:      Height:        General: Pt is alert, awake, not in acute distress Cardiovascular: RRR, S1/S2 +, no rubs, no gallops Respiratory: CTA bilaterally, no wheezing, no rhonchi Abdominal: Soft, NT, ND, bowel sounds + Extremities: no edema, no cyanosis    The results of significant diagnostics from this hospitalization (including imaging, microbiology, ancillary and laboratory) are listed below for reference.     Microbiology: Recent Results (from the past 240 hour(s))  Urine Culture     Status: None   Collection Time: 11/02/18 12:21 PM   Specimen: Urine, Random  Result Value Ref Range Status   Specimen Description   Final    URINE, RANDOM Performed at [facility], [address] [place], [place] [address]    Special Requests   Final    NONE Performed at [facility], [address] [place], [place] [address]    Culture   Final    Multiple bacterial morphotypes present, none predominant. Suggest appropriate recollection if clinically indicated.   Report Status  11/03/2018 FINAL  Final  SARS CORONAVIRUS 2 (TAT 6-24 HRS) Nasopharyngeal Urine, Clean Catch     Status: Abnormal   Collection Time: 11/02/18 12:21 PM   Specimen: Urine, Clean Catch; Nasopharyngeal  Result Value Ref Range Status   SARS Coronavirus 2 POSITIVE (A) NEGATIVE Final    Comment: (NOTE) SARS-CoV-2 target nucleic acids are DETECTED. The SARS-CoV-2 RNA is generally detectable in upper and lower respiratory specimens during the acute phase of infection. Positive results are indicative of active infection with SARS-CoV-2. Clinical  correlation with patient history and other diagnostic information is necessary to determine patient infection status. Positive results do  not rule out bacterial infection or co-infection with other viruses. The expected result is Negative. Fact Sheet for Patients: SugarRoll.be Fact Sheet for Healthcare Providers: https://www.woods-mathews.com/ This test is not yet approved or cleared by the [place] FDA and  has been authorized for detection and/or diagnosis of SARS-CoV-2 by FDA under an Emergency Use Authorization (EUA). This EUA will remain  in effect (meaning this test can be used) for the duration of the COVID-19 declaration under Section 564(b)(1) of the Act, 21 U.S.C.  section 360bbb-3(b)(1), unless the authorization is terminated or revoked sooner. Performed at [facility], [place] [address], [place], [place] [address]   Blood Culture (routine x 2)     Status: Abnormal   Collection Time: 11/02/18  2:31 PM   Specimen: Right Antecubital; Blood  Result Value Ref Range Status   Specimen Description   Final    RIGHT ANTECUBITAL Performed at [facility], [address], [place], [place] [address]    Special Requests   Final    BOTTLES DRAWN AEROBIC AND ANAEROBIC Blood Culture adequate volume Performed at [facility], [address] [place], [place] [address]    Culture  Setup Time   Final     GRAM POSITIVE COCCI Gram Stain Report Called to,Read Back By and Verified With: HOWERTON M. AT [id number] ON [id number] BY THOMPSON S. AEROBIC BOTTLE ONLY CRITICAL RESULT CALLED TO, READ BACK BY AND VERIFIED WITH: A AMBURN RN 11/03/18 2010 JDW    Culture (A)  Final    STAPHYLOCOCCUS SPECIES (COAGULASE NEGATIVE) THE SIGNIFICANCE OF ISOLATING THIS ORGANISM FROM A SINGLE SET OF BLOOD CULTURES WHEN MULTIPLE SETS ARE DRAWN IS UNCERTAIN. PLEASE NOTIFY THE MICROBIOLOGY DEPARTMENT WITHIN ONE WEEK IF SPECIATION AND SENSITIVITIES ARE REQUIRED. Performed at [facility], [place] [address], [place], [place] [address]    Report Status 11/04/2018 FINAL  Final  Blood Culture ID Panel (Reflexed)     Status: Abnormal   Collection Time: 11/02/18  2:31 PM  Result Value Ref Range Status   Enterococcus species NOT DETECTED NOT DETECTED Final   Listeria monocytogenes NOT DETECTED NOT DETECTED Final   Staphylococcus species DETECTED (A) NOT DETECTED Final    Comment: Methicillin (oxacillin) susceptible coagulase negative staphylococcus. Possible blood culture contaminant (unless isolated from more than one blood culture draw or clinical case suggests pathogenicity). No antibiotic treatment is indicated for blood  culture contaminants. CRITICAL RESULT CALLED TO, READ BACK BY AND VERIFIED WITH: A AMBURN RN 11/03/18 2010 JDW    Staphylococcus aureus (BCID) NOT DETECTED NOT DETECTED Final   Methicillin resistance NOT DETECTED NOT DETECTED Final   Streptococcus species NOT DETECTED NOT DETECTED Final   Streptococcus agalactiae NOT DETECTED NOT DETECTED Final   Streptococcus pneumoniae NOT DETECTED NOT DETECTED Final   Streptococcus pyogenes NOT DETECTED NOT DETECTED Final   Acinetobacter baumannii NOT DETECTED NOT DETECTED Final   Enterobacteriaceae species NOT DETECTED NOT DETECTED Final   Enterobacter cloacae complex NOT DETECTED NOT DETECTED Final   Escherichia coli NOT DETECTED NOT DETECTED Final   Klebsiella  oxytoca NOT DETECTED NOT DETECTED Final   Klebsiella pneumoniae NOT DETECTED NOT DETECTED Final   Proteus species NOT DETECTED NOT DETECTED Final   Serratia marcescens NOT DETECTED NOT DETECTED Final   Haemophilus influenzae NOT DETECTED NOT DETECTED Final   Neisseria meningitidis NOT DETECTED NOT DETECTED Final   Pseudomonas aeruginosa NOT DETECTED NOT DETECTED Final   Candida albicans NOT DETECTED NOT DETECTED Final   Candida glabrata NOT DETECTED NOT DETECTED Final   Candida krusei NOT DETECTED NOT DETECTED Final   Candida parapsilosis NOT DETECTED NOT DETECTED Final   Candida tropicalis NOT DETECTED NOT DETECTED Final    Comment: Performed at [facility], [place] [place], [place] [address]  Blood Culture (routine x 2)     Status: None   Collection Time: 11/02/18  2:40 PM   Specimen: BLOOD LEFT HAND  Result Value Ref Range Status   Specimen Description BLOOD LEFT HAND  Final   Special Requests   Final    BOTTLES DRAWN AEROBIC ONLY Blood Culture results may not be optimal due to an inadequate volume of blood received in culture bottles   Culture   Final    NO GROWTH 5 DAYS Performed at [facility], [address] [place], [place] [address]    Report Status 11/07/2018 FINAL  Final  Urine Culture     Status: Abnormal   Collection Time: 11/03/18  1:56 PM   Specimen: Urine, Catheterized  Result Value Ref Range Status   Specimen Description   Final    URINE, CATHETERIZED Performed at [facility], [address] [place], [place] [address]    Special Requests   Final    Normal Performed at [facility], [address] [place], [place] [address]    Culture MULTIPLE SPECIES PRESENT, SUGGEST RECOLLECTION (A)  Final   Report Status 11/04/2018 FINAL  Final     Labs: BNP (last 3 results) No results for input(s): BNP in the last 8760 hours. Basic Metabolic Panel: Recent Labs  Lab 11/09/18 0145  CREATININE 1.25*   Liver Function Tests: No results for input(s):  AST, ALT, ALKPHOS, BILITOT, PROT, ALBUMIN in the last 168 hours. No results for input(s): LIPASE, AMYLASE in the last 168 hours. No results for input(s): AMMONIA in the last 168 hours. CBC: No results for input(s): WBC, NEUTROABS, HGB, HCT, MCV, PLT in the last 168 hours. Cardiac Enzymes: No results for input(s): CKTOTAL, CKMB, CKMBINDEX, TROPONINI in the last 168 hours. BNP: Invalid input(s): POCBNP CBG: Recent Labs  Lab 11/08/18 2104 11/09/18 0925 11/09/18 1138 11/09/18 1552 11/09/18 2132  GLUCAP 187* 144* 173* 313* 119*   D-Dimer No results for input(s): DDIMER in the last 72 hours. Hgb A1c No results for input(s): HGBA1C in the last 72 hours. Lipid Profile No results for input(s): CHOL, HDL, LDLCALC, TRIG, CHOLHDL, LDLDIRECT in the last 72 hours. Thyroid function studies No results for input(s): TSH, T4TOTAL, T3FREE, THYROIDAB in the last 72 hours.  Invalid input(s): FREET3 Anemia work up No results for input(s): VITAMINB12, FOLATE, FERRITIN, TIBC, IRON, RETICCTPCT in the last 72 hours. Urinalysis    Component Value Date/Time   COLORURINE YELLOW 11/02/2018 1221   APPEARANCEUR TURBID (A) 11/02/2018 1221   LABSPEC <1.005 (L) 11/02/2018 1221   PHURINE >9.0 (H) 11/02/2018 1221   GLUCOSEU NEGATIVE 11/02/2018 1221   HGBUR MODERATE (A) 11/02/2018 1221   BILIRUBINUR NEGATIVE 11/02/2018 1221   KETONESUR NEGATIVE 11/02/2018 1221   PROTEINUR 100 (A) 11/02/2018 1221   UROBILINOGEN 0.2 03/21/2014 1840   NITRITE POSITIVE (A) 11/02/2018 1221   LEUKOCYTESUR LARGE (A) 11/02/2018 1221   Sepsis Labs Invalid input(s): PROCALCITONIN,  WBC,  LACTICIDVEN Microbiology Recent Results (from the past 240 hour(s))  Urine Culture     Status: None   Collection Time: 11/02/18 12:21 PM   Specimen: Urine, Random  Result Value Ref Range Status   Specimen Description   Final    URINE, RANDOM Performed at [facility], [address], [place], [place] [address]    Special Requests   Final     NONE Performed at [facility], [address], [place], [place] [address]    Culture   Final    Multiple bacterial morphotypes present, none predominant. Suggest appropriate recollection if clinically indicated.   Report Status 11/03/2018 FINAL  Final  SARS CORONAVIRUS 2 (TAT 6-24 HRS) Nasopharyngeal Urine, Clean Catch     Status: Abnormal   Collection Time:  11/02/18 12:21 PM   Specimen: Urine, Clean Catch; Nasopharyngeal  Result Value Ref Range Status   SARS Coronavirus 2 POSITIVE (A) NEGATIVE Final    Comment: (NOTE) SARS-CoV-2 target nucleic acids are DETECTED. The SARS-CoV-2 RNA is generally detectable in upper and lower respiratory specimens during the acute phase of infection. Positive results are indicative of active infection with SARS-CoV-2. Clinical  correlation with patient history and other diagnostic information is necessary to determine patient infection status. Positive results do  not rule out bacterial infection or co-infection with other viruses. The expected result is Negative. Fact Sheet for Patients: SugarRoll.be Fact Sheet for Healthcare Providers: https://www.woods-mathews.com/ This test is not yet approved or cleared by the [place] FDA and  has been authorized for detection and/or diagnosis of SARS-CoV-2 by FDA under an Emergency Use Authorization (EUA). This EUA will remain  in effect (meaning this test can be used) for the duration of the COVID-19 declaration under Section 564(b)(1) of the Act, 21 U.S.C.  section 360bbb-3(b)(1), unless the authorization is terminated or revoked sooner. Performed at [facility], [place] [address], [place], [place] [address]   Blood Culture (routine x 2)     Status: Abnormal   Collection Time: 11/02/18  2:31 PM   Specimen: Right Antecubital; Blood  Result Value Ref Range Status   Specimen Description   Final    RIGHT ANTECUBITAL Performed at [facility], [address] [place], [place] [address]    Special Requests   Final    BOTTLES DRAWN AEROBIC AND ANAEROBIC Blood Culture adequate volume Performed at [facility], [address], [place], [place] [address]    Culture  Setup Time   Final    GRAM POSITIVE COCCI Gram Stain Report Called to,Read Back By and Verified With: HOWERTON M. AT [id number] ON [id number] BY THOMPSON S. AEROBIC BOTTLE ONLY CRITICAL RESULT CALLED TO, READ BACK BY AND VERIFIED WITH: A AMBURN RN 11/03/18 2010 JDW    Culture (A)  Final    STAPHYLOCOCCUS SPECIES (COAGULASE NEGATIVE) THE SIGNIFICANCE OF ISOLATING THIS ORGANISM FROM A SINGLE SET OF BLOOD CULTURES WHEN MULTIPLE SETS ARE DRAWN IS UNCERTAIN. PLEASE NOTIFY THE MICROBIOLOGY DEPARTMENT WITHIN ONE WEEK IF SPECIATION AND SENSITIVITIES ARE REQUIRED. Performed at [facility], [place] [address] [place], [place] [address]    Report Status 11/04/2018 FINAL  Final  Blood Culture ID Panel (Reflexed)     Status: Abnormal   Collection Time: 11/02/18  2:31 PM  Result Value Ref Range Status   Enterococcus species NOT DETECTED NOT DETECTED Final   Listeria monocytogenes NOT DETECTED NOT DETECTED Final   Staphylococcus species DETECTED (A) NOT DETECTED Final    Comment: Methicillin (oxacillin) susceptible coagulase negative staphylococcus. Possible blood culture contaminant (unless isolated from more than one blood culture draw or clinical case suggests pathogenicity). No antibiotic treatment is indicated for blood  culture contaminants. CRITICAL RESULT CALLED TO, READ BACK BY AND VERIFIED WITH: A AMBURN RN 11/03/18 2010 JDW    Staphylococcus aureus (BCID) NOT DETECTED NOT DETECTED Final   Methicillin resistance NOT DETECTED NOT DETECTED Final   Streptococcus species NOT DETECTED NOT DETECTED Final   Streptococcus agalactiae NOT DETECTED NOT DETECTED Final   Streptococcus pneumoniae NOT DETECTED NOT DETECTED Final   Streptococcus pyogenes NOT DETECTED NOT DETECTED Final   Acinetobacter  baumannii NOT DETECTED NOT DETECTED Final   Enterobacteriaceae species NOT DETECTED NOT DETECTED Final   Enterobacter cloacae complex NOT DETECTED NOT DETECTED Final  Escherichia coli NOT DETECTED NOT DETECTED Final   Klebsiella oxytoca NOT DETECTED NOT DETECTED Final   Klebsiella pneumoniae NOT DETECTED NOT DETECTED Final   Proteus species NOT DETECTED NOT DETECTED Final   Serratia marcescens NOT DETECTED NOT DETECTED Final   Haemophilus influenzae NOT DETECTED NOT DETECTED Final   Neisseria meningitidis NOT DETECTED NOT DETECTED Final   Pseudomonas aeruginosa NOT DETECTED NOT DETECTED Final   Candida albicans NOT DETECTED NOT DETECTED Final   Candida glabrata NOT DETECTED NOT DETECTED Final   Candida krusei NOT DETECTED NOT DETECTED Final   Candida parapsilosis NOT DETECTED NOT DETECTED Final   Candida tropicalis NOT DETECTED NOT DETECTED Final    Comment: Performed at [facility], [place] [address] [place], [place] [address]  Blood Culture (routine x 2)     Status: None   Collection Time: 11/02/18  2:40 PM   Specimen: BLOOD LEFT HAND  Result Value Ref Range Status   Specimen Description BLOOD LEFT HAND  Final   Special Requests   Final    BOTTLES DRAWN AEROBIC ONLY Blood Culture results may not be optimal due to an inadequate volume of blood received in culture bottles   Culture   Final    NO GROWTH 5 DAYS Performed at [facility], [address], [place], [place] [address]    Report Status 11/07/2018 FINAL  Final  Urine Culture     Status: Abnormal   Collection Time: 11/03/18  1:56 PM   Specimen: Urine, Catheterized  Result Value Ref Range Status   Specimen Description   Final    URINE, CATHETERIZED Performed at [facility] [address] [place], [place] [address]    Special Requests   Final    Normal Performed at [facility], [address] [place], [place] [address]    Culture MULTIPLE SPECIES PRESENT, SUGGEST RECOLLECTION (A)  Final   Report Status  11/04/2018 FINAL  Final     Time coordinating discharge: Over 40 minutes  SIGNED:   Charlynne Cousins, MD  [facility] 11/10/2018, 8:55 AM Pager   If 7PM-7AM, please contact night-coverage [email or web address] Password [email or web address]

## 2018-11-10 NOTE — Care Management (Addendum)
10:20 Spoke w Mike Briggs, niece, who wants patient to DC to home, she cannot conform that patient will have an aid at home at this point. I told her she had to find out who it will be in order for him to come home and that I would call back at 11:00 to see who it was.  Per MD patient is not reliable for his own his decisions and self care, spoke to patient briefly over the phone, I agree.  Mike Briggs states that she works and does not live close enough to be a Building control surveyor. Patient has rollator, WC, electric WC, and rollator at home. Confirmed with [facility] that he has been active in the past, Mike Briggs is agreeable to any [facility] agency. Mike Briggs, current caregiver could not be reached [phone]. She would not be able to provide care for patient. 11:10 Spoke w Mike Briggs again who states she spoke w Mike Briggs the aid who said that Mike Briggs another aid will stay w patient and she can start today. Tried to reach [place] again and left another VM requesting a callback.   Referral accepted by [facility] for [facility] RN PT OT HHA SW. Requested [place] orders from MD.  Will attempt to call Mike Briggs again later today to verify private aid and update MD to see if patient can go home once aid is verified.  14:30 Could not reach Mike Briggs, called Mike Briggs provided me with Mike Briggs's number [phone], who states that patient will be followed by [facility] out of [place]. They will provide 8 hours a day of personal care services cook/ clean/ bathe etc. Owner is Mike Briggs [phone]. Services are scheduled to start tomorrow.

## 2018-11-11 DIAGNOSIS — L89153 Pressure ulcer of sacral region, stage 3: Secondary | ICD-10-CM

## 2018-11-11 LAB — GLUCOSE, CAPILLARY: Glucose-Capillary: 152 mg/dL — ABNORMAL HIGH (ref 70–99)

## 2018-11-11 NOTE — Plan of Care (Signed)
  Problem: Elimination: Goal: Will not experience complications related to bowel motility Outcome: Progressing Goal: Will not experience complications related to urinary retention Outcome: Progressing   

## 2018-11-11 NOTE — Progress Notes (Addendum)
[facility] PROGRESS NOTE    Progress Note  Mike Briggs  [id number] DOB: [birth year] DOA: 11/02/2018 PCP: Mike Fire, MD     Brief Narrative:   Mike Briggs is an 85 y.o. male past medical history of chronic kidney disease stage III, essential hypertension wheelchair-bound lives at an dependent living facility admitted on 11/02/2018 fever generalized weakness fatigue nonproductive cough COVID-19 PCR was positive, admitted to [facility] on 11/03/2018.  He also had dysuria on admission  Assessment/Plan:   COVID-19 viral infection: Currently asymptomatic not hypoxic satting greater 92% on room air. Patient has completed his 10 days isolation treatment he can probably be discharged home with home health PT.  Dysuria likely due to UTI: Urine culture was done which was inconclusive he completed a 5-day course of IV Rocephin. Blood culture grew staph coagulase-negative 1 of the 2 likely a contaminant.  Chronic kidney disease stage III: At baseline.  Diabetes mellitus type 2: Current home regimen no changes were made.  Essential hypertension: Currently stable continue current regimen.  Debilitated/conditioning: Wheelchair-bound.  Stage III decubitus ulcer present on admission:    DVT prophylaxis: lovenxo Family Communication:niece Disposition Plan/Barrier to D/C: DC home today with home health services. Code Status:     Code Status Orders  (From admission, onward)           Start     Ordered   11/02/18 1648  Full code  Continuous     11/02/18 1647           Code Status History     Date Active Date Inactive Code Status Order ID Comments User Context   02/25/2018 2137 02/28/2018 2018 Full Code [id number]  Mike Roys, MD Inpatient   10/20/2012 1850 10/23/2012 1821 Full Code [id number]  Mike Cota, MD Inpatient   Advance Care Planning Activity      Advance Directive Documentation      Most Recent Value  Type of Advance  Directive  Healthcare Power of Attorney [Mike Briggs- niece]  Pre-existing out of facility DNR order (yellow form or pink MOST form)    "MOST" Form in Place?           IV Access:   Peripheral IV   Procedures and diagnostic studies:   No results found.   Medical Consultants:   None.  Anti-Infectives:  None  Subjective:    Mike Briggs no complaints feels great.  Objective:    Vitals:   11/10/18 2000 11/11/18 0000 11/11/18 0329 11/11/18 0600  BP: 133/64  (!) 165/84 (!) 148/75  Pulse: 88     Resp: 17  18   Temp: 99 F (37.2 C)  98.7 F (37.1 C)   TempSrc: Oral  Oral   SpO2: 95% 94% 97%   Weight:      Height:       SpO2: 97 %   Intake/Output Summary (Last 24 hours) at 11/11/2018 0747 Last data filed at 11/11/2018 0600 Gross per 24 hour  Intake 1030 ml  Output 1125 ml  Net -95 ml   Filed Weights   11/02/18 1146  Weight: 90.7 kg    Exam: General exam: In no acute distress. Respiratory system: Good air movement and clear to auscultation. Cardiovascular system: S1 & S2 heard, RRR. No JVD. Gastrointestinal system: Abdomen is nondistended, soft and nontender.  Central nervous system: Alert and oriented. No focal neurological deficits. Extremities: No pedal edema. Skin: No rashes, lesions or ulcers  Data Reviewed:  Labs: Basic Metabolic Panel: Recent Labs  Lab 11/09/18 0145  CREATININE 1.25*   GFR Estimated Creatinine Clearance: 47.1 mL/min (A) (by C-G formula based on SCr of 1.25 mg/dL (H)). Liver Function Tests: No results for input(s): AST, ALT, ALKPHOS, BILITOT, PROT, ALBUMIN in the last 168 hours. No results for input(s): LIPASE, AMYLASE in the last 168 hours. No results for input(s): AMMONIA in the last 168 hours. Coagulation profile No results for input(s): INR, PROTIME in the last 168 hours. COVID-19 Labs  No results for input(s): DDIMER, FERRITIN, LDH, CRP in the last 72 hours.  Lab Results  Component Value Date    SARSCOV2NAA POSITIVE (A) 11/02/2018    CBC: No results for input(s): WBC, NEUTROABS, HGB, HCT, MCV, PLT in the last 168 hours. Cardiac Enzymes: No results for input(s): CKTOTAL, CKMB, CKMBINDEX, TROPONINI in the last 168 hours. BNP (last 3 results) No results for input(s): PROBNP in the last 8760 hours. CBG: Recent Labs  Lab 11/10/18 0712 11/10/18 1143 11/10/18 1725 11/10/18 2107 11/11/18 0621  GLUCAP 136* 251* 193* 232* 152*   D-Dimer: No results for input(s): DDIMER in the last 72 hours. Hgb A1c: No results for input(s): HGBA1C in the last 72 hours. Lipid Profile: No results for input(s): CHOL, HDL, LDLCALC, TRIG, CHOLHDL, LDLDIRECT in the last 72 hours. Thyroid function studies: No results for input(s): TSH, T4TOTAL, T3FREE, THYROIDAB in the last 72 hours.  Invalid input(s): FREET3 Anemia work up: No results for input(s): VITAMINB12, FOLATE, FERRITIN, TIBC, IRON, RETICCTPCT in the last 72 hours. Sepsis Labs: No results for input(s): PROCALCITON, WBC, LATICACIDVEN in the last 168 hours. Microbiology Recent Results (from the past 240 hour(s))  Urine Culture     Status: None   Collection Time: 11/02/18 12:21 PM   Specimen: Urine, Random  Result Value Ref Range Status   Specimen Description   Final    URINE, RANDOM Performed at [facility], [address], [place], [place] [address]    Special Requests   Final    NONE Performed at [facility], [address], [place],  [address]    Culture   Final    Multiple bacterial morphotypes present, none predominant. Suggest appropriate recollection if clinically indicated.   Report Status 11/03/2018 FINAL  Final  SARS CORONAVIRUS 2 (TAT 6-24 HRS) Nasopharyngeal Urine, Clean Catch     Status: Abnormal   Collection Time: 11/02/18 12:21 PM   Specimen: Urine, Clean Catch; Nasopharyngeal  Result Value Ref Range Status   SARS Coronavirus 2 POSITIVE (A) NEGATIVE Final    Comment: (NOTE) SARS-CoV-2 target nucleic acids  are DETECTED. The SARS-CoV-2 RNA is generally detectable in upper and lower respiratory specimens during the acute phase of infection. Positive results are indicative of active infection with SARS-CoV-2. Clinical  correlation with patient history and other diagnostic information is necessary to determine patient infection status. Positive results do  not rule out bacterial infection or co-infection with other viruses. The expected result is Negative. Fact Sheet for Patients: SugarRoll.be Fact Sheet for Healthcare Providers: https://www.woods-mathews.com/ This test is not yet approved or cleared by the [place] FDA and  has been authorized for detection and/or diagnosis of SARS-CoV-2 by FDA under an Emergency Use Authorization (EUA). This EUA will remain  in effect (meaning this test can be used) for the duration of the COVID-19 declaration under Section 564(b)(1) of the Act, 21 U.S.C.  section 360bbb-3(b)(1), unless the authorization is terminated or revoked sooner. Performed at [facility], [place]  [address], [place], [place] [address]   Blood Culture (routine x 2)     Status: Abnormal   Collection Time: 11/02/18  2:31 PM   Specimen: Right Antecubital; Blood  Result Value Ref Range Status   Specimen Description   Final    RIGHT ANTECUBITAL Performed at [facility], [address], [place], [place] [address]    Special Requests   Final    BOTTLES DRAWN AEROBIC AND ANAEROBIC Blood Culture adequate volume Performed at [facility], [address] [place], [place] [address]    Culture  Setup Time   Final    GRAM POSITIVE COCCI Gram Stain Report Called to,Read Back By and Verified With: HOWERTON M. AT [id number] ON [id number] BY THOMPSON S. AEROBIC BOTTLE ONLY CRITICAL RESULT CALLED TO, READ BACK BY AND VERIFIED WITH: A AMBURN RN 11/03/18 2010 JDW    Culture (A)  Final    STAPHYLOCOCCUS SPECIES (COAGULASE NEGATIVE) THE SIGNIFICANCE OF  ISOLATING THIS ORGANISM FROM A SINGLE SET OF BLOOD CULTURES WHEN MULTIPLE SETS ARE DRAWN IS UNCERTAIN. PLEASE NOTIFY THE MICROBIOLOGY DEPARTMENT WITHIN ONE WEEK IF SPECIATION AND SENSITIVITIES ARE REQUIRED. Performed at [facility], [place] [address] [place], [place] [address]    Report Status 11/04/2018 FINAL  Final  Blood Culture ID Panel (Reflexed)     Status: Abnormal   Collection Time: 11/02/18  2:31 PM  Result Value Ref Range Status   Enterococcus species NOT DETECTED NOT DETECTED Final   Listeria monocytogenes NOT DETECTED NOT DETECTED Final   Staphylococcus species DETECTED (A) NOT DETECTED Final    Comment: Methicillin (oxacillin) susceptible coagulase negative staphylococcus. Possible blood culture contaminant (unless isolated from more than one blood culture draw or clinical case suggests pathogenicity). No antibiotic treatment is indicated for blood  culture contaminants. CRITICAL RESULT CALLED TO, READ BACK BY AND VERIFIED WITH: A AMBURN RN 11/03/18 2010 JDW    Staphylococcus aureus (BCID) NOT DETECTED NOT DETECTED Final   Methicillin resistance NOT DETECTED NOT DETECTED Final   Streptococcus species NOT DETECTED NOT DETECTED Final   Streptococcus agalactiae NOT DETECTED NOT DETECTED Final   Streptococcus pneumoniae NOT DETECTED NOT DETECTED Final   Streptococcus pyogenes NOT DETECTED NOT DETECTED Final   Acinetobacter baumannii NOT DETECTED NOT DETECTED Final   Enterobacteriaceae species NOT DETECTED NOT DETECTED Final   Enterobacter cloacae complex NOT DETECTED NOT DETECTED Final   Escherichia coli NOT DETECTED NOT DETECTED Final   Klebsiella oxytoca NOT DETECTED NOT DETECTED Final   Klebsiella pneumoniae NOT DETECTED NOT DETECTED Final   Proteus species NOT DETECTED NOT DETECTED Final   Serratia marcescens NOT DETECTED NOT DETECTED Final   Haemophilus influenzae NOT DETECTED NOT DETECTED Final   Neisseria meningitidis NOT DETECTED NOT DETECTED Final   Pseudomonas  aeruginosa NOT DETECTED NOT DETECTED Final   Candida albicans NOT DETECTED NOT DETECTED Final   Candida glabrata NOT DETECTED NOT DETECTED Final   Candida krusei NOT DETECTED NOT DETECTED Final   Candida parapsilosis NOT DETECTED NOT DETECTED Final   Candida tropicalis NOT DETECTED NOT DETECTED Final    Comment: Performed at [facility], [place]. [address], [place], [place] [address]  Blood Culture (routine x 2)     Status: None   Collection Time: 11/02/18  2:40 PM   Specimen: BLOOD LEFT HAND  Result Value Ref Range Status   Specimen Description BLOOD LEFT HAND  Final   Special Requests   Final    BOTTLES DRAWN AEROBIC ONLY Blood Culture  results may not be optimal due to an inadequate volume of blood received in culture bottles   Culture   Final    NO GROWTH 5 DAYS Performed at [facility], [address], [place], [place] [address]    Report Status 11/07/2018 FINAL  Final  Urine Culture     Status: Abnormal   Collection Time: 11/03/18  1:56 PM   Specimen: Urine, Catheterized  Result Value Ref Range Status   Specimen Description   Final    URINE, CATHETERIZED Performed at [facility], [address] [place], [place] [address]    Special Requests   Final    Normal Performed at [facility], [address] [place], [place] [address]    Culture MULTIPLE SPECIES PRESENT, SUGGEST RECOLLECTION (A)  Final   Report Status 11/04/2018 FINAL  Final     Medications:    amLODipine  10 mg Oral Daily   colchicine  0.6 mg Oral q morning - 10a   enoxaparin (LOVENOX) injection  40 mg Subcutaneous Q24H   guaiFENesin  600 mg Oral BID   insulin aspart  0-5 Units Subcutaneous QHS   insulin aspart  0-9 Units Subcutaneous TID WC   insulin detemir  5 Units Subcutaneous BID   lisinopril  40 mg Oral q morning - 10a   memantine  10 mg Oral BID   metoprolol succinate  25 mg Oral q morning - 10a   polyethylene glycol  17 g Oral Daily   senna-docusate  2 tablet Oral QHS   tamsulosin  0.4 mg  Oral QPC breakfast   vitamin C  500 mg Oral BID   zinc sulfate  220 mg Oral Daily   Continuous Infusions:     LOS: 8 days   Charlynne Cousins  [facility]  11/11/2018, 7:47 AM

## 2018-11-11 NOTE — Care Management (Signed)
11/11/18 1629 : Case manager received call from charge stating patient had been informed that [facility] was not coming out because he was COVID +. Patient was under their care prior to admission and [facility] knew patient was at [facility]. Case manager contacted Adela Lank with [facility] and they will provide services to patient. Family notified.     Ricki Miller, RN BSN Case Manager [phone]

## 2018-11-11 NOTE — TOC Transition Note (Signed)
Transition of Care [facility]) - CM/SW Discharge Note   Patient Details  Name: Mike Briggs MRN: [id number] Date of Birth: 1928-05-22  Transition of Care [facility]) CM/SW Contact:  Ninfa Meeker, RN Phone Number: [phone] (working remotely) 11/11/2018, 9:53 AM   Clinical Narrative:  Case manager confirmed with [name]  [phone], that someone will be at patient's apartment when he arrives home. [name] is a family friend that oversees patient's care and well being. She assured me that [facility] Setting would be there. Case manager called and arranged [facility] for transport pickup at 11am., notified unit charge nurse of scheduled transport. Medical Necessity form and face sheet printed to nursing unit.   Final next level of care: [place] Barriers to Discharge: No Barriers Identified   Patient Goals and CMS Choice Patient states their goals for this hospitalization and ongoing recovery are:: to get back home CMS Medicare.gov Compare Post Acute Care list provided to:: Patient Represenative (must comment) Choice offered to / list presented to : [place] / [place]  Discharge Placement                       Discharge Plan and Services In-house Referral: NA Discharge Planning Services: CM Consult, Other - See comment(transportation) Post Acute Care Choice: Home Health          DME Arranged: N/A DME Agency: NA       HH Arranged: RN, PT, OT, Nurse's Aide, Social Work [employer] Agency: [facility] Date HH Agency Contacted: 11/10/18 Time [place]: 1115 Representative spoke with at [place]: [place] ([place]) Interventions     Readmission Risk Interventions No flowsheet data found.

## 2018-11-13 ENCOUNTER — Emergency Department (HOSPITAL_COMMUNITY): Payer: Medicare Other

## 2018-11-13 ENCOUNTER — Encounter (HOSPITAL_COMMUNITY): Payer: Self-pay

## 2018-11-13 ENCOUNTER — Inpatient Hospital Stay (HOSPITAL_COMMUNITY)
Admission: EM | Admit: 2018-11-13 | Discharge: 2018-11-18 | DRG: 069 | Disposition: A | Discharge status: Home Health | Payer: Medicare Other | Attending: Internal Medicine | Admitting: Internal Medicine

## 2018-11-13 ENCOUNTER — Other Ambulatory Visit: Payer: Self-pay

## 2018-11-13 DIAGNOSIS — N390 Urinary tract infection, site not specified: Secondary | ICD-10-CM | POA: Diagnosis present

## 2018-11-13 DIAGNOSIS — F0150 Vascular dementia without behavioral disturbance: Secondary | ICD-10-CM | POA: Diagnosis present

## 2018-11-13 DIAGNOSIS — R4781 Slurred speech: Secondary | ICD-10-CM | POA: Diagnosis present

## 2018-11-13 DIAGNOSIS — G459 Transient cerebral ischemic attack, unspecified: Principal | ICD-10-CM | POA: Diagnosis present

## 2018-11-13 DIAGNOSIS — R918 Other nonspecific abnormal finding of lung field: Secondary | ICD-10-CM | POA: Diagnosis not present

## 2018-11-13 DIAGNOSIS — N183 Chronic kidney disease, stage 3 unspecified: Secondary | ICD-10-CM | POA: Diagnosis present

## 2018-11-13 DIAGNOSIS — Z993 Dependence on wheelchair: Secondary | ICD-10-CM

## 2018-11-13 DIAGNOSIS — I69351 Hemiplegia and hemiparesis following cerebral infarction affecting right dominant side: Secondary | ICD-10-CM | POA: Diagnosis not present

## 2018-11-13 DIAGNOSIS — F329 Major depressive disorder, single episode, unspecified: Secondary | ICD-10-CM | POA: Diagnosis present

## 2018-11-13 DIAGNOSIS — G9349 Other encephalopathy: Secondary | ICD-10-CM | POA: Diagnosis present

## 2018-11-13 DIAGNOSIS — Z7984 Long term (current) use of oral hypoglycemic drugs: Secondary | ICD-10-CM

## 2018-11-13 DIAGNOSIS — R9431 Abnormal electrocardiogram [ECG] [EKG]: Secondary | ICD-10-CM | POA: Diagnosis not present

## 2018-11-13 DIAGNOSIS — M109 Gout, unspecified: Secondary | ICD-10-CM | POA: Diagnosis present

## 2018-11-13 DIAGNOSIS — R29810 Facial weakness: Secondary | ICD-10-CM | POA: Diagnosis present

## 2018-11-13 DIAGNOSIS — I6782 Cerebral ischemia: Secondary | ICD-10-CM | POA: Diagnosis not present

## 2018-11-13 DIAGNOSIS — L89153 Pressure ulcer of sacral region, stage 3: Secondary | ICD-10-CM | POA: Diagnosis present

## 2018-11-13 DIAGNOSIS — G9340 Encephalopathy, unspecified: Secondary | ICD-10-CM

## 2018-11-13 DIAGNOSIS — U071 COVID-19: Secondary | ICD-10-CM | POA: Diagnosis not present

## 2018-11-13 DIAGNOSIS — L98429 Non-pressure chronic ulcer of back with unspecified severity: Secondary | ICD-10-CM | POA: Diagnosis present

## 2018-11-13 DIAGNOSIS — I10 Essential (primary) hypertension: Secondary | ICD-10-CM | POA: Diagnosis present

## 2018-11-13 DIAGNOSIS — E1165 Type 2 diabetes mellitus with hyperglycemia: Secondary | ICD-10-CM | POA: Diagnosis present

## 2018-11-13 DIAGNOSIS — E785 Hyperlipidemia, unspecified: Secondary | ICD-10-CM | POA: Diagnosis present

## 2018-11-13 DIAGNOSIS — Z79899 Other long term (current) drug therapy: Secondary | ICD-10-CM

## 2018-11-13 DIAGNOSIS — R55 Syncope and collapse: Secondary | ICD-10-CM | POA: Diagnosis present

## 2018-11-13 DIAGNOSIS — E1122 Type 2 diabetes mellitus with diabetic chronic kidney disease: Secondary | ICD-10-CM | POA: Diagnosis present

## 2018-11-13 DIAGNOSIS — N400 Enlarged prostate without lower urinary tract symptoms: Secondary | ICD-10-CM | POA: Diagnosis present

## 2018-11-13 DIAGNOSIS — G0430 Acute necrotizing hemorrhagic encephalopathy, unspecified: Secondary | ICD-10-CM | POA: Diagnosis not present

## 2018-11-13 DIAGNOSIS — R471 Dysarthria and anarthria: Secondary | ICD-10-CM | POA: Diagnosis present

## 2018-11-13 DIAGNOSIS — I129 Hypertensive chronic kidney disease with stage 1 through stage 4 chronic kidney disease, or unspecified chronic kidney disease: Secondary | ICD-10-CM | POA: Diagnosis present

## 2018-11-13 DIAGNOSIS — R29702 NIHSS score 2: Secondary | ICD-10-CM | POA: Diagnosis present

## 2018-11-13 DIAGNOSIS — I441 Atrioventricular block, second degree: Secondary | ICD-10-CM | POA: Diagnosis present

## 2018-11-13 DIAGNOSIS — J189 Pneumonia, unspecified organism: Secondary | ICD-10-CM | POA: Diagnosis not present

## 2018-11-13 DIAGNOSIS — E1151 Type 2 diabetes mellitus with diabetic peripheral angiopathy without gangrene: Secondary | ICD-10-CM | POA: Diagnosis present

## 2018-11-13 DIAGNOSIS — N179 Acute kidney failure, unspecified: Secondary | ICD-10-CM | POA: Diagnosis present

## 2018-11-13 DIAGNOSIS — R5381 Other malaise: Secondary | ICD-10-CM | POA: Diagnosis present

## 2018-11-13 DIAGNOSIS — Z87891 Personal history of nicotine dependence: Secondary | ICD-10-CM

## 2018-11-13 DIAGNOSIS — Z9049 Acquired absence of other specified parts of digestive tract: Secondary | ICD-10-CM

## 2018-11-13 DIAGNOSIS — Z981 Arthrodesis status: Secondary | ICD-10-CM

## 2018-11-13 HISTORY — DX: COVID-19: U07.1

## 2018-11-13 LAB — APTT: aPTT: 29 seconds (ref 24–36)

## 2018-11-13 LAB — ETHANOL: Alcohol, Ethyl (B): <10 mg/dL (ref <10)

## 2018-11-13 LAB — COMPREHENSIVE METABOLIC PANEL
ALT: 24 U/L (ref 0–44)
AST: 26 U/L (ref 15–41)
Albumin: 2.5 g/dL — ABNORMAL LOW (ref 3.5–5.0)
Alkaline Phosphatase: 84 U/L (ref 38–126)
Anion gap: 12 (ref 5–15)
BUN: 28 mg/dL — ABNORMAL HIGH (ref 8–23)
CO2: 24 mmol/L (ref 22–32)
Calcium: 8.6 mg/dL — ABNORMAL LOW (ref 8.9–10.3)
Chloride: 103 mmol/L (ref 98–111)
Creatinine, Ser: 1.49 mg/dL — ABNORMAL HIGH (ref 0.61–1.24)
GFR calc Af Amer: 48 mL/min — ABNORMAL LOW (ref >60)
GFR calc non Af Amer: 41 mL/min — ABNORMAL LOW (ref >60)
Glucose, Bld: 213 mg/dL — ABNORMAL HIGH (ref 70–99)
Potassium: 3.5 mmol/L (ref 3.5–5.1)
Sodium: 139 mmol/L (ref 135–145)
Total Bilirubin: 0.4 mg/dL (ref 0.3–1.2)
Total Protein: 6.7 g/dL (ref 6.5–8.1)

## 2018-11-13 LAB — DIFFERENTIAL
Abs Immature Granulocytes: 0.05 10*3/uL (ref 0.00–0.07)
Basophils Absolute: 0 10*3/uL (ref 0.0–0.1)
Basophils Relative: 0 %
Eosinophils Absolute: 0.2 10*3/uL (ref 0.0–0.5)
Eosinophils Relative: 2 %
Immature Granulocytes: 1 %
Lymphocytes Relative: 20 %
Lymphs Abs: 1.6 10*3/uL (ref 0.7–4.0)
Monocytes Absolute: 1 10*3/uL (ref 0.1–1.0)
Monocytes Relative: 12 %
Neutro Abs: 5.1 10*3/uL (ref 1.7–7.7)
Neutrophils Relative %: 65 %

## 2018-11-13 LAB — PROTIME-INR
INR: 1.1 (ref 0.8–1.2)
Prothrombin Time: 14.2 seconds (ref 11.4–15.2)

## 2018-11-13 LAB — CBG MONITORING, ED: Glucose-Capillary: 193 mg/dL — ABNORMAL HIGH (ref 70–99)

## 2018-11-13 LAB — CBC
HCT: 32.5 % — ABNORMAL LOW (ref 39.0–52.0)
Hemoglobin: 9.9 g/dL — ABNORMAL LOW (ref 13.0–17.0)
MCH: 29.8 pg (ref 26.0–34.0)
MCHC: 30.5 g/dL (ref 30.0–36.0)
MCV: 97.9 fL (ref 80.0–100.0)
Platelets: 300 10*3/uL (ref 150–400)
RBC: 3.32 MIL/uL — ABNORMAL LOW (ref 4.22–5.81)
RDW: 11.9 % (ref 11.5–15.5)
WBC: 8 10*3/uL (ref 4.0–10.5)
nRBC: 0 % (ref 0.0–0.2)

## 2018-11-13 MED ORDER — ASPIRIN 81 MG PO CHEW
324.0000 mg | CHEWABLE_TABLET | Freq: Once | ORAL | Status: AC
  Administered 2018-11-13: 324 mg via ORAL
  Filled 2018-11-13: qty 4

## 2018-11-13 MED ORDER — SODIUM CHLORIDE 0.9 % IV SOLN
1000.0000 mL | INTRAVENOUS | Status: DC
  Administered 2018-11-13: 1000 mL via INTRAVENOUS

## 2018-11-13 MED ORDER — SODIUM CHLORIDE 0.9 % IV BOLUS (SEPSIS)
500.0000 mL | Freq: Once | INTRAVENOUS | Status: AC
  Administered 2018-11-13: 500 mL via INTRAVENOUS

## 2018-11-13 NOTE — ED Notes (Addendum)
Melvon Suko [phone] great niece who found pt. Spoke with [name] who stated pt LKW was 4 pm. States pt went to sleep at 4 pm and was unresponsive when she tried to wake him around 6 pm.

## 2018-11-13 NOTE — ED Provider Notes (Signed)
[facility] EMERGENCY DEPARTMENT Provider Note   CSN: [id number] Arrival date & time: 11/13/18  1825     History   Chief Complaint Chief Complaint  Patient presents with  . Altered Mental Status    HPI Mike Briggs is a 85 y.o. male.     Level 5 caveat due to altered mental status.  History obtained from EMS.  EMS received a call that the patient was unconscious and unresponsive.  On their arrival the patient was unconscious, but would respond to painful stimuli.  They noticed left facial droop, and initiated code stroke from the field.  It is unclear as to when the patient was last seen normal.  The family members seem to have found the patient around 400 unresponsive.  Patient was recently diagnosed with COVID-19 around October 13.  The patient was also noted to have a urinary tract infection. Patient has a history of diabetes mellitus, arthritis, depression, sacral ulcer stage III, urinary tract infection, CVA with right-sided weakness.  The history is provided by the EMS personnel.  Altered Mental Status   Past Medical History:  Diagnosis Date  . Arthritis   . Cataract   . Chronic kidney disease   . COVID-19   . Depression   . Diabetes mellitus   . Hyperlipidemia   . Hypertension   . Rhabdomyolysis 11/02/2018  . Stroke [facility])     Patient Active Problem List   Diagnosis Date Noted  . Sacral decubitus ulcer, stage III ([place]) 11/11/2018  . Pressure injury of skin 11/08/2018  . COVID-19 virus infection 11/03/2018  . COVID-19 virus detected 11/03/2018  . Gram-positive bacteremia 11/03/2018  . Dehydration 11/02/2018  . Rhabdomyolysis 11/02/2018  . UTI (urinary tract infection) 02/26/2018  . Syncope 02/25/2018  . Vitamin D deficiency 05/31/2016  . Essential hypertension 05/22/2016  . Hemiparesis affecting right side as late effect of cerebrovascular accident (CVA) ([place]) 05/22/2016  . Personal history of gout 05/22/2016  . BPH (benign prostatic hyperplasia)  05/22/2016  . Chronic constipation 05/22/2016  . HLD (hyperlipidemia) 05/22/2016  . Pseudophakia 12/06/2015  . Vascular dementia ([place]) 10/23/2012  . Chronic kidney disease, stage III (moderate) ([place]) 10/20/2012  . Type 2 diabetes mellitus with diabetic chronic kidney disease ([place]) 12/16/2008  . ANKLE, ARTHRITIS, DEGEN./OSTEO 12/16/2008    Past Surgical History:  Procedure Laterality Date  . BACK SURGERY     cervical and lumbar  . CATARACT EXTRACTION    . CHOLECYSTECTOMY    . FOOT SURGERY     5th toe   . SPINE SURGERY     lumbar and cervical fusions        Home Medications    Prior to Admission medications   Medication Sig Start Date End Date Taking? Authorizing Provider  amLODipine (NORVASC) 10 MG tablet Take 1 tablet (10 mg total) by mouth daily. 11/10/18   Charlynne Cousins, MD  atorvastatin (LIPITOR) 10 MG tablet TAKE 1 TABLET BY MOUTH AFTER SUPPER. Patient taking differently: Take 10 mg by mouth every evening.  08/07/17   Caren Macadam, MD  colchicine 0.6 MG tablet Take 1 tablet (0.6 mg total) by mouth daily. Patient taking differently: Take 0.6 mg by mouth every morning.  01/10/18   Julianne Rice, MD  lisinopril (PRINIVIL,ZESTRIL) 40 MG tablet TAKE (1) TABLET BY MOUTH ONCE DAILY. Patient taking differently: Take 40 mg by mouth every morning.  05/20/17   Raylene Everts, MD  memantine (NAMENDA) 10 MG tablet Take 10 mg by mouth 2 (  two) times daily.  04/18/17   [provider]  metFORMIN (GLUCOPHAGE) 500 MG tablet Take 1 tablet (500 mg total) by mouth 2 (two) times daily with a meal. 11/10/18 11/10/19  Charlynne Cousins, MD  metoprolol succinate (TOPROL-XL) 25 MG 24 hr tablet TAKE (1) TABLET BY MOUTH ONCE DAILY. Patient taking differently: Take 25 mg by mouth every morning.  05/20/17   Raylene Everts, MD  tamsulosin [facility]) 0.4 MG CAPS capsule Take 1 capsule (0.4 mg total) by mouth daily after breakfast. 02/28/18   Murlean Iba, MD    Family  History Family History  Family history unknown: Yes    Social History Social History   Tobacco Use  . Smoking status: Former Smoker    Quit date: 08/07/1967    Years since quitting: 51.3  . Smokeless tobacco: Never Used  Substance Use Topics  . Alcohol use: No  . Drug use: No     Allergies   Patient has no known allergies.   Review of Systems Review of Systems  Unable to perform ROS: Mental status change     Physical Exam Updated Vital Signs BP 131/65 (BP Location: Right Arm)   Pulse 92   Temp 99.4 F (37.4 C) (Oral)   Resp 18   Ht 5\' 11"  (1.803 m)   Wt 90.7 kg   SpO2 95%   BMI 27.89 kg/m   Physical Exam HENT:     Mouth/Throat:     Comments: There is left facial drooping.  Airway is patent. Eyes:     Comments: There is proptosis of the left eye.  Pupils are small and sluggish to react to light.  Cardiovascular:     Rate and Rhythm: Rhythm irregularly irregular.     Heart sounds: No friction rub.  Musculoskeletal:     Right lower leg: No edema.     Left lower leg: No edema.  Skin:    Comments: Patient has a pressure ulcer at the buttocks area.  There is minimal drainage present.  No red streaking noted.  Neurological:     Comments: There are no sensory deficits of the right or left face.  There is proptosis of the left eyelid. There is left facial drooping.  The patient is able to raise the soft palate without problem.  Tongue is midline. There is some slurring of speech. Patient is able to raise the right and left upper extremity.  Grip is poor bilaterally.  No sensory deficit of the lower extremity. Pt can flex and extend toes.      ED Treatments / Results  Labs (all labs ordered are listed, but only abnormal results are displayed) Labs Reviewed  ETHANOL  PROTIME-INR  APTT  CBC  DIFFERENTIAL  COMPREHENSIVE METABOLIC PANEL  RAPID URINE DRUG SCREEN, HOSP PERFORMED  URINALYSIS, ROUTINE W REFLEX MICROSCOPIC  I-STAT CHEM 8, ED    EKG None   Radiology No results found.  Procedures Procedures (including critical care time) CRITICAL CARE Performed by: Lily Kocher Total critical care time: **45* minutes Critical care time was exclusive of separately billable procedures and treating other patients. Critical care was necessary to treat or prevent imminent or life-threatening deterioration. Critical care was time spent personally by me on the following activities: development of treatment plan with patient and/or surrogate as well as nursing, discussions with consultants, evaluation of patient's response to treatment, examination of patient, obtaining history from patient or surrogate, ordering and performing treatments and interventions, ordering and review of  laboratory studies, ordering and review of radiographic studies, pulse oximetry and re-evaluation of patient's condition.  Medications Ordered in ED Medications - No data to display   Initial Impression / Assessment and Plan / ED Course  I have reviewed the triage vital signs and the nursing notes.  Pertinent labs & imaging results that were available during my care of the patient were reviewed by me and considered in my medical decision making (see chart for details).         Final Clinical Impressions(s) / ED Diagnoses MDM  Patient noted by EMS personnel to be initially unconscious, but responsive to painful stimuli.  A CODE stroke was initiated. CBG is elevated at 193.  Electrocardiogram is negative for acute STEMI, or life-threatening arrhythmias. Pt seen by Dr Lacinda Axon.  After the second assessment in the ER, the patient was more awake and alert.  He is oriented to place.  He can give [place] his name and date of birth, he also knows that it is 2018.  Consult was obtained from teleneurology.  It is his opinion that the patient has returned to baseline.  The CT scan of the brain is negative for acute changes.  The recommendation at this time is for the patient to  receive aspirin and and be admitted to telemetry. The complete blood count shows the hemoglobin to be low at 9.9, this is lower than 10 days ago at which time the hemoglobin was 12.  The hematocrit is also slightly low at 32.5.  The white blood cell count is within normal limits at 8.  There is no shift to the left. The comprehensive metabolic panel shows the sodium, potassium, chloride, and carbon oxide to be within normal limits.  The glucose is slightly elevated at 213.  The BUN is elevated at 28, within patient's usual range.  The creatinine is elevated at 1.49, also within patient's usual range. IV fluids ordered.  Anion gap is normal at 12. Chest xray reveals bilateral interstitial opacities, new from prior xray 11/02/18. These could reflect edema vs atypical or viral infection. Recheck. Pt states he feels some better. No new neuro changes.  Case discussed with Dr Lacinda Axon.  Case discussed with Dr. Darrick Meigs [facility]) Will order In and Out cath for urine drug screen and UA. Will repeat COVID.  UA negative for infection Urine drug screen Negative. COVID test POS.  Will repage Dr Darrick Meigs with results.   Final diagnoses:  Acute encephalopathy  COVID-19 virus detected  Atypical pneumonia    ED Discharge Orders    None       Lily Kocher, PA-C 11/14/18 1051    Nat Christen, MD 11/17/18 1146

## 2018-11-13 NOTE — ED Triage Notes (Signed)
EMS reports family called EMS due to pt unresponsiveness. Pt responsive to painful stimuli upon EMS arrival. Pt given 120 ml NS en route to [facility] ED. EMS stated pt nonverbal.  Pt has left sided facial droop upon arrival to [facility] ED. Patient is now alert, oriented and able to speak.  LKW unknown at this time. Pt family member found pt unresponsive at [place] today.

## 2018-11-13 NOTE — Consult Note (Signed)
[facility] Tele[facility]urology Consult Services   Date of Service:   11/13/2018 18:49:34  Impression:     .  Acute Encephalopathy  Comments/Sign-Out: Patient presented altered. HCT showed no acute process. No alteplase was offered since patient is outside of treatment time window. Patient is a CKD with mRS4 which makes him not an ideal candidate for intervention so no need for advance imaging.  Metrics: Last Known Well: Unknown [facility] Notification Time: 11/13/2018 18:49:34 Arrival Time: 11/13/2018 18:25:00 Stamp Time: 11/13/2018 18:49:34 Time First Login Attempt: 11/13/2018 18:57:17 Video Start Time: 11/13/2018 18:57:17  Symptoms: AMS NIHSS Start Assessment Time: 11/13/2018 19:03:45 Patient is not a candidate for Alteplase/Activase. Patient was not deemed candidate for Alteplase/Activase thrombolytics because of Last Well Known Above 4.5 Hours. Video End Time: 11/13/2018 19:14:13  CT head showed no acute hemorrhage or acute core infarct.  Clinical Presentation is not Suggestive of Large Vessel Occlusive Disease  ED Physician notified of diagnostic impression and management plan on 11/13/2018 19:14:15  Our recommendations are outlined below.  Recommendations:     .  Activate Stroke Protocol Admission/Order Set     .  Stroke/Telemetry Floor     .  Neuro Checks     .  Bedside Swallow Eval     .  DVT Prophylaxis     .  IV Fluids, Normal Saline     .  Head of Bed 30 Degrees     .  Euglycemia and Avoid Hyperthermia (PRN Acetaminophen)     .  Initiate Aspirin 325 MG Daily  Routine Consultation with [facility] for Follow up Care  Sign Out:     .  Discussed with Emergency Department Provider    ------------------------------------------------------------------------------  History of Present Illness: Patient is a 85 year old Male.  Patient was brought by EMS for symptoms of AMS  Past Medical history of UTI, Hypertension, Vascular dementia, Stroke,  Gout, Hyperlipidemia, Stage III pressure ulcer presented with right facial weakness. Last seen normal is unknown at this time. He was found by family members around 10 minimally responsive with right facial weakness  There is no history of hemorrhagic complications or intracranial hemorrhage. There is no history of Recent Anticoagulants. There is no history of recent major surgery. There is no history of recent stroke.  Past Medical History:     . Hypertension     . Diabetes Mellitus     . Hyperlipidemia     . Stroke  Anticoagulant use:  No  Antiplatelet use: No  Examination: BP(131/65), Pulse(92), Blood Glucose(193) 1A: Level of Consciousness - Alert; keenly responsive + 0 1B: Ask Month and Age - Both Questions Right + 0 1C: Blink Eyes & Squeeze Hands - Performs Both Tasks + 0 2: Test Horizontal Extraocular Movements - Normal + 0 3: Test Visual Fields - No Visual Loss + 0 4: Test Facial Palsy (Use Grimace if Obtunded) - Normal symmetry + 0 5A: Test Left Arm Motor Drift - No Drift for 10 Seconds + 0 5B: Test Right Arm Motor Drift - No Drift for 10 Seconds + 0 6A: Test Left Leg Motor Drift - No Drift for 5 Seconds + 0 6B: Test Right Leg Motor Drift - No Drift for 5 Seconds + 0 7: Test Limb Ataxia (FNF/Heel-Shin) - No Ataxia + 0 8: Test Sensation - Normal; No sensory loss + 0 9: Test Language/Aphasia - Mild-Moderate Aphasia: Some Obvious Changes, Without Significant Limitation + 1 10: Test Dysarthria - Mild-Moderate Dysarthria: Slurring but can  be understood + 1 11: Test Extinction/Inattention - No abnormality + 0  NIHSS Score: 2  Patient/Family was informed the Neurology Consult would happen via TeleHealth consult by way of interactive audio and video telecommunications and consented to receiving care in this manner.     Dr Hinda Lenis Elex [place]   [facility] [phone]  Case QP:[id number]

## 2018-11-13 NOTE — ED Notes (Signed)
Notified Lily Kocher, PA of new LKW time.

## 2018-11-14 ENCOUNTER — Inpatient Hospital Stay (HOSPITAL_COMMUNITY): Payer: Medicare Other

## 2018-11-14 DIAGNOSIS — L89153 Pressure ulcer of sacral region, stage 3: Secondary | ICD-10-CM | POA: Diagnosis not present

## 2018-11-14 DIAGNOSIS — I10 Essential (primary) hypertension: Secondary | ICD-10-CM | POA: Diagnosis not present

## 2018-11-14 DIAGNOSIS — E782 Mixed hyperlipidemia: Secondary | ICD-10-CM | POA: Diagnosis not present

## 2018-11-14 DIAGNOSIS — N183 Chronic kidney disease, stage 3 unspecified: Secondary | ICD-10-CM | POA: Diagnosis not present

## 2018-11-14 DIAGNOSIS — G9340 Encephalopathy, unspecified: Secondary | ICD-10-CM

## 2018-11-14 DIAGNOSIS — G459 Transient cerebral ischemic attack, unspecified: Secondary | ICD-10-CM | POA: Diagnosis present

## 2018-11-14 DIAGNOSIS — E1165 Type 2 diabetes mellitus with hyperglycemia: Secondary | ICD-10-CM | POA: Diagnosis present

## 2018-11-14 DIAGNOSIS — F0150 Vascular dementia without behavioral disturbance: Secondary | ICD-10-CM | POA: Diagnosis not present

## 2018-11-14 DIAGNOSIS — R29810 Facial weakness: Secondary | ICD-10-CM | POA: Diagnosis present

## 2018-11-14 DIAGNOSIS — F329 Major depressive disorder, single episode, unspecified: Secondary | ICD-10-CM | POA: Diagnosis not present

## 2018-11-14 DIAGNOSIS — E785 Hyperlipidemia, unspecified: Secondary | ICD-10-CM | POA: Diagnosis present

## 2018-11-14 DIAGNOSIS — R4182 Altered mental status, unspecified: Secondary | ICD-10-CM | POA: Diagnosis not present

## 2018-11-14 DIAGNOSIS — R4781 Slurred speech: Secondary | ICD-10-CM | POA: Diagnosis present

## 2018-11-14 DIAGNOSIS — R5381 Other malaise: Secondary | ICD-10-CM | POA: Diagnosis present

## 2018-11-14 DIAGNOSIS — I69351 Hemiplegia and hemiparesis following cerebral infarction affecting right dominant side: Secondary | ICD-10-CM | POA: Diagnosis not present

## 2018-11-14 DIAGNOSIS — U071 COVID-19: Secondary | ICD-10-CM | POA: Diagnosis not present

## 2018-11-14 DIAGNOSIS — E1151 Type 2 diabetes mellitus with diabetic peripheral angiopathy without gangrene: Secondary | ICD-10-CM | POA: Diagnosis present

## 2018-11-14 DIAGNOSIS — N179 Acute kidney failure, unspecified: Secondary | ICD-10-CM | POA: Diagnosis not present

## 2018-11-14 DIAGNOSIS — I6521 Occlusion and stenosis of right carotid artery: Secondary | ICD-10-CM | POA: Diagnosis not present

## 2018-11-14 DIAGNOSIS — N390 Urinary tract infection, site not specified: Secondary | ICD-10-CM | POA: Diagnosis not present

## 2018-11-14 DIAGNOSIS — R400 Somnolence: Secondary | ICD-10-CM | POA: Diagnosis not present

## 2018-11-14 DIAGNOSIS — I129 Hypertensive chronic kidney disease with stage 1 through stage 4 chronic kidney disease, or unspecified chronic kidney disease: Secondary | ICD-10-CM | POA: Diagnosis not present

## 2018-11-14 DIAGNOSIS — I441 Atrioventricular block, second degree: Secondary | ICD-10-CM | POA: Diagnosis not present

## 2018-11-14 DIAGNOSIS — G9349 Other encephalopathy: Secondary | ICD-10-CM | POA: Diagnosis not present

## 2018-11-14 DIAGNOSIS — N400 Enlarged prostate without lower urinary tract symptoms: Secondary | ICD-10-CM | POA: Diagnosis present

## 2018-11-14 DIAGNOSIS — R29702 NIHSS score 2: Secondary | ICD-10-CM | POA: Diagnosis present

## 2018-11-14 DIAGNOSIS — L98429 Non-pressure chronic ulcer of back with unspecified severity: Secondary | ICD-10-CM | POA: Diagnosis not present

## 2018-11-14 DIAGNOSIS — E1122 Type 2 diabetes mellitus with diabetic chronic kidney disease: Secondary | ICD-10-CM | POA: Diagnosis not present

## 2018-11-14 DIAGNOSIS — M109 Gout, unspecified: Secondary | ICD-10-CM | POA: Diagnosis present

## 2018-11-14 HISTORY — DX: Encephalopathy, unspecified: G93.40

## 2018-11-14 HISTORY — DX: Transient cerebral ischemic attack, unspecified: G45.9

## 2018-11-14 LAB — URINALYSIS, ROUTINE W REFLEX MICROSCOPIC
Bilirubin Urine: NEGATIVE
Glucose, UA: NEGATIVE mg/dL
Hgb urine dipstick: NEGATIVE
Ketones, ur: NEGATIVE mg/dL
Leukocytes,Ua: NEGATIVE
Nitrite: NEGATIVE
Protein, ur: 100 mg/dL — AB
Specific Gravity, Urine: 1.01 (ref 1.005–1.030)
pH: 6 (ref 5.0–8.0)

## 2018-11-14 LAB — LIPID PANEL
Cholesterol: 146 mg/dL (ref 0–200)
HDL: 28 mg/dL — ABNORMAL LOW (ref >40)
LDL Cholesterol: 94 mg/dL (ref 0–99)
Total CHOL/HDL Ratio: 5.2 RATIO
Triglycerides: 120 mg/dL (ref <150)
VLDL: 24 mg/dL (ref 0–40)

## 2018-11-14 LAB — RAPID URINE DRUG SCREEN, HOSP PERFORMED
Amphetamines: NOT DETECTED
Barbiturates: NOT DETECTED
Benzodiazepines: NOT DETECTED
Cocaine: NOT DETECTED
Opiates: NOT DETECTED
Tetrahydrocannabinol: NOT DETECTED

## 2018-11-14 LAB — ECHOCARDIOGRAM LIMITED
Height: 71 in
Weight: 3199.32 oz

## 2018-11-14 LAB — HEMOGLOBIN A1C
Hgb A1c MFr Bld: 8.6 % — ABNORMAL HIGH (ref 4.8–5.6)
Mean Plasma Glucose: 200.12 mg/dL

## 2018-11-14 LAB — CBG MONITORING, ED
Glucose-Capillary: 178 mg/dL — ABNORMAL HIGH (ref 70–99)
Glucose-Capillary: 194 mg/dL — ABNORMAL HIGH (ref 70–99)
Glucose-Capillary: 216 mg/dL — ABNORMAL HIGH (ref 70–99)
Glucose-Capillary: 244 mg/dL — ABNORMAL HIGH (ref 70–99)

## 2018-11-14 LAB — SARS CORONAVIRUS 2 BY RT PCR (HOSPITAL ORDER, PERFORMED IN ~~LOC~~ HOSPITAL LAB): SARS Coronavirus 2: POSITIVE — AB

## 2018-11-14 LAB — TSH: TSH: 0.568 u[IU]/mL (ref 0.350–4.500)

## 2018-11-14 LAB — HEPARIN LEVEL (UNFRACTIONATED): Heparin Unfractionated: 0.68 IU/mL (ref 0.30–0.70)

## 2018-11-14 MED ORDER — ASPIRIN 325 MG PO TABS: 325.0000 mg | ORAL_TABLET | Freq: Every day | ORAL | Status: DC

## 2018-11-14 MED ORDER — ACETAMINOPHEN 160 MG/5ML PO SOLN: 650.0000 mg | ORAL | Status: DC | PRN

## 2018-11-14 MED ORDER — HEPARIN (PORCINE) 25000 UT/250ML-% IV SOLN
950.0000 [IU]/h | INTRAVENOUS | Status: DC
  Administered 2018-11-14: 1000 [IU]/h via INTRAVENOUS
  Administered 2018-11-15: 900 [IU]/h via INTRAVENOUS
  Filled 2018-11-14 (×2): qty 250

## 2018-11-14 MED ORDER — SENNOSIDES-DOCUSATE SODIUM 8.6-50 MG PO TABS
1.0000 | ORAL_TABLET | Freq: Every evening | ORAL | Status: DC | PRN
  Filled 2018-11-14: qty 1

## 2018-11-14 MED ORDER — ACETAMINOPHEN 650 MG RE SUPP: 650.0000 mg | RECTAL | Status: DC | PRN

## 2018-11-14 MED ORDER — STROKE: EARLY STAGES OF RECOVERY BOOK
Freq: Once | Status: DC
  Filled 2018-11-14: qty 1

## 2018-11-14 MED ORDER — INSULIN ASPART 100 UNIT/ML ~~LOC~~ SOLN: 0.0000 [IU] | Freq: Four times a day (QID) | SUBCUTANEOUS | Status: DC

## 2018-11-14 MED ORDER — COLCHICINE 0.6 MG PO TABS
0.6000 mg | ORAL_TABLET | Freq: Every morning | ORAL | Status: DC
  Administered 2018-11-14 – 2018-11-18 (×5): 0.6 mg via ORAL
  Filled 2018-11-14 (×5): qty 1

## 2018-11-14 MED ORDER — TAMSULOSIN HCL 0.4 MG PO CAPS
0.4000 mg | ORAL_CAPSULE | Freq: Every day | ORAL | Status: DC
  Administered 2018-11-14 – 2018-11-18 (×5): 0.4 mg via ORAL
  Filled 2018-11-14 (×5): qty 1

## 2018-11-14 MED ORDER — ENOXAPARIN SODIUM 40 MG/0.4ML ~~LOC~~ SOLN
40.0000 mg | SUBCUTANEOUS | Status: DC
  Administered 2018-11-14: 40 mg via SUBCUTANEOUS
  Filled 2018-11-14: qty 0.4

## 2018-11-14 MED ORDER — HEPARIN (PORCINE) 25000 UT/250ML-% IV SOLN
1200.0000 [IU]/h | INTRAVENOUS | Status: DC
  Administered 2018-11-14: 1200 [IU]/h via INTRAVENOUS
  Filled 2018-11-14: qty 250

## 2018-11-14 MED ORDER — ATORVASTATIN CALCIUM 10 MG PO TABS
10.0000 mg | ORAL_TABLET | Freq: Every evening | ORAL | Status: DC
  Administered 2018-11-14 – 2018-11-15 (×2): 10 mg via ORAL
  Filled 2018-11-14 (×2): qty 1

## 2018-11-14 MED ORDER — ASPIRIN 300 MG RE SUPP: 300.0000 mg | Freq: Every day | RECTAL | Status: DC

## 2018-11-14 MED ORDER — INSULIN ASPART 100 UNIT/ML ~~LOC~~ SOLN
0.0000 [IU] | Freq: Three times a day (TID) | SUBCUTANEOUS | Status: DC
  Administered 2018-11-14: 2 [IU] via SUBCUTANEOUS
  Administered 2018-11-14 (×2): 3 [IU] via SUBCUTANEOUS
  Administered 2018-11-14: 2 [IU] via SUBCUTANEOUS
  Administered 2018-11-15: 1 [IU] via SUBCUTANEOUS
  Administered 2018-11-15: 2 [IU] via SUBCUTANEOUS
  Administered 2018-11-15: 3 [IU] via SUBCUTANEOUS
  Administered 2018-11-15: 1 [IU] via SUBCUTANEOUS
  Administered 2018-11-16: 3 [IU] via SUBCUTANEOUS
  Administered 2018-11-16: 2 [IU] via SUBCUTANEOUS
  Administered 2018-11-16: 1 [IU] via SUBCUTANEOUS
  Administered 2018-11-16 – 2018-11-17 (×2): 2 [IU] via SUBCUTANEOUS
  Administered 2018-11-17: 3 [IU] via SUBCUTANEOUS
  Administered 2018-11-18 (×2): 2 [IU] via SUBCUTANEOUS
  Filled 2018-11-14 (×4): qty 1

## 2018-11-14 MED ORDER — MEMANTINE HCL 10 MG PO TABS
10.0000 mg | ORAL_TABLET | Freq: Two times a day (BID) | ORAL | Status: DC
  Administered 2018-11-14 – 2018-11-18 (×8): 10 mg via ORAL
  Filled 2018-11-14 (×14): qty 1

## 2018-11-14 MED ORDER — ASPIRIN 81 MG PO CHEW
81.0000 mg | CHEWABLE_TABLET | Freq: Every day | ORAL | Status: DC
  Administered 2018-11-15 – 2018-11-18 (×4): 81 mg via ORAL
  Filled 2018-11-14 (×4): qty 1

## 2018-11-14 MED ORDER — ACETAMINOPHEN 325 MG PO TABS: 650.0000 mg | ORAL_TABLET | ORAL | Status: DC | PRN

## 2018-11-14 MED ORDER — SODIUM CHLORIDE 0.9 % IV SOLN
INTRAVENOUS | Status: DC
  Administered 2018-11-14 – 2018-11-17 (×4): via INTRAVENOUS

## 2018-11-14 NOTE — ED Notes (Signed)
Have completely changed sheets and gown. Have fed patient. He at 50% of his meal. Is lying in bed watching football. Heparin going at 27mL per hour.

## 2018-11-14 NOTE — Progress Notes (Signed)
Per HPI: Mike Briggs  is a 85 y.o. male, with history of CKD stage III, essential hypertension, wheelchair-bound, who was just discharged from hospital after treatment for COVID-19 infection.  Patient was discharged on 11/11/2018.  Today EMS received a call that patient was unconscious and unresponsive.  On arrival of EMS patient was unconscious but will respond to painful stimuli.  They also noticed left-sided facial droop and initiated code stroke. In the ED patient mental status improved, he was evaluated by tele neurology and stroke work-up was recommended.  Initial CT head was negative.  Patient not found to be candidate for TPA. Patient denies chest pain or shortness of breath. No nausea vomiting or diarrhea. COVID-19 is again positive.  Patient was admitted for TIA versus CVA with brain MRI not demonstrating any findings of CVA at this time.  He did have a brief period of unconsciousness and discussed case with neurology Dr. Merlene Laughter who recommends EEG.  2D echocardiogram also performed with results pending.  Patient is also recommended to remain on full dose heparin given recent COVID-19 infection as well as new onset atrial fibrillation.  Will check TSH and order a.m. labs.  May require formal neurology consultation if EEG positive.  He is wheelchair-bound and has an aide at home.  Total care time: 45 minutes.

## 2018-11-14 NOTE — ED Notes (Addendum)
Date and time results received: 11/14/18 0027   Test: Covid-19 Critical Value: POSITIVE  Name of Provider Notified: Cassandra, [place]

## 2018-11-14 NOTE — H&P (Signed)
TRH H&P    Patient Demographics:    Mike Briggs, is a 85 y.o. male  MRN: [id number]  DOB - July 27, 1928  Admit Date - 11/13/2018  Referring MD/NP/PA: Lily Kocher  Outpatient Primary MD for the patient is Rosita Fire, MD  Patient coming from: Home  Chief complaint-altered mental status   HPI:    Mike Briggs  is a 85 y.o. male, with history of CKD stage III, essential hypertension, wheelchair-bound, who was just discharged from hospital after treatment for COVID-19 infection.  Patient was discharged on 11/11/2018.  Today EMS received a call that patient was unconscious and unresponsive.  On arrival of EMS patient was unconscious but will respond to painful stimuli.  They also noticed left-sided facial droop and initiated code stroke. In the ED patient mental status improved, he was evaluated by tele neurology and stroke work-up was recommended.  Initial CT head was negative.  Patient not found to be candidate for TPA. Patient denies chest pain or shortness of breath. No nausea vomiting or diarrhea. COVID-19 is again positive   Review of systems:    In addition to the HPI above,   All other systems reviewed and are negative.    Past History of the following :    Past Medical History:  Diagnosis Date  . Arthritis   . Cataract   . Chronic kidney disease   . COVID-19   . Depression   . Diabetes mellitus   . Hyperlipidemia   . Hypertension   . Rhabdomyolysis 11/02/2018  . Stroke [facility])       Past Surgical History:  Procedure Laterality Date  . BACK SURGERY     cervical and lumbar  . CATARACT EXTRACTION    . CHOLECYSTECTOMY    . FOOT SURGERY     5th toe   . SPINE SURGERY     lumbar and cervical fusions      Social History:      Social History   Tobacco Use  . Smoking status: Former Smoker    Quit date: 08/07/1967    Years since quitting: 51.3  . Smokeless tobacco: Never  Used  Substance Use Topics  . Alcohol use: No       Family History :     Family History  Family history unknown: Yes   Unable to obtain, patient is poor historian   Home Medications:   Prior to Admission medications   Medication Sig Start Date End Date Taking? Authorizing Provider  amLODipine (NORVASC) 10 MG tablet Take 1 tablet (10 mg total) by mouth daily. 11/10/18   Charlynne Cousins, MD  atorvastatin (LIPITOR) 10 MG tablet TAKE 1 TABLET BY MOUTH AFTER SUPPER. Patient taking differently: Take 10 mg by mouth every evening.  08/07/17   Caren Macadam, MD  colchicine 0.6 MG tablet Take 1 tablet (0.6 mg total) by mouth daily. Patient taking differently: Take 0.6 mg by mouth every morning.  01/10/18   Julianne Rice, MD  lisinopril (PRINIVIL,ZESTRIL) 40 MG tablet TAKE (1) TABLET BY MOUTH ONCE DAILY. Patient  taking differently: Take 40 mg by mouth every morning.  05/20/17   Raylene Everts, MD  memantine (NAMENDA) 10 MG tablet Take 10 mg by mouth 2 (two) times daily.  04/18/17   [provider]  metFORMIN (GLUCOPHAGE) 500 MG tablet Take 1 tablet (500 mg total) by mouth 2 (two) times daily with a meal. 11/10/18 11/10/19  Charlynne Cousins, MD  metoprolol succinate (TOPROL-XL) 25 MG 24 hr tablet TAKE (1) TABLET BY MOUTH ONCE DAILY. Patient taking differently: Take 25 mg by mouth every morning.  05/20/17   Raylene Everts, MD  tamsulosin [facility]) 0.4 MG CAPS capsule Take 1 capsule (0.4 mg total) by mouth daily after breakfast. 02/28/18   Murlean Iba, MD     Allergies:    No Known Allergies   Physical Exam:   Vitals  Blood pressure (!) 122/51, pulse 68, temperature 99.4 F (37.4 C), temperature source Oral, resp. rate 14, height 5\' 11"  (1.803 m), weight 90.7 kg, SpO2 94 %.  1.  General: Appears in acute distress  2. Psychiatric: Alert, oriented x3  3. Neurologic: Cranial nerves II through XII grossly intact, motor strength 5/5 in all extremities   4. HEENMT:  Atraumatic normocephalic, extraocular muscles are intact  5. Respiratory : Clear to auscultation bilaterally, no wheezing or crackles  6. Cardiovascular : S1-S2, regular, no murmur auscultated  7. Gastrointestinal:  Abdomen is soft, nontender, no organomegaly     Data Review:    CBC Recent Labs  Lab 11/13/18 1845  WBC 8.0  HGB 9.9*  HCT 32.5*  PLT 300  MCV 97.9  MCH 29.8  MCHC 30.5  RDW 11.9  LYMPHSABS 1.6  MONOABS 1.0  EOSABS 0.2  BASOSABS 0.0   ------------------------------------------------------------------------------------------------------------------  Results for orders placed or performed during the hospital encounter of 11/13/18 (from the past 48 hour(s))  Ethanol     Status: None   Collection Time: 11/13/18  6:45 PM  Result Value Ref Range   Alcohol, Ethyl (B) <10 <10 mg/dL    Comment: (NOTE) Lowest detectable limit for serum alcohol is 10 mg/dL. For medical purposes only. Performed at [facility], [address] [place], [place] [address]   Protime-INR     Status: None   Collection Time: 11/13/18  6:45 PM  Result Value Ref Range   Prothrombin Time 14.2 11.4 - 15.2 seconds   INR 1.1 0.8 - 1.2    Comment: (NOTE) INR goal varies based on device and disease states. Performed at [facility], [address] [place], [place] [address]   APTT     Status: None   Collection Time: 11/13/18  6:45 PM  Result Value Ref Range   aPTT 29 24 - 36 seconds    Comment: Performed at [facility], [address] [place], [place] [address]  CBC     Status: Abnormal   Collection Time: 11/13/18  6:45 PM  Result Value Ref Range   WBC 8.0 4.0 - 10.5 K/uL   RBC 3.32 (L) 4.22 - 5.81 MIL/uL   Hemoglobin 9.9 (L) 13.0 - 17.0 g/dL   HCT 32.5 (L) 39.0 - 52.0 %   MCV 97.9 80.0 - 100.0 fL   MCH 29.8 26.0 - 34.0 pg   MCHC 30.5 30.0 - 36.0 g/dL   RDW 11.9 11.5 - 15.5 %   Platelets 300 150 - 400 K/uL   nRBC 0.0 0.0 - 0.2 %    Comment: Performed at [facility], [place]  [address], [place], [place] [address]  Differential     Status: None   Collection Time: 11/13/18  6:45 PM  Result Value Ref Range   Neutrophils Relative % 65 %   Neutro Abs 5.1 1.7 - 7.7 K/uL   Lymphocytes Relative 20 %   Lymphs Abs 1.6 0.7 - 4.0 K/uL   Monocytes Relative 12 %   Monocytes Absolute 1.0 0.1 - 1.0 K/uL   Eosinophils Relative 2 %   Eosinophils Absolute 0.2 0.0 - 0.5 K/uL   Basophils Relative 0 %   Basophils Absolute 0.0 0.0 - 0.1 K/uL   Immature Granulocytes 1 %   Abs Immature Granulocytes 0.05 0.00 - 0.07 K/uL    Comment: Performed at [facility], [address], [place], [place] [address]  Comprehensive metabolic panel     Status: Abnormal   Collection Time: 11/13/18  6:45 PM  Result Value Ref Range   Sodium 139 135 - 145 mmol/L   Potassium 3.5 3.5 - 5.1 mmol/L   Chloride 103 98 - 111 mmol/L   CO2 24 22 - 32 mmol/L   Glucose, Bld 213 (H) 70 - 99 mg/dL   BUN 28 (H) 8 - 23 mg/dL   Creatinine, Ser 1.49 (H) 0.61 - 1.24 mg/dL   Calcium 8.6 (L) 8.9 - 10.3 mg/dL   Total Protein 6.7 6.5 - 8.1 g/dL   Albumin 2.5 (L) 3.5 - 5.0 g/dL   AST 26 15 - 41 U/L   ALT 24 0 - 44 U/L   Alkaline Phosphatase 84 38 - 126 U/L   Total Bilirubin 0.4 0.3 - 1.2 mg/dL   GFR calc non Af Amer 41 (L) >60 mL/min   GFR calc Af Amer 48 (L) >60 mL/min   Anion gap 12 5 - 15    Comment: Performed at [facility], [address] [place], [place] [address]  CBG monitoring, ED     Status: Abnormal   Collection Time: 11/13/18  6:45 PM  Result Value Ref Range   Glucose-Capillary 193 (H) 70 - 99 mg/dL  SARS Coronavirus 2 by RT PCR (hospital order, performed in [facility] hospital lab) Nasopharyngeal Nasopharyngeal Swab     Status: Abnormal   Collection Time: 11/13/18 10:09 PM   Specimen: Nasopharyngeal Swab  Result Value Ref Range   SARS Coronavirus 2 POSITIVE (A) NEGATIVE    Comment: RESULT CALLED TO, READ BACK BY AND VERIFIED WITH: J APPELT,RN @0027  11/14/18 MKELLY (NOTE) If result  is NEGATIVE SARS-CoV-2 target nucleic acids are NOT DETECTED. The SARS-CoV-2 RNA is generally detectable in upper and lower  respiratory specimens during the acute phase of infection. The lowest  concentration of SARS-CoV-2 viral copies this assay can detect is 250  copies / mL. A negative result does not preclude SARS-CoV-2 infection  and should not be used as the sole basis for treatment or other  patient management decisions.  A negative result may occur with  improper specimen collection / handling, submission of specimen other  than nasopharyngeal swab, presence of viral mutation(s) within the  areas targeted by this assay, and inadequate number of viral copies  (<250 copies / mL). A negative result must be combined with clinical  observations, patient history, and epidemiological information. If result is POSITIVE SARS-CoV-2 target nucleic acids are DETECTED. The  SARS-CoV-2 RNA is generally detectable in upper and lower  respiratory specimens during the acute phase of infection.  Positive  results are indicative of active infection with SARS-CoV-2.  Clinical  correlation  with patient history and other diagnostic information is  necessary to determine patient infection status.  Positive results do  not rule out bacterial infection or co-infection with other viruses. If result is PRESUMPTIVE POSTIVE SARS-CoV-2 nucleic acids MAY BE PRESENT.   A presumptive positive result was obtained on the submitted specimen  and confirmed on repeat testing.  While 2019 novel coronavirus  (SARS-CoV-2) nucleic acids may be present in the submitted sample  additional confirmatory testing may be necessary for epidemiological  and / or clinical management purposes  to differentiate between  SARS-CoV-2 and other Sarbecovirus currently known to infect humans.  If clinically indicated additional testing with an alternate test  methodology [phone]) is a dvised. The SARS-CoV-2 RNA is generally   detectable in upper and lower respiratory specimens during the acute  phase of infection. The expected result is Negative. Fact Sheet for Patients:  StrictlyIdeas.no Fact Sheet for Healthcare Providers: BankingDealers.co.za This test is not yet approved or cleared by the [place] FDA and has been authorized for detection and/or diagnosis of SARS-CoV-2 by FDA under an Emergency Use Authorization (EUA).  This EUA will remain in effect (meaning this test can be used) for the duration of the COVID-19 declaration under Section 564(b)(1) of the Act, 21 U.S.C. section 360bbb-3(b)(1), unless the authorization is terminated or revoked sooner. Performed at [facility], [address] [place], [place] [address]   Urine rapid drug screen (hosp performed)     Status: None   Collection Time: 11/13/18 11:57 PM  Result Value Ref Range   Opiates NONE DETECTED NONE DETECTED   Cocaine NONE DETECTED NONE DETECTED   Benzodiazepines NONE DETECTED NONE DETECTED   Amphetamines NONE DETECTED NONE DETECTED   Tetrahydrocannabinol NONE DETECTED NONE DETECTED   Barbiturates NONE DETECTED NONE DETECTED    Comment: (NOTE) DRUG SCREEN FOR MEDICAL PURPOSES ONLY.  IF CONFIRMATION IS NEEDED FOR ANY PURPOSE, NOTIFY LAB WITHIN 5 DAYS. LOWEST DETECTABLE LIMITS FOR URINE DRUG SCREEN Drug Class                     Cutoff (ng/mL) Amphetamine and metabolites    1000 Barbiturate and metabolites    200 Benzodiazepine                 [id number] Tricyclics and metabolites     300 Opiates and metabolites        300 Cocaine and metabolites        300 THC                            50 Performed at [facility], [address] [place], [place] [address]   Urinalysis, Routine w reflex microscopic     Status: Abnormal   Collection Time: 11/13/18 11:57 PM  Result Value Ref Range   Color, Urine YELLOW YELLOW   APPearance CLEAR CLEAR   Specific Gravity, Urine 1.010 1.005 - 1.030    pH 6.0 5.0 - 8.0   Glucose, UA NEGATIVE NEGATIVE mg/dL   Hgb urine dipstick NEGATIVE NEGATIVE   Bilirubin Urine NEGATIVE NEGATIVE   Ketones, ur NEGATIVE NEGATIVE mg/dL   Protein, ur 100 (A) NEGATIVE mg/dL   Nitrite NEGATIVE NEGATIVE   Leukocytes,Ua NEGATIVE NEGATIVE   RBC / HPF 0-5 0 - 5 RBC/hpf   WBC, UA 0-5 0 - 5 WBC/hpf   Bacteria, UA RARE (A) NONE SEEN   Squamous Epithelial / LPF 0-5 0 - 5  Comment: Performed at [facility], [address] [place], [place] [address]    Chemistries  Recent Labs  Lab 11/09/18 0145 11/13/18 1845  NA  --  139  K  --  3.5  CL  --  103  CO2  --  24  GLUCOSE  --  213*  BUN  --  28*  CREATININE 1.25* 1.49*  CALCIUM  --  8.6*  AST  --  26  ALT  --  24  ALKPHOS  --  84  BILITOT  --  0.4   ------------------------------------------------------------------------------------------------------------------  ------------------------------------------------------------------------------------------------------------------ GFR: Estimated Creatinine Clearance: 39.5 mL/min (A) (by C-G formula based on SCr of 1.49 mg/dL (H)). Liver Function Tests: Recent Labs  Lab 11/13/18 1845  AST 26  ALT 24  ALKPHOS 84  BILITOT 0.4  PROT 6.7  ALBUMIN 2.5*   No results for input(s): LIPASE, AMYLASE in the last 168 hours. No results for input(s): AMMONIA in the last 168 hours. Coagulation Profile: Recent Labs  Lab 11/13/18 1845  INR 1.1   Cardiac Enzymes: No results for input(s): CKTOTAL, CKMB, CKMBINDEX, TROPONINI in the last 168 hours. BNP (last 3 results) No results for input(s): PROBNP in the last 8760 hours. HbA1C: No results for input(s): HGBA1C in the last 72 hours. CBG: Recent Labs  Lab 11/10/18 1143 11/10/18 1725 11/10/18 2107 11/11/18 0621 11/13/18 1845  GLUCAP 251* 193* 232* 152* 193*   Lipid Profile: No results for input(s): CHOL, HDL, LDLCALC, TRIG, CHOLHDL, LDLDIRECT in the last 72 hours. Thyroid Function Tests: No  results for input(s): TSH, T4TOTAL, FREET4, T3FREE, THYROIDAB in the last 72 hours. Anemia Panel: No results for input(s): VITAMINB12, FOLATE, FERRITIN, TIBC, IRON, RETICCTPCT in the last 72 hours.  --------------------------------------------------------------------------------------------------------------- Urine analysis:    Component Value Date/Time   COLORURINE YELLOW 11/13/2018 2357   APPEARANCEUR CLEAR 11/13/2018 2357   LABSPEC 1.010 11/13/2018 2357   PHURINE 6.0 11/13/2018 2357   GLUCOSEU NEGATIVE 11/13/2018 2357   HGBUR NEGATIVE 11/13/2018 2357   BILIRUBINUR NEGATIVE 11/13/2018 2357   KETONESUR NEGATIVE 11/13/2018 2357   PROTEINUR 100 (A) 11/13/2018 2357   UROBILINOGEN 0.2 03/21/2014 1840   NITRITE NEGATIVE 11/13/2018 2357   LEUKOCYTESUR NEGATIVE 11/13/2018 2357      Imaging Results:    Dg Chest Portable 1 View  Result Date: 11/13/2018 CLINICAL DATA:  Altered mental status.  COVID-19 positive. EXAM: PORTABLE CHEST 1 VIEW COMPARISON:  11/02/2018 FINDINGS: Stable mild cardiomegaly. Mildly increased interstitial opacities throughout both lungs compared to prior. No focal consolidation. Possible trace right pleural effusion. No pneumothorax. IMPRESSION: Diffuse bilateral interstitial opacities, new from prior, which could reflect edema versus an atypical/viral infection. Electronically Signed   By: Davina Poke M.D.   On: 11/13/2018 21:02   Ct Head Code Stroke Wo Contrast  Addendum Date: 11/13/2018   ADDENDUM REPORT: 11/13/2018 19:51 ADDENDUM: Study discussed by telephone with Dr. Nat Christen on 11/13/2018 at [place] hours. Electronically Signed   By: Genevie Ann M.D.   On: 11/13/2018 19:51   Result Date: 11/13/2018 CLINICAL DATA:  Code stroke. 85 year old COVID-19 positive male was unresponsive with left facial droop. EXAM: CT HEAD WITHOUT CONTRAST TECHNIQUE: Contiguous axial images were obtained from the base of the skull through the vertex without intravenous contrast.  COMPARISON:  Head CT 02/25/2018. Brain MRI 10/22/2012. FINDINGS: Brain: Stable cerebral volume since January. No midline shift, mass effect, or evidence of intracranial mass lesion. No ventriculomegaly. No acute intracranial hemorrhage identified. Patchy and confluent bilateral cerebral white matter hypodensity  appears stable along with similar hypodensity suggested in the pons (series 2, image 7). No cortically based acute infarct identified. Vascular: Calcified atherosclerosis at the skull base. No suspicious intracranial vascular hyperdensity. Skull: No acute osseous abnormality identified. Sinuses/Orbits: Minor paranasal sinus mucosal thickening. Tympanic cavities and mastoids remain clear. Other: No acute orbit or scalp soft tissue findings. ASPECTS [facility] Stroke Program Early CT Score) Total score (0-10 with 10 being normal): 10 IMPRESSION: 1. Stable non contrast CT appearance of the brain since January with chronic small vessel disease. 2.  ASPECTS 10. Electronically Signed: By: Genevie Ann M.D. On: 11/13/2018 19:15    My personal review of EKG: Rhythm atrial fibrillation   Assessment & Plan:    Active Problems:   TIA (transient ischemic attack)   1. TIA versus stroke-we will initiate stroke protocol, check MRI brain in a.m.  Carotid Dopplers, lipid profile, hemoglobin A1c.  Aspirin 325 mg p.o. daily.  Patient has passed swallow screen, will initiate carb modified diet.  Hold antihypertensive medications for permissive hypertension.  2. COVID-19 infection-recent admission at [facility] for the same, he was diagnosed with COVID-19 on 11/02/2018.  Chest x-ray shows slight worsening of infiltrates bilaterally, however patient is not requiring oxygen.  He is not tachypneic.  Oxygen saturation 94% on room air.  We will continue to monitor.  3. Hypertension-we will hold metoprolol, lisinopril for permissive hypertension.  4. Hyperlipidemia-continue Lipitor.  5. BPH-continue Flomax.  6. Vascular  dementia-continue Namenda.  7. Acute kidney injury on CKD stage III-slight worsening of creatinine 1.14, previous creatinine as of 11/09/2018 was 1.25.  Start gentle IV hydration with normal saline.  Follow BMP in a.m.  8. New onset A. Fib-EKG shows atrial fibrillation, yesterday's EKG showed Mobitz type II second-degree heart block, will continue to monitor on telemetry.   Echocardiogram has been ordered.  Will not start anticoagulation at this time, considering ongoing work-up for stroke.     DVT Prophylaxis-   Lovenox  AM Labs Ordered, also please review Full Orders  Family Communication: Admission, patients condition and plan of care including tests being ordered have been discussed with the patient  who indicate understanding and agree with the plan and Code Status.  Code Status: Full code  Admission status: Inpatient: Based on patients clinical presentation and evaluation of above clinical data, I have made determination that patient meets Inpatient criteria at this time.  Time spent in minutes : 60 minutes   Oswald Hillock M.D on 11/14/2018 at 3:22 AM

## 2018-11-14 NOTE — ED Notes (Signed)
Pt unable to feed self. Have fed him his breakfast tray. Ate all of eggs and apple juice

## 2018-11-14 NOTE — Progress Notes (Signed)
Called nurse to discuss routine EEG order for patient; patient is COVID positive and being transferred to [facility] when a telemetry bed becomes available.  Will do EEG once patient is at [facility].

## 2018-11-14 NOTE — ED Notes (Signed)
Pt is going to MRI. Will give other medications when he returns.

## 2018-11-14 NOTE — ED Provider Notes (Signed)
01:40 AM Dr Darrick Meigs, given update and new labs will admit to [facility], Daleen Bo, MD 11/14/18 [phone]

## 2018-11-14 NOTE — Progress Notes (Signed)
*  PRELIMINARY RESULTS* Echocardiogram Limited 2-D Echocardiogram has been performed.  Mike Briggs 11/14/2018, 11:33 AM

## 2018-11-14 NOTE — ED Notes (Signed)
Have contacted pt's family about going to [facility]

## 2018-11-14 NOTE — Progress Notes (Signed)
ANTICOAGULATION CONSULT NOTE - Follow-Up Consult  Pharmacy Consult for Heparin Indication: atrial fibrillation  No Known Allergies  Patient Measurements: Height: 5\' 11"  (180.3 cm) Weight: 199 lb 15.3 oz (90.7 kg) IBW/kg (Calculated) : 75.3 HEPARIN DW (KG): 90.7  Vital Signs: BP: 181/67 (10/16 2212) Pulse Rate: 41 (10/16 2130)  Labs: Recent Labs    11/13/18 1845 11/14/18 2046  HGB 9.9*  --   HCT 32.5*  --   PLT 300  --   APTT 29  --   LABPROT 14.2  --   INR 1.1  --   HEPARINUNFRC  --  0.68  CREATININE 1.49*  --     Estimated Creatinine Clearance: 39.5 mL/min (A) (by C-G formula based on SCr of 1.49 mg/dL (H)).   Medical History: Past Medical History:  Diagnosis Date  . Arthritis   . Cataract   . Chronic kidney disease   . COVID-19   . Depression   . Diabetes mellitus   . Hyperlipidemia   . Hypertension   . Rhabdomyolysis 11/02/2018  . Stroke [facility])     Medications:  See med rec  Assessment: 85 y.o.male,with history of CKD stage III, essential hypertension, wheelchair-bound, was just discharged from hospital after treatment for COVID-19 infection on 11/11/2018. Today EMS received a call that patient was unconscious and unresponsive. On arrival of EMS patient was unconscious but will respond to painful stimuli. EMS noticed left-sided facial droop and initiated code stroke. Patient also with new onset afib and pharmacy asked to start heparin. Not on any oral anticoagulants PTA. With possible stroke, will not bolus with heparin.  PM update  HL is 0.68 elevated  No line or bleeding issues per RN    Goal of Therapy:  Heparin level 0.3-0.5 units/ml Monitor platelets by anticoagulation protocol: Yes   Plan:   Decrease heparin infusion to 1000 units/hr  Check anti-Xa level in ~8 hours and daily while on heparin  Continue to monitor H&H and platelets   Royetta Asal, PharmD, BCPS 11/14/2018 10:48 PM

## 2018-11-14 NOTE — Progress Notes (Signed)
ANTICOAGULATION CONSULT NOTE - Initial Consult  Pharmacy Consult for Heparin Indication: atrial fibrillation  No Known Allergies  Patient Measurements: Height: 5\' 11"  (180.3 cm) Weight: 199 lb 15.3 oz (90.7 kg) IBW/kg (Calculated) : 75.3 HEPARIN DW (KG): 90.7  Vital Signs: BP: 140/82 (10/16 1149) Pulse Rate: 73 (10/16 1149)  Labs: Recent Labs    11/13/18 1845  HGB 9.9*  HCT 32.5*  PLT 300  APTT 29  LABPROT 14.2  INR 1.1  CREATININE 1.49*    Estimated Creatinine Clearance: 39.5 mL/min (A) (by C-G formula based on SCr of 1.49 mg/dL (H)).   Medical History: Past Medical History:  Diagnosis Date  . Arthritis   . Cataract   . Chronic kidney disease   . COVID-19   . Depression   . Diabetes mellitus   . Hyperlipidemia   . Hypertension   . Rhabdomyolysis 11/02/2018  . Stroke [facility])     Medications:  See med rec  Assessment: 85 y.o.male,with history of CKD stage III, essential hypertension, wheelchair-bound, was just discharged from hospital after treatment for COVID-19 infection on 11/11/2018. Today EMS received a call that patient was unconscious and unresponsive. On arrival of EMS patient was unconscious but will respond to painful stimuli. EMS noticed left-sided facial droop and initiated code stroke. Patient also with new onset afib and pharmacy asked to start heparin. Not on any oral anticoagulants PTA. With possible stroke, will not bolus with heparin.  Goal of Therapy:  Heparin level 0.3-0.7 units/ml Monitor platelets by anticoagulation protocol: Yes   Plan:  Start heparin infusion at 1200 units/hr Check anti-Xa level in ~8 hours and daily while on heparin Continue to monitor H&H and platelets  Isac Sarna, BS Vena Austria, BCPS Clinical Pharmacist Pager [phone] 11/14/2018,12:24 PM

## 2018-11-15 ENCOUNTER — Inpatient Hospital Stay (HOSPITAL_COMMUNITY): Payer: Medicare Other

## 2018-11-15 DIAGNOSIS — I441 Atrioventricular block, second degree: Secondary | ICD-10-CM | POA: Diagnosis not present

## 2018-11-15 DIAGNOSIS — G9340 Encephalopathy, unspecified: Secondary | ICD-10-CM | POA: Diagnosis not present

## 2018-11-15 LAB — BASIC METABOLIC PANEL
Anion gap: 9 (ref 5–15)
BUN: 15 mg/dL (ref 8–23)
CO2: 24 mmol/L (ref 22–32)
Calcium: 8.3 mg/dL — ABNORMAL LOW (ref 8.9–10.3)
Chloride: 108 mmol/L (ref 98–111)
Creatinine, Ser: 0.98 mg/dL (ref 0.61–1.24)
GFR calc Af Amer: >60 mL/min (ref >60)
GFR calc non Af Amer: >60 mL/min (ref >60)
Glucose, Bld: 160 mg/dL — ABNORMAL HIGH (ref 70–99)
Potassium: 3.8 mmol/L (ref 3.5–5.1)
Sodium: 141 mmol/L (ref 135–145)

## 2018-11-15 LAB — CBC
HCT: 31.1 % — ABNORMAL LOW (ref 39.0–52.0)
Hemoglobin: 9.8 g/dL — ABNORMAL LOW (ref 13.0–17.0)
MCH: 30.2 pg (ref 26.0–34.0)
MCHC: 31.5 g/dL (ref 30.0–36.0)
MCV: 96 fL (ref 80.0–100.0)
Platelets: 318 10*3/uL (ref 150–400)
RBC: 3.24 MIL/uL — ABNORMAL LOW (ref 4.22–5.81)
RDW: 11.8 % (ref 11.5–15.5)
WBC: 6.5 10*3/uL (ref 4.0–10.5)
nRBC: 0 % (ref 0.0–0.2)

## 2018-11-15 LAB — GLUCOSE, CAPILLARY
Glucose-Capillary: 142 mg/dL — ABNORMAL HIGH (ref 70–99)
Glucose-Capillary: 144 mg/dL — ABNORMAL HIGH (ref 70–99)
Glucose-Capillary: 184 mg/dL — ABNORMAL HIGH (ref 70–99)
Glucose-Capillary: 234 mg/dL — ABNORMAL HIGH (ref 70–99)

## 2018-11-15 LAB — HEPARIN LEVEL (UNFRACTIONATED)
Heparin Unfractionated: 0.47 IU/mL (ref 0.30–0.70)
Heparin Unfractionated: 0.26 IU/mL — ABNORMAL LOW (ref 0.30–0.70)

## 2018-11-15 MED ORDER — AMLODIPINE BESYLATE 10 MG PO TABS
10.0000 mg | ORAL_TABLET | Freq: Every day | ORAL | Status: DC
  Administered 2018-11-15 – 2018-11-18 (×4): 10 mg via ORAL
  Filled 2018-11-15 (×4): qty 1

## 2018-11-15 MED ORDER — METOPROLOL SUCCINATE ER 25 MG PO TB24
25.0000 mg | ORAL_TABLET | Freq: Every morning | ORAL | Status: DC
  Administered 2018-11-15 – 2018-11-18 (×4): 25 mg via ORAL
  Filled 2018-11-15 (×4): qty 1

## 2018-11-15 NOTE — Progress Notes (Signed)
ANTICOAGULATION CONSULT NOTE - Follow-Up Consult  Pharmacy Consult for Heparin Indication: atrial fibrillation  No Known Allergies  Patient Measurements: Height: 5\' 11"  (180.3 cm) Weight: 199 lb 15.3 oz (90.7 kg) IBW/kg (Calculated) : 75.3 HEPARIN DW (KG): 90.7  Vital Signs: Temp: 97.8 F (36.6 C) (10/17 0849) Temp Source: Oral (10/17 0849) BP: 140/75 (10/17 0816) Pulse Rate: 82 (10/17 0816)  Labs: Recent Labs    11/13/18 1845 11/14/18 2046 11/15/18 0911  HGB 9.9*  --  9.8*  HCT 32.5*  --  31.1*  PLT 300  --  318  APTT 29  --   --   LABPROT 14.2  --   --   INR 1.1  --   --   HEPARINUNFRC  --  0.68 0.47  CREATININE 1.49*  --  0.98    Estimated Creatinine Clearance: 60.1 mL/min (by C-G formula based on SCr of 0.98 mg/dL).   Medical History: Past Medical History:  Diagnosis Date  . Arthritis   . Cataract   . Chronic kidney disease   . COVID-19   . Depression   . Diabetes mellitus   . Hyperlipidemia   . Hypertension   . Rhabdomyolysis 11/02/2018  . Stroke [facility])     Assessment: 85 y.o.male,with history of CKD stage III, essential hypertension, wheelchair-bound, was just discharged from hospital after treatment for COVID-19 infection on 11/11/2018.  Patient also with new onset afib and pharmacy asked to start heparin. Not on any oral anticoagulants PTA. With possible stroke, will not bolus with heparin.  Heparin level supratherapeutic at 0.47 at 1000 units/hr. CBC stable. No bleeding noted per RN.  Goal of Therapy:  Heparin level 0.3-0.5 units/ml Monitor platelets by anticoagulation protocol: Yes   Plan:   Decrease heparin infusion to 900 units/hr  Check anti-Xa level in 8 hours and daily while on heparin  Continue to monitor H&H and platelets  Berenice Bouton, PharmD PGY1 Pharmacy Resident Office phone: [phone] Phone until 3:30 pm: [phone] 11/15/2018 11:31 AM

## 2018-11-15 NOTE — Evaluation (Signed)
Physical Therapy Evaluation Patient Details Name: Mike Briggs MRN: [id number] DOB: 1928/04/15 Today's Date: 11/15/2018   History of Present Illness  85 y/o male  w/ hx of CKD III, HTN, HLD, vascular dementia, BPH, R LB pain, CVA, sciatica, w/c-bound, living at independent apartment with aid. admitted on 11/02/2018 with concerns for UTI after presenting with fevers, gen weakness, fatigue, nonproductive cough, dysuria.  Covid-19 positive on 11/03/2018, transfered to [facility] but was asymptomatic.Pt discharged on 11/11/2018. EMS called on 11/14/18 and on arrival pt unconscious and unresponsive. CT and MRI negative for stroke. Admitted 11/14/18 for r/o stroke and new onset of Afib.   Clinical Impression  PTA pt reports living alone with HHA 8hrs/7day/wk. Pt reports able to transfer to motorized chair independently with use of trapeze on his bed. Pt is aware he is in the hospital but does not know why. Has no recollection of period before passing out at home. Pt is currently modA for bed mobility, but reports he would do better in his own bed with the trapeze. Pt sat EoB for 10 min to perform exercise before getting back to bed. PT recommending SNF level rehab or 24 hour assist at home. Note from prior hospitalization state niece was trying to find 24 hour assist. PT will continue to follow acutely.    Follow Up Recommendations SNF    Equipment Recommendations  None recommended by PT    Recommendations for Other Services       Precautions / Restrictions Precautions Precautions: Fall Restrictions Weight Bearing Restrictions: No      Mobility  Bed Mobility Overal bed mobility: Needs Assistance Bed Mobility: Supine to Sit     Supine to sit: HOB elevated;Mod assist     General bed mobility comments: modA for moving LE off bed, attempted to scoot hips to EoB, however requires assist to adjust scrotum after hips moved   Transfers                 General transfer comment:  unable to attempt decreased assist available         Balance Overall balance assessment: Needs assistance Sitting-balance support: Feet supported;Bilateral upper extremity supported Sitting balance-Leahy Scale: Fair                                       Pertinent Vitals/Pain Pain Assessment: No/denies pain    Home Living Family/patient expects to be discharged to:: Private residence Living Arrangements: Alone;Other (Comment)(aide comes daily) Available Help at Discharge: Family;Personal care attendant;Available PRN/intermittently Type of Home: Apartment Home Access: Level entry     Home Layout: One level Home Equipment: Walker - 2 wheels;Bedside commode;Shower seat;Grab bars - toilet;Grab bars - tub/shower;Electric scooter;Hospital bed(trapezee on bed)      Prior Function Level of Independence: Needs assistance   Gait / Transfers Assistance Needed: primarily uses scooter; ableto transfer self into scooter independently  ADL's / Homemaking Assistance Needed: Has HHA 8 hrs/day who assits with bathing and IADL tasks; pt able to take himself to the toilet           Extremity/Trunk Assessment   Upper Extremity Assessment Upper Extremity Assessment: Generalized weakness    Lower Extremity Assessment Lower Extremity Assessment: Generalized weakness(L>R) RLE Deficits / Details: ROM WFL, strength grossly 3+/5 RLE Coordination: decreased fine motor;decreased gross motor LLE Deficits / Details: ROM WFL, strength grossly 3+/5 LLE Coordination: decreased fine motor;decreased gross motor  Cervical / Trunk Assessment Cervical / Trunk Assessment: Kyphotic  Communication   Communication: HOH  Cognition Arousal/Alertness: Awake/alert Behavior During Therapy: WFL for tasks assessed/performed Overall Cognitive Status: No family/caregiver present to determine baseline cognitive functioning                                 General Comments:  cognitive impariments at baseline; pt unaware why he was in the hospital but knew he was in a hospital; increased time for processing and problem solving      General Comments General comments (skin integrity, edema, etc.): Pt aware he is in the hospital but not why    Exercises General Exercises - Lower Extremity Ankle Circles/Pumps: AROM;Both;10 reps Long Arc Quad: AROM;Both;10 reps;Seated Hip Flexion/Marching: AROM;Both;10 reps;Seated   Assessment/Plan    PT Assessment Patient needs continued PT services  PT Problem List Decreased strength;Decreased activity tolerance;Decreased balance;Decreased cognition;Decreased mobility;Decreased coordination       PT Treatment Interventions Functional mobility training;DME instruction;Therapeutic activities;Therapeutic exercise;Balance training;Cognitive remediation;Patient/family education    PT Goals (Current goals can be found in the Care Plan section)  Acute Rehab PT Goals Patient Stated Goal: to get better PT Goal Formulation: With patient Time For Goal Achievement: 11/29/18 Potential to Achieve Goals: Fair    Frequency Min 2X/week   Barriers to discharge Decreased caregiver support(lives alone decreased cognition)      Co-evaluation               AM-PAC PT "6 Clicks" Mobility  Outcome Measure Help needed turning from your back to your side while in a flat bed without using bedrails?: A Little Help needed moving from lying on your back to sitting on the side of a flat bed without using bedrails?: A Lot Help needed moving to and from a bed to a chair (including a wheelchair)?: Total Help needed standing up from a chair using your arms (e.g., wheelchair or bedside chair)?: Total Help needed to walk in hospital room?: Total Help needed climbing 3-5 steps with a railing? : Total 6 Click Score: 9    End of Session Equipment Utilized During Treatment: Gait belt Activity Tolerance: Patient limited by fatigue Patient left:  in bed;with call bell/phone within reach;with bed alarm set Nurse Communication: Mobility status PT Visit Diagnosis: Unsteadiness on feet (R26.81);Other abnormalities of gait and mobility (R26.89);Muscle weakness (generalized) (M62.81);Difficulty in walking, not elsewhere classified (R26.2)    Time: 0940-1006 PT Time Calculation (min) (ACUTE ONLY): 26 min   Charges:   PT Evaluation $PT Eval Moderate Complexity: 1 Mod PT Treatments $Therapeutic Exercise: 8-22 mins        Shanekqua Schaper B. Migdalia Dk PT, DPT Acute Rehabilitation Services Pager [phone] Office [phone]   [place] 11/15/2018, 2:37 PM

## 2018-11-15 NOTE — Procedures (Signed)
Patient Name: Mike Briggs  MRN: [id number]  Epilepsy Attending: Lora Havens  Referring Physician/Provider: Dr Heath Lark Date: 11/15/2018 Duration: 20 mins  Patient history: 85yo COVID + male with ams. EEG to evaluate for seizure  Level of alertness: awake  AEDs during EEG study: None  Technical aspects: This EEG study was done with scalp electrodes positioned according to the 10-20 International system of electrode placement. Electrical activity was acquired at a sampling rate of 500Hz  and reviewed with a high frequency filter of 70Hz  and a low frequency filter of 1Hz . EEG data were recorded continuously and digitally stored.   DESCRIPTION:  The posterior dominant rhythm consists of 7 Hz activity of moderate voltage (25-35 uV) seen predominantly in posterior head regions, symmetric and reactive to eye opening and eye closing. There is also continuous generalized 3-6hz  theta-delta slowing.          Hyperventilation and photic stimulation were not performed.  ABNORMALITY -Continuous slow, generalized  IMPRESSION: This study is suggestive of mild diffuse encephalopathy, non specific to etiology. No seizures or epileptiform discharges were seen throughout the recording.

## 2018-11-15 NOTE — Progress Notes (Signed)
EEG Completed; Results Pending  

## 2018-11-15 NOTE — Progress Notes (Signed)
Spoke with nurse; COVID-19 positive pt awake and oriented to self, advised neuro consult is needed to determine whether EEG is still warranted.

## 2018-11-15 NOTE — Progress Notes (Signed)
ANTICOAGULATION CONSULT NOTE - Follow-Up Consult  Pharmacy Consult for Heparin Indication: atrial fibrillation  No Known Allergies  Patient Measurements: Height: 5\' 11"  (180.3 cm) Weight: 199 lb 15.3 oz (90.7 kg) IBW/kg (Calculated) : 75.3 HEPARIN DW (KG): 90.7  Vital Signs: Temp: 97.9 F (36.6 C) (10/17 1622) Temp Source: Axillary (10/17 1622) BP: 114/63 (10/17 1622) Pulse Rate: 51 (10/17 1622)  Labs: Recent Labs    11/13/18 1845 11/14/18 2046 11/15/18 0911 11/15/18 1955  HGB 9.9*  --  9.8*  --   HCT 32.5*  --  31.1*  --   PLT 300  --  318  --   APTT 29  --   --   --   LABPROT 14.2  --   --   --   INR 1.1  --   --   --   HEPARINUNFRC  --  0.68 0.47 0.26*  CREATININE 1.49*  --  0.98  --     Estimated Creatinine Clearance: 60.1 mL/min (by C-G formula based on SCr of 0.98 mg/dL).   Medical History: Past Medical History:  Diagnosis Date  . Arthritis   . Cataract   . Chronic kidney disease   . COVID-19   . Depression   . Diabetes mellitus   . Hyperlipidemia   . Hypertension   . Rhabdomyolysis 11/02/2018  . Stroke [facility])     Assessment: 85 y.o.male,with history of CKD stage III, essential hypertension, wheelchair-bound, was just discharged from hospital after treatment for COVID-19 infection on 11/11/2018.  Patient also with new onset afib and pharmacy asked to start heparin. Not on any oral anticoagulants PTA. With possible stroke, will not bolus with heparin.  Heparin level is below goal, will increase rate and recheck with morning labs.  Goal of Therapy:  Heparin level 0.3-0.5 units/ml Monitor platelets by anticoagulation protocol: Yes   Plan:  -Increase heparin to 950 units/hr -Daily heparin level and CBC   Arrie Senate, PharmD, BCPS Clinical Pharmacist [phone] Please check AMION for all [facility] numbers 11/15/2018

## 2018-11-15 NOTE — Plan of Care (Signed)
  Problem: Clinical Measurements: Goal: Diagnostic test results will improve Outcome: Progressing   

## 2018-11-15 NOTE — Progress Notes (Signed)
Progress Note  Patient Name: Mike Briggs Date of Encounter: 11/15/2018  Chart reviewed per request of Dr. Doristine Bosworth due to patient's recent diagnosis of atrial fibrillation.  Mike Briggs is an elderly gentleman that is wheelchair-bound, history of CKD stage III and hypertension who has been recently treated for COVID-19 infection.  He is presently admitted to the hospital having been found unconscious with possible left-sided facial droop.  On assessment in the ER his mental status improved and head CT was negative for obvious acute event.  Brain MRI was also unrevealing in terms of acute event.  He was seen by neurology with recommendation for EEG, working diagnoses of TIA and encephalopathy.  He was felt to in atrial fibrillation by recent tracings.  Echocardiogram performed on October 16 revealed LVEF 60 to 65% with normal RV contraction, and normal left atrial chamber size.  Tracing from October 15 showed what looks to be sinus rhythm with second-degree type I heart block.  Previous tracings show prolonged PR interval consistent with conduction system disease.  On my review of current telemetry and additional tracings he does not look to be in atrial fibrillation, rather sinus rhythm with second-degree type I heart block.  Present medications are outlined below.  Inpatient Medications    Scheduled Meds:   stroke: mapping our early stages of recovery book   Does not apply Once   amLODipine  10 mg Oral Daily   aspirin  81 mg Oral Daily   atorvastatin  10 mg Oral QPM   colchicine  0.6 mg Oral q morning - 10a   insulin aspart  0-9 Units Subcutaneous TID AC & HS   memantine  10 mg Oral BID   metoprolol succinate  25 mg Oral q morning - 10a   tamsulosin  0.4 mg Oral QPC breakfast   Continuous Infusions:  sodium chloride 75 mL/hr at 11/15/18 0232   heparin 900 Units/hr (11/15/18 1259)   PRN Meds: acetaminophen **OR** acetaminophen (TYLENOL) oral liquid 160 mg/5 mL **OR**  acetaminophen, senna-docusate    Labs    Chemistry Recent Labs  Lab 11/09/18 0145 11/13/18 1845 11/15/18 0911  NA  --  139 141  K  --  3.5 3.8  CL  --  103 108  CO2  --  24 24  GLUCOSE  --  213* 160*  BUN  --  28* 15  CREATININE 1.25* 1.49* 0.98  CALCIUM  --  8.6* 8.3*  PROT  --  6.7  --   ALBUMIN  --  2.5*  --   AST  --  26  --   ALT  --  24  --   ALKPHOS  --  84  --   BILITOT  --  0.4  --   GFRNONAA 51* 41* >60  GFRAA 59* 48* >60  ANIONGAP  --  12 9     Hematology Recent Labs  Lab 11/13/18 1845 11/15/18 0911  WBC 8.0 6.5  RBC 3.32* 3.24*  HGB 9.9* 9.8*  HCT 32.5* 31.1*  MCV 97.9 96.0  MCH 29.8 30.2  MCHC 30.5 31.5  RDW 11.9 11.8  PLT 300 318    Radiology    Mr Brain Wo Contrast  Result Date: 11/14/2018 CLINICAL DATA:  Altered level of consciousness (LOC), unexplained. Additional history provided: COVID + patient, history of stroke, nonverbal, left-sided facial droop upon arrival, patient found unresponsive yesterday EXAM: MRI HEAD WITHOUT CONTRAST TECHNIQUE: Multiplanar, multiecho pulse sequences of the brain and surrounding structures  were obtained without intravenous contrast. COMPARISON:  Head CT 11/13/2018, brain MRI 10/22/2012 FINDINGS: Brain: Multiple sequences are motion degraded, limiting evaluation. There is no convincing evidence of acute infarct. No evidence of intracranial mass. No midline shift or extra-axial fluid collection. No chronic intracranial blood products. Moderate generalized parenchymal atrophy with advanced chronic small vessel ischemic disease, similar to prior MRI 10/22/2012. Vascular: Flow voids maintained within the proximal large arterial vessels. Skull and upper cervical spine: No focal calvarial marrow lesion. T1 hypointense marrow signal within the partially visualized C3 vertebra may be degenerative. Sinuses/Orbits: Bilateral lens replacements. Mild scattered paranasal sinus mucosal thickening. Small bilateral sphenoid sinus  air-fluid levels. No significant mastoid effusion. IMPRESSION: 1. Motion degraded examination. 2. No evidence of acute intracranial abnormality. 3. Generalized parenchymal atrophy and chronic small vessel ischemic disease, similar to prior MRI 10/22/2012. 4. Paranasal sinus disease with small bilateral sphenoid sinus air-fluid levels. Correlate for acute sinusitis. Electronically Signed   By: Kellie Simmering   On: 11/14/2018 08:59   Dg Chest Portable 1 View  Result Date: 11/13/2018 CLINICAL DATA:  Altered mental status.  COVID-19 positive. EXAM: PORTABLE CHEST 1 VIEW COMPARISON:  11/02/2018 FINDINGS: Stable mild cardiomegaly. Mildly increased interstitial opacities throughout both lungs compared to prior. No focal consolidation. Possible trace right pleural effusion. No pneumothorax. IMPRESSION: Diffuse bilateral interstitial opacities, new from prior, which could reflect edema versus an atypical/viral infection. Electronically Signed   By: Davina Poke M.D.   On: 11/13/2018 21:02   Ct Head Code Stroke Wo Contrast  Addendum Date: 11/13/2018   ADDENDUM REPORT: 11/13/2018 19:51 ADDENDUM: Study discussed by telephone with Dr. Nat Christen on 11/13/2018 at [place] hours. Electronically Signed   By: Genevie Ann M.D.   On: 11/13/2018 19:51   Result Date: 11/13/2018 CLINICAL DATA:  Code stroke. 85 year old COVID-19 positive male was unresponsive with left facial droop. EXAM: CT HEAD WITHOUT CONTRAST TECHNIQUE: Contiguous axial images were obtained from the base of the skull through the vertex without intravenous contrast. COMPARISON:  Head CT 02/25/2018. Brain MRI 10/22/2012. FINDINGS: Brain: Stable cerebral volume since January. No midline shift, mass effect, or evidence of intracranial mass lesion. No ventriculomegaly. No acute intracranial hemorrhage identified. Patchy and confluent bilateral cerebral white matter hypodensity appears stable along with similar hypodensity suggested in the pons (series 2, image 7).  No cortically based acute infarct identified. Vascular: Calcified atherosclerosis at the skull base. No suspicious intracranial vascular hyperdensity. Skull: No acute osseous abnormality identified. Sinuses/Orbits: Minor paranasal sinus mucosal thickening. Tympanic cavities and mastoids remain clear. Other: No acute orbit or scalp soft tissue findings. ASPECTS [facility] Stroke Program Early CT Score) Total score (0-10 with 10 being normal): 10 IMPRESSION: 1. Stable non contrast CT appearance of the brain since January with chronic small vessel disease. 2.  ASPECTS 10. Electronically Signed: By: Genevie Ann M.D. On: 11/13/2018 19:15    Cardiac Studies   Echocardiogram 11/14/2018:  1. Limited study.  2. Left ventricular ejection fraction, by visual estimation, is 60 to 65%. The left ventricle has normal function. Normal left ventricular size. There is moderately increased left ventricular hypertrophy.  3. Left ventricular diastolic Doppler parameters are indeterminate pattern of LV diastolic filling.  4. Global right ventricle has normal systolic function.The right ventricular size is normal. No increase in right ventricular wall thickness.  5. Left atrial size was normal.  6. Right atrial size was normal.  7. Presence of pericardial fat pad.  8. Trivial pericardial effusion is present.  9. The pericardial  effusion is posterior to the left ventricle. 10. The mitral valve is grossly normal. No evidence of mitral valve regurgitation. 11. The tricuspid valve is grossly normal. Tricuspid valve regurgitation is trivial. 12. The aortic valve is tricuspid Aortic valve regurgitation was not visualized by color flow Doppler. 13. The pulmonic valve was grossly normal. Pulmonic valve regurgitation is not visualized by color flow Doppler. 14. TR signal is inadequate for assessing pulmonary artery systolic pressure. 15. The interatrial septum was not well visualized.   Assessment & Plan    I reviewed the  patient's recent tracings and telemetry.  He does not look to be in atrial fibrillation, rather sinus rhythm with a prolonged PR interval at baseline and intermittent second-degree type I heart block which makes his rhythm irregular.  Cannot exclude intermittent episodes of second-degree type II heart block.  For now he is hemodynamically stable and currently on Toprol-XL 25 mg daily.  Would suggest continuing on with neurological work-up and treatment for COVID-19.  Would follow-up telemetry and recheck ECG in a.m.  No clear indication as yet for conversion to an oral anticoagulant agent.  Signed, Rozann Lesches, MD  11/15/2018, 1:21 PM

## 2018-11-15 NOTE — Progress Notes (Addendum)
PROGRESS NOTE    Mike Briggs  [id number] DOB: 12-25-1928 DOA: 11/13/2018 PCP: Rosita Fire, MD   Brief Narrative:  [name] a85 y.o.male,with history of CKD stage III, essential hypertension, wheelchair-bound,who was just discharged from hospital on 11/11/2018 after treatment for COVID-19 infection.  received a call that patient was unconscious and unresponsive.  EMS was called on 11/14/2018 andon arrival of EMS patient was unconscious but will respond to painful stimuli. They also noticed left-sided facial droop and initiated code stroke. In the ED patient mental status improved, he was evaluated by tele neurology and stroke work-up was recommended. Initial CT head was negative. Patient not found to be candidate for TPA.  He was tested positive for COVID-19 once again. Patient was admitted for TIA versus CVA with brain MRI not demonstrating any findings of CVA at this time.  He did have a brief period of unconsciousness and discussed case with neurology Dr. Merlene Briggs who recommended EEG.  2D echocardiogram also performed which did not show any acute abnormality.    Patient was also found to be in new onset atrial fibrillation and he was started on heparin drip however he was rate controlled.  He was subsequently transferred to [facility] for EEG.   Assessment & Plan:   Active Problems:   TIA (transient ischemic attack)   Acute encephalopathy  Acute encephalopathy, source unknown/episode of unconsciousness/left facial droop: CT and MRI of the brain are negative for any acute stroke.  He does not have any focal deficit, any weakness on the left facial droop.  We will proceed with EEG.  He is completely alert and oriented currently.  New onset atrial fibrillation: Rate controlled around 60s.  Continues to be on heparin drip.  I have consulted cardiology.  Defer management to them.  Recent COVID-19 infection and treatment: He is not hypoxic.  Does not have any symptoms  regarding COVID-19.  He received all the treatments during recent hospitalization.  Continue to monitor.  Essential hypertension: His antihypertensives were held to allow permissive hypertension for possible stroke but now that he has a negative MRI for any acute stroke, I will resume his medications.  His blood pressure was elevated up until yesterday but this morning it is much better controlled.  I will resume his amlodipine and beta-blocker but hold lisinopril for now.  Type 2 diabetes mellitus: Hold metformin.  Blood sugar control.  Continue SSI.  DVT prophylaxis: Heparin drip Code Status: Full code Family Communication:  None present at bedside.  Plan of care discussed with patient in length and he verbalized understanding and agreed with it. Disposition Plan: TBD.  Potential discharge tomorrow.  Estimated body mass index is 27.89 kg/m as calculated from the following:   Height as of this encounter: 5\' 11"  (1.803 m).   Weight as of this encounter: 90.7 kg.  Pressure Injury 11/08/18 Coccyx Stage III -  Full thickness tissue loss. Subcutaneous fat may be visible but bone, tendon or muscle are NOT exposed. open area with white tissues  (Active)  11/08/18 0900  Location: Coccyx  Location Orientation:   Staging: Stage III -  Full thickness tissue loss. Subcutaneous fat may be visible but bone, tendon or muscle are NOT exposed.  Wound Description (Comments): open area with white tissues   Present on Admission: No     Nutritional status:               Consultants:   Cardiology  Procedures:   None  Antimicrobials:  None   Subjective: Patient seen and examined.  He has no complaints.  Objective: Vitals:   11/15/18 0422 11/15/18 0425 11/15/18 0816 11/15/18 0849  BP:   140/75   Pulse:   82   Resp: (!) 22 19 18    Temp:    97.8 F (36.6 C)  TempSrc:    Oral  SpO2:   97%   Weight:      Height:        Intake/Output Summary (Last 24 hours) at 11/15/2018  1205 Last data filed at 11/15/2018 0400 Gross per 24 hour  Intake 276.17 ml  Output 250 ml  Net 26.17 ml   Filed Weights   11/13/18 1846  Weight: 90.7 kg    Examination:  General exam: Appears calm and comfortable  Respiratory system: Clear to auscultation. Respiratory effort normal. Cardiovascular system: S1 & S2 heard, irregularly irregular rate and rhythm. No JVD, murmurs, rubs, gallops or clicks.  Trace pitting edema bilateral lower extremity Gastrointestinal system: Abdomen is nondistended, soft and nontender. No organomegaly or masses felt. Normal bowel sounds heard. Central nervous system: Alert and oriented. No focal neurological deficits. Extremities: Symmetric 5 x 5 power. Skin: No rashes, lesions or ulcers Psychiatry: Judgement and insight appear poor. Mood & affect appropriate.    Data Reviewed: I have personally reviewed following labs and imaging studies  CBC: Recent Labs  Lab 11/13/18 1845 11/15/18 0911  WBC 8.0 6.5  NEUTROABS 5.1  --   HGB 9.9* 9.8*  HCT 32.5* 31.1*  MCV 97.9 96.0  PLT 300 0000000   Basic Metabolic Panel: Recent Labs  Lab 11/09/18 0145 11/13/18 1845 11/15/18 0911  NA  --  139 141  K  --  3.5 3.8  CL  --  103 108  CO2  --  24 24  GLUCOSE  --  213* 160*  BUN  --  28* 15  CREATININE 1.25* 1.49* 0.98  CALCIUM  --  8.6* 8.3*   GFR: Estimated Creatinine Clearance: 60.1 mL/min (by C-G formula based on SCr of 0.98 mg/dL). Liver Function Tests: Recent Labs  Lab 11/13/18 1845  AST 26  ALT 24  ALKPHOS 84  BILITOT 0.4  PROT 6.7  ALBUMIN 2.5*   No results for input(s): LIPASE, AMYLASE in the last 168 hours. No results for input(s): AMMONIA in the last 168 hours. Coagulation Profile: Recent Labs  Lab 11/13/18 1845  INR 1.1   Cardiac Enzymes: No results for input(s): CKTOTAL, CKMB, CKMBINDEX, TROPONINI in the last 168 hours. BNP (last 3 results) No results for input(s): PROBNP in the last 8760 hours. HbA1C: Recent Labs     11/13/18 1845  HGBA1C 8.6*   CBG: Recent Labs  Lab 11/14/18 1150 11/14/18 1833 11/14/18 1945 11/15/18 0847 11/15/18 1119  GLUCAP 194* 216* 244* 142* 144*   Lipid Profile: Recent Labs    11/14/18 0621  CHOL 146  HDL 28*  LDLCALC 94  TRIG 120  CHOLHDL 5.2   Thyroid Function Tests: Recent Labs    11/14/18 0623  TSH 0.568   Anemia Panel: No results for input(s): VITAMINB12, FOLATE, FERRITIN, TIBC, IRON, RETICCTPCT in the last 72 hours. Sepsis Labs: No results for input(s): PROCALCITON, LATICACIDVEN in the last 168 hours.  Recent Results (from the past 240 hour(s))  SARS Coronavirus 2 by RT PCR (hospital order, performed in [facility] hospital lab) Nasopharyngeal Nasopharyngeal Swab     Status: Abnormal   Collection Time: 11/13/18 10:09 PM   Specimen: Nasopharyngeal Swab  Result Value Ref Range Status   SARS Coronavirus 2 POSITIVE (A) NEGATIVE Final    Comment: RESULT CALLED TO, READ BACK BY AND VERIFIED WITH: J APPELT,RN @0027  11/14/18 MKELLY (NOTE) If result is NEGATIVE SARS-CoV-2 target nucleic acids are NOT DETECTED. The SARS-CoV-2 RNA is generally detectable in upper and lower  respiratory specimens during the acute phase of infection. The lowest  concentration of SARS-CoV-2 viral copies this assay can detect is 250  copies / mL. A negative result does not preclude SARS-CoV-2 infection  and should not be used as the sole basis for treatment or other  patient management decisions.  A negative result may occur with  improper specimen collection / handling, submission of specimen other  than nasopharyngeal swab, presence of viral mutation(s) within the  areas targeted by this assay, and inadequate number of viral copies  (<250 copies / mL). A negative result must be combined with clinical  observations, patient history, and epidemiological information. If result is POSITIVE SARS-CoV-2 target nucleic acids are DETECTED. The  SARS-CoV-2 RNA is generally  detectable in upper and lower  respiratory specimens during the acute phase of infection.  Positive  results are indicative of active infection with SARS-CoV-2.  Clinical  correlation with patient history and other diagnostic information is  necessary to determine patient infection status.  Positive results do  not rule out bacterial infection or co-infection with other viruses. If result is PRESUMPTIVE POSTIVE SARS-CoV-2 nucleic acids MAY BE PRESENT.   A presumptive positive result was obtained on the submitted specimen  and confirmed on repeat testing.  While 2019 novel coronavirus  (SARS-CoV-2) nucleic acids may be present in the submitted sample  additional confirmatory testing may be necessary for epidemiological  and / or clinical management purposes  to differentiate between  SARS-CoV-2 and other Sarbecovirus currently known to infect humans.  If clinically indicated additional testing with an alternate test  methodology [phone]) is a dvised. The SARS-CoV-2 RNA is generally  detectable in upper and lower respiratory specimens during the acute  phase of infection. The expected result is Negative. Fact Sheet for Patients:  StrictlyIdeas.no Fact Sheet for Healthcare Providers: BankingDealers.co.za This test is not yet approved or cleared by the [place] FDA and has been authorized for detection and/or diagnosis of SARS-CoV-2 by FDA under an Emergency Use Authorization (EUA).  This EUA will remain in effect (meaning this test can be used) for the duration of the COVID-19 declaration under Section 564(b)(1) of the Act, 21 U.S.C. section 360bbb-3(b)(1), unless the authorization is terminated or revoked sooner. Performed at [facility], [address] [place], [place] [address]       Radiology Studies: Mr Brain Wo Contrast  Result Date: 11/14/2018 CLINICAL DATA:  Altered level of consciousness (LOC), unexplained. Additional  history provided: COVID + patient, history of stroke, nonverbal, left-sided facial droop upon arrival, patient found unresponsive yesterday EXAM: MRI HEAD WITHOUT CONTRAST TECHNIQUE: Multiplanar, multiecho pulse sequences of the brain and surrounding structures were obtained without intravenous contrast. COMPARISON:  Head CT 11/13/2018, brain MRI 10/22/2012 FINDINGS: Brain: Multiple sequences are motion degraded, limiting evaluation. There is no convincing evidence of acute infarct. No evidence of intracranial mass. No midline shift or extra-axial fluid collection. No chronic intracranial blood products. Moderate generalized parenchymal atrophy with advanced chronic small vessel ischemic disease, similar to prior MRI 10/22/2012. Vascular: Flow voids maintained within the proximal large arterial vessels. Skull and upper cervical spine: No focal calvarial marrow lesion. T1 hypointense marrow signal within the  partially visualized C3 vertebra may be degenerative. Sinuses/Orbits: Bilateral lens replacements. Mild scattered paranasal sinus mucosal thickening. Small bilateral sphenoid sinus air-fluid levels. No significant mastoid effusion. IMPRESSION: 1. Motion degraded examination. 2. No evidence of acute intracranial abnormality. 3. Generalized parenchymal atrophy and chronic small vessel ischemic disease, similar to prior MRI 10/22/2012. 4. Paranasal sinus disease with small bilateral sphenoid sinus air-fluid levels. Correlate for acute sinusitis. Electronically Signed   By: Kellie Simmering   On: 11/14/2018 08:59   Dg Chest Portable 1 View  Result Date: 11/13/2018 CLINICAL DATA:  Altered mental status.  COVID-19 positive. EXAM: PORTABLE CHEST 1 VIEW COMPARISON:  11/02/2018 FINDINGS: Stable mild cardiomegaly. Mildly increased interstitial opacities throughout both lungs compared to prior. No focal consolidation. Possible trace right pleural effusion. No pneumothorax. IMPRESSION: Diffuse bilateral interstitial  opacities, new from prior, which could reflect edema versus an atypical/viral infection. Electronically Signed   By: Davina Poke M.D.   On: 11/13/2018 21:02   Ct Head Code Stroke Wo Contrast  Addendum Date: 11/13/2018   ADDENDUM REPORT: 11/13/2018 19:51 ADDENDUM: Study discussed by telephone with Dr. Nat Christen on 11/13/2018 at [place] hours. Electronically Signed   By: Genevie Ann M.D.   On: 11/13/2018 19:51   Result Date: 11/13/2018 CLINICAL DATA:  Code stroke. 85 year old COVID-19 positive male was unresponsive with left facial droop. EXAM: CT HEAD WITHOUT CONTRAST TECHNIQUE: Contiguous axial images were obtained from the base of the skull through the vertex without intravenous contrast. COMPARISON:  Head CT 02/25/2018. Brain MRI 10/22/2012. FINDINGS: Brain: Stable cerebral volume since January. No midline shift, mass effect, or evidence of intracranial mass lesion. No ventriculomegaly. No acute intracranial hemorrhage identified. Patchy and confluent bilateral cerebral white matter hypodensity appears stable along with similar hypodensity suggested in the pons (series 2, image 7). No cortically based acute infarct identified. Vascular: Calcified atherosclerosis at the skull base. No suspicious intracranial vascular hyperdensity. Skull: No acute osseous abnormality identified. Sinuses/Orbits: Minor paranasal sinus mucosal thickening. Tympanic cavities and mastoids remain clear. Other: No acute orbit or scalp soft tissue findings. ASPECTS [facility] Stroke Program Early CT Score) Total score (0-10 with 10 being normal): 10 IMPRESSION: 1. Stable non contrast CT appearance of the brain since January with chronic small vessel disease. 2.  ASPECTS 10. Electronically Signed: By: Genevie Ann M.D. On: 11/13/2018 19:15    Scheduled Meds:   stroke: mapping our early stages of recovery book   Does not apply Once   aspirin  81 mg Oral Daily   atorvastatin  10 mg Oral QPM   colchicine  0.6 mg Oral q morning - 10a     insulin aspart  0-9 Units Subcutaneous TID AC & HS   memantine  10 mg Oral BID   tamsulosin  0.4 mg Oral QPC breakfast   Continuous Infusions:  sodium chloride 75 mL/hr at 11/15/18 0232   heparin 1,000 Units/hr (11/15/18 0830)     LOS: 1 day   Time spent: 35 minutes   Darliss Cheney, MD [facility]  11/15/2018, 12:05 PM   To contact the attending provider between 7A-7P or the covering provider during after hours 7P-7A, please log into the web site [email or web address] and use password [email or web address].

## 2018-11-16 DIAGNOSIS — I441 Atrioventricular block, second degree: Secondary | ICD-10-CM | POA: Diagnosis not present

## 2018-11-16 LAB — COMPREHENSIVE METABOLIC PANEL
ALT: 20 U/L (ref 0–44)
AST: 26 U/L (ref 15–41)
Albumin: 2 g/dL — ABNORMAL LOW (ref 3.5–5.0)
Alkaline Phosphatase: 75 U/L (ref 38–126)
Anion gap: 10 (ref 5–15)
BUN: 14 mg/dL (ref 8–23)
CO2: 19 mmol/L — ABNORMAL LOW (ref 22–32)
Calcium: 8 mg/dL — ABNORMAL LOW (ref 8.9–10.3)
Chloride: 111 mmol/L (ref 98–111)
Creatinine, Ser: 1.2 mg/dL (ref 0.61–1.24)
GFR calc Af Amer: >60 mL/min (ref >60)
GFR calc non Af Amer: 54 mL/min — ABNORMAL LOW (ref >60)
Glucose, Bld: 126 mg/dL — ABNORMAL HIGH (ref 70–99)
Potassium: 3.5 mmol/L (ref 3.5–5.1)
Sodium: 140 mmol/L (ref 135–145)
Total Bilirubin: 0.4 mg/dL (ref 0.3–1.2)
Total Protein: 5.5 g/dL — ABNORMAL LOW (ref 6.5–8.1)

## 2018-11-16 LAB — CBC
HCT: 29.2 % — ABNORMAL LOW (ref 39.0–52.0)
Hemoglobin: 9.5 g/dL — ABNORMAL LOW (ref 13.0–17.0)
MCH: 30.8 pg (ref 26.0–34.0)
MCHC: 32.5 g/dL (ref 30.0–36.0)
MCV: 94.8 fL (ref 80.0–100.0)
Platelets: 300 10*3/uL (ref 150–400)
RBC: 3.08 MIL/uL — ABNORMAL LOW (ref 4.22–5.81)
RDW: 12 % (ref 11.5–15.5)
WBC: 6.1 10*3/uL (ref 4.0–10.5)
nRBC: 0 % (ref 0.0–0.2)

## 2018-11-16 LAB — MAGNESIUM: Magnesium: 1.3 mg/dL — ABNORMAL LOW (ref 1.7–2.4)

## 2018-11-16 LAB — GLUCOSE, CAPILLARY
Glucose-Capillary: 109 mg/dL — ABNORMAL HIGH (ref 70–99)
Glucose-Capillary: 115 mg/dL — ABNORMAL HIGH (ref 70–99)
Glucose-Capillary: 129 mg/dL — ABNORMAL HIGH (ref 70–99)
Glucose-Capillary: 156 mg/dL — ABNORMAL HIGH (ref 70–99)
Glucose-Capillary: 178 mg/dL — ABNORMAL HIGH (ref 70–99)

## 2018-11-16 LAB — HEPARIN LEVEL (UNFRACTIONATED): Heparin Unfractionated: 0.24 IU/mL — ABNORMAL LOW (ref 0.30–0.70)

## 2018-11-16 MED ORDER — ATORVASTATIN CALCIUM 10 MG PO TABS
20.0000 mg | ORAL_TABLET | Freq: Every evening | ORAL | Status: DC
  Administered 2018-11-16 – 2018-11-17 (×2): 20 mg via ORAL
  Filled 2018-11-16 (×2): qty 2

## 2018-11-16 MED ORDER — CLOPIDOGREL BISULFATE 75 MG PO TABS
75.0000 mg | ORAL_TABLET | Freq: Every day | ORAL | Status: DC
  Administered 2018-11-16 – 2018-11-18 (×3): 75 mg via ORAL
  Filled 2018-11-16 (×3): qty 1

## 2018-11-16 NOTE — Progress Notes (Addendum)
PROGRESS NOTE    Mike Briggs  [id number] DOB: May 04, 1928 DOA: 11/13/2018 PCP: Rosita Fire, MD   Brief Narrative:  [name] a89 y.o.male,with history of CKD stage III, essential hypertension, wheelchair-bound,who was just discharged from hospital on 11/11/2018 after treatment for COVID-19 infection.  received a call that patient was unconscious and unresponsive.  EMS was called on 11/14/2018 andon arrival of EMS patient was unconscious but will respond to painful stimuli. They also noticed left-sided facial droop and initiated code stroke. In the ED patient mental status improved, he was evaluated by tele neurology and stroke work-up was recommended. Initial CT head was negative. Patient not found to be candidate for TPA.  He was tested positive for COVID-19 once again. Patient was admitted for TIA versus CVA with brain MRI not demonstrating any findings of CVA at this time.  He did have a brief period of unconsciousness and discussed case with neurology Dr. Merlene Laughter who recommended EEG.  2D echocardiogram also performed which did not show any acute abnormality.    Patient was also found to be in new onset atrial fibrillation and he was started on heparin drip however he was rate controlled.  He was subsequently transferred to [facility] for EEG.   Subjective:  The patient was seen and examined this morning, stable reporting of no further episodes of headaches visual changes, confusion of progression of weakness in his extremities.  Patient reports that he has a history of stroke with mild right-sided weakness.. This a.m. he denies of having any chest pain or shortness of breath. He continues to complain of generalized weaknesses, stating he usually ambulates with some assist, lives alone at home.    Assessment & Plan:   Active Problems:   TIA (transient ischemic attack)   Acute encephalopathy   Mobitz type 1 second degree atrioventricular block  Acute  encephalopathy, CVA ruled out/possible TIA -CVA ruled out -Unknown etiology, currently improved, back to baseline -Questionable episode of unconsciousness with left-sided droop which has improved - CT and MRI of the brain are negative for any acute stroke. -EEG completed, reporting mild diffuse encephalopathy findings, nonspecific etiology, no seizure or epileptic discharges were identified  New onset atrial fibrillation: -Cardiology following, repeated EKGs and rhythms were reviewed patient remained to be in normal sinus rhythm  EKGs consistent with second-degree block type I,  Rate controlled around 60s.   -Discontinuing heparin --added Plavix to his current dose of aspirin to be continued for minimum 3 weeks, then monotherapy -Appreciate cardiology follow-up and input    Recent COVID-19 infection and treatment:  -He remained stable, currently in no respiratory distress, only persistent cough He is not hypoxic.  -Continue supportive therapy He received all the treatments during recent hospitalization.  Continue to monitor.  Essential hypertension: -Remained stable  His antihypertensives were held to allow permissive hypertension for possible stroke but now that he has a negative MRI for any acute stroke, I will resume his medications.  His blood pressure was elevated up until yesterday but this morning it is much better controlled.  I will resume his amlodipine and beta-blocker but hold lisinopril for now.  Type 2 diabetes mellitus: -Continue to hold metformin, CBG QA CHS, SSI   Severe debility, generalized weaknesses -Patient living alone, -PT/OT consulted appreciate recommendations   DVT prophylaxis: Heparin drip Code Status: Full code Family Communication:  None present at bedside.  Plan of care discussed with patient in length and he verbalized understanding and agreed with it. Disposition Plan:  Pending  PT/OT evaluation -Anticipating to be discharged in a.m. home with  home health and PT/OT recommendations   Estimated body mass index is 27.89 kg/m as calculated from the following:   Height as of this encounter: 5\' 11"  (1.803 m).   Weight as of this encounter: 90.7 kg.  Pressure Injury 11/08/18 Coccyx Stage III -  Full thickness tissue loss. Subcutaneous fat may be visible but bone, tendon or muscle are NOT exposed. open area with white tissues  (Active)  11/08/18 0900  Location: Coccyx  Location Orientation:   Staging: Stage III -  Full thickness tissue loss. Subcutaneous fat may be visible but bone, tendon or muscle are NOT exposed.  Wound Description (Comments): open area with white tissues   Present on Admission: No   Consultants:   Cardiology  Procedures:   None  Antimicrobials:   None   Subjective: Patient seen and examined.  He has no complaints.  Objective: Vitals:   11/15/18 1622 11/15/18 2000 11/15/18 2300 11/16/18 0833  BP: 114/63 (!) 134/56 (!) 127/46 128/63  Pulse: (!) 51 80 83 86  Resp: 18   19  Temp: 97.9 F (36.6 C) 98 F (36.7 C) 98.5 F (36.9 C) 99.2 F (37.3 C)  TempSrc: Axillary Oral Oral Oral  SpO2: 98% 99% 98% 100%  Weight:      Height:        Intake/Output Summary (Last 24 hours) at 11/16/2018 1421 Last data filed at 11/16/2018 0955 Gross per 24 hour  Intake 1195 ml  Output 600 ml  Net 595 ml   Filed Weights   11/13/18 1846  Weight: 90.7 kg    Examination: BP 128/63   Pulse 86   Temp 99.2 F (37.3 C) (Oral)   Resp 19   Ht 5\' 11"  (1.803 m)   Wt 90.7 kg   SpO2 100%   BMI 27.89 kg/m    Physical Exam  Constitution:  Alert, cooperative, no distress,  Psychiatric: Normal and stable mood and affect, cognition intact,   HEENT: Normocephalic, PERRL, otherwise with in Normal limits  Chest:Chest symmetric Cardio vascular:  S1/S2, RRR, No murmure, No Rubs or Gallops  pulmonary: Clear to auscultation bilaterally, respirations unlabored, negative wheezes / crackles Abdomen: Soft, non-tender,  non-distended, bowel sounds,no masses, no organomegaly Muscular skeletal: Limited exam - in bed, able to move all 4 extremities, Normal strength,  Difficulty with gait, right-sided weakness, lower extremity generalized weaknesses. Neuro: CNII-XII intact. , normal motor and sensation, reflexes intact, mild right upper and lower extremity weakness Extremities: No pitting edema lower extremities, +2 pulses  Skin: Dry, warm to touch, negative for any Rashes, No open wounds Wounds: per nursing documentation    Data Reviewed: I have personally reviewed following labs and imaging studies  CBC: Recent Labs  Lab 11/13/18 1845 11/15/18 0911 11/16/18 0728  WBC 8.0 6.5 6.1  NEUTROABS 5.1  --   --   HGB 9.9* 9.8* 9.5*  HCT 32.5* 31.1* 29.2*  MCV 97.9 96.0 94.8  PLT 300 318 [id number]   Basic Metabolic Panel: Recent Labs  Lab 11/13/18 1845 11/15/18 0911 11/16/18 0728  NA 139 141 140  K 3.5 3.8 3.5  CL 103 108 111  CO2 24 24 19*  GLUCOSE 213* 160* 126*  BUN 28* 15 14  CREATININE 1.49* 0.98 1.20  CALCIUM 8.6* 8.3* 8.0*  MG  --   --  1.3*   GFR: Estimated Creatinine Clearance: 49.1 mL/min (by C-G formula based on SCr of 1.2 mg/dL). Liver  Function Tests: Recent Labs  Lab 11/13/18 1845 11/16/18 0728  AST 26 26  ALT 24 20  ALKPHOS 84 75  BILITOT 0.4 0.4  PROT 6.7 5.5*  ALBUMIN 2.5* 2.0*   No results for input(s): LIPASE, AMYLASE in the last 168 hours. No results for input(s): AMMONIA in the last 168 hours. Coagulation Profile: Recent Labs  Lab 11/13/18 1845  INR 1.1   Cardiac Enzymes: No results for input(s): CKTOTAL, CKMB, CKMBINDEX, TROPONINI in the last 168 hours. BNP (last 3 results) No results for input(s): PROBNP in the last 8760 hours. HbA1C: Recent Labs    11/13/18 1845  HGBA1C 8.6*   CBG: Recent Labs  Lab 11/15/18 1618 11/15/18 2112 11/16/18 0004 11/16/18 0432 11/16/18 0828  GLUCAP 184* 234* 115* 109* 129*   Lipid Profile: Recent Labs    11/14/18  0621  CHOL 146  HDL 28*  LDLCALC 94  TRIG 120  CHOLHDL 5.2   Thyroid Function Tests: Recent Labs    11/14/18 0623  TSH 0.568   Anemia Panel: No results for input(s): VITAMINB12, FOLATE, FERRITIN, TIBC, IRON, RETICCTPCT in the last 72 hours. Sepsis Labs: No results for input(s): PROCALCITON, LATICACIDVEN in the last 168 hours.  Recent Results (from the past 240 hour(s))  SARS Coronavirus 2 by RT PCR (hospital order, performed in [facility] hospital lab) Nasopharyngeal Nasopharyngeal Swab     Status: Abnormal   Collection Time: 11/13/18 10:09 PM   Specimen: Nasopharyngeal Swab  Result Value Ref Range Status   SARS Coronavirus 2 POSITIVE (A) NEGATIVE Final    Comment: RESULT CALLED TO, READ BACK BY AND VERIFIED WITH: J APPELT,RN @0027  11/14/18 MKELLY (NOTE) If result is NEGATIVE SARS-CoV-2 target nucleic acids are NOT DETECTED. The SARS-CoV-2 RNA is generally detectable in upper and lower  respiratory specimens during the acute phase of infection. The lowest  concentration of SARS-CoV-2 viral copies this assay can detect is 250  copies / mL. A negative result does not preclude SARS-CoV-2 infection  and should not be used as the sole basis for treatment or other  patient management decisions.  A negative result may occur with  improper specimen collection / handling, submission of specimen other  than nasopharyngeal swab, presence of viral mutation(s) within the  areas targeted by this assay, and inadequate number of viral copies  (<250 copies / mL). A negative result must be combined with clinical  observations, patient history, and epidemiological information. If result is POSITIVE SARS-CoV-2 target nucleic acids are DETECTED. The  SARS-CoV-2 RNA is generally detectable in upper and lower  respiratory specimens during the acute phase of infection.  Positive  results are indicative of active infection with SARS-CoV-2.  Clinical  correlation with patient history and other  diagnostic information is  necessary to determine patient infection status.  Positive results do  not rule out bacterial infection or co-infection with other viruses. If result is PRESUMPTIVE POSTIVE SARS-CoV-2 nucleic acids MAY BE PRESENT.   A presumptive positive result was obtained on the submitted specimen  and confirmed on repeat testing.  While 2019 novel coronavirus  (SARS-CoV-2) nucleic acids may be present in the submitted sample  additional confirmatory testing may be necessary for epidemiological  and / or clinical management purposes  to differentiate between  SARS-CoV-2 and other Sarbecovirus currently known to infect humans.  If clinically indicated additional testing with an alternate test  methodology [phone]) is a dvised. The SARS-CoV-2 RNA is generally  detectable in upper and lower respiratory specimens during  the acute  phase of infection. The expected result is Negative. Fact Sheet for Patients:  StrictlyIdeas.no Fact Sheet for Healthcare Providers: BankingDealers.co.za This test is not yet approved or cleared by the [place] FDA and has been authorized for detection and/or diagnosis of SARS-CoV-2 by FDA under an Emergency Use Authorization (EUA).  This EUA will remain in effect (meaning this test can be used) for the duration of the COVID-19 declaration under Section 564(b)(1) of the Act, 21 U.S.C. section 360bbb-3(b)(1), unless the authorization is terminated or revoked sooner. Performed at [facility], [address] [place], [place] [address]       Radiology Studies: No results found.  Scheduled Meds: .  stroke: mapping our early stages of recovery book   Does not apply Once  . amLODipine  10 mg Oral Daily  . aspirin  81 mg Oral Daily  . atorvastatin  20 mg Oral QPM  . clopidogrel  75 mg Oral Daily  . colchicine  0.6 mg Oral q morning - 10a  . insulin aspart  0-9 Units Subcutaneous TID AC & HS  .  memantine  10 mg Oral BID  . metoprolol succinate  25 mg Oral q morning - 10a  . tamsulosin  0.4 mg Oral QPC breakfast   Continuous Infusions: . sodium chloride 75 mL/hr at 11/15/18 1644     LOS: 2 days   Time spent: 35 minutes   Deatra James, MD [facility]  11/16/2018, 2:21 PM   To contact the attending provider between 7A-7P or the covering provider during after hours 7P-7A, please log into the web site [email or web address] and use password [email or web address].

## 2018-11-16 NOTE — Progress Notes (Signed)
EKG CRITICAL VALUE     12 lead EKG performed.  Critical value noted.  Erenest Rasher, RN notified.   Genia Plants, CCT 11/16/2018 9:49 AM

## 2018-11-16 NOTE — Progress Notes (Signed)
Critical ekg result received, cardiology on call service notified, attending notified

## 2018-11-16 NOTE — Progress Notes (Signed)
   Progress Note  Patient Name: Mike Briggs Date of Encounter: 11/16/2018  Telemetry reviewed.  Patient is in sinus rhythm with prolonged PR interval.  Rare dropped beats noted.  There are no pauses.  No clearly documented atrial fibrillation.  I reviewed the ECG from today which shows sinus rhythm with second-degree type I heart block, similar to the previous tracings.  Would simply observe at this point.  Signed, Rozann Lesches, MD  11/16/2018, 10:01 AM

## 2018-11-16 NOTE — Evaluation (Signed)
Occupational Therapy Evaluation Patient Details Name: Mike Briggs MRN: [id number] DOB: [birth year] Today's Date: 11/16/2018    History of Present Illness 85 y/o male  w/ hx of CKD III, HTN, HLD, vascular dementia, BPH, R LB pain, CVA, sciatica, w/c-bound, living at independent apartment with aid. admitted on 11/02/2018 with concerns for UTI after presenting with fevers, gen weakness, fatigue, nonproductive cough, dysuria.  Covid-19 positive on 11/03/2018, transfered to [facility] but was asymptomatic.Pt discharged on 11/11/2018. EMS called on 11/14/18 and on arrival pt unconscious and unresponsive. CT and MRI negative for stroke. Admitted 11/14/18 for r/o stroke and new onset of Afib.    Clinical Impression   This 85 y/o male presents with the above. PTA pt living alone, reports has an aide who comes daily to assist with ADL/iADL tasks, pt transferring to motor w/c. Pt presenting with weakness, decreased mobility status, and impaired cognition (likely baseline). Pt tolerated sitting EOB >15 min during this session while completing self-feeding, simple grooming ADL tasks. Pt requiring mod-maxA for bed mobility, suspect will require +2 assist for additional mobility beyond EOB; currently requires maxA for LB ADL. He will benefit from continued acute OT services and recommend follow up therapy services in SNF setting after discharge to maximize his safety and independence with ADL and mobility. Will follow.     Follow Up Recommendations  SNF;Supervision/Assistance - 24 hour    Equipment Recommendations  None recommended by OT           Precautions / Restrictions Precautions Precautions: Fall Restrictions Weight Bearing Restrictions: No      Mobility Bed Mobility Overal bed mobility: Needs Assistance Bed Mobility: Supine to Sit;Sit to Supine     Supine to sit: HOB elevated;Mod assist Sit to supine: Mod assist;Max assist   General bed mobility comments: assist for LEs over EOB and  some trunk elevation, assist to scoot hips towards EOB  Transfers                 General transfer comment: unable to attempt decreased assist available    Balance Overall balance assessment: Needs assistance Sitting-balance support: Feet supported;Bilateral upper extremity supported Sitting balance-Leahy Scale: Fair Sitting balance - Comments: supervision for sitting balance EOB                                   ADL either performed or assessed with clinical judgement   ADL Overall ADL's : Needs assistance/impaired Eating/Feeding: Set up;Supervision/ safety;Sitting Eating/Feeding Details (indicate cue type and reason): supervision for sitting balance EOB while eating braekfast Grooming: Set up;Min guard;Sitting   Upper Body Bathing: Minimal assistance;Sitting   Lower Body Bathing: Maximal assistance;Sitting/lateral leans   Upper Body Dressing : Minimal assistance;Sitting   Lower Body Dressing: Maximal assistance;Sitting/lateral leans               Functional mobility during ADLs: Moderate assistance;Maximal assistance(bed mobility only this session) General ADL Comments: pt with weakness, decreased mobility status, cognitive impairments     Pertinent Vitals/Pain Pain Assessment: No/denies pain     Hand Dominance     Extremity/Trunk Assessment Upper Extremity Assessment Upper Extremity Assessment: Generalized weakness       Cervical / Trunk Assessment Cervical / Trunk Assessment: Kyphotic   Communication Communication Communication: HOH   Cognition Arousal/Alertness: Awake/alert Behavior During Therapy: WFL for tasks assessed/performed Overall Cognitive Status: No family/caregiver present to determine baseline cognitive functioning  General Comments: cognitive impariments at baseline; pt oriented to person, place and time (year only); increased time for processing and problem solving    General Comments       Exercises     Shoulder Instructions      Home Living Family/patient expects to be discharged to:: Private residence Living Arrangements: Alone;Other (Comment)(aide comes daily) Available Help at Discharge: Family;Personal care attendant;Available PRN/intermittently Type of Home: Apartment Home Access: Level entry     Home Layout: One level     Bathroom Shower/Tub: Teacher, early years/pre: Handicapped height Bathroom Accessibility: Yes   Home Equipment: Environmental consultant - 2 wheels;Bedside commode;Shower seat;Grab bars - toilet;Grab bars - tub/shower;Electric scooter;Hospital bed(t[place] on bed)          Prior Functioning/Environment Level of Independence: Needs assistance  Gait / Transfers Assistance Needed: primarily uses scooter; ableto transfer self into scooter independently ADL's / Homemaking Assistance Needed: Has HHA 8 hrs/day who assits with bathing and IADL tasks; pt able to take himself to the toilet Communication / Swallowing Assistance Needed: HOH          OT Problem List: Decreased strength;Decreased activity tolerance;Impaired balance (sitting and/or standing);Decreased cognition;Decreased safety awareness;Decreased knowledge of use of DME or AE;Cardiopulmonary status limiting activity;Pain      OT Treatment/Interventions: Self-care/ADL training;Therapeutic exercise;Neuromuscular education;Energy conservation;DME and/or AE instruction;Therapeutic activities;Cognitive remediation/compensation;Patient/family education;Balance training    OT Goals(Current goals can be found in the care plan section) Acute Rehab OT Goals Patient Stated Goal: to get better OT Goal Formulation: With patient Time For Goal Achievement: 11/30/18 Potential to Achieve Goals: Good ADL Goals Pt Will Perform Grooming: with modified independence;sitting Pt Will Perform Lower Body Bathing: with mod assist;sitting/lateral leans;bed level Pt Will Perform Upper  Body Dressing: with supervision;sitting Pt Will Perform Lower Body Dressing: with mod assist;sitting/lateral leans;bed level Pt/caregiver will Perform Home Exercise Program: Increased strength;Increased ROM;Both right and left upper extremity;With Supervision;With written HEP provided Additional ADL Goal #1: Pt will perform bed mobiity with minA as precursor to EOB/OOB ADL.  OT Frequency: Min 2X/week   Barriers to D/C: Decreased caregiver support  pt lives alone, doesn't have 24hr assist       Co-evaluation              AM-PAC OT "6 Clicks" Daily Activity     Outcome Measure Help from another person eating meals?: A Little Help from another person taking care of personal grooming?: A Little Help from another person toileting, which includes using toliet, bedpan, or urinal?: A Lot Help from another person bathing (including washing, rinsing, drying)?: A Lot Help from another person to put on and taking off regular upper body clothing?: A Lot Help from another person to put on and taking off regular lower body clothing?: A Lot 6 Click Score: 14   End of Session Nurse Communication: Mobility status  Activity Tolerance: Patient tolerated treatment well Patient left: in bed;with call bell/phone within reach;with bed alarm set  OT Visit Diagnosis: Other abnormalities of gait and mobility (R26.89);Muscle weakness (generalized) (M62.81);Other symptoms and signs involving cognitive function                Time: [id number] OT Time Calculation (min): 42 min Charges:  OT General Charges $OT Visit: 1 Visit OT Evaluation $OT Eval Moderate Complexity: 1 Mod OT Treatments $Self Care/Home Management : 23-37 mins  Lou Cal, OT [facility] Pager [phone] Office [phone]   Mike Briggs 11/16/2018, 10:39 AM

## 2018-11-17 DIAGNOSIS — G459 Transient cerebral ischemic attack, unspecified: Secondary | ICD-10-CM | POA: Diagnosis not present

## 2018-11-17 DIAGNOSIS — E782 Mixed hyperlipidemia: Secondary | ICD-10-CM

## 2018-11-17 DIAGNOSIS — U071 COVID-19: Secondary | ICD-10-CM

## 2018-11-17 DIAGNOSIS — I10 Essential (primary) hypertension: Secondary | ICD-10-CM | POA: Diagnosis not present

## 2018-11-17 DIAGNOSIS — E1122 Type 2 diabetes mellitus with diabetic chronic kidney disease: Secondary | ICD-10-CM

## 2018-11-17 DIAGNOSIS — G9340 Encephalopathy, unspecified: Secondary | ICD-10-CM | POA: Diagnosis not present

## 2018-11-17 DIAGNOSIS — N183 Chronic kidney disease, stage 3 unspecified: Secondary | ICD-10-CM

## 2018-11-17 DIAGNOSIS — F0150 Vascular dementia without behavioral disturbance: Secondary | ICD-10-CM

## 2018-11-17 LAB — CBC WITH DIFFERENTIAL/PLATELET
Abs Immature Granulocytes: 0.09 10*3/uL — ABNORMAL HIGH (ref 0.00–0.07)
Basophils Absolute: 0.1 10*3/uL (ref 0.0–0.1)
Basophils Relative: 1 %
Eosinophils Absolute: 0.1 10*3/uL (ref 0.0–0.5)
Eosinophils Relative: 2 %
HCT: 29.4 % — ABNORMAL LOW (ref 39.0–52.0)
Hemoglobin: 9.5 g/dL — ABNORMAL LOW (ref 13.0–17.0)
Immature Granulocytes: 2 %
Lymphocytes Relative: 26 %
Lymphs Abs: 1.5 10*3/uL (ref 0.7–4.0)
MCH: 30.5 pg (ref 26.0–34.0)
MCHC: 32.3 g/dL (ref 30.0–36.0)
MCV: 94.5 fL (ref 80.0–100.0)
Monocytes Absolute: 0.7 10*3/uL (ref 0.1–1.0)
Monocytes Relative: 12 %
Neutro Abs: 3.4 10*3/uL (ref 1.7–7.7)
Neutrophils Relative %: 57 %
Platelets: 302 10*3/uL (ref 150–400)
RBC: 3.11 MIL/uL — ABNORMAL LOW (ref 4.22–5.81)
RDW: 11.9 % (ref 11.5–15.5)
WBC: 5.8 10*3/uL (ref 4.0–10.5)
nRBC: 0.3 % — ABNORMAL HIGH (ref 0.0–0.2)

## 2018-11-17 LAB — BASIC METABOLIC PANEL
Anion gap: 8 (ref 5–15)
BUN: 14 mg/dL (ref 8–23)
CO2: 22 mmol/L (ref 22–32)
Calcium: 8 mg/dL — ABNORMAL LOW (ref 8.9–10.3)
Chloride: 107 mmol/L (ref 98–111)
Creatinine, Ser: 1.31 mg/dL — ABNORMAL HIGH (ref 0.61–1.24)
GFR calc Af Amer: 56 mL/min — ABNORMAL LOW (ref >60)
GFR calc non Af Amer: 48 mL/min — ABNORMAL LOW (ref >60)
Glucose, Bld: 207 mg/dL — ABNORMAL HIGH (ref 70–99)
Potassium: 3.5 mmol/L (ref 3.5–5.1)
Sodium: 137 mmol/L (ref 135–145)

## 2018-11-17 LAB — GLUCOSE, CAPILLARY
Glucose-Capillary: 112 mg/dL — ABNORMAL HIGH (ref 70–99)
Glucose-Capillary: 122 mg/dL — ABNORMAL HIGH (ref 70–99)
Glucose-Capillary: 125 mg/dL — ABNORMAL HIGH (ref 70–99)
Glucose-Capillary: 168 mg/dL — ABNORMAL HIGH (ref 70–99)
Glucose-Capillary: 194 mg/dL — ABNORMAL HIGH (ref 70–99)
Glucose-Capillary: 195 mg/dL — ABNORMAL HIGH (ref 70–99)
Glucose-Capillary: 204 mg/dL — ABNORMAL HIGH (ref 70–99)
Glucose-Capillary: 212 mg/dL — ABNORMAL HIGH (ref 70–99)

## 2018-11-17 LAB — MAGNESIUM: Magnesium: 1.2 mg/dL — ABNORMAL LOW (ref 1.7–2.4)

## 2018-11-17 MED ORDER — VITAMIN C 500 MG PO TABS
500.0000 mg | ORAL_TABLET | Freq: Every day | ORAL | Status: DC
  Administered 2018-11-17 – 2018-11-18 (×2): 500 mg via ORAL
  Filled 2018-11-17 (×2): qty 1

## 2018-11-17 MED ORDER — ZINC SULFATE 220 (50 ZN) MG PO CAPS
220.0000 mg | ORAL_CAPSULE | Freq: Every day | ORAL | Status: DC
  Administered 2018-11-17 – 2018-11-18 (×2): 220 mg via ORAL
  Filled 2018-11-17 (×2): qty 1

## 2018-11-17 MED ORDER — POTASSIUM CHLORIDE 20 MEQ PO PACK
40.0000 meq | PACK | Freq: Once | ORAL | Status: AC
  Administered 2018-11-17: 40 meq via ORAL
  Filled 2018-11-17: qty 2

## 2018-11-17 MED ORDER — MAGNESIUM SULFATE 4 GM/100ML IV SOLN
4.0000 g | Freq: Once | INTRAVENOUS | Status: AC
  Administered 2018-11-17: 4 g via INTRAVENOUS
  Filled 2018-11-17: qty 100

## 2018-11-17 NOTE — Progress Notes (Signed)
Physical Therapy Treatment Patient Details Name: Mike Briggs MRN: [id number] DOB: 1928/02/27 Today's Date: 11/17/2018    History of Present Illness 85 y/o male  w/ hx of CKD III, HTN, HLD, vascular dementia, BPH, R LB pain, CVA, sciatica, w/c-bound, living at independent apartment with aid. admitted on 11/02/2018 with concerns for UTI after presenting with fevers, gen weakness, fatigue, nonproductive cough, dysuria.  Covid-19 positive on 11/03/2018, transfered to [facility] but was asymptomatic.Pt discharged on 11/11/2018. EMS called on 11/14/18 and on arrival pt unconscious and unresponsive. CT and MRI negative for stroke. Admitted 11/14/18 for r/o stroke and new onset of Afib.     PT Comments    Patient progressing to OOB transfer this session.  Utilized bed functions to aide him in lifting up to sit and pulled up in bed using headboard.  Still feel safest d/c situation would be SNF, but if pt refusing would recommend HHPT/RN/SW and as close to 24 hour S as possible.  PT to  Continue to follow acutely.   Follow Up Recommendations  SNF(but pt refusing SNF, will need HHPT, RN, SW)     Equipment Recommendations  None recommended by PT    Recommendations for Other Services       Precautions / Restrictions Precautions Precautions: Fall    Mobility  Bed Mobility Overal bed mobility: Needs Assistance Bed Mobility: Supine to Sit;Sit to Supine;Rolling Rolling: Supervision   Supine to sit: Supervision;HOB elevated Sit to supine: Supervision   General bed mobility comments: increased time and pt using bed controls to assist with lifting trunk and taking time to get legs into bed, but no physical help given  Transfers Overall transfer level: Needs assistance   Transfers: Lateral/Scoot Transfers;Squat Pivot Transfers     Squat pivot transfers: Mod assist    Lateral/Scoot Transfers: Mod assist General transfer comment: to drop arm recliner next to bed, initially scooting, then  up on his feet with lifting help then sitting on edge of chair then scooted over, assist to get testicles unfolded when in chair,  then back to bed using bed rail and pulling up to stand with lifting help then sitting on edge of bed and scooting over then bringing legs onto bed lifting from seat on chair  Ambulation/Gait                 Stairs             Wheelchair Mobility    Modified Rankin (Stroke Patients Only)       Balance Overall balance assessment: Needs assistance Sitting-balance support: Feet supported Sitting balance-Leahy Scale: Good   Postural control: Posterior lean Standing balance support: Bilateral upper extremity supported Standing balance-Leahy Scale: Poor Standing balance comment: standing briefly with holding rail and assist on other arm for balance with posterior bias                            Cognition Arousal/Alertness: Awake/alert Behavior During Therapy: WFL for tasks assessed/performed Overall Cognitive Status: No family/caregiver present to determine baseline cognitive functioning                                 General Comments: cognitive impariments at baseline; pt oriented to person, place and time (year only); increased time for processing and problem solving      Exercises      General Comments General comments (  skin integrity, edema, etc.): knows where he is and described his home set up and the intermittent aide help he gets throughout the day      Pertinent Vitals/Pain Faces Pain Scale: Hurts even more Pain Location: back; BLE Pain Descriptors / Indicators: Aching;Discomfort Pain Intervention(s): Monitored during session;Repositioned    Home Living                      Prior Function            PT Goals (current goals can now be found in the care plan section) Progress towards PT goals: Progressing toward goals    Frequency           PT Plan Current plan remains  appropriate    Co-evaluation              AM-PAC PT "6 Clicks" Mobility   Outcome Measure  Help needed turning from your back to your side while in a flat bed without using bedrails?: A Little Help needed moving from lying on your back to sitting on the side of a flat bed without using bedrails?: A Lot Help needed moving to and from a bed to a chair (including a wheelchair)?: A Lot Help needed standing up from a chair using your arms (e.g., wheelchair or bedside chair)?: A Lot Help needed to walk in hospital room?: Total Help needed climbing 3-5 steps with a railing? : Total 6 Click Score: 11    End of Session   Activity Tolerance: Patient tolerated treatment well Patient left: in bed;with call bell/phone within reach Nurse Communication: Mobility status;Other (comment)(needs new condom cath) PT Visit Diagnosis: Unsteadiness on feet (R26.81);Other abnormalities of gait and mobility (R26.89);Muscle weakness (generalized) (M62.81);Difficulty in walking, not elsewhere classified (R26.2)     Time: [id number] PT Time Calculation (min) (ACUTE ONLY): 27 min  Charges:  $Therapeutic Activity: 23-37 mins                     Magda Kiel, [place] [facility] [phone] 11/17/2018    Reginia Naas 11/17/2018, 4:54 PM

## 2018-11-17 NOTE — TOC Transition Note (Addendum)
Transition of Care [facility]) - CM/SW Discharge Note   Patient Details  Name: Mike Briggs MRN: [id number] Date of Birth: 01/26/1929  Transition of Care [facility]) CM/SW Contact:  Geralynn Ochs, LCSW Phone Number: 11/17/2018, 3:53 PM   Clinical Narrative:   CSW called patient's niece, Stanton Kidney, multiple times throughout the day today trying to discuss the patient's discharge home today and left messages for [facility] to call back. CSW received call from RN this afternoon asking about discharge home, and CSW discussed need to communicate with Mary/HCPOA about patient's discharge home first. Stanton Kidney called RN, and RN told Stanton Kidney to call CSW.  CSW spoke with Stanton Kidney, who indicated that she had called to the hospital this morning and told someone her work number to call for any needs today. CSW never received work number. Stanton Kidney says they've been waiting for patient to be at his apartment all day, her daughter went over this morning to pick it up and Lysol everything. CSW discussed with Stanton Kidney the recommendation for SNF, and patient and family continue to refuse. CSW discussed need for 24/7 supervision, and Stanton Kidney says they are already providing that after the patient went home last. Stanton Kidney says she knows that he's not safe to be on his own right now, so they have aide support during the day and family will stay with him at night. Patient will need PTAR home.   CSW alerted Alvis Lemmings that patient is discharging home today, orders are in place.   ADDENDUM: CSW alerted by MD that patient is not discharging today, medical workup ongoing. CSW informed home health company that patient not discharging home today.  Final next level of care: [place] Barriers to Discharge: No Barriers Identified   Patient Goals and CMS Choice Patient states their goals for this hospitalization and ongoing recovery are:: to go home CMS Medicare.gov Compare Post Acute Care list provided to:: Patient Represenative (must  comment) Choice offered to / list presented to : [facility] POA / Guardian  Discharge Placement                Patient to be transferred to facility by: Gregory Name of family member notified: Mary Patient and family notified of of transfer: 11/17/18  Discharge Plan and Services                          HH Arranged: PT, OT, Nurse's Aide, Social Work, RN [place] Agency: [place] Date [place]: 11/17/18 Time [place]: [id number] Representative spoke with at [place]: [place] ([place]) Interventions     Readmission Risk Interventions No flowsheet data found.

## 2018-11-17 NOTE — Progress Notes (Addendum)
PROGRESS NOTE    Mike Briggs  [id number] DOB: 06/14/1928 DOA: 11/13/2018 PCP: Mike Fire, MD   Brief Narrative:  [name] a89 y.o.male,with history of CKD stage III, essential hypertension, wheelchair-bound,who was just discharged from hospital on 11/11/2018 after treatment for COVID-19 infection.  received a call that patient was unconscious and unresponsive.  EMS was called on 11/14/2018 andon arrival of EMS patient was unconscious but will respond to painful stimuli. They also noticed left-sided facial droop and initiated code stroke. In the ED patient mental status improved, he was evaluated by tele neurology and stroke work-up was recommended. Initial CT head was negative. Patient not found to be candidate for TPA.  He was tested positive for COVID-19 once again. Patient was admitted for TIA versus CVA with brain MRI not demonstrating any findings of CVA at this time. He did have a brief period of unconsciousness and discussed case with neurology Dr. Merlene Briggs who recommended EEG. 2D echocardiogram also performed which did not show any acute abnormality.   Patient was also found to be in new onset atrial fibrillation and he was started on heparin drip however he was rate controlled.  He was subsequently transferred to [facility] for EEG.     Assessment & Plan:   Principal Problem:   Acute encephalopathy Active Problems:   TIA (transient ischemic attack)   Type 2 diabetes mellitus with diabetic chronic kidney disease (HCC)   Chronic kidney disease, stage III (moderate) (HCC)   Vascular dementia (HCC)   Essential hypertension   HLD (hyperlipidemia)   COVID-19 virus infection   Mobitz type 1 second degree atrioventricular block   1 acute encephalopathy/CVA ruled out/probable TIA Patient had presented with unresponsiveness per initial HPI with some associated left facial weakness/droop and stroke work-up done with head CT which was negative an MRI of  the head which was negative for any acute abnormalities.  EEG which was done showed mild diffuse encephalopathy, nonspecific to etiology.  No seizures were pulled up discharges seen throughout the recording.  Patient improved clinically and likely close to baseline.  2D echo which was done did not show any source of emboli.  Fasting lipid panel with LDL of 94.  Patient currently on aspirin and Plavix.  Continue statin.  Patient seen by cardiology and is felt patient does not have atrial fibrillation.  Spoke with neurology, Dr. Erlinda Briggs who recommended CT angiogram of head and neck to evaluate vessels and will see patient in consultation.  PT/OT/ST.  Patient wanting to be discharged home with home health orders which have been ordered.  Per social work family able to provide 24/7 supervision due to concerns for safety.  Appreciate neurology input and recommendations.  Will likely need outpatient follow-up with neurology.  2.  Type I second-degree AV block On admission there was concern for possible atrial fibrillation.  Cardiology was consulted and per cardiology patient likely with intermittent type I second-degree AV block.  Patient noted to be hemodynamically stable.  Per cardiology no evidence of atrial fibrillation.  Continue current cardiac medications of Norvasc, Toprol-XL allergy input and recommendations.  3.  Recent COVID-19 infection and treatment Patient currently stable.  Patient is not hypoxic.  Patient noted to have received all these treatments during recent hospitalization.  Continue supportive care.  Place on vitamin C and zinc.  Outpatient follow-up.  4.  Hypertension Stable.  Patient's antihypertensives were initially held to allow permissive hypertension for possible stroke however on admission patient noted not to be significantly hypertensive.  MRI head negative for any acute stroke.  Blood pressure currently stable.  Continue home regimen of Norvasc and Toprol-XL.  Follow.  5.  Type 2  diabetes mellitus Hemoglobin A1c 8.6 on 11/13/2018.  CBG 112 this morning.  Continue sliding scale insulin.  Oral hypoglycemic agents.  6.  Hyperlipidemia Continue statin.  7.  Hypomagnesemia Magnesium sulfate 4 g IV x1.  Repeat magnesium levels in the morning.  8.  Vascular dementia Continue Namenda.  9.  Chronic kidney disease stage III Stable.  Patient on gentle hydration.  Follow.  10.  History of prior CVA Patient noted to have some residual right-sided weakness.  Patient now on aspirin and Plavix.  Continue statin.  Will need outpatient follow-up.  DVT prophylaxis: Aspirin/Plavix. heparin discontinued Code Status: Full Family Communication: No family at bedside. Disposition Plan: Likely home with home health therapies if patient able to get 24-hour supervision due to safety.  Patient refusing SNF.   Consultants:   Telemetry neuro hospitalist Dr. Libby Briggs 11/13/2018  Speech therapy for cognitive evaluation 11/17/2018  Cardiology  Procedures:   Chest x-ray 11/13/2018  MRI brain 11/14/2018  2D echo 11/14/2018  CT head 11/13/2018  EEG 11/15/2018  Antimicrobials:   None   Subjective: Patient alert.  Denies any chest pain.  Denies any shortness of breath.  Feels he is close to his baseline.  Does not want to go to a SNF and wants to go home.  Per RN concern about patient safety home going home as patient noted to be in independent living facility.  Objective: Vitals:   11/16/18 0833 11/16/18 1712 11/16/18 2300 11/17/18 0848  BP: 128/63 133/67 (!) 151/68 (!) 148/64  Pulse: 86 84 91 91  Resp: 19 17  19   Temp: 99.2 F (37.3 C) 99.6 F (37.6 C) 98 F (36.7 C) 98.8 F (37.1 C)  TempSrc: Oral Oral Oral Oral  SpO2: 100% 95% 97% 97%  Weight:      Height:        Intake/Output Summary (Last 24 hours) at 11/17/2018 1626 Last data filed at 11/17/2018 1000 Gross per 24 hour  Intake 3574.83 ml  Output 600 ml  Net 2974.83 ml   Filed Weights   11/13/18  1846  Weight: 90.7 kg    Examination:  General exam: Appears calm and comfortable  Respiratory system: Clear to auscultation anterior lung fields. Respiratory effort normal. Cardiovascular system: S1 & S2 heard, RRR. No JVD, murmurs, rubs, gallops or clicks. No pedal edema. Gastrointestinal system: Abdomen is nondistended, soft and nontender. No organomegaly or masses felt. Normal bowel sounds heard. Central nervous system: Alert. No focal neurological deficits. Extremities: Symmetric 5 x 5 power. Skin: No rashes, lesions or ulcers Psychiatry: Judgement and insight appear normal. Mood & affect appropriate.     Data Reviewed: I have personally reviewed following labs and imaging studies  CBC: Recent Labs  Lab 11/13/18 1845 11/15/18 0911 11/16/18 0728 11/17/18 1131  WBC 8.0 6.5 6.1 5.8  NEUTROABS 5.1  --   --  3.4  HGB 9.9* 9.8* 9.5* 9.5*  HCT 32.5* 31.1* 29.2* 29.4*  MCV 97.9 96.0 94.8 94.5  PLT 300 318 300 [id number]   Basic Metabolic Panel: Recent Labs  Lab 11/13/18 1845 11/15/18 0911 11/16/18 0728 11/17/18 1131  NA 139 141 140 137  K 3.5 3.8 3.5 3.5  CL 103 108 111 107  CO2 24 24 19* 22  GLUCOSE 213* 160* 126* 207*  BUN 28* 15 14 14   CREATININE 1.49* 0.98  1.20 1.31*  CALCIUM 8.6* 8.3* 8.0* 8.0*  MG  --   --  1.3* 1.2*   GFR: Estimated Creatinine Clearance: 44.9 mL/min (A) (by C-G formula based on SCr of 1.31 mg/dL (H)). Liver Function Tests: Recent Labs  Lab 11/13/18 1845 11/16/18 0728  AST 26 26  ALT 24 20  ALKPHOS 84 75  BILITOT 0.4 0.4  PROT 6.7 5.5*  ALBUMIN 2.5* 2.0*   No results for input(s): LIPASE, AMYLASE in the last 168 hours. No results for input(s): AMMONIA in the last 168 hours. Coagulation Profile: Recent Labs  Lab 11/13/18 1845  INR 1.1   Cardiac Enzymes: No results for input(s): CKTOTAL, CKMB, CKMBINDEX, TROPONINI in the last 168 hours. BNP (last 3 results) No results for input(s): PROBNP in the last 8760 hours. HbA1C: No results  for input(s): HGBA1C in the last 72 hours. CBG: Recent Labs  Lab 11/17/18 0004 11/17/18 0404 11/17/18 0622 11/17/18 0823 11/17/18 1244  GLUCAP 168* 125* 122* 112* 212*   Lipid Profile: No results for input(s): CHOL, HDL, LDLCALC, TRIG, CHOLHDL, LDLDIRECT in the last 72 hours. Thyroid Function Tests: No results for input(s): TSH, T4TOTAL, FREET4, T3FREE, THYROIDAB in the last 72 hours. Anemia Panel: No results for input(s): VITAMINB12, FOLATE, FERRITIN, TIBC, IRON, RETICCTPCT in the last 72 hours. Sepsis Labs: No results for input(s): PROCALCITON, LATICACIDVEN in the last 168 hours.  Recent Results (from the past 240 hour(s))  SARS Coronavirus 2 by RT PCR (hospital order, performed in [facility] hospital lab) Nasopharyngeal Nasopharyngeal Swab     Status: Abnormal   Collection Time: 11/13/18 10:09 PM   Specimen: Nasopharyngeal Swab  Result Value Ref Range Status   SARS Coronavirus 2 POSITIVE (A) NEGATIVE Final    Comment: RESULT CALLED TO, READ BACK BY AND VERIFIED WITH: J APPELT,RN @0027  11/14/18 MKELLY (NOTE) If result is NEGATIVE SARS-CoV-2 target nucleic acids are NOT DETECTED. The SARS-CoV-2 RNA is generally detectable in upper and lower  respiratory specimens during the acute phase of infection. The lowest  concentration of SARS-CoV-2 viral copies this assay can detect is 250  copies / mL. A negative result does not preclude SARS-CoV-2 infection  and should not be used as the sole basis for treatment or other  patient management decisions.  A negative result may occur with  improper specimen collection / handling, submission of specimen other  than nasopharyngeal swab, presence of viral mutation(s) within the  areas targeted by this assay, and inadequate number of viral copies  (<250 copies / mL). A negative result must be combined with clinical  observations, patient history, and epidemiological information. If result is POSITIVE SARS-CoV-2 target nucleic acids are  DETECTED. The  SARS-CoV-2 RNA is generally detectable in upper and lower  respiratory specimens during the acute phase of infection.  Positive  results are indicative of active infection with SARS-CoV-2.  Clinical  correlation with patient history and other diagnostic information is  necessary to determine patient infection status.  Positive results do  not rule out bacterial infection or co-infection with other viruses. If result is PRESUMPTIVE POSTIVE SARS-CoV-2 nucleic acids MAY BE PRESENT.   A presumptive positive result was obtained on the submitted specimen  and confirmed on repeat testing.  While 2019 novel coronavirus  (SARS-CoV-2) nucleic acids may be present in the submitted sample  additional confirmatory testing may be necessary for epidemiological  and / or clinical management purposes  to differentiate between  SARS-CoV-2 and other Sarbecovirus currently known to infect humans.  If clinically indicated additional testing with an alternate test  methodology [phone]) is a dvised. The SARS-CoV-2 RNA is generally  detectable in upper and lower respiratory specimens during the acute  phase of infection. The expected result is Negative. Fact Sheet for Patients:  StrictlyIdeas.no Fact Sheet for Healthcare Providers: BankingDealers.co.za This test is not yet approved or cleared by the [place] FDA and has been authorized for detection and/or diagnosis of SARS-CoV-2 by FDA under an Emergency Use Authorization (EUA).  This EUA will remain in effect (meaning this test can be used) for the duration of the COVID-19 declaration under Section 564(b)(1) of the Act, 21 U.S.C. section 360bbb-3(b)(1), unless the authorization is terminated or revoked sooner. Performed at [facility], [address], [place], [place] [address]          Radiology Studies: No results found.      Scheduled Meds: .  stroke: mapping our early  stages of recovery book   Does not apply Once  . amLODipine  10 mg Oral Daily  . aspirin  81 mg Oral Daily  . atorvastatin  20 mg Oral QPM  . clopidogrel  75 mg Oral Daily  . colchicine  0.6 mg Oral q morning - 10a  . insulin aspart  0-9 Units Subcutaneous TID AC & HS  . memantine  10 mg Oral BID  . metoprolol succinate  25 mg Oral q morning - 10a  . tamsulosin  0.4 mg Oral QPC breakfast  . vitamin C  500 mg Oral Daily  . zinc sulfate  220 mg Oral Daily   Continuous Infusions: . sodium chloride 75 mL/hr at 11/17/18 0850  . magnesium sulfate bolus IVPB       LOS: 3 days    Time spent: 35 minutes    Irine Seal, MD [facility]  If 7PM-7AM, please contact night-coverage [email or web address] 11/17/2018, 4:26 PM

## 2018-11-17 NOTE — Plan of Care (Signed)

## 2018-11-17 NOTE — Plan of Care (Signed)
  Problem: Clinical Measurements: Goal: Ability to maintain clinical measurements within normal limits will improve Outcome: Progressing Goal: Diagnostic test results will improve Outcome: Progressing   

## 2018-11-17 NOTE — Evaluation (Signed)
Speech Language Pathology Evaluation Patient Details Name: Mike Briggs MRN: [id number] DOB: [birth year] Today's Date: 11/17/2018 Time: 1200-1230 SLP Time Calculation (min) (ACUTE ONLY): 30 min  Problem List:  Patient Active Problem List   Diagnosis Date Noted  . Mobitz type 1 second degree atrioventricular block   . TIA (transient ischemic attack) 11/14/2018  . Acute encephalopathy 11/14/2018  . Sacral decubitus ulcer, stage III ([place]) 11/11/2018  . Pressure injury of skin 11/08/2018  . COVID-19 virus infection 11/03/2018  . COVID-19 virus detected 11/03/2018  . Gram-positive bacteremia 11/03/2018  . Dehydration 11/02/2018  . Rhabdomyolysis 11/02/2018  . UTI (urinary tract infection) 02/26/2018  . Syncope 02/25/2018  . Vitamin D deficiency 05/31/2016  . Essential hypertension 05/22/2016  . Hemiparesis affecting right side as late effect of cerebrovascular accident (CVA) ([place]) 05/22/2016  . Personal history of gout 05/22/2016  . BPH (benign prostatic hyperplasia) 05/22/2016  . Chronic constipation 05/22/2016  . HLD (hyperlipidemia) 05/22/2016  . Pseudophakia 12/06/2015  . Vascular dementia ([place]) 10/23/2012  . Chronic kidney disease, stage III (moderate) ([place]) 10/20/2012  . Type 2 diabetes mellitus with diabetic chronic kidney disease ([place]) 12/16/2008  . ANKLE, ARTHRITIS, DEGEN./OSTEO 12/16/2008   Past Medical History:  Past Medical History:  Diagnosis Date  . Arthritis   . Cataract   . Chronic kidney disease   . COVID-19   . Depression   . Diabetes mellitus   . Hyperlipidemia   . Hypertension   . Rhabdomyolysis 11/02/2018  . Stroke [facility])    Past Surgical History:  Past Surgical History:  Procedure Laterality Date  . BACK SURGERY     cervical and lumbar  . CATARACT EXTRACTION    . CHOLECYSTECTOMY    . FOOT SURGERY     5th toe   . SPINE SURGERY     lumbar and cervical fusions   HPI:  85yo male admitted 11/13/2018 with AMS. PMH: COVID-19, CKD3, HTN,  depression, HLD, CVA, rhabdomyolosis. MRI = no acute intracranial abnormality.85 y/o male  w/ hx of CKD III, HTN, HLD, vascular dementia, BPH, R LB pain, CVA, sciatica, w/c-bound, living at independent apartment with aid. admitted on 11/02/2018 with concerns for UTI after presenting with fevers, gen weakness, fatigue, nonproductive cough, dysuria.  Covid-19 positive on 11/03/2018, transfered to [facility] but was asymptomatic.Pt discharged on 11/11/2018. EMS called on 11/14/18 and on arrival pt unconscious and unresponsive. CT and MRI negative for stroke. Admitted 11/14/18 for r/o stroke and new onset of Afib.   Assessment / Plan / Recommendation Clinical Impression  The Mini-Mental State Examination (MMSE) was administered. Pt scored 20/30, indicating cognitive impairment (n=24+/30). Points were lost on orientation, attention, immediate recall and delayed recall. Deficits in these areas, in addition to co-morbidities of vascular dementia and previous stroke raise significant concern for pt safety if left alone. 24 hour supervision is recommended at DC to maximize pt safety.    SLP Assessment  SLP Recommendation/Assessment: All further Speech Language Pathology  needs can be addressed in the next venue of care SLP Visit Diagnosis: Cognitive communication deficit (R41.841)    Follow Up Recommendations  24 hour supervision/assistance       SLP Evaluation Cognition  Overall Cognitive Status: No family/caregiver present to determine baseline cognitive functioning Arousal/Alertness: Awake/alert Orientation Level: Oriented to person;Oriented to place;Disoriented to time;Disoriented to situation Attention: Focused;Sustained;Selective Focused Attention: Appears intact Sustained Attention: Appears intact Selective Attention: Impaired Selective Attention Impairment: Verbal basic Memory: Impaired Memory Impairment: Storage deficit;Retrieval deficit;Decreased recall of new information;Decreased short  term  memory Awareness: Impaired Awareness Impairment: Intellectual impairment;Emergent impairment Problem Solving: Impaired Problem Solving Impairment: Verbal basic Safety/Judgment: Impaired       Comprehension  Auditory Comprehension Overall Auditory Comprehension: Appears within functional limits for tasks assessed    Expression Expression Primary Mode of Expression: Verbal Verbal Expression Overall Verbal Expression: Appears within functional limits for tasks assessed   Oral / Motor  Oral Motor/Sensory Function Overall Oral Motor/Sensory Function: Within functional limits Motor Speech Overall Motor Speech: Appears within functional limits for tasks assessed   GO                   Celia B. Quentin Ore, [facility], [place] Speech Language Pathologist Office: [phone] Pager: [phone]  Shonna Chock 11/17/2018, 12:43 PM

## 2018-11-17 NOTE — Progress Notes (Signed)
Pt remains in SR  P waves are small  There is intermittent Type I second degree AV block   No evidence of atrial fibrillation Hemodynamics are stable   Will continue to follow telemetry for arrhythmias     No new recommendations .    Dorris Carnes MD

## 2018-11-17 NOTE — Progress Notes (Signed)
Alert oriented to person and place, vague about date but knew year. Patient reoriented , no other neurological signs noted, able to follow directions. Patient incontinent of urine, bath given and made comfortable. Patient poor candidate for stroke education but reviewed importance of reporting changes to care provider and family, verbalized understanding.

## 2018-11-17 NOTE — Consult Note (Signed)
Stroke Neurology Consultation Note  Consult Requested by: Dr. Grandville Silos  Reason for Consult: Altered mental status with left facial droop  Consult Date: 11/17/18  The history was obtained from the patient and chart.  During history and examination, all items were able to obtain unless otherwise noted.  However, patient is not a good historian and most history from chart and Dr. Grandville Silos.  History of Present Illness:  Mike Briggs is a 85 y.o. African American male with PMH of CKD 3, hypertension, vascular dementia, wheelchair-bound at home, diabetes, hyperlipidemia presented to [facility] for altered mental status.  As per chart, patient was found to be unconscious and unresponsive at home on 11/13/2018.  On EMS arrival, patient able to respond to pain stimuli with left-sided facial droop.  Code stroke initiated, telemetry neurology recommended no TPA due to unclear onset and not a IR candidate due to mRS 4.  Recommend further stroke work-up.  Neurologist Dr. Merlene Laughter recommended EEG.  Patient transferred to [facility] for further evaluation.  EKG also concerning for A. fib, patient was put on heparin IV.  Cardiology consultation do not see any A. fib but type I second-degree AV block.  Heparin IV discontinued, and the patient now on antiplatelet.  EEG mild slowing, no seizure.  MRI no acute abnormality.  Stroke neurology consulted for possible TIA.  As per patient, he cannot remember clearly what what happened on the day of 11/13/2018.  However, he stated that he remembered he was sitting at the side of the bed and then not able to remember after.  He stated that he may feel some dizziness at that time.  He admitted that he frequently has dizziness if he sits up too quick from lying to sitting position.  As per chart, patient was recently admitted on 11/05/2018 nonproductive cough, fatigue and generalized weakness.  Found to have Covid 19+.  Also found to have UTI and treated with IV  Rocephin.  Patient remained good oxygen saturation at room air resolved further call for shortness of breath.  Plan to discharge him to skilled nursing facility however he and his family refused and took him home.  LSN: Time onset unclear on 11/13/2018 tPA Given: No: Outside window  Past Medical History:  Diagnosis Date  . Arthritis   . Cataract   . Chronic kidney disease   . COVID-19   . Depression   . Diabetes mellitus   . Hyperlipidemia   . Hypertension   . Rhabdomyolysis 11/02/2018  . Stroke [facility])     Past Surgical History:  Procedure Laterality Date  . BACK SURGERY     cervical and lumbar  . CATARACT EXTRACTION    . CHOLECYSTECTOMY    . FOOT SURGERY     5th toe   . SPINE SURGERY     lumbar and cervical fusions    Family History  Family history unknown: Yes    Social History:  reports that he quit smoking about 51 years ago. He has never used smokeless tobacco. He reports that he does not drink alcohol or use drugs.  Allergies: No Known Allergies  No current facility-administered medications on file prior to encounter.    Current Outpatient Medications on File Prior to Encounter  Medication Sig Dispense Refill  . atorvastatin (LIPITOR) 10 MG tablet TAKE 1 TABLET BY MOUTH AFTER SUPPER. (Patient taking differently: Take 10 mg by mouth every evening. ) 90 tablet 0  . colchicine 0.6 MG tablet Take 1 tablet (0.6  mg total) by mouth daily. (Patient taking differently: Take 0.6 mg by mouth every morning. ) 30 tablet 0  . lisinopril (PRINIVIL,ZESTRIL) 40 MG tablet TAKE (1) TABLET BY MOUTH ONCE DAILY. (Patient taking differently: Take 40 mg by mouth every morning. ) 90 tablet 0  . memantine (NAMENDA) 10 MG tablet Take 10 mg by mouth 2 (two) times daily.     . metFORMIN (GLUCOPHAGE) 500 MG tablet Take 1 tablet (500 mg total) by mouth 2 (two) times daily with a meal. 60 tablet 11  . metoprolol succinate (TOPROL-XL) 25 MG 24 hr tablet TAKE (1) TABLET BY MOUTH ONCE DAILY.  (Patient taking differently: Take 25 mg by mouth every morning. ) 90 tablet 0  . tamsulosin (FLOMAX) 0.4 MG CAPS capsule Take 1 capsule (0.4 mg total) by mouth daily after breakfast. (Patient taking differently: Take 0.4 mg by mouth 2 (two) times daily. ) 60 capsule 3  . amLODipine (NORVASC) 10 MG tablet Take 1 tablet (10 mg total) by mouth daily. (Patient not taking: Reported on 11/14/2018) 30 tablet 0    Review of Systems: A full ROS was attempted today and was not able to be performed due to vascular dementia.  Physical Examination: Temp:  [97.8 F (36.6 C)-99.6 F (37.6 C)] 97.8 F (36.6 C) (10/19 1629) Pulse Rate:  [84-91] 88 (10/19 1629) Resp:  [17-19] 17 (10/19 1629) BP: (123-151)/(64-72) 123/72 (10/19 1629) SpO2:  [95 %-99 %] 99 % (10/19 1629)  General - well nourished, well developed, in no apparent distress.    Ophthalmologic - fundi not visualized due to noncooperation.    Cardiovascular - regular rhythm and rate  Mental Status -  Level of arousal and orientation to age, time, place, and person were intact. Language including expression, naming, repetition, comprehension was assessed and found intact.  Mild dysarthria  Cranial Nerves II - XII - II - Vision intact OU. III, IV, VI - Extraocular movements intact. V - Facial sensation intact bilaterally. VII - left nasolabial fold flattening. VIII - Hearing & vestibular intact bilaterally. X - Palate elevates symmetrically. XI - Chin turning & shoulder shrug intact bilaterally. XII - Tongue protrusion intact.  Motor Strength - The patient's strength was normal in all extremities and pronator drift was absent.   Motor Tone & Bulk - Muscle tone was assessed at the neck and appendages and was normal.  Bulk was normal and fasciculations were absent.   Reflexes - The patient's reflexes were 4+/5 BUEs and 3/5 BLEs and he had no pathological reflexes.  Sensory - Light touch, temperature/pinprick were assessed and were  normal.    Coordination - The patient had normal movements in the hands with no ataxia or dysmetria.  Tremor was absent.  Gait and Station - deferred  Data Reviewed: Mr Brain Wo Contrast  Result Date: 11/14/2018 CLINICAL DATA:  Altered level of consciousness (LOC), unexplained. Additional history provided: COVID + patient, history of stroke, nonverbal, left-sided facial droop upon arrival, patient found unresponsive yesterday EXAM: MRI HEAD WITHOUT CONTRAST TECHNIQUE: Multiplanar, multiecho pulse sequences of the brain and surrounding structures were obtained without intravenous contrast. COMPARISON:  Head CT 11/13/2018, brain MRI 10/22/2012 FINDINGS: Brain: Multiple sequences are motion degraded, limiting evaluation. There is no convincing evidence of acute infarct. No evidence of intracranial mass. No midline shift or extra-axial fluid collection. No chronic intracranial blood products. Moderate generalized parenchymal atrophy with advanced chronic small vessel ischemic disease, similar to prior MRI 10/22/2012. Vascular: Flow voids maintained within the proximal large  arterial vessels. Skull and upper cervical spine: No focal calvarial marrow lesion. T1 hypointense marrow signal within the partially visualized C3 vertebra may be degenerative. Sinuses/Orbits: Bilateral lens replacements. Mild scattered paranasal sinus mucosal thickening. Small bilateral sphenoid sinus air-fluid levels. No significant mastoid effusion. IMPRESSION: 1. Motion degraded examination. 2. No evidence of acute intracranial abnormality. 3. Generalized parenchymal atrophy and chronic small vessel ischemic disease, similar to prior MRI 10/22/2012. 4. Paranasal sinus disease with small bilateral sphenoid sinus air-fluid levels. Correlate for acute sinusitis. Electronically Signed   By: Kellie Simmering   On: 11/14/2018 08:59   Dg Chest Portable 1 View  Result Date: 11/13/2018 CLINICAL DATA:  Altered mental status.  COVID-19 positive.  EXAM: PORTABLE CHEST 1 VIEW COMPARISON:  11/02/2018 FINDINGS: Stable mild cardiomegaly. Mildly increased interstitial opacities throughout both lungs compared to prior. No focal consolidation. Possible trace right pleural effusion. No pneumothorax. IMPRESSION: Diffuse bilateral interstitial opacities, new from prior, which could reflect edema versus an atypical/viral infection. Electronically Signed   By: Davina Poke M.D.   On: 11/13/2018 21:02   Dg Chest Portable 1 View  Result Date: 11/02/2018 CLINICAL DATA:  Cough, increased weakness EXAM: PORTABLE CHEST 1 VIEW COMPARISON:  02/25/2018, 10/20/2012 FINDINGS: The heart size and mediastinal contours are within normal limits. Underpenetrated AP portable examination without acute appearing abnormality of the lungs. The visualized skeletal structures are unremarkable. IMPRESSION: Underpenetrated AP portable examination without acute appearing abnormality of the lungs. Electronically Signed   By: Eddie Candle M.D.   On: 11/02/2018 13:08   Ct Head Code Stroke Wo Contrast  Addendum Date: 11/13/2018   ADDENDUM REPORT: 11/13/2018 19:51 ADDENDUM: Study discussed by telephone with Dr. Nat Christen on 11/13/2018 at [place] hours. Electronically Signed   By: Genevie Ann M.D.   On: 11/13/2018 19:51   Result Date: 11/13/2018 CLINICAL DATA:  Code stroke. 85 year old COVID-19 positive male was unresponsive with left facial droop. EXAM: CT HEAD WITHOUT CONTRAST TECHNIQUE: Contiguous axial images were obtained from the base of the skull through the vertex without intravenous contrast. COMPARISON:  Head CT 02/25/2018. Brain MRI 10/22/2012. FINDINGS: Brain: Stable cerebral volume since January. No midline shift, mass effect, or evidence of intracranial mass lesion. No ventriculomegaly. No acute intracranial hemorrhage identified. Patchy and confluent bilateral cerebral white matter hypodensity appears stable along with similar hypodensity suggested in the pons (series 2, image  7). No cortically based acute infarct identified. Vascular: Calcified atherosclerosis at the skull base. No suspicious intracranial vascular hyperdensity. Skull: No acute osseous abnormality identified. Sinuses/Orbits: Minor paranasal sinus mucosal thickening. Tympanic cavities and mastoids remain clear. Other: No acute orbit or scalp soft tissue findings. ASPECTS [facility] Stroke Program Early CT Score) Total score (0-10 with 10 being normal): 10 IMPRESSION: 1. Stable non contrast CT appearance of the brain since January with chronic small vessel disease. 2.  ASPECTS 10. Electronically Signed: By: Genevie Ann M.D. On: 11/13/2018 19:15    Assessment: 85 y.o. male with PMH of CKD 3, hypertension, vascular dementia, wheelchair-bound at home, diabetes, hyperlipidemia, recent Covid infection and UTI presented with AMS, decreased level of consciousness and left facial droop.  Patient now apparently back to his baseline, still has mild left facial asymmetry, question for chronic instead of acute finding. MRI no acute abnormality. EEG mild slowing, no seizure.  A1c 8.6 and LDL 94.  UDS negative.  COVID-19 still positive.  EF 60 to 65%.  Etiology for his altered mental status not clear, could be due to syncope as patient felt  some dizziness before loss of consciousness.  Patient does complain of feeling dizzy if sitting up too quick at home.  DDx also including AKI and CKD, deconditioning, or post ictal state.  Recommend continue IV hydration and avoid low BP.  Recommend CTA head and neck to complete stroke work-up.  Recommend 30-day cardiac event monitoring as outpatient to rule out A. fib.  Continue DAPT for 3 weeks and then aspirin alone.  Continue statin.  Stroke Risk Factors - diabetes mellitus, hyperlipidemia and hypertension  Plan: - CTA head and neck to complete stroke work-up - 30-day cardiac event monitoring as outpatient to rule out A. Fib - Continue aspirin 81 and Plavix 75 DAPT for 3 weeks and then  aspirin alone - Continue statin - PT/OT - Risk factor modification - Telemetry monitoring - Frequent neuro checks - will follow  Thank you for this consultation and allowing [place] to participate in the care of this patient.  Rosalin Hawking, MD PhD Stroke Neurology 11/17/2018 5:13 PM

## 2018-11-17 NOTE — Plan of Care (Signed)
  Problem: Clinical Measurements: Goal: Ability to maintain clinical measurements within normal limits will improve 11/17/2018 0624 by Fredirick Maudlin, RN Outcome: Progressing 11/17/2018 0000 by Fredirick Maudlin, RN Outcome: Progressing Goal: Diagnostic test results will improve Outcome: Progressing   Problem: Safety: Goal: Ability to remain free from injury will improve Outcome: Progressing

## 2018-11-18 ENCOUNTER — Inpatient Hospital Stay (HOSPITAL_COMMUNITY): Payer: Medicare Other

## 2018-11-18 ENCOUNTER — Encounter (HOSPITAL_COMMUNITY): Payer: Self-pay | Admitting: Radiology

## 2018-11-18 DIAGNOSIS — N183 Chronic kidney disease, stage 3 unspecified: Secondary | ICD-10-CM | POA: Diagnosis not present

## 2018-11-18 DIAGNOSIS — I441 Atrioventricular block, second degree: Secondary | ICD-10-CM | POA: Diagnosis not present

## 2018-11-18 DIAGNOSIS — G9340 Encephalopathy, unspecified: Secondary | ICD-10-CM | POA: Diagnosis not present

## 2018-11-18 DIAGNOSIS — I10 Essential (primary) hypertension: Secondary | ICD-10-CM | POA: Diagnosis not present

## 2018-11-18 DIAGNOSIS — U071 COVID-19: Secondary | ICD-10-CM | POA: Diagnosis not present

## 2018-11-18 DIAGNOSIS — G459 Transient cerebral ischemic attack, unspecified: Secondary | ICD-10-CM | POA: Diagnosis not present

## 2018-11-18 LAB — BASIC METABOLIC PANEL
Anion gap: 9 (ref 5–15)
BUN: 15 mg/dL (ref 8–23)
CO2: 21 mmol/L — ABNORMAL LOW (ref 22–32)
Calcium: 8 mg/dL — ABNORMAL LOW (ref 8.9–10.3)
Chloride: 108 mmol/L (ref 98–111)
Creatinine, Ser: 1.23 mg/dL (ref 0.61–1.24)
GFR calc Af Amer: >60 mL/min (ref >60)
GFR calc non Af Amer: 52 mL/min — ABNORMAL LOW (ref >60)
Glucose, Bld: 179 mg/dL — ABNORMAL HIGH (ref 70–99)
Potassium: 4.1 mmol/L (ref 3.5–5.1)
Sodium: 138 mmol/L (ref 135–145)

## 2018-11-18 LAB — MAGNESIUM: Magnesium: 1.8 mg/dL (ref 1.7–2.4)

## 2018-11-18 LAB — HEMOGLOBIN AND HEMATOCRIT, BLOOD
HCT: 28.8 % — ABNORMAL LOW (ref 39.0–52.0)
Hemoglobin: 9.3 g/dL — ABNORMAL LOW (ref 13.0–17.0)

## 2018-11-18 LAB — GLUCOSE, CAPILLARY
Glucose-Capillary: 155 mg/dL — ABNORMAL HIGH (ref 70–99)
Glucose-Capillary: 192 mg/dL — ABNORMAL HIGH (ref 70–99)

## 2018-11-18 MED ORDER — HYDROCODONE-HOMATROPINE 5-1.5 MG/5ML PO SYRP: 5.0000 mL | ORAL_SOLUTION | Freq: Four times a day (QID) | ORAL | Status: DC | PRN

## 2018-11-18 MED ORDER — GUAIFENESIN ER 600 MG PO TB12
1200.0000 mg | ORAL_TABLET | Freq: Two times a day (BID) | ORAL | Status: DC
  Administered 2018-11-18: 1200 mg via ORAL
  Filled 2018-11-18: qty 2

## 2018-11-18 MED ORDER — IOHEXOL 350 MG/ML SOLN
75.0000 mL | Freq: Once | INTRAVENOUS | Status: AC | PRN
  Administered 2018-11-18: 75 mL via INTRAVENOUS

## 2018-11-18 MED ORDER — ZINC SULFATE 220 (50 ZN) MG PO CAPS: 220.0000 mg | ORAL_CAPSULE | Freq: Every day | ORAL | Status: DC

## 2018-11-18 MED ORDER — ASPIRIN 81 MG PO CHEW: 81.0000 mg | CHEWABLE_TABLET | Freq: Every day | ORAL | Status: DC

## 2018-11-18 MED ORDER — CLOPIDOGREL BISULFATE 75 MG PO TABS: 75.0000 mg | ORAL_TABLET | Freq: Every day | ORAL | 0 refills | Status: AC

## 2018-11-18 MED ORDER — ASCORBIC ACID 500 MG PO TABS: 500.0000 mg | ORAL_TABLET | Freq: Every day | ORAL | Status: DC

## 2018-11-18 MED ORDER — GUAIFENESIN ER 600 MG PO TB12: 1200.0000 mg | ORAL_TABLET | Freq: Two times a day (BID) | ORAL | 0 refills | Status: AC

## 2018-11-18 MED ORDER — MAGNESIUM SULFATE 2 GM/50ML IV SOLN
2.0000 g | Freq: Once | INTRAVENOUS | Status: AC
  Administered 2018-11-18: 2 g via INTRAVENOUS
  Filled 2018-11-18: qty 50

## 2018-11-18 NOTE — TOC Transition Note (Signed)
Transition of Care [facility]) - CM/SW Discharge Note   Patient Details  Name: Mike Briggs MRN: [id number] Date of Birth: 12-15-1928  Transition of Care [facility]) CM/SW Contact:  Geralynn Ochs, LCSW Phone Number: 11/18/2018, 4:06 PM   Clinical Narrative:   Patient discharging home via [place] CSW notified Tommi Rumps with [facility] to resume home health services, orders already in the chart. Stanton Kidney will go to patient's apartment to wait for him to arrive.     Final next level of care: [place] Barriers to Discharge: Barriers Resolved   Patient Goals and CMS Choice Patient states their goals for this hospitalization and ongoing recovery are:: to go home CMS Medicare.gov Compare Post Acute Care list provided to:: Patient Represenative (must comment) Choice offered to / list presented to : [facility] POA / Guardian  Discharge Placement                Patient to be transferred to facility by: [place] Name of family member notified: Mary Patient and family notified of of transfer: 11/18/18  Discharge Plan and Services                          HH Arranged: PT, OT, Nurse's Aide, Social Work, RN [place] Agency: [place] Date [place]: 11/17/18 Time [place]: 1553 Representative spoke with at [place]: [place] ([place]) Interventions     Readmission Risk Interventions No flowsheet data found.

## 2018-11-18 NOTE — Progress Notes (Signed)
STROKE TEAM PROGRESS NOTE   INTERVAL HISTORY No acute event overnight. Had CTA head and neck showed no LVO or high grade stenosis. Recommend 30 day monitoring. Ok to d/c from neuro standpoint.   Vitals:   11/17/18 1900 11/17/18 2300 11/18/18 0300 11/18/18 0827  BP: 132/78 129/74 133/78 (!) 110/55  Pulse: 74 62 (!) 59 72  Resp: 18   (!) 22  Temp: 97.9 F (36.6 C) 97.7 F (36.5 C)  99.1 F (37.3 C)  TempSrc: Oral Oral  Oral  SpO2: 96% 95% 98% 99%  Weight:      Height:        CBC:  Recent Labs  Lab 11/13/18 1845  11/16/18 0728 11/17/18 1131 11/18/18 0356  WBC 8.0   < > 6.1 5.8  --   NEUTROABS 5.1  --   --  3.4  --   HGB 9.9*   < > 9.5* 9.5* 9.3*  HCT 32.5*   < > 29.2* 29.4* 28.8*  MCV 97.9   < > 94.8 94.5  --   PLT 300   < > 300 302  --    < > = values in this interval not displayed.    Basic Metabolic Panel:  Recent Labs  Lab 11/17/18 1131 11/18/18 0356  NA 137 138  K 3.5 4.1  CL 107 108  CO2 22 21*  GLUCOSE 207* 179*  BUN 14 15  CREATININE 1.31* 1.23  CALCIUM 8.0* 8.0*  MG 1.2* 1.8   Lipid Panel:     Component Value Date/Time   CHOL 146 11/14/2018 0621   TRIG 120 11/14/2018 0621   HDL 28 (L) 11/14/2018 0621   CHOLHDL 5.2 11/14/2018 0621   VLDL 24 11/14/2018 0621   LDLCALC 94 11/14/2018 0621   LDLCALC 92 09/10/2017 1037   HgbA1c:  Lab Results  Component Value Date   HGBA1C 8.6 (H) 11/13/2018   Urine Drug Screen:     Component Value Date/Time   LABOPIA NONE DETECTED 11/13/2018 2357   COCAINSCRNUR NONE DETECTED 11/13/2018 2357   LABBENZ NONE DETECTED 11/13/2018 2357   AMPHETMU NONE DETECTED 11/13/2018 2357   THCU NONE DETECTED 11/13/2018 2357   LABBARB NONE DETECTED 11/13/2018 2357    Alcohol Level     Component Value Date/Time   ETH <10 11/13/2018 1845    IMAGING Ct Angio Head W Or Wo Contrast  Result Date: 11/18/2018 CLINICAL DATA:  Initial evaluation for TIA, initial exam. Altered mental status with left facial droop. EXAM: CT  ANGIOGRAPHY HEAD AND NECK TECHNIQUE: Multidetector CT imaging of the head and neck was performed using the standard protocol during bolus administration of intravenous contrast. Multiplanar CT image reconstructions and MIPs were obtained to evaluate the vascular anatomy. Carotid stenosis measurements (when applicable) are obtained utilizing NASCET criteria, using the distal internal carotid diameter as the denominator. CONTRAST:  48mL OMNIPAQUE IOHEXOL 350 MG/ML SOLN COMPARISON:  Prior head CT from 11/13/2018 as well as previous brain MRI from 11/14/2018. FINDINGS: CT HEAD FINDINGS Brain: Moderately advanced cerebral atrophy with chronic small vessel ischemic disease, stable. No acute intracranial hemorrhage. No acute large vessel territory infarct. No mass lesion, midline shift or mass effect. No hydrocephalus. No extra-axial fluid collection. Vascular: No hyperdense vessel. Scattered vascular calcifications noted within the carotid siphons. Skull: Scalp soft tissues and calvarium within normal limits. Sinuses: Scattered mucosal thickening noted within the ethmoidal air cells, sphenoid sinuses, and maxillary sinuses, with small amount of layering fluid within the sphenoid sinuses bilaterally. Trace  bilateral mastoid effusions. Orbits: Globes and orbital soft tissues demonstrate no acute finding. Review of the MIP images confirms the above findings CTA NECK FINDINGS Aortic arch: Visualized aortic arch of normal caliber with normal 3 vessel morphology. Mild atheromatous plaque within the distal arch. No hemodynamically significant stenosis about the origin of the great vessels. Visualized subclavian arteries widely patent. Right carotid system: Right CCA widely patent from its origin to the bifurcation without stenosis. Mix concentric plaque about the right bifurcation/proximal right ICA with associated stenosis of up to 40% by NASCET criteria. Right ICA otherwise widely patent distally to the skull base without  stenosis, dissection or occlusion. Left carotid system: Left CCA widely patent from its origin to the bifurcation without stenosis. Mild eccentric plaque at the origin of the left ICA without hemodynamically significant stenosis. Left ICA widely patent distally to the skull base without stenosis, dissection, or occlusion. Vertebral arteries: Both vertebral arteries arise from the subclavian arteries. Right vertebral artery dominant. Vertebral arteries widely patent within the neck without stenosis, dissection or occlusion. Skeleton: No acute osseous finding. No discrete lytic or blastic osseous lesions. Prior fusion at C3-C7. Patient is edentulous. Other neck: No other acute soft tissue abnormality within the neck. No mass lesion or adenopathy. Upper chest: Small layering bilateral pleural effusions partially visualized. Associated scattered atelectatic changes. Visualized upper chest demonstrates no other acute finding. Review of the MIP images confirms the above findings CTA HEAD FINDINGS Anterior circulation: Petrous segments widely patent. Scattered atheromatous plaque within the cavernous/supraclinoid ICAs with associated mild to moderate multifocal narrowing. ICA termini well perfused. A1 segments patent bilaterally. Normal anterior communicating artery complex. Anterior cerebral arteries widely patent to their distal aspects without stenosis. No M1 stenosis or occlusion. Normal MCA bifurcations. Distal MCA branches well perfused and symmetric. Posterior circulation: Focal atheromatous plaque within the dominant right V4 segment with short-segment mild stenosis. Right vertebral otherwise widely patent. Left V4 segment widely patent to the vertebrobasilar junction. Patent right PICA. Left PICA not seen. Dominant left AICA. Basilar patent to its distal aspect without flow-limiting stenosis. Superior cerebral arteries patent bilaterally. Both of the posterior cerebral arteries primarily supplied via the basilar  and are well perfused to their distal aspects without stenosis. Venous sinuses: Patent. Anatomic variants: None significant. Review of the MIP images confirms the above findings IMPRESSION: CT HEAD IMPRESSION: 1. Stable head CT.  No acute intracranial abnormality identified. 2. Atrophy with chronic small vessel ischemic disease, unchanged. CTA HEAD AND NECK IMPRESSION: 1. Negative CTA for large vessel occlusion. 2. 40% atheromatous stenosis about the right bifurcation/proximal right ICA. 3. Moderate atherosclerotic change about the carotid siphons without hemodynamically significant stenosis. 4. Otherwise wide patency of the major arterial vasculature of the head and neck. No other hemodynamically significant or correctable stenosis. 5. Small layering bilateral pleural effusions with associated atelectasis, partially visualized. Electronically Signed   By: Jeannine Boga M.D.   On: 11/18/2018 05:06   Ct Angio Neck W Or Wo Contrast  Result Date: 11/18/2018 CLINICAL DATA:  Initial evaluation for TIA, initial exam. Altered mental status with left facial droop. EXAM: CT ANGIOGRAPHY HEAD AND NECK TECHNIQUE: Multidetector CT imaging of the head and neck was performed using the standard protocol during bolus administration of intravenous contrast. Multiplanar CT image reconstructions and MIPs were obtained to evaluate the vascular anatomy. Carotid stenosis measurements (when applicable) are obtained utilizing NASCET criteria, using the distal internal carotid diameter as the denominator. CONTRAST:  47mL OMNIPAQUE IOHEXOL 350 MG/ML SOLN COMPARISON:  Prior  head CT from 11/13/2018 as well as previous brain MRI from 11/14/2018. FINDINGS: CT HEAD FINDINGS Brain: Moderately advanced cerebral atrophy with chronic small vessel ischemic disease, stable. No acute intracranial hemorrhage. No acute large vessel territory infarct. No mass lesion, midline shift or mass effect. No hydrocephalus. No extra-axial fluid  collection. Vascular: No hyperdense vessel. Scattered vascular calcifications noted within the carotid siphons. Skull: Scalp soft tissues and calvarium within normal limits. Sinuses: Scattered mucosal thickening noted within the ethmoidal air cells, sphenoid sinuses, and maxillary sinuses, with small amount of layering fluid within the sphenoid sinuses bilaterally. Trace bilateral mastoid effusions. Orbits: Globes and orbital soft tissues demonstrate no acute finding. Review of the MIP images confirms the above findings CTA NECK FINDINGS Aortic arch: Visualized aortic arch of normal caliber with normal 3 vessel morphology. Mild atheromatous plaque within the distal arch. No hemodynamically significant stenosis about the origin of the great vessels. Visualized subclavian arteries widely patent. Right carotid system: Right CCA widely patent from its origin to the bifurcation without stenosis. Mix concentric plaque about the right bifurcation/proximal right ICA with associated stenosis of up to 40% by NASCET criteria. Right ICA otherwise widely patent distally to the skull base without stenosis, dissection or occlusion. Left carotid system: Left CCA widely patent from its origin to the bifurcation without stenosis. Mild eccentric plaque at the origin of the left ICA without hemodynamically significant stenosis. Left ICA widely patent distally to the skull base without stenosis, dissection, or occlusion. Vertebral arteries: Both vertebral arteries arise from the subclavian arteries. Right vertebral artery dominant. Vertebral arteries widely patent within the neck without stenosis, dissection or occlusion. Skeleton: No acute osseous finding. No discrete lytic or blastic osseous lesions. Prior fusion at C3-C7. Patient is edentulous. Other neck: No other acute soft tissue abnormality within the neck. No mass lesion or adenopathy. Upper chest: Small layering bilateral pleural effusions partially visualized. Associated  scattered atelectatic changes. Visualized upper chest demonstrates no other acute finding. Review of the MIP images confirms the above findings CTA HEAD FINDINGS Anterior circulation: Petrous segments widely patent. Scattered atheromatous plaque within the cavernous/supraclinoid ICAs with associated mild to moderate multifocal narrowing. ICA termini well perfused. A1 segments patent bilaterally. Normal anterior communicating artery complex. Anterior cerebral arteries widely patent to their distal aspects without stenosis. No M1 stenosis or occlusion. Normal MCA bifurcations. Distal MCA branches well perfused and symmetric. Posterior circulation: Focal atheromatous plaque within the dominant right V4 segment with short-segment mild stenosis. Right vertebral otherwise widely patent. Left V4 segment widely patent to the vertebrobasilar junction. Patent right PICA. Left PICA not seen. Dominant left AICA. Basilar patent to its distal aspect without flow-limiting stenosis. Superior cerebral arteries patent bilaterally. Both of the posterior cerebral arteries primarily supplied via the basilar and are well perfused to their distal aspects without stenosis. Venous sinuses: Patent. Anatomic variants: None significant. Review of the MIP images confirms the above findings IMPRESSION: CT HEAD IMPRESSION: 1. Stable head CT.  No acute intracranial abnormality identified. 2. Atrophy with chronic small vessel ischemic disease, unchanged. CTA HEAD AND NECK IMPRESSION: 1. Negative CTA for large vessel occlusion. 2. 40% atheromatous stenosis about the right bifurcation/proximal right ICA. 3. Moderate atherosclerotic change about the carotid siphons without hemodynamically significant stenosis. 4. Otherwise wide patency of the major arterial vasculature of the head and neck. No other hemodynamically significant or correctable stenosis. 5. Small layering bilateral pleural effusions with associated atelectasis, partially visualized.  Electronically Signed   By: Jeannine Boga M.D.   On:  11/18/2018 05:06    PHYSICAL EXAM  Temp:  [97.7 F (36.5 C)-99.1 F (37.3 C)] 99.1 F (37.3 C) (10/20 0827) Pulse Rate:  [59-88] 72 (10/20 0827) Resp:  [17-22] 22 (10/20 0827) BP: (110-133)/(55-78) 110/55 (10/20 0827) SpO2:  [95 %-99 %] 99 % (10/20 0827)  General - well nourished, well developed, in no apparent distress.    Ophthalmologic - fundi not visualized due to noncooperation.    Cardiovascular - regular rhythm and rate  Mental Status -  Level of arousal and orientation to age, time, place, and person were intact. Language including expression, naming, repetition, comprehension was assessed and found intact.  Mild dysarthria  Cranial Nerves II - XII - II - Vision intact OU. III, IV, VI - Extraocular movements intact. V - Facial sensation intact bilaterally. VII - left nasolabial fold flattening. VIII - Hearing & vestibular intact bilaterally. X - Palate elevates symmetrically. XI - Chin turning & shoulder shrug intact bilaterally. XII - Tongue protrusion intact.  Motor Strength - The patient's strength was normal in all extremities and pronator drift was absent.   Motor Tone & Bulk - Muscle tone was assessed at the neck and appendages and was normal.  Bulk was normal and fasciculations were absent.   Reflexes - The patient's reflexes were 4+/5 BUEs and 3/5 BLEs and he had no pathological reflexes.  Sensory - Light touch, temperature/pinprick were assessed and were normal.    Coordination - The patient had normal movements in the hands with no ataxia or dysmetria.  Tremor was absent.  Gait and Station - deferred   ASSESSMENT/PLAN Mr. ESLI LANMAN is a 85 y.o. male with history of CKD 3, hypertension, vascular dementia, wheelchair-bound at home, diabetes, hyperlipidemia found unconscious and unresponsive at home presenting to [facility] with altered mental status, found to have R  facial droop. Transferred to [facility].   Altered Mental Status - syncope vs. TIA. Less likely seizure.   Code Stroke CT head No acute abnormality. Small vessel disease. ASPECTS 10.     MRI  No acute abnormality. Small vessel disease. Atrophy. Sinus dz.  CTA head & neck no ELVO. Proximal R ICA/bifurcation 40% stenosis. B siphon atherosclerosis. Small layering B pleural effusions.  2D Echo EF 60-65%. No source of embolus   EEG diffused encephalopathy, no sz   Recommend 30d cardiac event monitor to rule out AF  LDL 94  HgbA1c 8.6  VTE prophylaxis - SCDs  No antithrombotic prior to admission, now on aspirin 81 mg daily and clopidogrel 75 mg daily.  Continue DAPT x 3 weeks then aspirin alone    Therapy recommendations:  SNF (Pt refusing so recommended HH PT, RN, SW)  Disposition:  Return home  Hypertension  Stable . BP goal normotensive  Hyperlipidemia  Home meds:  On lipitor 10  Now on lipitor 20  LDL 94, goal < 70  Continue statin at discharge  Diabetes type II Uncontrolled  HgbA1c 8.6, goal < 7.0  CBGs  SSI  Close PCP follow up for better DM control  Other Stroke Risk Factors  Advanced age  Former Cigarette smoker, quit 51 yrs ago  Overweight, Body mass index is 27.89 kg/m., recommend weight loss, diet and exercise as appropriate   Other Active Problems  Recent COVID infection and treatment - COVID test still positive  Type I second-degree AV block - cardiology on board  Hypomagnesemia  Vascular dementia   CKD stage III - Cre stable  Hospital day # 4  Neurology will sign off. Please call with questions. Pt will follow up with stroke clinic NP at [facility] in about 4 weeks. Thanks for the consult.  Rosalin Hawking, MD PhD Stroke Neurology 11/18/2018 2:35 PM   To contact Stroke Continuity provider, please refer to [email or web address] After hours, contact General Neurology

## 2018-11-18 NOTE — Discharge Summary (Signed)
Physician Discharge Summary  Mike Briggs [id number] DOB: [birth year] DOA: 11/13/2018  PCP: Rosita Fire, MD  Admit date: 11/13/2018 Discharge date: 11/18/2018  Time spent: 60 minutes  Recommendations for Outpatient Follow-up:  1. Follow-up with Rosita Fire, MD in 2 to 3 weeks.  On follow-up patient will need a basic metabolic profile done to follow-up on a electrolytes and renal function.  Patient will need a magnesium level checked. 2. Follow-up with [facility] in 4 weeks.   Discharge Diagnoses:  Principal Problem:   Acute encephalopathy Active Problems:   Syncope   TIA (transient ischemic attack)   Type 2 diabetes mellitus with diabetic chronic kidney disease (HCC)   Chronic kidney disease, stage III (moderate) (HCC)   Vascular dementia (HCC)   Essential hypertension   HLD (hyperlipidemia)   COVID-19 virus infection   Mobitz type 1 second degree atrioventricular block   Discharge Condition: Stable and improved  Diet recommendation: Heart healthy  Filed Weights   11/13/18 1846  Weight: 90.7 kg    History of present illness:  HPI per Dr. Briscoe Burns  is a 85 y.o. male, with history of CKD stage III, essential hypertension, wheelchair-bound, who was just discharged from hospital after treatment for COVID-19 infection.  Patient was discharged on 11/11/2018.    On day of admission, EMS received a call that patient was unconscious and unresponsive.  On arrival of EMS patient was unconscious but will respond to painful stimuli.  They also noticed left-sided facial droop and initiated code stroke. In the ED patient mental status improved, he was evaluated by tele neurology and stroke work-up was recommended.  Initial CT head was negative.  Patient not found to be candidate for TPA. Patient denied chest pain or shortness of breath. No nausea vomiting or diarrhea. COVID-19 is again positive. Patient was admitted for TIA versus stroke  work-up. MRI not demonstrating any findings of CVA at this time. He did have a brief period of unconsciousness and discussed case with neurology Dr. Merlene Laughter who recommended EEG. 2D echocardiogram also performed which did not show any acute abnormality. Patient was also found to be in ?? new onset atrial fibrillation and he was started on heparin drip however he was rate controlled. He was subsequently transferred to [facility] for EEG.   Hospital Course:  1 acute encephalopathy/CVA ruled out/probable TIA versus syncope Patient had presented with unresponsiveness per initial HPI with some associated left facial weakness/droop and stroke work-up done with head CT which was negative an MRI of the head which was negative for any acute abnormalities.  EEG which was done showed mild diffuse encephalopathy, nonspecific to etiology.  No seizures were pulled up discharges seen throughout the recording.  Patient improved clinically and likely close to baseline.  2D echo which was done did not show any source of emboli.  Fasting lipid panel with LDL of 94.  Patient currently on aspirin and Plavix.    Patient maintained on a statin. Patient seen by cardiology and is felt patient does not have atrial fibrillation.  Spoke with neurology, Dr. Erlinda Hong who recommended CT angiogram of head and neck to evaluate vessels and assess the patient in consultation.  CT angiogram head and neck showed a proximal right ICA/bifurcation 40% stenosis.  B siphon atherosclerosis.  Neurology recommended a 30-day cardiac event monitor to rule out atrial fibrillation and recommended dual antiplatelet therapy x3 weeks with Plavix and aspirin and then subsequently aspirin alone.  Patient was seen by PT  OT who recommended SNF however patient refused and patient be discharged home on home health PT OT RN social worker and nurse aide.  Patient will follow up with neurology in 1 month.  Patient had no further episodes during the hospitalization  patient be discharged home in stable and improved condition.   2.  Type I second-degree AV block On admission there was concern for possible atrial fibrillation.  Cardiology was consulted and per cardiology patient likely with intermittent type I second-degree AV block.  Patient noted to be hemodynamically stable.  Per cardiology no evidence of atrial fibrillation.  Patient maintained on cardiac regimen of Norvasc and Toprol-XL.  Due to concern for TIA and syncope neurology who consulted on the patient recommended outpatient 30-day event monitor which will be arranged through cardiology.  3.  Recent COVID-19 infection and treatment Patient remained stable.  Patient was not hypoxic during his hospitalization.  Patient noted to have received all these treatments during recent hospitalization.    Patient was maintained on vitamin C and zinc.  Outpatient follow-up.   4.  Hypertension Stable.  Patient's antihypertensives were initially held to allow permissive hypertension for possible stroke however on admission patient noted not to be significantly hypertensive.  MRI head negative for any acute stroke.  Blood pressure remained stable.  Patient maintained on home regimen of Norvasc and Toprol-XL.  Patient's ACE inhibitor was discontinued during this hospitalization.  Outpatient follow-up.   5.  Type 2 diabetes mellitus Hemoglobin A1c 8.6 on 11/13/2018.   Patient's oral hypoglycemic agents were held during the hospitalization patient maintained on sliding scale insulin.  Oral hypoglycemic agents will be resumed on discharge.  6.  Hyperlipidemia Patient maintained on statin.  7.  Hypomagnesemia Repleted.  Outpatient follow-up.   8.  Vascular dementia Patient maintained on home regimen of Namenda.   9.  Chronic kidney disease stage III Stable.  Patient's ACE inhibitor was discontinued during this hospitalization.  10.  History of prior CVA Patient noted to have some residual right-sided  weakness.  Patient also during this hospitalization with some left facial weakness.  Patient subsequently placed on aspirin and Plavix as well as maintained on a statin.  Patient be continued on dual antiplatelet therapy x3 weeks and then aspirin alone.  Will need outpatient follow-up with neurology.  Procedures:  Chest x-ray 11/13/2018  MRI brain 11/14/2018  2D echo 11/14/2018  CT head 11/13/2018  EEG 11/15/2018  CT angiogram head and neck 11/18/2018  Consultations:  Telemetry neuro hospitalist Dr. Libby Maw 11/13/2018  Speech therapy for cognitive evaluation 11/17/2018  Cardiology  Neurology: Dr. Erlinda Hong 11/17/2018  Discharge Exam: Vitals:   11/18/18 0300 11/18/18 0827  BP: 133/78 (!) 110/55  Pulse: (!) 59 72  Resp:  (!) 22  Temp:  99.1 F (37.3 C)  SpO2: 98% 99%    General: NAD Cardiovascular: RRR Respiratory: CTAB  Discharge Instructions   Discharge Instructions    Ambulatory referral to Neurology   Complete by: As directed    Follow up with stroke clinic NP (Jessica Vanschaick or Cecille Rubin, if both not available, consider Zachery Dauer, or Ahern) at [facility] in about 4 weeks. Thanks.   Diet - low sodium heart healthy   Complete by: As directed    Discharge instructions   Complete by: As directed    ?   Person Under Monitoring Name: Mike Briggs  Location: [address] [place] [address]   Infection Prevention Recommendations for Individuals Confirmed to have, or  Being Evaluated for, 2019 Novel Coronavirus (COVID-19) Infection Who Receive Care at Home  Individuals who are confirmed to have, or are being evaluated for, COVID-19 should follow the prevention steps below until a healthcare provider or local or state health department says they can return to normal activities.  Stay home except to get medical care You should restrict activities outside your home, except for getting medical care. Do not go to work, school, or  public areas, and do not use public transportation or taxis.  Call ahead before visiting your doctor Before your medical appointment, call the healthcare provider and tell them that you have, or are being evaluated for, COVID-19 infection. This will help the healthcare provider's office take steps to keep other people from getting infected. Ask your healthcare provider to call the local or state health department.  Monitor your symptoms Seek prompt medical attention if your illness is worsening (e.g., difficulty breathing). Before going to your medical appointment, call the healthcare provider and tell them that you have, or are being evaluated for, COVID-19 infection. Ask your healthcare provider to call the local or state health department.  Wear a facemask You should wear a facemask that covers your nose and mouth when you are in the same room with other people and when you visit a healthcare provider. People who live with or visit you should also wear a facemask while they are in the same room with you.  Separate yourself from other people in your home As much as possible, you should stay in a different room from other people in your home. Also, you should use a separate bathroom, if available.  Avoid sharing household items You should not share dishes, drinking glasses, cups, eating utensils, towels, bedding, or other items with other people in your home. After using these items, you should wash them thoroughly with soap and water.  Cover your coughs and sneezes Cover your mouth and nose with a tissue when you cough or sneeze, or you can cough or sneeze into your sleeve. Throw used tissues in a lined trash can, and immediately wash your hands with soap and water for at least 20 seconds or use an alcohol-based hand rub.  Wash your [facility] your hands often and thoroughly with soap and water for at least 20 seconds. You can use an alcohol-based hand sanitizer if soap and water  are not available and if your hands are not visibly dirty. Avoid touching your eyes, nose, and mouth with unwashed hands.   Prevention Steps for Caregivers and Household Members of Individuals Confirmed to have, or Being Evaluated for, COVID-19 Infection Being Cared for in the Home  If you live with, or provide care at home for, a person confirmed to have, or being evaluated for, COVID-19 infection please follow these guidelines to prevent infection:  Follow healthcare provider's instructions Make sure that you understand and can help the patient follow any healthcare provider instructions for all care.  Provide for the patient's basic needs You should help the patient with basic needs in the home and provide support for getting groceries, prescriptions, and other personal needs.  Monitor the patient's symptoms If they are getting sicker, call his or her medical provider and tell them that the patient has, or is being evaluated for, COVID-19 infection. This will help the healthcare provider's office take steps to keep other people from getting infected. Ask the healthcare provider to call the local or state health department.  Limit the number of people  who have contact with the patient If possible, have only one caregiver for the patient. Other household members should stay in another home or place of residence. If this is not possible, they should stay in another room, or be separated from the patient as much as possible. Use a separate bathroom, if available. Restrict visitors who do not have an essential need to be in the home.  Keep older adults, very young children, and other sick people away from the patient Keep older adults, very young children, and those who have compromised immune systems or chronic health conditions away from the patient. This includes people with chronic heart, lung, or kidney conditions, diabetes, and cancer.  Ensure good ventilation Make sure that  shared spaces in the home have good air flow, such as from an air conditioner or an opened window, weather permitting.  Wash your hands often Wash your hands often and thoroughly with soap and water for at least 20 seconds. You can use an alcohol based hand sanitizer if soap and water are not available and if your hands are not visibly dirty. Avoid touching your eyes, nose, and mouth with unwashed hands. Use disposable paper towels to dry your hands. If not available, use dedicated cloth towels and replace them when they become wet.  Wear a facemask and gloves Wear a disposable facemask at all times in the room and gloves when you touch or have contact with the patient's blood, body fluids, and/or secretions or excretions, such as sweat, saliva, sputum, nasal mucus, vomit, urine, or feces.  Ensure the mask fits over your nose and mouth tightly, and do not touch it during use. Throw out disposable facemasks and gloves after using them. Do not reuse. Wash your hands immediately after removing your facemask and gloves. If your personal clothing becomes contaminated, carefully remove clothing and launder. Wash your hands after handling contaminated clothing. Place all used disposable facemasks, gloves, and other waste in a lined container before disposing them with other household waste. Remove gloves and wash your hands immediately after handling these items.  Do not share dishes, glasses, or other household items with the patient Avoid sharing household items. You should not share dishes, drinking glasses, cups, eating utensils, towels, bedding, or other items with a patient who is confirmed to have, or being evaluated for, COVID-19 infection. After the person uses these items, you should wash them thoroughly with soap and water.  Wash laundry thoroughly Immediately remove and wash clothes or bedding that have blood, body fluids, and/or secretions or excretions, such as sweat, saliva, sputum,  nasal mucus, vomit, urine, or feces, on them. Wear gloves when handling laundry from the patient. Read and follow directions on labels of laundry or clothing items and detergent. In general, wash and dry with the warmest temperatures recommended on the label.  Clean all areas the individual has used often Clean all touchable surfaces, such as counters, tabletops, doorknobs, bathroom fixtures, toilets, phones, keyboards, tablets, and bedside tables, every day. Also, clean any surfaces that may have blood, body fluids, and/or secretions or excretions on them. Wear gloves when cleaning surfaces the patient has come in contact with. Use a diluted bleach solution (e.g., dilute bleach with 1 part bleach and 10 parts water) or a household disinfectant with a label that says EPA-registered for coronaviruses. To make a bleach solution at home, add 1 tablespoon of bleach to 1 quart (4 cups) of water. For a larger supply, add  cup of bleach to 1 gallon (  16 cups) of water. Read labels of cleaning products and follow recommendations provided on product labels. Labels contain instructions for safe and effective use of the cleaning product including precautions you should take when applying the product, such as wearing gloves or eye protection and making sure you have good ventilation during use of the product. Remove gloves and wash hands immediately after cleaning.  Monitor yourself for signs and symptoms of illness Caregivers and household members are considered close contacts, should monitor their health, and will be asked to limit movement outside of the home to the extent possible. Follow the monitoring steps for close contacts listed on the symptom monitoring form.   ? If you have additional questions, contact your local health department or call the epidemiologist on call at [phone] (available 24/7). ? This guidance is subject to change. For the most up-to-date guidance from [facility], please refer to  their website: [email or web address]   Increase activity slowly   Complete by: As directed      Allergies as of 11/18/2018   No Known Allergies     Medication List    STOP taking these medications   lisinopril 40 MG tablet Commonly known as: ZESTRIL     TAKE these medications   amLODipine 10 MG tablet Commonly known as: NORVASC Take 1 tablet (10 mg total) by mouth daily.   ascorbic acid 500 MG tablet Commonly known as: VITAMIN C Take 1 tablet (500 mg total) by mouth daily. Start taking on: November 19, 2018   aspirin 81 MG chewable tablet Chew 1 tablet (81 mg total) by mouth daily. Start taking on: November 19, 2018   atorvastatin 10 MG tablet Commonly known as: LIPITOR TAKE 1 TABLET BY MOUTH AFTER SUPPER. What changed: See the new instructions.   clopidogrel 75 MG tablet Commonly known as: PLAVIX Take 1 tablet (75 mg total) by mouth daily for 21 days. Start taking on: November 19, 2018   colchicine 0.6 MG tablet Take 1 tablet (0.6 mg total) by mouth daily. What changed: when to take this   guaiFENesin 600 MG 12 hr tablet Commonly known as: MUCINEX Take 2 tablets (1,200 mg total) by mouth 2 (two) times daily for 7 days.   memantine 10 MG tablet Commonly known as: NAMENDA Take 10 mg by mouth 2 (two) times daily.   metFORMIN 500 MG tablet Commonly known as: Glucophage Take 1 tablet (500 mg total) by mouth 2 (two) times daily with a meal.   metoprolol succinate 25 MG 24 hr tablet Commonly known as: TOPROL-XL TAKE (1) TABLET BY MOUTH ONCE DAILY. What changed: See the new instructions.   tamsulosin 0.4 MG Caps capsule Commonly known as: FLOMAX Take 1 capsule (0.4 mg total) by mouth daily after breakfast. What changed: when to take this   zinc sulfate 220 (50 Zn) MG capsule Take 1 capsule (220 mg total) by mouth daily. Start taking on: November 19, 2018      No Known Allergies Follow-up Information     [facility]. Schedule an appointment as soon as possible for a visit in 4 week(s).   Specialty: Neurology Contact information: [address] [place] [phone]       Rosita Fire, MD. Schedule an appointment as soon as possible for a visit in 3 week(s).   Specialty: Internal Medicine Why: f/u in 2-3 weeks. Contact information: [place] [address] [phone]  The results of significant diagnostics from this hospitalization (including imaging, microbiology, ancillary and laboratory) are listed below for reference.    Significant Diagnostic Studies: Ct Angio Head W Or Wo Contrast  Result Date: 11/18/2018 CLINICAL DATA:  Initial evaluation for TIA, initial exam. Altered mental status with left facial droop. EXAM: CT ANGIOGRAPHY HEAD AND NECK TECHNIQUE: Multidetector CT imaging of the head and neck was performed using the standard protocol during bolus administration of intravenous contrast. Multiplanar CT image reconstructions and MIPs were obtained to evaluate the vascular anatomy. Carotid stenosis measurements (when applicable) are obtained utilizing NASCET criteria, using the distal internal carotid diameter as the denominator. CONTRAST:  35mL OMNIPAQUE IOHEXOL 350 MG/ML SOLN COMPARISON:  Prior head CT from 11/13/2018 as well as previous brain MRI from 11/14/2018. FINDINGS: CT HEAD FINDINGS Brain: Moderately advanced cerebral atrophy with chronic small vessel ischemic disease, stable. No acute intracranial hemorrhage. No acute large vessel territory infarct. No mass lesion, midline shift or mass effect. No hydrocephalus. No extra-axial fluid collection. Vascular: No hyperdense vessel. Scattered vascular calcifications noted within the carotid siphons. Skull: Scalp soft tissues and calvarium within normal limits. Sinuses: Scattered mucosal thickening noted within the ethmoidal air cells,  sphenoid sinuses, and maxillary sinuses, with small amount of layering fluid within the sphenoid sinuses bilaterally. Trace bilateral mastoid effusions. Orbits: Globes and orbital soft tissues demonstrate no acute finding. Review of the MIP images confirms the above findings CTA NECK FINDINGS Aortic arch: Visualized aortic arch of normal caliber with normal 3 vessel morphology. Mild atheromatous plaque within the distal arch. No hemodynamically significant stenosis about the origin of the great vessels. Visualized subclavian arteries widely patent. Right carotid system: Right CCA widely patent from its origin to the bifurcation without stenosis. Mix concentric plaque about the right bifurcation/proximal right ICA with associated stenosis of up to 40% by NASCET criteria. Right ICA otherwise widely patent distally to the skull base without stenosis, dissection or occlusion. Left carotid system: Left CCA widely patent from its origin to the bifurcation without stenosis. Mild eccentric plaque at the origin of the left ICA without hemodynamically significant stenosis. Left ICA widely patent distally to the skull base without stenosis, dissection, or occlusion. Vertebral arteries: Both vertebral arteries arise from the subclavian arteries. Right vertebral artery dominant. Vertebral arteries widely patent within the neck without stenosis, dissection or occlusion. Skeleton: No acute osseous finding. No discrete lytic or blastic osseous lesions. Prior fusion at C3-C7. Patient is edentulous. Other neck: No other acute soft tissue abnormality within the neck. No mass lesion or adenopathy. Upper chest: Small layering bilateral pleural effusions partially visualized. Associated scattered atelectatic changes. Visualized upper chest demonstrates no other acute finding. Review of the MIP images confirms the above findings CTA HEAD FINDINGS Anterior circulation: Petrous segments widely patent. Scattered atheromatous plaque within  the cavernous/supraclinoid ICAs with associated mild to moderate multifocal narrowing. ICA termini well perfused. A1 segments patent bilaterally. Normal anterior communicating artery complex. Anterior cerebral arteries widely patent to their distal aspects without stenosis. No M1 stenosis or occlusion. Normal MCA bifurcations. Distal MCA branches well perfused and symmetric. Posterior circulation: Focal atheromatous plaque within the dominant right V4 segment with short-segment mild stenosis. Right vertebral otherwise widely patent. Left V4 segment widely patent to the vertebrobasilar junction. Patent right PICA. Left PICA not seen. Dominant left AICA. Basilar patent to its distal aspect without flow-limiting stenosis. Superior cerebral arteries patent bilaterally. Both of the posterior cerebral arteries primarily supplied via the basilar and are well perfused to their  distal aspects without stenosis. Venous sinuses: Patent. Anatomic variants: None significant. Review of the MIP images confirms the above findings IMPRESSION: CT HEAD IMPRESSION: 1. Stable head CT.  No acute intracranial abnormality identified. 2. Atrophy with chronic small vessel ischemic disease, unchanged. CTA HEAD AND NECK IMPRESSION: 1. Negative CTA for large vessel occlusion. 2. 40% atheromatous stenosis about the right bifurcation/proximal right ICA. 3. Moderate atherosclerotic change about the carotid siphons without hemodynamically significant stenosis. 4. Otherwise wide patency of the major arterial vasculature of the head and neck. No other hemodynamically significant or correctable stenosis. 5. Small layering bilateral pleural effusions with associated atelectasis, partially visualized. Electronically Signed   By: Jeannine Boga M.D.   On: 11/18/2018 05:06   Ct Angio Neck W Or Wo Contrast  Result Date: 11/18/2018 CLINICAL DATA:  Initial evaluation for TIA, initial exam. Altered mental status with left facial droop. EXAM: CT  ANGIOGRAPHY HEAD AND NECK TECHNIQUE: Multidetector CT imaging of the head and neck was performed using the standard protocol during bolus administration of intravenous contrast. Multiplanar CT image reconstructions and MIPs were obtained to evaluate the vascular anatomy. Carotid stenosis measurements (when applicable) are obtained utilizing NASCET criteria, using the distal internal carotid diameter as the denominator. CONTRAST:  99mL OMNIPAQUE IOHEXOL 350 MG/ML SOLN COMPARISON:  Prior head CT from 11/13/2018 as well as previous brain MRI from 11/14/2018. FINDINGS: CT HEAD FINDINGS Brain: Moderately advanced cerebral atrophy with chronic small vessel ischemic disease, stable. No acute intracranial hemorrhage. No acute large vessel territory infarct. No mass lesion, midline shift or mass effect. No hydrocephalus. No extra-axial fluid collection. Vascular: No hyperdense vessel. Scattered vascular calcifications noted within the carotid siphons. Skull: Scalp soft tissues and calvarium within normal limits. Sinuses: Scattered mucosal thickening noted within the ethmoidal air cells, sphenoid sinuses, and maxillary sinuses, with small amount of layering fluid within the sphenoid sinuses bilaterally. Trace bilateral mastoid effusions. Orbits: Globes and orbital soft tissues demonstrate no acute finding. Review of the MIP images confirms the above findings CTA NECK FINDINGS Aortic arch: Visualized aortic arch of normal caliber with normal 3 vessel morphology. Mild atheromatous plaque within the distal arch. No hemodynamically significant stenosis about the origin of the great vessels. Visualized subclavian arteries widely patent. Right carotid system: Right CCA widely patent from its origin to the bifurcation without stenosis. Mix concentric plaque about the right bifurcation/proximal right ICA with associated stenosis of up to 40% by NASCET criteria. Right ICA otherwise widely patent distally to the skull base without  stenosis, dissection or occlusion. Left carotid system: Left CCA widely patent from its origin to the bifurcation without stenosis. Mild eccentric plaque at the origin of the left ICA without hemodynamically significant stenosis. Left ICA widely patent distally to the skull base without stenosis, dissection, or occlusion. Vertebral arteries: Both vertebral arteries arise from the subclavian arteries. Right vertebral artery dominant. Vertebral arteries widely patent within the neck without stenosis, dissection or occlusion. Skeleton: No acute osseous finding. No discrete lytic or blastic osseous lesions. Prior fusion at C3-C7. Patient is edentulous. Other neck: No other acute soft tissue abnormality within the neck. No mass lesion or adenopathy. Upper chest: Small layering bilateral pleural effusions partially visualized. Associated scattered atelectatic changes. Visualized upper chest demonstrates no other acute finding. Review of the MIP images confirms the above findings CTA HEAD FINDINGS Anterior circulation: Petrous segments widely patent. Scattered atheromatous plaque within the cavernous/supraclinoid ICAs with associated mild to moderate multifocal narrowing. ICA termini well perfused. A1 segments patent bilaterally. Normal  anterior communicating artery complex. Anterior cerebral arteries widely patent to their distal aspects without stenosis. No M1 stenosis or occlusion. Normal MCA bifurcations. Distal MCA branches well perfused and symmetric. Posterior circulation: Focal atheromatous plaque within the dominant right V4 segment with short-segment mild stenosis. Right vertebral otherwise widely patent. Left V4 segment widely patent to the vertebrobasilar junction. Patent right PICA. Left PICA not seen. Dominant left AICA. Basilar patent to its distal aspect without flow-limiting stenosis. Superior cerebral arteries patent bilaterally. Both of the posterior cerebral arteries primarily supplied via the basilar  and are well perfused to their distal aspects without stenosis. Venous sinuses: Patent. Anatomic variants: None significant. Review of the MIP images confirms the above findings IMPRESSION: CT HEAD IMPRESSION: 1. Stable head CT.  No acute intracranial abnormality identified. 2. Atrophy with chronic small vessel ischemic disease, unchanged. CTA HEAD AND NECK IMPRESSION: 1. Negative CTA for large vessel occlusion. 2. 40% atheromatous stenosis about the right bifurcation/proximal right ICA. 3. Moderate atherosclerotic change about the carotid siphons without hemodynamically significant stenosis. 4. Otherwise wide patency of the major arterial vasculature of the head and neck. No other hemodynamically significant or correctable stenosis. 5. Small layering bilateral pleural effusions with associated atelectasis, partially visualized. Electronically Signed   By: Jeannine Boga M.D.   On: 11/18/2018 05:06   Mr Brain Wo Contrast  Result Date: 11/14/2018 CLINICAL DATA:  Altered level of consciousness (LOC), unexplained. Additional history provided: COVID + patient, history of stroke, nonverbal, left-sided facial droop upon arrival, patient found unresponsive yesterday EXAM: MRI HEAD WITHOUT CONTRAST TECHNIQUE: Multiplanar, multiecho pulse sequences of the brain and surrounding structures were obtained without intravenous contrast. COMPARISON:  Head CT 11/13/2018, brain MRI 10/22/2012 FINDINGS: Brain: Multiple sequences are motion degraded, limiting evaluation. There is no convincing evidence of acute infarct. No evidence of intracranial mass. No midline shift or extra-axial fluid collection. No chronic intracranial blood products. Moderate generalized parenchymal atrophy with advanced chronic small vessel ischemic disease, similar to prior MRI 10/22/2012. Vascular: Flow voids maintained within the proximal large arterial vessels. Skull and upper cervical spine: No focal calvarial marrow lesion. T1 hypointense  marrow signal within the partially visualized C3 vertebra may be degenerative. Sinuses/Orbits: Bilateral lens replacements. Mild scattered paranasal sinus mucosal thickening. Small bilateral sphenoid sinus air-fluid levels. No significant mastoid effusion. IMPRESSION: 1. Motion degraded examination. 2. No evidence of acute intracranial abnormality. 3. Generalized parenchymal atrophy and chronic small vessel ischemic disease, similar to prior MRI 10/22/2012. 4. Paranasal sinus disease with small bilateral sphenoid sinus air-fluid levels. Correlate for acute sinusitis. Electronically Signed   By: Kellie Simmering   On: 11/14/2018 08:59   Dg Chest Portable 1 View  Result Date: 11/13/2018 CLINICAL DATA:  Altered mental status.  COVID-19 positive. EXAM: PORTABLE CHEST 1 VIEW COMPARISON:  11/02/2018 FINDINGS: Stable mild cardiomegaly. Mildly increased interstitial opacities throughout both lungs compared to prior. No focal consolidation. Possible trace right pleural effusion. No pneumothorax. IMPRESSION: Diffuse bilateral interstitial opacities, new from prior, which could reflect edema versus an atypical/viral infection. Electronically Signed   By: Davina Poke M.D.   On: 11/13/2018 21:02   Dg Chest Portable 1 View  Result Date: 11/02/2018 CLINICAL DATA:  Cough, increased weakness EXAM: PORTABLE CHEST 1 VIEW COMPARISON:  02/25/2018, 10/20/2012 FINDINGS: The heart size and mediastinal contours are within normal limits. Underpenetrated AP portable examination without acute appearing abnormality of the lungs. The visualized skeletal structures are unremarkable. IMPRESSION: Underpenetrated AP portable examination without acute appearing abnormality of the lungs. Electronically Signed  By: Eddie Candle M.D.   On: 11/02/2018 13:08   Ct Head Code Stroke Wo Contrast  Addendum Date: 11/13/2018   ADDENDUM REPORT: 11/13/2018 19:51 ADDENDUM: Study discussed by telephone with Dr. Nat Christen on 11/13/2018 at [place] hours.  Electronically Signed   By: Genevie Ann M.D.   On: 11/13/2018 19:51   Result Date: 11/13/2018 CLINICAL DATA:  Code stroke. 85 year old COVID-19 positive male was unresponsive with left facial droop. EXAM: CT HEAD WITHOUT CONTRAST TECHNIQUE: Contiguous axial images were obtained from the base of the skull through the vertex without intravenous contrast. COMPARISON:  Head CT 02/25/2018. Brain MRI 10/22/2012. FINDINGS: Brain: Stable cerebral volume since January. No midline shift, mass effect, or evidence of intracranial mass lesion. No ventriculomegaly. No acute intracranial hemorrhage identified. Patchy and confluent bilateral cerebral white matter hypodensity appears stable along with similar hypodensity suggested in the pons (series 2, image 7). No cortically based acute infarct identified. Vascular: Calcified atherosclerosis at the skull base. No suspicious intracranial vascular hyperdensity. Skull: No acute osseous abnormality identified. Sinuses/Orbits: Minor paranasal sinus mucosal thickening. Tympanic cavities and mastoids remain clear. Other: No acute orbit or scalp soft tissue findings. ASPECTS [facility] Stroke Program Early CT Score) Total score (0-10 with 10 being normal): 10 IMPRESSION: 1. Stable non contrast CT appearance of the brain since January with chronic small vessel disease. 2.  ASPECTS 10. Electronically Signed: By: Genevie Ann M.D. On: 11/13/2018 19:15    Microbiology: Recent Results (from the past 240 hour(s))  SARS Coronavirus 2 by RT PCR (hospital order, performed in [facility] hospital lab) Nasopharyngeal Nasopharyngeal Swab     Status: Abnormal   Collection Time: 11/13/18 10:09 PM   Specimen: Nasopharyngeal Swab  Result Value Ref Range Status   SARS Coronavirus 2 POSITIVE (A) NEGATIVE Final    Comment: RESULT CALLED TO, READ BACK BY AND VERIFIED WITH: J APPELT,RN @0027  11/14/18 MKELLY (NOTE) If result is NEGATIVE SARS-CoV-2 target nucleic acids are NOT DETECTED. The SARS-CoV-2  RNA is generally detectable in upper and lower  respiratory specimens during the acute phase of infection. The lowest  concentration of SARS-CoV-2 viral copies this assay can detect is 250  copies / mL. A negative result does not preclude SARS-CoV-2 infection  and should not be used as the sole basis for treatment or other  patient management decisions.  A negative result may occur with  improper specimen collection / handling, submission of specimen other  than nasopharyngeal swab, presence of viral mutation(s) within the  areas targeted by this assay, and inadequate number of viral copies  (<250 copies / mL). A negative result must be combined with clinical  observations, patient history, and epidemiological information. If result is POSITIVE SARS-CoV-2 target nucleic acids are DETECTED. The  SARS-CoV-2 RNA is generally detectable in upper and lower  respiratory specimens during the acute phase of infection.  Positive  results are indicative of active infection with SARS-CoV-2.  Clinical  correlation with patient history and other diagnostic information is  necessary to determine patient infection status.  Positive results do  not rule out bacterial infection or co-infection with other viruses. If result is PRESUMPTIVE POSTIVE SARS-CoV-2 nucleic acids MAY BE PRESENT.   A presumptive positive result was obtained on the submitted specimen  and confirmed on repeat testing.  While 2019 novel coronavirus  (SARS-CoV-2) nucleic acids may be present in the submitted sample  additional confirmatory testing may be necessary for epidemiological  and / or clinical management purposes  to differentiate  between  SARS-CoV-2 and other Sarbecovirus currently known to infect humans.  If clinically indicated additional testing with an alternate test  methodology [phone]) is a dvised. The SARS-CoV-2 RNA is generally  detectable in upper and lower respiratory specimens during the acute  phase of  infection. The expected result is Negative. Fact Sheet for Patients:  StrictlyIdeas.no Fact Sheet for Healthcare Providers: BankingDealers.co.za This test is not yet approved or cleared by the [place] FDA and has been authorized for detection and/or diagnosis of SARS-CoV-2 by FDA under an Emergency Use Authorization (EUA).  This EUA will remain in effect (meaning this test can be used) for the duration of the COVID-19 declaration under Section 564(b)(1) of the Act, 21 U.S.C. section 360bbb-3(b)(1), unless the authorization is terminated or revoked sooner. Performed at [facility], [address] [place], [place] [address]      Labs: Basic Metabolic Panel: Recent Labs  Lab 11/13/18 1845 11/15/18 0911 11/16/18 0728 11/17/18 1131 11/18/18 0356  NA 139 141 140 137 138  K 3.5 3.8 3.5 3.5 4.1  CL 103 108 111 107 108  CO2 24 24 19* 22 21*  GLUCOSE 213* 160* 126* 207* 179*  BUN 28* 15 14 14 15   CREATININE 1.49* 0.98 1.20 1.31* 1.23  CALCIUM 8.6* 8.3* 8.0* 8.0* 8.0*  MG  --   --  1.3* 1.2* 1.8   Liver Function Tests: Recent Labs  Lab 11/13/18 1845 11/16/18 0728  AST 26 26  ALT 24 20  ALKPHOS 84 75  BILITOT 0.4 0.4  PROT 6.7 5.5*  ALBUMIN 2.5* 2.0*   No results for input(s): LIPASE, AMYLASE in the last 168 hours. No results for input(s): AMMONIA in the last 168 hours. CBC: Recent Labs  Lab 11/13/18 1845 11/15/18 0911 11/16/18 0728 11/17/18 1131 11/18/18 0356  WBC 8.0 6.5 6.1 5.8  --   NEUTROABS 5.1  --   --  3.4  --   HGB 9.9* 9.8* 9.5* 9.5* 9.3*  HCT 32.5* 31.1* 29.2* 29.4* 28.8*  MCV 97.9 96.0 94.8 94.5  --   PLT 300 318 300 302  --    Cardiac Enzymes: No results for input(s): CKTOTAL, CKMB, CKMBINDEX, TROPONINI in the last 168 hours. BNP: BNP (last 3 results) No results for input(s): BNP in the last 8760 hours.  ProBNP (last 3 results) No results for input(s): PROBNP in the last 8760  hours.  CBG: Recent Labs  Lab 11/17/18 1244 11/17/18 1625 11/17/18 2029 11/18/18 0825 11/18/18 1158  GLUCAP 212* 194* 195* 155* 192*       Signed:  Irine Seal MD.  [facility] 11/18/2018, 2:55 PM

## 2018-11-18 NOTE — Progress Notes (Signed)
Transport here to pick up patient and discharge home. IV removed. Patient belongings, discharge paperwork and prescriptions given to transport team. VS stable. Pt left by stretcher

## 2018-11-18 NOTE — Progress Notes (Signed)
    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

## 2018-11-18 NOTE — Plan of Care (Signed)
  Problem: Clinical Measurements: Goal: Ability to maintain clinical measurements within normal limits will improve Outcome: Adequate for Discharge Goal: Will remain free from infection Outcome: Adequate for Discharge Goal: Diagnostic test results will improve Outcome: Adequate for Discharge Goal: Respiratory complications will improve Outcome: Adequate for Discharge Goal: Cardiovascular complication will be avoided Outcome: Adequate for Discharge   

## 2018-11-20 DIAGNOSIS — J90 Pleural effusion, not elsewhere classified: Secondary | ICD-10-CM | POA: Diagnosis not present

## 2018-11-20 DIAGNOSIS — E785 Hyperlipidemia, unspecified: Secondary | ICD-10-CM | POA: Diagnosis not present

## 2018-11-20 DIAGNOSIS — I69398 Other sequelae of cerebral infarction: Secondary | ICD-10-CM | POA: Diagnosis not present

## 2018-11-20 DIAGNOSIS — I69351 Hemiplegia and hemiparesis following cerebral infarction affecting right dominant side: Secondary | ICD-10-CM | POA: Diagnosis not present

## 2018-11-20 DIAGNOSIS — Z9181 History of falling: Secondary | ICD-10-CM | POA: Diagnosis not present

## 2018-11-20 DIAGNOSIS — F0150 Vascular dementia without behavioral disturbance: Secondary | ICD-10-CM | POA: Diagnosis not present

## 2018-11-20 DIAGNOSIS — M6282 Rhabdomyolysis: Secondary | ICD-10-CM | POA: Diagnosis not present

## 2018-11-20 DIAGNOSIS — N400 Enlarged prostate without lower urinary tract symptoms: Secondary | ICD-10-CM | POA: Diagnosis not present

## 2018-11-20 DIAGNOSIS — I441 Atrioventricular block, second degree: Secondary | ICD-10-CM | POA: Diagnosis not present

## 2018-11-20 DIAGNOSIS — E860 Dehydration: Secondary | ICD-10-CM | POA: Diagnosis not present

## 2018-11-20 DIAGNOSIS — Z7902 Long term (current) use of antithrombotics/antiplatelets: Secondary | ICD-10-CM | POA: Diagnosis not present

## 2018-11-20 DIAGNOSIS — Z981 Arthrodesis status: Secondary | ICD-10-CM | POA: Diagnosis not present

## 2018-11-20 DIAGNOSIS — E1122 Type 2 diabetes mellitus with diabetic chronic kidney disease: Secondary | ICD-10-CM | POA: Diagnosis not present

## 2018-11-20 DIAGNOSIS — Z7984 Long term (current) use of oral hypoglycemic drugs: Secondary | ICD-10-CM | POA: Diagnosis not present

## 2018-11-20 DIAGNOSIS — Z87440 Personal history of urinary (tract) infections: Secondary | ICD-10-CM | POA: Diagnosis not present

## 2018-11-20 DIAGNOSIS — G9340 Encephalopathy, unspecified: Secondary | ICD-10-CM | POA: Diagnosis not present

## 2018-11-20 DIAGNOSIS — I1310 Hypertensive heart and chronic kidney disease without heart failure, with stage 1 through stage 4 chronic kidney disease, or unspecified chronic kidney disease: Secondary | ICD-10-CM | POA: Diagnosis not present

## 2018-11-20 DIAGNOSIS — Z7982 Long term (current) use of aspirin: Secondary | ICD-10-CM | POA: Diagnosis not present

## 2018-11-20 DIAGNOSIS — J9811 Atelectasis: Secondary | ICD-10-CM | POA: Diagnosis not present

## 2018-11-20 DIAGNOSIS — U071 COVID-19: Secondary | ICD-10-CM | POA: Diagnosis not present

## 2018-11-20 DIAGNOSIS — I69392 Facial weakness following cerebral infarction: Secondary | ICD-10-CM | POA: Diagnosis not present

## 2018-11-20 DIAGNOSIS — G319 Degenerative disease of nervous system, unspecified: Secondary | ICD-10-CM | POA: Diagnosis not present

## 2018-11-20 DIAGNOSIS — N183 Chronic kidney disease, stage 3 unspecified: Secondary | ICD-10-CM | POA: Diagnosis not present

## 2018-11-24 NOTE — Progress Notes (Signed)
Code Stroke CT times 11/13/2018 1839 call time [place] beeper time 1854 exam started 1858 exam finished 1858 images sent to [facility] exam completed in [facility] called

## 2018-11-28 DIAGNOSIS — G459 Transient cerebral ischemic attack, unspecified: Secondary | ICD-10-CM | POA: Diagnosis not present

## 2018-11-28 DIAGNOSIS — G9340 Encephalopathy, unspecified: Secondary | ICD-10-CM | POA: Diagnosis not present

## 2018-11-28 DIAGNOSIS — E1122 Type 2 diabetes mellitus with diabetic chronic kidney disease: Secondary | ICD-10-CM | POA: Diagnosis not present

## 2018-11-28 DIAGNOSIS — N1831 Chronic kidney disease, stage 3a: Secondary | ICD-10-CM | POA: Diagnosis not present

## 2018-12-03 DIAGNOSIS — U071 COVID-19: Secondary | ICD-10-CM | POA: Diagnosis not present

## 2018-12-03 DIAGNOSIS — E860 Dehydration: Secondary | ICD-10-CM | POA: Diagnosis not present

## 2018-12-17 ENCOUNTER — Encounter (HOSPITAL_COMMUNITY): Payer: Self-pay | Admitting: Emergency Medicine

## 2018-12-17 ENCOUNTER — Emergency Department (HOSPITAL_COMMUNITY): Payer: Medicare Other

## 2018-12-17 ENCOUNTER — Other Ambulatory Visit: Payer: Self-pay

## 2018-12-17 ENCOUNTER — Other Ambulatory Visit (HOSPITAL_COMMUNITY): Payer: Self-pay | Admitting: Internal Medicine

## 2018-12-17 ENCOUNTER — Ambulatory Visit (HOSPITAL_COMMUNITY)
Admission: RE | Admit: 2018-12-17 | Discharge: 2018-12-17 | Disposition: A | Discharge status: Home / Self Care | Payer: Medicare Other | Source: Ambulatory Visit | Attending: Internal Medicine | Admitting: Internal Medicine

## 2018-12-17 ENCOUNTER — Inpatient Hospital Stay (HOSPITAL_COMMUNITY)
Admission: EM | Admit: 2018-12-17 | Discharge: 2018-12-20 | DRG: 690 | Disposition: A | Discharge status: Home Health | Payer: Medicare Other | Attending: Internal Medicine | Admitting: Internal Medicine

## 2018-12-17 DIAGNOSIS — Z8619 Personal history of other infectious and parasitic diseases: Secondary | ICD-10-CM

## 2018-12-17 DIAGNOSIS — Z7984 Long term (current) use of oral hypoglycemic drugs: Secondary | ICD-10-CM

## 2018-12-17 DIAGNOSIS — R52 Pain, unspecified: Secondary | ICD-10-CM | POA: Diagnosis not present

## 2018-12-17 DIAGNOSIS — N390 Urinary tract infection, site not specified: Secondary | ICD-10-CM | POA: Diagnosis not present

## 2018-12-17 DIAGNOSIS — Z87891 Personal history of nicotine dependence: Secondary | ICD-10-CM

## 2018-12-17 DIAGNOSIS — N433 Hydrocele, unspecified: Secondary | ICD-10-CM | POA: Diagnosis not present

## 2018-12-17 DIAGNOSIS — M1711 Unilateral primary osteoarthritis, right knee: Secondary | ICD-10-CM | POA: Diagnosis not present

## 2018-12-17 DIAGNOSIS — R0602 Shortness of breath: Secondary | ICD-10-CM | POA: Diagnosis not present

## 2018-12-17 DIAGNOSIS — Z87448 Personal history of other diseases of urinary system: Secondary | ICD-10-CM

## 2018-12-17 DIAGNOSIS — M25561 Pain in right knee: Secondary | ICD-10-CM

## 2018-12-17 DIAGNOSIS — E1122 Type 2 diabetes mellitus with diabetic chronic kidney disease: Secondary | ICD-10-CM | POA: Diagnosis present

## 2018-12-17 DIAGNOSIS — R001 Bradycardia, unspecified: Secondary | ICD-10-CM

## 2018-12-17 DIAGNOSIS — Z8673 Personal history of transient ischemic attack (TIA), and cerebral infarction without residual deficits: Secondary | ICD-10-CM

## 2018-12-17 DIAGNOSIS — Z20828 Contact with and (suspected) exposure to other viral communicable diseases: Secondary | ICD-10-CM | POA: Diagnosis present

## 2018-12-17 DIAGNOSIS — N179 Acute kidney failure, unspecified: Secondary | ICD-10-CM | POA: Diagnosis not present

## 2018-12-17 DIAGNOSIS — Z7982 Long term (current) use of aspirin: Secondary | ICD-10-CM

## 2018-12-17 DIAGNOSIS — Z9181 History of falling: Secondary | ICD-10-CM

## 2018-12-17 DIAGNOSIS — N183 Chronic kidney disease, stage 3 unspecified: Secondary | ICD-10-CM | POA: Diagnosis not present

## 2018-12-17 DIAGNOSIS — I10 Essential (primary) hypertension: Secondary | ICD-10-CM | POA: Diagnosis present

## 2018-12-17 DIAGNOSIS — I441 Atrioventricular block, second degree: Secondary | ICD-10-CM | POA: Diagnosis not present

## 2018-12-17 DIAGNOSIS — N3000 Acute cystitis without hematuria: Secondary | ICD-10-CM

## 2018-12-17 DIAGNOSIS — E119 Type 2 diabetes mellitus without complications: Secondary | ICD-10-CM | POA: Diagnosis not present

## 2018-12-17 DIAGNOSIS — E785 Hyperlipidemia, unspecified: Secondary | ICD-10-CM | POA: Diagnosis present

## 2018-12-17 DIAGNOSIS — M109 Gout, unspecified: Secondary | ICD-10-CM | POA: Diagnosis present

## 2018-12-17 DIAGNOSIS — E782 Mixed hyperlipidemia: Secondary | ICD-10-CM

## 2018-12-17 DIAGNOSIS — M25462 Effusion, left knee: Secondary | ICD-10-CM | POA: Diagnosis not present

## 2018-12-17 DIAGNOSIS — N432 Other hydrocele: Secondary | ICD-10-CM | POA: Diagnosis present

## 2018-12-17 DIAGNOSIS — M25562 Pain in left knee: Secondary | ICD-10-CM | POA: Insufficient documentation

## 2018-12-17 DIAGNOSIS — U071 COVID-19: Secondary | ICD-10-CM | POA: Diagnosis not present

## 2018-12-17 DIAGNOSIS — N400 Enlarged prostate without lower urinary tract symptoms: Secondary | ICD-10-CM | POA: Diagnosis present

## 2018-12-17 DIAGNOSIS — Z79899 Other long term (current) drug therapy: Secondary | ICD-10-CM

## 2018-12-17 DIAGNOSIS — W19XXXA Unspecified fall, initial encounter: Secondary | ICD-10-CM | POA: Diagnosis not present

## 2018-12-17 DIAGNOSIS — R609 Edema, unspecified: Secondary | ICD-10-CM | POA: Diagnosis not present

## 2018-12-17 DIAGNOSIS — Z981 Arthrodesis status: Secondary | ICD-10-CM

## 2018-12-17 DIAGNOSIS — B961 Klebsiella pneumoniae [K. pneumoniae] as the cause of diseases classified elsewhere: Secondary | ICD-10-CM | POA: Diagnosis not present

## 2018-12-17 HISTORY — DX: Acute cystitis without hematuria: N30.00

## 2018-12-17 LAB — CBC WITH DIFFERENTIAL/PLATELET
Abs Immature Granulocytes: 0.02 10*3/uL (ref 0.00–0.07)
Basophils Absolute: 0 10*3/uL (ref 0.0–0.1)
Basophils Relative: 1 %
Eosinophils Absolute: 0.2 10*3/uL (ref 0.0–0.5)
Eosinophils Relative: 3 %
HCT: 36.9 % — ABNORMAL LOW (ref 39.0–52.0)
Hemoglobin: 11.2 g/dL — ABNORMAL LOW (ref 13.0–17.0)
Immature Granulocytes: 0 %
Lymphocytes Relative: 30 %
Lymphs Abs: 2.2 10*3/uL (ref 0.7–4.0)
MCH: 30.9 pg (ref 26.0–34.0)
MCHC: 30.4 g/dL (ref 30.0–36.0)
MCV: 101.9 fL — ABNORMAL HIGH (ref 80.0–100.0)
Monocytes Absolute: 0.7 10*3/uL (ref 0.1–1.0)
Monocytes Relative: 9 %
Neutro Abs: 4.2 10*3/uL (ref 1.7–7.7)
Neutrophils Relative %: 57 %
Platelets: 202 10*3/uL (ref 150–400)
RBC: 3.62 MIL/uL — ABNORMAL LOW (ref 4.22–5.81)
RDW: 13.2 % (ref 11.5–15.5)
WBC: 7.3 10*3/uL (ref 4.0–10.5)
nRBC: 0 % (ref 0.0–0.2)

## 2018-12-17 LAB — COMPREHENSIVE METABOLIC PANEL
ALT: 11 U/L (ref 0–44)
AST: 15 U/L (ref 15–41)
Albumin: 3.1 g/dL — ABNORMAL LOW (ref 3.5–5.0)
Alkaline Phosphatase: 119 U/L (ref 38–126)
Anion gap: 9 (ref 5–15)
BUN: 29 mg/dL — ABNORMAL HIGH (ref 8–23)
CO2: 21 mmol/L — ABNORMAL LOW (ref 22–32)
Calcium: 8.7 mg/dL — ABNORMAL LOW (ref 8.9–10.3)
Chloride: 110 mmol/L (ref 98–111)
Creatinine, Ser: 1.5 mg/dL — ABNORMAL HIGH (ref 0.61–1.24)
GFR calc Af Amer: 47 mL/min — ABNORMAL LOW (ref >60)
GFR calc non Af Amer: 41 mL/min — ABNORMAL LOW (ref >60)
Glucose, Bld: 155 mg/dL — ABNORMAL HIGH (ref 70–99)
Potassium: 4.5 mmol/L (ref 3.5–5.1)
Sodium: 140 mmol/L (ref 135–145)
Total Bilirubin: 0.6 mg/dL (ref 0.3–1.2)
Total Protein: 6.7 g/dL (ref 6.5–8.1)

## 2018-12-17 LAB — URINALYSIS, ROUTINE W REFLEX MICROSCOPIC
Bilirubin Urine: NEGATIVE
Glucose, UA: NEGATIVE mg/dL
Hgb urine dipstick: NEGATIVE
Ketones, ur: NEGATIVE mg/dL
Nitrite: NEGATIVE
Protein, ur: 30 mg/dL — AB
Specific Gravity, Urine: 1.016 (ref 1.005–1.030)
WBC, UA: >50 WBC/hpf — ABNORMAL HIGH (ref 0–5)
pH: 5 (ref 5.0–8.0)

## 2018-12-17 MED ORDER — SODIUM CHLORIDE 0.9 % IV SOLN
1.0000 g | Freq: Once | INTRAVENOUS | Status: AC
  Administered 2018-12-17: 22:00:00 1 g via INTRAVENOUS
  Filled 2018-12-17: qty 10

## 2018-12-17 MED ORDER — SODIUM CHLORIDE 0.9 % IV BOLUS
500.0000 mL | Freq: Once | INTRAVENOUS | Status: AC
  Administered 2018-12-17: 22:00:00 500 mL via INTRAVENOUS

## 2018-12-17 MED ORDER — SODIUM CHLORIDE 0.9 % IV BOLUS: 500.0000 mL | Freq: Once | INTRAVENOUS | Status: DC

## 2018-12-17 NOTE — H&P (Signed)
History and Physical    PLEASE NOTE THAT DRAGON DICTATION SOFTWARE WAS USED IN THE CONSTRUCTION OF THIS NOTE.   Mike Briggs [id number] DOB: December 03, 1928 DOA: 12/17/2018  PCP: Rosita Fire, MD Patient coming from: Home  I have personally briefly reviewed patient's old medical records in [place]  Chief Complaint: Dysuria  HPI: Mike Briggs is a 85 y.o. male with medical history significant for hypertension, type 2 diabetes mellitus, stage III chronic kidney disease with baseline creatinine 1.3-1.7, chronic bilateral hydroceles, who is admitted to [facility] for overnight observation on 12/17/2018 with acute cystitis after presenting from home to [facility] emergency department complaining of dysuria.  The patient reports 2 to 3 days of dysuria in the absence of any associated gross hematuria or change in urinary urgency/frequency.  He denies any associated subjective fever, chills, rigors, or generalized myalgias.  He acknowledges chronic scrotal swelling in the setting of chronic bilateral hydroceles, without any recent change thereof.  He denies any recent headache, neck stiffness, sore throat, shortness of breath, cough, nausea, vomiting, diarrhea, abdominal pain, or rash.  Denies any new back pain or flank tenderness.  Denies any recent travel or known COVID-19 exposures.  In the setting of known second-degree AV block, Mobitz type I, the patient denies any recent chest pain, palpitations, diaphoresis, shortness of breath, presyncope, or syncope.  He is unsure as to any recent dose adjustments to his outpatient Toprol-XL, which he appears to take 25 mg p.o. QAM, with most recent dose occurring on the morning of 12/17/2018.  Of note, most recent echocardiogram was performed on 11/14/2018 and showed moderate LVH, LVEF 60 to 65%, no focal wall motion normalities, and no significant valvular pathology.  Of note, the patient was hospitalized to [facility] in mid  October 2020 for acute encephalopathy, at which time he was diagnosed with COVID-19.  At the time of my encounter with him today, the patient is alert, and oriented to place, time, self, and situation.  Specifically, he knows that he is at [facility] for a urinary tract infection, he correctly states that current date, correctly recites his date of birth.  He is unsure as to the current [place] president.    ED Course: Vital signs in the emergency department were notable for the following: Temperature max 98.0; baseline heart rate noted to be 50-62, although heart rate noted to intermittently drop into the mid 30s, at which time the patient remained asymptomatic and normotensive; blood pressure ranged from 128/60 to 160/77; respiratory rate 15-18, and oxygen saturation 97 to 100% on room air.  Labs in the ED were notable for the following: CMP notable for creatinine 1.50 relative to most recent prior value of 1.3 on 11/17/2018; CBC notable for white blood cell count of 7300.  Urinalysis was associated with a cloudy specimen and demonstrated greater than 50 white blood cells, large leukocyte esterase, and no squamous epithelial cells.  Urine sample was sent for culture.  In anticipation of admission, nasopharyngeal COVID-19 swab was obtained, with results currently pending.  In the setting of intermittent bradycardia, chest x-ray was obtained, and showed no evidence of acute cardiopulmonary process.  Scrotal ultrasound, in comparison to ultrasound performed in May 2018 showed persistent moderate to large bilateral hydroceles, without any significant interval change, and no evidence of acute process.  EKG showed second-degree AV block, Mobitz type I, with heart rate 42, and no evidence of T wave or ST changes relative to most recent  prior EKG from 11/16/2018.  In the setting of intermittent, transient bradycardia with associated heart rates into the 30s, the patient's case, including EKG, were discussed  with the on-call cardiologist, Dr. Gwenlyn Found.  In the absence of any associated hypotension and given that the patient has remained asymptomatic, Dr. Gwenlyn Found recommended holding home Toprol-XL, with plan for patient to follow-up in outpatient cardiology clinic.  While in the ED this evening, the patient received Rocephin 1 g IV x1 as well as a 500 cc IV normal saline bolus.    Review of Systems: As per HPI otherwise 10 point review of systems negative.   Past Medical History:  Diagnosis Date  . Arthritis   . Cataract   . Chronic kidney disease   . COVID-19   . Depression   . Diabetes mellitus   . Hyperlipidemia   . Hypertension   . Rhabdomyolysis 11/02/2018  . Stroke [facility])     Past Surgical History:  Procedure Laterality Date  . BACK SURGERY     cervical and lumbar  . CATARACT EXTRACTION    . CHOLECYSTECTOMY    . FOOT SURGERY     5th toe   . SPINE SURGERY     lumbar and cervical fusions    Social History:  reports that he quit smoking about 51 years ago. He has never used smokeless tobacco. He reports that he does not drink alcohol or use drugs.   No Known Allergies  Family History  Family history unknown: Yes     Prior to Admission medications   Medication Sig Start Date End Date Taking? Authorizing Provider  aspirin 81 MG chewable tablet Chew 1 tablet (81 mg total) by mouth daily. 11/19/18  Yes Eugenie Filler, MD  atorvastatin (LIPITOR) 10 MG tablet TAKE 1 TABLET BY MOUTH AFTER SUPPER. Patient taking differently: Take 10 mg by mouth every evening.  08/07/17  Yes Hagler, Apolonio Schneiders, MD  colchicine 0.6 MG tablet Take 1 tablet (0.6 mg total) by mouth daily. Patient taking differently: Take 0.6 mg by mouth every morning.  01/10/18  Yes Julianne Rice, MD  lisinopril (ZESTRIL) 40 MG tablet Take 40 mg by mouth daily. 12/08/18  Yes [provider]  memantine (NAMENDA) 10 MG tablet Take 10 mg by mouth 2 (two) times daily.  04/18/17  Yes [provider]   metFORMIN (GLUCOPHAGE) 500 MG tablet Take 1 tablet (500 mg total) by mouth 2 (two) times daily with a meal. 11/10/18 11/10/19 Yes Charlynne Cousins, MD  metoprolol succinate (TOPROL-XL) 25 MG 24 hr tablet TAKE (1) TABLET BY MOUTH ONCE DAILY. Patient taking differently: Take 25 mg by mouth every morning.  05/20/17  Yes Raylene Everts, MD  tamsulosin [facility]) 0.4 MG CAPS capsule Take 1 capsule (0.4 mg total) by mouth daily after breakfast. 02/28/18  Yes Johnson, Clanford L, MD  vitamin C (VITAMIN C) 500 MG tablet Take 1 tablet (500 mg total) by mouth daily. 11/19/18  Yes Eugenie Filler, MD  zinc sulfate 220 (50 Zn) MG capsule Take 1 capsule (220 mg total) by mouth daily. 11/19/18  Yes Eugenie Filler, MD  amLODipine (NORVASC) 10 MG tablet Take 1 tablet (10 mg total) by mouth daily. Patient not taking: Reported on 11/14/2018 11/10/18   Charlynne Cousins, MD     Objective    Physical Exam: Vitals:   12/17/18 1630 12/17/18 1700 12/17/18 1730 12/17/18 1800  BP: (!) 158/84 (!) 151/68 (!) 148/61 (!) 160/77  Pulse: 80 (!) 40 Marland Kitchen)  46 (!) 50  Resp: 16 18 16 17   Temp:      TempSrc:      SpO2: 100% 97% 99% 100%  Weight:      Height:        General: appears to be stated age; alert and oriented, as above. Skin: warm, dry, no rash Head:  AT/[place] Eyes:  PEARL b/l, EOMI Mouth:  Oral mucosa membranes appear moist, normal dentition Neck: supple; trachea midline Heart:  bradycardic; did not appreciate any M/R/G Lungs: CTAB, did not appreciate any wheezes, rales, or rhonchi Abdomen: + BS; soft, ND, NT Extremities: no peripheral edema, no muscle wasting   Labs on Admission: I have personally reviewed following labs and imaging studies  CBC: Recent Labs  Lab 12/17/18 1542  WBC 7.3  NEUTROABS 4.2  HGB 11.2*  HCT 36.9*  MCV 101.9*  PLT [id number]   Basic Metabolic Panel: Recent Labs  Lab 12/17/18 1542  NA 140  K 4.5  CL 110  CO2 21*  GLUCOSE 155*  BUN 29*  CREATININE 1.50*   CALCIUM 8.7*   GFR: Estimated Creatinine Clearance: 39.2 mL/min (A) (by C-G formula based on SCr of 1.5 mg/dL (H)). Liver Function Tests: Recent Labs  Lab 12/17/18 1542  AST 15  ALT 11  ALKPHOS 119  BILITOT 0.6  PROT 6.7  ALBUMIN 3.1*   No results for input(s): LIPASE, AMYLASE in the last 168 hours. No results for input(s): AMMONIA in the last 168 hours. Coagulation Profile: No results for input(s): INR, PROTIME in the last 168 hours. Cardiac Enzymes: No results for input(s): CKTOTAL, CKMB, CKMBINDEX, TROPONINI in the last 168 hours. BNP (last 3 results) No results for input(s): PROBNP in the last 8760 hours. HbA1C: No results for input(s): HGBA1C in the last 72 hours. CBG: No results for input(s): GLUCAP in the last 168 hours. Lipid Profile: No results for input(s): CHOL, HDL, LDLCALC, TRIG, CHOLHDL, LDLDIRECT in the last 72 hours. Thyroid Function Tests: No results for input(s): TSH, T4TOTAL, FREET4, T3FREE, THYROIDAB in the last 72 hours. Anemia Panel: No results for input(s): VITAMINB12, FOLATE, FERRITIN, TIBC, IRON, RETICCTPCT in the last 72 hours. Urine analysis:    Component Value Date/Time   COLORURINE YELLOW 12/17/2018 1509   APPEARANCEUR CLOUDY (A) 12/17/2018 1509   LABSPEC 1.016 12/17/2018 1509   PHURINE 5.0 12/17/2018 [place] 12/17/2018 1509   HGBUR NEGATIVE 12/17/2018 [place] 12/17/2018 1509   KETONESUR NEGATIVE 12/17/2018 1509   PROTEINUR 30 (A) 12/17/2018 1509   UROBILINOGEN 0.2 03/21/2014 1840   NITRITE NEGATIVE 12/17/2018 1509   LEUKOCYTESUR LARGE (A) 12/17/2018 1509    Radiological Exams on Admission: Dg Chest Portable 1 View  Result Date: 12/17/2018 CLINICAL DATA:  Short of breath EXAM: PORTABLE CHEST 1 VIEW COMPARISON:  11/13/2018 FINDINGS: Mildly low lung volume. No acute consolidation or effusion. Mild cardiomegaly. No pneumothorax. IMPRESSION: Mild cardiomegaly.  No acute airspace opacity Electronically  Signed   By: Donavan Foil M.D.   On: 12/17/2018 17:59   Dg Knee Complete 4 Views Left  Result Date: 12/17/2018 CLINICAL DATA:  Bilateral knee pain EXAM: LEFT KNEE - COMPLETE 4+ VIEW COMPARISON:  None. FINDINGS: No fracture or malalignment. Trace knee effusion. Mild joint space calcifications. IMPRESSION: 1. No acute osseous abnormality 2. Trace knee effusion. Faint joint space calcifications suggesting mild chondrocalcinosis Electronically Signed   By: Donavan Foil M.D.   On: 12/17/2018 18:00   Dg Knee Complete 4 Views Right  Result Date: 12/17/2018 CLINICAL DATA:  Bilateral knee pain EXAM: RIGHT KNEE - COMPLETE 4+ VIEW COMPARISON:  None. FINDINGS: No fracture or malalignment. Mild tricompartment arthritis. Prominent joint space calcifications. Possible soft tissue defect in the suprapatellar region IMPRESSION: 1. No acute osseous abnormality.  Mild arthritis.  Chondrocalcinosis 2. Possible superficial soft tissue defect at the suprapatellar knee, correlate with physical examination Electronically Signed   By: Donavan Foil M.D.   On: 12/17/2018 18:01   US Scrotum W/doppler  Result Date: 12/17/2018 CLINICAL DATA:  Scrotal swelling and tenderness. EXAM: SCROTAL ULTRASOUND DOPPLER ULTRASOUND OF THE TESTICLES TECHNIQUE: Complete ultrasound examination of the testicles, epididymis, and other scrotal structures was performed. Color and spectral Doppler ultrasound were also utilized to evaluate blood flow to the testicles. COMPARISON:  06/19/2016 FINDINGS: Right testicle Measurements: 3.8 x 2.7 x 2.4 cm. No mass or microlithiasis visualized. Left testicle Measurements: 3.7 x 3.0 x 2.5 cm. No mass or microlithiasis visualized. Right epididymis:  Normal in size and appearance. Left epididymis:  Normal in size and appearance. Hydrocele: Persistent moderate to large bilateral hydroceles, right larger than left. Varicocele:  None visualized. Pulsed Doppler interrogation of both testes demonstrates normal low  resistance arterial and venous waveforms bilaterally. IMPRESSION: 1. Persistent moderate to large bilateral hydroceles. 2. Otherwise unremarkable examination. Electronically Signed   By: Logan Bores M.D.   On: 12/17/2018 17:00    EKG: Independently reviewed, with results as detailed above.   Assessment/Plan   Mike Briggs is a 85 y.o. male with medical history significant for hypertension, type 2 diabetes mellitus, stage III chronic kidney disease with baseline creatinine 1.3-1.7, chronic bilateral hydroceles, who is admitted to [facility] for overnight observation on 12/17/2018 with acute cystitis after presenting from home to [facility] emergency department complaining of dysuria.   Principal Problem:   Acute cystitis Active Problems:   Type 2 diabetes mellitus with diabetic chronic kidney disease (HCC)   Chronic kidney disease, stage III (moderate) (HCC)   Essential hypertension   HLD (hyperlipidemia)   Mobitz type 1 second degree atrioventricular block  #) Acute cystitis: Diagnosis on the basis of presenting 2 to 3 days of dysuria with urinalysis suggestive of underlying UTI in the context of significant pyuria, as further detailed above.  Presentation does not meet criteria for sepsis as no sirs criteria are present at this time.  No evidence of associated hypotension.  By definition, on the basis of patient's gender, UTI will be considered complicated in nature, requiring 10 to 14 days of antibiotic coverage.  Received dose of Rocephin in the ED this evening.  Plan: Continue Rocephin, and monitor closely for result of urine culture/sensitivity.  We will also check blood cultures x2 now.  Repeat CBC with differential in the morning.    #) Mobitz type I, second-degree AV block: Patient has a known history of such, with EKG's from most recent prior hospitalization consistent with this finding.  Over the course of his time in the ED this evening, it appears that his baseline heart  rate is in the 50s to 60s, although heart rate does intermittently drop into the 30s in the absence of any associated hypotension.  Additionally, patient remains completely asymptomatic at times of these runs of bradycardia.  Apparent iatrogenic contribution from home Toprol-XL 25 mg p.o. every morning, with most recent dose occurring on the morning of 12/17/2018.  Of note, most recent echocardiogram performed on 11/14/2018, showed no evidence of significant structural abnormality, as further described above.  Patient's case was discussed with the on-call cardiologist, Dr. Gwenlyn Found, who, in the context of hemodynamic stability and the patient remaining completely asymptomatic, recommended holding of outpatient beta-blocker and outpatient follow-up in cardiology clinic.  Plan: Hold home Toprol-XL.  Will monitor on telemetry overnight.  Check serum magnesium level.  Repeat BMP in the morning.  Outpatient cardiology follow-up, as above.  We will also check TSH.     #) Essential hypertension: On lisinopril as well as Toprol XL as an outpatient.  Additionally, potential antihypertensive implications from home Flomax.  While in the ED, systolic blood pressures have ranged in the 120s to 160s, in the absence of any associated hypotension.   Plan: Hold home Toprol-XL in the context of bradycardia, as further described above.  Continue home lisinopril.  Monitor strict I's and O's and repeat BMP in the morning.     #) Hyperlipidemia: On atorvastatin as an outpatient.  Plan: Continue home statin.      #) Type 2 diabetes mellitus: Non-exogenous insulin dependent as an outpatient.  Is on metformin as a sole outpatient oral hypoglycemic agent.  Blood sugar per presenting CMP noted to be 155.  Plan: We will hold home metformin during this hospitalization.  Accu-Cheks q. before meals and at bedtime with low-dose sliding scale insulin.     #) Stage III chronic kidney disease: Associated with baseline  creatinine of 1.3-1.7, with presenting creatinine found to be within his range, with specific data point noted to be 1.50.  Appears to be on the basis of diabetic nephropathy.  Plan: Monitor strict I's and O's and daily weights.  Attempt to avoid nephrotoxic agents.  Repeat BMP in the morning.     #) BPH: On Flomax as an outpatient.  Plan: We will continue home Flomax, monitor strict I's and O's..  Repeat BMP in the morning.   DVT prophylaxis: Lovenox 40 mg subcu daily. Code Status: Full code Family Communication: None Disposition Plan:  Per Rounding Team Consults called: on-call cardiology (Dr. Gwenlyn Found), as described above.  Admission status: Observation; med telemetry.   PLEASE NOTE THAT DRAGON DICTATION SOFTWARE WAS USED IN THE CONSTRUCTION OF THIS NOTE.   [facility] Pager [phone] From 3PM- 11PM.   Otherwise, please contact night-coverage  [email or web address] Password [email or web address]  12/17/2018, 9:05 PM

## 2018-12-17 NOTE — ED Triage Notes (Signed)
PT brought in from home today with CNA sitter. EMS got called multiple times today for falls. CNA states noticing new testicular swelling today. PT had outpatient signed order for an xray and that was obtained prior to arriving to ED now.

## 2018-12-17 NOTE — ED Provider Notes (Signed)
[facility] EMERGENCY DEPARTMENT Provider Note   CSN: [id number] Arrival date & time: 12/17/18  1348     History   Chief Complaint Chief Complaint  Patient presents with  . Groin Swelling    HPI Mike Briggs is a 85 y.o. male with history of stroke, hypertension, diabetes who presents with scrotal swelling over the past few days.  Denies any dysuria or frequency penile discharge.  Patient reports he always has some constipation issues.  He denies any abdominal pain, nausea, vomiting, fevers.     HPI  Past Medical History:  Diagnosis Date  . Arthritis   . Cataract   . Chronic kidney disease   . COVID-19   . Depression   . Diabetes mellitus   . Hyperlipidemia   . Hypertension   . Rhabdomyolysis 11/02/2018  . Stroke [facility])     Patient Active Problem List   Diagnosis Date Noted  . Acute cystitis 12/17/2018  . Mobitz type 1 second degree atrioventricular block   . TIA (transient ischemic attack) 11/14/2018  . Acute encephalopathy 11/14/2018  . Sacral decubitus ulcer, stage III ([place]) 11/11/2018  . Pressure injury of skin 11/08/2018  . COVID-19 virus infection 11/03/2018  . COVID-19 virus detected 11/03/2018  . Gram-positive bacteremia 11/03/2018  . Dehydration 11/02/2018  . Rhabdomyolysis 11/02/2018  . UTI (urinary tract infection) 02/26/2018  . Syncope 02/25/2018  . Vitamin D deficiency 05/31/2016  . Essential hypertension 05/22/2016  . Hemiparesis affecting right side as late effect of cerebrovascular accident (CVA) ([place]) 05/22/2016  . Personal history of gout 05/22/2016  . BPH (benign prostatic hyperplasia) 05/22/2016  . Chronic constipation 05/22/2016  . HLD (hyperlipidemia) 05/22/2016  . Pseudophakia 12/06/2015  . Vascular dementia ([place]) 10/23/2012  . Chronic kidney disease, stage III (moderate) ([place]) 10/20/2012  . Type 2 diabetes mellitus with diabetic chronic kidney disease ([place]) 12/16/2008  . ANKLE, ARTHRITIS, DEGEN./OSTEO 12/16/2008    Past  Surgical History:  Procedure Laterality Date  . BACK SURGERY     cervical and lumbar  . CATARACT EXTRACTION    . CHOLECYSTECTOMY    . FOOT SURGERY     5th toe   . SPINE SURGERY     lumbar and cervical fusions        Home Medications    Prior to Admission medications   Medication Sig Start Date End Date Taking? Authorizing Provider  aspirin 81 MG chewable tablet Chew 1 tablet (81 mg total) by mouth daily. 11/19/18  Yes Eugenie Filler, MD  atorvastatin (LIPITOR) 10 MG tablet TAKE 1 TABLET BY MOUTH AFTER SUPPER. Patient taking differently: Take 10 mg by mouth every evening.  08/07/17  Yes Hagler, Apolonio Schneiders, MD  colchicine 0.6 MG tablet Take 1 tablet (0.6 mg total) by mouth daily. Patient taking differently: Take 0.6 mg by mouth every morning.  01/10/18  Yes Julianne Rice, MD  lisinopril (ZESTRIL) 40 MG tablet Take 40 mg by mouth daily. 12/08/18  Yes [provider]  memantine (NAMENDA) 10 MG tablet Take 10 mg by mouth 2 (two) times daily.  04/18/17  Yes [provider]  metFORMIN (GLUCOPHAGE) 500 MG tablet Take 1 tablet (500 mg total) by mouth 2 (two) times daily with a meal. 11/10/18 11/10/19 Yes Charlynne Cousins, MD  metoprolol succinate (TOPROL-XL) 25 MG 24 hr tablet TAKE (1) TABLET BY MOUTH ONCE DAILY. Patient taking differently: Take 25 mg by mouth every morning.  05/20/17  Yes Raylene Everts, MD  tamsulosin (FLOMAX) 0.4 MG CAPS  capsule Take 1 capsule (0.4 mg total) by mouth daily after breakfast. 02/28/18  Yes Johnson, Clanford L, MD  vitamin C (VITAMIN C) 500 MG tablet Take 1 tablet (500 mg total) by mouth daily. 11/19/18  Yes Eugenie Filler, MD  zinc sulfate 220 (50 Zn) MG capsule Take 1 capsule (220 mg total) by mouth daily. 11/19/18  Yes Eugenie Filler, MD  amLODipine (NORVASC) 10 MG tablet Take 1 tablet (10 mg total) by mouth daily. Patient not taking: Reported on 11/14/2018 11/10/18   Charlynne Cousins, MD    Family History Family  History  Family history unknown: Yes    Social History Social History   Tobacco Use  . Smoking status: Former Smoker    Quit date: 08/07/1967    Years since quitting: 51.3  . Smokeless tobacco: Never Used  Substance Use Topics  . Alcohol use: No  . Drug use: No     Allergies   Patient has no known allergies.   Review of Systems Review of Systems  Constitutional: Negative for chills and fever.  HENT: Negative for facial swelling and sore throat.   Respiratory: Negative for shortness of breath.   Cardiovascular: Negative for chest pain.  Gastrointestinal: Positive for constipation (baseline). Negative for abdominal pain, nausea and vomiting.  Genitourinary: Positive for scrotal swelling and testicular pain (only on palpation). Negative for dysuria.  Musculoskeletal: Negative for back pain.  Skin: Negative for rash and wound.  Neurological: Negative for headaches.  Psychiatric/Behavioral: The patient is not nervous/anxious.      Physical Exam Updated Vital Signs BP (!) 160/77   Pulse (!) 50   Temp 98 F (36.7 C) (Oral)   Resp 17   Ht 5\' 11"  (1.803 m)   Wt 90.7 kg   SpO2 100%   BMI 27.89 kg/m   Physical Exam Vitals signs and nursing note reviewed. Exam conducted with a chaperone present.  Constitutional:      General: He is not in acute distress.    Appearance: He is well-developed. He is not diaphoretic.  HENT:     Head: Normocephalic and atraumatic.     Mouth/Throat:     Pharynx: No oropharyngeal exudate.  Eyes:     General: No scleral icterus.       Right eye: No discharge.        Left eye: No discharge.     Conjunctiva/sclera: Conjunctivae normal.     Pupils: Pupils are equal, round, and reactive to light.  Neck:     Musculoskeletal: Normal range of motion and neck supple.     Thyroid: No thyromegaly.  Cardiovascular:     Rate and Rhythm: Normal rate and regular rhythm.     Heart sounds: Normal heart sounds. No murmur. No friction rub. No gallop.    Pulmonary:     Effort: Pulmonary effort is normal. No respiratory distress.     Breath sounds: Normal breath sounds. No stridor. No wheezing or rales.  Abdominal:     General: Bowel sounds are normal. There is no distension.     Palpations: Abdomen is soft.     Tenderness: There is no abdominal tenderness. There is no guarding or rebound.  Genitourinary:    Scrotum/Testes:        Right: Tenderness present.        Left: Tenderness present.     Comments: Scrotal swelling bilaterally Lymphadenopathy:     Cervical: No cervical adenopathy.  Skin:    General: Skin is  warm and dry.     Coloration: Skin is not pale.     Findings: No rash.  Neurological:     Mental Status: He is alert.     Coordination: Coordination normal.      ED Treatments / Results  Labs (all labs ordered are listed, but only abnormal results are displayed) Labs Reviewed  URINALYSIS, ROUTINE W REFLEX MICROSCOPIC - Abnormal; Notable for the following components:      Result Value   APPearance CLOUDY (*)    Protein, ur 30 (*)    Leukocytes,Ua LARGE (*)    WBC, UA >50 (*)    Bacteria, UA FEW (*)    All other components within normal limits  COMPREHENSIVE METABOLIC PANEL - Abnormal; Notable for the following components:   CO2 21 (*)    Glucose, Bld 155 (*)    BUN 29 (*)    Creatinine, Ser 1.50 (*)    Calcium 8.7 (*)    Albumin 3.1 (*)    GFR calc non Af Amer 41 (*)    GFR calc Af Amer 47 (*)    All other components within normal limits  CBC WITH DIFFERENTIAL/PLATELET - Abnormal; Notable for the following components:   RBC 3.62 (*)    Hemoglobin 11.2 (*)    HCT 36.9 (*)    MCV 101.9 (*)    All other components within normal limits  URINE CULTURE  SARS CORONAVIRUS 2 (TAT 6-24 HRS)    EKG EKG Interpretation  Date/Time:  Wednesday December 17 2018 17:27:12 EST Ventricular Rate:  42 PR Interval:    QRS Duration: 81 QT Interval:  438 QTC Calculation: 366 R Axis:   -2 Text Interpretation: Sinus  rhythm Supraventricular bigeminy Prolonged PR interval Low voltage, precordial leads Abnormal R-wave progression, early transition type 1 2nd degree AV block Confirmed by Ezequiel Essex [phone]) on 12/17/2018 5:34:20 PM   Radiology Dg Chest Portable 1 View  Result Date: 12/17/2018 CLINICAL DATA:  Short of breath EXAM: PORTABLE CHEST 1 VIEW COMPARISON:  11/13/2018 FINDINGS: Mildly low lung volume. No acute consolidation or effusion. Mild cardiomegaly. No pneumothorax. IMPRESSION: Mild cardiomegaly.  No acute airspace opacity Electronically Signed   By: Donavan Foil M.D.   On: 12/17/2018 17:59   Dg Knee Complete 4 Views Left  Result Date: 12/17/2018 CLINICAL DATA:  Bilateral knee pain EXAM: LEFT KNEE - COMPLETE 4+ VIEW COMPARISON:  None. FINDINGS: No fracture or malalignment. Trace knee effusion. Mild joint space calcifications. IMPRESSION: 1. No acute osseous abnormality 2. Trace knee effusion. Faint joint space calcifications suggesting mild chondrocalcinosis Electronically Signed   By: Donavan Foil M.D.   On: 12/17/2018 18:00   Dg Knee Complete 4 Views Right  Result Date: 12/17/2018 CLINICAL DATA:  Bilateral knee pain EXAM: RIGHT KNEE - COMPLETE 4+ VIEW COMPARISON:  None. FINDINGS: No fracture or malalignment. Mild tricompartment arthritis. Prominent joint space calcifications. Possible soft tissue defect in the suprapatellar region IMPRESSION: 1. No acute osseous abnormality.  Mild arthritis.  Chondrocalcinosis 2. Possible superficial soft tissue defect at the suprapatellar knee, correlate with physical examination Electronically Signed   By: Donavan Foil M.D.   On: 12/17/2018 18:01   US Scrotum W/doppler  Result Date: 12/17/2018 CLINICAL DATA:  Scrotal swelling and tenderness. EXAM: SCROTAL ULTRASOUND DOPPLER ULTRASOUND OF THE TESTICLES TECHNIQUE: Complete ultrasound examination of the testicles, epididymis, and other scrotal structures was performed. Color and spectral Doppler  ultrasound were also utilized to evaluate blood flow to the testicles. COMPARISON:  06/19/2016 FINDINGS: Right testicle Measurements: 3.8 x 2.7 x 2.4 cm. No mass or microlithiasis visualized. Left testicle Measurements: 3.7 x 3.0 x 2.5 cm. No mass or microlithiasis visualized. Right epididymis:  Normal in size and appearance. Left epididymis:  Normal in size and appearance. Hydrocele: Persistent moderate to large bilateral hydroceles, right larger than left. Varicocele:  None visualized. Pulsed Doppler interrogation of both testes demonstrates normal low resistance arterial and venous waveforms bilaterally. IMPRESSION: 1. Persistent moderate to large bilateral hydroceles. 2. Otherwise unremarkable examination. Electronically Signed   By: Logan Bores M.D.   On: 12/17/2018 17:00    Procedures Procedures (including critical care time)  Medications Ordered in ED Medications  sodium chloride 0.9 % bolus 500 mL (has no administration in time range)  cefTRIAXone (ROCEPHIN) 1 g in sodium chloride 0.9 % 100 mL IVPB (has no administration in time range)     Initial Impression / Assessment and Plan / ED Course  I have reviewed the triage vital signs and the nursing notes.  Pertinent labs & imaging results that were available during my care of the patient were reviewed by me and considered in my medical decision making (see chart for details).        Patient presenting with groin swelling and found to have persistent moderate to large bilateral hydroceles, but otherwise no other explanation for symptoms.  Patient found to have several episodes of bradycardia down to the upper 20s, low 30s.  I discussed this with Dr. Gwenlyn Found with cardiologist on-call, who advised that patient was not symptomatic, he could stop metoprolol and follow-up in the office.  Patient does have a known 2:1 heart block.  However, patient is found to have UTI and mild AKI.  He lives alone and I feel observation admission to monitor and  reconcile his medications would be beneficial.  Patient is agreeable with plan.  I discussed patient case with Dr. Velia Meyer with [facility] who accepts patient for admission.  I appreciate his assistance with the patient.  Patient also guided by my attending, Dr. Wyvonnia Dusky, who guided the patient's management and agrees with plan.  Final Clinical Impressions(s) / ED Diagnoses   Final diagnoses:  Bradycardia  Acute lower UTI  AKI (acute kidney injury) [facility])    ED Discharge Orders    None       Frederica Kuster, Hershal Coria 12/17/18 2109    Ezequiel Essex, MD 12/18/18 1206

## 2018-12-17 NOTE — ED Notes (Signed)
Patient appears confused as he does not know why he is here or who called EMS.

## 2018-12-18 DIAGNOSIS — E785 Hyperlipidemia, unspecified: Secondary | ICD-10-CM | POA: Diagnosis present

## 2018-12-18 DIAGNOSIS — Z79899 Other long term (current) drug therapy: Secondary | ICD-10-CM | POA: Diagnosis not present

## 2018-12-18 DIAGNOSIS — Z87440 Personal history of urinary (tract) infections: Secondary | ICD-10-CM | POA: Diagnosis not present

## 2018-12-18 DIAGNOSIS — Z87891 Personal history of nicotine dependence: Secondary | ICD-10-CM | POA: Diagnosis not present

## 2018-12-18 DIAGNOSIS — F0150 Vascular dementia without behavioral disturbance: Secondary | ICD-10-CM | POA: Diagnosis not present

## 2018-12-18 DIAGNOSIS — G9340 Encephalopathy, unspecified: Secondary | ICD-10-CM | POA: Diagnosis not present

## 2018-12-18 DIAGNOSIS — Z7902 Long term (current) use of antithrombotics/antiplatelets: Secondary | ICD-10-CM | POA: Diagnosis not present

## 2018-12-18 DIAGNOSIS — B961 Klebsiella pneumoniae [K. pneumoniae] as the cause of diseases classified elsewhere: Secondary | ICD-10-CM | POA: Diagnosis present

## 2018-12-18 DIAGNOSIS — J9811 Atelectasis: Secondary | ICD-10-CM | POA: Diagnosis not present

## 2018-12-18 DIAGNOSIS — E1122 Type 2 diabetes mellitus with diabetic chronic kidney disease: Secondary | ICD-10-CM | POA: Diagnosis not present

## 2018-12-18 DIAGNOSIS — Z87448 Personal history of other diseases of urinary system: Secondary | ICD-10-CM | POA: Diagnosis not present

## 2018-12-18 DIAGNOSIS — Z8673 Personal history of transient ischemic attack (TIA), and cerebral infarction without residual deficits: Secondary | ICD-10-CM | POA: Diagnosis not present

## 2018-12-18 DIAGNOSIS — N400 Enlarged prostate without lower urinary tract symptoms: Secondary | ICD-10-CM | POA: Diagnosis present

## 2018-12-18 DIAGNOSIS — N432 Other hydrocele: Secondary | ICD-10-CM | POA: Diagnosis present

## 2018-12-18 DIAGNOSIS — E860 Dehydration: Secondary | ICD-10-CM | POA: Diagnosis not present

## 2018-12-18 DIAGNOSIS — J90 Pleural effusion, not elsewhere classified: Secondary | ICD-10-CM | POA: Diagnosis not present

## 2018-12-18 DIAGNOSIS — I69398 Other sequelae of cerebral infarction: Secondary | ICD-10-CM | POA: Diagnosis not present

## 2018-12-18 DIAGNOSIS — N179 Acute kidney failure, unspecified: Secondary | ICD-10-CM | POA: Diagnosis present

## 2018-12-18 DIAGNOSIS — Z981 Arthrodesis status: Secondary | ICD-10-CM | POA: Diagnosis not present

## 2018-12-18 DIAGNOSIS — I69392 Facial weakness following cerebral infarction: Secondary | ICD-10-CM | POA: Diagnosis not present

## 2018-12-18 DIAGNOSIS — I69351 Hemiplegia and hemiparesis following cerebral infarction affecting right dominant side: Secondary | ICD-10-CM | POA: Diagnosis not present

## 2018-12-18 DIAGNOSIS — Z7982 Long term (current) use of aspirin: Secondary | ICD-10-CM | POA: Diagnosis not present

## 2018-12-18 DIAGNOSIS — Z7984 Long term (current) use of oral hypoglycemic drugs: Secondary | ICD-10-CM | POA: Diagnosis not present

## 2018-12-18 DIAGNOSIS — Z8619 Personal history of other infectious and parasitic diseases: Secondary | ICD-10-CM | POA: Diagnosis not present

## 2018-12-18 DIAGNOSIS — Z9181 History of falling: Secondary | ICD-10-CM | POA: Diagnosis not present

## 2018-12-18 DIAGNOSIS — G319 Degenerative disease of nervous system, unspecified: Secondary | ICD-10-CM | POA: Diagnosis not present

## 2018-12-18 DIAGNOSIS — Z7401 Bed confinement status: Secondary | ICD-10-CM | POA: Diagnosis not present

## 2018-12-18 DIAGNOSIS — N3000 Acute cystitis without hematuria: Secondary | ICD-10-CM | POA: Diagnosis not present

## 2018-12-18 DIAGNOSIS — R29898 Other symptoms and signs involving the musculoskeletal system: Secondary | ICD-10-CM | POA: Diagnosis not present

## 2018-12-18 DIAGNOSIS — E119 Type 2 diabetes mellitus without complications: Secondary | ICD-10-CM | POA: Diagnosis present

## 2018-12-18 DIAGNOSIS — M109 Gout, unspecified: Secondary | ICD-10-CM | POA: Diagnosis present

## 2018-12-18 DIAGNOSIS — I1310 Hypertensive heart and chronic kidney disease without heart failure, with stage 1 through stage 4 chronic kidney disease, or unspecified chronic kidney disease: Secondary | ICD-10-CM | POA: Diagnosis not present

## 2018-12-18 DIAGNOSIS — Z20828 Contact with and (suspected) exposure to other viral communicable diseases: Secondary | ICD-10-CM | POA: Diagnosis present

## 2018-12-18 DIAGNOSIS — N183 Chronic kidney disease, stage 3 unspecified: Secondary | ICD-10-CM | POA: Diagnosis not present

## 2018-12-18 DIAGNOSIS — U071 COVID-19: Secondary | ICD-10-CM | POA: Diagnosis not present

## 2018-12-18 DIAGNOSIS — I441 Atrioventricular block, second degree: Secondary | ICD-10-CM | POA: Diagnosis present

## 2018-12-18 DIAGNOSIS — M6282 Rhabdomyolysis: Secondary | ICD-10-CM | POA: Diagnosis not present

## 2018-12-18 DIAGNOSIS — I10 Essential (primary) hypertension: Secondary | ICD-10-CM | POA: Diagnosis present

## 2018-12-18 LAB — BASIC METABOLIC PANEL
Anion gap: 7 (ref 5–15)
BUN: 25 mg/dL — ABNORMAL HIGH (ref 8–23)
CO2: 20 mmol/L — ABNORMAL LOW (ref 22–32)
Calcium: 8.5 mg/dL — ABNORMAL LOW (ref 8.9–10.3)
Chloride: 110 mmol/L (ref 98–111)
Creatinine, Ser: 1.25 mg/dL — ABNORMAL HIGH (ref 0.61–1.24)
GFR calc Af Amer: 59 mL/min — ABNORMAL LOW (ref >60)
GFR calc non Af Amer: 51 mL/min — ABNORMAL LOW (ref >60)
Glucose, Bld: 125 mg/dL — ABNORMAL HIGH (ref 70–99)
Potassium: 4.4 mmol/L (ref 3.5–5.1)
Sodium: 137 mmol/L (ref 135–145)

## 2018-12-18 LAB — CBC
HCT: 39.6 % (ref 39.0–52.0)
Hemoglobin: 12 g/dL — ABNORMAL LOW (ref 13.0–17.0)
MCH: 30.9 pg (ref 26.0–34.0)
MCHC: 30.3 g/dL (ref 30.0–36.0)
MCV: 102.1 fL — ABNORMAL HIGH (ref 80.0–100.0)
Platelets: 181 10*3/uL (ref 150–400)
RBC: 3.88 MIL/uL — ABNORMAL LOW (ref 4.22–5.81)
RDW: 13.2 % (ref 11.5–15.5)
WBC: 8 10*3/uL (ref 4.0–10.5)
nRBC: 0 % (ref 0.0–0.2)

## 2018-12-18 LAB — GLUCOSE, CAPILLARY
Glucose-Capillary: 157 mg/dL — ABNORMAL HIGH (ref 70–99)
Glucose-Capillary: 144 mg/dL — ABNORMAL HIGH (ref 70–99)
Glucose-Capillary: 182 mg/dL — ABNORMAL HIGH (ref 70–99)

## 2018-12-18 LAB — MAGNESIUM: Magnesium: 1.3 mg/dL — ABNORMAL LOW (ref 1.7–2.4)

## 2018-12-18 LAB — SARS CORONAVIRUS 2 (TAT 6-24 HRS): SARS Coronavirus 2: POSITIVE — AB

## 2018-12-18 LAB — CBG MONITORING, ED: Glucose-Capillary: 123 mg/dL — ABNORMAL HIGH (ref 70–99)

## 2018-12-18 LAB — TSH: TSH: 0.849 u[IU]/mL (ref 0.350–4.500)

## 2018-12-18 MED ORDER — ASPIRIN 81 MG PO CHEW
81.0000 mg | CHEWABLE_TABLET | Freq: Every day | ORAL | Status: DC
  Administered 2018-12-18 – 2018-12-20 (×3): 81 mg via ORAL
  Filled 2018-12-18 (×3): qty 1

## 2018-12-18 MED ORDER — TAMSULOSIN HCL 0.4 MG PO CAPS
0.4000 mg | ORAL_CAPSULE | Freq: Every day | ORAL | Status: DC
  Administered 2018-12-18 – 2018-12-20 (×3): 0.4 mg via ORAL
  Filled 2018-12-18 (×3): qty 1

## 2018-12-18 MED ORDER — ACETAMINOPHEN 650 MG RE SUPP: 650.0000 mg | Freq: Four times a day (QID) | RECTAL | Status: DC | PRN

## 2018-12-18 MED ORDER — ENOXAPARIN SODIUM 40 MG/0.4ML ~~LOC~~ SOLN
40.0000 mg | SUBCUTANEOUS | Status: DC
  Administered 2018-12-18 – 2018-12-20 (×3): 40 mg via SUBCUTANEOUS
  Filled 2018-12-18 (×3): qty 0.4

## 2018-12-18 MED ORDER — MEMANTINE HCL 10 MG PO TABS
10.0000 mg | ORAL_TABLET | Freq: Two times a day (BID) | ORAL | Status: DC
  Administered 2018-12-18 – 2018-12-20 (×5): 10 mg via ORAL
  Filled 2018-12-18 (×8): qty 1

## 2018-12-18 MED ORDER — COLCHICINE 0.6 MG PO TABS
0.6000 mg | ORAL_TABLET | Freq: Every morning | ORAL | Status: DC
  Administered 2018-12-18 – 2018-12-20 (×3): 0.6 mg via ORAL
  Filled 2018-12-18 (×3): qty 1

## 2018-12-18 MED ORDER — SODIUM CHLORIDE 0.9 % IV SOLN
1.0000 g | INTRAVENOUS | Status: DC
  Administered 2018-12-18 – 2018-12-19 (×2): 1 g via INTRAVENOUS
  Filled 2018-12-18 (×2): qty 10

## 2018-12-18 MED ORDER — MAGNESIUM SULFATE 2 GM/50ML IV SOLN
2.0000 g | Freq: Once | INTRAVENOUS | Status: AC
  Administered 2018-12-18: 13:00:00 2 g via INTRAVENOUS
  Filled 2018-12-18: qty 50

## 2018-12-18 MED ORDER — ACETAMINOPHEN 325 MG PO TABS: 650.0000 mg | ORAL_TABLET | Freq: Four times a day (QID) | ORAL | Status: DC | PRN

## 2018-12-18 MED ORDER — MAGNESIUM SULFATE 2 GM/50ML IV SOLN
2.0000 g | Freq: Once | INTRAVENOUS | Status: DC
  Filled 2018-12-18: qty 50

## 2018-12-18 MED ORDER — ATORVASTATIN CALCIUM 10 MG PO TABS
10.0000 mg | ORAL_TABLET | Freq: Every evening | ORAL | Status: DC
  Administered 2018-12-18 – 2018-12-19 (×2): 10 mg via ORAL
  Filled 2018-12-18 (×2): qty 1

## 2018-12-18 MED ORDER — INSULIN ASPART 100 UNIT/ML ~~LOC~~ SOLN
0.0000 [IU] | Freq: Three times a day (TID) | SUBCUTANEOUS | Status: DC
  Administered 2018-12-18: 2 [IU] via SUBCUTANEOUS
  Administered 2018-12-18 (×2): 1 [IU] via SUBCUTANEOUS
  Administered 2018-12-19 (×2): 2 [IU] via SUBCUTANEOUS
  Administered 2018-12-19 – 2018-12-20 (×2): 1 [IU] via SUBCUTANEOUS
  Administered 2018-12-20: 2 [IU] via SUBCUTANEOUS

## 2018-12-18 MED ORDER — SODIUM CHLORIDE 0.9% FLUSH
3.0000 mL | Freq: Two times a day (BID) | INTRAVENOUS | Status: DC
  Administered 2018-12-18 – 2018-12-20 (×5): 3 mL via INTRAVENOUS

## 2018-12-18 MED ORDER — LISINOPRIL 10 MG PO TABS
40.0000 mg | ORAL_TABLET | Freq: Every day | ORAL | Status: DC
  Administered 2018-12-18 – 2018-12-20 (×3): 40 mg via ORAL
  Filled 2018-12-18 (×3): qty 4

## 2018-12-18 NOTE — Progress Notes (Signed)
PROGRESS NOTE    FAISON OPHEIM  [id number] DOB: 12/28/1928 DOA: 12/17/2018 PCP: Rosita Fire, MD   Brief Narrative:  Per HPI: Mike Briggs is a 85 y.o. male with medical history significant for hypertension, type 2 diabetes mellitus, stage III chronic kidney disease with baseline creatinine 1.3-1.7, chronic bilateral hydroceles, who is admitted to [facility] for overnight observation on 12/17/2018 with acute cystitis after presenting from home to [facility] emergency department complaining of dysuria.  The patient reports 2 to 3 days of dysuria in the absence of any associated gross hematuria or change in urinary urgency/frequency.  He denies any associated subjective fever, chills, rigors, or generalized myalgias.  He acknowledges chronic scrotal swelling in the setting of chronic bilateral hydroceles, without any recent change thereof.  He denies any recent headache, neck stiffness, sore throat, shortness of breath, cough, nausea, vomiting, diarrhea, abdominal pain, or rash.  Denies any new back pain or flank tenderness.  Denies any recent travel or known COVID-19 exposures.  In the setting of known second-degree AV block, Mobitz type I, the patient denies any recent chest pain, palpitations, diaphoresis, shortness of breath, presyncope, or syncope.  He is unsure as to any recent dose adjustments to his outpatient Toprol-XL, which he appears to take 25 mg p.o. QAM, with most recent dose occurring on the morning of 12/17/2018.  Of note, most recent echocardiogram was performed on 11/14/2018 and showed moderate LVH, LVEF 60 to 65%, no focal wall motion normalities, and no significant valvular pathology.  Of note, the patient was hospitalized to [facility] in mid October 2020 for acute encephalopathy, at which time he was diagnosed with COVID-19.  At the time of my encounter with him today, the patient is alert, and oriented to place, time, self, and situation.  Specifically,  he knows that he is at [facility] for a urinary tract infection, he correctly states that current date, correctly recites his date of birth.  He is unsure as to the current [place] president.   11/19: Patient was admitted with UTI and started on Rocephin empirically.  Urine culture still pending.  He states that he did not actually have any dysuria and states that he fell at home.  Will order PT consultation.  Covid studies currently pending.  Magnesium noted to be quite low and did not appear to be repleted.  Will order repletion today and recheck in a.m.  Assessment & Plan:   Principal Problem:   Acute cystitis Active Problems:   Type 2 diabetes mellitus with diabetic chronic kidney disease (H[facility])   Chronic kidney disease, stage III (moderate) ([facility])   Essential hypertension   HLD (hyperlipidemia)   UTI (urinary tract infection)   Mobitz type 1 second degree atrioventricular block   Acute cystitis -Continue empiric Rocephin and monitor urine cultures -Recheck a.m. labs  Hypomagnesemia -Replete and reevaluate in a.m.  Mobitz type I second-degree AV block with known prior history -Some bradycardia noted and plans to hold Toprol-XL per cardiology recommendations -TSH noted to be 0.849  Essential hypertension -Continue home lisinopril and hold Toprol-XL until further outpatient follow-up in cardiology clinic  Dyslipidemia -Continue home statin  Type 2 diabetes -Continue SSI  Stage III chronic kidney disease -Currently stable continue to monitor  BPH with bilateral chronic hydrocele -Continue Flomax -Scrotal ultrasound without any acute findings   DVT prophylaxis: Lovenox Code Status: Full Family Communication: None at bedside Disposition Plan: Continue on current IV antibiotics.  PT evaluation ordered and  pending.  Follow urine cultures.   Consultants:   None  Procedures:   None  Antimicrobials:  Anti-infectives (From admission, onward)   Start      Dose/Rate Route Frequency Ordered Stop   12/18/18 2100  cefTRIAXone (ROCEPHIN) 1 g in sodium chloride 0.9 % 100 mL IVPB     1 g 200 mL/hr over 30 Minutes Intravenous Every 24 hours 12/18/18 0015     12/17/18 2100  cefTRIAXone (ROCEPHIN) 1 g in sodium chloride 0.9 % 100 mL IVPB     1 g 200 mL/hr over 30 Minutes Intravenous  Once 12/17/18 2051 12/17/18 2307       Subjective: Patient seen and evaluated today with no new acute complaints or concerns. No acute concerns or events noted overnight.  Objective: Vitals:   12/18/18 0600 12/18/18 0800 12/18/18 0805 12/18/18 1035  BP: (!) 157/85 (!) 150/95  (!) 141/98  Pulse:  93 92 93  Resp: 18 19 20 16   Temp:    98.1 F (36.7 C)  TempSrc:    Oral  SpO2:  99% 95% 98%  Weight:    94 kg  Height:    5\' 11"  (1.803 m)    Intake/Output Summary (Last 24 hours) at 12/18/2018 1246 Last data filed at 12/17/2018 2308 Gross per 24 hour  Intake 600 ml  Output 500 ml  Net 100 ml   Filed Weights   12/17/18 1411 12/18/18 1035  Weight: 90.7 kg 94 kg    Examination:  General exam: Appears calm and comfortable  Respiratory system: Clear to auscultation. Respiratory effort normal. Cardiovascular system: S1 & S2 heard, RRR. No JVD, murmurs, rubs, gallops or clicks. No pedal edema. Gastrointestinal system: Abdomen is nondistended, soft and nontender. No organomegaly or masses felt. Normal bowel sounds heard. Central nervous system: Alert and awake Extremities: Symmetric 5 x 5 power. Skin: No rashes, lesions or ulcers Psychiatry: Appears minimally confused    Data Reviewed: I have personally reviewed following labs and imaging studies  CBC: Recent Labs  Lab 12/17/18 1542 12/18/18 0120  WBC 7.3 8.0  NEUTROABS 4.2  --   HGB 11.2* 12.0*  HCT 36.9* 39.6  MCV 101.9* 102.1*  PLT 202 0000000   Basic Metabolic Panel: Recent Labs  Lab 12/17/18 1542 12/18/18 0120  NA 140 137  K 4.5 4.4  CL 110 110  CO2 21* 20*  GLUCOSE 155* 125*  BUN 29*  25*  CREATININE 1.50* 1.25*  CALCIUM 8.7* 8.5*  MG  --  1.3*   GFR: Estimated Creatinine Clearance: 47.8 mL/min (A) (by C-G formula based on SCr of 1.25 mg/dL (H)). Liver Function Tests: Recent Labs  Lab 12/17/18 1542  AST 15  ALT 11  ALKPHOS 119  BILITOT 0.6  PROT 6.7  ALBUMIN 3.1*   No results for input(s): LIPASE, AMYLASE in the last 168 hours. No results for input(s): AMMONIA in the last 168 hours. Coagulation Profile: No results for input(s): INR, PROTIME in the last 168 hours. Cardiac Enzymes: No results for input(s): CKTOTAL, CKMB, CKMBINDEX, TROPONINI in the last 168 hours. BNP (last 3 results) No results for input(s): PROBNP in the last 8760 hours. HbA1C: No results for input(s): HGBA1C in the last 72 hours. CBG: Recent Labs  Lab 12/18/18 0811 12/18/18 1218  GLUCAP 123* 157*   Lipid Profile: No results for input(s): CHOL, HDL, LDLCALC, TRIG, CHOLHDL, LDLDIRECT in the last 72 hours. Thyroid Function Tests: Recent Labs    12/18/18 0120  TSH 0.849  Anemia Panel: No results for input(s): VITAMINB12, FOLATE, FERRITIN, TIBC, IRON, RETICCTPCT in the last 72 hours. Sepsis Labs: No results for input(s): PROCALCITON, LATICACIDVEN in the last 168 hours.  Recent Results (from the past 240 hour(s))  Culture, blood (routine x 2)     Status: None (Preliminary result)   Collection Time: 12/18/18  1:05 AM   Specimen: BLOOD  Result Value Ref Range Status   Specimen Description BLOOD LEFT ANTECUBITAL  Final   Special Requests   Final    BOTTLES DRAWN AEROBIC AND ANAEROBIC Blood Culture adequate volume   Culture   Final    NO GROWTH < 12 HOURS Performed at [facility], [address], [place], [place] [address]    Report Status PENDING  Incomplete  Culture, blood (routine x 2)     Status: None (Preliminary result)   Collection Time: 12/18/18  1:17 AM   Specimen: BLOOD RIGHT HAND  Result Value Ref Range Status   Specimen Description BLOOD RIGHT HAND  Final    Special Requests   Final    BOTTLES DRAWN AEROBIC AND ANAEROBIC Blood Culture adequate volume   Culture   Final    NO GROWTH < 12 HOURS Performed at [facility], [address], [place], [place] [address]    Report Status PENDING  Incomplete         Radiology Studies: Dg Chest Portable 1 View  Result Date: 12/17/2018 CLINICAL DATA:  Short of breath EXAM: PORTABLE CHEST 1 VIEW COMPARISON:  11/13/2018 FINDINGS: Mildly low lung volume. No acute consolidation or effusion. Mild cardiomegaly. No pneumothorax. IMPRESSION: Mild cardiomegaly.  No acute airspace opacity Electronically Signed   By: Donavan Foil M.D.   On: 12/17/2018 17:59   Dg Knee Complete 4 Views Left  Result Date: 12/17/2018 CLINICAL DATA:  Bilateral knee pain EXAM: LEFT KNEE - COMPLETE 4+ VIEW COMPARISON:  None. FINDINGS: No fracture or malalignment. Trace knee effusion. Mild joint space calcifications. IMPRESSION: 1. No acute osseous abnormality 2. Trace knee effusion. Faint joint space calcifications suggesting mild chondrocalcinosis Electronically Signed   By: Donavan Foil M.D.   On: 12/17/2018 18:00   Dg Knee Complete 4 Views Right  Result Date: 12/17/2018 CLINICAL DATA:  Bilateral knee pain EXAM: RIGHT KNEE - COMPLETE 4+ VIEW COMPARISON:  None. FINDINGS: No fracture or malalignment. Mild tricompartment arthritis. Prominent joint space calcifications. Possible soft tissue defect in the suprapatellar region IMPRESSION: 1. No acute osseous abnormality.  Mild arthritis.  Chondrocalcinosis 2. Possible superficial soft tissue defect at the suprapatellar knee, correlate with physical examination Electronically Signed   By: Donavan Foil M.D.   On: 12/17/2018 18:01   US Scrotum W/doppler  Result Date: 12/17/2018 CLINICAL DATA:  Scrotal swelling and tenderness. EXAM: SCROTAL ULTRASOUND DOPPLER ULTRASOUND OF THE TESTICLES TECHNIQUE: Complete ultrasound examination of the testicles, epididymis, and other scrotal structures was  performed. Color and spectral Doppler ultrasound were also utilized to evaluate blood flow to the testicles. COMPARISON:  06/19/2016 FINDINGS: Right testicle Measurements: 3.8 x 2.7 x 2.4 cm. No mass or microlithiasis visualized. Left testicle Measurements: 3.7 x 3.0 x 2.5 cm. No mass or microlithiasis visualized. Right epididymis:  Normal in size and appearance. Left epididymis:  Normal in size and appearance. Hydrocele: Persistent moderate to large bilateral hydroceles, right larger than left. Varicocele:  None visualized. Pulsed Doppler interrogation of both testes demonstrates normal low resistance arterial and venous waveforms bilaterally. IMPRESSION: 1. Persistent moderate to large bilateral hydroceles. 2. Otherwise unremarkable examination. Electronically Signed  By: Logan Bores M.D.   On: 12/17/2018 17:00        Scheduled Meds:  aspirin  81 mg Oral Daily   atorvastatin  10 mg Oral QPM   colchicine  0.6 mg Oral q morning - 10a   enoxaparin (LOVENOX) injection  40 mg Subcutaneous Q24H   insulin aspart  0-9 Units Subcutaneous TID WC   lisinopril  40 mg Oral Daily   memantine  10 mg Oral BID   sodium chloride flush  3 mL Intravenous Q12H   tamsulosin  0.4 mg Oral QPC breakfast   Continuous Infusions:  cefTRIAXone (ROCEPHIN)  IV     magnesium sulfate bolus IVPB       LOS: 0 days    Time spent: 30 minutes    Talin Rozeboom Darleen Crocker, DO [facility] Pager [phone]  If 7PM-7AM, please contact night-coverage [email or web address] Password [email or web address] 12/18/2018, 12:46 PM

## 2018-12-18 NOTE — Progress Notes (Signed)
Infection Prevention  Patient discussed with Dorinda Hill RN and Odis Hollingshead  RN :  Unit: Dept 300 Regarding: Patient previously positive for COVID initially on 11/03/2018. IP Recommendation: Following [place] isolation guidance based on CDC guidance, patient does not require isolation being that he is > than 21 days since diagnosis and within 90 days of diagnosis and is not symptomatic. Did not require retesting and PPE is universal mask and eye protection for staff, patient should mask when staff are present.

## 2018-12-19 LAB — CBC
HCT: 37 % — ABNORMAL LOW (ref 39.0–52.0)
Hemoglobin: 11.6 g/dL — ABNORMAL LOW (ref 13.0–17.0)
MCH: 30.7 pg (ref 26.0–34.0)
MCHC: 31.4 g/dL (ref 30.0–36.0)
MCV: 97.9 fL (ref 80.0–100.0)
Platelets: 202 10*3/uL (ref 150–400)
RBC: 3.78 MIL/uL — ABNORMAL LOW (ref 4.22–5.81)
RDW: 12.8 % (ref 11.5–15.5)
WBC: 7.8 10*3/uL (ref 4.0–10.5)
nRBC: 0 % (ref 0.0–0.2)

## 2018-12-19 LAB — BASIC METABOLIC PANEL
Anion gap: 8 (ref 5–15)
BUN: 25 mg/dL — ABNORMAL HIGH (ref 8–23)
CO2: 21 mmol/L — ABNORMAL LOW (ref 22–32)
Calcium: 8.4 mg/dL — ABNORMAL LOW (ref 8.9–10.3)
Chloride: 109 mmol/L (ref 98–111)
Creatinine, Ser: 1.17 mg/dL (ref 0.61–1.24)
GFR calc Af Amer: >60 mL/min (ref >60)
GFR calc non Af Amer: 55 mL/min — ABNORMAL LOW (ref >60)
Glucose, Bld: 140 mg/dL — ABNORMAL HIGH (ref 70–99)
Potassium: 3.8 mmol/L (ref 3.5–5.1)
Sodium: 138 mmol/L (ref 135–145)

## 2018-12-19 LAB — GLUCOSE, CAPILLARY
Glucose-Capillary: 145 mg/dL — ABNORMAL HIGH (ref 70–99)
Glucose-Capillary: 160 mg/dL — ABNORMAL HIGH (ref 70–99)
Glucose-Capillary: 173 mg/dL — ABNORMAL HIGH (ref 70–99)
Glucose-Capillary: 131 mg/dL — ABNORMAL HIGH (ref 70–99)

## 2018-12-19 LAB — MAGNESIUM: Magnesium: 1.5 mg/dL — ABNORMAL LOW (ref 1.7–2.4)

## 2018-12-19 MED ORDER — MAGNESIUM SULFATE 2 GM/50ML IV SOLN
2.0000 g | Freq: Once | INTRAVENOUS | Status: AC
  Administered 2018-12-19: 2 g via INTRAVENOUS
  Filled 2018-12-19: qty 50

## 2018-12-19 NOTE — Care Management Important Message (Signed)
Important Message  Patient Details  Name: Mike Briggs MRN: [id number] Date of Birth: [birth year]   Medicare Important Message Given:  Yes     [name] 12/19/2018, 2:50 PM

## 2018-12-19 NOTE — Evaluation (Signed)
Physical Therapy Evaluation Patient Details Name: Mike Briggs MRN: [id number] DOB: 01/04/1929 Today's Date: 12/19/2018   History of Present Illness  Mike Briggs is a 85 y.o. male with medical history significant for hypertension, type 2 diabetes mellitus, stage III chronic kidney disease with baseline creatinine 1.3-1.7, chronic bilateral hydroceles, who is admitted to [facility] for overnight observation on 12/17/2018 with acute cystitis after presenting from home to [facility] emergency department complaining of dysuria.    Clinical Impression  Patient functioning near baseline for functional mobility and gait, requires much time for sitting up at bedside with [facility] raises and use of bed rails, patient states he uses overhead trapeze at home and scoots over to power scooter and non-ambulatory.  Patient very unsteady and limited to a few steps using RW to transfer to chair and tolerated sitting up after therapy - nursing staff notified.  Patient will benefit from continued physical therapy in hospital and recommended venue below to increase strength, balance, endurance for safe ADLs and gait.     Follow Up Recommendations Home health PT;Supervision for mobility/OOB;Supervision - Intermittent    Equipment Recommendations  None recommended by PT    Recommendations for Other Services       Precautions / Restrictions Precautions Precautions: Fall Restrictions Weight Bearing Restrictions: No      Mobility  Bed Mobility Overal bed mobility: Needs Assistance Bed Mobility: Supine to Sit     Supine to sit: Supervision;Min guard;HOB elevated     General bed mobility comments: requires HOB raised and use of bed rails  Transfers Overall transfer level: Needs assistance Equipment used: Rolling walker (2 wheeled) Transfers: Sit to/from [facility] Sit to Stand: Mod assist Stand pivot transfers: Mod assist       General transfer comment: slow labored  movement, usually scoots over to power scooter at home  Ambulation/Gait Ambulation/Gait assistance: Max assist Gait Distance (Feet): 3 Feet Assistive device: Rolling walker (2 wheeled) Gait Pattern/deviations: Decreased step length - right;Decreased step length - left;Decreased stride length Gait velocity: slow   General Gait Details: limited to 3-4 slow unsteady short side steps due to weakness  Stairs            Wheelchair Mobility    Modified Rankin (Stroke Patients Only)       Balance Overall balance assessment: Needs assistance Sitting-balance support: Feet supported;No upper extremity supported Sitting balance-Leahy Scale: Good Sitting balance - Comments: seated at EOB   Standing balance support: Bilateral upper extremity supported;During functional activity Standing balance-Leahy Scale: Poor Standing balance comment: using RW                             Pertinent Vitals/Pain Pain Assessment: No/denies pain    Home Living Family/patient expects to be discharged to:: Private residence Living Arrangements: Alone Available Help at Discharge: Family;Personal care attendant;Available PRN/intermittently Type of Home: Apartment Home Access: Level entry     Home Layout: One level Home Equipment: Walker - 2 wheels;Bedside commode;Shower seat;Grab bars - toilet;Grab bars - tub/shower;Electric scooter;Hospital bed      Prior Function     Gait / Transfers Assistance Needed: primarily uses scooter; ableto transfer self into scooter Mod independently  ADL's / Homemaking Assistance Needed: home aides from 8-12 am and 3-6 PM x 7 days/week, family available to come to house at night when requested        Hand Dominance   Dominant Hand: Right  Extremity/Trunk Assessment   Upper Extremity Assessment Upper Extremity Assessment: Generalized weakness    Lower Extremity Assessment Lower Extremity Assessment: Generalized weakness    Cervical /  Trunk Assessment Cervical / Trunk Assessment: Kyphotic  Communication   Communication: No difficulties  Cognition Arousal/Alertness: Awake/alert Behavior During Therapy: WFL for tasks assessed/performed Overall Cognitive Status: Within Functional Limits for tasks assessed                                        General Comments      Exercises     Assessment/Plan    PT Assessment Patient needs continued PT services  PT Problem List Decreased strength;Decreased activity tolerance;Decreased balance;Decreased mobility       PT Treatment Interventions Functional mobility training;Therapeutic activities;Therapeutic exercise;Wheelchair mobility training    PT Goals (Current goals can be found in the Care Plan section)  Acute Rehab PT Goals Patient Stated Goal: return with home aides and family to assist PT Goal Formulation: With patient Time For Goal Achievement: 12/22/18 Potential to Achieve Goals: Good    Frequency Min 3X/week   Barriers to discharge        Co-evaluation               AM-PAC PT "6 Clicks" Mobility  Outcome Measure Help needed turning from your back to your side while in a flat bed without using bedrails?: A Little Help needed moving from lying on your back to sitting on the side of a flat bed without using bedrails?: A Little Help needed moving to and from a bed to a chair (including a wheelchair)?: A Lot Help needed standing up from a chair using your arms (e.g., wheelchair or bedside chair)?: A Lot Help needed to walk in hospital room?: Total Help needed climbing 3-5 steps with a railing? : Total 6 Click Score: 12    End of Session Equipment Utilized During Treatment: Gait belt Activity Tolerance: Patient tolerated treatment well;Patient limited by fatigue Patient left: in chair;with call bell/phone within reach Nurse Communication: Mobility status PT Visit Diagnosis: Unsteadiness on feet (R26.81);Other abnormalities of gait  and mobility (R26.89);Muscle weakness (generalized) (M62.81)    Time: [id number] PT Time Calculation (min) (ACUTE ONLY): 26 min   Charges:   PT Evaluation $PT Eval Moderate Complexity: 1 Mod PT Treatments $Therapeutic Activity: 23-37 mins        11:24 AM, 12/19/18 Lonell Grandchild, MPT Physical Therapist with [facility] [phone] office [phone] mobile phone

## 2018-12-19 NOTE — TOC Initial Note (Signed)
Transition of Care [facility]) - Initial/Assessment Note    Patient Details  Name: Mike Briggs MRN: [id number] Date of Birth: 04-19-1928  Transition of [facility]) CM/SW Contact:    Json Koelzer, Chauncey [place], RN Phone Number: 12/19/2018, 3:26 PM  Clinical Narrative:      Patient her with cystitis?UTI. From home alone, has CAP aide serives 8-3 Monday-Friday. 3 hours a day Saturday and Sunday. Family lives nearby and is supportive. Has a motorized wheelchair, grab bars in bath room. Has PCP, family takes to appts. Meds are delivered by [facility]. He is active with [facility] for RN, PT, OT and SW, will resume services. Georgina Snell of [place] notified.              Expected Discharge Plan: [place] Barriers to Discharge: Continued Medical Work up   Patient Goals and CMS Choice Patient states their goals for this hospitalization and ongoing recovery are:: return home CMS Medicare.gov Compare Post Acute Care list provided to:: Patient Choice offered to / list presented to : Patient  Expected Discharge Plan and Services Expected Discharge Plan: [place]   Discharge Planning Services: CM Consult Post Acute Care Choice: Home Health, Resumption of Svcs/PTA Provider                             HH Arranged: PT, RN, OT, Social Work [place] Agency: [place] Date [facility] Agency Contacted: 12/19/18 Time [place]: [place] Representative spoke with at [place]: Georgina Snell  Prior Living Arrangements/Services       Do you feel safe going back to the place where you live?: Yes      Need for Family Participation in Patient Care: Yes (Comment) Care giver support system in place?: Yes (comment) Current home services: DME(motorized wheelchair, grab bars)    Activities of Daily Living Home Assistive Devices/Equipment: Electric scooter ADL Screening (condition at time of admission) Patient's cognitive ability adequate to safely  complete daily activities?: No Is the patient deaf or have difficulty hearing?: Yes Does the patient have difficulty seeing, even when wearing glasses/contacts?: No Does the patient have difficulty concentrating, remembering, or making decisions?: Yes Patient able to express need for assistance with ADLs?: Yes Does the patient have difficulty dressing or bathing?: No Independently performs ADLs?: No Communication: Independent Dressing (OT): Appropriate for developmental age Grooming: Appropriate for developmental age Feeding: Appropriate for developmental age Bathing: Needs assistance Is this a change from baseline?: Change from baseline, expected to last <3 days Toileting: Needs assistance Is this a change from baseline?: Change from baseline, expected to last <3 days In/Out Bed: Needs assistance Is this a change from baseline?: Change from baseline, expected to last <3 days Walks in Home: Needs assistance Is this a change from baseline?: Pre-admission baseline Does the patient have difficulty walking or climbing stairs?: Yes Weakness of Legs: Both Weakness of Arms/Hands: None  Permission Sought/Granted                  Emotional Assessment   Attitude/Demeanor/Rapport: Engaged Affect (typically observed): Appropriate Orientation: : Oriented to Self, Oriented to Place, Oriented to  Time      Admission diagnosis:  Bradycardia [R00.1] Acute lower UTI [N39.0] AKI (acute kidney injury) ([place]) [N17.9] Patient Active Problem List   Diagnosis Date Noted  . Acute cystitis 12/17/2018  . Mobitz type 1 second degree atrioventricular block   . TIA (transient  ischemic attack) 11/14/2018  . Acute encephalopathy 11/14/2018  . Sacral decubitus ulcer, stage III ([place]) 11/11/2018  . Pressure injury of skin 11/08/2018  . COVID-19 virus infection 11/03/2018  . COVID-19 virus detected 11/03/2018  . Gram-positive bacteremia 11/03/2018  . Dehydration 11/02/2018  . Rhabdomyolysis  11/02/2018  . UTI (urinary tract infection) 02/26/2018  . Syncope 02/25/2018  . Vitamin D deficiency 05/31/2016  . Essential hypertension 05/22/2016  . Hemiparesis affecting right side as late effect of cerebrovascular accident (CVA) ([place]) 05/22/2016  . Personal history of gout 05/22/2016  . BPH (benign prostatic hyperplasia) 05/22/2016  . Chronic constipation 05/22/2016  . HLD (hyperlipidemia) 05/22/2016  . Pseudophakia 12/06/2015  . Vascular dementia ([place]) 10/23/2012  . Chronic kidney disease, stage III (moderate) ([place]) 10/20/2012  . Type 2 diabetes mellitus with diabetic chronic kidney disease ([place]) 12/16/2008  . ANKLE, ARTHRITIS, DEGEN./OSTEO 12/16/2008   PCP:  Rosita Fire, MD Pharmacy:   [facility] [address] [place] [address] Phone: [phone] Fax: [phone]     Social Determinants of Health (SDOH) Interventions    Readmission Risk Interventions No flowsheet data found.

## 2018-12-19 NOTE — Progress Notes (Signed)
PROGRESS NOTE    Mike Briggs  [id number] DOB: 01/19/1929 DOA: 12/17/2018 PCP: Mike Fire, MD   Brief Narrative:  Per HPI: Mike Briggs a 85 y.o.malewith medical history significant forhypertension, type 2 diabetes mellitus, stage III chronic kidney disease with baseline creatinine 1.3-1.7, chronic bilateral hydroceles,who is admitted to [facility] for overnight observation on 12/17/2018 with acute cystitis after presenting from home to [facility] emergency department complaining of dysuria.  The patient reports 2 to 3 days of dysuria in the absence of any associated gross hematuria or change in urinary urgency/frequency. He denies any associated subjective fever, chills, rigors, or generalized myalgias. He acknowledges chronic scrotal swelling in the setting of chronic bilateral hydroceles, without any recent change thereof. He denies any recent headache, neck stiffness, sore throat, shortness of breath, cough, nausea, vomiting, diarrhea, abdominal pain, or rash. Denies any new back pain or flank tenderness. Denies any recent travel or known COVID-19 exposures.  In the setting of known second-degree AV block, Mobitz type I, the patient denies any recent chest pain, palpitations, diaphoresis, shortness of breath,presyncope, or syncope. He is unsure as to any recent dose adjustments to his outpatient Toprol-XL,which he appears to take 25 mg p.o.QAM,with most recent dose occurring on the morning of 12/17/2018. Of note, most recent echocardiogram was performed on 11/14/2018 and showed moderate LVH, LVEF 60 to 65%, no focal wall motion normalities, and no significant valvular pathology.  Of note, the patient was hospitalized to [facility] in mid October 2020 for acute encephalopathy, at which time he was diagnosed with COVID-19.At the time of my encounter with him today, the patient is alert, and oriented to place, time, self, and situation.Specifically,  he knows that he is at [name] for a urinary tract infection, he correctly states that current date,correctly recites his date of birth.He is unsure as to the current [place] president.  11/19: Patient was admitted with UTI and started on Rocephin empirically.  Urine culture still pending.  He states that he did not actually have any dysuria and states that he fell at home.  Will order PT consultation.  Covid studies currently pending.  Magnesium noted to be quite low and did not appear to be repleted.  Will order repletion today and recheck in a.m.  11/20: Patient noted to have Klebsiella pneumonia growing in urine culture and remains on Rocephin with improvement noted.  He has been assessed by physical therapy with recommendations for home health physical therapy.  Continue to follow urine sensitivities and anticipate discharge in a.m. if stable and improved.  Assessment & Plan:   Principal Problem:   Acute cystitis Active Problems:   Type 2 diabetes mellitus with diabetic chronic kidney disease (HCC)   Chronic kidney disease, stage III (moderate) (HCC)   Essential hypertension   HLD (hyperlipidemia)   UTI (urinary tract infection)   Mobitz type 1 second degree atrioventricular block   Acute cystitis with Klebsiella pneumonia -Continue empiric Rocephin and monitor sensitivities -Recheck a.m. labs  Hypomagnesemia -Replete and reevaluate in a.m.  Mobitz type I second-degree AV block with known prior history -Some bradycardia noted and plans to hold Toprol-XL per cardiology recommendations -TSH noted to be 0.849  Essential hypertension -Continue home lisinopril and hold Toprol-XL until further outpatient follow-up in cardiology clinic  Dyslipidemia -Continue home statin  Type 2 diabetes -Continue SSI  Stage III chronic kidney disease -Currently stable continue to monitor  BPH with bilateral chronic hydrocele -Continue Flomax -Scrotal ultrasound without  any acute findings   DVT prophylaxis: Lovenox Code Status: Full Family Communication:  Discussed with niece on 11/19 Disposition Plan: Continue on current IV antibiotics.  PT recommending home health PT.    Follow urine sensitivities   Consultants:   None  Procedures:   None  Antimicrobials:  Anti-infectives (From admission, onward)   Start     Dose/Rate Route Frequency Ordered Stop   12/18/18 2100  cefTRIAXone (ROCEPHIN) 1 g in sodium chloride 0.9 % 100 mL IVPB     1 g 200 mL/hr over 30 Minutes Intravenous Every 24 hours 12/18/18 0015     12/17/18 2100  cefTRIAXone (ROCEPHIN) 1 g in sodium chloride 0.9 % 100 mL IVPB     1 g 200 mL/hr over 30 Minutes Intravenous  Once 12/17/18 2051 12/17/18 2307       Subjective: Patient seen and evaluated today with no new acute complaints or concerns. No acute concerns or events noted overnight.  Objective: Vitals:   12/18/18 0805 12/18/18 1035 12/18/18 2210 12/19/18 0646  BP:  (!) 141/98 (!) 158/92 128/89  Pulse: 92 93 81 98  Resp: 20 16 20 18   Temp:  98.1 F (36.7 C) 98 F (36.7 C) 98.5 F (36.9 C)  TempSrc:  Oral Oral Oral  SpO2: 95% 98% 100% 100%  Weight:  94 kg    Height:  5\' 11"  (1.803 m)      Intake/Output Summary (Last 24 hours) at 12/19/2018 1216 Last data filed at 12/19/2018 0530 Gross per 24 hour  Intake 100.22 ml  Output 200 ml  Net -99.78 ml   Filed Weights   12/17/18 1411 12/18/18 1035  Weight: 90.7 kg 94 kg    Examination:  General exam: Appears calm and comfortable  Respiratory system: Clear to auscultation. Respiratory effort normal. Cardiovascular system: S1 & S2 heard, RRR. No JVD, murmurs, rubs, gallops or clicks. No pedal edema. Gastrointestinal system: Abdomen is nondistended, soft and nontender. No organomegaly or masses felt. Normal bowel sounds heard. Central nervous system: Alert and oriented. No focal neurological deficits. Extremities: Symmetric 5 x 5 power. Skin: No rashes,  lesions or ulcers Psychiatry: Judgement and insight appear normal. Mood & affect appropriate.     Data Reviewed: I have personally reviewed following labs and imaging studies  CBC: Recent Labs  Lab 12/17/18 1542 12/18/18 0120 12/19/18 0444  WBC 7.3 8.0 7.8  NEUTROABS 4.2  --   --   HGB 11.2* 12.0* 11.6*  HCT 36.9* 39.6 37.0*  MCV 101.9* 102.1* 97.9  PLT 202 181 [id number]   Basic Metabolic Panel: Recent Labs  Lab 12/17/18 1542 12/18/18 0120 12/19/18 0444  NA 140 137 138  K 4.5 4.4 3.8  CL 110 110 109  CO2 21* 20* 21*  GLUCOSE 155* 125* 140*  BUN 29* 25* 25*  CREATININE 1.50* 1.25* 1.17  CALCIUM 8.7* 8.5* 8.4*  MG  --  1.3* 1.5*   GFR: Estimated Creatinine Clearance: 51.1 mL/min (by C-G formula based on SCr of 1.17 mg/dL). Liver Function Tests: Recent Labs  Lab 12/17/18 1542  AST 15  ALT 11  ALKPHOS 119  BILITOT 0.6  PROT 6.7  ALBUMIN 3.1*   No results for input(s): LIPASE, AMYLASE in the last 168 hours. No results for input(s): AMMONIA in the last 168 hours. Coagulation Profile: No results for input(s): INR, PROTIME in the last 168 hours. Cardiac Enzymes: No results for input(s): CKTOTAL, CKMB, CKMBINDEX, TROPONINI in the last 168 hours. BNP (last 3 results) No  results for input(s): PROBNP in the last 8760 hours. HbA1C: No results for input(s): HGBA1C in the last 72 hours. CBG: Recent Labs  Lab 12/18/18 1218 12/18/18 1706 12/18/18 2207 12/19/18 0808 12/19/18 1144  GLUCAP 157* 144* 182* 145* 160*   Lipid Profile: No results for input(s): CHOL, HDL, LDLCALC, TRIG, CHOLHDL, LDLDIRECT in the last 72 hours. Thyroid Function Tests: Recent Labs    12/18/18 0120  TSH 0.849   Anemia Panel: No results for input(s): VITAMINB12, FOLATE, FERRITIN, TIBC, IRON, RETICCTPCT in the last 72 hours. Sepsis Labs: No results for input(s): PROCALCITON, LATICACIDVEN in the last 168 hours.  Recent Results (from the past 240 hour(s))  Urine culture     Status:  Abnormal (Preliminary result)   Collection Time: 12/17/18  8:52 PM   Specimen: Urine, Clean Catch  Result Value Ref Range Status   Specimen Description   Final    URINE, CLEAN CATCH Performed at [facility], [address] [place], [place] [address]    Special Requests   Final    NONE Performed at [facility], [address] [place], [place] [address]    Culture (A)  Final    >=100,000 COLONIES/mL KLEBSIELLA PNEUMONIAE SUSCEPTIBILITIES TO FOLLOW Performed at [facility], [place] [address], [place], [place] [address]    Report Status PENDING  Incomplete  SARS CORONAVIRUS 2 (TAT 6-24 HRS) Nasopharyngeal Nasopharyngeal Swab     Status: Abnormal   Collection Time: 12/17/18  9:08 PM   Specimen: Nasopharyngeal Swab  Result Value Ref Range Status   SARS Coronavirus 2 POSITIVE (A) NEGATIVE Final    Comment: RESULT CALLED TO, READ BACK BY AND VERIFIED WITH: V BASS,RN 1804 12/18/2018 D BRADLEY Performed at [facility], [place] [address], [place], [place] [address]   Culture, blood (routine x 2)     Status: None (Preliminary result)   Collection Time: 12/18/18  1:05 AM   Specimen: BLOOD  Result Value Ref Range Status   Specimen Description BLOOD LEFT ANTECUBITAL  Final   Special Requests   Final    BOTTLES DRAWN AEROBIC AND ANAEROBIC Blood Culture adequate volume   Culture   Final    NO GROWTH 1 DAY Performed at [facility], [address] [place], [place] [address]    Report Status PENDING  Incomplete  Culture, blood (routine x 2)     Status: None (Preliminary result)   Collection Time: 12/18/18  1:17 AM   Specimen: BLOOD RIGHT HAND  Result Value Ref Range Status   Specimen Description BLOOD RIGHT HAND  Final   Special Requests   Final    BOTTLES DRAWN AEROBIC AND ANAEROBIC Blood Culture adequate volume   Culture   Final    NO GROWTH 1 DAY Performed at [facility], [address] [place],  [address]    Report Status PENDING  Incomplete          Radiology Studies: Dg Chest Portable 1 View  Result Date: 12/17/2018 CLINICAL DATA:  Short of breath EXAM: PORTABLE CHEST 1 VIEW COMPARISON:  11/13/2018 FINDINGS: Mildly low lung volume. No acute consolidation or effusion. Mild cardiomegaly. No pneumothorax. IMPRESSION: Mild cardiomegaly.  No acute airspace opacity Electronically Signed   By: Donavan Foil M.D.   On: 12/17/2018 17:59   Dg Knee Complete 4 Views Left  Result Date: 12/17/2018 CLINICAL DATA:  Bilateral knee pain EXAM: LEFT KNEE - COMPLETE 4+ VIEW COMPARISON:  None. FINDINGS: No fracture or malalignment. Trace knee effusion.  Mild joint space calcifications. IMPRESSION: 1. No acute osseous abnormality 2. Trace knee effusion. Faint joint space calcifications suggesting mild chondrocalcinosis Electronically Signed   By: Donavan Foil M.D.   On: 12/17/2018 18:00   Dg Knee Complete 4 Views Right  Result Date: 12/17/2018 CLINICAL DATA:  Bilateral knee pain EXAM: RIGHT KNEE - COMPLETE 4+ VIEW COMPARISON:  None. FINDINGS: No fracture or malalignment. Mild tricompartment arthritis. Prominent joint space calcifications. Possible soft tissue defect in the suprapatellar region IMPRESSION: 1. No acute osseous abnormality.  Mild arthritis.  Chondrocalcinosis 2. Possible superficial soft tissue defect at the suprapatellar knee, correlate with physical examination Electronically Signed   By: Donavan Foil M.D.   On: 12/17/2018 18:01   US Scrotum W/doppler  Result Date: 12/17/2018 CLINICAL DATA:  Scrotal swelling and tenderness. EXAM: SCROTAL ULTRASOUND DOPPLER ULTRASOUND OF THE TESTICLES TECHNIQUE: Complete ultrasound examination of the testicles, epididymis, and other scrotal structures was performed. Color and spectral Doppler ultrasound were also utilized to evaluate blood flow to the testicles. COMPARISON:  06/19/2016 FINDINGS: Right testicle Measurements: 3.8 x 2.7 x 2.4 cm. No mass or microlithiasis visualized. Left testicle Measurements: 3.7 x  3.0 x 2.5 cm. No mass or microlithiasis visualized. Right epididymis:  Normal in size and appearance. Left epididymis:  Normal in size and appearance. Hydrocele: Persistent moderate to large bilateral hydroceles, right larger than left. Varicocele:  None visualized. Pulsed Doppler interrogation of both testes demonstrates normal low resistance arterial and venous waveforms bilaterally. IMPRESSION: 1. Persistent moderate to large bilateral hydroceles. 2. Otherwise unremarkable examination. Electronically Signed   By: Logan Bores M.D.   On: 12/17/2018 17:00        Scheduled Meds: . aspirin  81 mg Oral Daily  . atorvastatin  10 mg Oral QPM  . colchicine  0.6 mg Oral q morning - 10a  . enoxaparin (LOVENOX) injection  40 mg Subcutaneous Q24H  . insulin aspart  0-9 Units Subcutaneous TID WC  . lisinopril  40 mg Oral Daily  . memantine  10 mg Oral BID  . sodium chloride flush  3 mL Intravenous Q12H  . tamsulosin  0.4 mg Oral QPC breakfast   Continuous Infusions: . cefTRIAXone (ROCEPHIN)  IV Stopped (12/18/18 2247)     LOS: 1 day    Time spent: 30 minutes    Timiyah Romito Darleen Crocker, DO [facility] Pager [phone]  If 7PM-7AM, please contact night-coverage [email or web address] Password [email or web address] 12/19/2018, 12:16 PM

## 2018-12-19 NOTE — Plan of Care (Signed)
  Problem: Education: Goal: Knowledge of General Education information will improve Description Including pain rating scale, medication(s)/side effects and non-pharmacologic comfort measures Outcome: Progressing   Problem: Health Behavior/Discharge Planning: Goal: Ability to manage health-related needs will improve Outcome: Progressing   

## 2018-12-19 NOTE — Plan of Care (Signed)
  Problem: Acute Rehab PT Goals(only PT should resolve) Goal: Pt Will Go Supine/Side To Sit Outcome: Progressing Flowsheets (Taken 12/19/2018 1125) Pt will go Supine/Side to Sit: with modified independence Goal: Patient Will Transfer Sit To/From Stand Outcome: Progressing Flowsheets (Taken 12/19/2018 1125) Patient will transfer sit to/from stand:  with min guard assist  with minimal assist Goal: Pt Will Transfer Bed To Chair/Chair To Bed Outcome: Progressing Flowsheets (Taken 12/19/2018 1125) Pt will Transfer Bed to Chair/Chair to Bed:  with min assist  with mod assist   11:25 AM, 12/19/18 Lonell Grandchild, MPT Physical Therapist with [facility] [phone] office [phone] mobile phone

## 2018-12-20 LAB — BASIC METABOLIC PANEL
Anion gap: 9 (ref 5–15)
BUN: 23 mg/dL (ref 8–23)
CO2: 20 mmol/L — ABNORMAL LOW (ref 22–32)
Calcium: 8.4 mg/dL — ABNORMAL LOW (ref 8.9–10.3)
Chloride: 109 mmol/L (ref 98–111)
Creatinine, Ser: 1.19 mg/dL (ref 0.61–1.24)
GFR calc Af Amer: >60 mL/min (ref >60)
GFR calc non Af Amer: 54 mL/min — ABNORMAL LOW (ref >60)
Glucose, Bld: 126 mg/dL — ABNORMAL HIGH (ref 70–99)
Potassium: 3.9 mmol/L (ref 3.5–5.1)
Sodium: 138 mmol/L (ref 135–145)

## 2018-12-20 LAB — CBC
HCT: 37.5 % — ABNORMAL LOW (ref 39.0–52.0)
Hemoglobin: 11.4 g/dL — ABNORMAL LOW (ref 13.0–17.0)
MCH: 30.5 pg (ref 26.0–34.0)
MCHC: 30.4 g/dL (ref 30.0–36.0)
MCV: 100.3 fL — ABNORMAL HIGH (ref 80.0–100.0)
Platelets: 186 10*3/uL (ref 150–400)
RBC: 3.74 MIL/uL — ABNORMAL LOW (ref 4.22–5.81)
RDW: 13.2 % (ref 11.5–15.5)
WBC: 6.9 10*3/uL (ref 4.0–10.5)
nRBC: 0 % (ref 0.0–0.2)

## 2018-12-20 LAB — URINE CULTURE: Culture: >=100000 — AB

## 2018-12-20 LAB — GLUCOSE, CAPILLARY
Glucose-Capillary: 122 mg/dL — ABNORMAL HIGH (ref 70–99)
Glucose-Capillary: 196 mg/dL — ABNORMAL HIGH (ref 70–99)

## 2018-12-20 LAB — MAGNESIUM: Magnesium: 1.7 mg/dL (ref 1.7–2.4)

## 2018-12-20 MED ORDER — SULFAMETHOXAZOLE-TRIMETHOPRIM 800-160 MG PO TABS: 1.0000 | ORAL_TABLET | Freq: Two times a day (BID) | ORAL | 0 refills | Status: AC

## 2018-12-20 NOTE — TOC Transition Note (Signed)
Transition of Care [facility]) - CM/SW Discharge Note   Patient Details  Name: Mike Briggs MRN: [id number] Date of Birth: 12-May-1928  [facility] CM/SW Contact:  Marshell Garfinkel, RN Phone Number: 12/20/2018, 10:51 AM   Clinical Narrative:    Alvis Lemmings notified of patient discharge to home today: spoke with [facility]. RNCM attempted to speak with patient [phone] however there was no answer. I also attempted to reach patient's niece Stanton Kidney [phone] but no answer and unable to leave message. Alvis Lemmings was not aware of hospitalization and had been trying to reach patient without answer. No further RNCM needs.     Final next level of care: [place] Barriers to Discharge: Continued Medical Work up   Patient Goals and CMS Choice Patient states their goals for this hospitalization and ongoing recovery are:: return home CMS Medicare.gov Compare Post Acute Care list provided to:: Patient Choice offered to / list presented to : Patient  Discharge Placement                       Discharge Plan and Services   Discharge Planning Services: CM Consult Post Acute Care Choice: Home Health, Resumption of Svcs/PTA Provider                    HH Arranged: PT [place] Agency: [place] Date [place]: 12/20/18 Time [place]: [id number] Representative spoke with at [place]: Alvis Lemmings on call rep to relay message to Jinny Blossom Tommi Rumps not on call)  Social Determinants of Health (SDOH) Interventions     Readmission Risk Interventions No flowsheet data found.

## 2018-12-20 NOTE — Plan of Care (Signed)

## 2018-12-20 NOTE — Progress Notes (Signed)
IV removed, patient tolerate well. Patient transported home by [facility], and patient's niece, Cristina Hensey, made aware of patient's discharge.

## 2018-12-20 NOTE — Discharge Summary (Signed)
Physician Discharge Summary  Mike Briggs [id number] DOB: 04-25-1928 DOA: 12/17/2018  PCP: Rosita Fire, MD  Admit date: 12/17/2018  Discharge date: 12/20/2018  Admitted From:Home  Disposition:  Home  Recommendations for Outpatient Follow-up:  1. Follow up with PCP in 1-2 weeks and follow up BP readings 2. Finish course of Bactrim as prescribed for 4 more days to complete course of treatment for Klebsiella UTI 3. Hold home lisinopril and metoprolol for now due to some softer blood pressure readings in the hospital and resume once BP has been reevaluated by PCP 4. Continue other home medications as prior  Home Health: Continue with home health services to include RN, PT, OT, and CSW  Equipment/Devices: None  Discharge Condition: Stable  CODE STATUS: Full  Diet recommendation: Heart Healthy/carb modified  Brief/Interim Summary: Per HPI: Mike Schellinger Holmesis a 85 y.o.malewith medical history significant forhypertension, type 2 diabetes mellitus, stage III chronic kidney disease with baseline creatinine 1.3-1.7, chronic bilateral hydroceles,who is admitted to [facility] for overnight observation on 12/17/2018 with acute cystitis after presenting from home to [facility] emergency department complaining of dysuria.  The patient reports 2 to 3 days of dysuria in the absence of any associated gross hematuria or change in urinary urgency/frequency. He denies any associated subjective fever, chills, rigors, or generalized myalgias. He acknowledges chronic scrotal swelling in the setting of chronic bilateral hydroceles, without any recent change thereof. He denies any recent headache, neck stiffness, sore throat, shortness of breath, cough, nausea, vomiting, diarrhea, abdominal pain, or rash. Denies any new back pain or flank tenderness. Denies any recent travel or known COVID-19 exposures.  In the setting of known second-degree AV block, Mobitz type I, the patient denies  any recent chest pain, palpitations, diaphoresis, shortness of breath,presyncope, or syncope. He is unsure as to any recent dose adjustments to his outpatient Toprol-XL,which he appears to take 25 mg p.o.QAM,with most recent dose occurring on the morning of 12/17/2018. Of note, most recent echocardiogram was performed on 11/14/2018 and showed moderate LVH, LVEF 60 to 65%, no focal wall motion normalities, and no significant valvular pathology.  Of note, the patient was hospitalized to [facility] in mid October 2020 for acute encephalopathy, at which time he was diagnosed with COVID-19.At the time of my encounter with him today, the patient is alert, and oriented to place, time, self, and situation.Specifically, he knows that he is at [facility] for a urinary tract infection, he correctly states that current date,correctly recites his date of birth.He is unsure as to the current [place] president.  11/19:Patient was admitted with UTI and started on Rocephin empirically. Urine culture still pending. He states that he did not actually have any dysuria and states that he fell at home. Will order PT consultation. Covid studies currently pending. Magnesium noted to be quite low and did not appear to be repleted. Will order repletion today and recheck in a.m.  11/20: Patient noted to have Klebsiella pneumonia growing in urine culture and remains on Rocephin with improvement noted.  He has been assessed by physical therapy with recommendations for home health physical therapy.  Continue to follow urine sensitivities and anticipate discharge in a.m. if stable and improved.  11/21: Patient noted to have sensitivity to Bactrim with Klebsiella pneumonia and patient will be switched over to oral dosing of Bactrim to continue for 4 more days to continue course of treatment.  His magnesium levels have now normalized and his labs are otherwise stable.  His  blood pressures remain a little  bit soft, but he is asymptomatic.  Recommend holding home antihypertensives for now.  He will resume his usual home health services otherwise.  No other acute events noted throughout the course of this hospitalization.  Discharge Diagnoses:  Principal Problem:   Acute cystitis Active Problems:   Type 2 diabetes mellitus with diabetic chronic kidney disease (HCC)   Chronic kidney disease, stage III (moderate) (HCC)   Essential hypertension   HLD (hyperlipidemia)   UTI (urinary tract infection)   Mobitz type 1 second degree atrioventricular block  Principal discharge diagnosis: Acute UTI with Klebsiella pneumonia.  Discharge Instructions  Discharge Instructions    Diet - low sodium heart healthy   Complete by: As directed    Increase activity slowly   Complete by: As directed      Allergies as of 12/20/2018   No Known Allergies     Medication List    STOP taking these medications   lisinopril 40 MG tablet Commonly known as: ZESTRIL   metoprolol succinate 25 MG 24 hr tablet Commonly known as: TOPROL-XL     TAKE these medications   amLODipine 10 MG tablet Commonly known as: NORVASC Take 1 tablet (10 mg total) by mouth daily.   ascorbic acid 500 MG tablet Commonly known as: VITAMIN C Take 1 tablet (500 mg total) by mouth daily.   aspirin 81 MG chewable tablet Chew 1 tablet (81 mg total) by mouth daily.   atorvastatin 10 MG tablet Commonly known as: LIPITOR TAKE 1 TABLET BY MOUTH AFTER SUPPER. What changed: See the new instructions.   colchicine 0.6 MG tablet Take 1 tablet (0.6 mg total) by mouth daily. What changed: when to take this   memantine 10 MG tablet Commonly known as: NAMENDA Take 10 mg by mouth 2 (two) times daily.   metFORMIN 500 MG tablet Commonly known as: Glucophage Take 1 tablet (500 mg total) by mouth 2 (two) times daily with a meal.   sulfamethoxazole-trimethoprim 800-160 MG tablet Commonly known as: BACTRIM DS Take 1 tablet by mouth 2  (two) times daily for 4 days.   tamsulosin 0.4 MG Caps capsule Commonly known as: FLOMAX Take 1 capsule (0.4 mg total) by mouth daily after breakfast.   zinc sulfate 220 (50 Zn) MG capsule Take 1 capsule (220 mg total) by mouth daily.      Follow-up Information    Rosita Fire, MD Follow up on 12/23/2018.   Specialty: Internal Medicine Why: Please follow up with Dr. Legrand Rams on Tuesday, November 24th at 10:45am. Contact information: [place] [address] [phone]          No Known Allergies  Consultations:  None   Procedures/Studies: Dg Chest Portable 1 View  Result Date: 12/17/2018 CLINICAL DATA:  Short of breath EXAM: PORTABLE CHEST 1 VIEW COMPARISON:  11/13/2018 FINDINGS: Mildly low lung volume. No acute consolidation or effusion. Mild cardiomegaly. No pneumothorax. IMPRESSION: Mild cardiomegaly.  No acute airspace opacity Electronically Signed   By: Donavan Foil M.D.   On: 12/17/2018 17:59   Dg Knee Complete 4 Views Left  Result Date: 12/17/2018 CLINICAL DATA:  Bilateral knee pain EXAM: LEFT KNEE - COMPLETE 4+ VIEW COMPARISON:  None. FINDINGS: No fracture or malalignment. Trace knee effusion. Mild joint space calcifications. IMPRESSION: 1. No acute osseous abnormality 2. Trace knee effusion. Faint joint space calcifications suggesting mild chondrocalcinosis Electronically Signed   By: Donavan Foil M.D.   On: 12/17/2018 18:00  Dg Knee Complete 4 Views Right  Result Date: 12/17/2018 CLINICAL DATA:  Bilateral knee pain EXAM: RIGHT KNEE - COMPLETE 4+ VIEW COMPARISON:  None. FINDINGS: No fracture or malalignment. Mild tricompartment arthritis. Prominent joint space calcifications. Possible soft tissue defect in the suprapatellar region IMPRESSION: 1. No acute osseous abnormality.  Mild arthritis.  Chondrocalcinosis 2. Possible superficial soft tissue defect at the suprapatellar knee, correlate with physical examination Electronically Signed    By: Donavan Foil M.D.   On: 12/17/2018 18:01   US Scrotum W/doppler  Result Date: 12/17/2018 CLINICAL DATA:  Scrotal swelling and tenderness. EXAM: SCROTAL ULTRASOUND DOPPLER ULTRASOUND OF THE TESTICLES TECHNIQUE: Complete ultrasound examination of the testicles, epididymis, and other scrotal structures was performed. Color and spectral Doppler ultrasound were also utilized to evaluate blood flow to the testicles. COMPARISON:  06/19/2016 FINDINGS: Right testicle Measurements: 3.8 x 2.7 x 2.4 cm. No mass or microlithiasis visualized. Left testicle Measurements: 3.7 x 3.0 x 2.5 cm. No mass or microlithiasis visualized. Right epididymis:  Normal in size and appearance. Left epididymis:  Normal in size and appearance. Hydrocele: Persistent moderate to large bilateral hydroceles, right larger than left. Varicocele:  None visualized. Pulsed Doppler interrogation of both testes demonstrates normal low resistance arterial and venous waveforms bilaterally. IMPRESSION: 1. Persistent moderate to large bilateral hydroceles. 2. Otherwise unremarkable examination. Electronically Signed   By: Logan Bores M.D.   On: 12/17/2018 17:00     Discharge Exam: Vitals:   12/20/18 0646 12/20/18 0655  BP: (!) 101/51 (!) 99/45  Pulse: 87 84  Resp: 18   Temp: 98 F (36.7 C) 98 F (36.7 C)  SpO2: 96% 99%   Vitals:   12/19/18 0646 12/19/18 2130 12/20/18 0646 12/20/18 0655  BP: 128/89 (!) 156/90 (!) 101/51 (!) 99/45  Pulse: 98 79 87 84  Resp: 18 20 18    Temp: 98.5 F (36.9 C) 98.2 F (36.8 C) 98 F (36.7 C) 98 F (36.7 C)  TempSrc: Oral Oral Oral Oral  SpO2: 100% 100% 96% 99%  Weight:      Height:        General: Pt is alert, awake, not in acute distress Cardiovascular: RRR, S1/S2 +, no rubs, no gallops Respiratory: CTA bilaterally, no wheezing, no rhonchi Abdominal: Soft, NT, ND, bowel sounds + Extremities: no edema, no cyanosis    The results of significant diagnostics from this hospitalization  (including imaging, microbiology, ancillary and laboratory) are listed below for reference.     Microbiology: Recent Results (from the past 240 hour(s))  Urine culture     Status: Abnormal   Collection Time: 12/17/18  8:52 PM   Specimen: Urine, Clean Catch  Result Value Ref Range Status   Specimen Description   Final    URINE, CLEAN CATCH Performed at [facility], [address] [place], [place] [address]    Special Requests   Final    NONE Performed at [facility], [address] [place], [place] [address]    Culture >=100,000 COLONIES/mL KLEBSIELLA PNEUMONIAE (A)  Final   Report Status 12/20/2018 FINAL  Final   Organism ID, Bacteria KLEBSIELLA PNEUMONIAE (A)  Final      Susceptibility   Klebsiella pneumoniae - MIC*    AMPICILLIN >=32 RESISTANT Resistant     CEFAZOLIN <=4 SENSITIVE Sensitive     CEFTRIAXONE <=1 SENSITIVE Sensitive     CIPROFLOXACIN <=0.25 SENSITIVE Sensitive     GENTAMICIN <=1 SENSITIVE Sensitive     IMIPENEM <=0.25 SENSITIVE Sensitive  NITROFURANTOIN 128 RESISTANT Resistant     TRIMETH/SULFA <=20 SENSITIVE Sensitive     AMPICILLIN/SULBACTAM >=32 RESISTANT Resistant     PIP/TAZO 8 SENSITIVE Sensitive     Extended ESBL NEGATIVE Sensitive     * >=100,000 COLONIES/mL KLEBSIELLA PNEUMONIAE  SARS CORONAVIRUS 2 (TAT 6-24 HRS) Nasopharyngeal Nasopharyngeal Swab     Status: Abnormal   Collection Time: 12/17/18  9:08 PM   Specimen: Nasopharyngeal Swab  Result Value Ref Range Status   SARS Coronavirus 2 POSITIVE (A) NEGATIVE Final    Comment: RESULT CALLED TO, READ BACK BY AND VERIFIED WITH: V BASS,RN 1804 12/18/2018 D BRADLEY Performed at [facility], [place] [address] [place], [place] [address]   Culture, blood (routine x 2)     Status: None (Preliminary result)   Collection Time: 12/18/18  1:05 AM   Specimen: BLOOD  Result Value Ref Range Status   Specimen Description BLOOD LEFT ANTECUBITAL  Final   Special Requests   Final    BOTTLES DRAWN  AEROBIC AND ANAEROBIC Blood Culture adequate volume   Culture   Final    NO GROWTH 2 DAYS Performed at [facility], [address], [place], [place] [address]    Report Status PENDING  Incomplete  Culture, blood (routine x 2)     Status: None (Preliminary result)   Collection Time: 12/18/18  1:17 AM   Specimen: BLOOD RIGHT HAND  Result Value Ref Range Status   Specimen Description BLOOD RIGHT HAND  Final   Special Requests   Final    BOTTLES DRAWN AEROBIC AND ANAEROBIC Blood Culture adequate volume   Culture   Final    NO GROWTH 2 DAYS Performed at [facility], [address] [place], [place] [address]    Report Status PENDING  Incomplete     Labs: BNP (last 3 results) No results for input(s): BNP in the last 8760 hours. Basic Metabolic Panel: Recent Labs  Lab 12/17/18 1542 12/18/18 0120 12/19/18 0444 12/20/18 0635  NA 140 137 138 138  K 4.5 4.4 3.8 3.9  CL 110 110 109 109  CO2 21* 20* 21* 20*  GLUCOSE 155* 125* 140* 126*  BUN 29* 25* 25* 23  CREATININE 1.50* 1.25* 1.17 1.19  CALCIUM 8.7* 8.5* 8.4* 8.4*  MG  --  1.3* 1.5* 1.7   Liver Function Tests: Recent Labs  Lab 12/17/18 1542  AST 15  ALT 11  ALKPHOS 119  BILITOT 0.6  PROT 6.7  ALBUMIN 3.1*   No results for input(s): LIPASE, AMYLASE in the last 168 hours. No results for input(s): AMMONIA in the last 168 hours. CBC: Recent Labs  Lab 12/17/18 1542 12/18/18 0120 12/19/18 0444 12/20/18 0635  WBC 7.3 8.0 7.8 6.9  NEUTROABS 4.2  --   --   --   HGB 11.2* 12.0* 11.6* 11.4*  HCT 36.9* 39.6 37.0* 37.5*  MCV 101.9* 102.1* 97.9 100.3*  PLT 202 181 202 186   Cardiac Enzymes: No results for input(s): CKTOTAL, CKMB, CKMBINDEX, TROPONINI in the last 168 hours. BNP: Invalid input(s): POCBNP CBG: Recent Labs  Lab 12/19/18 0808 12/19/18 1144 12/19/18 1627 12/19/18 2126 12/20/18 0744  GLUCAP 145* 160* 173* 131* 122*   D-Dimer No results for input(s): DDIMER in the last 72 hours. Hgb A1c No results  for input(s): HGBA1C in the last 72 hours. Lipid Profile No results for input(s): CHOL, HDL, LDLCALC, TRIG, CHOLHDL, LDLDIRECT in the last 72 hours. Thyroid function studies Recent Labs  12/18/18 0120  TSH 0.849   Anemia work up No results for input(s): VITAMINB12, FOLATE, FERRITIN, TIBC, IRON, RETICCTPCT in the last 72 hours. Urinalysis    Component Value Date/Time   COLORURINE YELLOW 12/17/2018 1509   APPEARANCEUR CLOUDY (A) 12/17/2018 1509   LABSPEC 1.016 12/17/2018 1509   PHURINE 5.0 12/17/2018 [place] 12/17/2018 1509   HGBUR NEGATIVE 12/17/2018 [place] 12/17/2018 1509   KETONESUR NEGATIVE 12/17/2018 1509   PROTEINUR 30 (A) 12/17/2018 1509   UROBILINOGEN 0.2 03/21/2014 1840   NITRITE NEGATIVE 12/17/2018 1509   LEUKOCYTESUR LARGE (A) 12/17/2018 1509   Sepsis Labs Invalid input(s): PROCALCITONIN,  WBC,  LACTICIDVEN Microbiology Recent Results (from the past 240 hour(s))  Urine culture     Status: Abnormal   Collection Time: 12/17/18  8:52 PM   Specimen: Urine, Clean Catch  Result Value Ref Range Status   Specimen Description   Final    URINE, CLEAN CATCH Performed at [facility], [address], [place], [place] [address]    Special Requests   Final    NONE Performed at [facility], [address], [place], [place] [address]    Culture >=100,000 COLONIES/mL KLEBSIELLA PNEUMONIAE (A)  Final   Report Status 12/20/2018 FINAL  Final   Organism ID, Bacteria KLEBSIELLA PNEUMONIAE (A)  Final      Susceptibility   Klebsiella pneumoniae - MIC*    AMPICILLIN >=32 RESISTANT Resistant     CEFAZOLIN <=4 SENSITIVE Sensitive     CEFTRIAXONE <=1 SENSITIVE Sensitive     CIPROFLOXACIN <=0.25 SENSITIVE Sensitive     GENTAMICIN <=1 SENSITIVE Sensitive     IMIPENEM <=0.25 SENSITIVE Sensitive     NITROFURANTOIN 128 RESISTANT Resistant     TRIMETH/SULFA <=20 SENSITIVE Sensitive     AMPICILLIN/SULBACTAM >=32 RESISTANT Resistant     PIP/TAZO 8  SENSITIVE Sensitive     Extended ESBL NEGATIVE Sensitive     * >=100,000 COLONIES/mL KLEBSIELLA PNEUMONIAE  SARS CORONAVIRUS 2 (TAT 6-24 HRS) Nasopharyngeal Nasopharyngeal Swab     Status: Abnormal   Collection Time: 12/17/18  9:08 PM   Specimen: Nasopharyngeal Swab  Result Value Ref Range Status   SARS Coronavirus 2 POSITIVE (A) NEGATIVE Final    Comment: RESULT CALLED TO, READ BACK BY AND VERIFIED WITH: V BASS,RN 1804 12/18/2018 D BRADLEY Performed at [facility], [place] [address], [place], [place] [address]   Culture, blood (routine x 2)     Status: None (Preliminary result)   Collection Time: 12/18/18  1:05 AM   Specimen: BLOOD  Result Value Ref Range Status   Specimen Description BLOOD LEFT ANTECUBITAL  Final   Special Requests   Final    BOTTLES DRAWN AEROBIC AND ANAEROBIC Blood Culture adequate volume   Culture   Final    NO GROWTH 2 DAYS Performed at [facility], [address] [place],  [address]    Report Status PENDING  Incomplete  Culture, blood (routine x 2)     Status: None (Preliminary result)   Collection Time: 12/18/18  1:17 AM   Specimen: BLOOD RIGHT HAND  Result Value Ref Range Status   Specimen Description BLOOD RIGHT HAND  Final   Special Requests   Final    BOTTLES DRAWN AEROBIC AND ANAEROBIC Blood Culture adequate volume   Culture   Final    NO GROWTH 2 DAYS Performed at [facility], [address] [place],  [address]    Report Status  PENDING  Incomplete     Time coordinating discharge: 35 minutes  SIGNED:   Rodena Goldmann, DO [facility] 12/20/2018, 10:02 AM  If 7PM-7AM, please contact night-coverage [email or web address]

## 2018-12-22 DIAGNOSIS — I69351 Hemiplegia and hemiparesis following cerebral infarction affecting right dominant side: Secondary | ICD-10-CM | POA: Diagnosis not present

## 2018-12-22 DIAGNOSIS — G9340 Encephalopathy, unspecified: Secondary | ICD-10-CM | POA: Diagnosis not present

## 2018-12-22 DIAGNOSIS — I1310 Hypertensive heart and chronic kidney disease without heart failure, with stage 1 through stage 4 chronic kidney disease, or unspecified chronic kidney disease: Secondary | ICD-10-CM | POA: Diagnosis not present

## 2018-12-22 DIAGNOSIS — F0150 Vascular dementia without behavioral disturbance: Secondary | ICD-10-CM | POA: Diagnosis not present

## 2018-12-22 DIAGNOSIS — E860 Dehydration: Secondary | ICD-10-CM | POA: Diagnosis not present

## 2018-12-22 DIAGNOSIS — U071 COVID-19: Secondary | ICD-10-CM | POA: Diagnosis not present

## 2018-12-23 LAB — CULTURE, BLOOD (ROUTINE X 2)
Culture: NO GROWTH
Culture: NO GROWTH
Special Requests: ADEQUATE
Special Requests: ADEQUATE

## 2018-12-24 DIAGNOSIS — I1310 Hypertensive heart and chronic kidney disease without heart failure, with stage 1 through stage 4 chronic kidney disease, or unspecified chronic kidney disease: Secondary | ICD-10-CM | POA: Diagnosis not present

## 2018-12-24 DIAGNOSIS — E860 Dehydration: Secondary | ICD-10-CM | POA: Diagnosis not present

## 2018-12-24 DIAGNOSIS — U071 COVID-19: Secondary | ICD-10-CM | POA: Diagnosis not present

## 2018-12-24 DIAGNOSIS — F0150 Vascular dementia without behavioral disturbance: Secondary | ICD-10-CM | POA: Diagnosis not present

## 2018-12-24 DIAGNOSIS — I69351 Hemiplegia and hemiparesis following cerebral infarction affecting right dominant side: Secondary | ICD-10-CM | POA: Diagnosis not present

## 2018-12-24 DIAGNOSIS — G9340 Encephalopathy, unspecified: Secondary | ICD-10-CM | POA: Diagnosis not present

## 2018-12-29 DIAGNOSIS — I69351 Hemiplegia and hemiparesis following cerebral infarction affecting right dominant side: Secondary | ICD-10-CM | POA: Diagnosis not present

## 2018-12-29 DIAGNOSIS — I1310 Hypertensive heart and chronic kidney disease without heart failure, with stage 1 through stage 4 chronic kidney disease, or unspecified chronic kidney disease: Secondary | ICD-10-CM | POA: Diagnosis not present

## 2018-12-29 DIAGNOSIS — F0150 Vascular dementia without behavioral disturbance: Secondary | ICD-10-CM | POA: Diagnosis not present

## 2018-12-29 DIAGNOSIS — U071 COVID-19: Secondary | ICD-10-CM | POA: Diagnosis not present

## 2018-12-29 DIAGNOSIS — G9340 Encephalopathy, unspecified: Secondary | ICD-10-CM | POA: Diagnosis not present

## 2018-12-29 DIAGNOSIS — E860 Dehydration: Secondary | ICD-10-CM | POA: Diagnosis not present

## 2018-12-30 DIAGNOSIS — I69351 Hemiplegia and hemiparesis following cerebral infarction affecting right dominant side: Secondary | ICD-10-CM | POA: Diagnosis not present

## 2018-12-30 DIAGNOSIS — I1310 Hypertensive heart and chronic kidney disease without heart failure, with stage 1 through stage 4 chronic kidney disease, or unspecified chronic kidney disease: Secondary | ICD-10-CM | POA: Diagnosis not present

## 2018-12-30 DIAGNOSIS — F0150 Vascular dementia without behavioral disturbance: Secondary | ICD-10-CM | POA: Diagnosis not present

## 2018-12-30 DIAGNOSIS — U071 COVID-19: Secondary | ICD-10-CM | POA: Diagnosis not present

## 2018-12-30 DIAGNOSIS — G9340 Encephalopathy, unspecified: Secondary | ICD-10-CM | POA: Diagnosis not present

## 2018-12-30 DIAGNOSIS — E860 Dehydration: Secondary | ICD-10-CM | POA: Diagnosis not present

## 2018-12-31 ENCOUNTER — Encounter

## 2018-12-31 ENCOUNTER — Inpatient Hospital Stay: Payer: Self-pay | Admitting: Adult Health

## 2019-01-01 DIAGNOSIS — I69351 Hemiplegia and hemiparesis following cerebral infarction affecting right dominant side: Secondary | ICD-10-CM | POA: Diagnosis not present

## 2019-01-01 DIAGNOSIS — E860 Dehydration: Secondary | ICD-10-CM | POA: Diagnosis not present

## 2019-01-01 DIAGNOSIS — I1310 Hypertensive heart and chronic kidney disease without heart failure, with stage 1 through stage 4 chronic kidney disease, or unspecified chronic kidney disease: Secondary | ICD-10-CM | POA: Diagnosis not present

## 2019-01-01 DIAGNOSIS — F0150 Vascular dementia without behavioral disturbance: Secondary | ICD-10-CM | POA: Diagnosis not present

## 2019-01-01 DIAGNOSIS — G9340 Encephalopathy, unspecified: Secondary | ICD-10-CM | POA: Diagnosis not present

## 2019-01-01 DIAGNOSIS — U071 COVID-19: Secondary | ICD-10-CM | POA: Diagnosis not present

## 2019-01-05 DIAGNOSIS — I1310 Hypertensive heart and chronic kidney disease without heart failure, with stage 1 through stage 4 chronic kidney disease, or unspecified chronic kidney disease: Secondary | ICD-10-CM | POA: Diagnosis not present

## 2019-01-05 DIAGNOSIS — I69351 Hemiplegia and hemiparesis following cerebral infarction affecting right dominant side: Secondary | ICD-10-CM | POA: Diagnosis not present

## 2019-01-05 DIAGNOSIS — G9340 Encephalopathy, unspecified: Secondary | ICD-10-CM | POA: Diagnosis not present

## 2019-01-05 DIAGNOSIS — U071 COVID-19: Secondary | ICD-10-CM | POA: Diagnosis not present

## 2019-01-05 DIAGNOSIS — E860 Dehydration: Secondary | ICD-10-CM | POA: Diagnosis not present

## 2019-01-05 DIAGNOSIS — F0150 Vascular dementia without behavioral disturbance: Secondary | ICD-10-CM | POA: Diagnosis not present

## 2019-01-06 DIAGNOSIS — U071 COVID-19: Secondary | ICD-10-CM | POA: Diagnosis not present

## 2019-01-06 DIAGNOSIS — F0150 Vascular dementia without behavioral disturbance: Secondary | ICD-10-CM | POA: Diagnosis not present

## 2019-01-06 DIAGNOSIS — I69351 Hemiplegia and hemiparesis following cerebral infarction affecting right dominant side: Secondary | ICD-10-CM | POA: Diagnosis not present

## 2019-01-06 DIAGNOSIS — I1310 Hypertensive heart and chronic kidney disease without heart failure, with stage 1 through stage 4 chronic kidney disease, or unspecified chronic kidney disease: Secondary | ICD-10-CM | POA: Diagnosis not present

## 2019-01-06 DIAGNOSIS — E860 Dehydration: Secondary | ICD-10-CM | POA: Diagnosis not present

## 2019-01-06 DIAGNOSIS — G9340 Encephalopathy, unspecified: Secondary | ICD-10-CM | POA: Diagnosis not present

## 2019-01-08 DIAGNOSIS — F0150 Vascular dementia without behavioral disturbance: Secondary | ICD-10-CM | POA: Diagnosis not present

## 2019-01-08 DIAGNOSIS — I69351 Hemiplegia and hemiparesis following cerebral infarction affecting right dominant side: Secondary | ICD-10-CM | POA: Diagnosis not present

## 2019-01-08 DIAGNOSIS — G9340 Encephalopathy, unspecified: Secondary | ICD-10-CM | POA: Diagnosis not present

## 2019-01-08 DIAGNOSIS — U071 COVID-19: Secondary | ICD-10-CM | POA: Diagnosis not present

## 2019-01-08 DIAGNOSIS — E860 Dehydration: Secondary | ICD-10-CM | POA: Diagnosis not present

## 2019-01-08 DIAGNOSIS — I1310 Hypertensive heart and chronic kidney disease without heart failure, with stage 1 through stage 4 chronic kidney disease, or unspecified chronic kidney disease: Secondary | ICD-10-CM | POA: Diagnosis not present

## 2019-01-09 DIAGNOSIS — E860 Dehydration: Secondary | ICD-10-CM | POA: Diagnosis not present

## 2019-01-09 DIAGNOSIS — U071 COVID-19: Secondary | ICD-10-CM | POA: Diagnosis not present

## 2019-01-09 DIAGNOSIS — G9340 Encephalopathy, unspecified: Secondary | ICD-10-CM | POA: Diagnosis not present

## 2019-01-09 DIAGNOSIS — I1310 Hypertensive heart and chronic kidney disease without heart failure, with stage 1 through stage 4 chronic kidney disease, or unspecified chronic kidney disease: Secondary | ICD-10-CM | POA: Diagnosis not present

## 2019-01-09 DIAGNOSIS — F0150 Vascular dementia without behavioral disturbance: Secondary | ICD-10-CM | POA: Diagnosis not present

## 2019-01-09 DIAGNOSIS — I69351 Hemiplegia and hemiparesis following cerebral infarction affecting right dominant side: Secondary | ICD-10-CM | POA: Diagnosis not present

## 2019-01-13 DIAGNOSIS — G9340 Encephalopathy, unspecified: Secondary | ICD-10-CM | POA: Diagnosis not present

## 2019-01-13 DIAGNOSIS — F0150 Vascular dementia without behavioral disturbance: Secondary | ICD-10-CM | POA: Diagnosis not present

## 2019-01-13 DIAGNOSIS — E860 Dehydration: Secondary | ICD-10-CM | POA: Diagnosis not present

## 2019-01-13 DIAGNOSIS — I1310 Hypertensive heart and chronic kidney disease without heart failure, with stage 1 through stage 4 chronic kidney disease, or unspecified chronic kidney disease: Secondary | ICD-10-CM | POA: Diagnosis not present

## 2019-01-13 DIAGNOSIS — I69351 Hemiplegia and hemiparesis following cerebral infarction affecting right dominant side: Secondary | ICD-10-CM | POA: Diagnosis not present

## 2019-01-13 DIAGNOSIS — U071 COVID-19: Secondary | ICD-10-CM | POA: Diagnosis not present

## 2019-01-16 DIAGNOSIS — I69351 Hemiplegia and hemiparesis following cerebral infarction affecting right dominant side: Secondary | ICD-10-CM | POA: Diagnosis not present

## 2019-01-16 DIAGNOSIS — F0150 Vascular dementia without behavioral disturbance: Secondary | ICD-10-CM | POA: Diagnosis not present

## 2019-01-16 DIAGNOSIS — U071 COVID-19: Secondary | ICD-10-CM | POA: Diagnosis not present

## 2019-01-16 DIAGNOSIS — G9340 Encephalopathy, unspecified: Secondary | ICD-10-CM | POA: Diagnosis not present

## 2019-01-16 DIAGNOSIS — E860 Dehydration: Secondary | ICD-10-CM | POA: Diagnosis not present

## 2019-01-16 DIAGNOSIS — I1310 Hypertensive heart and chronic kidney disease without heart failure, with stage 1 through stage 4 chronic kidney disease, or unspecified chronic kidney disease: Secondary | ICD-10-CM | POA: Diagnosis not present

## 2019-02-16 DIAGNOSIS — E1165 Type 2 diabetes mellitus with hyperglycemia: Secondary | ICD-10-CM | POA: Diagnosis not present

## 2019-02-16 DIAGNOSIS — N183 Chronic kidney disease, stage 3 unspecified: Secondary | ICD-10-CM | POA: Diagnosis not present

## 2019-03-19 DIAGNOSIS — I10 Essential (primary) hypertension: Secondary | ICD-10-CM | POA: Diagnosis not present

## 2019-03-19 DIAGNOSIS — E1165 Type 2 diabetes mellitus with hyperglycemia: Secondary | ICD-10-CM | POA: Diagnosis not present

## 2019-03-23 ENCOUNTER — Encounter (HOSPITAL_COMMUNITY): Payer: Self-pay

## 2019-03-23 ENCOUNTER — Emergency Department (HOSPITAL_COMMUNITY): Payer: Medicare Other

## 2019-03-23 ENCOUNTER — Inpatient Hospital Stay (HOSPITAL_COMMUNITY)
Admission: EM | Admit: 2019-03-23 | Discharge: 2019-03-25 | DRG: 690 | Disposition: A | Discharge status: Home Health | Payer: Medicare Other | Attending: Family Medicine | Admitting: Family Medicine

## 2019-03-23 ENCOUNTER — Other Ambulatory Visit: Payer: Self-pay

## 2019-03-23 DIAGNOSIS — N3001 Acute cystitis with hematuria: Principal | ICD-10-CM | POA: Diagnosis present

## 2019-03-23 DIAGNOSIS — Z7982 Long term (current) use of aspirin: Secondary | ICD-10-CM

## 2019-03-23 DIAGNOSIS — R81 Glycosuria: Secondary | ICD-10-CM | POA: Diagnosis present

## 2019-03-23 DIAGNOSIS — Z87440 Personal history of urinary (tract) infections: Secondary | ICD-10-CM

## 2019-03-23 DIAGNOSIS — E1122 Type 2 diabetes mellitus with diabetic chronic kidney disease: Secondary | ICD-10-CM | POA: Diagnosis present

## 2019-03-23 DIAGNOSIS — B961 Klebsiella pneumoniae [K. pneumoniae] as the cause of diseases classified elsewhere: Secondary | ICD-10-CM | POA: Diagnosis present

## 2019-03-23 DIAGNOSIS — N400 Enlarged prostate without lower urinary tract symptoms: Secondary | ICD-10-CM | POA: Diagnosis present

## 2019-03-23 DIAGNOSIS — E785 Hyperlipidemia, unspecified: Secondary | ICD-10-CM | POA: Diagnosis present

## 2019-03-23 DIAGNOSIS — I129 Hypertensive chronic kidney disease with stage 1 through stage 4 chronic kidney disease, or unspecified chronic kidney disease: Secondary | ICD-10-CM | POA: Diagnosis present

## 2019-03-23 DIAGNOSIS — N390 Urinary tract infection, site not specified: Secondary | ICD-10-CM | POA: Diagnosis present

## 2019-03-23 DIAGNOSIS — Z8616 Personal history of COVID-19: Secondary | ICD-10-CM | POA: Diagnosis not present

## 2019-03-23 DIAGNOSIS — I69351 Hemiplegia and hemiparesis following cerebral infarction affecting right dominant side: Secondary | ICD-10-CM

## 2019-03-23 DIAGNOSIS — Z20822 Contact with and (suspected) exposure to covid-19: Secondary | ICD-10-CM | POA: Diagnosis not present

## 2019-03-23 DIAGNOSIS — B9689 Other specified bacterial agents as the cause of diseases classified elsewhere: Secondary | ICD-10-CM | POA: Diagnosis not present

## 2019-03-23 DIAGNOSIS — F0150 Vascular dementia without behavioral disturbance: Secondary | ICD-10-CM | POA: Diagnosis present

## 2019-03-23 DIAGNOSIS — E860 Dehydration: Secondary | ICD-10-CM | POA: Diagnosis present

## 2019-03-23 DIAGNOSIS — Z23 Encounter for immunization: Secondary | ICD-10-CM

## 2019-03-23 DIAGNOSIS — N1832 Chronic kidney disease, stage 3b: Secondary | ICD-10-CM | POA: Diagnosis present

## 2019-03-23 DIAGNOSIS — Z87891 Personal history of nicotine dependence: Secondary | ICD-10-CM | POA: Diagnosis not present

## 2019-03-23 DIAGNOSIS — Z7401 Bed confinement status: Secondary | ICD-10-CM | POA: Diagnosis not present

## 2019-03-23 DIAGNOSIS — Z7984 Long term (current) use of oral hypoglycemic drugs: Secondary | ICD-10-CM | POA: Diagnosis not present

## 2019-03-23 DIAGNOSIS — I10 Essential (primary) hypertension: Secondary | ICD-10-CM | POA: Diagnosis present

## 2019-03-23 DIAGNOSIS — I69354 Hemiplegia and hemiparesis following cerebral infarction affecting left non-dominant side: Secondary | ICD-10-CM

## 2019-03-23 DIAGNOSIS — R5381 Other malaise: Secondary | ICD-10-CM | POA: Diagnosis not present

## 2019-03-23 DIAGNOSIS — K5900 Constipation, unspecified: Secondary | ICD-10-CM | POA: Diagnosis present

## 2019-03-23 DIAGNOSIS — N183 Chronic kidney disease, stage 3 unspecified: Secondary | ICD-10-CM | POA: Diagnosis present

## 2019-03-23 DIAGNOSIS — Z79899 Other long term (current) drug therapy: Secondary | ICD-10-CM | POA: Diagnosis not present

## 2019-03-23 DIAGNOSIS — I441 Atrioventricular block, second degree: Secondary | ICD-10-CM | POA: Diagnosis present

## 2019-03-23 DIAGNOSIS — R52 Pain, unspecified: Secondary | ICD-10-CM | POA: Diagnosis not present

## 2019-03-23 DIAGNOSIS — R531 Weakness: Secondary | ICD-10-CM | POA: Diagnosis not present

## 2019-03-23 DIAGNOSIS — F329 Major depressive disorder, single episode, unspecified: Secondary | ICD-10-CM | POA: Diagnosis present

## 2019-03-23 DIAGNOSIS — E1165 Type 2 diabetes mellitus with hyperglycemia: Secondary | ICD-10-CM | POA: Diagnosis not present

## 2019-03-23 LAB — PHOSPHORUS: Phosphorus: 2.7 mg/dL (ref 2.5–4.6)

## 2019-03-23 LAB — URINALYSIS, ROUTINE W REFLEX MICROSCOPIC
Bilirubin Urine: NEGATIVE
Glucose, UA: 50 mg/dL — AB
Ketones, ur: NEGATIVE mg/dL
Nitrite: NEGATIVE
Protein, ur: 100 mg/dL — AB
Specific Gravity, Urine: 1.015 (ref 1.005–1.030)
WBC, UA: >50 WBC/hpf — ABNORMAL HIGH (ref 0–5)
pH: 5 (ref 5.0–8.0)

## 2019-03-23 LAB — CBC WITH DIFFERENTIAL/PLATELET
Abs Immature Granulocytes: 0.05 10*3/uL (ref 0.00–0.07)
Basophils Absolute: 0 10*3/uL (ref 0.0–0.1)
Basophils Relative: 0 %
Eosinophils Absolute: 0.1 10*3/uL (ref 0.0–0.5)
Eosinophils Relative: 1 %
HCT: 37.8 % — ABNORMAL LOW (ref 39.0–52.0)
Hemoglobin: 11.8 g/dL — ABNORMAL LOW (ref 13.0–17.0)
Immature Granulocytes: 1 %
Lymphocytes Relative: 20 %
Lymphs Abs: 2.2 10*3/uL (ref 0.7–4.0)
MCH: 31 pg (ref 26.0–34.0)
MCHC: 31.2 g/dL (ref 30.0–36.0)
MCV: 99.2 fL (ref 80.0–100.0)
Monocytes Absolute: 1 10*3/uL (ref 0.1–1.0)
Monocytes Relative: 9 %
Neutro Abs: 7.6 10*3/uL (ref 1.7–7.7)
Neutrophils Relative %: 69 %
Platelets: 191 10*3/uL (ref 150–400)
RBC: 3.81 MIL/uL — ABNORMAL LOW (ref 4.22–5.81)
RDW: 12.8 % (ref 11.5–15.5)
WBC: 11 10*3/uL — ABNORMAL HIGH (ref 4.0–10.5)
nRBC: 0 % (ref 0.0–0.2)

## 2019-03-23 LAB — COMPREHENSIVE METABOLIC PANEL
ALT: 12 U/L (ref 0–44)
AST: 14 U/L — ABNORMAL LOW (ref 15–41)
Albumin: 3.1 g/dL — ABNORMAL LOW (ref 3.5–5.0)
Alkaline Phosphatase: 122 U/L (ref 38–126)
Anion gap: 8 (ref 5–15)
BUN: 23 mg/dL (ref 8–23)
CO2: 25 mmol/L (ref 22–32)
Calcium: 8.4 mg/dL — ABNORMAL LOW (ref 8.9–10.3)
Chloride: 106 mmol/L (ref 98–111)
Creatinine, Ser: 1.29 mg/dL — ABNORMAL HIGH (ref 0.61–1.24)
GFR calc Af Amer: 57 mL/min — ABNORMAL LOW (ref >60)
GFR calc non Af Amer: 49 mL/min — ABNORMAL LOW (ref >60)
Glucose, Bld: 175 mg/dL — ABNORMAL HIGH (ref 70–99)
Potassium: 4 mmol/L (ref 3.5–5.1)
Sodium: 139 mmol/L (ref 135–145)
Total Bilirubin: 0.7 mg/dL (ref 0.3–1.2)
Total Protein: 6.7 g/dL (ref 6.5–8.1)

## 2019-03-23 LAB — GLUCOSE, CAPILLARY: Glucose-Capillary: 117 mg/dL — ABNORMAL HIGH (ref 70–99)

## 2019-03-23 LAB — MAGNESIUM: Magnesium: 1.3 mg/dL — ABNORMAL LOW (ref 1.7–2.4)

## 2019-03-23 MED ORDER — METFORMIN HCL 500 MG PO TABS
500.0000 mg | ORAL_TABLET | Freq: Two times a day (BID) | ORAL | Status: DC
  Administered 2019-03-24 – 2019-03-25 (×3): 500 mg via ORAL
  Filled 2019-03-23 (×3): qty 1

## 2019-03-23 MED ORDER — ASPIRIN 81 MG PO CHEW
81.0000 mg | CHEWABLE_TABLET | Freq: Every day | ORAL | Status: DC
  Administered 2019-03-24 – 2019-03-25 (×2): 81 mg via ORAL
  Filled 2019-03-23 (×2): qty 1

## 2019-03-23 MED ORDER — PNEUMOCOCCAL VAC POLYVALENT 25 MCG/0.5ML IJ INJ
0.5000 mL | INJECTION | INTRAMUSCULAR | Status: AC
  Administered 2019-03-25: 0.5 mL via INTRAMUSCULAR
  Filled 2019-03-23: qty 0.5

## 2019-03-23 MED ORDER — SODIUM CHLORIDE 0.9 % IV SOLN
1.0000 g | Freq: Once | INTRAVENOUS | Status: AC
  Administered 2019-03-23: 1 g via INTRAVENOUS
  Filled 2019-03-23: qty 10

## 2019-03-23 MED ORDER — PROCHLORPERAZINE EDISYLATE 10 MG/2ML IJ SOLN: 5.0000 mg | Freq: Four times a day (QID) | INTRAMUSCULAR | Status: DC | PRN

## 2019-03-23 MED ORDER — ZINC SULFATE 220 (50 ZN) MG PO CAPS
220.0000 mg | ORAL_CAPSULE | Freq: Every day | ORAL | Status: DC
  Administered 2019-03-24 – 2019-03-25 (×2): 220 mg via ORAL
  Filled 2019-03-23 (×2): qty 1

## 2019-03-23 MED ORDER — ASCORBIC ACID 500 MG PO TABS
500.0000 mg | ORAL_TABLET | Freq: Every day | ORAL | Status: DC
  Administered 2019-03-24 – 2019-03-25 (×2): 500 mg via ORAL
  Filled 2019-03-23 (×2): qty 1

## 2019-03-23 MED ORDER — ACETAMINOPHEN 325 MG PO TABS: 650.0000 mg | ORAL_TABLET | Freq: Four times a day (QID) | ORAL | Status: DC | PRN

## 2019-03-23 MED ORDER — ENOXAPARIN SODIUM 40 MG/0.4ML ~~LOC~~ SOLN
40.0000 mg | SUBCUTANEOUS | Status: DC
  Administered 2019-03-23 – 2019-03-24 (×2): 40 mg via SUBCUTANEOUS
  Filled 2019-03-23 (×2): qty 0.4

## 2019-03-23 MED ORDER — ATORVASTATIN CALCIUM 10 MG PO TABS
10.0000 mg | ORAL_TABLET | Freq: Every evening | ORAL | Status: DC
  Administered 2019-03-23 – 2019-03-24 (×2): 10 mg via ORAL
  Filled 2019-03-23 (×2): qty 1

## 2019-03-23 MED ORDER — INSULIN ASPART 100 UNIT/ML ~~LOC~~ SOLN
0.0000 [IU] | Freq: Three times a day (TID) | SUBCUTANEOUS | Status: DC
  Administered 2019-03-24 (×3): 1 [IU] via SUBCUTANEOUS
  Administered 2019-03-25: 2 [IU] via SUBCUTANEOUS

## 2019-03-23 MED ORDER — MAGNESIUM SULFATE 2 GM/50ML IV SOLN
2.0000 g | Freq: Once | INTRAVENOUS | Status: AC
  Administered 2019-03-23: 2 g via INTRAVENOUS
  Filled 2019-03-23: qty 50

## 2019-03-23 MED ORDER — TAMSULOSIN HCL 0.4 MG PO CAPS
0.4000 mg | ORAL_CAPSULE | Freq: Every day | ORAL | Status: DC
  Administered 2019-03-24 – 2019-03-25 (×2): 0.4 mg via ORAL
  Filled 2019-03-23 (×2): qty 1

## 2019-03-23 MED ORDER — ACETAMINOPHEN 650 MG RE SUPP: 650.0000 mg | Freq: Four times a day (QID) | RECTAL | Status: DC | PRN

## 2019-03-23 MED ORDER — COLCHICINE 0.6 MG PO TABS
0.6000 mg | ORAL_TABLET | Freq: Every morning | ORAL | Status: DC
  Administered 2019-03-24 – 2019-03-25 (×2): 0.6 mg via ORAL
  Filled 2019-03-23 (×2): qty 1

## 2019-03-23 MED ORDER — LISINOPRIL 10 MG PO TABS
40.0000 mg | ORAL_TABLET | Freq: Every day | ORAL | Status: DC
  Administered 2019-03-24 – 2019-03-25 (×2): 40 mg via ORAL
  Filled 2019-03-23 (×2): qty 4

## 2019-03-23 MED ORDER — MEMANTINE HCL 10 MG PO TABS
10.0000 mg | ORAL_TABLET | Freq: Two times a day (BID) | ORAL | Status: DC
  Administered 2019-03-23 – 2019-03-25 (×4): 10 mg via ORAL
  Filled 2019-03-23 (×4): qty 1

## 2019-03-23 NOTE — ED Triage Notes (Signed)
Pt's aid from home called EMS for evaluation of weakness, painful urination and a wound on scrotum. No reports of fever. CBG 156

## 2019-03-23 NOTE — ED Provider Notes (Signed)
[facility] EMERGENCY DEPARTMENT Provider Note   CSN: [id number] Arrival date & time: 03/23/19  1310     History Chief Complaint  Patient presents with  . Weakness    Mike Briggs is a 86 y.o. male.  HPI   Pt is an 86 y/o male with a h/o COVID, CKD, DM, HLD, HTN, rhabdomyolisis, CVA, mobitz type 1 AV block, UTI, dementia, who presents to the ED today for eval of weakness and painful urination.  Patient has had some dysuria for an unknown period of time.  He also states "my legs do not work ".  He states this has been an issue for many years and is unchanged today.  He denies any chest pain, shortness of breath, abdominal pain, fevers, nausea, vomiting, diarrhea.  He is reporting some constipation.  Reviewed records, pt has had frequent admissions for UTIs in the past. Most recent urine culture 12/17/2018 showed klebsiella UTI.   Past Medical History:  Diagnosis Date  . Arthritis   . Cataract   . Chronic kidney disease   . COVID-19   . Depression   . Diabetes mellitus   . Hyperlipidemia   . Hypertension   . Rhabdomyolysis 11/02/2018  . Stroke [facility])     Patient Active Problem List   Diagnosis Date Noted  . Acute UTI 03/23/2019  . Acute cystitis 12/17/2018  . Mobitz type 1 second degree atrioventricular block   . TIA (transient ischemic attack) 11/14/2018  . Acute encephalopathy 11/14/2018  . Sacral decubitus ulcer, stage III ([place]) 11/11/2018  . Pressure injury of skin 11/08/2018  . COVID-19 virus infection 11/03/2018  . COVID-19 virus detected 11/03/2018  . Gram-positive bacteremia 11/03/2018  . Dehydration 11/02/2018  . Rhabdomyolysis 11/02/2018  . UTI (urinary tract infection) 02/26/2018  . Syncope 02/25/2018  . Vitamin D deficiency 05/31/2016  . Essential hypertension 05/22/2016  . Hemiparesis affecting right side as late effect of cerebrovascular accident (CVA) ([place]) 05/22/2016  . Personal history of gout 05/22/2016  . BPH (benign prostatic hyperplasia)  05/22/2016  . Chronic constipation 05/22/2016  . HLD (hyperlipidemia) 05/22/2016  . Pseudophakia 12/06/2015  . Vascular dementia ([place]) 10/23/2012  . Chronic kidney disease, stage III (moderate) ([place]) 10/20/2012  . Type 2 diabetes mellitus with diabetic chronic kidney disease ([place]) 12/16/2008  . ANKLE, ARTHRITIS, DEGEN./OSTEO 12/16/2008    Past Surgical History:  Procedure Laterality Date  . BACK SURGERY     cervical and lumbar  . CATARACT EXTRACTION    . CHOLECYSTECTOMY    . FOOT SURGERY     5th toe   . SPINE SURGERY     lumbar and cervical fusions       Family History  Family history unknown: Yes    Social History   Tobacco Use  . Smoking status: Former Smoker    Quit date: 08/07/1967    Years since quitting: 51.6  . Smokeless tobacco: Never Used  Substance Use Topics  . Alcohol use: No  . Drug use: No    Home Medications Prior to Admission medications   Medication Sig Start Date End Date Taking? Authorizing Provider  aspirin 81 MG chewable tablet Chew 1 tablet (81 mg total) by mouth daily. 11/19/18  Yes Eugenie Filler, MD  atorvastatin (LIPITOR) 10 MG tablet TAKE 1 TABLET BY MOUTH AFTER SUPPER. Patient taking differently: Take 10 mg by mouth every evening.  08/07/17  Yes Hagler, Apolonio Schneiders, MD  colchicine 0.6 MG tablet Take 1 tablet (0.6 mg total) by mouth  daily. Patient taking differently: Take 0.6 mg by mouth every morning.  01/10/18  Yes Julianne Rice, MD  lisinopril (ZESTRIL) 40 MG tablet Take 40 mg by mouth daily. 03/02/19  Yes [provider]  memantine (NAMENDA) 10 MG tablet Take 10 mg by mouth 2 (two) times daily.  04/18/17  Yes [provider]  metFORMIN (GLUCOPHAGE) 500 MG tablet Take 1 tablet (500 mg total) by mouth 2 (two) times daily with a meal. 11/10/18 11/10/19 Yes Charlynne Cousins, MD  metoprolol succinate (TOPROL-XL) 25 MG 24 hr tablet Take 25 mg by mouth daily. 03/02/19  Yes [provider]  tamsulosin (FLOMAX) 0.4 MG  CAPS capsule Take 1 capsule (0.4 mg total) by mouth daily after breakfast. 02/28/18  Yes Johnson, Clanford L, MD  vitamin C (VITAMIN C) 500 MG tablet Take 1 tablet (500 mg total) by mouth daily. 11/19/18  Yes Eugenie Filler, MD  zinc sulfate 220 (50 Zn) MG capsule Take 1 capsule (220 mg total) by mouth daily. 11/19/18  Yes Eugenie Filler, MD  amLODipine (NORVASC) 10 MG tablet Take 1 tablet (10 mg total) by mouth daily. Patient not taking: Reported on 11/14/2018 11/10/18   Charlynne Cousins, MD    Allergies    Patient has no known allergies.  Review of Systems   Review of Systems  Constitutional: Negative for chills and fever.  HENT: Negative for ear pain and sore throat.   Eyes: Negative for visual disturbance.  Respiratory: Negative for cough and shortness of breath.   Cardiovascular: Negative for chest pain.  Gastrointestinal: Negative for abdominal pain, constipation, diarrhea, nausea and vomiting.  Genitourinary: Positive for dysuria. Negative for hematuria.  Musculoskeletal: Negative for back pain.  Skin: Negative for color change and rash.  Neurological: Positive for weakness. Negative for headaches.  All other systems reviewed and are negative.   Physical Exam Updated Vital Signs BP (!) 156/88 (BP Location: Left Arm)   Pulse 85   Temp 98.3 F (36.8 C) (Rectal)   Resp 13   SpO2 98%   Physical Exam Vitals and nursing note reviewed.  Constitutional:      Appearance: He is well-developed.  HENT:     Head: Normocephalic and atraumatic.  Eyes:     Conjunctiva/sclera: Conjunctivae normal.  Cardiovascular:     Rate and Rhythm: Normal rate and regular rhythm.     Pulses: Normal pulses.     Heart sounds: Normal heart sounds. No murmur.  Pulmonary:     Effort: Pulmonary effort is normal. No respiratory distress.     Breath sounds: Normal breath sounds. No wheezing, rhonchi or rales.  Abdominal:     General: Bowel sounds are normal.     Palpations: Abdomen is  soft.     Tenderness: There is no abdominal tenderness. There is no guarding or rebound.  Genitourinary:    Comments: Chaperone present. No TTP to the scrotum. No erythema, induration, fluctuance, or indication of infection. Penis is uncircumcised and appears wnl.  Musculoskeletal:     Cervical back: Neck supple.  Skin:    General: Skin is warm and dry.  Neurological:     Mental Status: He is alert.     Comments: Mental Status:  Alert, thought content appropriate, able to give a coherent history. Speech fluent without evidence of aphasia. Able to follow 2 step commands without difficulty.  Cranial Nerves:  II: pupils equal, round, reactive to light III,IV, VI: ptosis not present, extra-ocular motions intact bilaterally  V,VII:  smile symmetric, facial light touch sensation equal VIII: hearing grossly normal to voice  X: uvula elevates symmetrically  XI: bilateral shoulder shrug symmetric and strong XII: midline tongue extension without fassiculations Motor:  Normal tone. Globally weak but strength symmeptric of BUE and BLE major muscle groups including strong and equal grip strength and dorsiflexion/plantar flexion Sensory: light touch normal in all extremities.     ED Results / Procedures / Treatments   Labs (all labs ordered are listed, but only abnormal results are displayed) Labs Reviewed  CBC WITH DIFFERENTIAL/PLATELET - Abnormal; Notable for the following components:      Result Value   WBC 11.0 (*)    RBC 3.81 (*)    Hemoglobin 11.8 (*)    HCT 37.8 (*)    All other components within normal limits  COMPREHENSIVE METABOLIC PANEL - Abnormal; Notable for the following components:   Glucose, Bld 175 (*)    Creatinine, Ser 1.29 (*)    Calcium 8.4 (*)    Albumin 3.1 (*)    AST 14 (*)    GFR calc non Af Amer 49 (*)    GFR calc Af Amer 57 (*)    All other components within normal limits  URINALYSIS, ROUTINE W REFLEX MICROSCOPIC - Abnormal; Notable for the following  components:   APPearance HAZY (*)    Glucose, UA 50 (*)    Hgb urine dipstick SMALL (*)    Protein, ur 100 (*)    Leukocytes,Ua MODERATE (*)    WBC, UA >50 (*)    Bacteria, UA FEW (*)    All other components within normal limits  URINE CULTURE  SARS CORONAVIRUS 2 (TAT 6-24 HRS)    EKG EKG Interpretation  Date/Time:  Monday March 23 2019 15:12:44 EST Ventricular Rate:  83 PR Interval:    QRS Duration: 82 QT Interval:  360 QTC Calculation: 423 R Axis:   -11 Text Interpretation: Second degree AV block, Mobitz I with Mobitz I (Wenckebach) block Low voltage, precordial leads Left ventricular hypertrophy Tall R wave in V2, consider RVH or PMI Borderline T abnormalities, inferior leads Confirmed by Ezequiel Essex [phone]) on 03/23/2019 3:23:42 PM   Radiology DG Chest Portable 1 View  Result Date: 03/23/2019 CLINICAL DATA:  Generalized weakness EXAM: PORTABLE CHEST 1 VIEW COMPARISON:  12/17/2018 FINDINGS: The heart size and mediastinal contours are stable. No focal airspace consolidation, pleural effusion, or pneumothorax. The visualized skeletal structures are unremarkable. IMPRESSION: No active disease. Electronically Signed   By: Davina Poke D.O.   On: 03/23/2019 14:43    Procedures Procedures (including critical care time)  Medications Ordered in ED Medications  cefTRIAXone (ROCEPHIN) 1 g in sodium chloride 0.9 % 100 mL IVPB (0 g Intravenous Stopped 03/23/19 1614)    ED Course  I have reviewed the triage vital signs and the nursing notes.  Pertinent labs & imaging results that were available during my care of the patient were reviewed by me and considered in my medical decision making (see chart for details).    MDM Rules/Calculators/A&P                      87 year old male presenting for evaluation of weakness and dysuria.  Frequent history of UTIs.  Initial vital signs are reassuring.  Will obtain labs and UA. CBC with leukocytosis at 11,000, anemia present  but stable from prior CMP with slightly elevated creatinine compared to prior, normal LFTs.  No gross electrolyte derangement UA with  hematuria, proteinuria, moderate leukocytes, 11-20 RBCs, greater than 50 WBCs and few bacteria.  Consistent with UTI.  Urine culture sent and patient treated with ceftriaxone.  EKG as noted above  CXR with mild cardiomegaly other wise reassuring, personally reviewed and I agree with the radiologist  Pt was seen in conjunction with Dr. Wyvonnia Dusky who personally evaluated the patient. He recommended repeating EKG and calling cards due to concern for possible intermittent mobitz 2.   6:36 PM CONSULT with Dr. Debara Pickett with cardiology who reviewed the EKGs.  He states that the patient is likely in normal sinus rhythm with first-degree AV block with some possible PVCs/PACs.  He has low concern for Mobitz 2 or other concerning heart block at this time. He did recommend stopping beta-blocker and optimizing the patient's electrolytes.  Pt with recurrent UTI. With patients advanced age and comorbidities, feel he would benefit from admission for further tx of his UTI.  He was given a dose of ceftriaxone in the ED.  7:36 PM CONSULT with Dr. Olevia Bowens who accepts patient for admission  Final Clinical Impression(s) / ED Diagnoses Final diagnoses:  None    Rx / DC Orders ED Discharge Orders    None       Bishop Dublin 03/23/19 1940    Ezequiel Essex, MD 03/24/19 1009

## 2019-03-23 NOTE — H&P (Signed)
History and Physical    Mike Briggs [id number] DOB: [birth year] DOA: 03/23/2019  PCP: Rosita Fire, MD   Patient coming from: Home.  I have personally briefly reviewed patient's old medical records in [place]  Chief Complaint: Weakness.  HPI: Mike Briggs is a 86 y.o. male with medical history significant of osteoarthritis, cataracts, chronic kidney disease, history of COVID-19 infection, depression, type 2 diabetes, hyperlipidemia, hypertension, history of ischemic CVA with residual left-sided hemiparesis, history of rhabdomyolysis who is coming to the emergency department with complaints of weakness and lower abdominal pain.  No dysuria, hematuria or oliguria.  He denies fever, chills, night sweats, but has decreased appetite, weakness and malaise.  Denies dyspnea, wheezing, hemoptysis, chest pain, dizziness, orthopnea or lower extremity edema.  No nausea, vomiting, diarrhea, constipation, melena or hematochezia.  Denies polyuria, polydipsia, polyphagia or blurred vision.  ED Course: Initial vital signs temperature 99.4 F, pulse 96, respirations 20, blood pressure 139/79 mmHg and O2 sat 98% on room air.  The patient received ceftriaxone in the emergency department.  Urinalysis shows glucosuria of 50 and proteinuria 100 mg/dL.  Small hemoglobinuria, moderate leukocyte esterase.  Microscopic examination shows 11-20 RBC, more than 50 WBC and a few bacteria per hpf.  White count is 11.0 69% neutrophils, hemoglobin 11.8 g/dL and platelets 191.  CMP shows normal electrolytes when calcium is corrected to albumin.  Glucose under 75, BUN 23 and creatinine 1.29 mg/dL.  Albumin is 3.1 AST 43.  The rest of the hepatic functions are within normal limits.  Phosphorus was 2.7 and INR 1.3 mg/dL.  Review of Systems: As per HPI otherwise 10 point review of systems negative.   Past Medical History:  Diagnosis Date  . Arthritis   . Cataract   . Chronic kidney disease   . COVID-19   .  Depression   . Diabetes mellitus   . Hyperlipidemia   . Hypertension   . Rhabdomyolysis 11/02/2018  . Stroke [facility])     Past Surgical History:  Procedure Laterality Date  . BACK SURGERY     cervical and lumbar  . CATARACT EXTRACTION    . CHOLECYSTECTOMY    . FOOT SURGERY     5th toe   . SPINE SURGERY     lumbar and cervical fusions     reports that he quit smoking about 51 years ago. He has never used smokeless tobacco. He reports that he does not drink alcohol or use drugs.  No Known Allergies  Family History  Family history unknown: Yes   Prior to Admission medications   Medication Sig Start Date End Date Taking? Authorizing Provider  aspirin 81 MG chewable tablet Chew 1 tablet (81 mg total) by mouth daily. 11/19/18  Yes Eugenie Filler, MD  atorvastatin (LIPITOR) 10 MG tablet TAKE 1 TABLET BY MOUTH AFTER SUPPER. Patient taking differently: Take 10 mg by mouth every evening.  08/07/17  Yes Hagler, Apolonio Schneiders, MD  colchicine 0.6 MG tablet Take 1 tablet (0.6 mg total) by mouth daily. Patient taking differently: Take 0.6 mg by mouth every morning.  01/10/18  Yes Julianne Rice, MD  lisinopril (ZESTRIL) 40 MG tablet Take 40 mg by mouth daily. 03/02/19  Yes [provider]  memantine (NAMENDA) 10 MG tablet Take 10 mg by mouth 2 (two) times daily.  04/18/17  Yes [provider]  metFORMIN (GLUCOPHAGE) 500 MG tablet Take 1 tablet (500 mg total) by mouth 2 (two) times daily with a meal. 11/10/18 11/10/19  Yes Charlynne Cousins, MD  metoprolol succinate (TOPROL-XL) 25 MG 24 hr tablet Take 25 mg by mouth daily. 03/02/19  Yes [provider]  tamsulosin (FLOMAX) 0.4 MG CAPS capsule Take 1 capsule (0.4 mg total) by mouth daily after breakfast. 02/28/18  Yes Johnson, Clanford L, MD  vitamin C (VITAMIN C) 500 MG tablet Take 1 tablet (500 mg total) by mouth daily. 11/19/18  Yes Eugenie Filler, MD  zinc sulfate 220 (50 Zn) MG capsule Take 1 capsule (220 mg total) by  mouth daily. 11/19/18  Yes Eugenie Filler, MD  amLODipine (NORVASC) 10 MG tablet Take 1 tablet (10 mg total) by mouth daily. Patient not taking: Reported on 11/14/2018 11/10/18   Charlynne Cousins, MD    Physical Exam: Vitals:   03/23/19 1400 03/23/19 1420 03/23/19 1600 03/23/19 2006  BP: (!) 161/76  (!) 156/88 (!) 168/87  Pulse: 94  85 79  Resp: 19  13 15   Temp:  98.3 F (36.8 C)  98 F (36.7 C)  TempSrc:  Rectal  Oral  SpO2: 99%  98% 100%    Constitutional: NAD, calm, comfortable Eyes: PERRL, lids and conjunctivae normal ENMT: Mucous membranes are moist. Posterior pharynx clear of any exudate or lesions. Neck: normal, supple, no masses, no thyromegaly Respiratory: clear to auscultation bilaterally, no wheezing, no crackles. Normal respiratory effort. No accessory muscle use.  Cardiovascular: Regular rate and rhythm, no murmurs / rubs / gallops. No extremity edema. 2+ pedal pulses. No carotid bruits.  Abdomen: Nondistended.  BS positive. Soft, positive suprapubic tenderness, no guarding or rebound, no masses palpated. No hepatosplenomegaly.  Musculoskeletal: Generalized weakness.  No clubbing / cyanosis. Good ROM, no contractures. Normal muscle tone.  Skin: no rashes, lesions, ulcers.  Healed sacral pressure ulcer. Neurologic: CN 2-12 grossly intact. Sensation intact, DTR normal.  Left-sided 3.5/5 hemiparesis. Psychiatric: Alert and oriented x 2, partially oriented to time and situation.. Normal mood.   Labs on Admission: I have personally reviewed following labs and imaging studies  CBC: Recent Labs  Lab 03/23/19 1606  WBC 11.0*  NEUTROABS 7.6  HGB 11.8*  HCT 37.8*  MCV 99.2  PLT 99991111   Basic Metabolic Panel: Recent Labs  Lab 03/23/19 1606  NA 139  K 4.0  CL 106  CO2 25  GLUCOSE 175*  BUN 23  CREATININE 1.29*  CALCIUM 8.4*   GFR: CrCl cannot be calculated (Unknown ideal weight.). Liver Function Tests: Recent Labs  Lab 03/23/19 1606  AST 14*  ALT  12  ALKPHOS 122  BILITOT 0.7  PROT 6.7  ALBUMIN 3.1*   No results for input(s): LIPASE, AMYLASE in the last 168 hours. No results for input(s): AMMONIA in the last 168 hours. Coagulation Profile: No results for input(s): INR, PROTIME in the last 168 hours. Cardiac Enzymes: No results for input(s): CKTOTAL, CKMB, CKMBINDEX, TROPONINI in the last 168 hours. BNP (last 3 results) No results for input(s): PROBNP in the last 8760 hours. HbA1C: No results for input(s): HGBA1C in the last 72 hours. CBG: No results for input(s): GLUCAP in the last 168 hours. Lipid Profile: No results for input(s): CHOL, HDL, LDLCALC, TRIG, CHOLHDL, LDLDIRECT in the last 72 hours. Thyroid Function Tests: No results for input(s): TSH, T4TOTAL, FREET4, T3FREE, THYROIDAB in the last 72 hours. Anemia Panel: No results for input(s): VITAMINB12, FOLATE, FERRITIN, TIBC, IRON, RETICCTPCT in the last 72 hours. Urine analysis:    Component Value Date/Time   COLORURINE YELLOW 03/23/2019 1402   APPEARANCEUR  HAZY (A) 03/23/2019 1402   LABSPEC 1.015 03/23/2019 1402   PHURINE 5.0 03/23/2019 1402   GLUCOSEU 50 (A) 03/23/2019 1402   HGBUR SMALL (A) 03/23/2019 1402   BILIRUBINUR NEGATIVE 03/23/2019 1402   KETONESUR NEGATIVE 03/23/2019 1402   PROTEINUR 100 (A) 03/23/2019 1402   UROBILINOGEN 0.2 03/21/2014 1840   NITRITE NEGATIVE 03/23/2019 1402   LEUKOCYTESUR MODERATE (A) 03/23/2019 1402    Radiological Exams on Admission: DG Chest Portable 1 View  Result Date: 03/23/2019 CLINICAL DATA:  Generalized weakness EXAM: PORTABLE CHEST 1 VIEW COMPARISON:  12/17/2018 FINDINGS: The heart size and mediastinal contours are stable. No focal airspace consolidation, pleural effusion, or pneumothorax. The visualized skeletal structures are unremarkable. IMPRESSION: No active disease. Electronically Signed   By: Davina Poke D.O.   On: 03/23/2019 14:43    EKG: Independently reviewed.  EKG#1 Vent. rate 97 BPM PR interval *  ms QRS duration 81 ms QT/QTc 336/427 ms P-R-T axes -74 -11 -7 Sinus or ectopic atrial rhythm Ventricular premature complex Prolonged PR interval Low voltage, precordial leads Abnormal R-wave progression, early transition Left ventricular hypertrophy Borderline T abnormalities, inferior leads  EKG#2  Vent. rate 83 BPM PR interval * ms QRS duration 82 ms QT/QTc 360/423 ms P-R-T axes 261 -11 -4 Second degree AV block, Mobitz I with Mobitz I (Wenckebach) block Low voltage, precordial leads Left ventricular hypertrophy Tall R wave in V2, consider RVH or PMI Borderline T abnormalities, inferior leads  Assessment/Plan Principal Problem:   Acute UTI Admit to telemetry/inpatient. Continue ceftriaxone 1 g IVPB daily. Analgesics as needed. Follow-up urine culture and sensitivity.  Active Problems:   Type 2 diabetes mellitus with diabetic chronic kidney disease (HCC) carbohydrate modified diet. Continue Metformin 500 mg p.o. twice daily. CBG monitoring before meals and bedtime.    Chronic kidney disease, stage III (moderate) (HCC) monitor renal function electrolytes.    Vascular dementia [facility]) Supportive care. Continue Namenda 10 mg p.o. twice daily. BP, DM and lipids control.    Essential hypertension continue lisinopril 40 mg p.o. daily. Continue metoprolol 25 mg p.o. daily. Monitor BP, HR, renal function electrolytes.    Hemiparesis affecting right side as late effect of cerebrovascular accident (CVA) ([place]) Supportive care. Aspirin for CVA prevention. DM 2, hyperlipidemia and hypertension controlled.    BPH (benign prostatic hyperplasia) continue tamsulosin 0.4 mg p.o. daily.    HLD (hyperlipidemia) continue atorvastatin 10 mg p.o. daily.    Mobitz type 1 second degree atrioventricular block Telemetry monitoring while in the hospital per cardiology.    Hypomagnesemia replacement ordered. Follow-up magnesium level as needed.   DVT prophylaxis: Lovenox  SQ. Code Status: Full code. Family Communication: Disposition Plan: Admit for IV antibiotic therapy for 2 to 3 days. Consults called: Admission status: Inpatient/telemetry.   Reubin Milan MD [facility]  If 7PM-7AM, please contact night-coverage [email or web address]  03/23/2019, 8:33 PM   This document was prepared using Dragon voice recognition software and may contain some unintended transcription errors.

## 2019-03-24 HISTORY — DX: Hypomagnesemia: E83.42

## 2019-03-24 LAB — CBC WITH DIFFERENTIAL/PLATELET
Abs Immature Granulocytes: 0.05 10*3/uL (ref 0.00–0.07)
Basophils Absolute: 0.1 10*3/uL (ref 0.0–0.1)
Basophils Relative: 1 %
Eosinophils Absolute: 0.3 10*3/uL (ref 0.0–0.5)
Eosinophils Relative: 3 %
HCT: 38 % — ABNORMAL LOW (ref 39.0–52.0)
Hemoglobin: 12 g/dL — ABNORMAL LOW (ref 13.0–17.0)
Immature Granulocytes: 1 %
Lymphocytes Relative: 19 %
Lymphs Abs: 1.9 10*3/uL (ref 0.7–4.0)
MCH: 30.6 pg (ref 26.0–34.0)
MCHC: 31.6 g/dL (ref 30.0–36.0)
MCV: 96.9 fL (ref 80.0–100.0)
Monocytes Absolute: 0.8 10*3/uL (ref 0.1–1.0)
Monocytes Relative: 9 %
Neutro Abs: 6.7 10*3/uL (ref 1.7–7.7)
Neutrophils Relative %: 67 %
Platelets: 216 10*3/uL (ref 150–400)
RBC: 3.92 MIL/uL — ABNORMAL LOW (ref 4.22–5.81)
RDW: 12.6 % (ref 11.5–15.5)
WBC: 9.8 10*3/uL (ref 4.0–10.5)
nRBC: 0 % (ref 0.0–0.2)

## 2019-03-24 LAB — COMPREHENSIVE METABOLIC PANEL
ALT: 10 U/L (ref 0–44)
AST: 14 U/L — ABNORMAL LOW (ref 15–41)
Albumin: 3.1 g/dL — ABNORMAL LOW (ref 3.5–5.0)
Alkaline Phosphatase: 125 U/L (ref 38–126)
Anion gap: 9 (ref 5–15)
BUN: 19 mg/dL (ref 8–23)
CO2: 26 mmol/L (ref 22–32)
Calcium: 8.4 mg/dL — ABNORMAL LOW (ref 8.9–10.3)
Chloride: 103 mmol/L (ref 98–111)
Creatinine, Ser: 1.03 mg/dL (ref 0.61–1.24)
GFR calc Af Amer: >60 mL/min (ref >60)
GFR calc non Af Amer: >60 mL/min (ref >60)
Glucose, Bld: 158 mg/dL — ABNORMAL HIGH (ref 70–99)
Potassium: 3.6 mmol/L (ref 3.5–5.1)
Sodium: 138 mmol/L (ref 135–145)
Total Bilirubin: 1 mg/dL (ref 0.3–1.2)
Total Protein: 6.7 g/dL (ref 6.5–8.1)

## 2019-03-24 LAB — SARS CORONAVIRUS 2 (TAT 6-24 HRS): SARS Coronavirus 2: NEGATIVE

## 2019-03-24 LAB — GLUCOSE, CAPILLARY
Glucose-Capillary: 138 mg/dL — ABNORMAL HIGH (ref 70–99)
Glucose-Capillary: 126 mg/dL — ABNORMAL HIGH (ref 70–99)
Glucose-Capillary: 125 mg/dL — ABNORMAL HIGH (ref 70–99)
Glucose-Capillary: 150 mg/dL — ABNORMAL HIGH (ref 70–99)

## 2019-03-24 LAB — HEMOGLOBIN A1C
Hgb A1c MFr Bld: 6.8 % — ABNORMAL HIGH (ref 4.8–5.6)
Mean Plasma Glucose: 148.46 mg/dL

## 2019-03-24 MED ORDER — SODIUM CHLORIDE 0.9 % IV SOLN
1.0000 g | INTRAVENOUS | Status: DC
  Administered 2019-03-24: 1 g via INTRAVENOUS
  Filled 2019-03-24: qty 10

## 2019-03-24 MED ORDER — SODIUM CHLORIDE 0.9 % IV SOLN: INTRAVENOUS | Status: DC

## 2019-03-24 NOTE — Progress Notes (Signed)
Patient Demographics:    Mike Briggs, is a 86 y.o. male, DOB - 11-17-1928, [id number]  Admit date - 03/23/2019   Admitting Physician Reubin Milan, MD  Outpatient Primary MD for the patient is Rosita Fire, MD  LOS - 1   Chief Complaint  Patient presents with  . 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 male with medical history significant of osteoarthritis, cataracts, chronic kidney disease, history of COVID-19 infection, depression, type 2 diabetes, hyperlipidemia, hypertension, history of ischemic CVA with residual left-sided hemiparesis  A/p  1) generalized weakness/deconditioning in the setting of UTI----continue IV Rocephin pending cultures -Nausea persist, oral intake is not great -Patient with CKD 3 monitor renal function closely with UTI and poor oral intake  2)DM2-continue Metformin, monitor glucose closely given poor oral intake  3)HTN--stable, continue metoprolol and lisinopril,  Disposition/Need for in-Hospital Stay- patient unable to be discharged at this time due to--- poor oral intake, UTI, CKD 3, requires hydration IV and IV antibiotics -Possible discharge home on 03/25/2019 if improves on oral intake improves*  Code Status : Full  Family Communication:   NA (patient is  alert, awake and coherent)  Consults  :  na  DVT Prophylaxis  :  Lovenox- SCDs *  Lab Results  Component Value Date   PLT 216 03/24/2019    Inpatient Medications  Scheduled Meds: . ascorbic acid  500 mg Oral Daily  . aspirin  81 mg Oral Daily  . atorvastatin  10 mg Oral QPM  . colchicine  0.6 mg Oral q morning - 10a  . enoxaparin (LOVENOX) injection  40 mg Subcutaneous Q24H  . insulin aspart  0-9 Units Subcutaneous TID WC  . lisinopril  40 mg Oral Daily  . memantine  10 mg Oral BID  . metFORMIN  500 mg Oral BID WC  . pneumococcal 23 valent vaccine  0.5 mL Intramuscular Tomorrow-1000  . tamsulosin  0.4 mg Oral QPC breakfast  . zinc sulfate  220 mg Oral Daily   Continuous Infusions: . cefTRIAXone (ROCEPHIN)  IV 1 g (03/24/19 1726)   PRN Meds:.acetaminophen **OR** acetaminophen, prochlorperazine    Anti-infectives (From admission, onward)   Start     Dose/Rate Route Frequency Ordered Stop   03/24/19 1500  cefTRIAXone (ROCEPHIN) 1 g in sodium chloride 0.9 % 100 mL IVPB     1 g 200 mL/hr over 30 Minutes Intravenous Every 24 hours 03/24/19 0848     03/23/19 1530  cefTRIAXone (ROCEPHIN) 1 g in sodium chloride 0.9 % 100 mL IVPB     1 g 200  mL/hr over 30 Minutes Intravenous  Once 03/23/19 1514 03/23/19 1614        Objective:   Vitals:   03/23/19 2106 03/24/19 0513 03/24/19 0741 03/24/19 1258  BP: (!) 182/71 (!) 159/89  (!) 167/88  Pulse: 78 86  91  Resp: 20 17  18   Temp: 98.2 F (36.8 C) 98 F (36.7 C)  98.1 F (36.7 C)  TempSrc: Oral Axillary    SpO2: 98% 100% 96% 94%  Weight: 96.2 kg     Height: 5\' 11"  (1.803 m)       Wt Readings from Last 3 Encounters:  03/23/19 96.2 kg  12/18/18 94 kg  11/13/18 90.7 kg     Intake/Output Summary (Last 24 hours) at 03/24/2019 1839 Last data filed at 03/24/2019 0900 Gross per 24 hour  Intake 283.81 ml  Output 200 ml  Net 83.81 ml     Physical Exam  Gen:- Awake Alert,  In no apparent distress  HEENT:- [place].AT, No  sclera icterus Neck-Supple Neck,No JVD,.  Lungs-  CTAB , fair symmetrical air movement CV- S1, S2 normal, regular  Abd-  +ve B.Sounds, Abd Soft, No CVA area tenderness Extremity/Skin:- No  edema, pedal pulses present Psych-affect is appropriate, oriented x3 Neuro-no new focal deficits, no tremors   Data Review:   Micro Results Recent Results (from the past 240 hour(s))  Urine culture     Status: Abnormal (Preliminary result)   Collection Time: 03/23/19  2:02 PM   Specimen: Urine, Clean Catch  Result Value Ref Range Status   Specimen Description   Final    URINE, CLEAN CATCH Performed at [facility], [address], [place], [place] [address]    Special Requests   Final    NONE Performed at [facility], [address] [place], [place] [address]    Culture (A)  Final    40,000 COLONIES/mL KLEBSIELLA PNEUMONIAE SUSCEPTIBILITIES TO FOLLOW Performed at [facility], [place] [address] [place], [place] [address]    Report Status PENDING  Incomplete  SARS CORONAVIRUS 2 (TAT 6-24 HRS) Nasopharyngeal Nasopharyngeal Swab     Status: None   Collection Time: 03/23/19  8:01 PM   Specimen: Nasopharyngeal Swab  Result Value Ref Range Status   SARS Coronavirus 2 NEGATIVE NEGATIVE Final    Comment: (NOTE) SARS-CoV-2 target nucleic acids are NOT DETECTED. The SARS-CoV-2 RNA is generally detectable in upper and lower respiratory specimens during the acute phase of infection. Negative results do not preclude SARS-CoV-2 infection, do not rule out co-infections with other pathogens, and should not be used as the sole basis for treatment or other patient management decisions. Negative results must be combined with clinical observations, patient history, and epidemiological information. The expected result is Negative. Fact Sheet for Patients: SugarRoll.be Fact Sheet for Healthcare Providers: https://www.woods-mathews.com/ This test is not yet  approved or cleared by the [place] FDA and  has been authorized for detection and/or diagnosis of SARS-CoV-2 by FDA under an Emergency Use Authorization (EUA). This EUA will remain  in effect (meaning this test can be used) for the duration of the COVID-19 declaration under Section 56 4(b)(1) of the Act, 21 U.S.C. section 360bbb-3(b)(1), unless the authorization is terminated or revoked sooner. Performed at [facility], [place] [address] [place],  [address]     Radiology Reports DG Chest Portable 1 View  Result Date: 03/23/2019 CLINICAL DATA:  Generalized weakness EXAM: PORTABLE CHEST 1 VIEW COMPARISON:  12/17/2018 FINDINGS: The heart size  and mediastinal contours are stable. No focal airspace consolidation, pleural effusion, or pneumothorax. The visualized skeletal structures are unremarkable. IMPRESSION: No active disease. Electronically Signed   By: Davina Poke D.O.   On: 03/23/2019 14:43     CBC Recent Labs  Lab 03/23/19 1606 03/24/19 0519  WBC 11.0* 9.8  HGB 11.8* 12.0*  HCT 37.8* 38.0*  PLT 191 216  MCV 99.2 96.9  MCH 31.0 30.6  MCHC 31.2 31.6  RDW 12.8 12.6  LYMPHSABS 2.2 1.9  MONOABS 1.0 0.8  EOSABS 0.1 0.3  BASOSABS 0.0 0.1    Chemistries  Recent Labs  Lab 03/23/19 1606 03/24/19 0519  NA 139 138  K 4.0 3.6  CL 106 103  CO2 25 26  GLUCOSE 175* 158*  BUN 23 19  CREATININE 1.29* 1.03  CALCIUM 8.4* 8.4*  MG 1.3*  --   AST 14* 14*  ALT 12 10  ALKPHOS 122 125  BILITOT 0.7 1.0   ------------------------------------------------------------------------------------------------------------------ No results for input(s): CHOL, HDL, LDLCALC, TRIG, CHOLHDL, LDLDIRECT in the last 72 hours.  Lab Results  Component Value Date   HGBA1C 6.8 (H) 03/23/2019   ------------------------------------------------------------------------------------------------------------------ No results for input(s): TSH, T4TOTAL, T3FREE, THYROIDAB in the last 72  hours.  Invalid input(s): FREET3 ------------------------------------------------------------------------------------------------------------------ No results for input(s): VITAMINB12, FOLATE, FERRITIN, TIBC, IRON, RETICCTPCT in the last 72 hours.  Coagulation profile No results for input(s): INR, PROTIME in the last 168 hours.  No results for input(s): DDIMER in the last 72 hours.  Cardiac Enzymes No results for input(s): CKMB, TROPONINI, MYOGLOBIN in the last 168 hours.  Invalid input(s): CK ------------------------------------------------------------------------------------------------------------------ No results found for: BNP   Roxan Hockey M.D on 03/24/2019 at 6:39 PM  Go to [email or web address] - for contact info  [facility] - Office  [phone]

## 2019-03-25 DIAGNOSIS — N390 Urinary tract infection, site not specified: Secondary | ICD-10-CM | POA: Diagnosis present

## 2019-03-25 DIAGNOSIS — B9689 Other specified bacterial agents as the cause of diseases classified elsewhere: Secondary | ICD-10-CM | POA: Diagnosis present

## 2019-03-25 HISTORY — DX: Urinary tract infection, site not specified: N39.0

## 2019-03-25 HISTORY — DX: Other specified bacterial agents as the cause of diseases classified elsewhere: B96.89

## 2019-03-25 LAB — CBC WITH DIFFERENTIAL/PLATELET
Abs Immature Granulocytes: 0.05 10*3/uL (ref 0.00–0.07)
Basophils Absolute: 0.1 10*3/uL (ref 0.0–0.1)
Basophils Relative: 1 %
Eosinophils Absolute: 0.3 10*3/uL (ref 0.0–0.5)
Eosinophils Relative: 4 %
HCT: 37.5 % — ABNORMAL LOW (ref 39.0–52.0)
Hemoglobin: 11.7 g/dL — ABNORMAL LOW (ref 13.0–17.0)
Immature Granulocytes: 1 %
Lymphocytes Relative: 27 %
Lymphs Abs: 2.4 10*3/uL (ref 0.7–4.0)
MCH: 30.6 pg (ref 26.0–34.0)
MCHC: 31.2 g/dL (ref 30.0–36.0)
MCV: 98.2 fL (ref 80.0–100.0)
Monocytes Absolute: 0.8 10*3/uL (ref 0.1–1.0)
Monocytes Relative: 9 %
Neutro Abs: 5.2 10*3/uL (ref 1.7–7.7)
Neutrophils Relative %: 58 %
Platelets: 202 10*3/uL (ref 150–400)
RBC: 3.82 MIL/uL — ABNORMAL LOW (ref 4.22–5.81)
RDW: 12.7 % (ref 11.5–15.5)
WBC: 8.8 10*3/uL (ref 4.0–10.5)
nRBC: 0 % (ref 0.0–0.2)

## 2019-03-25 LAB — URINE CULTURE: Culture: 40000 — AB

## 2019-03-25 LAB — GLUCOSE, CAPILLARY
Glucose-Capillary: 123 mg/dL — ABNORMAL HIGH (ref 70–99)
Glucose-Capillary: 165 mg/dL — ABNORMAL HIGH (ref 70–99)
Glucose-Capillary: 113 mg/dL — ABNORMAL HIGH (ref 70–99)

## 2019-03-25 MED ORDER — ATORVASTATIN CALCIUM 10 MG PO TABS: 10.0000 mg | ORAL_TABLET | Freq: Every evening | ORAL | 5 refills | Status: DC

## 2019-03-25 MED ORDER — CEPHALEXIN 500 MG PO CAPS: 500.0000 mg | ORAL_CAPSULE | Freq: Three times a day (TID) | ORAL | 0 refills | Status: AC

## 2019-03-25 MED ORDER — AMLODIPINE BESYLATE 10 MG PO TABS: 10.0000 mg | ORAL_TABLET | Freq: Every day | ORAL | 2 refills | Status: DC

## 2019-03-25 MED ORDER — TAMSULOSIN HCL 0.4 MG PO CAPS: 0.4000 mg | ORAL_CAPSULE | Freq: Every day | ORAL | 3 refills | Status: DC

## 2019-03-25 MED ORDER — ZINC SULFATE 220 (50 ZN) MG PO CAPS: 220.0000 mg | ORAL_CAPSULE | Freq: Every day | ORAL | Status: DC

## 2019-03-25 MED ORDER — LISINOPRIL 40 MG PO TABS: 40.0000 mg | ORAL_TABLET | Freq: Every day | ORAL | 3 refills | Status: DC

## 2019-03-25 MED ORDER — ACETAMINOPHEN 325 MG PO TABS: 650.0000 mg | ORAL_TABLET | Freq: Four times a day (QID) | ORAL | 0 refills | Status: DC | PRN

## 2019-03-25 MED ORDER — METFORMIN HCL 500 MG PO TABS: 500.0000 mg | ORAL_TABLET | Freq: Two times a day (BID) | ORAL | 5 refills | Status: DC

## 2019-03-25 MED ORDER — SODIUM CHLORIDE 0.9 % IV SOLN
1.0000 g | Freq: Once | INTRAVENOUS | Status: DC
  Filled 2019-03-25: qty 10

## 2019-03-25 MED ORDER — ASPIRIN 81 MG PO CHEW: 81.0000 mg | CHEWABLE_TABLET | Freq: Every day | ORAL | 3 refills | Status: DC

## 2019-03-25 MED ORDER — METOPROLOL SUCCINATE ER 25 MG PO TB24: 25.0000 mg | ORAL_TABLET | Freq: Every day | ORAL | 3 refills | Status: DC

## 2019-03-25 NOTE — Progress Notes (Signed)
Pt confirmed understanding of discharge instructions and states that his preferred pharmacy makes home deliveries to his house. He shows no visible signs of distress and states "I am ready to go home. I can take care of myself." Pt dressed by NT and all belongs are bagged and with pt.

## 2019-03-25 NOTE — Discharge Summary (Signed)
Mike Briggs, is a 86 y.o. male  DOB [birth year]  MRN [id number].  Admission date:  03/23/2019  Admitting Physician  Reubin Milan, MD  Discharge Date:  03/25/2019   Primary MD  Rosita Fire, MD  Recommendations for primary care physician for things to follow:   1) take medications including Keflex antibiotics as prescribed 2) follow-up with your primary care physician within a week for recheck and reevaluation  Admission Diagnosis  Acute UTI [N39.0] Acute cystitis with hematuria [N30.01]   Discharge Diagnosis  Acute UTI [N39.0] Acute cystitis with hematuria [N30.01]    Principal Problem:   Klebsiella UTI  Active Problems:   Type 2 diabetes mellitus with diabetic chronic kidney disease ([place])   Chronic kidney disease, stage III (moderate) ([place])   Essential hypertension   Vascular dementia ([place])   Hemiparesis affecting right side as late effect of cerebrovascular accident (CVA) ([place])   BPH (benign prostatic hyperplasia)   HLD (hyperlipidemia)   Mobitz type 1 second degree atrioventricular block   Acute UTI   Hypomagnesemia      Past Medical History:  Diagnosis Date  . Arthritis   . Cataract   . Chronic kidney disease   . COVID-19   . Depression   . Diabetes mellitus   . Hyperlipidemia   . Hypertension   . Rhabdomyolysis 11/02/2018  . Stroke [facility])     Past Surgical History:  Procedure Laterality Date  . BACK SURGERY     cervical and lumbar  . CATARACT EXTRACTION    . CHOLECYSTECTOMY    . FOOT SURGERY     5th toe   . SPINE SURGERY     lumbar and cervical fusions     HPI  from the history and physical done on the day of admission:   -Chief Complaint: Weakness.  HPI: Mike Briggs is a 86 y.o. male with medical history significant of osteoarthritis, cataracts, chronic kidney disease, history of COVID-19 infection, depression, type 2 diabetes, hyperlipidemia,  hypertension, history of ischemic CVA with residual left-sided hemiparesis, history of rhabdomyolysis who is coming to the emergency department with complaints of weakness and lower abdominal pain.  No dysuria, hematuria or oliguria.  He denies fever, chills, night sweats, but has decreased appetite, weakness and malaise.  Denies dyspnea, wheezing, hemoptysis, chest pain, dizziness, orthopnea or lower extremity edema.  No nausea, vomiting, diarrhea, constipation, melena or hematochezia.  Denies polyuria, polydipsia, polyphagia or blurred vision.  ED Course: Initial vital signs temperature 99.4 F, pulse 96, respirations 20, blood pressure 139/79 mmHg and O2 sat 98% on room air.  The patient received ceftriaxone in the emergency department.  Urinalysis shows glucosuria of 50 and proteinuria 100 mg/dL.  Small hemoglobinuria, moderate leukocyte esterase.  Microscopic examination shows 11-20 RBC, more than 50 WBC and a few bacteria per hpf.  White count is 11.0 69% neutrophils, hemoglobin 11.8 g/dL and platelets 191.  CMP shows normal electrolytes when calcium is corrected to albumin.  Glucose under 75, BUN 23 and creatinine 1.29  mg/dL.  Albumin is 3.1 AST 43.  The rest of the hepatic functions are within normal limits.  Phosphorus was 2.7 and INR 1.3 mg/dL.    Hospital Course:   Brief Summary:- 86 y.o.malewith medical history significant ofosteoarthritis, cataracts, chronic kidney disease, history of COVID-19 infection, depression, type 2 diabetes, hyperlipidemia, hypertension, history of ischemic CVA with residual left-sided hemiparesis  A/p  1)Generalized weakness/deconditioning in the setting of Klebsiella UTI----WBC is down to 8.8 from 11.0, patient was treated with IV Rocephin, okay to discharge on p.o. Keflex as per culture and sensitivity reports -Oral intake is fair -Patient evaluated by physical therapist, recommends home health PT  2)CKD IIIa ---stable renal function, initially on  admission creatinine was up due to UTI and poor oral intake with dehydration, -Creatinine improved to 1.0 from 1.3,, GFR improved from 57 to more than 60 with hydration --Overall improved renal function with hydration,  3)DM2-A1c 6.8 reflecting excellent diabetic control PTA, c/n Metformin  3)HTN--stable, continue amlodipine, metoprolol and lisinopril,  Disposition--- discharge home on oral antibiotics with home health physical therapy  Code Status : Full  Family Communication:   NA (patient is alert, awake and coherent)  Consults  :  na  Discharge Condition: Stable,  Follow UP--with PCP in about a week or so  Diet and Activity recommendation:  As advised  Discharge Instructions    Discharge Instructions    Call MD for:  difficulty breathing, headache or visual disturbances   Complete by: As directed    Call MD for:  persistant dizziness or light-headedness   Complete by: As directed    Call MD for:  persistant nausea and vomiting   Complete by: As directed    Call MD for:  temperature >100.4   Complete by: As directed    Diet - low sodium heart healthy   Complete by: As directed    Discharge instructions   Complete by: As directed    1) take medications including Keflex antibiotics as prescribed 2) follow-up with your primary care physician within a week for recheck and reevaluation   Increase activity slowly   Complete by: As directed       Discharge Medications     Allergies as of 03/25/2019   No Known Allergies     Medication List    STOP taking these medications   colchicine 0.6 MG tablet     TAKE these medications   acetaminophen 325 MG tablet Commonly known as: TYLENOL Take 2 tablets (650 mg total) by mouth every 6 (six) hours as needed for mild pain (or Fever >/= 101).   amLODipine 10 MG tablet Commonly known as: NORVASC Take 1 tablet (10 mg total) by mouth daily.   ascorbic acid 500 MG tablet Commonly known as: VITAMIN C Take 1 tablet  (500 mg total) by mouth daily.   aspirin 81 MG chewable tablet Chew 1 tablet (81 mg total) by mouth daily with breakfast. What changed: when to take this   atorvastatin 10 MG tablet Commonly known as: LIPITOR Take 1 tablet (10 mg total) by mouth every evening. What changed: See the new instructions.   cephALEXin 500 MG capsule Commonly known as: Keflex Take 1 capsule (500 mg total) by mouth 3 (three) times daily for 3 days. Start on Thursday, 03/26/2019 Start taking on: March 26, 2019   lisinopril 40 MG tablet Commonly known as: ZESTRIL Take 1 tablet (40 mg total) by mouth daily.   memantine 10 MG tablet Commonly known as: NAMENDA  Take 10 mg by mouth 2 (two) times daily.   metFORMIN 500 MG tablet Commonly known as: Glucophage Take 1 tablet (500 mg total) by mouth 2 (two) times daily with a meal.   metoprolol succinate 25 MG 24 hr tablet Commonly known as: TOPROL-XL Take 1 tablet (25 mg total) by mouth daily.   tamsulosin 0.4 MG Caps capsule Commonly known as: FLOMAX Take 1 capsule (0.4 mg total) by mouth daily after breakfast.   zinc sulfate 220 (50 Zn) MG capsule Take 1 capsule (220 mg total) by mouth daily.      Major procedures and Radiology Reports - PLEASE review detailed and final reports for all details, in brief -   DG Chest Portable 1 View  Result Date: 03/23/2019 CLINICAL DATA:  Generalized weakness EXAM: PORTABLE CHEST 1 VIEW COMPARISON:  12/17/2018 FINDINGS: The heart size and mediastinal contours are stable. No focal airspace consolidation, pleural effusion, or pneumothorax. The visualized skeletal structures are unremarkable. IMPRESSION: No active disease. Electronically Signed   By: Davina Poke D.O.   On: 03/23/2019 14:43    Micro Results   Recent Results (from the past 240 hour(s))  Urine culture     Status: Abnormal   Collection Time: 03/23/19  2:02 PM   Specimen: Urine, Clean Catch  Result Value Ref Range Status   Specimen Description    Final    URINE, CLEAN CATCH Performed at [facility], [address], [place], [place] [address]    Special Requests   Final    NONE Performed at [facility], [address], [place], [place] [address]    Culture 40,000 COLONIES/mL KLEBSIELLA PNEUMONIAE (A)  Final   Report Status 03/25/2019 FINAL  Final   Organism ID, Bacteria KLEBSIELLA PNEUMONIAE (A)  Final      Susceptibility   Klebsiella pneumoniae - MIC*    AMPICILLIN >=32 RESISTANT Resistant     CEFAZOLIN <=4 SENSITIVE Sensitive     CEFTRIAXONE <=0.25 SENSITIVE Sensitive     CIPROFLOXACIN <=0.25 SENSITIVE Sensitive     GENTAMICIN <=1 SENSITIVE Sensitive     IMIPENEM <=0.25 SENSITIVE Sensitive     NITROFURANTOIN 128 RESISTANT Resistant     TRIMETH/SULFA <=20 SENSITIVE Sensitive     AMPICILLIN/SULBACTAM 16 INTERMEDIATE Intermediate     PIP/TAZO <=4 SENSITIVE Sensitive     * 40,000 COLONIES/mL KLEBSIELLA PNEUMONIAE  SARS CORONAVIRUS 2 (TAT 6-24 HRS) Nasopharyngeal Nasopharyngeal Swab     Status: None   Collection Time: 03/23/19  8:01 PM   Specimen: Nasopharyngeal Swab  Result Value Ref Range Status   SARS Coronavirus 2 NEGATIVE NEGATIVE Final    Comment: (NOTE) SARS-CoV-2 target nucleic acids are NOT DETECTED. The SARS-CoV-2 RNA is generally detectable in upper and lower respiratory specimens during the acute phase of infection. Negative results do not preclude SARS-CoV-2 infection, do not rule out co-infections with other pathogens, and should not be used as the sole basis for treatment or other patient management decisions. Negative results must be combined with clinical observations, patient history, and epidemiological information. The expected result is Negative. Fact Sheet for Patients: SugarRoll.be Fact Sheet for Healthcare Providers: https://www.woods-mathews.com/ This test is not yet approved or cleared by the [place] FDA and  has been authorized for detection  and/or diagnosis of SARS-CoV-2 by FDA under an Emergency Use Authorization (EUA). This EUA will remain  in effect (meaning this test can be used) for the duration of the COVID-19 declaration under Section 56 4(b)(1) of the Act, 21 U.S.C. section  360bbb-3(b)(1), unless the authorization is terminated or revoked sooner. Performed at [facility], [place] [address], [place], [place] [address]    Today   Subjective    Michaela Jourdan today has no new concerns, voiding ok    No Nausea, Vomiting or Diarrhea No fever  Or chills     Patient has been seen and examined prior to discharge   Objective   Blood pressure 123/67, pulse 95, temperature 98.5 F (36.9 C), temperature source Axillary, resp. rate 17, height 5\' 11"  (1.803 m), weight 96.2 kg, SpO2 97 %.   Intake/Output Summary (Last 24 hours) at 03/25/2019 1316 Last data filed at 03/25/2019 0500 Gross per 24 hour  Intake 510.39 ml  Output 600 ml  Net -89.61 ml    Exam Gen:- Awake Alert, no acute distress  HEENT:- [place].AT, No sclera icterus Neck-Supple Neck,No JVD,.  Lungs-  CTAB , good air movement bilaterally  CV- S1, S2 normal, regular Abd-  +ve B.Sounds, Abd Soft, No flank or CVA area tenderness,    Extremity/Skin:- No  edema,   good pulses Psych-affect is appropriate, oriented x3 Neuro-no new focal deficits, no tremors    Data Review   CBC w Diff:  Lab Results  Component Value Date   WBC 8.8 03/25/2019   HGB 11.7 (L) 03/25/2019   HCT 37.5 (L) 03/25/2019   PLT 202 03/25/2019   LYMPHOPCT 27 03/25/2019   MONOPCT 9 03/25/2019   EOSPCT 4 03/25/2019   BASOPCT 1 03/25/2019    CMP:  Lab Results  Component Value Date   NA 138 03/24/2019   K 3.6 03/24/2019   CL 103 03/24/2019   CO2 26 03/24/2019   BUN 19 03/24/2019   CREATININE 1.03 03/24/2019   CREATININE 1.89 (H) 09/10/2017   PROT 6.7 03/24/2019   ALBUMIN 3.1 (L) 03/24/2019   BILITOT 1.0 03/24/2019   ALKPHOS 125 03/24/2019   AST 14 (L) 03/24/2019   ALT  10 03/24/2019  .   Total Discharge time is about 33 minutes  Roxan Hockey M.D on 03/25/2019 at 1:16 PM  Go to [email or web address] -  for contact info  [facility] - Office  [phone]

## 2019-03-25 NOTE — TOC Transition Note (Signed)
Transition of Care [facility]) - CM/SW Discharge Note   Patient Details  Name: [name] MRN: [id number] Date of Birth: [birth year]  Transition of Care [facility]) CM/SW Contact:  Shade Flood, LCSW Phone Number: 03/25/2019, 4:27 PM   Clinical Narrative:     Pt being discharged with [facility] today. Spoke with pt and referred to [facility]. Pt needs EMS for transport. EMS form printed to the floor.  There are no other TOC needs for dc.  Final next level of care: [place] Barriers to Discharge: Barriers Resolved   Patient Goals and CMS Choice        Discharge Placement                       Discharge Plan and Services                          HH Arranged: PT [facility] Agency: [place] ([facility]) Date [facility] Agency Contacted: 03/25/19   Representative spoke with at [place]: [place] ([place]) Interventions     Readmission Risk Interventions Readmission Risk Prevention Plan 03/25/2019  Transportation Screening Complete  HRI or [place] Complete  Social Work Consult for [place] Planning/Counseling Complete  Palliative Care Screening Not Applicable  Medication Review [employer]) Complete  Some recent data might be hidden

## 2019-03-25 NOTE — Discharge Instructions (Signed)
1) take medications including Keflex antibiotics as prescribed 2) follow-up with your primary care physician within a week for recheck and reevaluation

## 2019-03-25 NOTE — Evaluation (Signed)
Physical Therapy Evaluation Patient Details Name: Mike Briggs MRN: [id number] DOB: December 04, 1928 Today's Date: 03/25/2019   History of Present Illness  Mike Briggs is a 86 y.o. male with medical history significant of osteoarthritis, cataracts, chronic kidney disease, history of COVID-19 infection, depression, type 2 diabetes, hyperlipidemia, hypertension, history of ischemic CVA with residual left-sided hemiparesis, history of rhabdomyolysis who is coming to the emergency department with complaints of weakness and lower abdominal pain.  No dysuria, hematuria or oliguria.  He denies fever, chills, night sweats, but has decreased appetite, weakness and malaise.  Denies dyspnea, wheezing, hemoptysis, chest pain, dizziness, orthopnea or lower extremity edema.  No nausea, vomiting, diarrhea, constipation, melena or hematochezia.  Denies polyuria, polydipsia, polyphagia or blurred vision.    Clinical Impression  Patient functioning near baseline for functional mobility and gait, has difficulty transferring to chair secondary to BLE weakness, patient advised to have home aides assist with transfers until safe to do on his own with understanding acknowledged.  Patient tolerated sitting up in chair after therapy.  Plan:  Patient discharged from physical therapy to care of nursing for out of bed daily as tolerated for length of stay.      Follow Up Recommendations Home health PT;Supervision for mobility/OOB;Supervision - Intermittent    Equipment Recommendations  None recommended by PT    Recommendations for Other Services       Precautions / Restrictions Precautions Precautions: Fall Restrictions Weight Bearing Restrictions: No      Mobility  Bed Mobility Overal bed mobility: Needs Assistance Bed Mobility: Supine to Sit     Supine to sit: Min assist;HOB elevated     General bed mobility comments: uses overhead trapeze and bed rails at home  Transfers Overall transfer level:  Needs assistance Equipment used: 1 person hand held assist Transfers: Sit to/from [employer] Transfers Sit to Stand: Min assist;Mod assist Stand pivot transfers: Min assist;Mod assist       General transfer comment: limited secondary to BLE weakness  Ambulation/Gait                Stairs            Wheelchair Mobility    Modified Rankin (Stroke Patients Only)       Balance Overall balance assessment: Needs assistance Sitting-balance support: Feet supported;No upper extremity supported Sitting balance-Leahy Scale: Good Sitting balance - Comments: seated at EOB   Standing balance support: During functional activity;No upper extremity supported Standing balance-Leahy Scale: Poor Standing balance comment: with hand held assist                             Pertinent Vitals/Pain Pain Assessment: No/denies pain    Home Living Family/patient expects to be discharged to:: Private residence Living Arrangements: Alone Available Help at Discharge: Family;Personal care attendant;Available PRN/intermittently Type of Home: Apartment Home Access: Level entry     Home Layout: One level Home Equipment: Walker - 2 wheels;Bedside commode;Shower seat;Grab bars - toilet;Grab bars - tub/shower;Electric scooter;Hospital bed      Prior Function Level of Independence: Needs assistance   Gait / Transfers Assistance Needed: primarily uses scooter; ableto transfer self into scooter Mod independently  ADL's / Homemaking Assistance Needed: home aides from 8-12 am and 3-6 PM x 7 days/week, family available to come to house at night when requested        Hand Dominance   Dominant Hand: Right    Extremity/Trunk Assessment  Upper Extremity Assessment Upper Extremity Assessment: Generalized weakness    Lower Extremity Assessment Lower Extremity Assessment: Generalized weakness    Cervical / Trunk Assessment Cervical / Trunk Assessment: Kyphotic   Communication   Communication: No difficulties  Cognition Arousal/Alertness: Awake/alert Behavior During Therapy: WFL for tasks assessed/performed Overall Cognitive Status: Within Functional Limits for tasks assessed                                        General Comments      Exercises     Assessment/Plan    PT Assessment All further PT needs can be met in the next venue of care  PT Problem List Decreased strength;Decreased activity tolerance;Decreased balance;Decreased mobility       PT Treatment Interventions      PT Goals (Current goals can be found in the Care Plan section)  Acute Rehab PT Goals Patient Stated Goal: Return home with home aides to assist PT Goal Formulation: With patient Time For Goal Achievement: 03/25/19 Potential to Achieve Goals: Good    Frequency     Barriers to discharge        Co-evaluation               AM-PAC PT "6 Clicks" Mobility  Outcome Measure Help needed turning from your back to your side while in a flat bed without using bedrails?: A Little Help needed moving from lying on your back to sitting on the side of a flat bed without using bedrails?: A Little Help needed moving to and from a bed to a chair (including a wheelchair)?: A Little Help needed standing up from a chair using your arms (e.g., wheelchair or bedside chair)?: A Little Help needed to walk in hospital room?: Total Help needed climbing 3-5 steps with a railing? : Total 6 Click Score: 14    End of Session   Activity Tolerance: Patient tolerated treatment well;Patient limited by fatigue Patient left: in chair Nurse Communication: Mobility status PT Visit Diagnosis: Unsteadiness on feet (R26.81);Muscle weakness (generalized) (M62.81)    Time: 8101-7510 PT Time Calculation (min) (ACUTE ONLY): 27 min   Charges:   PT Evaluation $PT Eval Moderate Complexity: 1 Mod PT Treatments $Therapeutic Activity: 23-37 mins        12:20 PM,  03/25/19 Lonell Grandchild, MPT Physical Therapist with [facility] [phone] office [phone] mobile phone

## 2019-03-25 NOTE — Care Management Important Message (Signed)
Important Message  Patient Details  Name: Mike Briggs MRN: [id number] Date of Birth: 1928/10/09   Medicare Important Message Given:  Yes(Ashley, RN was asked to deliver letter due to contact precautions)     [name] 03/25/2019, 12:00 PM

## 2019-03-26 DIAGNOSIS — M1990 Unspecified osteoarthritis, unspecified site: Secondary | ICD-10-CM | POA: Diagnosis not present

## 2019-03-26 DIAGNOSIS — I441 Atrioventricular block, second degree: Secondary | ICD-10-CM | POA: Diagnosis not present

## 2019-03-26 DIAGNOSIS — N1831 Chronic kidney disease, stage 3a: Secondary | ICD-10-CM | POA: Diagnosis not present

## 2019-03-26 DIAGNOSIS — B961 Klebsiella pneumoniae [K. pneumoniae] as the cause of diseases classified elsewhere: Secondary | ICD-10-CM | POA: Diagnosis not present

## 2019-03-26 DIAGNOSIS — N3001 Acute cystitis with hematuria: Secondary | ICD-10-CM | POA: Diagnosis not present

## 2019-03-26 DIAGNOSIS — Z7982 Long term (current) use of aspirin: Secondary | ICD-10-CM | POA: Diagnosis not present

## 2019-03-26 DIAGNOSIS — E785 Hyperlipidemia, unspecified: Secondary | ICD-10-CM | POA: Diagnosis not present

## 2019-03-26 DIAGNOSIS — Z7984 Long term (current) use of oral hypoglycemic drugs: Secondary | ICD-10-CM | POA: Diagnosis not present

## 2019-03-26 DIAGNOSIS — N400 Enlarged prostate without lower urinary tract symptoms: Secondary | ICD-10-CM | POA: Diagnosis not present

## 2019-03-26 DIAGNOSIS — E1122 Type 2 diabetes mellitus with diabetic chronic kidney disease: Secondary | ICD-10-CM | POA: Diagnosis not present

## 2019-03-26 DIAGNOSIS — F0150 Vascular dementia without behavioral disturbance: Secondary | ICD-10-CM | POA: Diagnosis not present

## 2019-03-26 DIAGNOSIS — F329 Major depressive disorder, single episode, unspecified: Secondary | ICD-10-CM | POA: Diagnosis not present

## 2019-03-26 DIAGNOSIS — M6282 Rhabdomyolysis: Secondary | ICD-10-CM | POA: Diagnosis not present

## 2019-03-26 DIAGNOSIS — I69354 Hemiplegia and hemiparesis following cerebral infarction affecting left non-dominant side: Secondary | ICD-10-CM | POA: Diagnosis not present

## 2019-03-26 DIAGNOSIS — H259 Unspecified age-related cataract: Secondary | ICD-10-CM | POA: Diagnosis not present

## 2019-03-26 DIAGNOSIS — I129 Hypertensive chronic kidney disease with stage 1 through stage 4 chronic kidney disease, or unspecified chronic kidney disease: Secondary | ICD-10-CM | POA: Diagnosis not present

## 2019-03-26 DIAGNOSIS — Z8616 Personal history of COVID-19: Secondary | ICD-10-CM | POA: Diagnosis not present

## 2019-03-30 DIAGNOSIS — E1165 Type 2 diabetes mellitus with hyperglycemia: Secondary | ICD-10-CM | POA: Diagnosis not present

## 2019-03-30 DIAGNOSIS — G8190 Hemiplegia, unspecified affecting unspecified side: Secondary | ICD-10-CM | POA: Diagnosis not present

## 2019-03-30 DIAGNOSIS — N183 Chronic kidney disease, stage 3 unspecified: Secondary | ICD-10-CM | POA: Diagnosis not present

## 2019-03-30 DIAGNOSIS — I10 Essential (primary) hypertension: Secondary | ICD-10-CM | POA: Diagnosis not present

## 2019-04-01 DIAGNOSIS — B961 Klebsiella pneumoniae [K. pneumoniae] as the cause of diseases classified elsewhere: Secondary | ICD-10-CM | POA: Diagnosis not present

## 2019-04-01 DIAGNOSIS — M6282 Rhabdomyolysis: Secondary | ICD-10-CM | POA: Diagnosis not present

## 2019-04-01 DIAGNOSIS — N1831 Chronic kidney disease, stage 3a: Secondary | ICD-10-CM | POA: Diagnosis not present

## 2019-04-01 DIAGNOSIS — N3001 Acute cystitis with hematuria: Secondary | ICD-10-CM | POA: Diagnosis not present

## 2019-04-01 DIAGNOSIS — I129 Hypertensive chronic kidney disease with stage 1 through stage 4 chronic kidney disease, or unspecified chronic kidney disease: Secondary | ICD-10-CM | POA: Diagnosis not present

## 2019-04-01 DIAGNOSIS — E1122 Type 2 diabetes mellitus with diabetic chronic kidney disease: Secondary | ICD-10-CM | POA: Diagnosis not present

## 2019-04-02 DIAGNOSIS — M6282 Rhabdomyolysis: Secondary | ICD-10-CM | POA: Diagnosis not present

## 2019-04-02 DIAGNOSIS — B961 Klebsiella pneumoniae [K. pneumoniae] as the cause of diseases classified elsewhere: Secondary | ICD-10-CM | POA: Diagnosis not present

## 2019-04-02 DIAGNOSIS — N1831 Chronic kidney disease, stage 3a: Secondary | ICD-10-CM | POA: Diagnosis not present

## 2019-04-02 DIAGNOSIS — N3001 Acute cystitis with hematuria: Secondary | ICD-10-CM | POA: Diagnosis not present

## 2019-04-02 DIAGNOSIS — I129 Hypertensive chronic kidney disease with stage 1 through stage 4 chronic kidney disease, or unspecified chronic kidney disease: Secondary | ICD-10-CM | POA: Diagnosis not present

## 2019-04-02 DIAGNOSIS — E1122 Type 2 diabetes mellitus with diabetic chronic kidney disease: Secondary | ICD-10-CM | POA: Diagnosis not present

## 2019-04-03 DIAGNOSIS — E1122 Type 2 diabetes mellitus with diabetic chronic kidney disease: Secondary | ICD-10-CM | POA: Diagnosis not present

## 2019-04-03 DIAGNOSIS — B961 Klebsiella pneumoniae [K. pneumoniae] as the cause of diseases classified elsewhere: Secondary | ICD-10-CM | POA: Diagnosis not present

## 2019-04-03 DIAGNOSIS — N1831 Chronic kidney disease, stage 3a: Secondary | ICD-10-CM | POA: Diagnosis not present

## 2019-04-03 DIAGNOSIS — I129 Hypertensive chronic kidney disease with stage 1 through stage 4 chronic kidney disease, or unspecified chronic kidney disease: Secondary | ICD-10-CM | POA: Diagnosis not present

## 2019-04-03 DIAGNOSIS — N3001 Acute cystitis with hematuria: Secondary | ICD-10-CM | POA: Diagnosis not present

## 2019-04-03 DIAGNOSIS — M6282 Rhabdomyolysis: Secondary | ICD-10-CM | POA: Diagnosis not present

## 2019-04-06 DIAGNOSIS — E1122 Type 2 diabetes mellitus with diabetic chronic kidney disease: Secondary | ICD-10-CM | POA: Diagnosis not present

## 2019-04-06 DIAGNOSIS — I129 Hypertensive chronic kidney disease with stage 1 through stage 4 chronic kidney disease, or unspecified chronic kidney disease: Secondary | ICD-10-CM | POA: Diagnosis not present

## 2019-04-06 DIAGNOSIS — M6282 Rhabdomyolysis: Secondary | ICD-10-CM | POA: Diagnosis not present

## 2019-04-06 DIAGNOSIS — N1831 Chronic kidney disease, stage 3a: Secondary | ICD-10-CM | POA: Diagnosis not present

## 2019-04-06 DIAGNOSIS — B961 Klebsiella pneumoniae [K. pneumoniae] as the cause of diseases classified elsewhere: Secondary | ICD-10-CM | POA: Diagnosis not present

## 2019-04-06 DIAGNOSIS — N3001 Acute cystitis with hematuria: Secondary | ICD-10-CM | POA: Diagnosis not present

## 2019-04-07 ENCOUNTER — Other Ambulatory Visit: Payer: Self-pay

## 2019-04-07 ENCOUNTER — Encounter (HOSPITAL_COMMUNITY): Payer: Self-pay | Admitting: Emergency Medicine

## 2019-04-07 ENCOUNTER — Emergency Department (HOSPITAL_COMMUNITY)
Admission: EM | Admit: 2019-04-07 | Discharge: 2019-04-08 | Disposition: A | Discharge status: Home / Self Care | Payer: Medicare Other | Attending: Emergency Medicine | Admitting: Emergency Medicine

## 2019-04-07 DIAGNOSIS — E1122 Type 2 diabetes mellitus with diabetic chronic kidney disease: Secondary | ICD-10-CM | POA: Insufficient documentation

## 2019-04-07 DIAGNOSIS — Z79899 Other long term (current) drug therapy: Secondary | ICD-10-CM | POA: Insufficient documentation

## 2019-04-07 DIAGNOSIS — I129 Hypertensive chronic kidney disease with stage 1 through stage 4 chronic kidney disease, or unspecified chronic kidney disease: Secondary | ICD-10-CM | POA: Diagnosis not present

## 2019-04-07 DIAGNOSIS — B961 Klebsiella pneumoniae [K. pneumoniae] as the cause of diseases classified elsewhere: Secondary | ICD-10-CM | POA: Diagnosis not present

## 2019-04-07 DIAGNOSIS — R739 Hyperglycemia, unspecified: Secondary | ICD-10-CM

## 2019-04-07 DIAGNOSIS — N1831 Chronic kidney disease, stage 3a: Secondary | ICD-10-CM | POA: Diagnosis not present

## 2019-04-07 DIAGNOSIS — E1165 Type 2 diabetes mellitus with hyperglycemia: Secondary | ICD-10-CM | POA: Diagnosis not present

## 2019-04-07 DIAGNOSIS — M6282 Rhabdomyolysis: Secondary | ICD-10-CM | POA: Diagnosis not present

## 2019-04-07 DIAGNOSIS — Z87891 Personal history of nicotine dependence: Secondary | ICD-10-CM | POA: Insufficient documentation

## 2019-04-07 DIAGNOSIS — Z7982 Long term (current) use of aspirin: Secondary | ICD-10-CM | POA: Diagnosis not present

## 2019-04-07 DIAGNOSIS — N183 Chronic kidney disease, stage 3 unspecified: Secondary | ICD-10-CM | POA: Insufficient documentation

## 2019-04-07 DIAGNOSIS — I10 Essential (primary) hypertension: Secondary | ICD-10-CM | POA: Diagnosis not present

## 2019-04-07 DIAGNOSIS — N3001 Acute cystitis with hematuria: Secondary | ICD-10-CM | POA: Diagnosis not present

## 2019-04-07 DIAGNOSIS — Z794 Long term (current) use of insulin: Secondary | ICD-10-CM | POA: Diagnosis not present

## 2019-04-07 DIAGNOSIS — Z8673 Personal history of transient ischemic attack (TIA), and cerebral infarction without residual deficits: Secondary | ICD-10-CM | POA: Insufficient documentation

## 2019-04-07 LAB — CBG MONITORING, ED
Glucose-Capillary: 283 mg/dL — ABNORMAL HIGH (ref 70–99)
Glucose-Capillary: 164 mg/dL — ABNORMAL HIGH (ref 70–99)

## 2019-04-07 NOTE — ED Triage Notes (Signed)
Per [facility] they was called out for hyperglycemia, when arrived pts nurse aid left and came back to pt house and found pt "breathing hard" pt was sleeping, nurse aid told ems pt has hyperglycemia and was changed to metformin but family hasnt been giving him metformin for about a week. Pt cgb on scene was 291.

## 2019-04-07 NOTE — ED Provider Notes (Signed)
[facility] EMERGENCY DEPARTMENT Provider Note   CSN: [id number] Arrival date & time: 04/07/19  2159     History Chief Complaint  Patient presents with  . Hyperglycemia    Mike Briggs is a 86 y.o. male.  86 year old male brought in by EMS from home.  Per EMS, patient's aide checked on him and he was "breathing hard" while he was sleeping.  EMS checked his blood sugar was found to have a glucose of 291.  Patient is awake, alert, states that his blood sugar has been running high but does not have any complaints otherwise.        Past Medical History:  Diagnosis Date  . Arthritis   . Cataract   . Chronic kidney disease   . COVID-19   . Depression   . Diabetes mellitus   . Hyperlipidemia   . Hypertension   . Rhabdomyolysis 11/02/2018  . Stroke [facility])     Patient Active Problem List   Diagnosis Date Noted  . Klebsiella UTI  03/25/2019  . Hypomagnesemia 03/24/2019  . Acute UTI 03/23/2019  . Acute cystitis 12/17/2018  . Mobitz type 1 second degree atrioventricular block   . TIA (transient ischemic attack) 11/14/2018  . Acute encephalopathy 11/14/2018  . Sacral decubitus ulcer, stage III ([place]) 11/11/2018  . Pressure injury of skin 11/08/2018  . COVID-19 virus infection 11/03/2018  . COVID-19 virus detected 11/03/2018  . Gram-positive bacteremia 11/03/2018  . Dehydration 11/02/2018  . Rhabdomyolysis 11/02/2018  . UTI (urinary tract infection) 02/26/2018  . Syncope 02/25/2018  . Vitamin D deficiency 05/31/2016  . Essential hypertension 05/22/2016  . Hemiparesis affecting right side as late effect of cerebrovascular accident (CVA) ([place]) 05/22/2016  . Personal history of gout 05/22/2016  . BPH (benign prostatic hyperplasia) 05/22/2016  . Chronic constipation 05/22/2016  . HLD (hyperlipidemia) 05/22/2016  . Pseudophakia 12/06/2015  . Vascular dementia ([place]) 10/23/2012  . Chronic kidney disease, stage III (moderate) ([place]) 10/20/2012  . Type 2 diabetes mellitus  with diabetic chronic kidney disease ([place]) 12/16/2008  . ANKLE, ARTHRITIS, DEGEN./OSTEO 12/16/2008    Past Surgical History:  Procedure Laterality Date  . BACK SURGERY     cervical and lumbar  . CATARACT EXTRACTION    . CHOLECYSTECTOMY    . FOOT SURGERY     5th toe   . SPINE SURGERY     lumbar and cervical fusions       Family History  Family history unknown: Yes    Social History   Tobacco Use  . Smoking status: Former Smoker    Quit date: 08/07/1967    Years since quitting: 51.7  . Smokeless tobacco: Never Used  Substance Use Topics  . Alcohol use: No  . Drug use: No    Home Medications Prior to Admission medications   Medication Sig Start Date End Date Taking? Authorizing Provider  acetaminophen (TYLENOL) 325 MG tablet Take 2 tablets (650 mg total) by mouth every 6 (six) hours as needed for mild pain (or Fever >/= 101). 03/25/19   Emokpae, Courage, MD  amLODipine (NORVASC) 10 MG tablet Take 1 tablet (10 mg total) by mouth daily. 03/25/19   Roxan Hockey, MD  aspirin 81 MG chewable tablet Chew 1 tablet (81 mg total) by mouth daily with breakfast. 03/25/19   Roxan Hockey, MD  atorvastatin (LIPITOR) 10 MG tablet Take 1 tablet (10 mg total) by mouth every evening. 03/25/19   Denton Brick, Courage, MD  LANTUS SOLOSTAR 100 UNIT/ML Solostar Pen Inject 5  Units into the skin daily. 04/01/19   [provider]  lisinopril (ZESTRIL) 40 MG tablet Take 1 tablet (40 mg total) by mouth daily. 03/25/19   Roxan Hockey, MD  memantine (NAMENDA) 10 MG tablet Take 10 mg by mouth 2 (two) times daily.  04/18/17   [provider]  metFORMIN (GLUCOPHAGE) 500 MG tablet Take 1 tablet (500 mg total) by mouth 2 (two) times daily with a meal. 03/25/19 03/24/20  Roxan Hockey, MD  metoprolol succinate (TOPROL-XL) 25 MG 24 hr tablet Take 1 tablet (25 mg total) by mouth daily. 03/25/19   Roxan Hockey, MD  tamsulosin (FLOMAX) 0.4 MG CAPS capsule Take 1 capsule (0.4 mg total) by mouth  daily after breakfast. 03/25/19   Roxan Hockey, MD  vitamin C (VITAMIN C) 500 MG tablet Take 1 tablet (500 mg total) by mouth daily. 11/19/18   Eugenie Filler, MD  zinc sulfate 220 (50 Zn) MG capsule Take 1 capsule (220 mg total) by mouth daily. 03/25/19   Roxan Hockey, MD    Allergies    Patient has no known allergies.  Review of Systems   Review of Systems  Constitutional: Negative for fever.  Respiratory: Negative for shortness of breath.   Cardiovascular: Negative for chest pain.  Gastrointestinal: Negative for abdominal pain.  Skin: Negative for wound.  Allergic/Immunologic: Positive for immunocompromised state.    Physical Exam Updated Vital Signs BP 131/65 (BP Location: Right Arm)   Pulse 73   Temp 98.6 F (37 C) (Oral)   Resp 18   SpO2 99%   Physical Exam Vitals and nursing note reviewed.  Constitutional:      Appearance: Normal appearance. He is obese.  Cardiovascular:     Rate and Rhythm: Normal rate. Rhythm irregular.     Pulses: Normal pulses.     Heart sounds: Normal heart sounds.  Pulmonary:     Effort: Pulmonary effort is normal.     Breath sounds: Normal breath sounds.  Abdominal:     Palpations: Abdomen is soft.     Tenderness: There is no abdominal tenderness.  Musculoskeletal:     Right lower leg: No edema.     Left lower leg: No edema.  Neurological:     Mental Status: He is alert.     ED Results / Procedures / Treatments   Labs (all labs ordered are listed, but only abnormal results are displayed) Labs Reviewed  CBG MONITORING, ED - Abnormal; Notable for the following components:      Result Value   Glucose-Capillary 283 (*)    All other components within normal limits  CBG MONITORING, ED - Abnormal; Notable for the following components:   Glucose-Capillary 164 (*)    All other components within normal limits    EKG EKG Interpretation  Date/Time:  Tuesday April 07 2019 22:03:58 EST Ventricular Rate:  69 PR Interval:      QRS Duration: 80 QT Interval:  376 QTC Calculation: 429 R Axis:   -14 Text Interpretation: Sinus rhythm Sinus pause Prolonged PR interval Low voltage, precordial leads Abnormal R-wave progression, early transition Left ventricular hypertrophy Similar to prior. No STEMI. Confirmed by Nanda Quinton [phone]) on 04/07/2019 10:28:08 PM   Radiology No results found.  Procedures Procedures (including critical care time)  Medications Ordered in ED Medications - No data to display  ED Course  I have reviewed the triage vital signs and the nursing notes.  Pertinent labs & imaging results that were available during my care  of the patient were reviewed by me and considered in my medical decision making (see chart for details).  Clinical Course as of Apr 06 2329  Tue Apr 06, 9652  4179 86 year old male brought in by EMS for concern for bleeding heart while sleeping, found to have elevated blood sugar, patient returns emergency room without complaints.  On exam, found to have regular heart rate.  EKG unchanged from prior, CBC initially elevated at 283, has improved to 164 with fluids.  Patient will be discharged follow-up PCP as needed.   [LM]    Clinical Course User Index [LM] Roque Lias   MDM Rules/Calculators/A&P                      Final Clinical Impression(s) / ED Diagnoses Final diagnoses:  Hyperglycemia    Rx / DC Orders ED Discharge Orders    None       Roque Lias 04/07/19 2331    Margette Fast, MD 04/09/19 1927

## 2019-04-07 NOTE — Discharge Instructions (Addendum)
Blood sugar was elevated, improved with fluids. Follow up with your doctor as needed.

## 2019-04-08 DIAGNOSIS — M6282 Rhabdomyolysis: Secondary | ICD-10-CM | POA: Diagnosis not present

## 2019-04-08 DIAGNOSIS — B961 Klebsiella pneumoniae [K. pneumoniae] as the cause of diseases classified elsewhere: Secondary | ICD-10-CM | POA: Diagnosis not present

## 2019-04-08 DIAGNOSIS — E1122 Type 2 diabetes mellitus with diabetic chronic kidney disease: Secondary | ICD-10-CM | POA: Diagnosis not present

## 2019-04-08 DIAGNOSIS — N3001 Acute cystitis with hematuria: Secondary | ICD-10-CM | POA: Diagnosis not present

## 2019-04-08 DIAGNOSIS — N1831 Chronic kidney disease, stage 3a: Secondary | ICD-10-CM | POA: Diagnosis not present

## 2019-04-08 DIAGNOSIS — I129 Hypertensive chronic kidney disease with stage 1 through stage 4 chronic kidney disease, or unspecified chronic kidney disease: Secondary | ICD-10-CM | POA: Diagnosis not present

## 2019-04-13 DIAGNOSIS — B961 Klebsiella pneumoniae [K. pneumoniae] as the cause of diseases classified elsewhere: Secondary | ICD-10-CM | POA: Diagnosis not present

## 2019-04-13 DIAGNOSIS — M6282 Rhabdomyolysis: Secondary | ICD-10-CM | POA: Diagnosis not present

## 2019-04-13 DIAGNOSIS — E1122 Type 2 diabetes mellitus with diabetic chronic kidney disease: Secondary | ICD-10-CM | POA: Diagnosis not present

## 2019-04-13 DIAGNOSIS — I129 Hypertensive chronic kidney disease with stage 1 through stage 4 chronic kidney disease, or unspecified chronic kidney disease: Secondary | ICD-10-CM | POA: Diagnosis not present

## 2019-04-13 DIAGNOSIS — N1831 Chronic kidney disease, stage 3a: Secondary | ICD-10-CM | POA: Diagnosis not present

## 2019-04-13 DIAGNOSIS — N3001 Acute cystitis with hematuria: Secondary | ICD-10-CM | POA: Diagnosis not present

## 2019-04-15 DIAGNOSIS — M6282 Rhabdomyolysis: Secondary | ICD-10-CM | POA: Diagnosis not present

## 2019-04-15 DIAGNOSIS — I129 Hypertensive chronic kidney disease with stage 1 through stage 4 chronic kidney disease, or unspecified chronic kidney disease: Secondary | ICD-10-CM | POA: Diagnosis not present

## 2019-04-15 DIAGNOSIS — B961 Klebsiella pneumoniae [K. pneumoniae] as the cause of diseases classified elsewhere: Secondary | ICD-10-CM | POA: Diagnosis not present

## 2019-04-15 DIAGNOSIS — N1831 Chronic kidney disease, stage 3a: Secondary | ICD-10-CM | POA: Diagnosis not present

## 2019-04-15 DIAGNOSIS — E1122 Type 2 diabetes mellitus with diabetic chronic kidney disease: Secondary | ICD-10-CM | POA: Diagnosis not present

## 2019-04-15 DIAGNOSIS — N3001 Acute cystitis with hematuria: Secondary | ICD-10-CM | POA: Diagnosis not present

## 2019-04-16 DIAGNOSIS — M6282 Rhabdomyolysis: Secondary | ICD-10-CM | POA: Diagnosis not present

## 2019-04-16 DIAGNOSIS — E1122 Type 2 diabetes mellitus with diabetic chronic kidney disease: Secondary | ICD-10-CM | POA: Diagnosis not present

## 2019-04-16 DIAGNOSIS — N3001 Acute cystitis with hematuria: Secondary | ICD-10-CM | POA: Diagnosis not present

## 2019-04-16 DIAGNOSIS — B961 Klebsiella pneumoniae [K. pneumoniae] as the cause of diseases classified elsewhere: Secondary | ICD-10-CM | POA: Diagnosis not present

## 2019-04-16 DIAGNOSIS — I129 Hypertensive chronic kidney disease with stage 1 through stage 4 chronic kidney disease, or unspecified chronic kidney disease: Secondary | ICD-10-CM | POA: Diagnosis not present

## 2019-04-16 DIAGNOSIS — N1831 Chronic kidney disease, stage 3a: Secondary | ICD-10-CM | POA: Diagnosis not present

## 2019-04-21 DIAGNOSIS — E1122 Type 2 diabetes mellitus with diabetic chronic kidney disease: Secondary | ICD-10-CM | POA: Diagnosis not present

## 2019-04-21 DIAGNOSIS — I129 Hypertensive chronic kidney disease with stage 1 through stage 4 chronic kidney disease, or unspecified chronic kidney disease: Secondary | ICD-10-CM | POA: Diagnosis not present

## 2019-04-21 DIAGNOSIS — N1831 Chronic kidney disease, stage 3a: Secondary | ICD-10-CM | POA: Diagnosis not present

## 2019-04-21 DIAGNOSIS — M6282 Rhabdomyolysis: Secondary | ICD-10-CM | POA: Diagnosis not present

## 2019-04-21 DIAGNOSIS — N3001 Acute cystitis with hematuria: Secondary | ICD-10-CM | POA: Diagnosis not present

## 2019-04-21 DIAGNOSIS — B961 Klebsiella pneumoniae [K. pneumoniae] as the cause of diseases classified elsewhere: Secondary | ICD-10-CM | POA: Diagnosis not present

## 2019-04-23 DIAGNOSIS — I129 Hypertensive chronic kidney disease with stage 1 through stage 4 chronic kidney disease, or unspecified chronic kidney disease: Secondary | ICD-10-CM | POA: Diagnosis not present

## 2019-04-23 DIAGNOSIS — E1122 Type 2 diabetes mellitus with diabetic chronic kidney disease: Secondary | ICD-10-CM | POA: Diagnosis not present

## 2019-04-23 DIAGNOSIS — N3001 Acute cystitis with hematuria: Secondary | ICD-10-CM | POA: Diagnosis not present

## 2019-04-23 DIAGNOSIS — N1831 Chronic kidney disease, stage 3a: Secondary | ICD-10-CM | POA: Diagnosis not present

## 2019-04-23 DIAGNOSIS — B961 Klebsiella pneumoniae [K. pneumoniae] as the cause of diseases classified elsewhere: Secondary | ICD-10-CM | POA: Diagnosis not present

## 2019-04-23 DIAGNOSIS — M6282 Rhabdomyolysis: Secondary | ICD-10-CM | POA: Diagnosis not present

## 2019-04-25 DIAGNOSIS — N3001 Acute cystitis with hematuria: Secondary | ICD-10-CM | POA: Diagnosis not present

## 2019-04-25 DIAGNOSIS — I441 Atrioventricular block, second degree: Secondary | ICD-10-CM | POA: Diagnosis not present

## 2019-04-25 DIAGNOSIS — Z7982 Long term (current) use of aspirin: Secondary | ICD-10-CM | POA: Diagnosis not present

## 2019-04-25 DIAGNOSIS — I129 Hypertensive chronic kidney disease with stage 1 through stage 4 chronic kidney disease, or unspecified chronic kidney disease: Secondary | ICD-10-CM | POA: Diagnosis not present

## 2019-04-25 DIAGNOSIS — Z7984 Long term (current) use of oral hypoglycemic drugs: Secondary | ICD-10-CM | POA: Diagnosis not present

## 2019-04-25 DIAGNOSIS — E1122 Type 2 diabetes mellitus with diabetic chronic kidney disease: Secondary | ICD-10-CM | POA: Diagnosis not present

## 2019-04-25 DIAGNOSIS — Z8616 Personal history of COVID-19: Secondary | ICD-10-CM | POA: Diagnosis not present

## 2019-04-25 DIAGNOSIS — N1831 Chronic kidney disease, stage 3a: Secondary | ICD-10-CM | POA: Diagnosis not present

## 2019-04-25 DIAGNOSIS — F329 Major depressive disorder, single episode, unspecified: Secondary | ICD-10-CM | POA: Diagnosis not present

## 2019-04-25 DIAGNOSIS — M1990 Unspecified osteoarthritis, unspecified site: Secondary | ICD-10-CM | POA: Diagnosis not present

## 2019-04-25 DIAGNOSIS — N400 Enlarged prostate without lower urinary tract symptoms: Secondary | ICD-10-CM | POA: Diagnosis not present

## 2019-04-25 DIAGNOSIS — F0150 Vascular dementia without behavioral disturbance: Secondary | ICD-10-CM | POA: Diagnosis not present

## 2019-04-25 DIAGNOSIS — E785 Hyperlipidemia, unspecified: Secondary | ICD-10-CM | POA: Diagnosis not present

## 2019-04-25 DIAGNOSIS — H259 Unspecified age-related cataract: Secondary | ICD-10-CM | POA: Diagnosis not present

## 2019-04-25 DIAGNOSIS — I69354 Hemiplegia and hemiparesis following cerebral infarction affecting left non-dominant side: Secondary | ICD-10-CM | POA: Diagnosis not present

## 2019-04-25 DIAGNOSIS — B961 Klebsiella pneumoniae [K. pneumoniae] as the cause of diseases classified elsewhere: Secondary | ICD-10-CM | POA: Diagnosis not present

## 2019-04-25 DIAGNOSIS — M6282 Rhabdomyolysis: Secondary | ICD-10-CM | POA: Diagnosis not present

## 2019-04-29 DIAGNOSIS — E1122 Type 2 diabetes mellitus with diabetic chronic kidney disease: Secondary | ICD-10-CM | POA: Diagnosis not present

## 2019-04-29 DIAGNOSIS — I129 Hypertensive chronic kidney disease with stage 1 through stage 4 chronic kidney disease, or unspecified chronic kidney disease: Secondary | ICD-10-CM | POA: Diagnosis not present

## 2019-04-29 DIAGNOSIS — B961 Klebsiella pneumoniae [K. pneumoniae] as the cause of diseases classified elsewhere: Secondary | ICD-10-CM | POA: Diagnosis not present

## 2019-04-29 DIAGNOSIS — M6282 Rhabdomyolysis: Secondary | ICD-10-CM | POA: Diagnosis not present

## 2019-04-29 DIAGNOSIS — N1831 Chronic kidney disease, stage 3a: Secondary | ICD-10-CM | POA: Diagnosis not present

## 2019-04-29 DIAGNOSIS — N3001 Acute cystitis with hematuria: Secondary | ICD-10-CM | POA: Diagnosis not present

## 2019-04-30 DIAGNOSIS — E1165 Type 2 diabetes mellitus with hyperglycemia: Secondary | ICD-10-CM | POA: Diagnosis not present

## 2019-04-30 DIAGNOSIS — N183 Chronic kidney disease, stage 3 unspecified: Secondary | ICD-10-CM | POA: Diagnosis not present

## 2019-05-06 DIAGNOSIS — B961 Klebsiella pneumoniae [K. pneumoniae] as the cause of diseases classified elsewhere: Secondary | ICD-10-CM | POA: Diagnosis not present

## 2019-05-06 DIAGNOSIS — N1831 Chronic kidney disease, stage 3a: Secondary | ICD-10-CM | POA: Diagnosis not present

## 2019-05-06 DIAGNOSIS — E1122 Type 2 diabetes mellitus with diabetic chronic kidney disease: Secondary | ICD-10-CM | POA: Diagnosis not present

## 2019-05-06 DIAGNOSIS — I129 Hypertensive chronic kidney disease with stage 1 through stage 4 chronic kidney disease, or unspecified chronic kidney disease: Secondary | ICD-10-CM | POA: Diagnosis not present

## 2019-05-06 DIAGNOSIS — N3001 Acute cystitis with hematuria: Secondary | ICD-10-CM | POA: Diagnosis not present

## 2019-05-06 DIAGNOSIS — M6282 Rhabdomyolysis: Secondary | ICD-10-CM | POA: Diagnosis not present

## 2019-05-14 DIAGNOSIS — N1831 Chronic kidney disease, stage 3a: Secondary | ICD-10-CM | POA: Diagnosis not present

## 2019-05-14 DIAGNOSIS — B961 Klebsiella pneumoniae [K. pneumoniae] as the cause of diseases classified elsewhere: Secondary | ICD-10-CM | POA: Diagnosis not present

## 2019-05-14 DIAGNOSIS — N3001 Acute cystitis with hematuria: Secondary | ICD-10-CM | POA: Diagnosis not present

## 2019-05-14 DIAGNOSIS — I129 Hypertensive chronic kidney disease with stage 1 through stage 4 chronic kidney disease, or unspecified chronic kidney disease: Secondary | ICD-10-CM | POA: Diagnosis not present

## 2019-05-14 DIAGNOSIS — M6282 Rhabdomyolysis: Secondary | ICD-10-CM | POA: Diagnosis not present

## 2019-05-14 DIAGNOSIS — E1122 Type 2 diabetes mellitus with diabetic chronic kidney disease: Secondary | ICD-10-CM | POA: Diagnosis not present

## 2019-05-15 DIAGNOSIS — L03114 Cellulitis of left upper limb: Secondary | ICD-10-CM | POA: Diagnosis not present

## 2019-05-16 ENCOUNTER — Emergency Department (HOSPITAL_COMMUNITY)
Admission: EM | Admit: 2019-05-16 | Discharge: 2019-05-16 | Disposition: A | Discharge status: Home / Self Care | Payer: Medicare Other | Attending: Emergency Medicine | Admitting: Emergency Medicine

## 2019-05-16 ENCOUNTER — Emergency Department (HOSPITAL_COMMUNITY): Payer: Medicare Other

## 2019-05-16 ENCOUNTER — Other Ambulatory Visit: Payer: Self-pay

## 2019-05-16 ENCOUNTER — Encounter (HOSPITAL_COMMUNITY): Payer: Self-pay | Admitting: Emergency Medicine

## 2019-05-16 DIAGNOSIS — M7022 Olecranon bursitis, left elbow: Secondary | ICD-10-CM | POA: Insufficient documentation

## 2019-05-16 DIAGNOSIS — M79603 Pain in arm, unspecified: Secondary | ICD-10-CM | POA: Diagnosis not present

## 2019-05-16 DIAGNOSIS — F0390 Unspecified dementia without behavioral disturbance: Secondary | ICD-10-CM | POA: Diagnosis not present

## 2019-05-16 DIAGNOSIS — I129 Hypertensive chronic kidney disease with stage 1 through stage 4 chronic kidney disease, or unspecified chronic kidney disease: Secondary | ICD-10-CM | POA: Diagnosis not present

## 2019-05-16 DIAGNOSIS — Z87891 Personal history of nicotine dependence: Secondary | ICD-10-CM | POA: Insufficient documentation

## 2019-05-16 DIAGNOSIS — N433 Hydrocele, unspecified: Secondary | ICD-10-CM | POA: Insufficient documentation

## 2019-05-16 DIAGNOSIS — M25522 Pain in left elbow: Secondary | ICD-10-CM | POA: Diagnosis not present

## 2019-05-16 DIAGNOSIS — Z79899 Other long term (current) drug therapy: Secondary | ICD-10-CM | POA: Insufficient documentation

## 2019-05-16 DIAGNOSIS — R58 Hemorrhage, not elsewhere classified: Secondary | ICD-10-CM | POA: Diagnosis not present

## 2019-05-16 DIAGNOSIS — N183 Chronic kidney disease, stage 3 unspecified: Secondary | ICD-10-CM | POA: Insufficient documentation

## 2019-05-16 DIAGNOSIS — Z7982 Long term (current) use of aspirin: Secondary | ICD-10-CM | POA: Insufficient documentation

## 2019-05-16 DIAGNOSIS — I10 Essential (primary) hypertension: Secondary | ICD-10-CM | POA: Diagnosis not present

## 2019-05-16 DIAGNOSIS — Y939 Activity, unspecified: Secondary | ICD-10-CM | POA: Insufficient documentation

## 2019-05-16 DIAGNOSIS — R000 Tachycardia, unspecified: Secondary | ICD-10-CM | POA: Diagnosis not present

## 2019-05-16 DIAGNOSIS — Z794 Long term (current) use of insulin: Secondary | ICD-10-CM | POA: Insufficient documentation

## 2019-05-16 DIAGNOSIS — E1122 Type 2 diabetes mellitus with diabetic chronic kidney disease: Secondary | ICD-10-CM | POA: Diagnosis not present

## 2019-05-16 DIAGNOSIS — Z8616 Personal history of COVID-19: Secondary | ICD-10-CM | POA: Insufficient documentation

## 2019-05-16 DIAGNOSIS — M7989 Other specified soft tissue disorders: Secondary | ICD-10-CM | POA: Diagnosis not present

## 2019-05-16 DIAGNOSIS — R52 Pain, unspecified: Secondary | ICD-10-CM | POA: Diagnosis not present

## 2019-05-16 NOTE — ED Triage Notes (Signed)
LT arm / elbow pain and swelling x 3 days, denies injury.  Pt's caregiver also reports some irritation and bleeding from scrotum.

## 2019-05-16 NOTE — ED Provider Notes (Signed)
[facility] Provider Note   CSN: [id number] Arrival date & time: 05/16/19  1213   History Chief Complaint  Patient presents with  . Arm Pain    Mike Briggs is a 86 y.o. male with history of DM, CKD, CVA with right sided weakness and vascular dementia who presents with left elbow pain and bleeding from the scrotum.  History is limited due to dementia.  Patient states that his left elbow hurts.  I spoke with his niece on the phone and she states that yesterday he told her about it and she inspected the area and it looks swollen.  Patient is relatively inactive at home and has several caregivers.  He was called in some antibiotics but she is unsure of what he is taking. As far she knows he has not had a fever or been ill. Today she was called by one of the caregivers and told that he was bleeding in the groin area. She states that he has had problems with swelling of the testicles for about a year and she is unsure of what his diagnosis is. Per chart review he had an ultrasound of the testicles in November which showed moderate to large bilateral hydroceles.  Level 5 caveat due to dementia  HPI     Past Medical History:  Diagnosis Date  . Arthritis   . Cataract   . Chronic kidney disease   . COVID-19   . Depression   . Diabetes mellitus   . Hyperlipidemia   . Hypertension   . Rhabdomyolysis 11/02/2018  . Stroke [facility])     Patient Active Problem List   Diagnosis Date Noted  . Klebsiella UTI  03/25/2019  . Hypomagnesemia 03/24/2019  . Acute UTI 03/23/2019  . Acute cystitis 12/17/2018  . Mobitz type 1 second degree atrioventricular block   . TIA (transient ischemic attack) 11/14/2018  . Acute encephalopathy 11/14/2018  . Sacral decubitus ulcer, stage III ([place]) 11/11/2018  . Pressure injury of skin 11/08/2018  . COVID-19 virus infection 11/03/2018  . COVID-19 virus detected 11/03/2018  . Gram-positive bacteremia 11/03/2018  . Dehydration 11/02/2018    . Rhabdomyolysis 11/02/2018  . UTI (urinary tract infection) 02/26/2018  . Syncope 02/25/2018  . Vitamin D deficiency 05/31/2016  . Essential hypertension 05/22/2016  . Hemiparesis affecting right side as late effect of cerebrovascular accident (CVA) ([place]) 05/22/2016  . Personal history of gout 05/22/2016  . BPH (benign prostatic hyperplasia) 05/22/2016  . Chronic constipation 05/22/2016  . HLD (hyperlipidemia) 05/22/2016  . Pseudophakia 12/06/2015  . Vascular dementia ([place]) 10/23/2012  . Chronic kidney disease, stage III (moderate) ([place]) 10/20/2012  . Type 2 diabetes mellitus with diabetic chronic kidney disease ([place]) 12/16/2008  . ANKLE, ARTHRITIS, DEGEN./OSTEO 12/16/2008    Past Surgical History:  Procedure Laterality Date  . BACK SURGERY     cervical and lumbar  . CATARACT EXTRACTION    . CHOLECYSTECTOMY    . FOOT SURGERY     5th toe   . SPINE SURGERY     lumbar and cervical fusions      Family History  Family history unknown: Yes    Social History   Tobacco Use  . Smoking status: Former Smoker    Quit date: 08/07/1967    Years since quitting: 51.8  . Smokeless tobacco: Never Used  Substance Use Topics  . Alcohol use: No  . Drug use: No    Home Medications Prior to Admission medications   Medication Sig Start  Date End Date Taking? Authorizing Provider  acetaminophen (TYLENOL) 325 MG tablet Take 2 tablets (650 mg total) by mouth every 6 (six) hours as needed for mild pain (or Fever >/= 101). 03/25/19   Emokpae, Courage, MD  amLODipine (NORVASC) 10 MG tablet Take 1 tablet (10 mg total) by mouth daily. 03/25/19   Roxan Hockey, MD  aspirin 81 MG chewable tablet Chew 1 tablet (81 mg total) by mouth daily with breakfast. 03/25/19   Roxan Hockey, MD  atorvastatin (LIPITOR) 10 MG tablet Take 1 tablet (10 mg total) by mouth every evening. 03/25/19   Emokpae, Courage, MD  LANTUS SOLOSTAR 100 UNIT/ML Solostar Pen Inject 5 Units into the skin daily. 04/01/19   [provider]  lisinopril (ZESTRIL) 40 MG tablet Take 1 tablet (40 mg total) by mouth daily. 03/25/19   Roxan Hockey, MD  memantine (NAMENDA) 10 MG tablet Take 10 mg by mouth 2 (two) times daily.  04/18/17   [provider]  metFORMIN (GLUCOPHAGE) 500 MG tablet Take 1 tablet (500 mg total) by mouth 2 (two) times daily with a meal. 03/25/19 03/24/20  Roxan Hockey, MD  metoprolol succinate (TOPROL-XL) 25 MG 24 hr tablet Take 1 tablet (25 mg total) by mouth daily. 03/25/19   Roxan Hockey, MD  tamsulosin (FLOMAX) 0.4 MG CAPS capsule Take 1 capsule (0.4 mg total) by mouth daily after breakfast. 03/25/19   Roxan Hockey, MD  vitamin C (VITAMIN C) 500 MG tablet Take 1 tablet (500 mg total) by mouth daily. 11/19/18   Eugenie Filler, MD  zinc sulfate 220 (50 Zn) MG capsule Take 1 capsule (220 mg total) by mouth daily. 03/25/19   Roxan Hockey, MD    Allergies    Patient has no known allergies.  Review of Systems   Review of Systems  Unable to perform ROS: Dementia    Physical Exam Updated Vital Signs BP (!) 142/87 (BP Location: Right Arm)   Pulse 100   Temp 99 F (37.2 C) (Oral)   Resp 18   Ht 5\' 11"  (1.803 m)   Wt 96 kg   SpO2 95%   BMI 29.52 kg/m   Physical Exam Vitals and nursing note reviewed.  Constitutional:      General: He is not in acute distress.    Appearance: Normal appearance. He is well-developed. He is not ill-appearing.     Comments: Alert. Calm and cooperative.  HENT:     Head: Normocephalic and atraumatic.  Eyes:     General: No scleral icterus.       Right eye: No discharge.        Left eye: No discharge.     Conjunctiva/sclera: Conjunctivae normal.     Pupils: Pupils are equal, round, and reactive to light.  Cardiovascular:     Rate and Rhythm: Normal rate and regular rhythm.  Pulmonary:     Effort: Pulmonary effort is normal. No respiratory distress.     Breath sounds: Normal breath sounds.  Abdominal:     General: There is no  distension.     Palpations: Abdomen is soft.     Tenderness: There is no abdominal tenderness.  Genitourinary:    Comments: Enlargement of the bilateral testicles without focal tenderness or redness. There is a small appearing abrasion on the left testicle which looks consistent with a scratch. There are several twigs and there is food in his diaper (looks like pasta). Musculoskeletal:     Cervical back: Normal range of motion.  Comments: Left elbow: Moderate swelling over the olecranon. No significant redness, drainage, warmth. FROM of the elbow. 2+ radial pulse  Skin:    General: Skin is warm and dry.  Neurological:     Mental Status: He is alert and oriented to person, place, and time.  Psychiatric:        Behavior: Behavior normal.     ED Results / Procedures / Treatments   Labs (all labs ordered are listed, but only abnormal results are displayed) Labs Reviewed - No data to display  EKG None  Radiology DG Elbow Complete Left  Result Date: 05/16/2019 CLINICAL DATA:  Left elbow pain and swelling. No known injury. EXAM: LEFT ELBOW - COMPLETE 3+ VIEW COMPARISON:  None. FINDINGS: Posterior soft tissue swelling, most pronounced at the level of the olecranon. Moderate-sized olecranon enthesophyte. No true lateral view available. The submitted lateral views or oblique. No gross effusion seen. IMPRESSION: Posterior soft tissue swelling with an appearance suggesting olecranon bursitis. Electronically Signed   By: Claudie Revering M.D.   On: 05/16/2019 14:05    Procedures Procedures (including critical care time)  Medications Ordered in ED Medications - No data to display  ED Course  I have reviewed the triage vital signs and the nursing notes.  Pertinent labs & imaging results that were available during my care of the patient were reviewed by me and considered in my medical decision making (see chart for details).  86 year old male presents with left elbow swelling and concern for  bleeding of the testicles.  On exam he appears to have an olecranon bursitis.  X-ray was obtained to rule out any trauma since he is not a good historian from his dementia.  X-ray suggest olecranon bursitis as well.  On examination of the testicles they appear to be enlarged bilaterally.  Per chart review he has moderate to large hydroceles.  They are nontender.  He has a scratch on the testicles which could be where he is bleeding.  Discussed his work-up with his niece here.  Advised compression of the elbow to provide protection and symptomatic relief.  Also they were given a urology referral to follow-up about the hydroceles.  MDM Rules/Calculators/A&P                       Final Clinical Impression(s) / ED Diagnoses Final diagnoses:  Olecranon bursitis of left elbow  Hydrocele, unspecified hydrocele type    Rx / DC Orders ED Discharge Orders    None       Recardo Evangelist, PA-C 05/16/19 1602    Margette Fast, MD 05/17/19 [phone]

## 2019-05-16 NOTE — Discharge Instructions (Addendum)
Please apply an ace wrap or elbow sleeve to provide compression and protection to the elbow and help with swelling. Give Tylenol for pain as needed. Please make an appointment with your doctor if it does not improve over the next week Make an appointment with the urologist to evaluate the hydroceles of the testicles. You can try a jock strap to see if this provides any relief.

## 2019-05-27 ENCOUNTER — Telehealth: Payer: Self-pay

## 2019-05-27 ENCOUNTER — Other Ambulatory Visit: Payer: Self-pay

## 2019-05-27 ENCOUNTER — Ambulatory Visit (INDEPENDENT_AMBULATORY_CARE_PROVIDER_SITE_OTHER): Payer: Medicare Other | Admitting: Family Medicine

## 2019-05-27 ENCOUNTER — Other Ambulatory Visit: Payer: Self-pay | Admitting: Family Medicine

## 2019-05-27 ENCOUNTER — Ambulatory Visit (HOSPITAL_COMMUNITY)
Admission: RE | Admit: 2019-05-27 | Discharge: 2019-05-27 | Disposition: A | Discharge status: Home / Self Care | Payer: Medicare Other | Source: Ambulatory Visit | Attending: Family Medicine | Admitting: Family Medicine

## 2019-05-27 ENCOUNTER — Encounter: Payer: Self-pay | Admitting: Family Medicine

## 2019-05-27 VITALS — BP 124/62 | HR 79 | Temp 97.8°F | Ht 71.0 in | Wt 219.4 lb

## 2019-05-27 DIAGNOSIS — N433 Hydrocele, unspecified: Secondary | ICD-10-CM

## 2019-05-27 DIAGNOSIS — M7989 Other specified soft tissue disorders: Secondary | ICD-10-CM | POA: Insufficient documentation

## 2019-05-27 DIAGNOSIS — M79644 Pain in right finger(s): Secondary | ICD-10-CM | POA: Insufficient documentation

## 2019-05-27 DIAGNOSIS — I10 Essential (primary) hypertension: Secondary | ICD-10-CM | POA: Diagnosis not present

## 2019-05-27 DIAGNOSIS — N184 Chronic kidney disease, stage 4 (severe): Secondary | ICD-10-CM | POA: Diagnosis not present

## 2019-05-27 DIAGNOSIS — R600 Localized edema: Secondary | ICD-10-CM | POA: Diagnosis not present

## 2019-05-27 DIAGNOSIS — E1122 Type 2 diabetes mellitus with diabetic chronic kidney disease: Secondary | ICD-10-CM

## 2019-05-27 HISTORY — DX: Pain in right finger(s): M79.644

## 2019-05-27 HISTORY — DX: Other specified soft tissue disorders: M79.89

## 2019-05-27 MED ORDER — LANTUS SOLOSTAR 100 UNIT/ML ~~LOC~~ SOPN: 20.0000 [IU] | PEN_INJECTOR | Freq: Every day | SUBCUTANEOUS | 3 refills | Status: DC

## 2019-05-27 NOTE — Progress Notes (Signed)
LVM to return call.

## 2019-05-27 NOTE — Assessment & Plan Note (Signed)
Controlled today-continue all current medications.  Will visit medication list in detail once a day goes to the pharmacy to get a prescription print out. Encouraged to avoid salt and to use arms and legs even with sitting to help with joint health and swelling.

## 2019-05-27 NOTE — Assessment & Plan Note (Signed)
Recent A1c was 6.8%.  Decreasing Lantus to 20 units, 5 units was listed in the chart however the aide reports that she has been giving 22 units daily. We will follow up with a recheck of the A1c encouraged a diabetic friendly diet. Is on a statin medication at this time.  Next appointment can foot check.
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
## 2019-05-27 NOTE — Progress Notes (Signed)
Subjective:  Patient ID: Mike Briggs, male    DOB: 1928/10/23  Age: 86 y.o. MRN: [id number]  CC:  Chief Complaint  Patient presents with  . New Patient (Initial Visit)    previous dr Legrand Rams pt right arm is swollen has been going on for awhile started in elbow now goes down to his finger       HPI  HPI Mike Briggs is an 86 year old male patient who presents today to establish care.  Previously was a patient of Dr. Josephine Cables however he has not seen him in at least 4 months or more. He presents today with his aide who is with him 8 hours a day.  She reports she has been with him for the last 4 months and this is one of his first doctors appointments that she is taking him to.  [place] have help make decisions but do not visit him often.  He reports limited interaction when asked about this.  Additionally he has a life alert and a security measure set up at home if he needs something. In the chart it is noted that he has dementia-of which it appears he is taking Namenda for. His aide here today reports that she has limited knowledge of most of his medical history but she reports that his memory appears to be stable and in good condition to her. She does not manage his medications they come bubble packed. Reports he does not have any problems taking his medications.  Does not have any problems eating and feeds himself. There is a little trouble with bladder habits urinates a lot wears a depends to keep from having accidents.  Constipation is also an issue. He has very limited mobility since having his stroke.  Reports that he is in a wheelchair especially with walking long distances can pivot turn.  Has limited leg swelling but they will swell at times. Blood sugars usually run 1 4160 in the mornings fasting.  Sometimes get into the 200s in the evenings.  Is taking insulin 22 units daily.  Last A1c in February was 6.8%.  There is been no reports of hypoglycemia, excessive  hyperglycemia.  Aide is unsure of which particular medications he is taking though she reports that she knows is not taking Metformin.  She is advised to go to the pharmacy to get a print out and then call us or bring it back by so that we can know which medications he is on and to bring the medicine bottles next visit.  Today his biggest issues is his right hand.  It appears to be more swollen he is having difficulty gripping more than he usually does even though he has a poor grip since his stroke.  He predominantly reports discomfort across the last 2 metacarpal s and predominate pinky finger.  He denies having any injury or trauma to the area.  Though there is noted discoloration in the pinky and swelling/slight deformity in the joint.  His left hand is also swollen but not as much.  It is nonpitting on both sides.  Pulses are good on both sides.  Denies having any falls aide reports that he has not had any falls. Denies having any skin issues even though he has a history of having pressure ulcers.  Last seen in the emergency room back on 417 for bursitis of the left elbow which was bothering him he was also found to have hydrocele and was set up to follow-up with  urology but has not done that at this time.  Aide was unaware that she needed to have him be seen.  Prior to that visit he was seen in March brought in by EMS per patient's aide checked him was breathing hard while he was sleeping blood sugar was found to have 291.  Patient was alert and awake and reported that his blood sugars have been running high otherwise there are no other complaints.  Was advised to follow-up with Dr. Legrand Rams but no appointments were ever made.  And around 1 month prior to that he was hospitalized with a UTI for 2 days at [facility].  Today outside of having hand pain he does not have anything else to report.  Very limited in conversation outside of asking questions.  He does answer them somewhat  appropriately.  Today patient denies signs and symptoms of COVID 19 infection including fever, chills, cough, shortness of breath, and headache. Past Medical, Surgical, Social History, Allergies, and Medications have been Reviewed.   Past Medical History:  Diagnosis Date  . Acute cystitis 12/17/2018  . Acute encephalopathy 11/14/2018  . ANKLE, ARTHRITIS, DEGEN./OSTEO 12/16/2008   Qualifier: Diagnosis of  By: Aline Brochure MD, Dorothyann Peng    . Arthritis   . Cataract   . Chronic kidney disease   . COVID-19   . COVID-19 virus detected 11/03/2018  . COVID-19 virus infection 11/03/2018  . Dehydration 11/02/2018  . Depression   . Diabetes mellitus   . Gram-positive bacteremia 11/03/2018  . Hyperlipidemia   . Hypertension   . Hypomagnesemia 03/24/2019  . Klebsiella UTI  03/25/2019  . Rhabdomyolysis 11/02/2018  . Stroke ([place])   . Syncope 02/25/2018  . TIA (transient ischemic attack) 11/14/2018  . UTI (urinary tract infection) 02/26/2018    Current Meds  Medication Sig  . acetaminophen (TYLENOL) 325 MG tablet Take 2 tablets (650 mg total) by mouth every 6 (six) hours as needed for mild pain (or Fever >/= 101).  Marland Kitchen amLODipine (NORVASC) 10 MG tablet Take 1 tablet (10 mg total) by mouth daily.  Marland Kitchen aspirin 81 MG chewable tablet Chew 1 tablet (81 mg total) by mouth daily with breakfast.  . atorvastatin (LIPITOR) 10 MG tablet Take 1 tablet (10 mg total) by mouth every evening.  Marland Kitchen LANTUS SOLOSTAR 100 UNIT/ML Solostar Pen Inject 20 Units into the skin daily.  Marland Kitchen lisinopril (ZESTRIL) 40 MG tablet Take 1 tablet (40 mg total) by mouth daily.  . memantine (NAMENDA) 10 MG tablet Take 10 mg by mouth 2 (two) times daily.   . metoprolol succinate (TOPROL-XL) 25 MG 24 hr tablet Take 1 tablet (25 mg total) by mouth daily.  . tamsulosin (FLOMAX) 0.4 MG CAPS capsule Take 1 capsule (0.4 mg total) by mouth daily after breakfast.  . vitamin C (VITAMIN C) 500 MG tablet Take 1 tablet (500 mg total) by mouth daily.  Marland Kitchen zinc  sulfate 220 (50 Zn) MG capsule Take 1 capsule (220 mg total) by mouth daily.  . [DISCONTINUED] LANTUS SOLOSTAR 100 UNIT/ML Solostar Pen Inject 5 Units into the skin daily.    ROS:  Review of Systems  Constitutional: Negative.   HENT: Negative.   Eyes: Negative.   Respiratory: Negative.   Cardiovascular: Negative.   Gastrointestinal: Negative.   Genitourinary: Negative.   Musculoskeletal: Positive for joint pain.  Skin: Negative.   Neurological: Negative.   Endo/Heme/Allergies: Negative.   Psychiatric/Behavioral: Positive for memory loss.  All other systems reviewed and are negative.    Objective:  Today's Vitals: BP 124/62 (BP Location: Left Arm, Patient Position: Sitting, Cuff Size: Normal)   Pulse 79   Temp 97.8 F (36.6 C) (Temporal)   Ht 5\' 11"  (1.803 m)   Wt 219 lb 6.4 oz (99.5 kg)   SpO2 99%   BMI 30.60 kg/m  Vitals with BMI 05/27/2019 05/16/2019 05/16/2019  Height 5\' 11"  - -  Weight 219 lbs 6 oz - -  BMI 11.94 - -  Systolic 174 081 -  Diastolic 62 61 -  Pulse 79 78 81     Physical Exam Vitals and nursing note reviewed.  Constitutional:      Appearance: Normal appearance. He is obese.  HENT:     Head: Normocephalic and atraumatic.     Right Ear: External ear normal.     Left Ear: External ear normal.     Nose: Nose normal.     Mouth/Throat:     Pharynx: Oropharynx is clear.     Comments: Mask in place  Eyes:     General:        Right eye: No discharge.        Left eye: No discharge.     Conjunctiva/sclera: Conjunctivae normal.  Cardiovascular:     Rate and Rhythm: Normal rate and regular rhythm.     Pulses: Normal pulses.     Heart sounds: Normal heart sounds.  Pulmonary:     Effort: Pulmonary effort is normal.     Breath sounds: Normal breath sounds.  Musculoskeletal:     Right hand: Swelling, deformity, tenderness and bony tenderness present. Decreased range of motion. Decreased strength of finger abduction, thumb/finger opposition and wrist  extension. Decreased capillary refill. Normal pulse.     Left hand: Swelling present. Decreased range of motion. Decreased strength of finger abduction, thumb/finger opposition and wrist extension. Decreased capillary refill. Normal pulse.     Cervical back: Normal range of motion and neck supple.     Comments: There is noted discoloration to the fifth digit on the Right hand. Swelling is more predominate in this hand and finger. Poor grip. Pulse is present, however, fingers are cool to touch.   Skin:    General: Skin is warm.  Neurological:     General: No focal deficit present.     Mental Status: He is alert and oriented to person, place, and time.  Psychiatric:        Mood and Affect: Mood normal. Affect is flat.        Behavior: Behavior normal. Behavior is cooperative.        Thought Content: Thought content normal.        Judgment: Judgment normal.     Assessment   1. Type 2 diabetes mellitus with stage 4 chronic kidney disease, without long-term current use of insulin (HCC)   2. Hydrocele, unspecified hydrocele type   3. Swelling of both hands   4. Pain in finger of right hand   5. Essential hypertension     Tests ordered Orders Placed This Encounter  Procedures  . DG Hand Complete Right  . DG Hand Complete Left  . Ambulatory referral to Urology     Plan: Please see assessment and plan per problem list above.   Meds ordered this encounter  Medications  . LANTUS SOLOSTAR 100 UNIT/ML Solostar Pen    Sig: Inject 20 Units into the skin daily.    Dispense:  15 mL    Refill:  3    Order  Specific Question:   Supervising Provider    Answer:   Fayrene Helper [[id number]]    Patient to follow-up in 1.5-2 months.  Perlie Mayo, NP

## 2019-05-27 NOTE — Assessment & Plan Note (Signed)
Noted bilateral hand swelling will be getting x-rays.

## 2019-05-27 NOTE — Patient Instructions (Addendum)
I appreciate the opportunity to provide you with care for your health and wellness. Today we discussed: established care  Follow up: 1.5-2 months -fasting   No labs or referrals today  Xray's of both hands today at [facility] we will see if anything needs to be done, like a referral to Ortho.  Insulin reduced to 20 units daily-A1c in Feb was a little low-so he might have low blood sugar at times which is dangerous, so we ill back off a little on insulin and recheck at next appt.  Pain Tylenol 500-1,000 mg twice daily as needed.( at least 6-12 hours apart)  Please continue to practice social distancing to keep you, your family, and our community safe.  If you must go out, please wear a mask and practice good handwashing.  It was a pleasure to see you and I look forward to continuing to work together on your health and well-being. Please do not hesitate to call the office if you need care or have questions about your care.  Have a wonderful day and week. With Gratitude, Cherly Beach, DNP, AGNP-BC

## 2019-05-27 NOTE — Assessment & Plan Note (Signed)
Referral to urology placed.  For hydrocele.

## 2019-05-27 NOTE — Assessment & Plan Note (Signed)
Questionable injury to the right pinky finger/hand.  Or metacarpal.  Discoloration noted please see HPI for further detail.  Getting x-rays today to identify possible cause.  Denies having any injury or trauma however he does have memory trouble and is unaware of what might of happened.  Does not have anyone at home with him in the evening time.

## 2019-05-29 ENCOUNTER — Other Ambulatory Visit: Payer: Self-pay

## 2019-05-29 ENCOUNTER — Ambulatory Visit (INDEPENDENT_AMBULATORY_CARE_PROVIDER_SITE_OTHER): Payer: Medicare Other | Admitting: Orthopedic Surgery

## 2019-05-29 VITALS — BP 121/56 | HR 73 | Temp 97.1°F

## 2019-05-29 DIAGNOSIS — M19049 Primary osteoarthritis, unspecified hand: Secondary | ICD-10-CM

## 2019-05-29 DIAGNOSIS — S62366A Nondisplaced fracture of neck of fifth metacarpal bone, right hand, initial encounter for closed fracture: Secondary | ICD-10-CM

## 2019-05-29 MED ORDER — MELOXICAM 7.5 MG PO TABS: 7.5000 mg | ORAL_TABLET | Freq: Every day | ORAL | 5 refills | Status: DC

## 2019-05-29 NOTE — Patient Instructions (Signed)
Arthritis of the hand   Fracture rt hand is subacute needs no treatment

## 2019-05-29 NOTE — Progress Notes (Signed)
NEW PROBLEM//OFFICE VISIT  Chief Complaint  Patient presents with  . Hand Pain    Right hand pain,     86 year old male presents for evaluation of a fractured right fifth metacarpal but no time.  Of when he did it and he does not remember it.  Does complain of bilateral hand pain which he has had for several years seems to be associated with stiffness and swelling   Review of Systems  Gastrointestinal: Positive for constipation.  Musculoskeletal: Positive for falls and myalgias.  Psychiatric/Behavioral: Positive for memory loss.     Past Medical History:  Diagnosis Date  . Acute cystitis 12/17/2018  . Acute encephalopathy 11/14/2018  . ANKLE, ARTHRITIS, DEGEN./OSTEO 12/16/2008   Qualifier: Diagnosis of  By: Aline Brochure MD, Dorothyann Peng    . Arthritis   . Cataract   . Chronic kidney disease   . COVID-19   . COVID-19 virus detected 11/03/2018  . COVID-19 virus infection 11/03/2018  . Dehydration 11/02/2018  . Depression   . Diabetes mellitus   . Gram-positive bacteremia 11/03/2018  . Hyperlipidemia   . Hypertension   . Hypomagnesemia 03/24/2019  . Klebsiella UTI  03/25/2019  . Rhabdomyolysis 11/02/2018  . Stroke ([place])   . Syncope 02/25/2018  . TIA (transient ischemic attack) 11/14/2018  . UTI (urinary tract infection) 02/26/2018    Past Surgical History:  Procedure Laterality Date  . BACK SURGERY     cervical and lumbar  . CATARACT EXTRACTION    . CHOLECYSTECTOMY    . FOOT SURGERY     5th toe   . SPINE SURGERY     lumbar and cervical fusions    Family History  Family history unknown: Yes   Social History   Tobacco Use  . Smoking status: Former Smoker    Quit date: 08/07/1967    Years since quitting: 51.8  . Smokeless tobacco: Never Used  Substance Use Topics  . Alcohol use: No  . Drug use: No    No Known Allergies  Current Meds  Medication Sig  . aspirin 81 MG chewable tablet Chew 1 tablet (81 mg total) by mouth daily with breakfast.  . atorvastatin (LIPITOR)  10 MG tablet Take 1 tablet (10 mg total) by mouth every evening.  Marland Kitchen LANTUS SOLOSTAR 100 UNIT/ML Solostar Pen Inject 20 Units into the skin daily.  Marland Kitchen lisinopril (ZESTRIL) 40 MG tablet Take 1 tablet (40 mg total) by mouth daily.  . memantine (NAMENDA) 10 MG tablet Take 10 mg by mouth 2 (two) times daily.   . metoprolol succinate (TOPROL-XL) 25 MG 24 hr tablet Take 1 tablet (25 mg total) by mouth daily.  . tamsulosin (FLOMAX) 0.4 MG CAPS capsule Take 1 capsule (0.4 mg total) by mouth daily after breakfast.  . vitamin C (VITAMIN C) 500 MG tablet Take 1 tablet (500 mg total) by mouth daily.  Marland Kitchen zinc sulfate 220 (50 Zn) MG capsule Take 1 capsule (220 mg total) by mouth daily.    BP (!) 121/56   Pulse 73   Temp (!) 97.1 F (36.2 C)   Physical Exam He comes to [place] in a wheelchair. Ortho Exam  He seems oriented to person and place question time mood is pleasant affect is flat  His right and left hand are both swollen he has decreased range of motion globally in all joints MP PIP DIP no instability detected in the wrist prescription strength is weak because he cannot make a full fist there is no atrophy.  Skin  is clean temperature is normal sensation is intact  MEDICAL DECISION MAKING  A.  Encounter Diagnoses  Name Primary?  Marland Kitchen Arthritis of hand Yes  . Closed nondisplaced fracture of neck of fifth metacarpal bone of right hand, initial encounter     B. DATA ANALYSED:  IMAGING: Independent interpretation of images: Both hand x-ray shows soft tissue swelling with no periarticular erosions  there is also a fracture fifth metacarpal near the neck with no displacement or angulation it appears to be subacute Orders:   Outside records reviewed: no  C. MANAGEMENT   I suspect he has some inflammatory arthritis.  Recommend meloxicam.  The fracture does not the medial treatment as it subacute  Meds ordered this encounter  Medications  . meloxicam (MOBIC) 7.5 MG tablet    Sig: Take 1 tablet  (7.5 mg total) by mouth daily.    Dispense:  30 tablet    Refill:  5      Arther Abbott, MD  05/29/2019 10:43 AM

## 2019-06-05 ENCOUNTER — Ambulatory Visit
Admission: EM | Admit: 2019-06-05 | Discharge: 2019-06-05 | Disposition: A | Discharge status: Home / Self Care | Payer: Medicare Other | Attending: Emergency Medicine | Admitting: Emergency Medicine

## 2019-06-05 ENCOUNTER — Other Ambulatory Visit: Payer: Self-pay

## 2019-06-05 DIAGNOSIS — N390 Urinary tract infection, site not specified: Secondary | ICD-10-CM | POA: Diagnosis not present

## 2019-06-05 LAB — POCT URINALYSIS DIP (MANUAL ENTRY)
Bilirubin, UA: NEGATIVE
Glucose, UA: NEGATIVE mg/dL
Ketones, POC UA: NEGATIVE mg/dL
Nitrite, UA: POSITIVE — AB
Protein Ur, POC: 100 mg/dL — AB
Spec Grav, UA: 1.025 (ref 1.010–1.025)
Urobilinogen, UA: 0.2 E.U./dL
pH, UA: 7.5 (ref 5.0–8.0)

## 2019-06-05 MED ORDER — CIPROFLOXACIN HCL 250 MG PO TABS: 250.0000 mg | ORAL_TABLET | Freq: Two times a day (BID) | ORAL | 0 refills | Status: AC

## 2019-06-05 NOTE — ED Provider Notes (Addendum)
[facility]   CC: Burning with urination  SUBJECTIVE:  Mike Briggs is a 86 y.o. male who presented to the urgent care with right his caregiver with a complaint of dark brown and malodorous urine for the past few days.  Patient denies a precipitating event, recent sexual encounter, excessive caffeine intake. Has not tried OTC medications.  Symptoms are made worse with urination.  Admits to similar symptoms in the past.  Denies fever, chills, nausea, vomiting, abdominal pain, flank pain, bleeding, hematuria.    LMP: No LMP for male patient.  ROS: As in HPI.  All other pertinent ROS negative.     Past Medical History:  Diagnosis Date  . Acute cystitis 12/17/2018  . Acute encephalopathy 11/14/2018  . ANKLE, ARTHRITIS, DEGEN./OSTEO 12/16/2008   Qualifier: Diagnosis of  By: Aline Brochure MD, Dorothyann Peng    . Arthritis   . Cataract   . Chronic kidney disease   . COVID-19   . COVID-19 virus detected 11/03/2018  . COVID-19 virus infection 11/03/2018  . Dehydration 11/02/2018  . Depression   . Diabetes mellitus   . Gram-positive bacteremia 11/03/2018  . Hyperlipidemia   . Hypertension   . Hypomagnesemia 03/24/2019  . Klebsiella UTI  03/25/2019  . Rhabdomyolysis 11/02/2018  . Stroke ([place])   . Syncope 02/25/2018  . TIA (transient ischemic attack) 11/14/2018  . UTI (urinary tract infection) 02/26/2018   Past Surgical History:  Procedure Laterality Date  . BACK SURGERY     cervical and lumbar  . CATARACT EXTRACTION    . CHOLECYSTECTOMY    . FOOT SURGERY     5th toe   . SPINE SURGERY     lumbar and cervical fusions   No Known Allergies No current facility-administered medications on file prior to encounter.   Current Outpatient Medications on File Prior to Encounter  Medication Sig Dispense Refill  . acetaminophen (TYLENOL) 325 MG tablet Take 2 tablets (650 mg total) by mouth every 6 (six) hours as needed for mild pain (or Fever >/= 101). 12 tablet 0  . amLODipine (NORVASC) 10  MG tablet Take 1 tablet (10 mg total) by mouth daily. 30 tablet 2  . aspirin 81 MG chewable tablet Chew 1 tablet (81 mg total) by mouth daily with breakfast. 30 tablet 3  . atorvastatin (LIPITOR) 10 MG tablet Take 1 tablet (10 mg total) by mouth every evening. 30 tablet 5  . LANTUS SOLOSTAR 100 UNIT/ML Solostar Pen Inject 20 Units into the skin daily. 15 mL 3  . lisinopril (ZESTRIL) 40 MG tablet Take 1 tablet (40 mg total) by mouth daily. 30 tablet 3  . meloxicam (MOBIC) 7.5 MG tablet Take 1 tablet (7.5 mg total) by mouth daily. 30 tablet 5  . memantine (NAMENDA) 10 MG tablet Take 10 mg by mouth 2 (two) times daily.     . metFORMIN (GLUCOPHAGE) 500 MG tablet Take 1 tablet (500 mg total) by mouth 2 (two) times daily with a meal. (Patient not taking: Reported on 05/27/2019) 60 tablet 5  . metoprolol succinate (TOPROL-XL) 25 MG 24 hr tablet Take 1 tablet (25 mg total) by mouth daily. 30 tablet 3  . tamsulosin (FLOMAX) 0.4 MG CAPS capsule Take 1 capsule (0.4 mg total) by mouth daily after breakfast. 30 capsule 3  . vitamin C (VITAMIN C) 500 MG tablet Take 1 tablet (500 mg total) by mouth daily.    Marland Kitchen zinc sulfate 220 (50 Zn) MG capsule Take 1 capsule (220 mg total) by  mouth daily.     Social History   Socioeconomic History  . Marital status: Divorced    Spouse name: Not on file  . Number of children: 3  . Years of education: 10  . Highest education level: Not on file  Occupational History  . Occupation: Retired.  Tobacco Use  . Smoking status: Former Smoker    Quit date: 08/07/1967    Years since quitting: 51.8  . Smokeless tobacco: Never Used  Substance and Sexual Activity  . Alcohol use: No  . Drug use: No  . Sexual activity: Not Currently  Other Topics Concern  . Not on file  Social History Narrative   Wheelchair bound.Has not walked in many years, at least five years.  Lives alone. Has 2 aides that come in to help him.   Lives alone.    Retired.   Eats all food groups.    Has 3  children but does not have contact with them.   Divorced.   Social Determinants of Health   Financial Resource Strain:   . Difficulty of Paying Living Expenses:   Food Insecurity:   . Worried About Charity fundraiser in the Last Year:   . Arboriculturist in the Last Year:   Transportation Needs: No Transportation Needs  . Lack of Transportation (Medical): No  . Lack of Transportation (Non-Medical): No  Physical Activity: Inactive  . Days of Exercise per Week: 0 days  . Minutes of Exercise per Session: 0 min  Stress: No Stress Concern Present  . Feeling of Stress : Not at all  Social Connections: Moderately Isolated  . Frequency of Communication with Friends and Family: Once a week  . Frequency of Social Gatherings with Friends and Family: Once a week  . Attends Religious Services: More than 4 times per year  . Active Member of Clubs or Organizations: No  . Attends Archivist Meetings: Never  . Marital Status: Divorced  Human resources officer Violence:   . Fear of Current or Ex-Partner:   . Emotionally Abused:   Marland Kitchen Physically Abused:   . Sexually Abused:    Family History  Family history unknown: Yes    OBJECTIVE:  Vitals:   06/05/19 1049  BP: 138/86  Pulse: 75  Resp: 20  Temp: 98.8 F (37.1 C)  SpO2: 93%   General appearance: AOx3 in no acute distress HEENT: NCAT.  Oropharynx clear.  Lungs: clear to auscultation bilaterally without adventitious breath sounds Heart: regular rate and rhythm.  Radial pulses 2+ symmetrical bilaterally Abdomen: soft; non-distended; no tenderness; bowel sounds present; no guarding or rebound tenderness Back: no CVA tenderness Extremities: no edema; symmetrical with no gross deformities Skin: warm and dry Neurologic: Ambulates from chair to exam table without difficulty Psychological: alert and cooperative; normal mood and affect  Labs Reviewed  POCT URINALYSIS DIP (MANUAL ENTRY) - Abnormal; Notable for the following components:       Result Value   Clarity, UA cloudy (*)    Blood, UA small (*)    Protein Ur, POC =100 (*)    Nitrite, UA Positive (*)    Leukocytes, UA Large (3+) (*)    All other components within normal limits  URINE CULTURE    ASSESSMENT & PLAN:  1. Lower urinary tract infection, acute     Meds ordered this encounter  Medications  . ciprofloxacin (CIPRO) 250 MG tablet    Sig: Take 1 tablet (250 mg total) by mouth 2 (two)  times daily for 3 days.    Dispense:  6 tablet    Refill:  0   Discharge instruction Urine culture sent.  We will call you with the results.   Push fluids and get plenty of rest.   Take antibiotic as directed and to completion Follow up with PCP if symptoms persists Return here or go to ER if you have any new or worsening symptoms such as fever, worsening abdominal pain, nausea/vomiting, flank pain, etc...  Outlined signs and symptoms indicating need for more acute intervention. Patient verbalized understanding. After Visit Summary given.       Emerson Monte, [place] 06/05/19 1121

## 2019-06-05 NOTE — ED Triage Notes (Signed)
Pt brought in by [facility] states that pts urine is dark brown and odorous

## 2019-06-05 NOTE — Discharge Instructions (Addendum)
Urine culture sent.  We will call you with the results.   Push fluids and get plenty of rest.   Take antibiotic as directed and to completion Follow up with PCP if symptoms persists Return here or go to ER if you have any new or worsening symptoms such as fever, worsening abdominal pain, nausea/vomiting, flank pain, etc... 

## 2019-06-08 LAB — URINE CULTURE: Culture: >=100000 — AB

## 2019-07-09 ENCOUNTER — Encounter: Payer: Self-pay | Admitting: Family Medicine

## 2019-07-09 ENCOUNTER — Other Ambulatory Visit: Payer: Self-pay

## 2019-07-09 ENCOUNTER — Ambulatory Visit (INDEPENDENT_AMBULATORY_CARE_PROVIDER_SITE_OTHER): Payer: Medicare Other | Admitting: Family Medicine

## 2019-07-09 VITALS — BP 132/78 | HR 73 | Temp 97.3°F | Ht 71.0 in

## 2019-07-09 DIAGNOSIS — I10 Essential (primary) hypertension: Secondary | ICD-10-CM | POA: Diagnosis not present

## 2019-07-09 DIAGNOSIS — E1122 Type 2 diabetes mellitus with diabetic chronic kidney disease: Secondary | ICD-10-CM | POA: Diagnosis not present

## 2019-07-09 DIAGNOSIS — N183 Chronic kidney disease, stage 3 unspecified: Secondary | ICD-10-CM

## 2019-07-09 DIAGNOSIS — F0150 Vascular dementia without behavioral disturbance: Secondary | ICD-10-CM | POA: Diagnosis not present

## 2019-07-09 LAB — POCT GLYCOSYLATED HEMOGLOBIN (HGB A1C)
HbA1c POC (<> result, manual entry): 6.3 % (ref 4.0–5.6)
HbA1c, POC (controlled diabetic range): 6.3 % (ref 0.0–7.0)
HbA1c, POC (prediabetic range): 6.3 % (ref 5.7–6.4)
Hemoglobin A1C: 6.3 % — AB (ref 4.0–5.6)

## 2019-07-09 NOTE — Patient Instructions (Addendum)
I appreciate the opportunity to provide you with care for your health and wellness. Today we discussed: hand injury and overall care  Follow up: 3 months A1c check in office   No labs or referrals today  Please call with medications so we can see what might need refilling   Please continue to practice social distancing to keep you, your family, and our community safe.  If you must go out, please wear a mask and practice good handwashing.  It was a pleasure to see you and I look forward to continuing to work together on your health and well-being. Please do not hesitate to call the office if you need care or have questions about your care.  Have a wonderful day and week. With Gratitude, Cherly Beach, DNP, AGNP-BC

## 2019-07-09 NOTE — Progress Notes (Signed)
Subjective:  Patient ID: Mike Briggs, male    DOB: Sep 19, 1928  Age: 86 y.o. MRN: [id number]  CC:  Chief Complaint  Patient presents with   Follow-up    1.5 month follow up hand is better caregiver concerned as he doesnt have as many pills in his pill pack as usual      HPI  HPI  Mike Briggs is an 86 year old male patient of mine who was recently newly established in the last 2 months.  Presents today for follow-up regarding his hand has he had had a fracture found at his first visit with me.  His caregiver presents with him he reports that she thinks his medications have changed.  She is unsure which medications have changed.  She just knows that the number of pills are not the same as they were.  We never received any notification of refills this might possibly be the cause of why some of the medications have stopped.  Advised for her to call back up here with the medications that he is on currently in the bubble pack and we will look and compared to what he was previously audible he first saw him.  Today he reports that he is sleeping okay however he has had some changes in his bowel regimen.  She reports that she sees him usually in the morning after having stool all over him.  She reports that he usually can go to the bathroom on his own she is unsure why he is not doing that anymore.  She reports that he does not have any problems eating or drinking however he is not drinking as much fluid that is good.  And is not eating his vegetables like he used to.  She reports has not had any falls that she is aware of when she is around.  He has not complained of any pain.  His memory is about the same as it was.  The swelling in his hands has improved.  He is not having any pain today in the office.  And denies having any rash or skin issues.  Reports that she thinks he might have a urinary tract infection starting again.  He is to be seen a urologist secondary to scrotal swelling.  I have  advised for her to speak with him about this issue as he could not void today in the office.  Denies having any chest pain, shortness of breath, fluttering sensations of the heart, leg swelling, cough, fever, chills or any other signs or symptoms of uncontrolled blood pressure, or heart concern.  Today patient denies signs and symptoms of COVID 19 infection including fever, chills, cough, shortness of breath, and headache. Past Medical, Surgical, Social History, Allergies, and Medications have been Reviewed.   Past Medical History:  Diagnosis Date   Acute cystitis 12/17/2018   Acute encephalopathy 11/14/2018   ANKLE, ARTHRITIS, DEGEN./OSTEO 12/16/2008   Qualifier: Diagnosis of  By: Aline Brochure MD, Dorothyann Peng     Arthritis    Cataract    Chronic kidney disease    COVID-19    COVID-19 virus detected 11/03/2018   COVID-19 virus infection 11/03/2018   Dehydration 11/02/2018   Depression    Diabetes mellitus    Gram-positive bacteremia 11/03/2018   Hyperlipidemia    Hypertension    Hypomagnesemia 03/24/2019   Klebsiella UTI  03/25/2019   Pain in finger of right hand 05/27/2019   Personal history of gout 05/22/2016   Rhabdomyolysis 11/02/2018  Stroke [facility])    Swelling of both hands 05/27/2019   Syncope 02/25/2018   TIA (transient ischemic attack) 11/14/2018   UTI (urinary tract infection) 02/26/2018   Vitamin D deficiency 05/31/2016    Current Meds  Medication Sig   acetaminophen (TYLENOL) 325 MG tablet Take 2 tablets (650 mg total) by mouth every 6 (six) hours as needed for mild pain (or Fever >/= 101).   amLODipine (NORVASC) 10 MG tablet Take 1 tablet (10 mg total) by mouth daily.   aspirin 81 MG chewable tablet Chew 1 tablet (81 mg total) by mouth daily with breakfast.   atorvastatin (LIPITOR) 10 MG tablet Take 1 tablet (10 mg total) by mouth every evening.   LANTUS SOLOSTAR 100 UNIT/ML Solostar Pen Inject 20 Units into the skin daily.   lisinopril (ZESTRIL) 40  MG tablet Take 1 tablet (40 mg total) by mouth daily.   meloxicam (MOBIC) 7.5 MG tablet Take 1 tablet (7.5 mg total) by mouth daily.   memantine (NAMENDA) 10 MG tablet Take 10 mg by mouth 2 (two) times daily.    metFORMIN (GLUCOPHAGE) 500 MG tablet Take 1 tablet (500 mg total) by mouth 2 (two) times daily with a meal.   metoprolol succinate (TOPROL-XL) 25 MG 24 hr tablet Take 1 tablet (25 mg total) by mouth daily.   tamsulosin (FLOMAX) 0.4 MG CAPS capsule Take 1 capsule (0.4 mg total) by mouth daily after breakfast.   vitamin C (VITAMIN C) 500 MG tablet Take 1 tablet (500 mg total) by mouth daily.   zinc sulfate 220 (50 Zn) MG capsule Take 1 capsule (220 mg total) by mouth daily.    ROS:  Review of Systems  Constitutional:       Not eating like he used to  HENT: Negative.   Eyes: Negative.   Respiratory: Negative.   Cardiovascular: Negative.   Gastrointestinal: Negative.        Change in bathroom habit  Genitourinary:       Dark urine  Musculoskeletal: Negative.   Skin: Negative.   Neurological: Negative.   Endo/Heme/Allergies: Negative.   Psychiatric/Behavioral: Negative.   All other systems reviewed and are negative.    Objective:   Today's Vitals: BP 132/78 (BP Location: Right Arm, Patient Position: Sitting, Cuff Size: Normal)    Pulse 73    Temp (!) 97.3 F (36.3 C) (Temporal)    Ht 5\' 11"  (1.803 m)    SpO2 95%    BMI 30.60 kg/m  Vitals with BMI 07/09/2019 06/05/2019 05/29/2019  Height 5\' 11"  - (No Data)  Weight (No Data) - (No Data)  BMI - - -  Systolic 454 098 119  Diastolic 78 86 56  Pulse 73 75 73     Physical Exam Vitals and nursing note reviewed.  Constitutional:      Appearance: Normal appearance. He is well-developed.  HENT:     Head: Normocephalic and atraumatic.     Right Ear: External ear normal.     Left Ear: External ear normal.     Mouth/Throat:     Comments: Mask in place Eyes:     General:        Right eye: No discharge.        Left  eye: No discharge.     Conjunctiva/sclera: Conjunctivae normal.  Cardiovascular:     Rate and Rhythm: Normal rate and regular rhythm.     Pulses: Normal pulses.     Heart sounds: Normal heart sounds.  Pulmonary:  Effort: Pulmonary effort is normal.     Breath sounds: Normal breath sounds. No decreased breath sounds.  Abdominal:     Tenderness: There is no right CVA tenderness or left CVA tenderness.  Musculoskeletal:        General: Normal range of motion.     Cervical back: Normal range of motion and neck supple.  Skin:    General: Skin is warm.  Neurological:     General: No focal deficit present.     Mental Status: He is alert and oriented to person, place, and time.  Psychiatric:        Attention and Perception: Attention normal.        Mood and Affect: Mood normal.        Speech: Speech normal.        Behavior: Behavior normal. Behavior is cooperative.        Thought Content: Thought content normal.        Cognition and Memory: Cognition normal.        Judgment: Judgment normal.     Assessment   1. Vascular dementia without behavioral disturbance ([place])   2. Type 2 diabetes mellitus with stage 3 chronic kidney disease, without long-term current use of insulin, unspecified whether stage 3a or 3b CKD ([place])   3. Essential hypertension     Tests ordered Orders Placed This Encounter  Procedures   POCT glycosylated hemoglobin (Hb A1C)     Plan: Please see assessment and plan per problem list above.   No orders of the defined types were placed in this encounter.   Patient to follow-up in 3 months  Perlie Mayo, NP

## 2019-07-09 NOTE — Assessment & Plan Note (Signed)
Is on Namenda however he is starting to show signs of bathroom changes and decreased appetite.  Close follow-up possible need for placement in near future if he continues to toilet himself regularly and there is no one able to be with him in the evenings.

## 2019-07-09 NOTE — Assessment & Plan Note (Signed)
Controlled continue current medications.

## 2019-07-09 NOTE — Assessment & Plan Note (Signed)
A1c improved, might need to decrease Lantus to 15 to 18 units. Encouraged a good diet however he has had some eating changes so I have encouraged her to let him eat what he is willing to eat and just try to make sure that he has healthy food choices

## 2019-07-23 NOTE — Progress Notes (Signed)
Subjective: 1. Incomplete bladder emptying   2. Bilateral hydrocele   3. BPH with urinary obstruction   4. Weak urinary stream   5. Chronic cystitis      [name] is an 86 yo male who is sent in consultation by Cherly Beach NP for bilateral hydroceles.   He had a scrotal US in 2018 that confirmed hydroceles.  He has also had recurrent UTI's with cloudy white urine.  He has some dysuria.  He has some urgency.  He has minimal nocturia but can have enuresis.  He has a weak stream and a sensation of incomplete emptying.  He has been constipation and started a stool softener.  He has had no hematuria.  He has no recollection of any GU surgery.  He has had no stones.   He has been hospitalized for UTI's with the most recent trip to the ER on 06/05/19.  The culture grew citrobacter. He had klebsiella on 03/23/19, 12/17/18, 02/25/18 and 10/08/17.  When the urine starts he can't stop it.  He does have some sputtering of the stream.   He had a CT in 2014 and the bladder was noted to be distented.   ROS:  ROS  No Known Allergies  Past Medical History:  Diagnosis Date  . Acute cystitis 12/17/2018  . Acute encephalopathy 11/14/2018  . ANKLE, ARTHRITIS, DEGEN./OSTEO 12/16/2008   Qualifier: Diagnosis of  By: Aline Brochure MD, Dorothyann Peng    . Arthritis   . Cataract   . Chronic kidney disease   . COVID-19   . COVID-19 virus detected 11/03/2018  . COVID-19 virus infection 11/03/2018  . Dehydration 11/02/2018  . Depression   . Diabetes mellitus   . Gram-positive bacteremia 11/03/2018  . Hyperlipidemia   . Hypertension   . Hypomagnesemia 03/24/2019  . Klebsiella UTI  03/25/2019  . Pain in finger of right hand 05/27/2019  . Personal history of gout 05/22/2016  . Rhabdomyolysis 11/02/2018  . Stroke ([place])   . Swelling of both hands 05/27/2019  . Syncope 02/25/2018  . TIA (transient ischemic attack) 11/14/2018  . UTI (urinary tract infection) 02/26/2018  . Vitamin D deficiency 05/31/2016    Past Surgical History:   Procedure Laterality Date  . BACK SURGERY     cervical and lumbar  . CATARACT EXTRACTION    . CHOLECYSTECTOMY    . FOOT SURGERY     5th toe   . SPINE SURGERY     lumbar and cervical fusions    Social History   Socioeconomic History  . Marital status: Divorced    Spouse name: Not on file  . Number of children: 3  . Years of education: 10  . Highest education level: Not on file  Occupational History  . Occupation: Retired.  Tobacco Use  . Smoking status: Former Smoker    Quit date: 08/07/1967    Years since quitting: 51.9  . Smokeless tobacco: Never Used  Vaping Use  . Vaping Use: Never used  Substance and Sexual Activity  . Alcohol use: No  . Drug use: No  . Sexual activity: Not Currently  Other Topics Concern  . Not on file  Social History Narrative   Wheelchair bound.Has not walked in many years, at least five years.  Lives alone. Has 2 aides that come in to help him.   Lives alone.    Retired.   Eats all food groups.    Has 3 children but does not have contact with them.   Divorced.  Social Determinants of Health   Financial Resource Strain:   . Difficulty of Paying Living Expenses:   Food Insecurity:   . Worried About Charity fundraiser in the Last Year:   . Arboriculturist in the Last Year:   Transportation Needs: No Transportation Needs  . Lack of Transportation (Medical): No  . Lack of Transportation (Non-Medical): No  Physical Activity: Inactive  . Days of Exercise per Week: 0 days  . Minutes of Exercise per Session: 0 min  Stress: No Stress Concern Present  . Feeling of Stress : Not at all  Social Connections: Socially Isolated  . Frequency of Communication with Friends and Family: Once a week  . Frequency of Social Gatherings with Friends and Family: Once a week  . Attends Religious Services: More than 4 times per year  . Active Member of Clubs or Organizations: No  . Attends Archivist Meetings: Never  . Marital Status: Divorced   Human resources officer Violence:   . Fear of Current or Ex-Partner:   . Emotionally Abused:   Marland Kitchen Physically Abused:   . Sexually Abused:     Family History  Family history unknown: Yes    Anti-infectives: Anti-infectives (From admission, onward)   None      Current Outpatient Medications  Medication Sig Dispense Refill  . acetaminophen (TYLENOL) 325 MG tablet Take 2 tablets (650 mg total) by mouth every 6 (six) hours as needed for mild pain (or Fever >/= 101). 12 tablet 0  . amLODipine (NORVASC) 10 MG tablet Take 1 tablet (10 mg total) by mouth daily. 30 tablet 2  . aspirin 81 MG chewable tablet Chew 1 tablet (81 mg total) by mouth daily with breakfast. 30 tablet 3  . atorvastatin (LIPITOR) 10 MG tablet Take 1 tablet (10 mg total) by mouth every evening. 30 tablet 5  . LANTUS SOLOSTAR 100 UNIT/ML Solostar Pen Inject 20 Units into the skin daily. 15 mL 3  . lisinopril (ZESTRIL) 40 MG tablet Take 1 tablet (40 mg total) by mouth daily. 30 tablet 3  . meloxicam (MOBIC) 7.5 MG tablet Take 1 tablet (7.5 mg total) by mouth daily. 30 tablet 5  . metoprolol succinate (TOPROL-XL) 25 MG 24 hr tablet Take 1 tablet (25 mg total) by mouth daily. 30 tablet 3  . tamsulosin (FLOMAX) 0.4 MG CAPS capsule Take 1 capsule (0.4 mg total) by mouth daily after breakfast. 30 capsule 3  . vitamin C (VITAMIN C) 500 MG tablet Take 1 tablet (500 mg total) by mouth daily.    Marland Kitchen zinc sulfate 220 (50 Zn) MG capsule Take 1 capsule (220 mg total) by mouth daily.    . finasteride (PROSCAR) 5 MG tablet Take 1 tablet (5 mg total) by mouth daily. 90 tablet 3  . metFORMIN (GLUCOPHAGE) 500 MG tablet Take 1 tablet (500 mg total) by mouth 2 (two) times daily with a meal. (Patient not taking: Reported on 07/24/2019) 60 tablet 5   No current facility-administered medications for this visit.     Objective: Vital signs in last 24 hours: BP 136/76   Pulse 60   Temp (!) 96.8 F (36 C)   Ht 5\' 11"  (1.803 m)   Wt 225 lb (102.1  kg)   BMI 31.38 kg/m   Intake/Output from previous day: No intake/output data recorded. Intake/Output this shift: @IOTHISSHIFT @   Physical Exam Constitutional:      Appearance: Normal appearance.     Comments: Able to stand with  assistance.   Neurological:     Mental Status: He is alert.     Lab Results:  No results found for this or any previous visit (from the past 24 hour(s)).  BMET No results for input(s): NA, K, CL, CO2, GLUCOSE, BUN, CREATININE, CALCIUM in the last 72 hours. PT/INR No results for input(s): LABPROT, INR in the last 72 hours. ABG No results for input(s): PHART, HCO3 in the last 72 hours.  Invalid input(s): PCO2, PO2  Studies/Results: No results found.   Assessment/Plan: BPH with BOO and incomplete emptying.  I would continue current care but also add finasteride to try to maximize his voiding function.  Chronic cystitis.  His urine is very purulent today.  Urine culture sent.  I will consider treatment and suppressive therapy.  Bilateral hydroceles.  They are soft and not large.  No treatment indicated.    Meds ordered this encounter  Medications  . finasteride (PROSCAR) 5 MG tablet    Sig: Take 1 tablet (5 mg total) by mouth daily.    Dispense:  90 tablet    Refill:  3     Orders Placed This Encounter  Procedures  . Urine Culture    Standing Status:   Future    Standing Expiration Date:   08/23/2019  . Urinalysis, Routine w reflex microscopic    Standing Status:   Future    Standing Expiration Date:   08/23/2019  . Bladder scan  . POCT urinalysis dipstick     Return in about 6 weeks (around 09/04/2019) for for PVR and UA. .    CC: Cherly Beach NP    Irine Seal 07/24/2019 [phone]

## 2019-07-24 ENCOUNTER — Other Ambulatory Visit: Payer: Self-pay

## 2019-07-24 ENCOUNTER — Other Ambulatory Visit (HOSPITAL_COMMUNITY)
Admission: RE | Admit: 2019-07-24 | Discharge: 2019-07-24 | Disposition: A | Discharge status: Home / Self Care | Payer: Medicare Other | Source: Other Acute Inpatient Hospital | Attending: Urology | Admitting: Urology

## 2019-07-24 ENCOUNTER — Ambulatory Visit (INDEPENDENT_AMBULATORY_CARE_PROVIDER_SITE_OTHER): Payer: Medicare Other | Admitting: Urology

## 2019-07-24 ENCOUNTER — Encounter: Payer: Self-pay | Admitting: Urology

## 2019-07-24 VITALS — BP 136/76 | HR 60 | Temp 96.8°F | Ht 71.0 in | Wt 225.0 lb

## 2019-07-24 DIAGNOSIS — R3912 Poor urinary stream: Secondary | ICD-10-CM

## 2019-07-24 DIAGNOSIS — N3020 Other chronic cystitis without hematuria: Secondary | ICD-10-CM

## 2019-07-24 DIAGNOSIS — N433 Hydrocele, unspecified: Secondary | ICD-10-CM

## 2019-07-24 DIAGNOSIS — N401 Enlarged prostate with lower urinary tract symptoms: Secondary | ICD-10-CM

## 2019-07-24 DIAGNOSIS — R339 Retention of urine, unspecified: Secondary | ICD-10-CM | POA: Diagnosis not present

## 2019-07-24 DIAGNOSIS — N138 Other obstructive and reflux uropathy: Secondary | ICD-10-CM

## 2019-07-24 LAB — POCT URINALYSIS DIPSTICK
Bilirubin, UA: NEGATIVE
Glucose, UA: NEGATIVE
Ketones, UA: NEGATIVE
Nitrite, UA: NEGATIVE
Protein, UA: POSITIVE — AB
Spec Grav, UA: 1.02 (ref 1.010–1.025)
Urobilinogen, UA: 0.2 E.U./dL
pH, UA: 6 (ref 5.0–8.0)

## 2019-07-24 LAB — URINALYSIS, ROUTINE W REFLEX MICROSCOPIC
Bilirubin Urine: NEGATIVE
Glucose, UA: NEGATIVE mg/dL
Ketones, ur: NEGATIVE mg/dL
Nitrite: NEGATIVE
Protein, ur: 100 mg/dL — AB
Specific Gravity, Urine: 1.016 (ref 1.005–1.030)
WBC, UA: >50 WBC/hpf — ABNORMAL HIGH (ref 0–5)
pH: 5 (ref 5.0–8.0)

## 2019-07-24 LAB — BLADDER SCAN: Scan Result: 184.3

## 2019-07-24 MED ORDER — FINASTERIDE 5 MG PO TABS
5.0000 mg | ORAL_TABLET | Freq: Every day | ORAL | 3 refills | Status: DC
Start: 2019-07-24 — End: 2019-09-11

## 2019-07-24 NOTE — Progress Notes (Signed)
Urological Symptom Review  Patient is experiencing the following symptoms: Frequent urination Hard to postpone urination Burning/pain with urination Get up at night to urinate   Review of Systems  Gastrointestinal (upper)  : Negative for upper GI symptoms  Gastrointestinal (lower) : Negative for lower GI symptoms  Constitutional : Negative for symptoms  Skin: Negative for skin symptoms  Eyes: Negative for eye symptoms  Ear/Nose/Throat : Negative for Ear/Nose/Throat symptoms  Hematologic/Lymphatic: Negative for Hematologic/Lymphatic symptoms  Cardiovascular : Negative for cardiovascular symptoms  Respiratory : Negative for respiratory symptoms  Endocrine: Negative for endocrine symptoms  Musculoskeletal: Negative for musculoskeletal symptoms  Neurological: Negative for neurological symptoms  Psychologic: Negative for psychiatric symptoms   Pt is prepped for and in and out catherization. Patient was cleaned and prepped in a sterle fashion with betadine. A 14 fr catheter foley was inserted. Urine return was note 230 ml.  Performed by [facility]  Urine sent for culture

## 2019-07-26 LAB — URINE CULTURE: Culture: >=100000 — AB

## 2019-07-27 ENCOUNTER — Other Ambulatory Visit: Payer: Self-pay

## 2019-07-27 ENCOUNTER — Telehealth: Payer: Self-pay

## 2019-07-27 DIAGNOSIS — N390 Urinary tract infection, site not specified: Secondary | ICD-10-CM

## 2019-07-27 MED ORDER — CEPHALEXIN 250 MG PO CAPS: 250.0000 mg | ORAL_CAPSULE | Freq: Every day | ORAL | 1 refills | Status: DC

## 2019-07-27 MED ORDER — CEPHALEXIN 250 MG PO CAPS: 250.0000 mg | ORAL_CAPSULE | Freq: Three times a day (TID) | ORAL | 0 refills | Status: AC

## 2019-07-27 NOTE — Telephone Encounter (Signed)
-----   Message from Irine Seal, MD sent at 07/27/2019  4:25 PM EDT ----- He needs keflex 250mg  po tid #30 and the keflex 250mg  po qhs #90 with a refill.   He should f/u in 6-8 weeks if he is not otherwise scheduled to return. ----- Message ----- From: Dorisann Frames, RN Sent: 07/27/2019  12:21 PM EDT To: Irine Seal, MD  ua

## 2019-07-27 NOTE — Telephone Encounter (Signed)
Pt notified of results. Prescriptions were sent in. Instructions on how to take medication was reviewed with pt and pt voiced understanding.

## 2019-08-06 ENCOUNTER — Telehealth: Payer: Self-pay | Admitting: Family Medicine

## 2019-08-06 ENCOUNTER — Other Ambulatory Visit: Payer: Self-pay | Admitting: Family Medicine

## 2019-08-06 NOTE — Telephone Encounter (Signed)
[name] Vertell Limber called stating that Mike Briggs was missing meds from his pill packs, called back to get clarification on the medication that was missing and received no answer and was unable to leave a VM

## 2019-09-01 ENCOUNTER — Other Ambulatory Visit: Payer: Self-pay | Admitting: Family Medicine

## 2019-09-10 NOTE — Progress Notes (Signed)
Subjective: 1. BPH with urinary obstruction   2. Incomplete bladder emptying   3. Chronic cystitis      Mike Briggs returns today in f/u he has a history of BPH with bOO and has been on tamsulosin.  Finasteride was added at his last visit but he has run out.  He was found to have Klebsiella at his last visit and was given keflex for therapy and now suppression with 250mg  nightly.   He continues to have cloudy urine and dysuria.  He has had no hematuria.  His PVR today is 18ml and his IPSS is 29 but he feels like he is voiding ok.  A subsequent cath PVR was 349ml and the urine was cloudy.   GU Hx: Mike Briggs is an 86 yo male who is sent in consultation by Cherly Beach NP for bilateral hydroceles.   He had a scrotal US in 2018 that confirmed hydroceles.  He has also had recurrent UTI's with cloudy white urine.  He has some dysuria.  He has some urgency.  He has minimal nocturia but can have enuresis.  He has a weak stream and a sensation of incomplete emptying.  He has been constipation and started a stool softener.  He has had no hematuria.  He has no recollection of any GU surgery.  He has had no stones.   He has been hospitalized for UTI's with the most recent trip to the ER on 06/05/19.  The culture grew citrobacter. He had klebsiella on 03/23/19, 12/17/18, 02/25/18 and 10/08/17.  When the urine starts he can't stop it.  He does have some sputtering of the stream.   He had a CT in 2014 and the bladder was noted to be distented.   ROS:  ROS  No Known Allergies  Past Medical History:  Diagnosis Date  . Acute cystitis 12/17/2018  . Acute encephalopathy 11/14/2018  . ANKLE, ARTHRITIS, DEGEN./OSTEO 12/16/2008   Qualifier: Diagnosis of  By: Aline Brochure MD, Dorothyann Peng    . Arthritis   . Cataract   . Chronic kidney disease   . COVID-19   . COVID-19 virus detected 11/03/2018  . COVID-19 virus infection 11/03/2018  . Dehydration 11/02/2018  . Depression   . Diabetes mellitus   . Gram-positive bacteremia  11/03/2018  . Hyperlipidemia   . Hypertension   . Hypomagnesemia 03/24/2019  . Klebsiella UTI  03/25/2019  . Pain in finger of right hand 05/27/2019  . Personal history of gout 05/22/2016  . Rhabdomyolysis 11/02/2018  . Stroke ([place])   . Swelling of both hands 05/27/2019  . Syncope 02/25/2018  . TIA (transient ischemic attack) 11/14/2018  . UTI (urinary tract infection) 02/26/2018  . Vitamin D deficiency 05/31/2016    Past Surgical History:  Procedure Laterality Date  . BACK SURGERY     cervical and lumbar  . CATARACT EXTRACTION    . CHOLECYSTECTOMY    . FOOT SURGERY     5th toe   . SPINE SURGERY     lumbar and cervical fusions    Social History   Socioeconomic History  . Marital status: Divorced    Spouse name: Not on file  . Number of children: 3  . Years of education: 10  . Highest education level: Not on file  Occupational History  . Occupation: Retired.  Tobacco Use  . Smoking status: Former Smoker    Quit date: 08/07/1967    Years since quitting: 52.1  . Smokeless tobacco: Never Used  Vaping Use  .  Vaping Use: Never used  Substance and Sexual Activity  . Alcohol use: No  . Drug use: No  . Sexual activity: Not Currently  Other Topics Concern  . Not on file  Social History Narrative   Wheelchair bound.Has not walked in many years, at least five years.  Lives alone. Has 2 aides that come in to help him.   Lives alone.    Retired.   Eats all food groups.    Has 3 children but does not have contact with them.   Divorced.   Social Determinants of Health   Financial Resource Strain:   . Difficulty of Paying Living Expenses:   Food Insecurity:   . Worried About Charity fundraiser in the Last Year:   . Arboriculturist in the Last Year:   Transportation Needs: No Transportation Needs  . Lack of Transportation (Medical): No  . Lack of Transportation (Non-Medical): No  Physical Activity: Inactive  . Days of Exercise per Week: 0 days  . Minutes of Exercise per  Session: 0 min  Stress: No Stress Concern Present  . Feeling of Stress : Not at all  Social Connections: Socially Isolated  . Frequency of Communication with Friends and Family: Once a week  . Frequency of Social Gatherings with Friends and Family: Once a week  . Attends Religious Services: More than 4 times per year  . Active Member of Clubs or Organizations: No  . Attends Archivist Meetings: Never  . Marital Status: Divorced  Human resources officer Violence:   . Fear of Current or Ex-Partner:   . Emotionally Abused:   Marland Kitchen Physically Abused:   . Sexually Abused:     Family History  Family history unknown: Yes    Anti-infectives: Anti-infectives (From admission, onward)   None      Current Outpatient Medications  Medication Sig Dispense Refill  . acetaminophen (TYLENOL) 325 MG tablet Take 2 tablets (650 mg total) by mouth every 6 (six) hours as needed for mild pain (or Fever >/= 101). 12 tablet 0  . amLODipine (NORVASC) 10 MG tablet TAKE ONE TABLET BY MOUTH ONCE DAILY. 30 tablet 0  . aspirin 81 MG chewable tablet Chew 1 tablet (81 mg total) by mouth daily with breakfast. 30 tablet 3  . atorvastatin (LIPITOR) 10 MG tablet Take 1 tablet (10 mg total) by mouth every evening. 30 tablet 5  . cephALEXin (KEFLEX) 250 MG capsule Take 1 capsule (250 mg total) by mouth at bedtime. 90 capsule 1  . finasteride (PROSCAR) 5 MG tablet Take 1 tablet (5 mg total) by mouth daily. 90 tablet 3  . LANTUS SOLOSTAR 100 UNIT/ML Solostar Pen Inject 20 Units into the skin daily. 15 mL 3  . lisinopril (ZESTRIL) 40 MG tablet TAKE (1) TABLET BY MOUTH ONCE DAILY. 30 tablet 0  . meloxicam (MOBIC) 7.5 MG tablet Take 1 tablet (7.5 mg total) by mouth daily. 30 tablet 5  . memantine (NAMENDA) 10 MG tablet TAKE (1) TABLET BY MOUTH TWICE DAILY. 60 tablet 0  . metoprolol succinate (TOPROL-XL) 25 MG 24 hr tablet TAKE (1) TABLET BY MOUTH ONCE DAILY. 30 tablet 0  . tamsulosin (FLOMAX) 0.4 MG CAPS capsule Take 1  capsule (0.4 mg total) by mouth daily after breakfast. 30 capsule 3  . vitamin C (VITAMIN C) 500 MG tablet Take 1 tablet (500 mg total) by mouth daily.    Marland Kitchen zinc sulfate 220 (50 Zn) MG capsule Take 1 capsule (220 mg  total) by mouth daily.     No current facility-administered medications for this visit.     Objective: Vital signs in last 24 hours: BP (!) 155/69   Pulse 68   Temp 97.6 F (36.4 C)   Ht 5\' 11"  (1.803 m)   Wt 225 lb (102.1 kg)   BMI 31.38 kg/m   Intake/Output from previous day: No intake/output data recorded. Intake/Output this shift: @IOTHISSHIFT @   Physical Exam Constitutional:      Appearance: Normal appearance.     Comments: Able to stand with assistance.   Neurological:     Mental Status: He is alert.     Lab Results:  No results found for this or any previous visit (from the past 24 hour(s)).  BMET No results for input(s): NA, K, CL, CO2, GLUCOSE, BUN, CREATININE, CALCIUM in the last 72 hours. PT/INR No results for input(s): LABPROT, INR in the last 72 hours. ABG No results for input(s): PHART, HCO3 in the last 72 hours.  Invalid input(s): PCO2, PO2  Studies/Results: No results found.   Assessment/Plan: BPH with BOO and incomplete emptying.  I will refill the finasteride.  Chronic cystitis.  His urine is very purulent today.  Urine culture sent.  I will adjust antibiotics as needed.     Meds ordered this encounter  Medications  . finasteride (PROSCAR) 5 MG tablet    Sig: Take 1 tablet (5 mg total) by mouth daily.    Dispense:  90 tablet    Refill:  3     Orders Placed This Encounter  Procedures  . Urine Culture  . Urinalysis, Routine w reflex microscopic  . Insert,non-indwelling bladder catheter  . BLADDER SCAN AMB NON-IMAGING     Return in about 6 weeks (around 10/23/2019) for for PVR and UA. .    CC: Cherly Beach NP    Irine Seal 09/11/2019 [phone]

## 2019-09-11 ENCOUNTER — Other Ambulatory Visit: Payer: Self-pay

## 2019-09-11 ENCOUNTER — Emergency Department (HOSPITAL_COMMUNITY): Payer: Medicare Other

## 2019-09-11 ENCOUNTER — Ambulatory Visit (INDEPENDENT_AMBULATORY_CARE_PROVIDER_SITE_OTHER): Payer: Medicare Other | Admitting: Urology

## 2019-09-11 ENCOUNTER — Encounter (HOSPITAL_COMMUNITY): Payer: Self-pay | Admitting: *Deleted

## 2019-09-11 ENCOUNTER — Emergency Department (HOSPITAL_COMMUNITY)
Admission: EM | Admit: 2019-09-11 | Discharge: 2019-09-12 | Disposition: A | Discharge status: Home / Self Care | Payer: Medicare Other | Attending: Emergency Medicine | Admitting: Emergency Medicine

## 2019-09-11 VITALS — BP 155/69 | HR 68 | Temp 97.6°F | Ht 71.0 in | Wt 225.0 lb

## 2019-09-11 DIAGNOSIS — R339 Retention of urine, unspecified: Secondary | ICD-10-CM | POA: Diagnosis not present

## 2019-09-11 DIAGNOSIS — R944 Abnormal results of kidney function studies: Secondary | ICD-10-CM | POA: Insufficient documentation

## 2019-09-11 DIAGNOSIS — N401 Enlarged prostate with lower urinary tract symptoms: Secondary | ICD-10-CM | POA: Diagnosis not present

## 2019-09-11 DIAGNOSIS — I129 Hypertensive chronic kidney disease with stage 1 through stage 4 chronic kidney disease, or unspecified chronic kidney disease: Secondary | ICD-10-CM | POA: Diagnosis not present

## 2019-09-11 DIAGNOSIS — Z87891 Personal history of nicotine dependence: Secondary | ICD-10-CM | POA: Diagnosis not present

## 2019-09-11 DIAGNOSIS — N3020 Other chronic cystitis without hematuria: Secondary | ICD-10-CM | POA: Diagnosis not present

## 2019-09-11 DIAGNOSIS — E1122 Type 2 diabetes mellitus with diabetic chronic kidney disease: Secondary | ICD-10-CM | POA: Diagnosis not present

## 2019-09-11 DIAGNOSIS — N183 Chronic kidney disease, stage 3 unspecified: Secondary | ICD-10-CM | POA: Insufficient documentation

## 2019-09-11 DIAGNOSIS — Z7982 Long term (current) use of aspirin: Secondary | ICD-10-CM | POA: Diagnosis not present

## 2019-09-11 DIAGNOSIS — Z79899 Other long term (current) drug therapy: Secondary | ICD-10-CM | POA: Insufficient documentation

## 2019-09-11 DIAGNOSIS — M549 Dorsalgia, unspecified: Secondary | ICD-10-CM | POA: Diagnosis present

## 2019-09-11 DIAGNOSIS — K5900 Constipation, unspecified: Secondary | ICD-10-CM

## 2019-09-11 DIAGNOSIS — N138 Other obstructive and reflux uropathy: Secondary | ICD-10-CM | POA: Diagnosis not present

## 2019-09-11 LAB — CBC WITH DIFFERENTIAL/PLATELET
Abs Immature Granulocytes: 0.08 10*3/uL — ABNORMAL HIGH (ref 0.00–0.07)
Basophils Absolute: 0.1 10*3/uL (ref 0.0–0.1)
Basophils Relative: 1 %
Eosinophils Absolute: 0.2 10*3/uL (ref 0.0–0.5)
Eosinophils Relative: 1 %
HCT: 44.7 % (ref 39.0–52.0)
Hemoglobin: 13.5 g/dL (ref 13.0–17.0)
Immature Granulocytes: 1 %
Lymphocytes Relative: 15 %
Lymphs Abs: 1.8 10*3/uL (ref 0.7–4.0)
MCH: 30.1 pg (ref 26.0–34.0)
MCHC: 30.2 g/dL (ref 30.0–36.0)
MCV: 99.6 fL (ref 80.0–100.0)
Monocytes Absolute: 0.7 10*3/uL (ref 0.1–1.0)
Monocytes Relative: 6 %
Neutro Abs: 9.3 10*3/uL — ABNORMAL HIGH (ref 1.7–7.7)
Neutrophils Relative %: 76 %
Platelets: 203 10*3/uL (ref 150–400)
RBC: 4.49 MIL/uL (ref 4.22–5.81)
RDW: 13.2 % (ref 11.5–15.5)
WBC: 12.2 10*3/uL — ABNORMAL HIGH (ref 4.0–10.5)
nRBC: 0.2 % (ref 0.0–0.2)

## 2019-09-11 LAB — MICROSCOPIC EXAMINATION
Epithelial Cells (non renal): NONE SEEN /hpf (ref 0–10)
Renal Epithel, UA: NONE SEEN /hpf
WBC, UA: >30 /hpf — AB (ref 0–5)

## 2019-09-11 LAB — URINALYSIS, ROUTINE W REFLEX MICROSCOPIC
Bilirubin, UA: NEGATIVE
Glucose, UA: NEGATIVE
Ketones, UA: NEGATIVE
Nitrite, UA: NEGATIVE
Specific Gravity, UA: 1.02 (ref 1.005–1.030)
Urobilinogen, Ur: 0.2 mg/dL (ref 0.2–1.0)
pH, UA: 6 (ref 5.0–7.5)

## 2019-09-11 LAB — BASIC METABOLIC PANEL
Anion gap: 9 (ref 5–15)
BUN: 40 mg/dL — ABNORMAL HIGH (ref 8–23)
CO2: 20 mmol/L — ABNORMAL LOW (ref 22–32)
Calcium: 8.9 mg/dL (ref 8.9–10.3)
Chloride: 110 mmol/L (ref 98–111)
Creatinine, Ser: 1.68 mg/dL — ABNORMAL HIGH (ref 0.61–1.24)
GFR calc Af Amer: 41 mL/min — ABNORMAL LOW (ref >60)
GFR calc non Af Amer: 35 mL/min — ABNORMAL LOW (ref >60)
Glucose, Bld: 137 mg/dL — ABNORMAL HIGH (ref 70–99)
Potassium: 4.9 mmol/L (ref 3.5–5.1)
Sodium: 139 mmol/L (ref 135–145)

## 2019-09-11 LAB — BLADDER SCAN AMB NON-IMAGING: Scan Result: 179.3

## 2019-09-11 LAB — POC OCCULT BLOOD, ED: Fecal Occult Bld: NEGATIVE

## 2019-09-11 MED ORDER — MAGNESIUM CITRATE PO SOLN
1.0000 | Freq: Once | ORAL | Status: AC
  Administered 2019-09-12: 1 via ORAL
  Filled 2019-09-11: qty 296

## 2019-09-11 MED ORDER — SODIUM CHLORIDE 0.9 % IV BOLUS
1000.0000 mL | Freq: Once | INTRAVENOUS | Status: AC
  Administered 2019-09-11: 1000 mL via INTRAVENOUS

## 2019-09-11 MED ORDER — FINASTERIDE 5 MG PO TABS: 5.0000 mg | ORAL_TABLET | Freq: Every day | ORAL | 3 refills | Status: DC

## 2019-09-11 NOTE — Progress Notes (Signed)
PVR 179.3  Pt unable to void    Urological Symptom Review  Patient is experiencing the following symptoms: Frequent urination Hard to postpone urination Burning/pain with urination Get up at night to urinate Leakage of urine Stream starts and stops Trouble starting stream Have to strain to urinate Urinary tract infection Weak stream   Review of Systems  Gastrointestinal (upper)  : Negative for upper GI symptoms  Gastrointestinal (lower) : Constipation  Constitutional : Negative for symptoms  Skin: Negative for skin symptoms  Eyes: Blurred vision  Ear/Nose/Throat : Negative for Ear/Nose/Throat symptoms  Hematologic/Lymphatic: Negative for Hematologic/Lymphatic symptoms  Cardiovascular : Negative for cardiovascular symptoms  Respiratory : Cough  Endocrine: Negative for endocrine symptoms  Musculoskeletal: Back pain  Neurological: Negative for neurological symptoms  Psychologic: Negative for psychiatric symptoms

## 2019-09-11 NOTE — Progress Notes (Signed)
Pt is prepped for and in and out catherization. Patient was cleaned and prepped in a sterle fashion with betadine. A 14 fr catheter foley was inserted. Urine return was note 300 ml.  Performed by Lurline Hare  Urine sent for UA

## 2019-09-11 NOTE — ED Provider Notes (Signed)
[facility] EMERGENCY DEPARTMENT Provider Note   CSN: [id number] Arrival date & time: 09/11/19  1610     History Chief Complaint  Patient presents with  . Constipation  . Back Pain    Mike Briggs is a 86 y.o. male.  HPI      Mike Briggs is a 86 y.o. male with past medical history of diabetes, hypertension, hyperlipidemia, chronic kidney disease and previous stroke who presents to the Emergency Department complaining of constipation for greater than 1 week.  States that he is tried several therapies at home without relief.  He tried an enema earlier today also without relief.  He states that he feels the urge to defecate, but "it will not come out."  He reports history of recurrent constipation and current symptoms feel similar.  Denies history of hemorrhoids, black or bloody stools.  He also denies abdominal pain, chest pain, shortness of breath, fever, chills, or dysuria.  No new medications.    Past Medical History:  Diagnosis Date  . Acute cystitis 12/17/2018  . Acute encephalopathy 11/14/2018  . ANKLE, ARTHRITIS, DEGEN./OSTEO 12/16/2008   Qualifier: Diagnosis of  By: Aline Brochure MD, Dorothyann Peng    . Arthritis   . Cataract   . Chronic kidney disease   . COVID-19   . COVID-19 virus detected 11/03/2018  . COVID-19 virus infection 11/03/2018  . Dehydration 11/02/2018  . Depression   . Diabetes mellitus   . Gram-positive bacteremia 11/03/2018  . Hyperlipidemia   . Hypertension   . Hypomagnesemia 03/24/2019  . Klebsiella UTI  03/25/2019  . Pain in finger of right hand 05/27/2019  . Personal history of gout 05/22/2016  . Rhabdomyolysis 11/02/2018  . Stroke ([place])   . Swelling of both hands 05/27/2019  . Syncope 02/25/2018  . TIA (transient ischemic attack) 11/14/2018  . UTI (urinary tract infection) 02/26/2018  . Vitamin D deficiency 05/31/2016    Patient Active Problem List   Diagnosis Date Noted  . Hydrocele 05/27/2019  . Mobitz type 1 second degree atrioventricular  block   . Sacral decubitus ulcer, stage III ([place]) 11/11/2018  . Pressure injury of skin 11/08/2018  . Essential hypertension 05/22/2016  . Hemiparesis affecting right side as late effect of cerebrovascular accident (CVA) ([place]) 05/22/2016  . BPH (benign prostatic hyperplasia) 05/22/2016  . Chronic constipation 05/22/2016  . HLD (hyperlipidemia) 05/22/2016  . Pseudophakia 12/06/2015  . Vascular dementia ([place]) 10/23/2012  . Chronic kidney disease, stage III (moderate) ([place]) 10/20/2012  . Type 2 diabetes mellitus with diabetic chronic kidney disease ([place]) 12/16/2008    Past Surgical History:  Procedure Laterality Date  . BACK SURGERY     cervical and lumbar  . CATARACT EXTRACTION    . CHOLECYSTECTOMY    . FOOT SURGERY     5th toe   . SPINE SURGERY     lumbar and cervical fusions       Family History  Family history unknown: Yes    Social History   Tobacco Use  . Smoking status: Former Smoker    Quit date: 08/07/1967    Years since quitting: 52.1  . Smokeless tobacco: Never Used  Vaping Use  . Vaping Use: Never used  Substance Use Topics  . Alcohol use: No  . Drug use: No    Home Medications Prior to Admission medications   Medication Sig Start Date End Date Taking? Authorizing Provider  acetaminophen (TYLENOL) 325 MG tablet Take 2 tablets (650 mg total) by mouth every 6 (  six) hours as needed for mild pain (or Fever >/= 101). 03/25/19   Emokpae, Courage, MD  amLODipine (NORVASC) 10 MG tablet TAKE ONE TABLET BY MOUTH ONCE DAILY. 09/01/19   Perlie Mayo, NP  aspirin 81 MG chewable tablet Chew 1 tablet (81 mg total) by mouth daily with breakfast. 03/25/19   Roxan Hockey, MD  atorvastatin (LIPITOR) 10 MG tablet Take 1 tablet (10 mg total) by mouth every evening. 03/25/19   Denton Brick, Courage, MD  cephALEXin (KEFLEX) 250 MG capsule Take 1 capsule (250 mg total) by mouth at bedtime. 07/27/19   Irine Seal, MD  finasteride (PROSCAR) 5 MG tablet Take 1 tablet (5 mg total) by  mouth daily. 09/11/19   Irine Seal, MD  LANTUS SOLOSTAR 100 UNIT/ML Solostar Pen Inject 20 Units into the skin daily. 05/27/19   Perlie Mayo, NP  lisinopril (ZESTRIL) 40 MG tablet TAKE (1) TABLET BY MOUTH ONCE DAILY. 09/01/19   Perlie Mayo, NP  meloxicam (MOBIC) 7.5 MG tablet Take 1 tablet (7.5 mg total) by mouth daily. 05/29/19   Carole Civil, MD  memantine (NAMENDA) 10 MG tablet TAKE (1) TABLET BY MOUTH TWICE DAILY. 09/01/19   Perlie Mayo, NP  metoprolol succinate (TOPROL-XL) 25 MG 24 hr tablet TAKE (1) TABLET BY MOUTH ONCE DAILY. 09/01/19   Perlie Mayo, NP  tamsulosin (FLOMAX) 0.4 MG CAPS capsule Take 1 capsule (0.4 mg total) by mouth daily after breakfast. 03/25/19   Roxan Hockey, MD  vitamin C (VITAMIN C) 500 MG tablet Take 1 tablet (500 mg total) by mouth daily. 11/19/18   Eugenie Filler, MD  zinc sulfate 220 (50 Zn) MG capsule Take 1 capsule (220 mg total) by mouth daily. 03/25/19   Roxan Hockey, MD    Allergies    Patient has no known allergies.  Review of Systems   Review of Systems  Constitutional: Negative for appetite change, chills and fever.  Respiratory: Negative for shortness of breath.   Cardiovascular: Negative for chest pain.  Gastrointestinal: Positive for constipation. Negative for abdominal pain, blood in stool, nausea and vomiting.  Genitourinary: Negative for decreased urine volume, difficulty urinating, dysuria and flank pain.  Musculoskeletal: Negative for back pain.  Skin: Negative for color change and rash.  Neurological: Negative for dizziness, weakness and numbness.  Hematological: Negative for adenopathy.    Physical Exam Updated Vital Signs BP 122/68 (BP Location: Right Arm)   Pulse 69   Temp 97.8 F (36.6 C) (Oral)   Resp 16   Ht 5\' 11"  (1.803 m)   SpO2 100%   BMI 31.38 kg/m   Physical Exam Vitals and nursing note reviewed.  Constitutional:      General: He is not in acute distress.    Appearance: Normal appearance.    HENT:     Head: Normocephalic and atraumatic.  Cardiovascular:     Rate and Rhythm: Normal rate and regular rhythm.     Pulses: Normal pulses.  Pulmonary:     Effort: Pulmonary effort is normal.     Breath sounds: Normal breath sounds.  Abdominal:     General: There is no distension.     Palpations: Abdomen is soft.     Tenderness: There is no abdominal tenderness. There is no right CVA tenderness, left CVA tenderness, guarding or rebound.  Genitourinary:    Rectum: Guaiac result negative. No mass, tenderness, anal fissure or external hemorrhoid. Normal anal tone.     Comments: Soft, brown, heme negative  stool on digital rectal exam.  No palpable rectal masses.  No obvious fecal impaction. Musculoskeletal:        General: No tenderness. Normal range of motion.  Skin:    General: Skin is warm.     Findings: No rash.  Neurological:     General: No focal deficit present.     Mental Status: He is alert.     Sensory: No sensory deficit.     Motor: No weakness.     Comments: CN II through XII grossly intact.  Speech clear.  Psychiatric:        Judgment: Judgment normal.     ED Results / Procedures / Treatments   Labs (all labs ordered are listed, but only abnormal results are displayed) Labs Reviewed  BASIC METABOLIC PANEL - Abnormal; Notable for the following components:      Result Value   CO2 20 (*)    Glucose, Bld 137 (*)    BUN 40 (*)    Creatinine, Ser 1.68 (*)    GFR calc non Af Amer 35 (*)    GFR calc Af Amer 41 (*)    All other components within normal limits  CBC WITH DIFFERENTIAL/PLATELET - Abnormal; Notable for the following components:   WBC 12.2 (*)    Neutro Abs 9.3 (*)    Abs Immature Granulocytes 0.08 (*)    All other components within normal limits  POC OCCULT BLOOD, ED    EKG None  Radiology DG Abdomen 1 View  Result Date: 09/11/2019 CLINICAL DATA:  Constipation EXAM: ABDOMEN - 1 VIEW COMPARISON:  None. FINDINGS: Single view radiograph abdomen.  Small to moderate volume stool noted within the colon. Cholecystectomy clips in the right upper quadrant. No organomegaly. No abnormal calcifications within the abdomen and pelvis. L3-L5 posterior lumbar fusion with instrumentation has been performed. No acute bone abnormality. IMPRESSION: Small to moderate volume of stool noted within the colon. Nonobstructive bowel gas pattern. Electronically Signed   By: Fidela Salisbury MD   On: 09/11/2019 21:32    Procedures Procedures (including critical care time)  Medications Ordered in ED Medications - No data to display  ED Course  I have reviewed the triage vital signs and the nursing notes.  Pertinent labs & imaging results that were available during my care of the patient were reviewed by me and considered in my medical decision making (see chart for details).  Clinical Course as of Sep 10 2356  Fri Aug 13, 216  [id number] 86 year old male who is hard of hearing presented ED with constipation.  Abdominal film does not show obstruction pattern.  His benign abdominal exam.  There is no vomiting at home.  Is not clear what he has been trying at home for his constipation, he suggested he try to himself an enema but is uncertain how successful that was.  PA provider did perform a digital rectal exam reported there was no impacted stool on that exam.  It is reasonable to treat the patient with a stronger laxative like magnesium citrate when he returns home.  His labs do show a little bit of a prerenal worsening kidney injury, does not qualify as an AKI at this time.  We will give the patient a liter of IV fluids.  I advised the patient very clearly that he needs to continue drinking water at home (he reports he really only drinks cranberry juice) to help his kidneys and with his dehydration.  He will need a repeat check  of his labs within a week.  Otherwise he is well appearing and can go home.   [MT]    Clinical Course User Index [MT] Trifan, Carola Rhine, MD    MDM Rules/Calculators/A&P                          Patient here for recurrent constipation.  Notes last bowel movement greater than 1 week ago.  He is attempted several therapies at home without relief.  Used an enema today also without relief.  Abdomen is soft and nontender on exam.  I doubt acute surgical abdomen.  Patient well-appearing.  Vital signs reviewed.  Will obtain baseline labs and abdominal imaging.  X-ray shows mild to moderate stool burden, nonobstructive bowel gas pattern.  No definite fecal impaction on digital exam.  Heme-negative brown stool.  Abdomen is soft, no concerning symptoms for acute process.  On review of patient's labs, he appears to have some elevated kidney functions.  This is felt to be related to dehydration.  This would also contribute to his constipation.  We will try IV normal saline and reassess.   pt also seen by Dr. Langston Masker and care plan discussed.    Recheck, patient resting comfortably.  Continues to deny abdominal pain.  Abdomen is soft and nontender.  I feel that he is discharged home, he will need close follow-up with PCP regarding his kidney functions.  This will need to be retested in 1 week.  Dispensed mag citrate to go home with for constipation.  Attempted to contact pt's niece, no answer.    Final Clinical Impression(s) / ED Diagnoses Final diagnoses:  Constipation, unspecified constipation type  Kidney function test abnormal    Rx / DC Orders ED Discharge Orders    None       Kem Parkinson, PA-C 09/12/19 0005    Wyvonnia Dusky, MD 09/12/19 [phone]

## 2019-09-11 NOTE — ED Triage Notes (Signed)
C/o chronic constipation and low back pain

## 2019-09-12 DIAGNOSIS — K5900 Constipation, unspecified: Secondary | ICD-10-CM | POA: Diagnosis not present

## 2019-09-12 NOTE — Discharge Instructions (Addendum)
Your x-ray this evening shows that you are mildly constipated.  You were given a bottle of medication to take when you get home that will relieve the constipation.  Drink one half of the bottle, wait 30 minutes to 1 hour if no relief then drink the other half.  Also, you are likely dehydrated.  You were given IV fluids here to help with this.  Your kidney function tests this evening were elevated.  This could be related to your dehydration.  Your primary care provider will need to recheck your kidney function tests in 1 week.  Call on Monday to arrange a follow-up appointment.

## 2019-09-14 ENCOUNTER — Other Ambulatory Visit: Payer: Self-pay

## 2019-09-14 ENCOUNTER — Telehealth: Payer: Self-pay

## 2019-09-14 DIAGNOSIS — N390 Urinary tract infection, site not specified: Secondary | ICD-10-CM

## 2019-09-14 LAB — URINE CULTURE

## 2019-09-14 MED ORDER — CEPHALEXIN 250 MG PO CAPS: 250.0000 mg | ORAL_CAPSULE | Freq: Three times a day (TID) | ORAL | 0 refills | Status: AC

## 2019-09-14 MED ORDER — CEPHALEXIN 250 MG PO CAPS: 250.0000 mg | ORAL_CAPSULE | Freq: Three times a day (TID) | ORAL | 0 refills | Status: DC

## 2019-09-14 MED ORDER — CEPHALEXIN 500 MG PO CAPS: 500.0000 mg | ORAL_CAPSULE | Freq: Every evening | ORAL | 5 refills | Status: DC

## 2019-09-14 NOTE — Telephone Encounter (Signed)
Caregiver made aware of urine culture and new prescriptions.

## 2019-09-14 NOTE — Telephone Encounter (Signed)
-----   Message from Irine Seal, MD sent at 09/14/2019  8:16 AM EDT ----- He is growing klebsiella which is sens to the Cephalexin which he should be on nightly.  He needs cephalexin 250mg  po tid #30  and then cephalexin 500mg  po qhs #30 with 5 refills for suppression.   F/U as planned.

## 2019-09-16 ENCOUNTER — Emergency Department (HOSPITAL_COMMUNITY): Payer: Medicare Other

## 2019-09-16 ENCOUNTER — Emergency Department (HOSPITAL_COMMUNITY)
Admission: EM | Admit: 2019-09-16 | Discharge: 2019-09-17 | Disposition: A | Discharge status: Home / Self Care | Payer: Medicare Other | Attending: Emergency Medicine | Admitting: Emergency Medicine

## 2019-09-16 ENCOUNTER — Encounter (HOSPITAL_COMMUNITY): Payer: Self-pay | Admitting: Emergency Medicine

## 2019-09-16 ENCOUNTER — Other Ambulatory Visit: Payer: Self-pay | Admitting: Family Medicine

## 2019-09-16 ENCOUNTER — Other Ambulatory Visit: Payer: Self-pay

## 2019-09-16 ENCOUNTER — Telehealth: Payer: Self-pay

## 2019-09-16 DIAGNOSIS — G319 Degenerative disease of nervous system, unspecified: Secondary | ICD-10-CM | POA: Diagnosis not present

## 2019-09-16 DIAGNOSIS — I129 Hypertensive chronic kidney disease with stage 1 through stage 4 chronic kidney disease, or unspecified chronic kidney disease: Secondary | ICD-10-CM | POA: Diagnosis not present

## 2019-09-16 DIAGNOSIS — Z7984 Long term (current) use of oral hypoglycemic drugs: Secondary | ICD-10-CM | POA: Diagnosis not present

## 2019-09-16 DIAGNOSIS — G9389 Other specified disorders of brain: Secondary | ICD-10-CM | POA: Diagnosis not present

## 2019-09-16 DIAGNOSIS — Z7982 Long term (current) use of aspirin: Secondary | ICD-10-CM | POA: Insufficient documentation

## 2019-09-16 DIAGNOSIS — N3289 Other specified disorders of bladder: Secondary | ICD-10-CM | POA: Diagnosis not present

## 2019-09-16 DIAGNOSIS — S91114A Laceration without foreign body of right lesser toe(s) without damage to nail, initial encounter: Secondary | ICD-10-CM | POA: Diagnosis not present

## 2019-09-16 DIAGNOSIS — K5900 Constipation, unspecified: Secondary | ICD-10-CM | POA: Insufficient documentation

## 2019-09-16 DIAGNOSIS — N1832 Chronic kidney disease, stage 3b: Secondary | ICD-10-CM | POA: Insufficient documentation

## 2019-09-16 DIAGNOSIS — W19XXXA Unspecified fall, initial encounter: Secondary | ICD-10-CM | POA: Diagnosis not present

## 2019-09-16 DIAGNOSIS — Z9049 Acquired absence of other specified parts of digestive tract: Secondary | ICD-10-CM | POA: Diagnosis not present

## 2019-09-16 DIAGNOSIS — E860 Dehydration: Secondary | ICD-10-CM | POA: Insufficient documentation

## 2019-09-16 DIAGNOSIS — Y999 Unspecified external cause status: Secondary | ICD-10-CM | POA: Insufficient documentation

## 2019-09-16 DIAGNOSIS — I10 Essential (primary) hypertension: Secondary | ICD-10-CM | POA: Diagnosis not present

## 2019-09-16 DIAGNOSIS — R519 Headache, unspecified: Secondary | ICD-10-CM | POA: Diagnosis not present

## 2019-09-16 DIAGNOSIS — Z8673 Personal history of transient ischemic attack (TIA), and cerebral infarction without residual deficits: Secondary | ICD-10-CM | POA: Insufficient documentation

## 2019-09-16 DIAGNOSIS — Y939 Activity, unspecified: Secondary | ICD-10-CM | POA: Diagnosis not present

## 2019-09-16 DIAGNOSIS — R58 Hemorrhage, not elsewhere classified: Secondary | ICD-10-CM | POA: Diagnosis not present

## 2019-09-16 DIAGNOSIS — S91119A Laceration without foreign body of unspecified toe without damage to nail, initial encounter: Secondary | ICD-10-CM

## 2019-09-16 DIAGNOSIS — E1122 Type 2 diabetes mellitus with diabetic chronic kidney disease: Secondary | ICD-10-CM | POA: Diagnosis not present

## 2019-09-16 DIAGNOSIS — I700 Atherosclerosis of aorta: Secondary | ICD-10-CM | POA: Diagnosis not present

## 2019-09-16 DIAGNOSIS — Z87891 Personal history of nicotine dependence: Secondary | ICD-10-CM | POA: Insufficient documentation

## 2019-09-16 DIAGNOSIS — Z79899 Other long term (current) drug therapy: Secondary | ICD-10-CM | POA: Diagnosis not present

## 2019-09-16 DIAGNOSIS — R109 Unspecified abdominal pain: Secondary | ICD-10-CM | POA: Insufficient documentation

## 2019-09-16 DIAGNOSIS — Y929 Unspecified place or not applicable: Secondary | ICD-10-CM | POA: Insufficient documentation

## 2019-09-16 LAB — CBC WITH DIFFERENTIAL/PLATELET
Abs Immature Granulocytes: 0.06 10*3/uL (ref 0.00–0.07)
Basophils Absolute: 0 10*3/uL (ref 0.0–0.1)
Basophils Relative: 0 %
Eosinophils Absolute: 0.2 10*3/uL (ref 0.0–0.5)
Eosinophils Relative: 2 %
HCT: 39.9 % (ref 39.0–52.0)
Hemoglobin: 12.5 g/dL — ABNORMAL LOW (ref 13.0–17.0)
Immature Granulocytes: 1 %
Lymphocytes Relative: 12 %
Lymphs Abs: 1.4 10*3/uL (ref 0.7–4.0)
MCH: 30.7 pg (ref 26.0–34.0)
MCHC: 31.3 g/dL (ref 30.0–36.0)
MCV: 98 fL (ref 80.0–100.0)
Monocytes Absolute: 0.6 10*3/uL (ref 0.1–1.0)
Monocytes Relative: 5 %
Neutro Abs: 9.9 10*3/uL — ABNORMAL HIGH (ref 1.7–7.7)
Neutrophils Relative %: 80 %
Platelets: 215 10*3/uL (ref 150–400)
RBC: 4.07 MIL/uL — ABNORMAL LOW (ref 4.22–5.81)
RDW: 13.2 % (ref 11.5–15.5)
WBC: 12.2 10*3/uL — ABNORMAL HIGH (ref 4.0–10.5)
nRBC: 0 % (ref 0.0–0.2)

## 2019-09-16 LAB — BASIC METABOLIC PANEL
Anion gap: 8 (ref 5–15)
BUN: 41 mg/dL — ABNORMAL HIGH (ref 8–23)
CO2: 24 mmol/L (ref 22–32)
Calcium: 8.8 mg/dL — ABNORMAL LOW (ref 8.9–10.3)
Chloride: 109 mmol/L (ref 98–111)
Creatinine, Ser: 2.01 mg/dL — ABNORMAL HIGH (ref 0.61–1.24)
GFR calc Af Amer: 33 mL/min — ABNORMAL LOW (ref >60)
GFR calc non Af Amer: 29 mL/min — ABNORMAL LOW (ref >60)
Glucose, Bld: 124 mg/dL — ABNORMAL HIGH (ref 70–99)
Potassium: 4.7 mmol/L (ref 3.5–5.1)
Sodium: 141 mmol/L (ref 135–145)

## 2019-09-16 MED ORDER — SODIUM CHLORIDE 0.9 % IV BOLUS
500.0000 mL | Freq: Once | INTRAVENOUS | Status: AC
  Administered 2019-09-16: 500 mL via INTRAVENOUS

## 2019-09-16 NOTE — ED Triage Notes (Signed)
Pt has been having issues with constipation. Was prescribed mag citrate. Tried to have a BM and fell from seated position onto floor. Has a laceration to foot. Also complaining of tailbone pain. No loc. No deficits.

## 2019-09-16 NOTE — Discharge Instructions (Signed)
You were seen in the emergency department for evaluation of injuries from a fall.  You had a CAT scan of your head and abdomen and pelvis that did not show any serious findings.  Your blood work showed that you were dehydrated.  CAT scan did not show much constipation.  Drink plenty of fluids.  Tylenol as needed for pain.  Follow-up with your doctor.  Return to the emergency department for any worsening or concerning symptoms.

## 2019-09-16 NOTE — Telephone Encounter (Signed)
Magnesium citrate would be suggested x 1 and call if no results.  Lets make sure referral to GI is placed if not already a pt. If a pt have them set up appt.

## 2019-09-16 NOTE — ED Provider Notes (Signed)
[facility] EMERGENCY DEPARTMENT Provider Note   CSN: [id number] Arrival date & time: 09/16/19  1818     History Chief Complaint  Patient presents with  . Fall    Mike Briggs is a 86 y.o. male.  He has a prior history of stroke.  He was here a few days ago for constipation and was sent home some mag citrate.  He was in the bathroom trying to have a bowel movement when he fell onto the floor and sustained a laceration to his right second toe.  Complaining of some pain to his backside.  Unclear if this is rectal pain from not moving his bowels or from falling.  No loss of consciousness.  The history is provided by the patient and the EMS personnel.  Fall This is a new problem. The current episode started 1 to 2 hours ago. The problem has not changed since onset.Pertinent negatives include no chest pain, no abdominal pain, no headaches and no shortness of breath. Nothing aggravates the symptoms. Nothing relieves the symptoms. He has tried nothing for the symptoms. The treatment provided no relief.       Past Medical History:  Diagnosis Date  . Acute cystitis 12/17/2018  . Acute encephalopathy 11/14/2018  . ANKLE, ARTHRITIS, DEGEN./OSTEO 12/16/2008   Qualifier: Diagnosis of  By: Aline Brochure MD, Dorothyann Peng    . Arthritis   . Cataract   . Chronic kidney disease   . COVID-19   . COVID-19 virus detected 11/03/2018  . COVID-19 virus infection 11/03/2018  . Dehydration 11/02/2018  . Depression   . Diabetes mellitus   . Gram-positive bacteremia 11/03/2018  . Hyperlipidemia   . Hypertension   . Hypomagnesemia 03/24/2019  . Klebsiella UTI  03/25/2019  . Pain in finger of right hand 05/27/2019  . Personal history of gout 05/22/2016  . Rhabdomyolysis 11/02/2018  . Stroke ([place])   . Swelling of both hands 05/27/2019  . Syncope 02/25/2018  . TIA (transient ischemic attack) 11/14/2018  . UTI (urinary tract infection) 02/26/2018  . Vitamin D deficiency 05/31/2016    Patient Active Problem List    Diagnosis Date Noted  . Hydrocele 05/27/2019  . Mobitz type 1 second degree atrioventricular block   . Sacral decubitus ulcer, stage III ([place]) 11/11/2018  . Pressure injury of skin 11/08/2018  . Essential hypertension 05/22/2016  . Hemiparesis affecting right side as late effect of cerebrovascular accident (CVA) ([place]) 05/22/2016  . BPH (benign prostatic hyperplasia) 05/22/2016  . Chronic constipation 05/22/2016  . HLD (hyperlipidemia) 05/22/2016  . Pseudophakia 12/06/2015  . Vascular dementia ([place]) 10/23/2012  . Chronic kidney disease, stage III (moderate) ([place]) 10/20/2012  . Type 2 diabetes mellitus with diabetic chronic kidney disease ([place]) 12/16/2008    Past Surgical History:  Procedure Laterality Date  . BACK SURGERY     cervical and lumbar  . CATARACT EXTRACTION    . CHOLECYSTECTOMY    . FOOT SURGERY     5th toe   . SPINE SURGERY     lumbar and cervical fusions       Family History  Family history unknown: Yes    Social History   Tobacco Use  . Smoking status: Former Smoker    Quit date: 08/07/1967    Years since quitting: 52.1  . Smokeless tobacco: Never Used  Vaping Use  . Vaping Use: Never used  Substance Use Topics  . Alcohol use: No  . Drug use: No    Home Medications Prior to Admission  medications   Medication Sig Start Date End Date Taking? Authorizing Provider  acetaminophen (TYLENOL) 325 MG tablet Take 2 tablets (650 mg total) by mouth every 6 (six) hours as needed for mild pain (or Fever >/= 101). 03/25/19   Emokpae, Courage, MD  amLODipine (NORVASC) 10 MG tablet TAKE ONE TABLET BY MOUTH ONCE DAILY. 09/01/19   Perlie Mayo, NP  aspirin 81 MG chewable tablet Chew 1 tablet (81 mg total) by mouth daily with breakfast. 03/25/19   Roxan Hockey, MD  atorvastatin (LIPITOR) 10 MG tablet Take 1 tablet (10 mg total) by mouth every evening. 03/25/19   Denton Brick, Courage, MD  cephALEXin (KEFLEX) 250 MG capsule Take 1 capsule (250 mg total) by mouth 3 (three)  times daily for 30 doses. Take cephalexin 250mg  po tid #30 and then start cephalexin 500mg  po qhs #30 with 5 refills for suppression.  09/14/19 09/24/19  Irine Seal, MD  cephALEXin (KEFLEX) 500 MG capsule Take 1 capsule (500 mg total) by mouth at bedtime for 180 doses. 09/24/19 03/22/20  Irine Seal, MD  finasteride (PROSCAR) 5 MG tablet Take 1 tablet (5 mg total) by mouth daily. 09/11/19   Irine Seal, MD  LANTUS SOLOSTAR 100 UNIT/ML Solostar Pen Inject 20 Units into the skin daily. 05/27/19   Perlie Mayo, NP  lisinopril (ZESTRIL) 40 MG tablet TAKE (1) TABLET BY MOUTH ONCE DAILY. 09/01/19   Perlie Mayo, NP  meloxicam (MOBIC) 7.5 MG tablet Take 1 tablet (7.5 mg total) by mouth daily. 05/29/19   Carole Civil, MD  memantine (NAMENDA) 10 MG tablet TAKE (1) TABLET BY MOUTH TWICE DAILY. 09/01/19   Perlie Mayo, NP  metoprolol succinate (TOPROL-XL) 25 MG 24 hr tablet TAKE (1) TABLET BY MOUTH ONCE DAILY. 09/01/19   Perlie Mayo, NP  tamsulosin (FLOMAX) 0.4 MG CAPS capsule Take 1 capsule (0.4 mg total) by mouth daily after breakfast. 03/25/19   Roxan Hockey, MD  vitamin C (VITAMIN C) 500 MG tablet Take 1 tablet (500 mg total) by mouth daily. 11/19/18   Eugenie Filler, MD  zinc sulfate 220 (50 Zn) MG capsule Take 1 capsule (220 mg total) by mouth daily. 03/25/19   Roxan Hockey, MD    Allergies    Patient has no known allergies.  Review of Systems   Review of Systems  Constitutional: Negative for fever.  HENT: Negative for sore throat.   Eyes: Negative for visual disturbance.  Respiratory: Negative for shortness of breath.   Cardiovascular: Negative for chest pain.  Gastrointestinal: Positive for constipation. Negative for abdominal pain.  Genitourinary: Negative for dysuria.  Musculoskeletal: Negative for back pain.  Skin: Negative for rash.  Neurological: Negative for headaches.    Physical Exam Updated Vital Signs BP 124/72 (BP Location: Left Arm)   Pulse 71   Temp  98.8 F (37.1 C) (Oral)   Resp 16   Ht 5\' 11"  (1.803 m)   Wt 102.1 kg   SpO2 98%   BMI 31.38 kg/m   Physical Exam Vitals and nursing note reviewed.  Constitutional:      Appearance: Normal appearance. He is well-developed.  HENT:     Head: Normocephalic and atraumatic.  Eyes:     Conjunctiva/sclera: Conjunctivae normal.  Cardiovascular:     Rate and Rhythm: Normal rate and regular rhythm.     Heart sounds: No murmur heard.   Pulmonary:     Effort: Pulmonary effort is normal. No respiratory distress.     Breath  sounds: Normal breath sounds.  Abdominal:     Palpations: Abdomen is soft.     Tenderness: There is no abdominal tenderness.  Genitourinary:    Comments: Rectal exam done with Tiffany as chaperone.  Normal tone soft stool in vault. Musculoskeletal:        General: Signs of injury present. No deformity.     Cervical back: Neck supple.     Comments: He is a laceration right toe second at the base on the plantar surface.  He has no pelvic or sacral tenderness.  Skin:    General: Skin is warm and dry.  Neurological:     Mental Status: He is alert. Mental status is at baseline.     ED Results / Procedures / Treatments   Labs (all labs ordered are listed, but only abnormal results are displayed) Labs Reviewed  BASIC METABOLIC PANEL - Abnormal; Notable for the following components:      Result Value   Glucose, Bld 124 (*)    BUN 41 (*)    Creatinine, Ser 2.01 (*)    Calcium 8.8 (*)    GFR calc non Af Amer 29 (*)    GFR calc Af Amer 33 (*)    All other components within normal limits  CBC WITH DIFFERENTIAL/PLATELET - Abnormal; Notable for the following components:   WBC 12.2 (*)    RBC 4.07 (*)    Hemoglobin 12.5 (*)    Neutro Abs 9.9 (*)    All other components within normal limits    EKG None  Radiology CT Abdomen Pelvis Wo Contrast  Result Date: 09/16/2019 CLINICAL DATA:  Lower bilateral abdominal pain, constipation EXAM: CT ABDOMEN AND PELVIS  WITHOUT CONTRAST TECHNIQUE: Multidetector CT imaging of the abdomen and pelvis was performed following the standard protocol without IV contrast. COMPARISON:  10/12/2012 FINDINGS: Lower chest: Stable bibasilar scarring. Minimal ground-glass opacities are seen within the right upper lobe, right middle lobe, and lingula. This could reflect hypoventilatory change, inflammation, or infection. Hepatobiliary: No focal liver abnormality is seen. Status post cholecystectomy. No biliary dilatation. Pancreas: Unremarkable. No pancreatic ductal dilatation or surrounding inflammatory changes. Spleen: Normal in size without focal abnormality. Adrenals/Urinary Tract: No urinary tract calculi or obstructive uropathy. The adrenals are unremarkable. Bladder is moderately distended with no urinary tract calculi or obstruction. Stomach/Bowel: No bowel obstruction or ileus. Normal appendix right lower quadrant. Minimal retained stool throughout the colon. No bowel wall thickening or inflammatory change. Vascular/Lymphatic: Aortic atherosclerosis. No enlarged abdominal or pelvic lymph nodes. Reproductive: Prostate is unremarkable. Other: No free fluid or free gas. No abdominal wall hernia. Musculoskeletal: Extensive postsurgical changes are seen within the lower lumbar spine. There are no acute or destructive bony lesions. Chronic cortical and trabecular thickening within the proximal left femur could reflect underlying Paget's disease. No change since 2014. Reconstructed images demonstrate no additional findings. IMPRESSION: 1. Minimal stool throughout the colon. No bowel obstruction or signs of constipation. 2. Minimal ground-glass opacities within the lower lungs, which could reflect hypoventilatory change, inflammation, or infection. 3. Aortic Atherosclerosis (ICD10-I70.0). Electronically Signed   By: Randa Ngo M.D.   On: 09/16/2019 23:01   CT Head Wo Contrast  Result Date: 09/16/2019 CLINICAL DATA:  Constipation. EXAM: CT  HEAD WITHOUT CONTRAST TECHNIQUE: Contiguous axial images were obtained from the base of the skull through the vertex without intravenous contrast. COMPARISON:  November 13, 2018 FINDINGS: Brain: There is moderate severity cerebral atrophy with widening of the extra-axial spaces and ventricular dilatation. There  are areas of decreased attenuation within the white matter tracts of the supratentorial brain, consistent with microvascular disease changes. Vascular: No hyperdense vessel or unexpected calcification. Skull: Normal. Negative for fracture or focal lesion. Sinuses/Orbits: No acute finding. Other: None. IMPRESSION: 1. Generalized cerebral atrophy. 2. No acute intracranial abnormality. Electronically Signed   By: Virgina Norfolk M.D.   On: 09/16/2019 23:04    Procedures Procedures (including critical care time)  Medications Ordered in ED Medications  sodium chloride 0.9 % bolus 500 mL (has no administration in time range)    ED Course  I have reviewed the triage vital signs and the nursing notes.  Pertinent labs & imaging results that were available during my care of the patient were reviewed by me and considered in my medical decision making (see chart for details).  Clinical Course as of Sep 16 1129  Wed Sep 16, 2019  2318 Reviewed reviewed the work-up with the patient.  Recommended that he increase his fluid intake.  Do not think he needs repair of the laceration as it stopped bleeding.  We will have this wrapped up.  He said we can call his niece to get him a ride home.   [MB]    Clinical Course User Index [MB] Hayden Rasmussen, MD   MDM Rules/Calculators/A&P                         This patient complains of fall, feeling constipated, buttock pain; this involves an extensive number of treatment Options and is a complaint that carries with it a high risk of complications and Morbidity. The differential includes head injury, intracerebral bleed, fracture, constipation,  obstruction, diverticulitis, perirectal abscess, fecal impaction  I ordered, reviewed and interpreted labs, which included CBC with mildly elevated white count, stable hemoglobin, chemistry showing worsening BUN and creatinine from baseline. I ordered medication IV fluids I ordered imaging studies which included CT head CT abdomen and pelvis and I independently    visualized and interpreted imaging which showed no acute bleed or fracture, no significant signs of constipation, no acute bony findings Additional history obtained from EMS Previous records obtained and reviewed prior records including ED visit few days ago for subjective constipation  After the interventions stated above, I reevaluated the patient and found patient to be hemodynamically stable.  I reviewed the results of his testing with him.  He is comfortable with plan for going home.  He asked if he would contact his niece to help him get home.  Return instructions discussed   Final Clinical Impression(s) / ED Diagnoses Final diagnoses:  Fall, initial encounter  Dehydration  Laceration of toe of right foot without foreign body present or damage to nail, unspecified toe, initial encounter    Rx / DC Orders ED Discharge Orders    None       Hayden Rasmussen, MD 09/17/19 1134

## 2019-09-16 NOTE — Telephone Encounter (Signed)
Pt recently seen in ED for constipation has tried an enema, Miralax and what was given at the hospital, Niece stated pt is in pain because he still has not gone to the bathroom. They are wondering if there is something else that they can do.

## 2019-09-17 NOTE — ED Notes (Signed)
EMS called for pt discharge.

## 2019-09-17 NOTE — Telephone Encounter (Signed)
Pt went to ED, CT did not should excessive amount of stool However it appears he was dehydrated and had a fall. We are seeing him soon, should visit placement needs.

## 2019-10-09 ENCOUNTER — Ambulatory Visit: Payer: Medicare Other | Admitting: Family Medicine

## 2019-10-12 DIAGNOSIS — R069 Unspecified abnormalities of breathing: Secondary | ICD-10-CM | POA: Diagnosis not present

## 2019-10-13 ENCOUNTER — Ambulatory Visit: Payer: Medicare Other | Admitting: Family Medicine

## 2019-10-22 ENCOUNTER — Ambulatory Visit: Payer: Medicare Other | Admitting: Family Medicine

## 2019-10-30 ENCOUNTER — Ambulatory Visit: Payer: Medicare Other | Admitting: Urology

## 2019-10-30 NOTE — Progress Notes (Deleted)
Subjective: 1. BPH with urinary obstruction      Mike Briggs returns today in f/u he has a history of BPH with bOO and has been on tamsulosin.  Finasteride was added at his last visit but he has run out.  He was found to have Klebsiella at his last visit and was given keflex for therapy and now suppression with 250mg  nightly.   He continues to have cloudy urine and dysuria.  He has had no hematuria.  His PVR today is 115ml and his IPSS is 29 but he feels like he is voiding ok.  A subsequent cath PVR was 345ml and the urine was cloudy.   GU Hx: Mike Briggs is an 86 yo male who is sent in consultation by Cherly Beach NP for bilateral hydroceles.   He had a scrotal US in 2018 that confirmed hydroceles.  He has also had recurrent UTI's with cloudy white urine.  He has some dysuria.  He has some urgency.  He has minimal nocturia but can have enuresis.  He has a weak stream and a sensation of incomplete emptying.  He has been constipation and started a stool softener.  He has had no hematuria.  He has no recollection of any GU surgery.  He has had no stones.   He has been hospitalized for UTI's with the most recent trip to the ER on 06/05/19.  The culture grew citrobacter. He had klebsiella on 03/23/19, 12/17/18, 02/25/18 and 10/08/17.  When the urine starts he can't stop it.  He does have some sputtering of the stream.   He had a CT in 2014 and the bladder was noted to be distented.   ROS:  ROS  No Known Allergies  Past Medical History:  Diagnosis Date   Acute cystitis 12/17/2018   Acute encephalopathy 11/14/2018   ANKLE, ARTHRITIS, DEGEN./OSTEO 12/16/2008   Qualifier: Diagnosis of  By: Aline Brochure MD, [place]     Arthritis    Cataract    Chronic kidney disease    COVID-19    COVID-19 virus detected 11/03/2018   COVID-19 virus infection 11/03/2018   Dehydration 11/02/2018   Depression    Diabetes mellitus    Gram-positive bacteremia 11/03/2018   Hyperlipidemia    Hypertension     Hypomagnesemia 03/24/2019   Klebsiella UTI  03/25/2019   Pain in finger of right hand 05/27/2019   Personal history of gout 05/22/2016   Rhabdomyolysis 11/02/2018   Stroke ([place])    Swelling of both hands 05/27/2019   Syncope 02/25/2018   TIA (transient ischemic attack) 11/14/2018   UTI (urinary tract infection) 02/26/2018   Vitamin D deficiency 05/31/2016    Past Surgical History:  Procedure Laterality Date   BACK SURGERY     cervical and lumbar   CATARACT EXTRACTION     CHOLECYSTECTOMY     FOOT SURGERY     5th toe    SPINE SURGERY     lumbar and cervical fusions    Social History   Socioeconomic History   Marital status: Divorced    Spouse name: Not on file   Number of children: 3   Years of education: 10   Highest education level: Not on file  Occupational History   Occupation: Retired.  Tobacco Use   Smoking status: Former Smoker    Quit date: 08/07/1967    Years since quitting: 52.2   Smokeless tobacco: Never Used  Vaping Use   Vaping Use: Never used  Substance and Sexual Activity  Alcohol use: No   Drug use: No   Sexual activity: Not Currently  Other Topics Concern   Not on file  Social History Narrative   Wheelchair bound.Has not walked in many years, at least five years.  Lives alone. Has 2 aides that come in to help him.   Lives alone.    Retired.   Eats all food groups.    Has 3 children but does not have contact with them.   Divorced.   Social Determinants of Health   Financial Resource Strain:    Difficulty of Paying Living Expenses: Not on file  Food Insecurity:    Worried About Charity fundraiser in the Last Year: Not on file   [employer] of Food in the Last Year: Not on file  Transportation Needs: No Transportation Needs   Lack of Transportation (Medical): No   Lack of Transportation (Non-Medical): No  Physical Activity: Inactive   Days of Exercise per Week: 0 days   Minutes of Exercise per Session: 0 min  Stress:  No Stress Concern Present   Feeling of Stress : Not at all  Social Connections: Socially Isolated   Frequency of Communication with Friends and Family: Once a week   Frequency of Social Gatherings with Friends and Family: Once a week   Attends Religious Services: More than 4 times per year   Active Member of [employer] or Organizations: No   Attends Archivist Meetings: Never   Marital Status: Divorced  Human resources officer Violence:    Fear of Current or Ex-Partner: Not on file   Emotionally Abused: Not on file   Physically Abused: Not on file   Sexually Abused: Not on file    Family History  Family history unknown: Yes    Anti-infectives: Anti-infectives (From admission, onward)   None      Current Outpatient Medications  Medication Sig Dispense Refill   acetaminophen (TYLENOL) 325 MG tablet Take 2 tablets (650 mg total) by mouth every 6 (six) hours as needed for mild pain (or Fever >/= 101). 12 tablet 0   amLODipine (NORVASC) 10 MG tablet TAKE ONE TABLET BY MOUTH ONCE DAILY. 30 tablet 0   aspirin 81 MG chewable tablet Chew 1 tablet (81 mg total) by mouth daily with breakfast. 30 tablet 3   atorvastatin (LIPITOR) 10 MG tablet Take 1 tablet (10 mg total) by mouth every evening. 30 tablet 5   cephALEXin (KEFLEX) 500 MG capsule Take 1 capsule (500 mg total) by mouth at bedtime for 180 doses. 30 capsule 5   finasteride (PROSCAR) 5 MG tablet Take 1 tablet (5 mg total) by mouth daily. 90 tablet 3   LANTUS SOLOSTAR 100 UNIT/ML Solostar Pen Inject 20 Units into the skin daily. 15 mL 3   lisinopril (ZESTRIL) 40 MG tablet TAKE (1) TABLET BY MOUTH ONCE DAILY. (Patient taking differently: Take 40 mg by mouth daily. ) 30 tablet 0   meloxicam (MOBIC) 7.5 MG tablet Take 1 tablet (7.5 mg total) by mouth daily. 30 tablet 5   memantine (NAMENDA) 10 MG tablet TAKE (1) TABLET BY MOUTH TWICE DAILY. (Patient taking differently: Take 10 mg by mouth 2 (two) times daily. ) 60  tablet 0   metoprolol succinate (TOPROL-XL) 25 MG 24 hr tablet TAKE (1) TABLET BY MOUTH ONCE DAILY. (Patient taking differently: Take 25 mg by mouth daily. ) 30 tablet 0   tamsulosin (FLOMAX) 0.4 MG CAPS capsule Take 1 capsule (0.4 mg total) by mouth daily  after breakfast. 30 capsule 3   vitamin C (VITAMIN C) 500 MG tablet Take 1 tablet (500 mg total) by mouth daily.     zinc sulfate 220 (50 Zn) MG capsule Take 1 capsule (220 mg total) by mouth daily.     No current facility-administered medications for this visit.     Objective: Vital signs in last 24 hours: There were no vitals taken for this visit.  Intake/Output from previous day: No intake/output data recorded. Intake/Output this shift: @IOTHISSHIFT @   Physical Exam Constitutional:      Appearance: Normal appearance.     Comments: Able to stand with assistance.   Neurological:     Mental Status: He is alert.     Lab Results:  No results found for this or any previous visit (from the past 24 hour(s)).  BMET No results for input(s): NA, K, CL, CO2, GLUCOSE, BUN, CREATININE, CALCIUM in the last 72 hours. PT/INR No results for input(s): LABPROT, INR in the last 72 hours. ABG No results for input(s): PHART, HCO3 in the last 72 hours.  Invalid input(s): PCO2, PO2  Studies/Results: No results found.   Assessment/Plan: BPH with BOO and incomplete emptying.  I will refill the finasteride.  Chronic cystitis.  His urine is very purulent today.  Urine culture sent.  I will adjust antibiotics as needed.     No orders of the defined types were placed in this encounter.    No orders of the defined types were placed in this encounter.    No follow-ups on file.    CC: Cherly Beach NP    Irine Seal 10/30/2019 [phone]

## 2019-12-03 ENCOUNTER — Other Ambulatory Visit: Payer: Self-pay | Admitting: Family Medicine

## 2019-12-04 ENCOUNTER — Other Ambulatory Visit: Payer: Self-pay

## 2019-12-04 DIAGNOSIS — N401 Enlarged prostate with lower urinary tract symptoms: Secondary | ICD-10-CM

## 2019-12-04 DIAGNOSIS — E1122 Type 2 diabetes mellitus with diabetic chronic kidney disease: Secondary | ICD-10-CM

## 2019-12-04 DIAGNOSIS — N138 Other obstructive and reflux uropathy: Secondary | ICD-10-CM

## 2019-12-04 DIAGNOSIS — M19049 Primary osteoarthritis, unspecified hand: Secondary | ICD-10-CM

## 2019-12-04 MED ORDER — TAMSULOSIN HCL 0.4 MG PO CAPS
0.4000 mg | ORAL_CAPSULE | Freq: Every day | ORAL | 3 refills | Status: DC
Start: 2019-12-04 — End: 2020-01-15

## 2019-12-04 MED ORDER — ASPIRIN 81 MG PO CHEW: 81.0000 mg | CHEWABLE_TABLET | Freq: Every day | ORAL | 3 refills | Status: AC

## 2019-12-04 MED ORDER — MELOXICAM 7.5 MG PO TABS: 7.5000 mg | ORAL_TABLET | Freq: Every day | ORAL | 5 refills | Status: DC

## 2019-12-04 MED ORDER — ATORVASTATIN CALCIUM 10 MG PO TABS
10.0000 mg | ORAL_TABLET | Freq: Every evening | ORAL | 5 refills | Status: DC
Start: 2019-12-04 — End: 2020-05-26

## 2019-12-04 MED ORDER — FINASTERIDE 5 MG PO TABS: 5.0000 mg | ORAL_TABLET | Freq: Every day | ORAL | 3 refills | Status: DC

## 2019-12-04 MED ORDER — LANTUS SOLOSTAR 100 UNIT/ML ~~LOC~~ SOPN: 20.0000 [IU] | PEN_INJECTOR | Freq: Every day | SUBCUTANEOUS | 3 refills | Status: DC

## 2019-12-04 MED ORDER — LISINOPRIL 40 MG PO TABS: ORAL_TABLET | ORAL | 0 refills | Status: DC

## 2019-12-04 MED ORDER — METOPROLOL SUCCINATE ER 25 MG PO TB24: ORAL_TABLET | ORAL | 0 refills | Status: DC

## 2019-12-04 MED ORDER — AMLODIPINE BESYLATE 10 MG PO TABS
10.0000 mg | ORAL_TABLET | Freq: Every day | ORAL | 0 refills | Status: DC
Start: 2019-12-04 — End: 2020-01-26

## 2019-12-04 MED ORDER — MEMANTINE HCL 10 MG PO TABS: ORAL_TABLET | ORAL | 0 refills | Status: DC

## 2019-12-09 NOTE — Patient Instructions (Addendum)
Plan:  Polyarthritis He has hx of arthritis pain -he takes meloxicam 7.5 mg daily -he has hx of chronic kidney disease Stage 3b, so don't want to increase this unless necessary -his pain is worse in his ankles, and he has some non-pitting edema -will treat with gabapentin TID PRN for his ankle pain -if this doesn'timprove, will consider increasing meloxicam  Pressure Ulcer, stg 2 We discussed offloading strategies -he will get egg crate foam for his wheel chair -looks like it is healing well Can use foam dressing as well, and those can be purchased at any local pharmacy -may use neosporin to scabbed areas

## 2019-12-10 ENCOUNTER — Encounter: Payer: Self-pay | Admitting: Nurse Practitioner

## 2019-12-10 ENCOUNTER — Other Ambulatory Visit: Payer: Self-pay

## 2019-12-10 ENCOUNTER — Ambulatory Visit (INDEPENDENT_AMBULATORY_CARE_PROVIDER_SITE_OTHER): Payer: Medicare Other | Admitting: Nurse Practitioner

## 2019-12-10 VITALS — BP 137/86 | HR 83 | Temp 97.0°F | Resp 18 | Ht 71.0 in | Wt 225.0 lb

## 2019-12-10 DIAGNOSIS — M130 Polyarthritis, unspecified: Secondary | ICD-10-CM

## 2019-12-10 DIAGNOSIS — Z23 Encounter for immunization: Secondary | ICD-10-CM

## 2019-12-10 MED ORDER — GABAPENTIN 100 MG PO CAPS: 100.0000 mg | ORAL_CAPSULE | Freq: Three times a day (TID) | ORAL | 3 refills | Status: DC | PRN

## 2019-12-10 NOTE — Progress Notes (Signed)
Acute Office Visit  Subjective:    Patient ID: Mike Briggs, male    DOB: [birth year], 86 y.o.   MRN: [id number] CC: Ankle Pain and Sore to gluteal cleft  HPI Patient is in today for bilateral ankle pain.  He states his pain has been ongoing for "a long time".  He rates his pain at 10/10.  He has no known mechanism of injury.  He has been taking meloxicam for arthritis, but this isn't helping.  He states his feet hurt worse when he has leg swelling.  Past Medical History:  Diagnosis Date  . Acute cystitis 12/17/2018  . Acute encephalopathy 11/14/2018  . ANKLE, ARTHRITIS, DEGEN./OSTEO 12/16/2008   Qualifier: Diagnosis of  By: Aline Brochure MD, Dorothyann Peng    . Arthritis   . Cataract   . Chronic kidney disease   . COVID-19   . COVID-19 virus detected 11/03/2018  . COVID-19 virus infection 11/03/2018  . Dehydration 11/02/2018  . Depression   . Diabetes mellitus   . Gram-positive bacteremia 11/03/2018  . Hyperlipidemia   . Hypertension   . Hypomagnesemia 03/24/2019  . Klebsiella UTI  03/25/2019  . Pain in finger of right hand 05/27/2019  . Personal history of gout 05/22/2016  . Rhabdomyolysis 11/02/2018  . Stroke ([place])   . Swelling of both hands 05/27/2019  . Syncope 02/25/2018  . TIA (transient ischemic attack) 11/14/2018  . UTI (urinary tract infection) 02/26/2018  . Vitamin D deficiency 05/31/2016    Past Surgical History:  Procedure Laterality Date  . BACK SURGERY     cervical and lumbar  . CATARACT EXTRACTION    . CHOLECYSTECTOMY    . FOOT SURGERY     5th toe   . SPINE SURGERY     lumbar and cervical fusions    Family History  Family history unknown: Yes    Social History   Socioeconomic History  . Marital status: Divorced    Spouse name: Not on file  . Number of children: 3  . Years of education: 10  . Highest education level: Not on file  Occupational History  . Occupation: Retired.  Tobacco Use  . Smoking status: Former Smoker    Quit date: 08/07/1967    Years  since quitting: 52.3  . Smokeless tobacco: Never Used  Vaping Use  . Vaping Use: Never used  Substance and Sexual Activity  . Alcohol use: No  . Drug use: No  . Sexual activity: Not Currently  Other Topics Concern  . Not on file  Social History Narrative   Wheelchair bound.Has not walked in many years, at least five years.  Lives alone. Has 2 aides that come in to help him.   Lives alone.    Retired.   Eats all food groups.    Has 3 children but does not have contact with them.   Divorced.   Social Determinants of Health   Financial Resource Strain:   . Difficulty of Paying Living Expenses: Not on file  Food Insecurity:   . Worried About Charity fundraiser in the Last Year: Not on file  . Ran Out of Food in the Last Year: Not on file  Transportation Needs: No Transportation Needs  . Lack of Transportation (Medical): No  . Lack of Transportation (Non-Medical): No  Physical Activity: Inactive  . Days of Exercise per Week: 0 days  . Minutes of Exercise per Session: 0 min  Stress: No Stress Concern Present  . Feeling of Stress :  Not at all  Social Connections: Socially Isolated  . Frequency of Communication with Friends and Family: Once a week  . Frequency of Social Gatherings with Friends and Family: Once a week  . Attends Religious Services: More than 4 times per year  . Active Member of Clubs or Organizations: No  . Attends Archivist Meetings: Never  . Marital Status: Divorced  Human resources officer Violence:   . Fear of Current or Ex-Partner: Not on file  . Emotionally Abused: Not on file  . Physically Abused: Not on file  . Sexually Abused: Not on file    Outpatient Medications Prior to Visit  Medication Sig Dispense Refill  . acetaminophen (TYLENOL) 325 MG tablet Take 2 tablets (650 mg total) by mouth every 6 (six) hours as needed for mild pain (or Fever >/= 101). 12 tablet 0  . amLODipine (NORVASC) 10 MG tablet Take 1 tablet (10 mg total) by mouth daily. 30  tablet 0  . aspirin 81 MG chewable tablet Chew 1 tablet (81 mg total) by mouth daily with breakfast. 30 tablet 3  . atorvastatin (LIPITOR) 10 MG tablet Take 1 tablet (10 mg total) by mouth every evening. 30 tablet 5  . cephALEXin (KEFLEX) 500 MG capsule Take 1 capsule (500 mg total) by mouth at bedtime for 180 doses. 30 capsule 5  . finasteride (PROSCAR) 5 MG tablet Take 1 tablet (5 mg total) by mouth daily. 90 tablet 3  . LANTUS SOLOSTAR 100 UNIT/ML Solostar Pen Inject 20 Units into the skin daily. 15 mL 3  . lisinopril (ZESTRIL) 40 MG tablet TAKE (1) TABLET BY MOUTH ONCE DAILY. 30 tablet 0  . meloxicam (MOBIC) 7.5 MG tablet Take 1 tablet (7.5 mg total) by mouth daily. 30 tablet 5  . memantine (NAMENDA) 10 MG tablet TAKE (1) TABLET BY MOUTH TWICE DAILY. 60 tablet 0  . metoprolol succinate (TOPROL-XL) 25 MG 24 hr tablet TAKE (1) TABLET BY MOUTH ONCE DAILY. 30 tablet 0  . tamsulosin (FLOMAX) 0.4 MG CAPS capsule Take 1 capsule (0.4 mg total) by mouth daily after breakfast. 30 capsule 3  . vitamin C (VITAMIN C) 500 MG tablet Take 1 tablet (500 mg total) by mouth daily.    Marland Kitchen zinc sulfate 220 (50 Zn) MG capsule Take 1 capsule (220 mg total) by mouth daily.     No facility-administered medications prior to visit.    No Known Allergies  Review of Systems  Musculoskeletal: Positive for arthralgias.       Bilateral ankle pain per HPI       Objective:    Physical Exam Musculoskeletal:        General: Tenderness present. No signs of injury.     Right lower leg: Edema present.     Left lower leg: Edema present.     Comments: Mild ankle tenderness to palpation; non-pitting edema to bilateral ankles     BP 137/86 (BP Location: Right Arm, Patient Position: Sitting, Cuff Size: Normal)   Pulse 83   Temp (!) 97 F (36.1 C) (Oral)   Resp 18   Ht 5\' 11"  (1.803 m)   Wt 225 lb (102.1 kg)   SpO2 94%   BMI 31.38 kg/m  Wt Readings from Last 3 Encounters:  12/10/19 225 lb (102.1 kg)  09/16/19  225 lb (102.1 kg)  09/11/19 225 lb (102.1 kg)    Health Maintenance Due  Topic Date Due  . OPHTHALMOLOGY EXAM  12/25/2016  . FOOT EXAM  12/25/2017  .  INFLUENZA VACCINE  08/30/2019    There are no preventive care reminders to display for this patient.   Lab Results  Component Value Date   TSH 0.849 12/18/2018   Lab Results  Component Value Date   WBC 12.2 (H) 09/16/2019   HGB 12.5 (L) 09/16/2019   HCT 39.9 09/16/2019   MCV 98.0 09/16/2019   PLT 215 09/16/2019   Lab Results  Component Value Date   NA 141 09/16/2019   K 4.7 09/16/2019   CO2 24 09/16/2019   GLUCOSE 124 (H) 09/16/2019   BUN 41 (H) 09/16/2019   CREATININE 2.01 (H) 09/16/2019   BILITOT 1.0 03/24/2019   ALKPHOS 125 03/24/2019   AST 14 (L) 03/24/2019   ALT 10 03/24/2019   PROT 6.7 03/24/2019   ALBUMIN 3.1 (L) 03/24/2019   CALCIUM 8.8 (L) 09/16/2019   ANIONGAP 8 09/16/2019   Lab Results  Component Value Date   CHOL 146 11/14/2018   Lab Results  Component Value Date   HDL 28 (L) 11/14/2018   Lab Results  Component Value Date   LDLCALC 94 11/14/2018   Lab Results  Component Value Date   TRIG 120 11/14/2018   Lab Results  Component Value Date   CHOLHDL 5.2 11/14/2018   Lab Results  Component Value Date   HGBA1C 6.3 (A) 07/09/2019   HGBA1C 6.3 07/09/2019   HGBA1C 6.3 07/09/2019   HGBA1C 6.3 07/09/2019       Assessment & Plan:   Problem List Items Addressed This Visit    None     Plan:  Polyarthritis He has hx of arthritis pain -he takes meloxicam 7.5 mg daily -he has hx of CKD-3b, so don't want to increase this unless necessary -his pain is worse in his ankles, and he has some non-pitting edema -will treat with gabapentin TID PRN for his ankle pain -if this doesn'timprove, will consider increasing meloxicam  Pressure Ulcer, stg 2 We discussed offloading strategies -he will get egg crate foam for his wheel chair -looks like it is healing Can use foam dressing as  well   No orders of the defined types were placed in this encounter.    Noreene Larsson, NP

## 2019-12-10 NOTE — Assessment & Plan Note (Signed)
Plan:  Polyarthritis He has hx of arthritis pain -he takes meloxicam 7.5 mg daily -he has hx of CKD-3b, so don't want to increase this unless necessary -his pain is worse in his ankles, and he has some non-pitting edema -will treat with gabapentin TID PRN for his ankle pain -if this doesn'timprove, will consider increasing meloxicam  Pressure Ulcer, stg 2 We discussed offloading strategies -he will get egg crate foam for his wheel chair -looks like it is healing Can use foam dressing as well

## 2019-12-29 ENCOUNTER — Other Ambulatory Visit: Payer: Self-pay | Admitting: Family Medicine

## 2020-01-13 NOTE — Progress Notes (Signed)
Subjective: 1. Incomplete bladder emptying   2. BPH with urinary obstruction   3. Weak urinary stream   4. Chronic cystitis      01/15/20: [name] returns today in f/u.  He was placed back on keflex suppression after his last visit for a recurrent UTI but has not been on it for about 2 months. .  He remains on finasteride and tamsulosin.   He had a CT AP on 09/16/19.   His UA today still looks infected and his PVR remains 311ml.   He has dysuria but no hematuria and he doesn't wake too much at night.   09/11/19:[name] returns today in f/u he has a history of BPH with bOO and has been on tamsulosin.  Finasteride was added at his last visit but he has run out.  He was found to have Klebsiella at his last visit and was given keflex for therapy and now suppression with 250mg  nightly.   He continues to have cloudy urine and dysuria.  He has had no hematuria.  His PVR today is 175ml and his IPSS is 29 but he feels like he is voiding ok.  A subsequent cath PVR was 340ml and the urine was cloudy.   GU Hx: [name] is an 86 yo male who is sent in consultation by Cherly Beach NP for bilateral hydroceles.   He had a scrotal US in 2018 that confirmed hydroceles.  He has also had recurrent UTI's with cloudy white urine.  He has some dysuria.  He has some urgency.  He has minimal nocturia but can have enuresis.  He has a weak stream and a sensation of incomplete emptying.  He has been constipation and started a stool softener.  He has had no hematuria.  He has no recollection of any GU surgery.  He has had no stones.   He has been hospitalized for UTI's with the most recent trip to the ER on 06/05/19.  The culture grew citrobacter. He had klebsiella on 03/23/19, 12/17/18, 02/25/18 and 10/08/17.  When the urine starts he can't stop it.  He does have some sputtering of the stream.   He had a CT in 2014 and the bladder was noted to be distented.   ROS:  ROS  No Known Allergies  Past Medical History:  Diagnosis  Date  . Acute cystitis 12/17/2018  . Acute encephalopathy 11/14/2018  . ANKLE, ARTHRITIS, DEGEN./OSTEO 12/16/2008   Qualifier: Diagnosis of  By: Aline Brochure MD, Dorothyann Peng    . Arthritis   . Cataract   . Chronic kidney disease   . COVID-19   . COVID-19 virus detected 11/03/2018  . COVID-19 virus infection 11/03/2018  . Dehydration 11/02/2018  . Depression   . Diabetes mellitus   . Gram-positive bacteremia 11/03/2018  . Hyperlipidemia   . Hypertension   . Hypomagnesemia 03/24/2019  . Klebsiella UTI  03/25/2019  . Pain in finger of right hand 05/27/2019  . Personal history of gout 05/22/2016  . Rhabdomyolysis 11/02/2018  . Stroke ([place])   . Swelling of both hands 05/27/2019  . Syncope 02/25/2018  . TIA (transient ischemic attack) 11/14/2018  . UTI (urinary tract infection) 02/26/2018  . Vitamin D deficiency 05/31/2016    Past Surgical History:  Procedure Laterality Date  . BACK SURGERY     cervical and lumbar  . CATARACT EXTRACTION    . CHOLECYSTECTOMY    . FOOT SURGERY     5th toe   . SPINE SURGERY  lumbar and cervical fusions    Social History   Socioeconomic History  . Marital status: Divorced    Spouse name: Not on file  . Number of children: 3  . Years of education: 10  . Highest education level: Not on file  Occupational History  . Occupation: Retired.  Tobacco Use  . Smoking status: Former Smoker    Quit date: 08/07/1967    Years since quitting: 52.4  . Smokeless tobacco: Never Used  Vaping Use  . Vaping Use: Never used  Substance and Sexual Activity  . Alcohol use: No  . Drug use: No  . Sexual activity: Not Currently  Other Topics Concern  . Not on file  Social History Narrative   Wheelchair bound.Has not walked in many years, at least five years.  Lives alone. Has 2 aides that come in to help him.   Lives alone.    Retired.   Eats all food groups.    Has 3 children but does not have contact with them.   Divorced.   Social Determinants of Health    Financial Resource Strain: Not on file  Food Insecurity: Not on file  Transportation Needs: No Transportation Needs  . Lack of Transportation (Medical): No  . Lack of Transportation (Non-Medical): No  Physical Activity: Inactive  . Days of Exercise per Week: 0 days  . Minutes of Exercise per Session: 0 min  Stress: No Stress Concern Present  . Feeling of Stress : Not at all  Social Connections: Socially Isolated  . Frequency of Communication with Friends and Family: Once a week  . Frequency of Social Gatherings with Friends and Family: Once a week  . Attends Religious Services: More than 4 times per year  . Active Member of Clubs or Organizations: No  . Attends Archivist Meetings: Never  . Marital Status: Divorced  Human resources officer Violence: Not on file    Family History  Family history unknown: Yes    Anti-infectives: Anti-infectives (From admission, onward)   None      Current Outpatient Medications  Medication Sig Dispense Refill  . amLODipine (NORVASC) 10 MG tablet Take 1 tablet (10 mg total) by mouth daily. 30 tablet 0  . aspirin 81 MG chewable tablet Chew 1 tablet (81 mg total) by mouth daily with breakfast. 30 tablet 3  . atorvastatin (LIPITOR) 10 MG tablet Take 1 tablet (10 mg total) by mouth every evening. 30 tablet 5  . finasteride (PROSCAR) 5 MG tablet Take 1 tablet (5 mg total) by mouth daily. 90 tablet 3  . gabapentin (NEURONTIN) 100 MG capsule Take 1 capsule (100 mg total) by mouth 3 (three) times daily as needed (for ankle pain). 90 capsule 3  . LANTUS SOLOSTAR 100 UNIT/ML Solostar Pen Inject 20 Units into the skin daily. 15 mL 3  . lisinopril (ZESTRIL) 40 MG tablet TAKE (1) TABLET BY MOUTH ONCE DAILY. 30 tablet 0  . memantine (NAMENDA) 10 MG tablet TAKE (1) TABLET BY MOUTH TWICE DAILY. 60 tablet 0  . metoprolol succinate (TOPROL-XL) 25 MG 24 hr tablet TAKE (1) TABLET BY MOUTH ONCE DAILY. 30 tablet 0  . meloxicam (MOBIC) 7.5 MG tablet Take 1  tablet (7.5 mg total) by mouth daily. (Patient not taking: Reported on 01/15/2020) 30 tablet 5  . SURE COMFORT PEN NEEDLES 31G X 5 MM MISC     . tamsulosin (FLOMAX) 0.4 MG CAPS capsule Take 1 capsule (0.4 mg total) by mouth daily after breakfast. 90 capsule  3  . vitamin C (VITAMIN C) 500 MG tablet Take 1 tablet (500 mg total) by mouth daily. (Patient not taking: Reported on 01/15/2020)    . zinc sulfate 220 (50 Zn) MG capsule Take 1 capsule (220 mg total) by mouth daily. (Patient not taking: Reported on 01/15/2020)     No current facility-administered medications for this visit.     Objective: Vital signs in last 24 hours: BP 137/85   Pulse 65   Temp 97.8 F (36.6 C)   Ht 5\' 11"  (1.803 m)   Wt 225 lb (102.1 kg)   BMI 31.38 kg/m   Intake/Output from previous day: No intake/output data recorded. Intake/Output this shift: @IOTHISSHIFT @   Physical Exam Constitutional:      Appearance: Normal appearance.     Comments: Able to stand with assistance.   Neurological:     Mental Status: He is alert.     Lab Results:  Results for orders placed or performed in visit on 01/15/20 (from the past 24 hour(s))  Urinalysis, Routine w reflex microscopic     Status: None   Collection Time: 01/15/20  1:56 PM  Result Value Ref Range   Specific Gravity, UA CANCELED    pH, UA CANCELED    Protein,UA CANCELED    Glucose, UA CANCELED    Ketones, UA CANCELED    Microscopic Examination See below:    Narrative   Performed at:  [facility] [address], [place], [place]  [id number] Lab Director: Mina Marble MT, Phone:  [phone]  Microscopic Examination     Status: Abnormal   Collection Time: 01/15/20  1:56 PM   Urine  Result Value Ref Range   WBC, UA >30 (A) 0 - 5 /hpf   RBC >30 (A) 0 - 2 /hpf   Epithelial Cells (non renal) None seen 0 - 10 /hpf   Renal Epithel, UA None seen None seen /hpf   Bacteria, UA Many (A) None seen/Few   Narrative   Performed at:  [facility] [address] [place], [place]  [id number] Lab Director: Lomas, Phone:  [phone]    BMET No results for input(s): NA, K, CL, CO2, GLUCOSE, BUN, CREATININE, CALCIUM in the last 72 hours. PT/INR No results for input(s): LABPROT, INR in the last 72 hours. ABG No results for input(s): PHART, HCO3 in the last 72 hours.  Invalid input(s): PCO2, PO2  Studies/Results: No results found.   Assessment/Plan: BPH with BOO and incomplete emptying. He will remain on tamsulosin and finasteride.  Chronic cystitis.  His urine remains purulent today.  Urine culture sent.  I will adjust antibiotics as needed.     Meds ordered this encounter  Medications  . tamsulosin (FLOMAX) 0.4 MG CAPS capsule    Sig: Take 1 capsule (0.4 mg total) by mouth daily after breakfast.    Dispense:  90 capsule    Refill:  3     Orders Placed This Encounter  Procedures  . Urine Culture  . Microscopic Examination  . Urinalysis, Routine w reflex microscopic  . Bladder Scan (Post Void Residual) in office     Return in about 3 months (around 04/14/2020) for PVR.    CC: Cherly Beach NP    Irine Seal 01/16/2020 [phone]

## 2020-01-15 ENCOUNTER — Encounter: Payer: Self-pay | Admitting: Urology

## 2020-01-15 ENCOUNTER — Ambulatory Visit (INDEPENDENT_AMBULATORY_CARE_PROVIDER_SITE_OTHER): Payer: Medicare Other | Admitting: Urology

## 2020-01-15 ENCOUNTER — Other Ambulatory Visit: Payer: Self-pay

## 2020-01-15 VITALS — BP 137/85 | HR 65 | Temp 97.8°F | Ht 71.0 in | Wt 225.0 lb

## 2020-01-15 DIAGNOSIS — R339 Retention of urine, unspecified: Secondary | ICD-10-CM

## 2020-01-15 DIAGNOSIS — N3020 Other chronic cystitis without hematuria: Secondary | ICD-10-CM | POA: Diagnosis not present

## 2020-01-15 DIAGNOSIS — N138 Other obstructive and reflux uropathy: Secondary | ICD-10-CM | POA: Diagnosis not present

## 2020-01-15 DIAGNOSIS — R3912 Poor urinary stream: Secondary | ICD-10-CM | POA: Diagnosis not present

## 2020-01-15 DIAGNOSIS — N401 Enlarged prostate with lower urinary tract symptoms: Secondary | ICD-10-CM

## 2020-01-15 LAB — MICROSCOPIC EXAMINATION
Epithelial Cells (non renal): NONE SEEN /hpf (ref 0–10)
RBC, Urine: >30 /hpf — AB (ref 0–2)
Renal Epithel, UA: NONE SEEN /hpf
WBC, UA: >30 /hpf — AB (ref 0–5)

## 2020-01-15 LAB — URINALYSIS, ROUTINE W REFLEX MICROSCOPIC

## 2020-01-15 MED ORDER — TAMSULOSIN HCL 0.4 MG PO CAPS: 0.4000 mg | ORAL_CAPSULE | Freq: Every day | ORAL | 3 refills | Status: DC

## 2020-01-15 NOTE — Progress Notes (Signed)
Urological Symptom Review  Patient is experiencing the following symptoms: Hard to postpone urination Burning/pain with urination Leakage of urine Trouble starting stream Urinary tract infection Weak stream   Review of Systems  Gastrointestinal (upper)  : Negative for upper GI symptoms  Gastrointestinal (lower) : Constipation  Constitutional : Negative for symptoms  Skin: Negative for skin symptoms  Eyes: Blurred vision  Ear/Nose/Throat : Negative for Ear/Nose/Throat symptoms  Hematologic/Lymphatic: Negative for Hematologic/Lymphatic symptoms  Cardiovascular : Leg swelling  Respiratory : Shortness of breath  Endocrine: Negative for endocrine symptoms  Musculoskeletal: Negative for musculoskeletal symptoms  Neurological: Dizziness  Psychologic: Negative for psychiatric symptoms

## 2020-01-19 LAB — URINE CULTURE

## 2020-01-20 ENCOUNTER — Telehealth: Payer: Self-pay

## 2020-01-20 NOTE — Telephone Encounter (Signed)
Attempted to call pt to notify.

## 2020-01-26 ENCOUNTER — Other Ambulatory Visit: Payer: Self-pay | Admitting: Family Medicine

## 2020-01-27 ENCOUNTER — Telehealth: Payer: Self-pay

## 2020-01-27 NOTE — Telephone Encounter (Signed)
Attempted to call both numbers listed . No answer at first. 2nd was wrong number. It was a company's number.

## 2020-02-04 ENCOUNTER — Encounter (HOSPITAL_COMMUNITY): Payer: Self-pay

## 2020-02-04 ENCOUNTER — Emergency Department (HOSPITAL_COMMUNITY)
Admission: EM | Admit: 2020-02-04 | Discharge: 2020-02-05 | Disposition: A | Discharge status: Home / Self Care | Payer: Medicare Other | Attending: Emergency Medicine | Admitting: Emergency Medicine

## 2020-02-04 ENCOUNTER — Emergency Department (HOSPITAL_COMMUNITY): Payer: Medicare Other

## 2020-02-04 DIAGNOSIS — N390 Urinary tract infection, site not specified: Secondary | ICD-10-CM | POA: Diagnosis not present

## 2020-02-04 DIAGNOSIS — J9811 Atelectasis: Secondary | ICD-10-CM | POA: Insufficient documentation

## 2020-02-04 DIAGNOSIS — R059 Cough, unspecified: Secondary | ICD-10-CM | POA: Diagnosis not present

## 2020-02-04 DIAGNOSIS — R5383 Other fatigue: Secondary | ICD-10-CM | POA: Insufficient documentation

## 2020-02-04 DIAGNOSIS — Z20822 Contact with and (suspected) exposure to covid-19: Secondary | ICD-10-CM | POA: Diagnosis not present

## 2020-02-04 DIAGNOSIS — R001 Bradycardia, unspecified: Secondary | ICD-10-CM

## 2020-02-04 DIAGNOSIS — I10 Essential (primary) hypertension: Secondary | ICD-10-CM | POA: Diagnosis not present

## 2020-02-04 DIAGNOSIS — U071 COVID-19: Secondary | ICD-10-CM | POA: Diagnosis not present

## 2020-02-04 DIAGNOSIS — R29810 Facial weakness: Secondary | ICD-10-CM | POA: Insufficient documentation

## 2020-02-04 DIAGNOSIS — N3000 Acute cystitis without hematuria: Secondary | ICD-10-CM

## 2020-02-04 DIAGNOSIS — R410 Disorientation, unspecified: Secondary | ICD-10-CM | POA: Insufficient documentation

## 2020-02-04 DIAGNOSIS — L8941 Pressure ulcer of contiguous site of back, buttock and hip, stage 1: Secondary | ICD-10-CM | POA: Diagnosis not present

## 2020-02-04 DIAGNOSIS — E11622 Type 2 diabetes mellitus with other skin ulcer: Secondary | ICD-10-CM | POA: Diagnosis not present

## 2020-02-04 DIAGNOSIS — R531 Weakness: Secondary | ICD-10-CM | POA: Diagnosis not present

## 2020-02-04 LAB — COMPREHENSIVE METABOLIC PANEL
ALT: 13 U/L (ref 0–44)
AST: 12 U/L — ABNORMAL LOW (ref 15–41)
Albumin: 2.9 g/dL — ABNORMAL LOW (ref 3.5–5.0)
Alkaline Phosphatase: 109 U/L (ref 38–126)
Anion gap: 8 (ref 5–15)
BUN: 41 mg/dL — ABNORMAL HIGH (ref 8–23)
CO2: 23 mmol/L (ref 22–32)
Calcium: 8.8 mg/dL — ABNORMAL LOW (ref 8.9–10.3)
Chloride: 106 mmol/L (ref 98–111)
Creatinine, Ser: 1.97 mg/dL — ABNORMAL HIGH (ref 0.61–1.24)
GFR, Estimated: 32 mL/min — ABNORMAL LOW (ref >60)
Glucose, Bld: 139 mg/dL — ABNORMAL HIGH (ref 70–99)
Potassium: 5.3 mmol/L — ABNORMAL HIGH (ref 3.5–5.1)
Sodium: 137 mmol/L (ref 135–145)
Total Bilirubin: 0.5 mg/dL (ref 0.3–1.2)
Total Protein: 7.3 g/dL (ref 6.5–8.1)

## 2020-02-04 LAB — URINALYSIS, ROUTINE W REFLEX MICROSCOPIC
Bilirubin Urine: NEGATIVE
Glucose, UA: NEGATIVE mg/dL
Ketones, ur: NEGATIVE mg/dL
Nitrite: NEGATIVE
Protein, ur: 100 mg/dL — AB
Specific Gravity, Urine: 1.013 (ref 1.005–1.030)
WBC, UA: >50 WBC/hpf — ABNORMAL HIGH (ref 0–5)
pH: 7 (ref 5.0–8.0)

## 2020-02-04 LAB — CBC WITH DIFFERENTIAL/PLATELET
Abs Immature Granulocytes: 0.14 10*3/uL — ABNORMAL HIGH (ref 0.00–0.07)
Basophils Absolute: 0 10*3/uL (ref 0.0–0.1)
Basophils Relative: 0 %
Eosinophils Absolute: 0.1 10*3/uL (ref 0.0–0.5)
Eosinophils Relative: 1 %
HCT: 38 % — ABNORMAL LOW (ref 39.0–52.0)
Hemoglobin: 11.6 g/dL — ABNORMAL LOW (ref 13.0–17.0)
Immature Granulocytes: 1 %
Lymphocytes Relative: 10 %
Lymphs Abs: 1.7 10*3/uL (ref 0.7–4.0)
MCH: 30.1 pg (ref 26.0–34.0)
MCHC: 30.5 g/dL (ref 30.0–36.0)
MCV: 98.7 fL (ref 80.0–100.0)
Monocytes Absolute: 0.9 10*3/uL (ref 0.1–1.0)
Monocytes Relative: 5 %
Neutro Abs: 13.3 10*3/uL — ABNORMAL HIGH (ref 1.7–7.7)
Neutrophils Relative %: 83 %
Platelets: 262 10*3/uL (ref 150–400)
RBC: 3.85 MIL/uL — ABNORMAL LOW (ref 4.22–5.81)
RDW: 12.2 % (ref 11.5–15.5)
WBC: 16.1 10*3/uL — ABNORMAL HIGH (ref 4.0–10.5)
nRBC: 0 % (ref 0.0–0.2)

## 2020-02-04 LAB — CBG MONITORING, ED: Glucose-Capillary: 133 mg/dL — ABNORMAL HIGH (ref 70–99)

## 2020-02-04 MED ORDER — CEPHALEXIN 500 MG PO CAPS: 500.0000 mg | ORAL_CAPSULE | Freq: Four times a day (QID) | ORAL | 0 refills | Status: AC

## 2020-02-04 NOTE — ED Provider Notes (Signed)
Care handoff received from Marcene Brawn, PA-C at shift change please see previous providers note for full details of visit.  In short 87 year old male was sent in by a caregiver today for concern of altered mental status.  On arrival patient had no complaints, he has a history of previous CVA with right-sided weakness.  Patient's niece arrived and spoke with previous provider, per the niece patient's mental status and neurologic exam is baseline.  Patient has no complaints except for being cold and wanting to go home.  Plan of care at shift change is to follow-up on CBC, CMP and urinalysis.  Pending no emergent findings discharged home. Physical Exam  BP 130/62   Pulse 71   Temp 97.6 F (36.4 C) (Oral)   Resp 17   Ht 5\' 11"  (1.803 m)   Wt 102.1 kg   SpO2 96%   BMI 31.39 kg/m   Physical Exam Constitutional:      General: He is not in acute distress.    Appearance: Normal appearance. He is not ill-appearing or diaphoretic.  HENT:     Head: Normocephalic and atraumatic.     Right Ear: External ear normal.     Left Ear: External ear normal.     Nose: Nose normal.     Mouth/Throat:     Mouth: Mucous membranes are moist.     Pharynx: Oropharynx is clear.  Eyes:     Conjunctiva/sclera: Conjunctivae normal.     Pupils: Pupils are equal, round, and reactive to light.  Pulmonary:     Effort: Pulmonary effort is normal. No respiratory distress.  Abdominal:     General: Abdomen is flat.     Palpations: Abdomen is soft.     Tenderness: There is no abdominal tenderness. There is no guarding or rebound.  Musculoskeletal:        General: No tenderness.  Skin:    General: Skin is warm and dry.  Neurological:     Mental Status: He is alert.     Comments: Alert, oriented, answers questions appropriately Right-sided facial droop right-sided weakness which patient reports is baseline.     ED Course/Procedures     Procedures  MDM  I ordered, reviewed and interpreted labs which  include: CBC shows leukocytosis of 16.1 and anemia of 11.6. CMP shows baseline kidney function with creatinine 1.97 and BUN 41 no acute worsening.  No emergent elevation of LFTs.  Normal gap.  Mild hyperkalemia of 5.3. Glucose 133. Urine pending.  EKG: Shows Mobitz type I, reviewed by Dr. Eulis Foster.  I reevaluated the patient he is resting comfortably in bed no acute distress.  He is sleeping he easily arousable to voice, he has no complaints or concerns.  Reports that he is feeling well and would like to go home.  Heart rate noted to be low 40s, I obtained an EKG, shows Wenkebach, patient without chest pain shortness of breath or near syncope. - Care handoff given to Courtni Couture PA-C at shift change.  Plan of care is to follow-up on urinalysis, reassessment of patient.  Chest x-ray added given new leukocytosis.  If patient with UTI anticipate antibiotics and discharge.  Final disposition per oncoming team.    Note: Portions of this report may have been transcribed using voice recognition software. Every effort was made to ensure accuracy; however, inadvertent computerized transcription errors may still be present.       Gari Crown 02/04/20 2042    Daleen Bo, MD 02/04/20 [phone]

## 2020-02-04 NOTE — ED Provider Notes (Signed)
Care assumed from [facility], PA-C at shift change.  He received signout from [employer].  See her note for full H&P.  Briefly, patient was noted to have altered mental status.  Some family members disagreed and felt the patient was at his mental baseline.  Work-up was initiated to rule out infectious cause of symptoms.  Additional history obtained from caretaker at bedside.  He states patient has had some URI symptoms for the last several days including some congestion and a cough.    Physical Exam  BP (!) 138/44   Pulse (!) 48   Temp 97.6 F (36.4 C) (Oral)   Resp 17   Ht 5\' 11"  (1.803 m)   Wt 102.1 kg   SpO2 96%   BMI 31.39 kg/m   Physical Exam Constitutional:      General: He is not in acute distress.    Appearance: He is well-developed and well-nourished.  Eyes:     Conjunctiva/sclera: Conjunctivae normal.  Cardiovascular:     Rate and Rhythm: Normal rate.  Pulmonary:     Effort: Pulmonary effort is normal.  Skin:    General: Skin is warm and dry.  Neurological:     Mental Status: He is alert and oriented to person, place, and time.     ED Course/Procedures   Clinical Course as of 02/04/20 2304  Thu Feb 04, 2020  2054 Urinalysis, Routine w reflex microscopic Urine, Catheterized [EW]    Clinical Course User Index [EW] Daleen Bo, MD    Procedures  Results for orders placed or performed during the hospital encounter of 02/04/20  CBC with Differential/Platelet  Result Value Ref Range   WBC 16.1 (H) 4.0 - 10.5 K/uL   RBC 3.85 (L) 4.22 - 5.81 MIL/uL   Hemoglobin 11.6 (L) 13.0 - 17.0 g/dL   HCT 38.0 (L) 39.0 - 52.0 %   MCV 98.7 80.0 - 100.0 fL   MCH 30.1 26.0 - 34.0 pg   MCHC 30.5 30.0 - 36.0 g/dL   RDW 12.2 11.5 - 15.5 %   Platelets 262 150 - 400 K/uL   nRBC 0.0 0.0 - 0.2 %   Neutrophils Relative % 83 %   Neutro Abs 13.3 (H) 1.7 - 7.7 K/uL   Lymphocytes Relative 10 %   Lymphs Abs 1.7 0.7 - 4.0 K/uL   Monocytes Relative 5 %   Monocytes Absolute  0.9 0.1 - 1.0 K/uL   Eosinophils Relative 1 %   Eosinophils Absolute 0.1 0.0 - 0.5 K/uL   Basophils Relative 0 %   Basophils Absolute 0.0 0.0 - 0.1 K/uL   Immature Granulocytes 1 %   Abs Immature Granulocytes 0.14 (H) 0.00 - 0.07 K/uL  Comprehensive metabolic panel  Result Value Ref Range   Sodium 137 135 - 145 mmol/L   Potassium 5.3 (H) 3.5 - 5.1 mmol/L   Chloride 106 98 - 111 mmol/L   CO2 23 22 - 32 mmol/L   Glucose, Bld 139 (H) 70 - 99 mg/dL   BUN 41 (H) 8 - 23 mg/dL   Creatinine, Ser 1.97 (H) 0.61 - 1.24 mg/dL   Calcium 8.8 (L) 8.9 - 10.3 mg/dL   Total Protein 7.3 6.5 - 8.1 g/dL   Albumin 2.9 (L) 3.5 - 5.0 g/dL   AST 12 (L) 15 - 41 U/L   ALT 13 0 - 44 U/L   Alkaline Phosphatase 109 38 - 126 U/L   Total Bilirubin 0.5 0.3 - 1.2 mg/dL   GFR,  Estimated 32 (L) >60 mL/min   Anion gap 8 5 - 15  Urinalysis, Routine w reflex microscopic Urine, Catheterized  Result Value Ref Range   Color, Urine YELLOW YELLOW   APPearance CLOUDY (A) CLEAR   Specific Gravity, Urine 1.013 1.005 - 1.030   pH 7.0 5.0 - 8.0   Glucose, UA NEGATIVE NEGATIVE mg/dL   Hgb urine dipstick MODERATE (A) NEGATIVE   Bilirubin Urine NEGATIVE NEGATIVE   Ketones, ur NEGATIVE NEGATIVE mg/dL   Protein, ur 100 (A) NEGATIVE mg/dL   Nitrite NEGATIVE NEGATIVE   Leukocytes,Ua LARGE (A) NEGATIVE   RBC / HPF 11-20 0 - 5 RBC/hpf   WBC, UA >50 (H) 0 - 5 WBC/hpf   Bacteria, UA RARE (A) NONE SEEN   Squamous Epithelial / LPF 0-5 0 - 5   WBC Clumps PRESENT    Non Squamous Epithelial 0-5 (A) NONE SEEN  CBG monitoring, ED  Result Value Ref Range   Glucose-Capillary 133 (H) 70 - 99 mg/dL   DG Chest Portable 1 View  Result Date: 02/04/2020 CLINICAL DATA:  Weakness and cough x2 days. History of COVID positive on November 03, 2018. EXAM: PORTABLE CHEST 1 VIEW COMPARISON:  March 23, 2019 FINDINGS: Very mild atelectasis is noted within the right lung base. There is no evidence of a pleural effusion or pneumothorax. The heart size  and mediastinal contours are within normal limits. Degenerative changes seen within the mid and lower thoracic spine. IMPRESSION: Very mild right basilar atelectasis. Electronically Signed   By: Virgina Norfolk M.D.   On: 02/04/2020 21:08    EKG Interpretation  Date/Time:  Thursday February 04 2020 19:53:17 EST Ventricular Rate:  40 PR Interval:    QRS Duration: 103 QT Interval:  473 QTC Calculation: 386 R Axis:   2 Text Interpretation: Sinus rhythm Second deg AVB, Mobitz I (Wenckebach) Low voltage, precordial leads Abnormal R-wave progression, early transition Since last tracing Second degree heart block is new Otherwise no significant change Confirmed by Daleen Bo [phone]) on 02/04/2020 9:00:08 PM        MDM   Briefly, patient here for evaluation of altered mental status.  Prior provider started infectious work-up and felt that if it was reassuring patient can be discharged home.  CBC showed a leukocytosis of 16, mild anemia which appears to be chronic CMP showed a elevated potassium at 5.3, creatinine is elevated but again appears to be baseline. UA showed hematuria, large leukocytes, 11-20 RBCs, greater than 50 WBCs and rare bacteria.  Less likely consistent with UTI and will treat with Keflex.  EKG Sinus rhythm Second deg AVB, Mobitz I (Wenckebach) Low voltage, precordial leads Abnormal R-wave progression, early transition Since last tracing Second degree heart block is new Otherwise no significant change   -Has had some low heart rates here.  Is on metoprolol.  Have advised him to discontinue this.  Chest x-ray showed some atelectasis.  He has had a cough so I did send off a Covid test which will result tomorrow.  He is not hypoxic and does not appear to require admission  His work-up today is generally reassuring though he does have a UTI.  We will treat this with Keflex.  Again, as noted above will need to discontinue his metoprolol and he will need to follow-up about his  pressure ulcer and also Covid test.  Have advised on strict return precaution.  Patient and caretaker at bedside voiced understanding of the plan and reasons to return.  All  questions answered.  Patient stable for discharge      Bishop Dublin 02/04/20 2304    Daleen Bo, MD 02/04/20 [phone]

## 2020-02-04 NOTE — ED Triage Notes (Signed)
Pt. Complains of weakness for 2 days.

## 2020-02-04 NOTE — ED Provider Notes (Signed)
[facility] Provider Note   CSN: [id number] Arrival date & time: 02/04/20  1531     History Chief Complaint  Patient presents with  . Weakness    Pt. Complains of weakness x2 days.    Mike Briggs is a 87 y.o. male.  The history is provided by the patient. No language interpreter was used.  Weakness Severity:  Moderate Onset quality:  Gradual Duration:  2 hours Timing:  Constant Progression:  Worsening Chronicity:  New Relieved by:  Nothing Associated symptoms: no foul-smelling urine, no nausea and no vomiting    Pt denies any complaints except being cold.  Pt states he did not want to be up here.  He wanted to stay home.   Pt's niece is here and reports pt has a car giver who reported pt was sleepy and not his usual self today.  Pt has had a cva in the past with right sided weakness. Niece reports pt seems normal to her    Past Medical History:  Diagnosis Date  . Acute cystitis 12/17/2018  . Acute encephalopathy 11/14/2018  . ANKLE, ARTHRITIS, DEGEN./OSTEO 12/16/2008   Qualifier: Diagnosis of  By: Aline Brochure MD, Dorothyann Peng    . Arthritis   . Cataract   . Chronic kidney disease   . COVID-19   . COVID-19 virus detected 11/03/2018  . COVID-19 virus infection 11/03/2018  . Dehydration 11/02/2018  . Depression   . Diabetes mellitus   . Gram-positive bacteremia 11/03/2018  . Hyperlipidemia   . Hypertension   . Hypomagnesemia 03/24/2019  . Klebsiella UTI  03/25/2019  . Pain in finger of right hand 05/27/2019  . Personal history of gout 05/22/2016  . Rhabdomyolysis 11/02/2018  . Stroke ([place])   . Swelling of both hands 05/27/2019  . Syncope 02/25/2018  . TIA (transient ischemic attack) 11/14/2018  . UTI (urinary tract infection) 02/26/2018  . Vitamin D deficiency 05/31/2016    Patient Active Problem List   Diagnosis Date Noted  . Polyarthritis 12/10/2019  . Hydrocele 05/27/2019  . Mobitz type 1 second degree atrioventricular block   . Sacral decubitus  ulcer, stage III ([place]) 11/11/2018  . Pressure injury of skin 11/08/2018  . Essential hypertension 05/22/2016  . Hemiparesis affecting right side as late effect of cerebrovascular accident (CVA) ([place]) 05/22/2016  . BPH (benign prostatic hyperplasia) 05/22/2016  . Chronic constipation 05/22/2016  . HLD (hyperlipidemia) 05/22/2016  . Pseudophakia 12/06/2015  . Vascular dementia ([place]) 10/23/2012  . Chronic kidney disease, stage III (moderate) ([place]) 10/20/2012  . Type 2 diabetes mellitus with diabetic chronic kidney disease ([place]) 12/16/2008    Past Surgical History:  Procedure Laterality Date  . BACK SURGERY     cervical and lumbar  . CATARACT EXTRACTION    . CHOLECYSTECTOMY    . FOOT SURGERY     5th toe   . SPINE SURGERY     lumbar and cervical fusions       Family History  Family history unknown: Yes    Social History   Tobacco Use  . Smoking status: Former Smoker    Quit date: 08/07/1967    Years since quitting: 52.5  . Smokeless tobacco: Never Used  Vaping Use  . Vaping Use: Never used  Substance Use Topics  . Alcohol use: No  . Drug use: No    Home Medications Prior to Admission medications   Medication Sig Start Date End Date Taking? Authorizing Provider  amLODipine (NORVASC) 10 MG tablet TAKE ONE  TABLET BY MOUTH ONCE DAILY 01/26/20  Yes Fayrene Helper, MD  aspirin 81 MG chewable tablet Chew 1 tablet (81 mg total) by mouth daily with breakfast. 12/04/19  Yes Perlie Mayo, NP  atorvastatin (LIPITOR) 10 MG tablet Take 1 tablet (10 mg total) by mouth every evening. 12/04/19  Yes Perlie Mayo, NP  finasteride (PROSCAR) 5 MG tablet Take 1 tablet (5 mg total) by mouth daily. 12/04/19  Yes Perlie Mayo, NP  gabapentin (NEURONTIN) 100 MG capsule Take 1 capsule (100 mg total) by mouth 3 (three) times daily as needed (for ankle pain). 12/10/19  Yes Noreene Larsson, NP  LANTUS SOLOSTAR 100 UNIT/ML Solostar Pen Inject 20 Units into the skin daily. 12/04/19  Yes Perlie Mayo, NP  lisinopril (ZESTRIL) 40 MG tablet TAKE (1) TABLET BY MOUTH ONCE DAILY. Patient taking differently: Take 40 mg by mouth daily. 01/26/20  Yes Fayrene Helper, MD  meloxicam (MOBIC) 7.5 MG tablet Take 1 tablet (7.5 mg total) by mouth daily. 12/04/19  Yes Perlie Mayo, NP  memantine (NAMENDA) 10 MG tablet TAKE (1) TABLET BY MOUTH TWICE DAILY. Patient taking differently: Take 10 mg by mouth 2 (two) times daily. 01/26/20  Yes Fayrene Helper, MD  metoprolol succinate (TOPROL-XL) 25 MG 24 hr tablet TAKE (1) TABLET BY MOUTH ONCE DAILY. Patient taking differently: Take 25 mg by mouth daily. 01/26/20  Yes Fayrene Helper, MD  SURE COMFORT PEN NEEDLES 31G X 5 MM [place]  11/11/19  Yes [provider]  tamsulosin (FLOMAX) 0.4 MG CAPS capsule Take 1 capsule (0.4 mg total) by mouth daily after breakfast. 01/15/20  Yes Irine Seal, MD  vitamin C (VITAMIN C) 500 MG tablet Take 1 tablet (500 mg total) by mouth daily. 11/19/18   Eugenie Filler, MD  zinc sulfate 220 (50 Zn) MG capsule Take 1 capsule (220 mg total) by mouth daily. 03/25/19   Roxan Hockey, MD    Allergies    Patient has no known allergies.  Review of Systems   Review of Systems  Gastrointestinal: Negative for nausea and vomiting.  Neurological: Positive for weakness.  All other systems reviewed and are negative.   Physical Exam Updated Vital Signs BP (!) 134/51   Pulse 71   Temp 97.6 F (36.4 C) (Oral)   Resp 13   Ht 5\' 11"  (1.803 m)   Wt 102.1 kg   SpO2 97%   BMI 31.39 kg/m   Physical Exam Vitals and nursing note reviewed.  Constitutional:      Appearance: He is well-developed and well-nourished.  HENT:     Head: Normocephalic and atraumatic.  Eyes:     Conjunctiva/sclera: Conjunctivae normal.  Cardiovascular:     Rate and Rhythm: Normal rate and regular rhythm.     Heart sounds: No murmur heard.   Pulmonary:     Effort: Pulmonary effort is normal. No respiratory distress.      Breath sounds: Normal breath sounds.  Abdominal:     Palpations: Abdomen is soft.     Tenderness: There is no abdominal tenderness.  Musculoskeletal:        General: No edema.     Cervical back: Neck supple.  Skin:    General: Skin is warm and dry.  Neurological:     Mental Status: He is alert. Mental status is at baseline.  Psychiatric:        Mood and Affect: Mood and affect and mood normal.  ED Results / Procedures / Treatments   Labs (all labs ordered are listed, but only abnormal results are displayed) Labs Reviewed  CBC WITH DIFFERENTIAL/PLATELET  COMPREHENSIVE METABOLIC PANEL  URINALYSIS, ROUTINE W REFLEX MICROSCOPIC    EKG None  Radiology No results found.  Procedures Procedures (including critical care time)  Medications Ordered in ED Medications - No data to display  ED Course  I have reviewed the triage vital signs and the nursing notes.  Pertinent labs & imaging results that were available during my care of the patient were reviewed by me and considered in my medical decision making (see chart for details).    MDM Rules/Calculators/A&P                          MDM: labs pending. Pt requesting to eat and drink.   Pt's care turned over to [facility]  Final Clinical Impression(s) / ED Diagnoses Final diagnoses:  Weakness    Rx / DC Orders ED Discharge Orders    None       Sidney Ace 02/04/20 Yevette Edwards    Daleen Bo, MD 02/04/20 [phone]

## 2020-02-04 NOTE — ED Notes (Signed)
Pt. Has a stage 2 pressure ulcer on their bottom.

## 2020-02-04 NOTE — Discharge Instructions (Addendum)
The patient will need to discontinue his metoprolol as his heart rate was low in the emergency department.   He was also found to have a urinary tract infection and was started on antibiotics for this.   Additionally he was found to have a pressure ulcer. This will need to be followed by his primary care provider for wound care, etc.  Lastly, a COVID test was sent on this patient and will result tomorrow. If the test is positive, he will be contacted and made aware.  At this time there does not appear to be the presence of an emergent medical condition, however there is always the potential for conditions to change. Please read and follow the below instructions.  Please return to the Emergency Department immediately for any new or worsening symptoms. Please be sure to follow up with your Primary Care Provider within one week regarding your visit today; please call their office to schedule an appointment even if you are feeling better for a follow-up visit. Stop taking metoprolol as this is likely causing your heart rate to be slow and causing your fatigue.   Please read the additional information packets attached to your discharge summary.  Do not take your medicine if  develop an itchy rash, swelling in your mouth or lips, or difficulty breathing; call 911 and seek immediate emergency medical attention if this occurs.  You may review your lab tests and imaging results in their entirety on your MyChart account.  Please discuss all results of fully with your primary care provider and other specialist at your follow-up visit.  Note: Portions of this text may have been transcribed using voice recognition software. Every effort was made to ensure accuracy; however, inadvertent computerized transcription errors may still be present.

## 2020-02-04 NOTE — ED Notes (Signed)
Peanut butter and crackers provided to pt.

## 2020-02-04 NOTE — ED Notes (Signed)
[facility] son phone number

## 2020-02-04 NOTE — ED Notes (Signed)
Patient to be discharged via [facility].  Spoke to patient's son Marijo File who states he will meet EMS at the residence if we call when patient is en route to home.

## 2020-02-04 NOTE — ED Provider Notes (Signed)
  Face-to-face evaluation   History: Patient here for evaluation of general fatigue causing him to have less energy than usual and more difficulty getting around his home.  He has an aide, numerous hours each day, but is at home alone at times.  He is able to get around his house on his hover chair, and able to get in and out of that from a chair or bed.  He does not typically stool in the toilet, because of chronic disability.  There have been no other recent illnesses or problems.  Physical exam: Elderly male who is alert and cooperative but confused.  No dysarthria or aphasia.  Poor historian.  No respiratory distress.  Left buttocks with superficial pressure sore consistent with stage I  MDM-fatigue likely secondary to relative bradycardia with Wenckebach.  Patient is on metoprolol.  Apparently this is for high blood pressure.  This will need to be stopped with close outpatient monitoring.  Urinalysis abnormal.  Doubt sepsis.  Patient stable for discharge with treatment of UTI, urine culture, and close follow-up.  Medical screening examination/treatment/procedure(s) were conducted as a shared visit with non-physician practitioner(s) and myself.  I personally evaluated the patient during the encounter    Daleen Bo, MD 02/04/20 2330

## 2020-02-05 DIAGNOSIS — R404 Transient alteration of awareness: Secondary | ICD-10-CM | POA: Diagnosis not present

## 2020-02-05 DIAGNOSIS — R531 Weakness: Secondary | ICD-10-CM | POA: Diagnosis not present

## 2020-02-05 DIAGNOSIS — Z7401 Bed confinement status: Secondary | ICD-10-CM | POA: Diagnosis not present

## 2020-02-05 DIAGNOSIS — R29898 Other symptoms and signs involving the musculoskeletal system: Secondary | ICD-10-CM | POA: Diagnosis not present

## 2020-02-05 DIAGNOSIS — I959 Hypotension, unspecified: Secondary | ICD-10-CM | POA: Diagnosis not present

## 2020-02-05 LAB — SARS CORONAVIRUS 2 (TAT 6-24 HRS): SARS Coronavirus 2: NEGATIVE

## 2020-02-05 NOTE — ED Notes (Signed)
Patient still awaiting EMS transport.  Moved to hallway bed.

## 2020-02-25 NOTE — Telephone Encounter (Signed)
Have attempted to call pt several times.

## 2020-02-25 NOTE — Telephone Encounter (Signed)
Unable to contact pt. Attempted several times.

## 2020-02-26 ENCOUNTER — Other Ambulatory Visit: Payer: Self-pay | Admitting: Family Medicine

## 2020-03-08 ENCOUNTER — Other Ambulatory Visit: Payer: Self-pay | Admitting: *Deleted

## 2020-03-08 ENCOUNTER — Ambulatory Visit (INDEPENDENT_AMBULATORY_CARE_PROVIDER_SITE_OTHER): Payer: Medicare Other | Admitting: Internal Medicine

## 2020-03-08 ENCOUNTER — Other Ambulatory Visit: Payer: Self-pay

## 2020-03-08 ENCOUNTER — Encounter: Payer: Self-pay | Admitting: Internal Medicine

## 2020-03-08 VITALS — BP 148/80 | HR 88 | Temp 98.7°F | Resp 18 | Ht 71.0 in

## 2020-03-08 DIAGNOSIS — N183 Chronic kidney disease, stage 3 unspecified: Secondary | ICD-10-CM | POA: Diagnosis not present

## 2020-03-08 DIAGNOSIS — Z0100 Encounter for examination of eyes and vision without abnormal findings: Secondary | ICD-10-CM

## 2020-03-08 DIAGNOSIS — I10 Essential (primary) hypertension: Secondary | ICD-10-CM

## 2020-03-08 DIAGNOSIS — R82998 Other abnormal findings in urine: Secondary | ICD-10-CM | POA: Diagnosis not present

## 2020-03-08 DIAGNOSIS — I441 Atrioventricular block, second degree: Secondary | ICD-10-CM

## 2020-03-08 DIAGNOSIS — E1122 Type 2 diabetes mellitus with diabetic chronic kidney disease: Secondary | ICD-10-CM | POA: Diagnosis not present

## 2020-03-08 DIAGNOSIS — E119 Type 2 diabetes mellitus without complications: Secondary | ICD-10-CM

## 2020-03-08 DIAGNOSIS — N400 Enlarged prostate without lower urinary tract symptoms: Secondary | ICD-10-CM

## 2020-03-08 DIAGNOSIS — N390 Urinary tract infection, site not specified: Secondary | ICD-10-CM

## 2020-03-08 LAB — POCT GLYCOSYLATED HEMOGLOBIN (HGB A1C)
HbA1c, POC (controlled diabetic range): 6.8 % (ref 0.0–7.0)
Hemoglobin A1C: 6.8 % — AB (ref 4.0–5.6)

## 2020-03-08 MED ORDER — BLOOD GLUCOSE METER KIT: PACK | 0 refills | Status: DC

## 2020-03-08 NOTE — Patient Instructions (Signed)
Please bring your medications for review in the next visit.  Please decrease Lantus to 18 U daily. Please check his blood glucose at home and bring the log in the next visit.  If blood glucose is less than 70, please give him a cracker.  Please remove Metoprolol from his medications at home. Please let Pharmacy to remove it from the list as well.

## 2020-03-08 NOTE — Assessment & Plan Note (Signed)
Lab Results  Component Value Date   HGBA1C 6.8 (A) 03/08/2020   HGBA1C 6.8 03/08/2020   Would decrease Lantus to 18 U QD as patient has c/o fatigue Goal HbA1C for him would be between 7.0-8.0

## 2020-03-08 NOTE — Assessment & Plan Note (Addendum)
EKG from ER reviewed EKG in the office today showed HR 64 with Mobitz type 1 second degree AV block. Skipped beat after 3 beats.  Discontinued Metoprolol from med rec Advised caretaker to remove it from home medications.

## 2020-03-08 NOTE — Progress Notes (Signed)
Established Patient Office Visit  Subjective:  Patient ID: Mike Briggs, male    DOB: 10/28/1928  Age: 87 y.o. MRN: [id number]  CC:  Chief Complaint  Patient presents with  . Follow-up    3 month follow up     HPI Mike Briggs is a 87 year old male with PMH of HTN, DM with CKD stage 3, recently diagnosed Mobitz type 1 second degree AV block, dementia and BPH who presents for follow up of his chronic medical conditions.  Caregiver is present during the visit. He reports that patient has been tired most of the day. His HbA1C is 6.8. He has been on Lantus 20 U QD. Does not check blood glucose at home. His Metoprolol was discontinued from ER due to AV block, but caregiver reports that the pharmacy is still sending it. Patient is noncompliant with the medications. He denies any chest pain, dyspnea or palpitations.  He has been having dark urine. Denies any dysuria or hematuria. Denies any fever, chills nausea, vomiting or lower abdominal pain/discomfort. He has been on Flomax and Finasteride for BPH.  Past Medical History:  Diagnosis Date  . Acute cystitis 12/17/2018  . Acute encephalopathy 11/14/2018  . ANKLE, ARTHRITIS, DEGEN./OSTEO 12/16/2008   Qualifier: Diagnosis of  By: Aline Brochure MD, Dorothyann Peng    . Arthritis   . Cataract   . Chronic kidney disease   . COVID-19   . COVID-19 virus detected 11/03/2018  . COVID-19 virus infection 11/03/2018  . Dehydration 11/02/2018  . Depression   . Diabetes mellitus   . Gram-positive bacteremia 11/03/2018  . Hyperlipidemia   . Hypertension   . Hypomagnesemia 03/24/2019  . Klebsiella UTI  03/25/2019  . Pain in finger of right hand 05/27/2019  . Personal history of gout 05/22/2016  . Rhabdomyolysis 11/02/2018  . Stroke ([place])   . Swelling of both hands 05/27/2019  . Syncope 02/25/2018  . TIA (transient ischemic attack) 11/14/2018  . UTI (urinary tract infection) 02/26/2018  . Vitamin D deficiency 05/31/2016    Past Surgical History:  Procedure  Laterality Date  . BACK SURGERY     cervical and lumbar  . CATARACT EXTRACTION    . CHOLECYSTECTOMY    . FOOT SURGERY     5th toe   . SPINE SURGERY     lumbar and cervical fusions    Family History  Family history unknown: Yes    Social History   Socioeconomic History  . Marital status: Divorced    Spouse name: Not on file  . Number of children: 3  . Years of education: 10  . Highest education level: Not on file  Occupational History  . Occupation: Retired.  Tobacco Use  . Smoking status: Former Smoker    Quit date: 08/07/1967    Years since quitting: 52.6  . Smokeless tobacco: Never Used  Vaping Use  . Vaping Use: Never used  Substance and Sexual Activity  . Alcohol use: No  . Drug use: No  . Sexual activity: Not Currently  Other Topics Concern  . Not on file  Social History Narrative   Wheelchair bound.Has not walked in many years, at least five years.  Lives alone. Has 2 aides that come in to help him.   Lives alone.    Retired.   Eats all food groups.    Has 3 children but does not have contact with them.   Divorced.   Social Determinants of Health   Financial Resource Strain: Not on  file  Food Insecurity: Not on file  Transportation Needs: No Transportation Needs  . Lack of Transportation (Medical): No  . Lack of Transportation (Non-Medical): No  Physical Activity: Inactive  . Days of Exercise per Week: 0 days  . Minutes of Exercise per Session: 0 min  Stress: No Stress Concern Present  . Feeling of Stress : Not at all  Social Connections: Socially Isolated  . Frequency of Communication with Friends and Family: Once a week  . Frequency of Social Gatherings with Friends and Family: Once a week  . Attends Religious Services: More than 4 times per year  . Active Member of Clubs or Organizations: No  . Attends Archivist Meetings: Never  . Marital Status: Divorced  Human resources officer Violence: Not on file    Outpatient Medications Prior to  Visit  Medication Sig Dispense Refill  . amLODipine (NORVASC) 10 MG tablet TAKE ONE TABLET BY MOUTH ONCE DAILY 30 tablet 5  . aspirin 81 MG chewable tablet Chew 1 tablet (81 mg total) by mouth daily with breakfast. 30 tablet 3  . atorvastatin (LIPITOR) 10 MG tablet Take 1 tablet (10 mg total) by mouth every evening. 30 tablet 5  . finasteride (PROSCAR) 5 MG tablet Take 1 tablet (5 mg total) by mouth daily. 90 tablet 3  . gabapentin (NEURONTIN) 100 MG capsule Take 1 capsule (100 mg total) by mouth 3 (three) times daily as needed (for ankle pain). 90 capsule 3  . LANTUS SOLOSTAR 100 UNIT/ML Solostar Pen Inject 20 Units into the skin daily. (Patient taking differently: Inject 18 Units into the skin daily.) 15 mL 3  . lisinopril (ZESTRIL) 40 MG tablet TAKE (1) TABLET BY MOUTH ONCE DAILY. 30 tablet 5  . meloxicam (MOBIC) 7.5 MG tablet Take 1 tablet (7.5 mg total) by mouth daily. 30 tablet 5  . memantine (NAMENDA) 10 MG tablet TAKE (1) TABLET BY MOUTH TWICE DAILY. 60 tablet 5  . SURE COMFORT PEN NEEDLES 31G X 5 MM MISC     . tamsulosin (FLOMAX) 0.4 MG CAPS capsule Take 1 capsule (0.4 mg total) by mouth daily after breakfast. 90 capsule 3  . vitamin C (VITAMIN C) 500 MG tablet Take 1 tablet (500 mg total) by mouth daily.    Marland Kitchen zinc sulfate 220 (50 Zn) MG capsule Take 1 capsule (220 mg total) by mouth daily.    . metoprolol succinate (TOPROL-XL) 25 MG 24 hr tablet TAKE (1) TABLET BY MOUTH ONCE DAILY. 30 tablet 5   No facility-administered medications prior to visit.    No Known Allergies  ROS Review of Systems  Constitutional: Positive for fatigue. Negative for chills and fever.  HENT: Negative for congestion and sore throat.   Eyes: Negative for pain and discharge.  Respiratory: Negative for cough and shortness of breath.   Cardiovascular: Negative for chest pain and palpitations.  Gastrointestinal: Negative for constipation, diarrhea, nausea and vomiting.  Endocrine: Negative for polydipsia  and polyuria.  Genitourinary: Negative for dysuria and hematuria.  Musculoskeletal: Negative for neck pain and neck stiffness.  Skin: Negative for rash.  Neurological: Negative for dizziness, weakness, numbness and headaches.  Psychiatric/Behavioral: Negative for agitation and behavioral problems.      Objective:    Physical Exam Vitals reviewed.  Constitutional:      General: He is not in acute distress.    Appearance: He is not diaphoretic.  HENT:     Head: Normocephalic and atraumatic.     Nose: Nose normal.  Mouth/Throat:     Mouth: Mucous membranes are moist.  Eyes:     General: No scleral icterus.    Extraocular Movements: Extraocular movements intact.     Pupils: Pupils are equal, round, and reactive to light.  Cardiovascular:     Rate and Rhythm: Normal rate and regular rhythm.     Pulses: Normal pulses.     Heart sounds: Normal heart sounds. No murmur heard.   Pulmonary:     Breath sounds: Normal breath sounds. No wheezing or rales.  Abdominal:     Palpations: Abdomen is soft.     Tenderness: There is no abdominal tenderness.  Musculoskeletal:     Cervical back: Neck supple. No tenderness.     Right lower leg: No edema.     Left lower leg: No edema.  Skin:    General: Skin is warm.     Findings: No rash.  Neurological:     General: No focal deficit present.     Mental Status: He is alert and oriented to person, place, and time.     Sensory: No sensory deficit.     Motor: No weakness.  Psychiatric:        Mood and Affect: Mood normal.        Behavior: Behavior normal.     BP (!) 148/80 (BP Location: Right Arm, Patient Position: Sitting, Cuff Size: Normal)   Pulse 88   Temp 98.7 F (37.1 C) (Oral)   Resp 18   Ht '5\' 11"'$  (1.803 m)   SpO2 100%   BMI 31.39 kg/m  Wt Readings from Last 3 Encounters:  02/04/20 225 lb 1.4 oz (102.1 kg)  01/15/20 225 lb (102.1 kg)  12/10/19 225 lb (102.1 kg)     Health Maintenance Due  Topic Date Due  .  OPHTHALMOLOGY EXAM  12/25/2016  . FOOT EXAM  12/25/2017    There are no preventive care reminders to display for this patient.  Lab Results  Component Value Date   TSH 0.849 12/18/2018   Lab Results  Component Value Date   WBC 16.1 (H) 02/04/2020   HGB 11.6 (L) 02/04/2020   HCT 38.0 (L) 02/04/2020   MCV 98.7 02/04/2020   PLT 262 02/04/2020   Lab Results  Component Value Date   NA 137 02/04/2020   K 5.3 (H) 02/04/2020   CO2 23 02/04/2020   GLUCOSE 139 (H) 02/04/2020   BUN 41 (H) 02/04/2020   CREATININE 1.97 (H) 02/04/2020   BILITOT 0.5 02/04/2020   ALKPHOS 109 02/04/2020   AST 12 (L) 02/04/2020   ALT 13 02/04/2020   PROT 7.3 02/04/2020   ALBUMIN 2.9 (L) 02/04/2020   CALCIUM 8.8 (L) 02/04/2020   ANIONGAP 8 02/04/2020   Lab Results  Component Value Date   CHOL 146 11/14/2018   Lab Results  Component Value Date   HDL 28 (L) 11/14/2018   Lab Results  Component Value Date   LDLCALC 94 11/14/2018   Lab Results  Component Value Date   TRIG 120 11/14/2018   Lab Results  Component Value Date   CHOLHDL 5.2 11/14/2018   Lab Results  Component Value Date   HGBA1C 6.8 (A) 03/08/2020   HGBA1C 6.8 03/08/2020      Assessment & Plan:   Problem List Items Addressed This Visit      Cardiovascular and Mediastinum   Essential hypertension    BP Readings from Last 1 Encounters:  03/08/20 (!) 148/80   Elevated Unsure of  current medication intake Metoprolol was recently discontinued due Mobitz type 1 second degree AV block in the ER Caretaker reports that he is still getting it from Pharmacy, but patient does not take medicines at times. Counseled for compliance with the medications Advised low salt diet      Mobitz type 1 second degree atrioventricular block    EKG from ER reviewed EKG in the office today showed HR 64 with Mobitz type 1 second degree AV block. Skipped beat after 3 beats.  Discontinued Metoprolol from med rec Advised caretaker to remove it  from home medications.        Endocrine   Type 2 diabetes mellitus with diabetic chronic kidney disease ([place]) - Primary    Lab Results  Component Value Date   HGBA1C 6.8 (A) 03/08/2020   HGBA1C 6.8 03/08/2020   Would decrease Lantus to 18 U QD as patient has c/o fatigue Goal HbA1C for him would be between 7.0-8.0      Relevant Orders   POCT glycosylated hemoglobin (Hb A1C) (Completed)     Genitourinary   BPH (benign prostatic hyperplasia)    On Flomax and Finasteride        Other   Dark urine    Caregiver reports dark urine Recent episode of UTI treated with Keflex in ER Check UA with reflex culture Advised to increase fluid intake to at least 1.5-2 l in a day       Other Visit Diagnoses    Diabetic eye exam ([place])       Relevant Orders   Ambulatory referral to Ophthalmology   Urinary tract infection without hematuria, site unspecified       Relevant Medications   sulfamethoxazole-trimethoprim (BACTRIM DS) 800-160 MG tablet     Addendum: UA reviewed, positive for LE. Check urine culture. Started Bactrim.  Meds ordered this encounter  Medications  . sulfamethoxazole-trimethoprim (BACTRIM DS) 800-160 MG tablet    Sig: Take 1 tablet by mouth 2 (two) times daily.    Dispense:  10 tablet    Refill:  0    Follow-up: Return in about 4 weeks (around 04/05/2020) for Medications review.    [name], MD

## 2020-03-08 NOTE — Assessment & Plan Note (Signed)
BP Readings from Last 1 Encounters:  03/08/20 (!) 148/80   Elevated Unsure of current medication intake Metoprolol was recently discontinued due Mobitz type 1 second degree AV block in the ER Caretaker reports that he is still getting it from Pharmacy, but patient does not take medicines at times. Counseled for compliance with the medications Advised low salt diet

## 2020-03-08 NOTE — Assessment & Plan Note (Signed)
Caregiver reports dark urine Recent episode of UTI treated with Keflex in ER Check UA with reflex culture Advised to increase fluid intake to at least 1.5-2 l in a day

## 2020-03-08 NOTE — Assessment & Plan Note (Signed)
On Flomax and Finasteride

## 2020-03-09 ENCOUNTER — Ambulatory Visit (INDEPENDENT_AMBULATORY_CARE_PROVIDER_SITE_OTHER): Payer: Medicare Other | Admitting: *Deleted

## 2020-03-09 DIAGNOSIS — N3001 Acute cystitis with hematuria: Secondary | ICD-10-CM | POA: Diagnosis not present

## 2020-03-09 LAB — POCT URINALYSIS DIP (CLINITEK)
Bilirubin, UA: NEGATIVE
Glucose, UA: NEGATIVE mg/dL
Ketones, POC UA: NEGATIVE mg/dL
Nitrite, UA: NEGATIVE
POC PROTEIN,UA: 30 — AB
Spec Grav, UA: 1.015 (ref 1.010–1.025)
Urobilinogen, UA: 0.2 E.U./dL
pH, UA: 5.5 (ref 5.0–8.0)

## 2020-03-09 MED ORDER — SULFAMETHOXAZOLE-TRIMETHOPRIM 800-160 MG PO TABS: 1.0000 | ORAL_TABLET | Freq: Two times a day (BID) | ORAL | 0 refills | Status: DC

## 2020-03-09 NOTE — Addendum Note (Signed)
Addended byIhor Dow on: 03/09/2020 10:03 AM   Modules accepted: Orders

## 2020-03-11 ENCOUNTER — Ambulatory Visit: Payer: Medicare Other | Admitting: Family Medicine

## 2020-03-12 LAB — URINE CULTURE

## 2020-04-05 ENCOUNTER — Encounter: Payer: Self-pay | Admitting: Internal Medicine

## 2020-04-05 ENCOUNTER — Ambulatory Visit (INDEPENDENT_AMBULATORY_CARE_PROVIDER_SITE_OTHER): Payer: Medicare Other | Admitting: Internal Medicine

## 2020-04-05 VITALS — BP 137/82 | HR 87 | Resp 16 | Ht 71.0 in

## 2020-04-05 DIAGNOSIS — M130 Polyarthritis, unspecified: Secondary | ICD-10-CM

## 2020-04-05 DIAGNOSIS — L97511 Non-pressure chronic ulcer of other part of right foot limited to breakdown of skin: Secondary | ICD-10-CM | POA: Diagnosis not present

## 2020-04-05 DIAGNOSIS — E1122 Type 2 diabetes mellitus with diabetic chronic kidney disease: Secondary | ICD-10-CM

## 2020-04-05 DIAGNOSIS — N183 Chronic kidney disease, stage 3 unspecified: Secondary | ICD-10-CM | POA: Diagnosis not present

## 2020-04-05 DIAGNOSIS — I10 Essential (primary) hypertension: Secondary | ICD-10-CM | POA: Diagnosis not present

## 2020-04-05 MED ORDER — BLOOD GLUCOSE METER KIT: PACK | 0 refills | Status: AC

## 2020-04-05 MED ORDER — MUPIROCIN CALCIUM 2 % EX CREA
1.0000 | TOPICAL_CREAM | Freq: Two times a day (BID) | CUTANEOUS | 0 refills | Status: DC
Start: 2020-04-05 — End: 2021-04-04

## 2020-04-05 MED ORDER — BLOOD GLUCOSE METER KIT: PACK | 0 refills | Status: DC

## 2020-04-05 NOTE — Patient Instructions (Signed)
Please continue to take medications as prescribed.  Please apply Mupirocin cream over the right sole area. Please keep the area clean and dry. If it does not improve in 1 week, please contact us.  Please increase water intake to at least 60 ounces per day.

## 2020-04-05 NOTE — Progress Notes (Signed)
Established Patient Office Visit  Subjective:  Patient ID: Mike Briggs, male    DOB: 06/03/1928  Age: 87 y.o. MRN: [id number]  CC:  Chief Complaint  Patient presents with  . Follow-up    4 week follow up both feet are hurting him right foot is hurting worse than the left     HPI Mike Briggs presents for follow up of DM and c/o right more than left ankle pain.  DM: His HbA1C was 6.8. He has decreased his [place] to 18 U now. He seems more active compared to previous visit. He has not been able to check blood glucose at home as he could not get glucometer supplies.  Arthritis: He has been taking Meloxicam and Gabapentin for pain, but continues to have pain. He later mentioned having a wound over his right sole, and shows the site of pain over it. His caregiver mentions that he has been cleaning it with alcohol and applies vaseline over it and it has been improving now. Denies any discharge from the site.  HTN: BP is well-controlled. Takes medications regularly. Patient denies headache, dizziness, chest pain, dyspnea or palpitations.   Past Medical History:  Diagnosis Date  . Acute cystitis 12/17/2018  . Acute encephalopathy 11/14/2018  . ANKLE, ARTHRITIS, DEGEN./OSTEO 12/16/2008   Qualifier: Diagnosis of  By: Aline Brochure MD, Dorothyann Peng    . Arthritis   . Cataract   . Chronic kidney disease   . COVID-19   . COVID-19 virus detected 11/03/2018  . COVID-19 virus infection 11/03/2018  . Dehydration 11/02/2018  . Depression   . Diabetes mellitus   . Gram-positive bacteremia 11/03/2018  . Hyperlipidemia   . Hypertension   . Hypomagnesemia 03/24/2019  . Klebsiella UTI  03/25/2019  . Pain in finger of right hand 05/27/2019  . Personal history of gout 05/22/2016  . Rhabdomyolysis 11/02/2018  . Stroke ([place])   . Swelling of both hands 05/27/2019  . Syncope 02/25/2018  . TIA (transient ischemic attack) 11/14/2018  . UTI (urinary tract infection) 02/26/2018  . Vitamin D deficiency 05/31/2016     Past Surgical History:  Procedure Laterality Date  . BACK SURGERY     cervical and lumbar  . CATARACT EXTRACTION    . CHOLECYSTECTOMY    . FOOT SURGERY     5th toe   . SPINE SURGERY     lumbar and cervical fusions    Family History  Family history unknown: Yes    Social History   Socioeconomic History  . Marital status: Divorced    Spouse name: Not on file  . Number of children: 3  . Years of education: 10  . Highest education level: Not on file  Occupational History  . Occupation: Retired.  Tobacco Use  . Smoking status: Former Smoker    Quit date: 08/07/1967    Years since quitting: 52.6  . Smokeless tobacco: Never Used  Vaping Use  . Vaping Use: Never used  Substance and Sexual Activity  . Alcohol use: No  . Drug use: No  . Sexual activity: Not Currently  Other Topics Concern  . Not on file  Social History Narrative   Wheelchair bound.Has not walked in many years, at least five years.  Lives alone. Has 2 aides that come in to help him.   Lives alone.    Retired.   Eats all food groups.    Has 3 children but does not have contact with them.   Divorced.  Social Determinants of Health   Financial Resource Strain: Not on file  Food Insecurity: Not on file  Transportation Needs: No Transportation Needs  . Lack of Transportation (Medical): No  . Lack of Transportation (Non-Medical): No  Physical Activity: Inactive  . Days of Exercise per Week: 0 days  . Minutes of Exercise per Session: 0 min  Stress: No Stress Concern Present  . Feeling of Stress : Not at all  Social Connections: Socially Isolated  . Frequency of Communication with Friends and Family: Once a week  . Frequency of Social Gatherings with Friends and Family: Once a week  . Attends Religious Services: More than 4 times per year  . Active Member of Clubs or Organizations: No  . Attends Archivist Meetings: Never  . Marital Status: Divorced  Human resources officer Violence: Not on file     Outpatient Medications Prior to Visit  Medication Sig Dispense Refill  . amLODipine (NORVASC) 10 MG tablet TAKE ONE TABLET BY MOUTH ONCE DAILY 30 tablet 5  . aspirin 81 MG chewable tablet Chew 1 tablet (81 mg total) by mouth daily with breakfast. 30 tablet 3  . atorvastatin (LIPITOR) 10 MG tablet Take 1 tablet (10 mg total) by mouth every evening. 30 tablet 5  . finasteride (PROSCAR) 5 MG tablet Take 1 tablet (5 mg total) by mouth daily. 90 tablet 3  . gabapentin (NEURONTIN) 100 MG capsule Take 1 capsule (100 mg total) by mouth 3 (three) times daily as needed (for ankle pain). 90 capsule 3  . LANTUS SOLOSTAR 100 UNIT/ML Solostar Pen Inject 20 Units into the skin daily. (Patient taking differently: Inject 18 Units into the skin daily.) 15 mL 3  . lisinopril (ZESTRIL) 40 MG tablet TAKE (1) TABLET BY MOUTH ONCE DAILY. 30 tablet 5  . meloxicam (MOBIC) 7.5 MG tablet Take 1 tablet (7.5 mg total) by mouth daily. 30 tablet 5  . memantine (NAMENDA) 10 MG tablet TAKE (1) TABLET BY MOUTH TWICE DAILY. 60 tablet 5  . SURE COMFORT PEN NEEDLES 31G X 5 MM MISC     . tamsulosin (FLOMAX) 0.4 MG CAPS capsule Take 1 capsule (0.4 mg total) by mouth daily after breakfast. 90 capsule 3  . vitamin C (VITAMIN C) 500 MG tablet Take 1 tablet (500 mg total) by mouth daily.    Marland Kitchen zinc sulfate 220 (50 Zn) MG capsule Take 1 capsule (220 mg total) by mouth daily.    . blood glucose meter kit and supplies Dispense based on patient and insurance preference. Use up to four times daily as directed. (FOR ICD-10 E10.9, E11.9). 1 each 0  . sulfamethoxazole-trimethoprim (BACTRIM DS) 800-160 MG tablet Take 1 tablet by mouth 2 (two) times daily. 10 tablet 0   No facility-administered medications prior to visit.    No Known Allergies  ROS Review of Systems  Constitutional: Positive for fatigue. Negative for chills and fever.  HENT: Negative for congestion and sore throat.   Eyes: Negative for pain and discharge.  Respiratory:  Negative for cough and shortness of breath.   Cardiovascular: Negative for chest pain and palpitations.  Gastrointestinal: Negative for constipation, diarrhea, nausea and vomiting.  Endocrine: Negative for polydipsia and polyuria.  Genitourinary: Negative for dysuria and hematuria.  Musculoskeletal: Positive for arthralgias. Negative for neck pain and neck stiffness.  Skin: Positive for wound. Negative for rash.  Neurological: Negative for dizziness, weakness, numbness and headaches.  Psychiatric/Behavioral: Negative for agitation and behavioral problems.      Objective:  Physical Exam Vitals reviewed.  Constitutional:      General: He is not in acute distress.    Appearance: He is not diaphoretic.  HENT:     Head: Normocephalic and atraumatic.     Nose: Nose normal.     Mouth/Throat:     Mouth: Mucous membranes are moist.  Eyes:     General: No scleral icterus.    Extraocular Movements: Extraocular movements intact.     Pupils: Pupils are equal, round, and reactive to light.  Cardiovascular:     Rate and Rhythm: Normal rate and regular rhythm.     Pulses: Normal pulses.     Heart sounds: Normal heart sounds. No murmur heard.   Pulmonary:     Breath sounds: Normal breath sounds. No wheezing or rales.  Abdominal:     Palpations: Abdomen is soft.     Tenderness: There is no abdominal tenderness.  Musculoskeletal:     Cervical back: Neck supple. No tenderness.     Right lower leg: No edema.     Left lower leg: No edema.  Skin:    General: Skin is warm.     Comments: Right heel superficial ulcer, with granulation tissue Yellowish discoloration on the borders  Neurological:     General: No focal deficit present.     Mental Status: He is alert and oriented to person, place, and time.  Psychiatric:        Mood and Affect: Mood normal.        Behavior: Behavior normal.     BP 137/82 (BP Location: Right Arm, Patient Position: Sitting)   Pulse 87   Resp 16   Ht 5'  11" (1.803 m)   SpO2 95%   BMI 31.39 kg/m  Wt Readings from Last 3 Encounters:  02/04/20 225 lb 1.4 oz (102.1 kg)  01/15/20 225 lb (102.1 kg)  12/10/19 225 lb (102.1 kg)     Health Maintenance Due  Topic Date Due  . COVID-19 Vaccine (1) Never done  . OPHTHALMOLOGY EXAM  12/25/2016  . FOOT EXAM  12/25/2017    There are no preventive care reminders to display for this patient.  Lab Results  Component Value Date   TSH 0.849 12/18/2018   Lab Results  Component Value Date   WBC 16.1 (H) 02/04/2020   HGB 11.6 (L) 02/04/2020   HCT 38.0 (L) 02/04/2020   MCV 98.7 02/04/2020   PLT 262 02/04/2020   Lab Results  Component Value Date   NA 137 02/04/2020   K 5.3 (H) 02/04/2020   CO2 23 02/04/2020   GLUCOSE 139 (H) 02/04/2020   BUN 41 (H) 02/04/2020   CREATININE 1.97 (H) 02/04/2020   BILITOT 0.5 02/04/2020   ALKPHOS 109 02/04/2020   AST 12 (L) 02/04/2020   ALT 13 02/04/2020   PROT 7.3 02/04/2020   ALBUMIN 2.9 (L) 02/04/2020   CALCIUM 8.8 (L) 02/04/2020   ANIONGAP 8 02/04/2020   Lab Results  Component Value Date   CHOL 146 11/14/2018   Lab Results  Component Value Date   HDL 28 (L) 11/14/2018   Lab Results  Component Value Date   LDLCALC 94 11/14/2018   Lab Results  Component Value Date   TRIG 120 11/14/2018   Lab Results  Component Value Date   CHOLHDL 5.2 11/14/2018   Lab Results  Component Value Date   HGBA1C 6.8 (A) 03/08/2020   HGBA1C 6.8 03/08/2020      Assessment & Plan:  Problem List Items Addressed This Visit      Cardiovascular and Mediastinum   Essential hypertension    BP Readings from Last 1 Encounters:  04/05/20 (!) 154/87   Elevated today On Lisinopril 40 mg QD and Amlodipine 10 mg QD Counseled for compliance with the medications         Endocrine   Type 2 diabetes mellitus with diabetic chronic kidney disease ([place])    Lab Results  Component Value Date   HGBA1C 6.8 (A) 03/08/2020   HGBA1C 6.8 03/08/2020   Decreased  Lantus to 18 U QD as patient had c/o fatigue Goal HbA1C for him would be between 7.0-8.0        Musculoskeletal and Integument   Polyarthritis    Ankle pain, on Meloxicam Gabapentin was added recently Unable to increase dose of Meloxicam due to CKD stage 3b       Other Visit Diagnoses    Ulcer of right foot, limited to breakdown of skin ([place])    -  Primary   Relevant Medications   mupirocin cream (BACTROBAN) 2 %    If persistent wound, will refer to wound clinic.    Meds ordered this encounter  Medications  . mupirocin cream (BACTROBAN) 2 %    Sig: Apply 1 application topically 2 (two) times daily.    Dispense:  15 g    Refill:  0  . DISCONTD: blood glucose meter kit and supplies    Sig: Dispense based on patient and insurance preference. Use up to four times daily as directed. (FOR ICD-10 E10.9, E11.9).    Dispense:  1 each    Refill:  0    Order Specific Question:   Number of strips    Answer:   100    Order Specific Question:   Number of lancets    Answer:   100  . blood glucose meter kit and supplies    Sig: Dispense based on patient and insurance preference. Use up to four times daily as directed. (FOR ICD-10 E10.9, E11.9).    Dispense:  1 each    Refill:  0    Order Specific Question:   Number of strips    Answer:   100    Order Specific Question:   Number of lancets    Answer:   100    Follow-up: Return in about 3 months (around 07/06/2020) for HbA1C and arthritis.    [name], MD

## 2020-04-05 NOTE — Assessment & Plan Note (Signed)
Ankle pain, on Meloxicam Gabapentin was added recently Unable to increase dose of Meloxicam due to CKD stage 3b

## 2020-04-05 NOTE — Assessment & Plan Note (Signed)
BP Readings from Last 1 Encounters:  04/05/20 (!) 154/87   Elevated today On Lisinopril 40 mg QD and Amlodipine 10 mg QD Counseled for compliance with the medications

## 2020-04-05 NOTE — Assessment & Plan Note (Signed)
Lab Results  Component Value Date   HGBA1C 6.8 (A) 03/08/2020   HGBA1C 6.8 03/08/2020   Decreased Lantus to 18 U QD as patient had c/o fatigue Goal HbA1C for him would be between 7.0-8.0

## 2020-04-18 NOTE — Progress Notes (Deleted)
Subjective: No diagnosis found.   01/15/20: Mike Briggs returns today in f/u.  He was placed back on keflex suppression after his last visit for a recurrent UTI but has not been on it for about 2 months. .  He remains on finasteride and tamsulosin.   He had a CT AP on 09/16/19.   His UA today still looks infected and his PVR remains 347ml.   He has dysuria but no hematuria and he doesn't wake too much at night.   09/11/19:Mike Briggs returns today in f/u he has a history of BPH with bOO and has been on tamsulosin.  Finasteride was added at his last visit but he has run out.  He was found to have Klebsiella at his last visit and was given keflex for therapy and now suppression with $RemoveBeforeDE'250mg'oDZtYZLnmauuYiK$  nightly.   He continues to have cloudy urine and dysuria.  He has had no hematuria.  His PVR today is 176ml and his IPSS is 29 but he feels like he is voiding ok.  A subsequent cath PVR was 363ml and the urine was cloudy.   GU Hx: Mike Briggs is an 87 yo male who is sent in consultation by Cherly Beach NP for bilateral hydroceles.   He had a scrotal US in 2018 that confirmed hydroceles.  He has also had recurrent UTI's with cloudy white urine.  He has some dysuria.  He has some urgency.  He has minimal nocturia but can have enuresis.  He has a weak stream and a sensation of incomplete emptying.  He has been constipation and started a stool softener.  He has had no hematuria.  He has no recollection of any GU surgery.  He has had no stones.   He has been hospitalized for UTI's with the most recent trip to the ER on 06/05/19.  The culture grew citrobacter. He had klebsiella on 03/23/19, 12/17/18, 02/25/18 and 10/08/17.  When the urine starts he can't stop it.  He does have some sputtering of the stream.   He had a CT in 2014 and the bladder was noted to be distented.   ROS:  ROS  No Known Allergies  Past Medical History:  Diagnosis Date  . Acute cystitis 12/17/2018  . Acute encephalopathy 11/14/2018  . ANKLE, ARTHRITIS,  DEGEN./OSTEO 12/16/2008   Qualifier: Diagnosis of  By: Aline Brochure MD, Dorothyann Peng    . Arthritis   . Cataract   . Chronic kidney disease   . COVID-19   . COVID-19 virus detected 11/03/2018  . COVID-19 virus infection 11/03/2018  . Dehydration 11/02/2018  . Depression   . Diabetes mellitus   . Gram-positive bacteremia 11/03/2018  . Hyperlipidemia   . Hypertension   . Hypomagnesemia 03/24/2019  . Klebsiella UTI  03/25/2019  . Pain in finger of right hand 05/27/2019  . Personal history of gout 05/22/2016  . Rhabdomyolysis 11/02/2018  . Stroke ([place])   . Swelling of both hands 05/27/2019  . Syncope 02/25/2018  . TIA (transient ischemic attack) 11/14/2018  . UTI (urinary tract infection) 02/26/2018  . Vitamin D deficiency 05/31/2016    Past Surgical History:  Procedure Laterality Date  . BACK SURGERY     cervical and lumbar  . CATARACT EXTRACTION    . CHOLECYSTECTOMY    . FOOT SURGERY     5th toe   . SPINE SURGERY     lumbar and cervical fusions    Social History   Socioeconomic History  . Marital status: Divorced  Spouse name: Not on file  . Number of children: 3  . Years of education: 10  . Highest education level: Not on file  Occupational History  . Occupation: Retired.  Tobacco Use  . Smoking status: Former Smoker    Quit date: 08/07/1967    Years since quitting: 52.7  . Smokeless tobacco: Never Used  Vaping Use  . Vaping Use: Never used  Substance and Sexual Activity  . Alcohol use: No  . Drug use: No  . Sexual activity: Not Currently  Other Topics Concern  . Not on file  Social History Narrative   Wheelchair bound.Has not walked in many years, at least five years.  Lives alone. Has 2 aides that come in to help him.   Lives alone.    Retired.   Eats all food groups.    Has 3 children but does not have contact with them.   Divorced.   Social Determinants of Health   Financial Resource Strain: Not on file  Food Insecurity: Not on file  Transportation Needs: No  Transportation Needs  . Lack of Transportation (Medical): No  . Lack of Transportation (Non-Medical): No  Physical Activity: Inactive  . Days of Exercise per Week: 0 days  . Minutes of Exercise per Session: 0 min  Stress: No Stress Concern Present  . Feeling of Stress : Not at all  Social Connections: Socially Isolated  . Frequency of Communication with Friends and Family: Once a week  . Frequency of Social Gatherings with Friends and Family: Once a week  . Attends Religious Services: More than 4 times per year  . Active Member of Clubs or Organizations: No  . Attends Archivist Meetings: Never  . Marital Status: Divorced  Human resources officer Violence: Not on file    Family History  Family history unknown: Yes    Anti-infectives: Anti-infectives (From admission, onward)   None      Current Outpatient Medications  Medication Sig Dispense Refill  . amLODipine (NORVASC) 10 MG tablet TAKE ONE TABLET BY MOUTH ONCE DAILY 30 tablet 5  . aspirin 81 MG chewable tablet Chew 1 tablet (81 mg total) by mouth daily with breakfast. 30 tablet 3  . atorvastatin (LIPITOR) 10 MG tablet Take 1 tablet (10 mg total) by mouth every evening. 30 tablet 5  . blood glucose meter kit and supplies Dispense based on patient and insurance preference. Use up to four times daily as directed. (FOR ICD-10 E10.9, E11.9). 1 each 0  . finasteride (PROSCAR) 5 MG tablet Take 1 tablet (5 mg total) by mouth daily. 90 tablet 3  . gabapentin (NEURONTIN) 100 MG capsule Take 1 capsule (100 mg total) by mouth 3 (three) times daily as needed (for ankle pain). 90 capsule 3  . LANTUS SOLOSTAR 100 UNIT/ML Solostar Pen Inject 20 Units into the skin daily. (Patient taking differently: Inject 18 Units into the skin daily.) 15 mL 3  . lisinopril (ZESTRIL) 40 MG tablet TAKE (1) TABLET BY MOUTH ONCE DAILY. 30 tablet 5  . meloxicam (MOBIC) 7.5 MG tablet Take 1 tablet (7.5 mg total) by mouth daily. 30 tablet 5  . memantine  (NAMENDA) 10 MG tablet TAKE (1) TABLET BY MOUTH TWICE DAILY. 60 tablet 5  . mupirocin cream (BACTROBAN) 2 % Apply 1 application topically 2 (two) times daily. 15 g 0  . SURE COMFORT PEN NEEDLES 31G X 5 MM MISC     . tamsulosin (FLOMAX) 0.4 MG CAPS capsule Take 1 capsule (0.4  mg total) by mouth daily after breakfast. 90 capsule 3  . vitamin C (VITAMIN C) 500 MG tablet Take 1 tablet (500 mg total) by mouth daily.    Marland Kitchen zinc sulfate 220 (50 Zn) MG capsule Take 1 capsule (220 mg total) by mouth daily.     No current facility-administered medications for this visit.     Objective: Vital signs in last 24 hours: There were no vitals taken for this visit.  Intake/Output from previous day: No intake/output data recorded. Intake/Output this shift: $RemoveBef'@IOTHISSHIFT'ErYtmABawK$ @   Physical Exam Constitutional:      Appearance: Normal appearance.     Comments: Able to stand with assistance.   Neurological:     Mental Status: He is alert.     Lab Results:  No results found for this or any previous visit (from the past 24 hour(s)).  BMET No results for input(s): NA, K, CL, CO2, GLUCOSE, BUN, CREATININE, CALCIUM in the last 72 hours. PT/INR No results for input(s): LABPROT, INR in the last 72 hours. ABG No results for input(s): PHART, HCO3 in the last 72 hours.  Invalid input(s): PCO2, PO2  Studies/Results: No results found.   Assessment/Plan: BPH with BOO and incomplete emptying. He will remain on tamsulosin and finasteride.  Chronic cystitis.  His urine remains purulent today.  Urine culture sent.  I will adjust antibiotics as needed.     No orders of the defined types were placed in this encounter.    No orders of the defined types were placed in this encounter.    No follow-ups on file.    CC: Cherly Beach NP    Irine Seal 04/18/2020 [phone]

## 2020-04-20 ENCOUNTER — Other Ambulatory Visit: Payer: Self-pay | Admitting: Nurse Practitioner

## 2020-04-21 ENCOUNTER — Ambulatory Visit: Payer: Medicare Other | Admitting: Urology

## 2020-05-26 ENCOUNTER — Other Ambulatory Visit: Payer: Self-pay | Admitting: Nurse Practitioner

## 2020-05-26 ENCOUNTER — Other Ambulatory Visit: Payer: Self-pay | Admitting: Family Medicine

## 2020-05-26 DIAGNOSIS — M19049 Primary osteoarthritis, unspecified hand: Secondary | ICD-10-CM

## 2020-05-27 ENCOUNTER — Telehealth: Payer: Self-pay | Admitting: Family Medicine

## 2020-05-27 NOTE — Telephone Encounter (Signed)
Left message for patient to call back and schedule Medicare Annual Wellness Visit (AWV) either virtually or in office.   AWV-I PER [place] 12/30/2019  please schedule at anytime with [facility]  health coach  This should be a 40 minute visit.

## 2020-05-31 DIAGNOSIS — T148XXA Other injury of unspecified body region, initial encounter: Secondary | ICD-10-CM | POA: Diagnosis not present

## 2020-06-05 DIAGNOSIS — R531 Weakness: Secondary | ICD-10-CM | POA: Diagnosis not present

## 2020-06-05 DIAGNOSIS — R5381 Other malaise: Secondary | ICD-10-CM | POA: Diagnosis not present

## 2020-06-23 ENCOUNTER — Ambulatory Visit: Payer: Medicare Other | Admitting: Urology

## 2020-06-23 DIAGNOSIS — N138 Other obstructive and reflux uropathy: Secondary | ICD-10-CM

## 2020-06-24 ENCOUNTER — Other Ambulatory Visit: Payer: Self-pay | Admitting: Nurse Practitioner

## 2020-06-24 DIAGNOSIS — M19049 Primary osteoarthritis, unspecified hand: Secondary | ICD-10-CM

## 2020-06-28 ENCOUNTER — Other Ambulatory Visit: Payer: Self-pay | Admitting: *Deleted

## 2020-06-28 ENCOUNTER — Telehealth: Payer: Self-pay

## 2020-06-28 MED ORDER — SURE COMFORT PEN NEEDLES 31G X 5 MM MISC: 1.0000 | Freq: Three times a day (TID) | 0 refills | Status: DC

## 2020-06-28 NOTE — Telephone Encounter (Signed)
Needles sent to pharmacy

## 2020-06-28 NOTE — Telephone Encounter (Signed)
Patient needs refills:  blood glucose meter kit and supplies SURE COMFORT PEN NEEDLES 31G X 5 MM MISC   Pharmacy: [facility]

## 2020-07-06 ENCOUNTER — Ambulatory Visit: Payer: Medicare Other | Admitting: Internal Medicine

## 2020-07-21 ENCOUNTER — Ambulatory Visit: Payer: Medicare HMO | Admitting: Internal Medicine

## 2020-07-25 ENCOUNTER — Other Ambulatory Visit: Payer: Self-pay | Admitting: Nurse Practitioner

## 2020-07-25 ENCOUNTER — Other Ambulatory Visit: Payer: Self-pay | Admitting: Internal Medicine

## 2020-07-25 DIAGNOSIS — M19049 Primary osteoarthritis, unspecified hand: Secondary | ICD-10-CM

## 2020-08-04 ENCOUNTER — Ambulatory Visit: Payer: Medicare HMO | Admitting: Urology

## 2020-08-07 DIAGNOSIS — R5381 Other malaise: Secondary | ICD-10-CM | POA: Diagnosis not present

## 2020-08-07 DIAGNOSIS — W19XXXA Unspecified fall, initial encounter: Secondary | ICD-10-CM | POA: Diagnosis not present

## 2020-08-07 DIAGNOSIS — R531 Weakness: Secondary | ICD-10-CM | POA: Diagnosis not present

## 2020-08-17 ENCOUNTER — Other Ambulatory Visit: Payer: Self-pay | Admitting: Internal Medicine

## 2020-08-17 ENCOUNTER — Other Ambulatory Visit: Payer: Self-pay | Admitting: Nurse Practitioner

## 2020-08-17 DIAGNOSIS — M19049 Primary osteoarthritis, unspecified hand: Secondary | ICD-10-CM

## 2020-08-18 ENCOUNTER — Other Ambulatory Visit: Payer: Self-pay | Admitting: Family Medicine

## 2020-08-25 ENCOUNTER — Other Ambulatory Visit: Payer: Self-pay

## 2020-08-25 ENCOUNTER — Ambulatory Visit (INDEPENDENT_AMBULATORY_CARE_PROVIDER_SITE_OTHER): Payer: Medicare HMO | Admitting: Urology

## 2020-08-25 DIAGNOSIS — N401 Enlarged prostate with lower urinary tract symptoms: Secondary | ICD-10-CM

## 2020-08-25 DIAGNOSIS — N138 Other obstructive and reflux uropathy: Secondary | ICD-10-CM

## 2020-08-25 DIAGNOSIS — R339 Retention of urine, unspecified: Secondary | ICD-10-CM | POA: Diagnosis not present

## 2020-08-25 DIAGNOSIS — N3020 Other chronic cystitis without hematuria: Secondary | ICD-10-CM

## 2020-08-25 DIAGNOSIS — Z7689 Persons encountering health services in other specified circumstances: Secondary | ICD-10-CM | POA: Diagnosis not present

## 2020-08-25 LAB — URINALYSIS, ROUTINE W REFLEX MICROSCOPIC
Bilirubin, UA: NEGATIVE
Glucose, UA: NEGATIVE
Ketones, UA: NEGATIVE
Nitrite, UA: NEGATIVE
Specific Gravity, UA: 1.015 (ref 1.005–1.030)
Urobilinogen, Ur: 0.2 mg/dL (ref 0.2–1.0)
pH, UA: 7.5 (ref 5.0–7.5)

## 2020-08-25 LAB — BLADDER SCAN AMB NON-IMAGING

## 2020-08-25 LAB — MICROSCOPIC EXAMINATION
Epithelial Cells (non renal): NONE SEEN /hpf (ref 0–10)
Renal Epithel, UA: NONE SEEN /hpf
WBC, UA: >30 /hpf — AB (ref 0–5)

## 2020-08-25 MED ORDER — CEPHALEXIN 250 MG PO CAPS: ORAL_CAPSULE | ORAL | 5 refills | Status: DC

## 2020-08-25 NOTE — Progress Notes (Signed)
Subjective: 1. BPH with urinary obstruction   2. Incomplete bladder emptying   3. Chronic cystitis     08/25/20: Mike Briggs returns today in Briggs.  He had an e. Coli infection in 2/22 and was treated with bactrim. His Cr was 1.97 in 1/22.  Today he is reporting dysuria and urgency with UUI and also has burning with a BM.   He has a PVR of 175m and a cath UA was purulent.   He remains on finasteride and tamsulosin.    01/15/20: Mike Briggs.  He was placed back on keflex suppression after his last visit for a recurrent UTI but has not been on it for about 2 months. .  He remains on finasteride and tamsulosin.   He had a CT AP on 09/16/19.   His UA today still looks infected and his PVR remains 3527m   He has dysuria but no hematuria and he doesn't wake too much at night.   09/11/19:Mike Briggs he has a history of BPH with bOO and has been on tamsulosin.  Finasteride was added at his last visit but he has run out.  He was found to have Klebsiella at his last visit and was given keflex for therapy and now suppression with 25085mightly.   He continues to have cloudy urine and dysuria.  He has had no hematuria.  His PVR today is 179m58md his IPSS is 29 but he feels like he is voiding ok.  A subsequent cath PVR was 300ml86m the urine was cloudy.   GU Hx: Mike Briggs 89 yo48ale who is sent in consultation by HannaCherly Beachor bilateral hydroceles.   [place]  He has also had recurrent UTI's with cloudy white urine.  He has some dysuria.  He has some urgency.  He has minimal nocturia but can have enuresis.  He has a weak stream and a sensation of incomplete emptying.  He has been constipation and started a stool softener.  He has had no hematuria.  He has no recollection of any GU surgery.  He has had no stones.   He has been hospitalized for UTI's with the most recent trip to the ER on 06/05/19.  The culture grew  citrobacter. He had klebsiella on 03/23/19, 12/17/18, 02/25/18 and 10/08/17.  When the urine starts he can't stop it.  He does have some sputtering of the stream.   He had a CT in 2014 and the bladder was noted to be distented.   ROS:  ROS  No Known Allergies  Past Medical History:  Diagnosis Date   Acute cystitis 12/17/2018   Acute encephalopathy 11/14/2018   ANKLE, ARTHRITIS, DEGEN./OSTEO 12/16/2008   Qualifier: Diagnosis of  By: HarriAline BrochureStanlDorothyann PengArthritis    Cataract    Chronic kidney disease    COVID-19    COVID-19 virus detected 11/03/2018   COVID-19 virus infection 11/03/2018   Dehydration 11/02/2018   Depression    Diabetes mellitus    Gram-positive bacteremia 11/03/2018   Hyperlipidemia    Hypertension    Hypomagnesemia 03/24/2019   Klebsiella UTI  03/25/2019   Pain in finger of right hand 05/27/2019   Personal history of gout 05/22/2016   Rhabdomyolysis 11/02/2018   [place]   Syncope 02/25/2018   TIA (transient ischemic attack)  11/14/2018   UTI (urinary tract infection) 02/26/2018   Vitamin D deficiency 05/31/2016    Past Surgical History:  Procedure Laterality Date   BACK SURGERY     cervical and lumbar   CATARACT EXTRACTION     CHOLECYSTECTOMY     FOOT SURGERY     5th toe    SPINE SURGERY     lumbar and cervical fusions    Social History   Socioeconomic History   Marital status: Divorced    Spouse name: Not on file   Number of children: 3   Years of education: 10   Highest education level: Not on file  Occupational History   Occupation: Retired.  Tobacco Use   Smoking status: Former    Types: Cigarettes    Quit date: 08/07/1967    Years since quitting: 53.0   Smokeless tobacco: Never  Vaping Use   Vaping Use: Never used  Substance and Sexual Activity   Alcohol use: No   Drug use: No   Sexual activity: Not Currently  Other Topics Concern   Not on file  Social History Narrative   Wheelchair bound.Has  not walked in many years, at least five years.  Lives alone. Has 2 aides that come in to help him.   Lives alone.    Retired.   Eats all food groups.    Has 3 children but does not have contact with them.   Divorced.   Social Determinants of Health   Financial Resource Strain: Not on file  Food Insecurity: Not on file  Transportation Needs: Not on file  Physical Activity: Not on file  Stress: Not on file  Social Connections: Not on file  Intimate Partner Violence: Not on file    Family History  Family history unknown: Yes    Anti-infectives: Anti-infectives (From admission, onward)    Start     Dose/Rate Route Frequency Ordered Stop   08/25/20 0000  cephALEXin (KEFLEX) 250 MG capsule           08/25/20 1006         Current Outpatient Medications  Medication Sig Dispense Refill   cephALEXin (KEFLEX) 250 MG capsule Take 1 capsule 3 x daily for a week and then take 1 po qhs with refills. 30 capsule 5   amLODipine (NORVASC) 10 MG tablet TAKE ONE TABLET BY MOUTH ONCE DAILY 30 tablet 0   aspirin 81 MG chewable tablet Chew 1 tablet (81 mg total) by mouth daily with breakfast. 30 tablet 3   atorvastatin (LIPITOR) 10 MG tablet TAKE 1 TABLET BY MOUTH AFTER SUPPER. 30 tablet 0   blood glucose meter kit and supplies Dispense based on patient and insurance preference. Use up to four times daily as directed. (FOR ICD-10 E10.9, E11.9). 1 each 0   finasteride (PROSCAR) 5 MG tablet Take 1 tablet (5 mg total) by mouth daily. 90 tablet 3   gabapentin (NEURONTIN) 100 MG capsule TAKE 1 CAPSULE BY MOUTH THREE TIMES A DAY. 90 capsule 0   LANTUS SOLOSTAR 100 UNIT/ML Solostar Pen Inject 20 Units into the skin daily. (Patient taking differently: Inject 18 Units into the skin daily.) 15 mL 3   lisinopril (ZESTRIL) 40 MG tablet TAKE (1) TABLET BY MOUTH ONCE DAILY. 30 tablet 0   meloxicam (MOBIC) 7.5 MG tablet TAKE 1 TABLET BY MOUTH ONCE DAILY. 30 tablet 0   memantine (NAMENDA) 10 MG tablet TAKE (1)  TABLET BY MOUTH TWICE DAILY. 60 tablet 0   mupirocin  cream (BACTROBAN) 2 % Apply 1 application topically 2 (two) times daily. 15 g 0   SURE COMFORT PEN NEEDLES 31G X 5 MM MISC USE TO INJECT AS DIRECTED FOUR TIMES A DAY. WITH MEALS AND BEFORE BEDTIME. 100 each 0   tamsulosin (FLOMAX) 0.4 MG CAPS capsule Take 1 capsule (0.4 mg total) by mouth daily after breakfast. 90 capsule 3   vitamin C (VITAMIN C) 500 MG tablet Take 1 tablet (500 mg total) by mouth daily.     zinc sulfate 220 (50 Zn) MG capsule Take 1 capsule (220 mg total) by mouth daily.     No current facility-administered medications for this visit.     Objective: Vital signs in last 24 hours: There were no vitals taken for this visit.  Intake/Output from previous day: No intake/output data recorded. Intake/Output this shift: _0 @   Physical Exam  Lab Results:  Results for orders placed or performed in visit on 08/25/20 (from the past 24 hour(s))  Urinalysis, Routine w reflex microscopic     Status: Abnormal   Collection Time: 08/25/20 10:24 AM  Result Value Ref Range   Specific Gravity, UA 1.015 1.005 - 1.030   pH, UA 7.5 5.0 - 7.5   Color, UA Amber (A) Yellow   Appearance Ur Cloudy (A) Clear   Leukocytes,UA 3+ Negative   Protein,UA 4+ Negative/Trace   Glucose, UA Negative Negative   Ketones, UA Negative Negative   RBC, UA 3+ Negative   Bilirubin, UA Negative Negative   Urobilinogen, Ur 0.2 0.2 - 1.0 mg/dL   Nitrite, UA Negative Negative   Microscopic Examination See below:    Narrative   Performed at:  [facility] [address] [place], [place]  [id number] Lab Director: Mina Marble MT, Phone:  [phone]  Microscopic Examination     Status: Abnormal   Collection Time: 08/25/20 10:24 AM   Urine  Result Value Ref Range   WBC, UA >30 (A) 0 - 5 /hpf   RBC 11-30 (A) 0 - 2 /hpf   Epithelial Cells (non renal) None seen 0 - 10 /hpf   Renal Epithel, UA None seen None seen /hpf   Mucus,  UA Present Not Estab.   Bacteria, UA Many (A) None seen/Few   Narrative   Performed at:  [facility] [address] [place], [place]  [id number] Lab Director: Mobile, Phone:  [phone]     BMET No results for input(s): NA, K, CL, CO2, GLUCOSE, BUN, CREATININE, CALCIUM in the last 72 hours. PT/INR No results for input(s): LABPROT, INR in the last 72 hours. ABG No results for input(s): PHART, HCO3 in the last 72 hours.  Invalid input(s): PCO2, PO2  Studies/Results: PVR reviewed.   Assessment/Plan: BPH with BOO and incomplete emptying but and improved PVR. He will remain on tamsulosin and finasteride.  Chronic cystitis.  His urine remains purulent today.  Urine culture sent.  I will treat with keflex and resume suppression.     Meds ordered this encounter  Medications   cephALEXin (KEFLEX) 250 MG capsule    Sig: Take 1 capsule 3 x daily for a week and then take 1 po qhs with refills.    Dispense:  30 capsule    Refill:  5      Orders Placed This Encounter  Procedures   Urine Culture   Urine Culture   Microscopic Examination   Urinalysis, Routine w reflex microscopic   BLADDER SCAN AMB  NON-IMAGING     Return in about 3 months (around 11/25/2020).    CC: Cherly Beach NP    Irine Seal 08/25/2020 [phone]

## 2020-08-25 NOTE — Progress Notes (Deleted)
Urological Symptom Review PVR121 ml  Patient is experiencing the following symptoms:     Review of Systems  Gastrointestinal (upper)  : {chl amb uro bua ros ugi:[phone]}  Gastrointestinal (lower) : {chl amb uro ros lgi:23408}  Constitutional : {chl amb uro ros contitutional:[phone]}  Skin: {chl amb uro ros skin:[phone]}  Eyes: {chl amb uro ros eyes:[phone]}  Ear/Nose/Throat : {chl amb uro ros [place]:9232117  Hematologic/Lymphatic: {chl amb uro ros heme/lymph:[phone]}  Cardiovascular : {chl amb uro ros cardio:[phone]}  Respiratory : {chl amb uro ros resp:[id number]}  Endocrine: {chl amb uro ros [phone]}  Musculoskeletal: {chl amb uro ros muscle:[phone]}  Neurological: {chl amb uro ros Neuro:[phone]}  Psychologic: {chl amb uro ros [phone]}

## 2020-08-25 NOTE — Progress Notes (Signed)
Urological Symptom Review PVR 132m Patient is experiencing the following symptoms: Burning/pain with urination   Review of Systems  Gastrointestinal (upper)  : Negative for upper GI symptoms  Gastrointestinal (lower) : Negative for lower GI symptoms  Constitutional : Negative for symptoms  Skin: Negative for skin symptoms  Eyes: Negative for eye symptoms  Ear/Nose/Throat : Negative for Ear/Nose/Throat symptoms  Hematologic/Lymphatic: Negative for Hematologic/Lymphatic symptoms  Cardiovascular : Negative for cardiovascular symptoms  Respiratory : Negative for respiratory symptoms  Endocrine: Negative for endocrine symptoms  Musculoskeletal: Negative for musculoskeletal symptoms  Neurological: Negative for neurological symptoms  Psychologic: Negative for psychiatric symptoms

## 2020-08-25 NOTE — Progress Notes (Signed)
Pt is prepped for and in and out catherization. Patient was cleaned and prepped in a sterle fashion with betadine. A 14 fr catheter foley was inserted. Urine return was note 150 ml purulent urine.  Performed by Lurline Hare  Urine sent culture

## 2020-08-29 LAB — URINE CULTURE

## 2020-08-30 ENCOUNTER — Telehealth: Payer: Self-pay

## 2020-08-30 NOTE — Telephone Encounter (Signed)
No answer. Left message to return call to office.  

## 2020-08-30 NOTE — Telephone Encounter (Signed)
-----   Message from Irine Seal, MD sent at 08/29/2020  5:19 PM EDT ----- Keflex should be fine.  ----- Message ----- From: Dorisann Frames, RN Sent: 08/29/2020   3:43 PM EDT To: Irine Seal, MD  Please advise= placed on cephALEXin (KEFLEX) 250 MG capsule 07/28

## 2020-09-18 DIAGNOSIS — R0902 Hypoxemia: Secondary | ICD-10-CM | POA: Diagnosis not present

## 2020-09-18 DIAGNOSIS — E162 Hypoglycemia, unspecified: Secondary | ICD-10-CM | POA: Diagnosis not present

## 2020-09-18 DIAGNOSIS — E161 Other hypoglycemia: Secondary | ICD-10-CM | POA: Diagnosis not present

## 2020-09-22 ENCOUNTER — Other Ambulatory Visit: Payer: Self-pay | Admitting: Family Medicine

## 2020-09-22 ENCOUNTER — Other Ambulatory Visit: Payer: Self-pay | Admitting: Nurse Practitioner

## 2020-09-22 DIAGNOSIS — M19049 Primary osteoarthritis, unspecified hand: Secondary | ICD-10-CM

## 2020-10-10 ENCOUNTER — Telehealth: Payer: Self-pay | Admitting: Family Medicine

## 2020-10-10 ENCOUNTER — Telehealth: Payer: Self-pay | Admitting: Internal Medicine

## 2020-10-10 ENCOUNTER — Other Ambulatory Visit: Payer: Self-pay | Admitting: *Deleted

## 2020-10-10 DIAGNOSIS — E1122 Type 2 diabetes mellitus with diabetic chronic kidney disease: Secondary | ICD-10-CM

## 2020-10-10 MED ORDER — LANTUS SOLOSTAR 100 UNIT/ML ~~LOC~~ SOPN: 20.0000 [IU] | PEN_INJECTOR | Freq: Every day | SUBCUTANEOUS | 3 refills | Status: AC

## 2020-10-10 NOTE — Telephone Encounter (Signed)
Medication sent to pt pharmacy 

## 2020-10-10 NOTE — Telephone Encounter (Signed)
Please send in Insulin Pt is Completely out .Marland Kitchen  This is the second time he has RUN OUT   The CNA are telling [name] that they are giving 1 time a day at 20 units.,  She is not sure what is going on ,.  [facility]

## 2020-10-13 NOTE — Telephone Encounter (Signed)
It is 2 early to have the insulin filled.  Per [name] [phone] the aides with Mr Botbyl, is advising that the medication is freezing, and the pen will not work.  I advised [name], that someone needs to check the fridge temp, and maybe place it in a bag so it does not freeze.  I also suggested that she talk to the Pharmacy about the out of pocket cost for the insulin, and to see if there was something they could do about it freezing, such as a replacement .

## 2020-10-14 NOTE — Telephone Encounter (Signed)
Called diane to let her know make sure that fridge is not at freezing temp and that even if sent in again insurance would not cover as he just picked it up she would need to call the pharmacy and ask about out of pocket cost with verbal understanding

## 2020-10-24 ENCOUNTER — Other Ambulatory Visit: Payer: Self-pay | Admitting: Nurse Practitioner

## 2020-10-24 ENCOUNTER — Other Ambulatory Visit: Payer: Self-pay | Admitting: Family Medicine

## 2020-10-24 DIAGNOSIS — M19049 Primary osteoarthritis, unspecified hand: Secondary | ICD-10-CM

## 2020-10-25 ENCOUNTER — Telehealth: Payer: Self-pay | Admitting: Internal Medicine

## 2020-10-25 ENCOUNTER — Telehealth: Payer: Self-pay | Admitting: Family Medicine

## 2020-10-25 ENCOUNTER — Ambulatory Visit: Payer: Medicare HMO | Admitting: Internal Medicine

## 2020-10-25 NOTE — Telephone Encounter (Signed)
Pt is in need of a Bed, his is broke.  He missed his last appt due to missed [place] ride

## 2020-10-25 NOTE — Telephone Encounter (Signed)
Pt will need in office appt

## 2020-10-31 ENCOUNTER — Ambulatory Visit: Payer: Medicare HMO

## 2020-10-31 ENCOUNTER — Other Ambulatory Visit: Payer: Self-pay

## 2020-11-01 ENCOUNTER — Ambulatory Visit: Payer: Medicare HMO | Admitting: Internal Medicine

## 2020-11-04 DIAGNOSIS — R69 Illness, unspecified: Secondary | ICD-10-CM | POA: Diagnosis not present

## 2020-11-22 ENCOUNTER — Other Ambulatory Visit: Payer: Self-pay | Admitting: Nurse Practitioner

## 2020-11-22 ENCOUNTER — Telehealth: Payer: Self-pay

## 2020-11-22 ENCOUNTER — Emergency Department (HOSPITAL_COMMUNITY): Payer: Medicare HMO

## 2020-11-22 ENCOUNTER — Other Ambulatory Visit: Payer: Self-pay | Admitting: Family Medicine

## 2020-11-22 ENCOUNTER — Encounter (HOSPITAL_COMMUNITY): Payer: Self-pay | Admitting: *Deleted

## 2020-11-22 ENCOUNTER — Other Ambulatory Visit: Payer: Self-pay

## 2020-11-22 ENCOUNTER — Emergency Department (HOSPITAL_COMMUNITY)
Admission: EM | Admit: 2020-11-22 | Discharge: 2020-11-22 | Disposition: A | Discharge status: Home / Self Care | Payer: Medicare HMO | Attending: Emergency Medicine | Admitting: Emergency Medicine

## 2020-11-22 ENCOUNTER — Encounter: Payer: Self-pay | Admitting: Internal Medicine

## 2020-11-22 ENCOUNTER — Ambulatory Visit (INDEPENDENT_AMBULATORY_CARE_PROVIDER_SITE_OTHER): Payer: Medicare HMO | Admitting: Internal Medicine

## 2020-11-22 VITALS — BP 142/60 | HR 66 | Temp 97.8°F | Resp 18 | Ht 71.0 in

## 2020-11-22 DIAGNOSIS — I739 Peripheral vascular disease, unspecified: Secondary | ICD-10-CM

## 2020-11-22 DIAGNOSIS — M19049 Primary osteoarthritis, unspecified hand: Secondary | ICD-10-CM

## 2020-11-22 DIAGNOSIS — E1122 Type 2 diabetes mellitus with diabetic chronic kidney disease: Secondary | ICD-10-CM | POA: Insufficient documentation

## 2020-11-22 DIAGNOSIS — I10 Essential (primary) hypertension: Secondary | ICD-10-CM | POA: Diagnosis not present

## 2020-11-22 DIAGNOSIS — Z79899 Other long term (current) drug therapy: Secondary | ICD-10-CM | POA: Insufficient documentation

## 2020-11-22 DIAGNOSIS — L97518 Non-pressure chronic ulcer of other part of right foot with other specified severity: Secondary | ICD-10-CM | POA: Diagnosis not present

## 2020-11-22 DIAGNOSIS — M79671 Pain in right foot: Secondary | ICD-10-CM | POA: Diagnosis not present

## 2020-11-22 DIAGNOSIS — Z7984 Long term (current) use of oral hypoglycemic drugs: Secondary | ICD-10-CM | POA: Diagnosis not present

## 2020-11-22 DIAGNOSIS — Z8616 Personal history of COVID-19: Secondary | ICD-10-CM | POA: Diagnosis not present

## 2020-11-22 DIAGNOSIS — I129 Hypertensive chronic kidney disease with stage 1 through stage 4 chronic kidney disease, or unspecified chronic kidney disease: Secondary | ICD-10-CM | POA: Insufficient documentation

## 2020-11-22 DIAGNOSIS — N183 Chronic kidney disease, stage 3 unspecified: Secondary | ICD-10-CM | POA: Insufficient documentation

## 2020-11-22 DIAGNOSIS — Z87891 Personal history of nicotine dependence: Secondary | ICD-10-CM | POA: Insufficient documentation

## 2020-11-22 DIAGNOSIS — L089 Local infection of the skin and subcutaneous tissue, unspecified: Secondary | ICD-10-CM | POA: Insufficient documentation

## 2020-11-22 DIAGNOSIS — M7989 Other specified soft tissue disorders: Secondary | ICD-10-CM | POA: Diagnosis not present

## 2020-11-22 DIAGNOSIS — Z7982 Long term (current) use of aspirin: Secondary | ICD-10-CM | POA: Diagnosis not present

## 2020-11-22 DIAGNOSIS — Z794 Long term (current) use of insulin: Secondary | ICD-10-CM | POA: Insufficient documentation

## 2020-11-22 LAB — BASIC METABOLIC PANEL
Anion gap: 6 (ref 5–15)
BUN: 26 mg/dL — ABNORMAL HIGH (ref 8–23)
CO2: 27 mmol/L (ref 22–32)
Calcium: 8.6 mg/dL — ABNORMAL LOW (ref 8.9–10.3)
Chloride: 106 mmol/L (ref 98–111)
Creatinine, Ser: 1.46 mg/dL — ABNORMAL HIGH (ref 0.61–1.24)
GFR, Estimated: 45 mL/min — ABNORMAL LOW (ref >60)
Glucose, Bld: 209 mg/dL — ABNORMAL HIGH (ref 70–99)
Potassium: 5.3 mmol/L — ABNORMAL HIGH (ref 3.5–5.1)
Sodium: 139 mmol/L (ref 135–145)

## 2020-11-22 LAB — CBC
HCT: 39.3 % (ref 39.0–52.0)
Hemoglobin: 12.4 g/dL — ABNORMAL LOW (ref 13.0–17.0)
MCH: 31.2 pg (ref 26.0–34.0)
MCHC: 31.6 g/dL (ref 30.0–36.0)
MCV: 98.7 fL (ref 80.0–100.0)
Platelets: 210 10*3/uL (ref 150–400)
RBC: 3.98 MIL/uL — ABNORMAL LOW (ref 4.22–5.81)
RDW: 12.4 % (ref 11.5–15.5)
WBC: 10.7 10*3/uL — ABNORMAL HIGH (ref 4.0–10.5)
nRBC: 0 % (ref 0.0–0.2)

## 2020-11-22 LAB — CBG MONITORING, ED
Glucose-Capillary: 190 mg/dL — ABNORMAL HIGH (ref 70–99)
Glucose-Capillary: 193 mg/dL — ABNORMAL HIGH (ref 70–99)

## 2020-11-22 MED ORDER — DOXYCYCLINE HYCLATE 100 MG PO CAPS: 100.0000 mg | ORAL_CAPSULE | Freq: Two times a day (BID) | ORAL | 0 refills | Status: DC

## 2020-11-22 NOTE — ED Triage Notes (Signed)
Sent from PCP for evaluation of right foot, foot warm to touch and patient denies any feeling in foot

## 2020-11-22 NOTE — Telephone Encounter (Signed)
[name] advised of pt foot conditions and she has reached out to [facility] program also wanted to know of a quick run down of what was stated at appt. Will call her back if you let me know.

## 2020-11-22 NOTE — Telephone Encounter (Signed)
Patient niece [name] returning a call to nurse. Call back # [phone].

## 2020-11-22 NOTE — Patient Instructions (Signed)
Please go to ER for evaluation of right foot wound/necrosis.

## 2020-11-22 NOTE — ED Provider Notes (Signed)
[facility] EMERGENCY DEPARTMENT Provider Note   CSN: [id number] Arrival date & time: 11/22/20  1352     History Chief Complaint  Patient presents with   Foot Pain    Mike Briggs is a 87 y.o. male.  Was sent over to the emergency department for some swelling in his foot.    The history is provided by the patient and medical records. No language interpreter was used.  Foot Pain This is a chronic problem. The current episode started more than 1 week ago. The problem occurs constantly. The problem has not changed since onset.Pertinent negatives include no chest pain, no abdominal pain and no headaches. Nothing aggravates the symptoms. Nothing relieves the symptoms. He has tried nothing for the symptoms.      Past Medical History:  Diagnosis Date   Acute cystitis 12/17/2018   Acute encephalopathy 11/14/2018   ANKLE, ARTHRITIS, DEGEN./OSTEO 12/16/2008   Qualifier: Diagnosis of  By: Aline Brochure MD, [place]     Arthritis    Cataract    Chronic kidney disease    COVID-19    COVID-19 virus detected 11/03/2018   COVID-19 virus infection 11/03/2018   Dehydration 11/02/2018   Depression    Diabetes mellitus    Gram-positive bacteremia 11/03/2018   Hyperlipidemia    Hypertension    Hypomagnesemia 03/24/2019   Klebsiella UTI  03/25/2019   Pain in finger of right hand 05/27/2019   Personal history of gout 05/22/2016   Rhabdomyolysis 11/02/2018   Stroke ([place])    Swelling of both hands 05/27/2019   Syncope 02/25/2018   TIA (transient ischemic attack) 11/14/2018   UTI (urinary tract infection) 02/26/2018   Vitamin D deficiency 05/31/2016    Patient Active Problem List   Diagnosis Date Noted   Dark urine 03/08/2020   Polyarthritis 12/10/2019   Hydrocele 05/27/2019   Mobitz type 1 second degree atrioventricular block    Sacral decubitus ulcer, stage III ([place]) 11/11/2018   Pressure injury of skin 11/08/2018   Essential hypertension 05/22/2016   Hemiparesis affecting right side as late  effect of cerebrovascular accident (CVA) ([place]) 05/22/2016   BPH (benign prostatic hyperplasia) 05/22/2016   Chronic constipation 05/22/2016   HLD (hyperlipidemia) 05/22/2016   Pseudophakia 12/06/2015   Vascular dementia ([place]) 10/23/2012   Chronic kidney disease, stage III (moderate) ([place]) 10/20/2012   Type 2 diabetes mellitus with diabetic chronic kidney disease ([place]) 12/16/2008    Past Surgical History:  Procedure Laterality Date   BACK SURGERY     cervical and lumbar   CATARACT EXTRACTION     CHOLECYSTECTOMY     FOOT SURGERY     5th toe    SPINE SURGERY     lumbar and cervical fusions       Family History  Family history unknown: Yes    Social History   Tobacco Use   Smoking status: Former    Types: Cigarettes    Quit date: 08/07/1967    Years since quitting: 53.3   Smokeless tobacco: Never  Vaping Use   Vaping Use: Never used  Substance Use Topics   Alcohol use: No   Drug use: No    Home Medications Prior to Admission medications   Medication Sig Start Date End Date Taking? Authorizing Provider  amLODipine (NORVASC) 10 MG tablet TAKE ONE TABLET BY MOUTH ONCE DAILY 11/22/20  Yes [name], MD  atorvastatin (LIPITOR) 10 MG tablet TAKE 1 TABLET BY MOUTH AFTER SUPPER. Patient taking differently: Take 10 mg by  mouth daily. 11/22/20  Yes [name], MD  doxycycline (VIBRAMYCIN) 100 MG capsule Take 1 capsule (100 mg total) by mouth 2 (two) times daily. One po bid x 7 days 11/22/20  Yes Milton Ferguson, MD  finasteride (PROSCAR) 5 MG tablet Take 1 tablet (5 mg total) by mouth daily. 12/04/19  Yes Perlie Mayo, NP  LANTUS SOLOSTAR 100 UNIT/ML Solostar Pen Inject 20 Units into the skin daily. 10/10/20  Yes [name], MD  lisinopril (ZESTRIL) 40 MG tablet TAKE (1) TABLET BY MOUTH ONCE DAILY. Patient taking differently: Take 40 mg by mouth daily. 11/22/20  Yes [name], MD  meloxicam (MOBIC) 7.5 MG tablet TAKE 1 TABLET BY MOUTH ONCE DAILY. 11/22/20   Yes [name], MD  memantine (NAMENDA) 10 MG tablet TAKE (1) TABLET BY MOUTH TWICE DAILY. 11/22/20  Yes [name], MD  mupirocin cream (BACTROBAN) 2 % Apply 1 application topically 2 (two) times daily. 04/05/20  Yes [name], MD  tamsulosin (FLOMAX) 0.4 MG CAPS capsule Take 1 capsule (0.4 mg total) by mouth daily after breakfast. 01/15/20  Yes Irine Seal, MD  aspirin 81 MG chewable tablet Chew 1 tablet (81 mg total) by mouth daily with breakfast. Patient not taking: No sig reported 12/04/19   Perlie Mayo, NP  blood glucose meter kit and supplies Dispense based on patient and insurance preference. Use up to four times daily as directed. (FOR ICD-10 E10.9, E11.9). 04/05/20   [name], MD  cephALEXin (KEFLEX) 250 MG capsule Take 1 capsule 3 x daily for a week and then take 1 po qhs with refills. Patient not taking: No sig reported 08/25/20   Irine Seal, MD  gabapentin (NEURONTIN) 100 MG capsule TAKE 1 CAPSULE BY MOUTH THREE TIMES A DAY. Patient not taking: No sig reported 07/25/20   Noreene Larsson, NP  SURE COMFORT PEN NEEDLES 31G X 5 MM MISC USE TO INJECT AS DIRECTED FOUR TIMES A DAY. WITH MEALS AND BEFORE BEDTIME. 08/17/20   [name], MD    Allergies    Patient has no known allergies.  Review of Systems   Review of Systems  Constitutional:  Negative for appetite change and fatigue.  HENT:  Negative for congestion, ear discharge and sinus pressure.   Eyes:  Negative for discharge.  Respiratory:  Negative for cough.   Cardiovascular:  Negative for chest pain.  Gastrointestinal:  Negative for abdominal pain and diarrhea.  Genitourinary:  Negative for frequency and hematuria.  Musculoskeletal:  Negative for back pain.  Skin:  Negative for rash.  Neurological:  Negative for seizures and headaches.  Psychiatric/Behavioral:  Negative for hallucinations.    Physical Exam Updated Vital Signs BP (!) 149/77 (BP Location: Right Arm)   Pulse 78   Temp 97.9 F (36.6  C) (Oral)   Resp 16   SpO2 100%   Physical Exam Vitals and nursing note reviewed.  Constitutional:      Appearance: He is well-developed.  HENT:     Head: Normocephalic.     Mouth/Throat:     Mouth: Mucous membranes are moist.  Eyes:     General: No scleral icterus.    Conjunctiva/sclera: Conjunctivae normal.  Neck:     Thyroid: No thyromegaly.  Cardiovascular:     Rate and Rhythm: Normal rate and regular rhythm.     Heart sounds: No murmur heard.   No friction rub. No gallop.  Pulmonary:     Breath  sounds: No stridor. No wheezing or rales.  Chest:     Chest wall: No tenderness.  Abdominal:     General: There is no distension.     Tenderness: There is no abdominal tenderness. There is no rebound.  Musculoskeletal:        General: Normal range of motion.     Cervical back: Neck supple.     Comments: Patient has very dry feet bilaterally swelling to both feet.  Decreased sensation right big toe but good capillary refill dorsalis pedis 1+.  Lymphadenopathy:     Cervical: No cervical adenopathy.  Skin:    Findings: No erythema or rash.  Neurological:     Mental Status: He is alert and oriented to person, place, and time.     Motor: No abnormal muscle tone.     Coordination: Coordination normal.  Psychiatric:        Behavior: Behavior normal.    ED Results / Procedures / Treatments   Labs (all labs ordered are listed, but only abnormal results are displayed) Labs Reviewed  BASIC METABOLIC PANEL - Abnormal; Notable for the following components:      Result Value   Potassium 5.3 (*)    Glucose, Bld 209 (*)    BUN 26 (*)    Creatinine, Ser 1.46 (*)    Calcium 8.6 (*)    GFR, Estimated 45 (*)    All other components within normal limits  CBC - Abnormal; Notable for the following components:   WBC 10.7 (*)    RBC 3.98 (*)    Hemoglobin 12.4 (*)    All other components within normal limits  CBG MONITORING, ED - Abnormal; Notable for the following components:    Glucose-Capillary 190 (*)    All other components within normal limits  CBG MONITORING, ED - Abnormal; Notable for the following components:   Glucose-Capillary 193 (*)    All other components within normal limits    EKG None  Radiology DG Foot Complete Right  Result Date: 11/22/2020 CLINICAL DATA:  Sore on bottom of foot EXAM: RIGHT FOOT COMPLETE - 3+ VIEW COMPARISON:  Right foot radiographs 02/12/2010 FINDINGS: There is significant osseous erosion/destruction involving the great toe proximal phalanx. The remainder of the bones are diffusely demineralized without other evidence of osseous destruction. Postsurgical changes in the little toe are again seen. There is degenerative change of the great toe tarsometatarsal joint, midfoot, and ankle. Calcifications of the Achilles tendon are again seen suggesting Achilles tendinopathy. There is diffuse soft tissue swelling about the foot. No soft tissue gas or radiopaque foreign body is seen. IMPRESSION: 1. Osseous destruction involving the great toe proximal phalanx is concerning for osteomyelitis. No other evidence of osseous destruction. 2. Diffuse soft tissue swelling about the foot. Electronically Signed   By: Valetta Mole M.D.   On: 11/22/2020 15:20    Procedures Procedures   Medications Ordered in ED Medications - No data to display  ED Course  I have reviewed the triage vital signs and the nursing notes.  Pertinent labs & imaging results that were available during my care of the patient were reviewed by me and considered in my medical decision making (see chart for details).    MDM Rules/Calculators/A&P                           Patient with minimal swelling to right foot possible infection he will be placed on some doxycycline.  X-ray shows obstruction in his right great toe.  We will refer him to podiatry Final Clinical Impression(s) / ED Diagnoses Final diagnoses:  Skin infection    Rx / DC Orders ED Discharge Orders           Ordered    doxycycline (VIBRAMYCIN) 100 MG capsule  2 times daily        11/22/20 1720             Milton Ferguson, MD 11/22/20 1733

## 2020-11-22 NOTE — Discharge Instructions (Signed)
Call Dr. Arvil Persons office tomorrow to set up an appointment either for this week or beginning next week.  Bring him a copy of the paperwork from this visit to look at

## 2020-11-22 NOTE — Progress Notes (Signed)
Established Patient Office Visit  Subjective:  Patient ID: Mike Briggs, male    DOB: [birth year]  Age: 87 y.o. MRN: [id number]  CC:  Chief Complaint  Patient presents with   Follow-up    Follow up has sore on right foot has been there since 09-22-20     HPI KOURTNEY MONTESINOS presents for follow-up of DM and complaint of right foot wound.  He has chronic right foot ulcer around the heel area.  He also has blackish right great toe.  He has chronic crusting and dryness of the right foot.  Denies any recent injury.  He has home health aide, who change his dressing over the right foot area.  Of note, upon removal of dressing today, crusting from the foot was noticed.  He does not have any sensation in the foot and it was cool to the touch.  DM: His HbA1C was 6.8.  He has been receiving Lantus 18 units now.  His blood glucose has been well controlled at home.  He has a hospital bed at home, which is not functioning properly and needs a new hospital bed.  Past Medical History:  Diagnosis Date   Acute cystitis 12/17/2018   Acute encephalopathy 11/14/2018   ANKLE, ARTHRITIS, DEGEN./OSTEO 12/16/2008   Qualifier: Diagnosis of  By: Aline Brochure MD, Dorothyann Peng     Arthritis    Cataract    Chronic kidney disease    COVID-19    COVID-19 virus detected 11/03/2018   COVID-19 virus infection 11/03/2018   Dehydration 11/02/2018   Depression    Diabetes mellitus    Gram-positive bacteremia 11/03/2018   Hyperlipidemia    Hypertension    Hypomagnesemia 03/24/2019   Klebsiella UTI  03/25/2019   Pain in finger of right hand 05/27/2019   Personal history of gout 05/22/2016   Rhabdomyolysis 11/02/2018   Stroke ([place])    Swelling of both hands 05/27/2019   Syncope 02/25/2018   TIA (transient ischemic attack) 11/14/2018   UTI (urinary tract infection) 02/26/2018   Vitamin D deficiency 05/31/2016    Past Surgical History:  Procedure Laterality Date   BACK SURGERY     cervical and lumbar   CATARACT  EXTRACTION     CHOLECYSTECTOMY     FOOT SURGERY     5th toe    SPINE SURGERY     lumbar and cervical fusions    Family History  Family history unknown: Yes    Social History   Socioeconomic History   Marital status: Divorced    Spouse name: Not on file   Number of children: 3   Years of education: 10   Highest education level: Not on file  Occupational History   Occupation: Retired.  Tobacco Use   Smoking status: Former    Types: Cigarettes    Quit date: 08/07/1967    Years since quitting: 53.3   Smokeless tobacco: Never  Vaping Use   Vaping Use: Never used  Substance and Sexual Activity   Alcohol use: No   Drug use: No   Sexual activity: Not Currently  Other Topics Concern   Not on file  Social History Narrative   Wheelchair bound.Has not walked in many years, at least five years.  Lives alone. Has 2 aides that come in to help him.   Lives alone.    Retired.   Eats all food groups.    Has 3 children but does not have contact with them.   Divorced.   Social  Determinants of Health   Financial Resource Strain: Not on file  Food Insecurity: Not on file  Transportation Needs: Not on file  Physical Activity: Not on file  Stress: Not on file  Social Connections: Not on file  Intimate Partner Violence: Not on file    Outpatient Medications Prior to Visit  Medication Sig Dispense Refill   amLODipine (NORVASC) 10 MG tablet TAKE ONE TABLET BY MOUTH ONCE DAILY 30 tablet 0   atorvastatin (LIPITOR) 10 MG tablet TAKE 1 TABLET BY MOUTH AFTER SUPPER. (Patient taking differently: Take 10 mg by mouth daily.) 30 tablet 0   blood glucose meter kit and supplies Dispense based on patient and insurance preference. Use up to four times daily as directed. (FOR ICD-10 E10.9, E11.9). 1 each 0   cephALEXin (KEFLEX) 250 MG capsule Take 1 capsule 3 x daily for a week and then take 1 po qhs with refills. (Patient not taking: No sig reported) 30 capsule 5   finasteride (PROSCAR) 5 MG  tablet Take 1 tablet (5 mg total) by mouth daily. 90 tablet 3   gabapentin (NEURONTIN) 100 MG capsule TAKE 1 CAPSULE BY MOUTH THREE TIMES A DAY. (Patient not taking: No sig reported) 90 capsule 0   LANTUS SOLOSTAR 100 UNIT/ML Solostar Pen Inject 20 Units into the skin daily. 15 mL 3   lisinopril (ZESTRIL) 40 MG tablet TAKE (1) TABLET BY MOUTH ONCE DAILY. (Patient taking differently: Take 40 mg by mouth daily.) 30 tablet 0   meloxicam (MOBIC) 7.5 MG tablet TAKE 1 TABLET BY MOUTH ONCE DAILY. 30 tablet 0   memantine (NAMENDA) 10 MG tablet TAKE (1) TABLET BY MOUTH TWICE DAILY. 60 tablet 0   mupirocin cream (BACTROBAN) 2 % Apply 1 application topically 2 (two) times daily. 15 g 0   SURE COMFORT PEN NEEDLES 31G X 5 MM MISC USE TO INJECT AS DIRECTED FOUR TIMES A DAY. WITH MEALS AND BEFORE BEDTIME. 100 each 0   tamsulosin (FLOMAX) 0.4 MG CAPS capsule Take 1 capsule (0.4 mg total) by mouth daily after breakfast. 90 capsule 3   aspirin 81 MG chewable tablet Chew 1 tablet (81 mg total) by mouth daily with breakfast. (Patient not taking: No sig reported) 30 tablet 3   vitamin C (VITAMIN C) 500 MG tablet Take 1 tablet (500 mg total) by mouth daily. (Patient not taking: Reported on 11/22/2020)     zinc sulfate 220 (50 Zn) MG capsule Take 1 capsule (220 mg total) by mouth daily. (Patient not taking: Reported on 11/22/2020)     No facility-administered medications prior to visit.    No Known Allergies  ROS Review of Systems  Constitutional:  Positive for fatigue. Negative for chills and fever.  HENT:  Negative for congestion and sore throat.   Eyes:  Negative for pain and discharge.  Respiratory:  Negative for cough and shortness of breath.   Cardiovascular:  Negative for chest pain and palpitations.  Gastrointestinal:  Negative for constipation, diarrhea, nausea and vomiting.  Endocrine: Negative for polydipsia and polyuria.  Genitourinary:  Negative for dysuria and hematuria.  Musculoskeletal:   Positive for arthralgias. Negative for neck pain and neck stiffness.  Skin:  Positive for wound. Negative for rash.  Neurological:  Positive for weakness (B/l LE) and numbness (R foot). Negative for dizziness and headaches.  Psychiatric/Behavioral:  Negative for agitation and behavioral problems.      Objective:    Physical Exam Vitals reviewed.  Constitutional:      General: He  is not in acute distress.    Appearance: He is not diaphoretic.  HENT:     Head: Normocephalic and atraumatic.     Nose: Nose normal.     Mouth/Throat:     Mouth: Mucous membranes are moist.  Eyes:     General: No scleral icterus.    Extraocular Movements: Extraocular movements intact.     Pupils: Pupils are equal, round, and reactive to light.  Cardiovascular:     Rate and Rhythm: Normal rate and regular rhythm.     Heart sounds: Normal heart sounds. No murmur heard. Pulmonary:     Breath sounds: Normal breath sounds. No wheezing or rales.  Abdominal:     Palpations: Abdomen is soft.     Tenderness: There is no abdominal tenderness.  Musculoskeletal:     Cervical back: Neck supple. No tenderness.     Right lower leg: No edema.     Left lower leg: No edema.  Skin:    General: Skin is warm.     Comments: Right foot wound - likely necrosis near great toe, blackish discoloration, no palpable DPA pulse, no sensation over right foot Right heel superficial ulcer, with granulation tissue Yellowish discoloration on the borders  Neurological:     General: No focal deficit present.     Mental Status: He is alert and oriented to person, place, and time.     Sensory: Sensory deficit (R foot) present.     Motor: Weakness (B/l LE) present.  Psychiatric:        Mood and Affect: Mood normal.        Behavior: Behavior normal.    BP (!) 142/60 (BP Location: Right Arm, Patient Position: Sitting, Cuff Size: Normal)   Pulse 66   Temp 97.8 F (36.6 C) (Oral)   Resp 18   Ht $R'5\' 11"'Lq$  (1.803 m)   SpO2 91%   BMI  31.39 kg/m  Wt Readings from Last 3 Encounters:  02/04/20 225 lb 1.4 oz (102.1 kg)  01/15/20 225 lb (102.1 kg)  12/10/19 225 lb (102.1 kg)     Health Maintenance Due  Topic Date Due   COVID-19 Vaccine (1) Never done   Zoster Vaccines- Shingrix (1 of 2) Never done   OPHTHALMOLOGY EXAM  12/25/2016   FOOT EXAM  12/25/2017   INFLUENZA VACCINE  08/29/2020   HEMOGLOBIN A1C  09/05/2020    There are no preventive care reminders to display for this patient.  Lab Results  Component Value Date   TSH 0.849 12/18/2018   Lab Results  Component Value Date   WBC 10.7 (H) 11/22/2020   HGB 12.4 (L) 11/22/2020   HCT 39.3 11/22/2020   MCV 98.7 11/22/2020   PLT 210 11/22/2020   Lab Results  Component Value Date   NA 139 11/22/2020   K 5.3 (H) 11/22/2020   CO2 27 11/22/2020   GLUCOSE 209 (H) 11/22/2020   BUN 26 (H) 11/22/2020   CREATININE 1.46 (H) 11/22/2020   BILITOT 0.5 02/04/2020   ALKPHOS 109 02/04/2020   AST 12 (L) 02/04/2020   ALT 13 02/04/2020   PROT 7.3 02/04/2020   ALBUMIN 2.9 (L) 02/04/2020   CALCIUM 8.6 (L) 11/22/2020   ANIONGAP 6 11/22/2020   Lab Results  Component Value Date   CHOL 146 11/14/2018   Lab Results  Component Value Date   HDL 28 (L) 11/14/2018   Lab Results  Component Value Date   LDLCALC 94 11/14/2018   Lab Results  Component Value Date   TRIG 120 11/14/2018   Lab Results  Component Value Date   CHOLHDL 5.2 11/14/2018   Lab Results  Component Value Date   HGBA1C 6.8 (A) 03/08/2020   HGBA1C 6.8 03/08/2020      Assessment & Plan:   Problem List Items Addressed This Visit       Cardiovascular and Mediastinum   Essential hypertension    BP Readings from Last 1 Encounters:  11/22/20 (!) 148/60  Elevated today On Lisinopril 40 mg QD and Amlodipine 10 mg QD Counseled for compliance with the medications      PVD (peripheral vascular disease) ([facility])    On aspirin and statin Foot ulcer and concern for necrosed area near of right  great toe, referred to ER        Endocrine   Type 2 diabetes mellitus with diabetic chronic kidney disease ([place])    Lab Results  Component Value Date   HGBA1C 6.8 (A) 03/08/2020   HGBA1C 6.8 03/08/2020  On Lantus to 18 U QD Goal HbA1C for him would be between 7.0-8.0        Other   Foot ulcer ([place]) - Primary    Likely due to type II DM Referred to ER as he has blackish discoloration around great toe area, concern for necrosis as he has no sensation of foot and cold to touch with no palpable DPA pulse      Patient was referred to ER for further eval of foot ulcer and wound. Discussed with ER Physician.  No orders of the defined types were placed in this encounter.   Follow-up: Return in about 2 weeks (around 12/06/2020).    [name], MD

## 2020-11-23 IMAGING — DX DG WRIST COMPLETE 3+V*L*
4 series · 4 of 4 positions shown · non-contrast
Comparison: None.

CLINICAL DATA: Bilateral wrist pain and swelling.  No known injury.

EXAM:
LEFT WRIST - COMPLETE 3+ VIEW

[wrist pa]
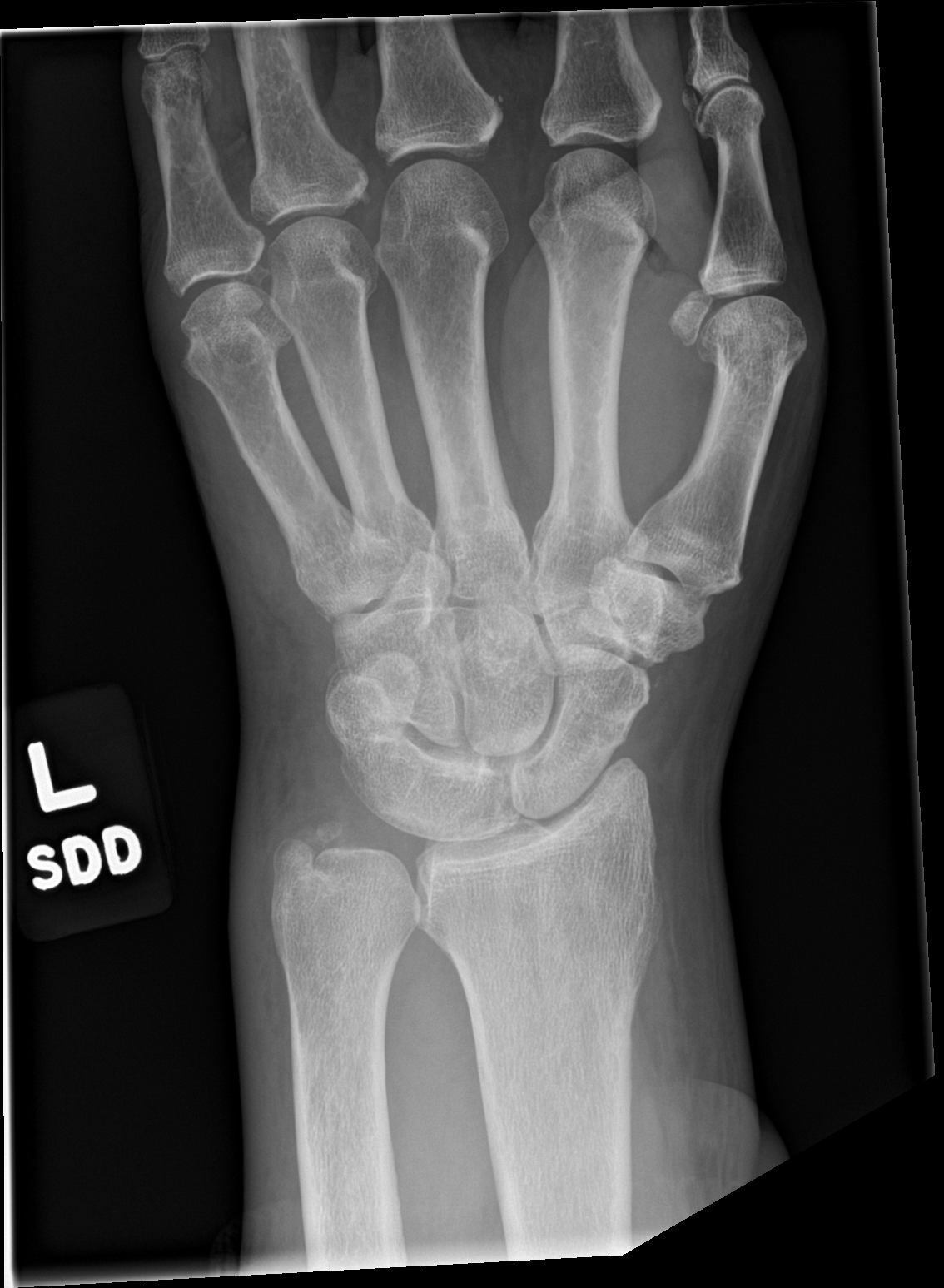

[wrist obl]
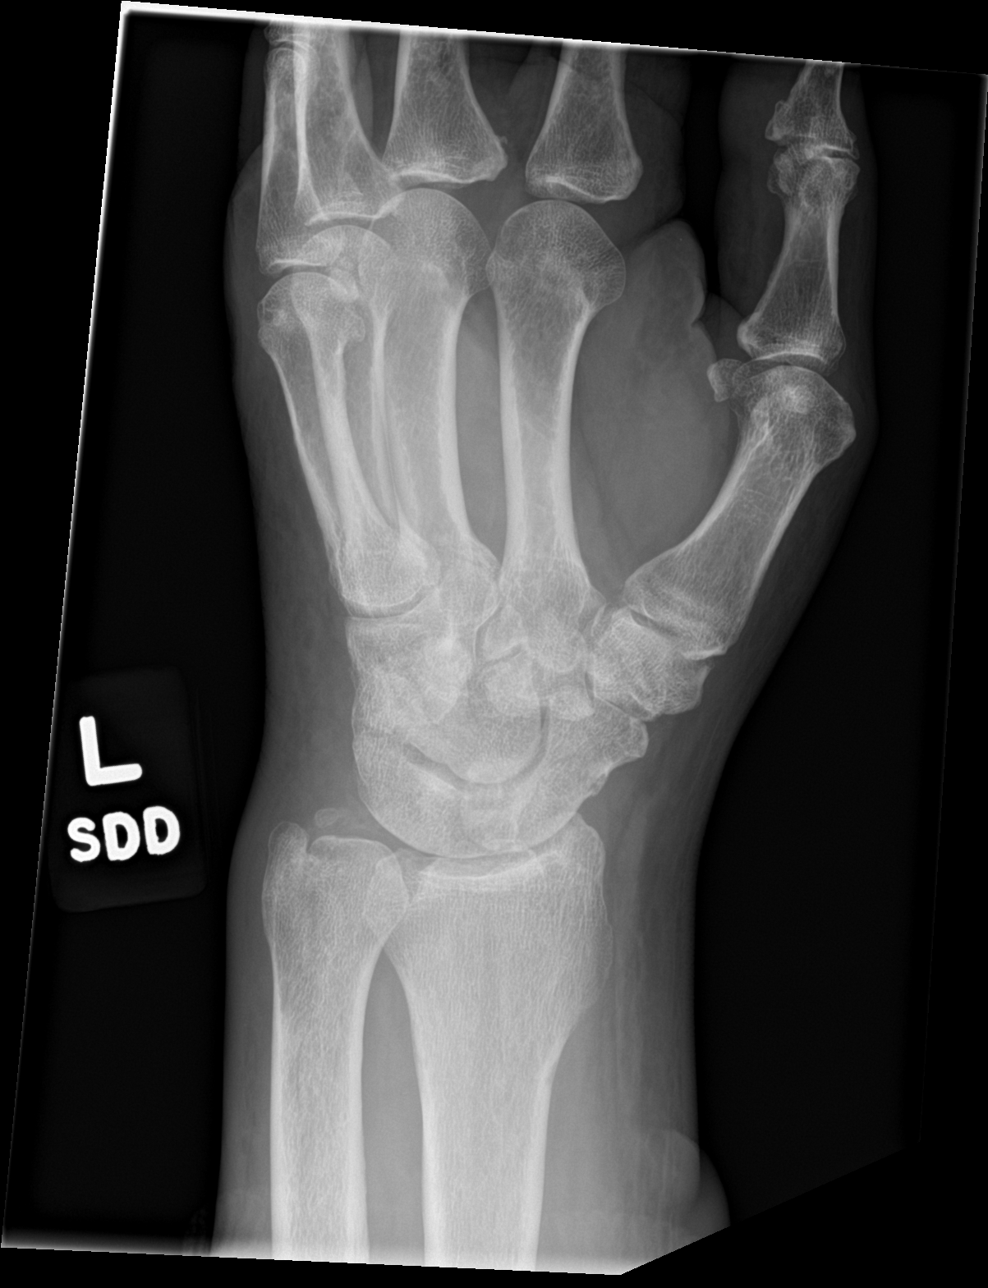

[wrist lat]
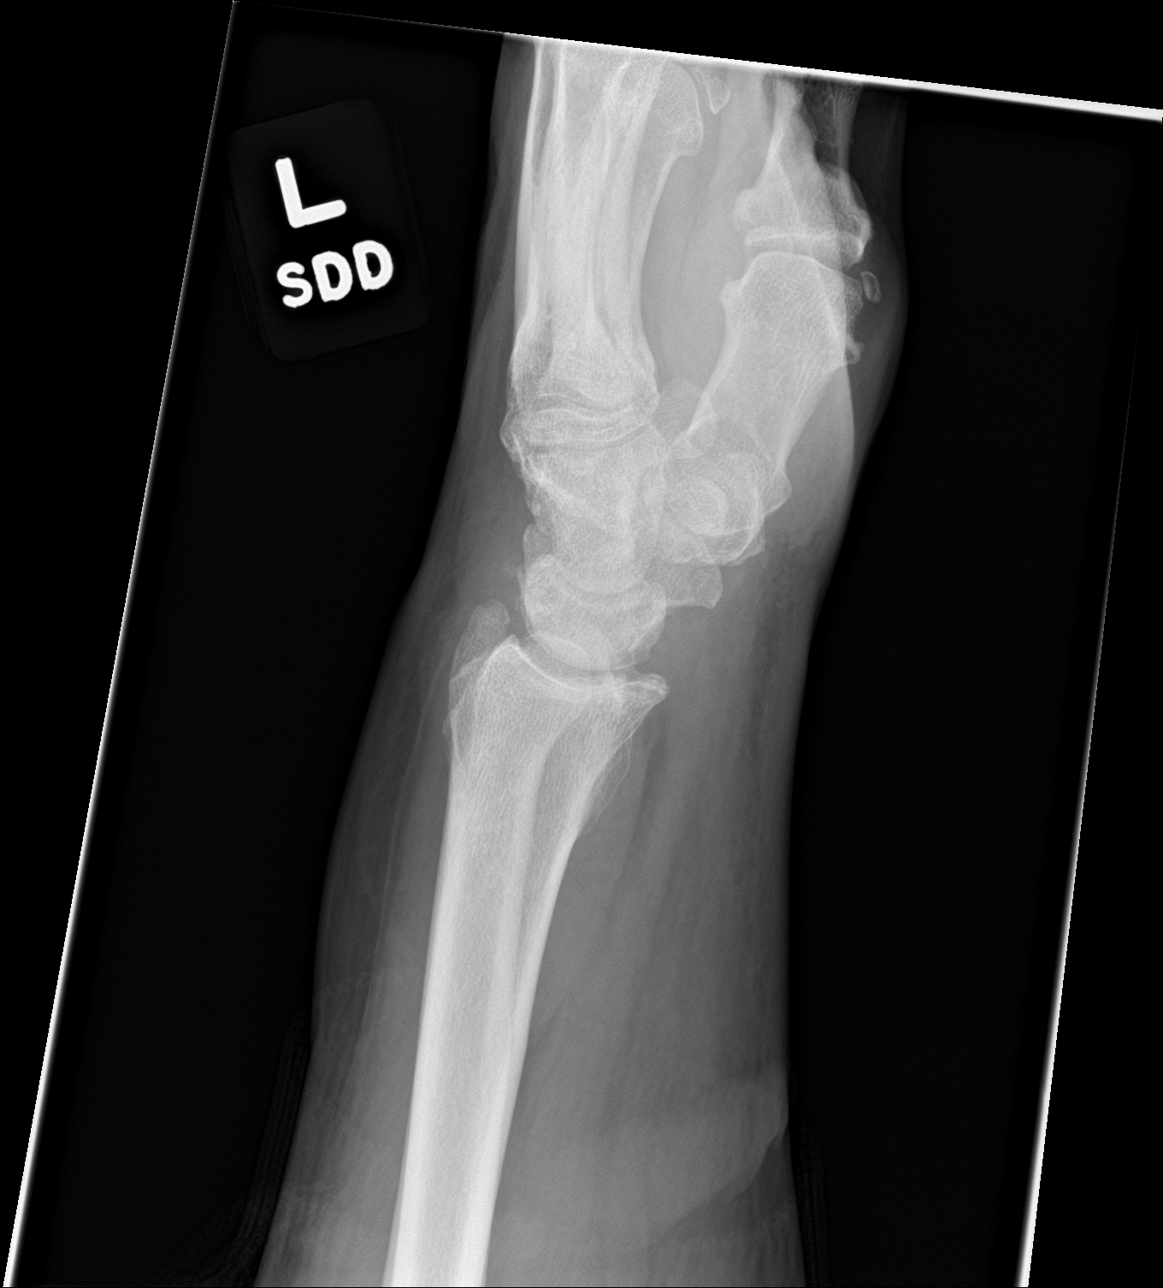

[wrist navicular]
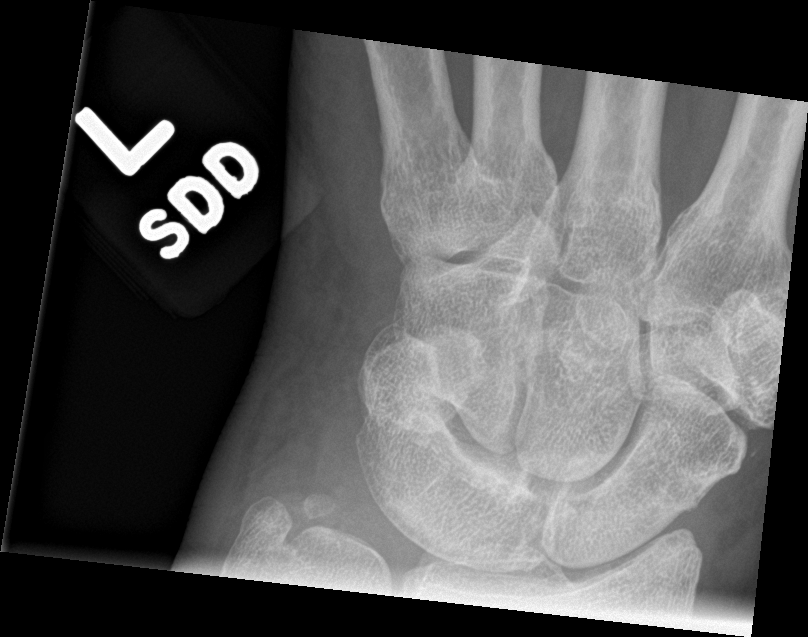

[4 of 4 positions shown; findings below may reference images not displayed]

FINDINGS: The mineralization and alignment are normal. There is no evidence of
acute fracture or dislocation. Lunotriquetral coalition noted
incidentally. There are mild intercarpal and metacarpal phalangeal
degenerative changes. No erosive changes are identified. Possible
diffuse soft tissue swelling, greater than that seen on the opposite
side. No evidence of foreign body or soft tissue emphysema.
IMPRESSION: Nonspecific soft tissue prominence, possibly swelling. No acute
osseous findings or evidence of osteomyelitis.

## 2020-11-23 IMAGING — DX DG WRIST COMPLETE 3+V*R*
4 series · 4 of 4 positions shown · non-contrast
Comparison: None.

CLINICAL DATA: Bilateral wrist pain and swelling.  No known injury.

EXAM:
RIGHT WRIST - COMPLETE 3+ VIEW

[wrist pa]
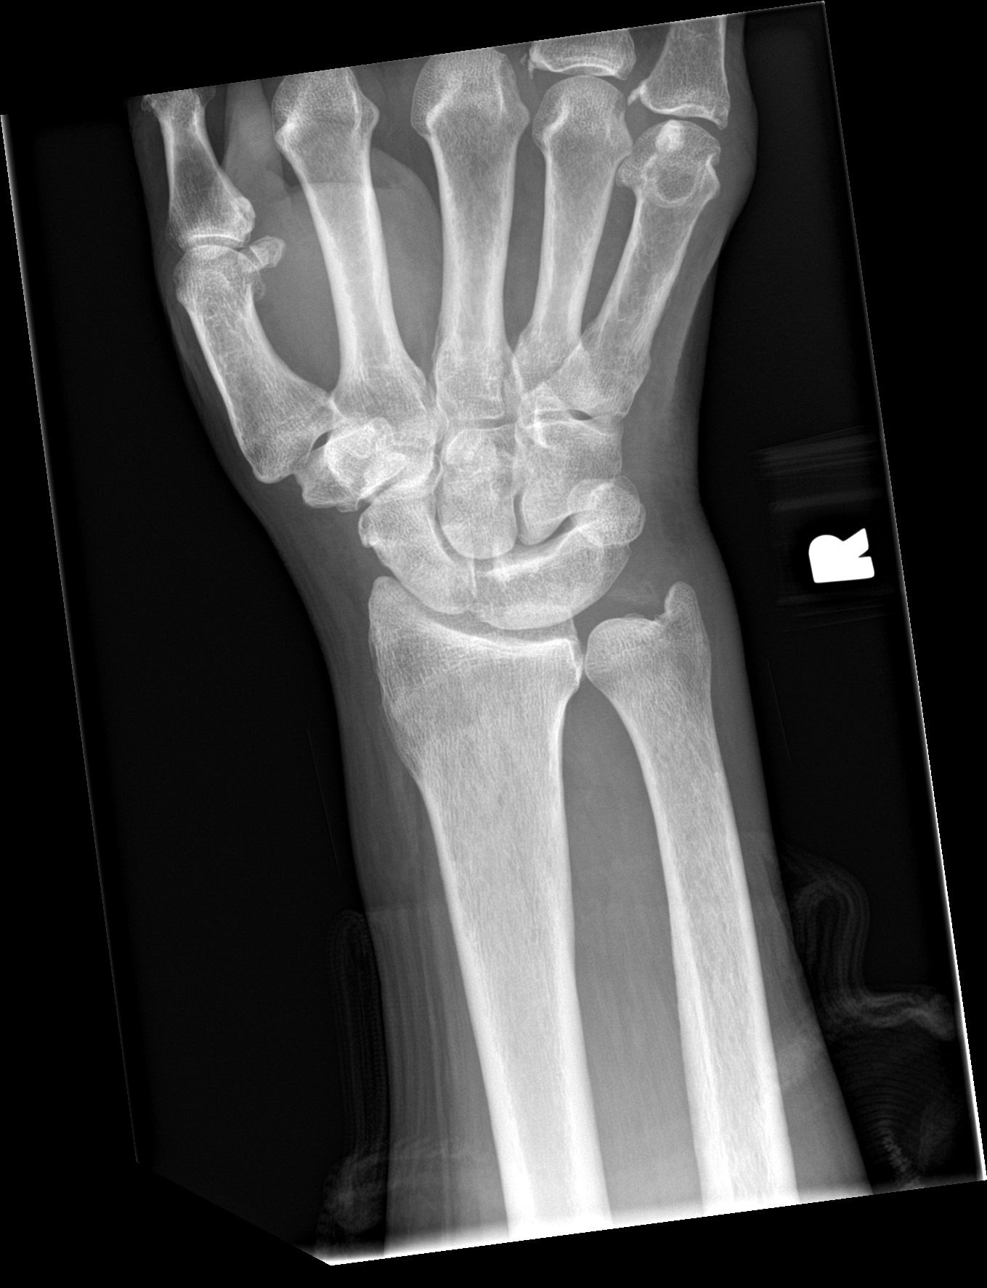

[wrist obl]
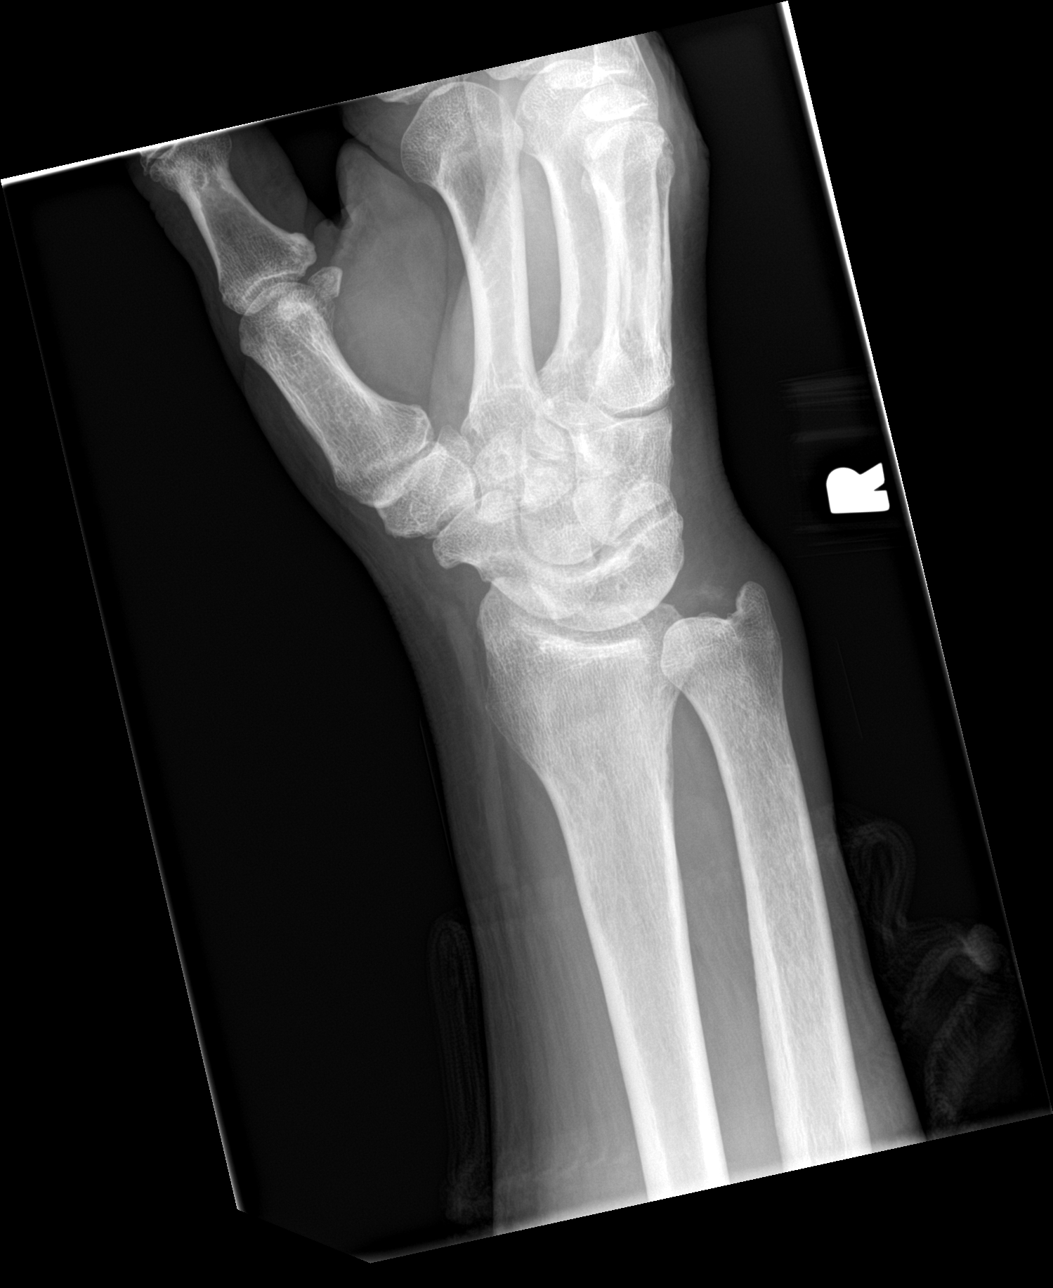

[wrist lat]
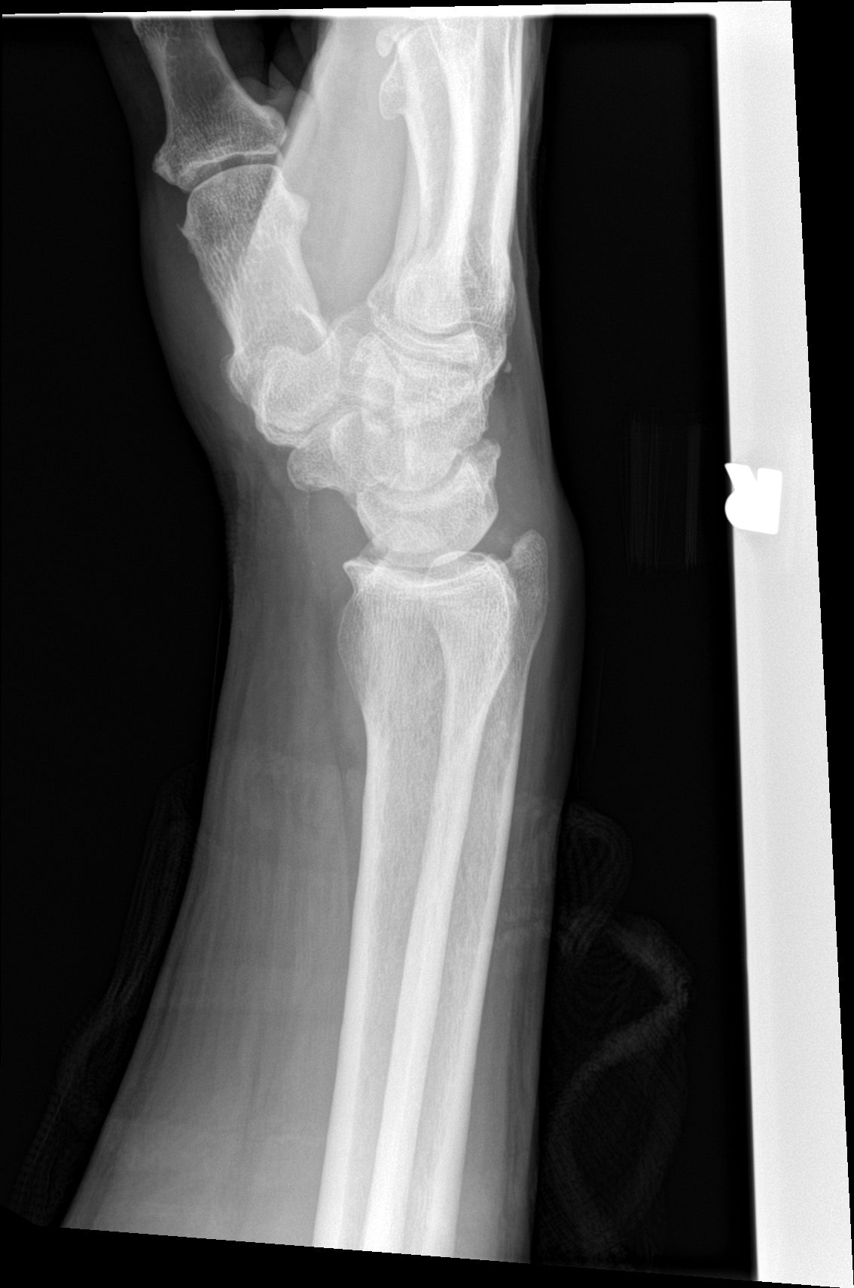

[wrist navicular]
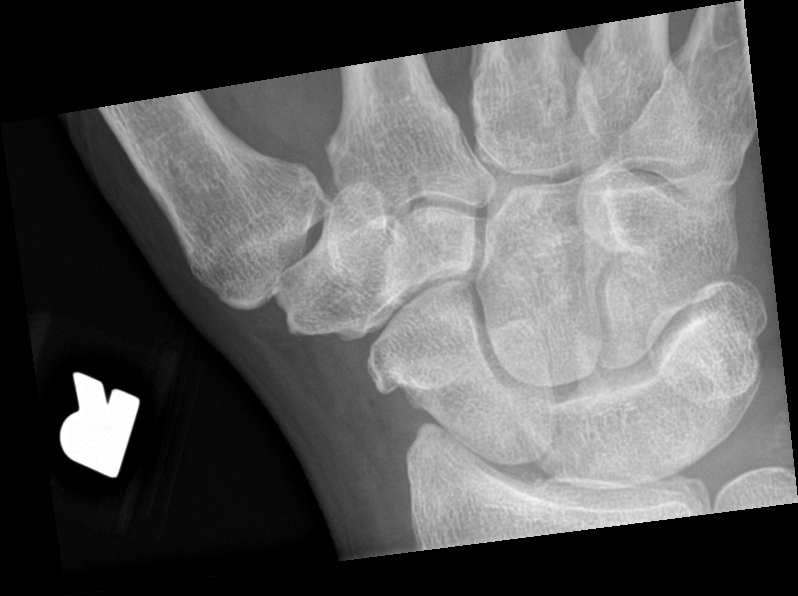

[4 of 4 positions shown; findings below may reference images not displayed]

FINDINGS: The mineralization and alignment are normal. There is no evidence of
acute fracture or dislocation. Lunotriquetral coalition noted
incidentally. There are minimal intercarpal and metacarpal
phalangeal degenerative changes. No erosive changes or bone
destruction identified. Possible mild ulnar soft tissue swelling.
IMPRESSION: No acute osseous findings or radiographic evidence of osteomyelitis.
Possible mild ulnar soft tissue swelling.

## 2020-11-23 NOTE — Telephone Encounter (Signed)
Spoke with Dianne and let her know the ER sent him home last night with no referrals pt has 2 week follow up appt coming up

## 2020-11-24 ENCOUNTER — Ambulatory Visit: Payer: Medicare HMO | Admitting: Urology

## 2020-11-24 ENCOUNTER — Other Ambulatory Visit: Payer: Self-pay

## 2020-11-24 ENCOUNTER — Telehealth: Payer: Self-pay | Admitting: *Deleted

## 2020-11-24 ENCOUNTER — Ambulatory Visit: Payer: Medicare HMO

## 2020-11-24 DIAGNOSIS — L97509 Non-pressure chronic ulcer of other part of unspecified foot with unspecified severity: Secondary | ICD-10-CM | POA: Insufficient documentation

## 2020-11-24 DIAGNOSIS — I739 Peripheral vascular disease, unspecified: Secondary | ICD-10-CM | POA: Insufficient documentation

## 2020-11-24 NOTE — Assessment & Plan Note (Signed)
Lab Results  Component Value Date   HGBA1C 6.8 (A) 03/08/2020   HGBA1C 6.8 03/08/2020   On Lantus to 18 U QD Goal HbA1C for him would be between 7.0-8.0

## 2020-11-24 NOTE — Assessment & Plan Note (Signed)
Likely due to type II DM Referred to ER as he has blackish discoloration around great toe area, concern for necrosis as he has no sensation of foot and cold to touch with no palpable DPA pulse

## 2020-11-24 NOTE — Telephone Encounter (Signed)
Pt needs a bed his bed is broke can you addend note and let me know when finished I will send order over to [facility]

## 2020-11-24 NOTE — Telephone Encounter (Signed)
[name] advised with verbal understanding if they do not hear from podiatry to let us know and we would refer out

## 2020-11-24 NOTE — Assessment & Plan Note (Signed)
BP Readings from Last 1 Encounters:  11/22/20 (!) 148/60   Elevated today On Lisinopril 40 mg QD and Amlodipine 10 mg QD Counseled for compliance with the medications

## 2020-11-24 NOTE — Assessment & Plan Note (Signed)
On aspirin and statin Foot ulcer and concern for necrosed area near of right great toe, referred to ER

## 2020-11-28 ENCOUNTER — Other Ambulatory Visit: Payer: Self-pay | Admitting: *Deleted

## 2020-11-28 DIAGNOSIS — I69351 Hemiplegia and hemiparesis following cerebral infarction affecting right dominant side: Secondary | ICD-10-CM

## 2020-11-28 DIAGNOSIS — L89619 Pressure ulcer of right heel, unspecified stage: Secondary | ICD-10-CM

## 2020-11-28 DIAGNOSIS — L89153 Pressure ulcer of sacral region, stage 3: Secondary | ICD-10-CM

## 2020-11-28 NOTE — Telephone Encounter (Signed)
Faxed to [facility] 

## 2020-11-30 NOTE — Telephone Encounter (Signed)
Mike Briggs from [place], they are out of network for the hospital bed with patient insurance, will need to send into another dme company.

## 2020-12-02 ENCOUNTER — Other Ambulatory Visit: Payer: Self-pay | Admitting: *Deleted

## 2020-12-02 ENCOUNTER — Telehealth: Payer: Self-pay | Admitting: Internal Medicine

## 2020-12-02 DIAGNOSIS — M130 Polyarthritis, unspecified: Secondary | ICD-10-CM

## 2020-12-02 DIAGNOSIS — L89619 Pressure ulcer of right heel, unspecified stage: Secondary | ICD-10-CM

## 2020-12-02 DIAGNOSIS — L89153 Pressure ulcer of sacral region, stage 3: Secondary | ICD-10-CM

## 2020-12-02 NOTE — Telephone Encounter (Signed)
Pt bed order faxed to [facility]

## 2020-12-02 NOTE — Telephone Encounter (Signed)
Dominica with [facility] called in on behalf of patient for   Visit notes  A bed narritive  Height / weight.  Call back [place] [phone]  Fax # [phone]

## 2020-12-06 ENCOUNTER — Encounter (INDEPENDENT_AMBULATORY_CARE_PROVIDER_SITE_OTHER): Payer: Self-pay

## 2020-12-06 ENCOUNTER — Other Ambulatory Visit: Payer: Self-pay

## 2020-12-06 ENCOUNTER — Ambulatory Visit (INDEPENDENT_AMBULATORY_CARE_PROVIDER_SITE_OTHER): Payer: Medicare HMO | Admitting: Internal Medicine

## 2020-12-06 ENCOUNTER — Encounter: Payer: Self-pay | Admitting: Internal Medicine

## 2020-12-06 VITALS — BP 138/71 | HR 63 | Resp 18 | Ht 71.0 in

## 2020-12-06 DIAGNOSIS — E782 Mixed hyperlipidemia: Secondary | ICD-10-CM

## 2020-12-06 DIAGNOSIS — N183 Chronic kidney disease, stage 3 unspecified: Secondary | ICD-10-CM

## 2020-12-06 DIAGNOSIS — Z23 Encounter for immunization: Secondary | ICD-10-CM | POA: Diagnosis not present

## 2020-12-06 DIAGNOSIS — I10 Essential (primary) hypertension: Secondary | ICD-10-CM

## 2020-12-06 DIAGNOSIS — F01B0 Vascular dementia, moderate, without behavioral disturbance, psychotic disturbance, mood disturbance, and anxiety: Secondary | ICD-10-CM

## 2020-12-06 DIAGNOSIS — I69351 Hemiplegia and hemiparesis following cerebral infarction affecting right dominant side: Secondary | ICD-10-CM

## 2020-12-06 DIAGNOSIS — L97518 Non-pressure chronic ulcer of other part of right foot with other specified severity: Secondary | ICD-10-CM

## 2020-12-06 DIAGNOSIS — E1122 Type 2 diabetes mellitus with diabetic chronic kidney disease: Secondary | ICD-10-CM | POA: Diagnosis not present

## 2020-12-06 MED ORDER — BLOOD GLUCOSE METER KIT: PACK | 0 refills | Status: DC

## 2020-12-06 NOTE — Patient Instructions (Signed)
Please continue to take medications as prescribed.  Please keep foot ulcer area clean and dry.  Please apply lotion to help with dry skin.  Please use glucometer to check blood glucose at least once in a day.

## 2020-12-06 NOTE — Assessment & Plan Note (Signed)
On statin Check lipid profile 

## 2020-12-06 NOTE — Assessment & Plan Note (Signed)
On aspirin and statin No recent change in neurologic function

## 2020-12-06 NOTE — Assessment & Plan Note (Signed)
BP Readings from Last 1 Encounters:  12/06/20 138/71   Well-controlled On Lisinopril 40 mg QD and Amlodipine 10 mg QD Counseled for compliance with the medications

## 2020-12-06 NOTE — Assessment & Plan Note (Signed)
Lab Results  Component Value Date   HGBA1C 6.8 (A) 03/08/2020   HGBA1C 6.8 03/08/2020   On Lantus to 18 U QD On statin Advised to follow diabetic diet F/u CMP and lipid panel Diabetic foot exam: Today Diabetic eye exam: Advised to follow up with Ophthalmology for diabetic eye exam  Goal HbA1C for him would be between 7.0-8.0

## 2020-12-06 NOTE — Assessment & Plan Note (Signed)
On Namenda Dependent for ADLs, has home health aide

## 2020-12-06 NOTE — Telephone Encounter (Signed)
Faxed back to [place] at [facility]

## 2020-12-06 NOTE — Progress Notes (Addendum)
Established Patient Office Visit  Subjective:  Patient ID: Mike Briggs, male    DOB: [birth year]  Age: 87 y.o. MRN: [id number]  CC:  Chief Complaint  Patient presents with   Follow-up    2 week follow up pt has been feeling ok    HPI Mike Briggs is a 87 y.o. male with past medical history of HTN, DM with CKD stage 3, Mobitz type 1 second degree AV block, dementia and BPH who presents for follow up of his chronic medical conditions.  He was recently seen in the office for right foot ulcer and was referred to ER for concern for necrosis.  He was discharged with doxycycline from ER and has not been able to see a podiatrist since then.  Denies any local discharge.  Denies any local warmth or erythema.  He has a home health aide, who has been cleaning the ulcer area.  HTN: BP is well-controlled. Takes medications regularly. Patient denies headache, dizziness, chest pain, dyspnea or palpitations.  Type II DM: He takes Lantus 18 units, but has not been checking blood glucose.  His home health aide has been helping with insulin at home.  CKD stage III: Denies any dysuria or hematuria.  He takes tamsulosin and finasteride for BPH.  He follows up with Dr. [name] for BPH.  Dementia: He takes Namenda for it.  No recent behavioral problem reported.  He received flu vaccine in the office today.  Past Medical History:  Diagnosis Date   Acute cystitis 12/17/2018   Acute encephalopathy 11/14/2018   ANKLE, ARTHRITIS, DEGEN./OSTEO 12/16/2008   Qualifier: Diagnosis of  By: Aline Brochure MD, Dorothyann Peng     Arthritis    Cataract    Chronic kidney disease    COVID-19    COVID-19 virus detected 11/03/2018   COVID-19 virus infection 11/03/2018   Dehydration 11/02/2018   Depression    Diabetes mellitus    Gram-positive bacteremia 11/03/2018   Hyperlipidemia    Hypertension    Hypomagnesemia 03/24/2019   Klebsiella UTI  03/25/2019   Pain in finger of right hand 05/27/2019   Personal history of gout  05/22/2016   Rhabdomyolysis 11/02/2018   Stroke ([place])    Swelling of both hands 05/27/2019   Syncope 02/25/2018   TIA (transient ischemic attack) 11/14/2018   UTI (urinary tract infection) 02/26/2018   Vitamin D deficiency 05/31/2016    Past Surgical History:  Procedure Laterality Date   BACK SURGERY     cervical and lumbar   CATARACT EXTRACTION     CHOLECYSTECTOMY     FOOT SURGERY     5th toe    SPINE SURGERY     lumbar and cervical fusions    Family History  Family history unknown: Yes    Social History   Socioeconomic History   Marital status: Divorced    Spouse name: Not on file   Number of children: 3   Years of education: 10   Highest education level: Not on file  Occupational History   Occupation: Retired.  Tobacco Use   Smoking status: Former    Types: Cigarettes    Quit date: 08/07/1967    Years since quitting: 53.3   Smokeless tobacco: Never  Vaping Use   Vaping Use: Never used  Substance and Sexual Activity   Alcohol use: No   Drug use: No   Sexual activity: Not Currently  Other Topics Concern   Not on file  Social History Narrative   Wheelchair  bound.Has not walked in many years, at least five years.  Lives alone. Has 2 aides that come in to help him.   Lives alone.    Retired.   Eats all food groups.    Has 3 children but does not have contact with them.   Divorced.   Social Determinants of Health   Financial Resource Strain: Not on file  Food Insecurity: Not on file  Transportation Needs: Not on file  Physical Activity: Not on file  Stress: Not on file  Social Connections: Not on file  Intimate Partner Violence: Not on file    Outpatient Medications Prior to Visit  Medication Sig Dispense Refill   amLODipine (NORVASC) 10 MG tablet TAKE ONE TABLET BY MOUTH ONCE DAILY 30 tablet 0   aspirin 81 MG chewable tablet Chew 1 tablet (81 mg total) by mouth daily with breakfast. 30 tablet 3   atorvastatin (LIPITOR) 10 MG tablet TAKE 1 TABLET BY MOUTH  AFTER SUPPER. (Patient taking differently: Take 10 mg by mouth daily.) 30 tablet 0   blood glucose meter kit and supplies Dispense based on patient and insurance preference. Use up to four times daily as directed. (FOR ICD-10 E10.9, E11.9). 1 each 0   finasteride (PROSCAR) 5 MG tablet Take 1 tablet (5 mg total) by mouth daily. 90 tablet 3   gabapentin (NEURONTIN) 100 MG capsule TAKE 1 CAPSULE BY MOUTH THREE TIMES A DAY. 90 capsule 0   LANTUS SOLOSTAR 100 UNIT/ML Solostar Pen Inject 20 Units into the skin daily. 15 mL 3   lisinopril (ZESTRIL) 40 MG tablet TAKE (1) TABLET BY MOUTH ONCE DAILY. (Patient taking differently: Take 40 mg by mouth daily.) 30 tablet 0   meloxicam (MOBIC) 7.5 MG tablet TAKE 1 TABLET BY MOUTH ONCE DAILY. 30 tablet 0   memantine (NAMENDA) 10 MG tablet TAKE (1) TABLET BY MOUTH TWICE DAILY. 60 tablet 0   mupirocin cream (BACTROBAN) 2 % Apply 1 application topically 2 (two) times daily. 15 g 0   SURE COMFORT PEN NEEDLES 31G X 5 MM MISC USE TO INJECT AS DIRECTED FOUR TIMES A DAY. WITH MEALS AND BEFORE BEDTIME. 100 each 0   tamsulosin (FLOMAX) 0.4 MG CAPS capsule Take 1 capsule (0.4 mg total) by mouth daily after breakfast. 90 capsule 3   cephALEXin (KEFLEX) 250 MG capsule Take 1 capsule 3 x daily for a week and then take 1 po qhs with refills. 30 capsule 5   doxycycline (VIBRAMYCIN) 100 MG capsule Take 1 capsule (100 mg total) by mouth 2 (two) times daily. One po bid x 7 days 20 capsule 0   No facility-administered medications prior to visit.    No Known Allergies  ROS Review of Systems  Constitutional:  Negative for chills and fever.  HENT:  Negative for congestion and sore throat.   Eyes:  Negative for pain and discharge.  Respiratory:  Negative for cough and shortness of breath.   Cardiovascular:  Negative for chest pain and palpitations.  Gastrointestinal:  Negative for constipation, diarrhea, nausea and vomiting.  Endocrine: Negative for polydipsia and polyuria.   Genitourinary:  Negative for dysuria and hematuria.  Musculoskeletal:  Positive for arthralgias. Negative for neck pain and neck stiffness.  Skin:  Positive for wound. Negative for rash.  Neurological:  Positive for weakness (B/l LE) and numbness (R foot). Negative for dizziness and headaches.  Psychiatric/Behavioral:  Negative for agitation and behavioral problems.      Objective:    Physical Exam Vitals  reviewed.  Constitutional:      General: He is not in acute distress.    Appearance: He is not diaphoretic.  HENT:     Head: Normocephalic and atraumatic.     Nose: Nose normal.     Mouth/Throat:     Mouth: Mucous membranes are moist.  Eyes:     General: No scleral icterus.    Extraocular Movements: Extraocular movements intact.  Cardiovascular:     Rate and Rhythm: Normal rate and regular rhythm.     Heart sounds: Normal heart sounds. No murmur heard. Pulmonary:     Breath sounds: Normal breath sounds. No wheezing or rales.  Abdominal:     Palpations: Abdomen is soft.     Tenderness: There is no abdominal tenderness.  Musculoskeletal:     Cervical back: Neck supple. No tenderness.     Right lower leg: No edema.     Left lower leg: No edema.  Skin:    General: Skin is warm.     Comments: Right foot wound - Right heel superficial ulcer, with granulation tissue Yellowish discoloration on the borders Blackish discoloration near right great toe   Neurological:     General: No focal deficit present.     Mental Status: He is alert and oriented to person, place, and time.     Sensory: Sensory deficit (R foot) present.     Motor: Weakness (B/l LE) present.  Psychiatric:        Mood and Affect: Mood normal.        Behavior: Behavior normal.    BP 138/71 (BP Location: Left Arm, Patient Position: Sitting, Cuff Size: Normal)   Pulse 63   Resp 18   Ht $R'5\' 11"'QX$  (1.803 m)   SpO2 98%   BMI 31.39 kg/m  Wt Readings from Last 3 Encounters:  02/04/20 225 lb 1.4 oz (102.1 kg)   01/15/20 225 lb (102.1 kg)  12/10/19 225 lb (102.1 kg)    Lab Results  Component Value Date   TSH 0.849 12/18/2018   Lab Results  Component Value Date   WBC 10.7 (H) 11/22/2020   HGB 12.4 (L) 11/22/2020   HCT 39.3 11/22/2020   MCV 98.7 11/22/2020   PLT 210 11/22/2020   Lab Results  Component Value Date   NA 139 11/22/2020   K 5.3 (H) 11/22/2020   CO2 27 11/22/2020   GLUCOSE 209 (H) 11/22/2020   BUN 26 (H) 11/22/2020   CREATININE 1.46 (H) 11/22/2020   BILITOT 0.5 02/04/2020   ALKPHOS 109 02/04/2020   AST 12 (L) 02/04/2020   ALT 13 02/04/2020   PROT 7.3 02/04/2020   ALBUMIN 2.9 (L) 02/04/2020   CALCIUM 8.6 (L) 11/22/2020   ANIONGAP 6 11/22/2020   Lab Results  Component Value Date   CHOL 146 11/14/2018   Lab Results  Component Value Date   HDL 28 (L) 11/14/2018   Lab Results  Component Value Date   LDLCALC 94 11/14/2018   Lab Results  Component Value Date   TRIG 120 11/14/2018   Lab Results  Component Value Date   CHOLHDL 5.2 11/14/2018   Lab Results  Component Value Date   HGBA1C 6.8 (A) 03/08/2020   HGBA1C 6.8 03/08/2020      Assessment & Plan:   Problem List Items Addressed This Visit       Cardiovascular and Mediastinum   Essential hypertension    BP Readings from Last 1 Encounters:  12/06/20 138/71  Well-controlled On Lisinopril  40 mg QD and Amlodipine 10 mg QD Counseled for compliance with the medications      Relevant Orders   CBC with Differential/Platelet     Endocrine   Type 2 diabetes mellitus with diabetic chronic kidney disease ([place]) - Primary    Lab Results  Component Value Date   HGBA1C 6.8 (A) 03/08/2020   HGBA1C 6.8 03/08/2020  On Lantus to 18 U QD On statin Advised to follow diabetic diet F/u CMP and lipid panel Diabetic foot exam: Today Diabetic eye exam: Advised to follow up with Ophthalmology for diabetic eye exam  Goal HbA1C for him would be between 7.0-8.0      Relevant Orders   CMP14+EGFR   HgB A1c      Nervous and Auditory   Vascular dementia ([place])    On Namenda Dependent for ADLs, has home health aide      Relevant Orders   CBC with Differential/Platelet   Hemiparesis affecting right side as late effect of cerebrovascular accident (CVA) ([place])    On aspirin and statin No recent change in neurologic function        Other   HLD (hyperlipidemia)    On statin Check lipid profile      Relevant Orders   Lipid Profile   Foot ulcer ([place])    Has foot ulcer on right heel Refer to podiatry Keep area clean and dry      Relevant Orders   CBC with Differential/Platelet   Ambulatory referral to Podiatry   Need for hospital bed Has h/o hemiparesis due to CVA, has chronic pressure ulcer over foot - unable to independently move due to CVA effects, has limited support at home Needs frequent positioning due to h/o pressure ulcers and needs assistance from bed to wheelchair Needs elevated head of bed > 30 degrees due to chronic leg swelling due to comorbidities, like venous insufficiency and HFpEF     Meds ordered this encounter  Medications   blood glucose meter kit and supplies    Sig: Dispense based on patient and insurance preference. Use up to four times daily as directed. (FOR ICD-10 E10.9, E11.9).    Dispense:  1 each    Refill:  0    Order Specific Question:   Number of strips    Answer:   100    Order Specific Question:   Number of lancets    Answer:   100    Follow-up: Return in about 3 months (around 03/08/2021) for HTN and DM.    [name], MD

## 2020-12-06 NOTE — Assessment & Plan Note (Signed)
Has foot ulcer on right heel Refer to podiatry Keep area clean and dry

## 2020-12-07 LAB — CMP14+EGFR
ALT: 8 IU/L (ref 0–44)
AST: 11 IU/L (ref 0–40)
Albumin/Globulin Ratio: 1 — ABNORMAL LOW (ref 1.2–2.2)
Albumin: 3.3 g/dL — ABNORMAL LOW (ref 3.5–4.6)
Alkaline Phosphatase: 157 IU/L — ABNORMAL HIGH (ref 44–121)
BUN/Creatinine Ratio: 17 (ref 10–24)
BUN: 27 mg/dL (ref 10–36)
Bilirubin Total: 0.3 mg/dL (ref 0.0–1.2)
CO2: 23 mmol/L (ref 20–29)
Calcium: 8.6 mg/dL (ref 8.6–10.2)
Chloride: 101 mmol/L (ref 96–106)
Creatinine, Ser: 1.58 mg/dL — ABNORMAL HIGH (ref 0.76–1.27)
Globulin, Total: 3.4 g/dL (ref 1.5–4.5)
Glucose: 219 mg/dL — ABNORMAL HIGH (ref 70–99)
Potassium: 4.9 mmol/L (ref 3.5–5.2)
Sodium: 138 mmol/L (ref 134–144)
Total Protein: 6.7 g/dL (ref 6.0–8.5)
eGFR: 41 mL/min/{1.73_m2} — ABNORMAL LOW (ref >59)

## 2020-12-07 LAB — CBC WITH DIFFERENTIAL/PLATELET
Basophils Absolute: 0.1 10*3/uL (ref 0.0–0.2)
Basos: 1 %
EOS (ABSOLUTE): 0.3 10*3/uL (ref 0.0–0.4)
Eos: 3 %
Hematocrit: 35 % — ABNORMAL LOW (ref 37.5–51.0)
Hemoglobin: 12 g/dL — ABNORMAL LOW (ref 13.0–17.7)
Immature Grans (Abs): 0 10*3/uL (ref 0.0–0.1)
Immature Granulocytes: 0 %
Lymphocytes Absolute: 1.8 10*3/uL (ref 0.7–3.1)
Lymphs: 19 %
MCH: 31.2 pg (ref 26.6–33.0)
MCHC: 34.3 g/dL (ref 31.5–35.7)
MCV: 91 fL (ref 79–97)
Monocytes Absolute: 0.7 10*3/uL (ref 0.1–0.9)
Monocytes: 8 %
Neutrophils Absolute: 6.7 10*3/uL (ref 1.4–7.0)
Neutrophils: 69 %
Platelets: 241 10*3/uL (ref 150–450)
RBC: 3.85 x10E6/uL — ABNORMAL LOW (ref 4.14–5.80)
RDW: 11.7 % (ref 11.6–15.4)
WBC: 9.6 10*3/uL (ref 3.4–10.8)

## 2020-12-07 LAB — LIPID PANEL
Chol/HDL Ratio: 3.7 ratio (ref 0.0–5.0)
Cholesterol, Total: 138 mg/dL (ref 100–199)
HDL: 37 mg/dL — ABNORMAL LOW (ref >39)
LDL Chol Calc (NIH): 83 mg/dL (ref 0–99)
Triglycerides: 93 mg/dL (ref 0–149)
VLDL Cholesterol Cal: 18 mg/dL (ref 5–40)

## 2020-12-07 LAB — HEMOGLOBIN A1C
Est. average glucose Bld gHb Est-mCnc: 148 mg/dL
Hgb A1c MFr Bld: 6.8 % — ABNORMAL HIGH (ref 4.8–5.6)

## 2020-12-20 ENCOUNTER — Other Ambulatory Visit: Payer: Self-pay | Admitting: Family Medicine

## 2020-12-20 ENCOUNTER — Other Ambulatory Visit: Payer: Self-pay | Admitting: Internal Medicine

## 2020-12-20 DIAGNOSIS — M19049 Primary osteoarthritis, unspecified hand: Secondary | ICD-10-CM

## 2020-12-21 ENCOUNTER — Other Ambulatory Visit: Payer: Self-pay | Admitting: Internal Medicine

## 2020-12-28 ENCOUNTER — Ambulatory Visit (INDEPENDENT_AMBULATORY_CARE_PROVIDER_SITE_OTHER): Payer: Medicare HMO

## 2020-12-28 ENCOUNTER — Other Ambulatory Visit: Payer: Self-pay

## 2020-12-28 ENCOUNTER — Ambulatory Visit (INDEPENDENT_AMBULATORY_CARE_PROVIDER_SITE_OTHER): Payer: Medicare HMO | Admitting: Podiatry

## 2020-12-28 DIAGNOSIS — M79671 Pain in right foot: Secondary | ICD-10-CM | POA: Diagnosis not present

## 2020-12-28 DIAGNOSIS — M79672 Pain in left foot: Secondary | ICD-10-CM

## 2020-12-28 NOTE — Progress Notes (Signed)
Subjective:  Patient ID: Mike Briggs, male    DOB: 08-18-1928,  MRN: [id number]  Chief Complaint  Patient presents with   Wound Check    Right foot wound left foot hurt when pt fell     87 y.o. male presents for wound care.  Patient presents with complaint of right heel medial aspect superficial wound.  Patient states that this has been going for quite some time they wanted to make sure there is not deeper than on get infected and lose her leg.  He also had a fall in the parking lot due to generalized weakness to both lower extremity.  He states that he may have twisted the foot.  They wanted the left foot to get it evaluated.  It is not hurting him but he just wants to make sure he did not break anything.   Review of Systems: Negative except as noted in the HPI. Denies N/V/F/Ch.  Past Medical History:  Diagnosis Date   Acute cystitis 12/17/2018   Acute encephalopathy 11/14/2018   ANKLE, ARTHRITIS, DEGEN./OSTEO 12/16/2008   Qualifier: Diagnosis of  By: Aline Brochure MD, Dorothyann Peng     Arthritis    Cataract    Chronic kidney disease    COVID-19    COVID-19 virus detected 11/03/2018   COVID-19 virus infection 11/03/2018   Dehydration 11/02/2018   Depression    Diabetes mellitus    Gram-positive bacteremia 11/03/2018   Hyperlipidemia    Hypertension    Hypomagnesemia 03/24/2019   Klebsiella UTI  03/25/2019   Pain in finger of right hand 05/27/2019   Personal history of gout 05/22/2016   Rhabdomyolysis 11/02/2018   Stroke ([place])    Swelling of both hands 05/27/2019   Syncope 02/25/2018   TIA (transient ischemic attack) 11/14/2018   UTI (urinary tract infection) 02/26/2018   Vitamin D deficiency 05/31/2016    Current Outpatient Medications:    amLODipine (NORVASC) 10 MG tablet, TAKE ONE TABLET BY MOUTH ONCE DAILY, Disp: 30 tablet, Rfl: 0   aspirin 81 MG chewable tablet, Chew 1 tablet (81 mg total) by mouth daily with breakfast., Disp: 30 tablet, Rfl: 3   atorvastatin (LIPITOR) 10 MG tablet,  TAKE 1 TABLET BY MOUTH AFTER SUPPER., Disp: 30 tablet, Rfl: 0   blood glucose meter kit and supplies, Dispense based on patient and insurance preference. Use up to four times daily as directed. (FOR ICD-10 E10.9, E11.9)., Disp: 1 each, Rfl: 0   blood glucose meter kit and supplies, Dispense based on patient and insurance preference. Use up to four times daily as directed. (FOR ICD-10 E10.9, E11.9)., Disp: 1 each, Rfl: 0   cephALEXin (KEFLEX) 250 MG capsule, Take 1 capsule 3 x daily for a week and then take 1 po qhs with refills., Disp: 30 capsule, Rfl: 5   finasteride (PROSCAR) 5 MG tablet, Take 1 tablet (5 mg total) by mouth daily., Disp: 90 tablet, Rfl: 3   gabapentin (NEURONTIN) 100 MG capsule, TAKE 1 CAPSULE BY MOUTH THREE TIMES A DAY., Disp: 90 capsule, Rfl: 0   LANTUS SOLOSTAR 100 UNIT/ML Solostar Pen, Inject 20 Units into the skin daily., Disp: 15 mL, Rfl: 3   lisinopril (ZESTRIL) 40 MG tablet, TAKE (1) TABLET BY MOUTH ONCE DAILY., Disp: 30 tablet, Rfl: 0   meloxicam (MOBIC) 7.5 MG tablet, TAKE 1 TABLET BY MOUTH ONCE DAILY., Disp: 30 tablet, Rfl: 0   memantine (NAMENDA) 10 MG tablet, TAKE (1) TABLET BY MOUTH TWICE DAILY., Disp: 60 tablet, Rfl: 0  mupirocin cream (BACTROBAN) 2 %, Apply 1 application topically 2 (two) times daily., Disp: 15 g, Rfl: 0   SURE COMFORT PEN NEEDLES 31G X 5 MM MISC, USE TO INJECT AS DIRECTED FOUR TIMES A DAY. WITH MEALS AND BEFORE BEDTIME., Disp: 100 each, Rfl: 0   tamsulosin (FLOMAX) 0.4 MG CAPS capsule, Take 1 capsule (0.4 mg total) by mouth daily after breakfast., Disp: 90 capsule, Rfl: 3  Social History   Tobacco Use  Smoking Status Former   Types: Cigarettes   Quit date: 08/07/1967   Years since quitting: 53.4  Smokeless Tobacco Never    No Known Allergies Objective:  There were no vitals filed for this visit. There is no height or weight on file to calculate BMI. Constitutional Well developed. Well nourished.  Vascular Dorsalis pedis pulses palpable  bilaterally. Posterior tibial pulses palpable bilaterally. Capillary refill normal to all digits.  No cyanosis or clubbing noted. Pedal hair growth normal.  Neurologic Normal speech. Oriented to person, place, and time. Protective sensation absent  Dermatologic Wound Location: Right medial wound limited to the breakdown of skin.  No purulent drainage noted.  Granular wound bed noted.  No malodor or bone present. Wound Base: Granular/Healthy Peri-wound: Normal Exudate: Scant/small amount Serous exudate Wound Measurements: -See below  Orthopedic: No areas of tenderness or soreness noted to the left foot.  No gross deformity noted to the left foot.  No calf pain noted.   Radiographs: 3 views of skeletally mature adult bilateral foot: Diffuse osteopenia noted generalized across both foot and ankle.  No acute breaks or fractures noted.  Multiple areas of arthritis noted. Assessment:   1. Pain in both feet    Plan:  Patient was evaluated and treated and all questions answered.  Left foot injury -I explained to the patient the etiology of injury and various treatment was discussed.  Given that he does not have any pain at this moment I believe we will continue with conservative treatment options.  He has not dislocated his foot and ankle.  I will see him back as needed for this.  Ulcer right heel limited to the breakdown of the skin -Debridement as below. -Dressed with triple antibiotic Band-Aid, DSD. -Continue off-loading with surgical shoe.  Procedure: Excisional Debridement of Wound Tool: Sharp chisel blade/tissue nipper Rationale: Removal of non-viable soft tissue from the wound to promote healing.  Anesthesia: none Pre-Debridement Wound Measurements: 0.5 cm x 0.7 cm x 0.3 cm  Post-Debridement Wound Measurements: 0.6 cm x 0.8 cm x 0.3 cm  Type of Debridement: Sharp Excisional Tissue Removed: Non-viable soft tissue Blood loss: Minimal (<50cc) Depth of Debridement: subcutaneous  tissue. Technique: Sharp excisional debridement to bleeding, viable wound base.  Wound Progress: This is my initial evaluation I will continue monitor the progression of the wound. Site healing conversation 7 Dressing: Dry, sterile, compression dressing. Disposition: Patient tolerated procedure well. Patient to return in 1 week for follow-up.  No follow-ups on file.

## 2021-01-19 ENCOUNTER — Other Ambulatory Visit: Payer: Self-pay | Admitting: Urology

## 2021-01-19 ENCOUNTER — Other Ambulatory Visit: Payer: Self-pay | Admitting: Internal Medicine

## 2021-01-19 DIAGNOSIS — M19049 Primary osteoarthritis, unspecified hand: Secondary | ICD-10-CM

## 2021-01-19 DIAGNOSIS — R339 Retention of urine, unspecified: Secondary | ICD-10-CM

## 2021-01-25 ENCOUNTER — Other Ambulatory Visit: Payer: Self-pay | Admitting: Internal Medicine

## 2021-01-26 ENCOUNTER — Encounter (HOSPITAL_COMMUNITY): Payer: Self-pay

## 2021-01-26 ENCOUNTER — Other Ambulatory Visit: Payer: Self-pay

## 2021-01-26 ENCOUNTER — Observation Stay (HOSPITAL_COMMUNITY)
Admission: EM | Admit: 2021-01-26 | Discharge: 2021-01-28 | Disposition: A | Discharge status: Home / Self Care | Payer: Medicare HMO | Attending: Internal Medicine | Admitting: Internal Medicine

## 2021-01-26 ENCOUNTER — Emergency Department (HOSPITAL_COMMUNITY): Payer: Medicare HMO

## 2021-01-26 DIAGNOSIS — I10 Essential (primary) hypertension: Secondary | ICD-10-CM

## 2021-01-26 DIAGNOSIS — I4430 Unspecified atrioventricular block: Secondary | ICD-10-CM | POA: Diagnosis not present

## 2021-01-26 DIAGNOSIS — N1831 Chronic kidney disease, stage 3a: Secondary | ICD-10-CM

## 2021-01-26 DIAGNOSIS — F0150 Vascular dementia without behavioral disturbance: Secondary | ICD-10-CM | POA: Diagnosis not present

## 2021-01-26 DIAGNOSIS — I441 Atrioventricular block, second degree: Secondary | ICD-10-CM | POA: Insufficient documentation

## 2021-01-26 DIAGNOSIS — K219 Gastro-esophageal reflux disease without esophagitis: Secondary | ICD-10-CM

## 2021-01-26 DIAGNOSIS — Z8616 Personal history of COVID-19: Secondary | ICD-10-CM | POA: Diagnosis not present

## 2021-01-26 DIAGNOSIS — R739 Hyperglycemia, unspecified: Secondary | ICD-10-CM | POA: Diagnosis not present

## 2021-01-26 DIAGNOSIS — D72829 Elevated white blood cell count, unspecified: Secondary | ICD-10-CM | POA: Diagnosis not present

## 2021-01-26 DIAGNOSIS — Z79899 Other long term (current) drug therapy: Secondary | ICD-10-CM | POA: Insufficient documentation

## 2021-01-26 DIAGNOSIS — N401 Enlarged prostate with lower urinary tract symptoms: Secondary | ICD-10-CM | POA: Insufficient documentation

## 2021-01-26 DIAGNOSIS — R1310 Dysphagia, unspecified: Secondary | ICD-10-CM

## 2021-01-26 DIAGNOSIS — Z7982 Long term (current) use of aspirin: Secondary | ICD-10-CM | POA: Diagnosis not present

## 2021-01-26 DIAGNOSIS — Z87891 Personal history of nicotine dependence: Secondary | ICD-10-CM | POA: Insufficient documentation

## 2021-01-26 DIAGNOSIS — E1122 Type 2 diabetes mellitus with diabetic chronic kidney disease: Secondary | ICD-10-CM | POA: Diagnosis not present

## 2021-01-26 DIAGNOSIS — R000 Tachycardia, unspecified: Secondary | ICD-10-CM | POA: Diagnosis not present

## 2021-01-26 DIAGNOSIS — Z20822 Contact with and (suspected) exposure to covid-19: Secondary | ICD-10-CM | POA: Insufficient documentation

## 2021-01-26 DIAGNOSIS — N1832 Chronic kidney disease, stage 3b: Secondary | ICD-10-CM | POA: Insufficient documentation

## 2021-01-26 DIAGNOSIS — Z794 Long term (current) use of insulin: Secondary | ICD-10-CM | POA: Insufficient documentation

## 2021-01-26 DIAGNOSIS — I129 Hypertensive chronic kidney disease with stage 1 through stage 4 chronic kidney disease, or unspecified chronic kidney disease: Secondary | ICD-10-CM | POA: Insufficient documentation

## 2021-01-26 DIAGNOSIS — J9811 Atelectasis: Secondary | ICD-10-CM | POA: Diagnosis not present

## 2021-01-26 DIAGNOSIS — N400 Enlarged prostate without lower urinary tract symptoms: Secondary | ICD-10-CM | POA: Diagnosis present

## 2021-01-26 DIAGNOSIS — R0789 Other chest pain: Secondary | ICD-10-CM | POA: Diagnosis not present

## 2021-01-26 DIAGNOSIS — R079 Chest pain, unspecified: Principal | ICD-10-CM | POA: Diagnosis present

## 2021-01-26 DIAGNOSIS — Z8673 Personal history of transient ischemic attack (TIA), and cerebral infarction without residual deficits: Secondary | ICD-10-CM | POA: Diagnosis not present

## 2021-01-26 DIAGNOSIS — N183 Chronic kidney disease, stage 3 unspecified: Secondary | ICD-10-CM

## 2021-01-26 LAB — CBC
HCT: 42 % (ref 39.0–52.0)
Hemoglobin: 13.4 g/dL (ref 13.0–17.0)
MCH: 30.9 pg (ref 26.0–34.0)
MCHC: 31.9 g/dL (ref 30.0–36.0)
MCV: 96.8 fL (ref 80.0–100.0)
Platelets: 223 10*3/uL (ref 150–400)
RBC: 4.34 MIL/uL (ref 4.22–5.81)
RDW: 12.3 % (ref 11.5–15.5)
WBC: 12.4 10*3/uL — ABNORMAL HIGH (ref 4.0–10.5)
nRBC: 0 % (ref 0.0–0.2)

## 2021-01-26 LAB — TROPONIN I (HIGH SENSITIVITY)
Troponin I (High Sensitivity): 14 ng/L (ref <18)
Troponin I (High Sensitivity): 16 ng/L (ref <18)
Troponin I (High Sensitivity): 19 ng/L — ABNORMAL HIGH (ref <18)

## 2021-01-26 LAB — BASIC METABOLIC PANEL
Anion gap: 8 (ref 5–15)
BUN: 31 mg/dL — ABNORMAL HIGH (ref 8–23)
CO2: 23 mmol/L (ref 22–32)
Calcium: 8.6 mg/dL — ABNORMAL LOW (ref 8.9–10.3)
Chloride: 107 mmol/L (ref 98–111)
Creatinine, Ser: 1.46 mg/dL — ABNORMAL HIGH (ref 0.61–1.24)
GFR, Estimated: 45 mL/min — ABNORMAL LOW (ref >60)
Glucose, Bld: 283 mg/dL — ABNORMAL HIGH (ref 70–99)
Potassium: 4.5 mmol/L (ref 3.5–5.1)
Sodium: 138 mmol/L (ref 135–145)

## 2021-01-26 LAB — RESP PANEL BY RT-PCR (FLU A&B, COVID) ARPGX2
Influenza A by PCR: NEGATIVE
Influenza B by PCR: NEGATIVE
SARS Coronavirus 2 by RT PCR: NEGATIVE

## 2021-01-26 LAB — PROTIME-INR
INR: 1 (ref 0.8–1.2)
Prothrombin Time: 13 seconds (ref 11.4–15.2)

## 2021-01-26 LAB — GLUCOSE, CAPILLARY
Glucose-Capillary: 198 mg/dL — ABNORMAL HIGH (ref 70–99)
Glucose-Capillary: 173 mg/dL — ABNORMAL HIGH (ref 70–99)

## 2021-01-26 MED ORDER — LISINOPRIL 10 MG PO TABS
40.0000 mg | ORAL_TABLET | Freq: Every day | ORAL | Status: DC
  Administered 2021-01-27 – 2021-01-28 (×2): 40 mg via ORAL
  Filled 2021-01-26 (×2): qty 4

## 2021-01-26 MED ORDER — TAMSULOSIN HCL 0.4 MG PO CAPS
0.4000 mg | ORAL_CAPSULE | Freq: Every day | ORAL | Status: DC
  Administered 2021-01-27 – 2021-01-28 (×2): 0.4 mg via ORAL
  Filled 2021-01-26 (×2): qty 1

## 2021-01-26 MED ORDER — ACETAMINOPHEN 325 MG PO TABS: 650.0000 mg | ORAL_TABLET | Freq: Four times a day (QID) | ORAL | Status: DC | PRN

## 2021-01-26 MED ORDER — ATORVASTATIN CALCIUM 10 MG PO TABS
10.0000 mg | ORAL_TABLET | Freq: Every day | ORAL | Status: DC
  Administered 2021-01-27 – 2021-01-28 (×2): 10 mg via ORAL
  Filled 2021-01-26 (×2): qty 1

## 2021-01-26 MED ORDER — MEMANTINE HCL 10 MG PO TABS
10.0000 mg | ORAL_TABLET | Freq: Two times a day (BID) | ORAL | Status: DC
  Administered 2021-01-26 – 2021-01-28 (×4): 10 mg via ORAL
  Filled 2021-01-26 (×4): qty 1

## 2021-01-26 MED ORDER — ASPIRIN 81 MG PO CHEW
81.0000 mg | CHEWABLE_TABLET | Freq: Every day | ORAL | Status: DC
  Administered 2021-01-27 – 2021-01-28 (×2): 81 mg via ORAL
  Filled 2021-01-26 (×2): qty 1

## 2021-01-26 MED ORDER — ENOXAPARIN SODIUM 40 MG/0.4ML IJ SOSY
40.0000 mg | PREFILLED_SYRINGE | INTRAMUSCULAR | Status: DC
  Administered 2021-01-26 – 2021-01-27 (×2): 40 mg via SUBCUTANEOUS
  Filled 2021-01-26 (×2): qty 0.4

## 2021-01-26 MED ORDER — ACETAMINOPHEN 650 MG RE SUPP: 650.0000 mg | Freq: Four times a day (QID) | RECTAL | Status: DC | PRN

## 2021-01-26 MED ORDER — AMLODIPINE BESYLATE 5 MG PO TABS
10.0000 mg | ORAL_TABLET | Freq: Every day | ORAL | Status: DC
  Administered 2021-01-27 – 2021-01-28 (×2): 10 mg via ORAL
  Filled 2021-01-26 (×2): qty 2

## 2021-01-26 MED ORDER — ONDANSETRON HCL 4 MG/2ML IJ SOLN: 4.0000 mg | Freq: Four times a day (QID) | INTRAMUSCULAR | Status: DC | PRN

## 2021-01-26 MED ORDER — POLYETHYLENE GLYCOL 3350 17 G PO PACK: 17.0000 g | PACK | Freq: Every day | ORAL | Status: DC | PRN

## 2021-01-26 MED ORDER — ONDANSETRON HCL 4 MG PO TABS: 4.0000 mg | ORAL_TABLET | Freq: Four times a day (QID) | ORAL | Status: DC | PRN

## 2021-01-26 MED ORDER — ASPIRIN 81 MG PO CHEW
324.0000 mg | CHEWABLE_TABLET | Freq: Once | ORAL | Status: AC
  Administered 2021-01-26: 15:00:00 324 mg via ORAL
  Filled 2021-01-26: qty 4

## 2021-01-26 MED ORDER — GABAPENTIN 100 MG PO CAPS
100.0000 mg | ORAL_CAPSULE | Freq: Three times a day (TID) | ORAL | Status: DC
  Administered 2021-01-26 – 2021-01-28 (×5): 100 mg via ORAL
  Filled 2021-01-26 (×5): qty 1

## 2021-01-26 MED ORDER — INSULIN ASPART 100 UNIT/ML IJ SOLN
0.0000 [IU] | Freq: Three times a day (TID) | INTRAMUSCULAR | Status: DC
  Administered 2021-01-27: 13:00:00 3 [IU] via SUBCUTANEOUS
  Administered 2021-01-27: 09:00:00 2 [IU] via SUBCUTANEOUS
  Administered 2021-01-27: 18:00:00 3 [IU] via SUBCUTANEOUS
  Administered 2021-01-28 (×2): 2 [IU] via SUBCUTANEOUS

## 2021-01-26 MED ORDER — INSULIN GLARGINE-YFGN 100 UNIT/ML ~~LOC~~ SOLN
12.0000 [IU] | Freq: Every day | SUBCUTANEOUS | Status: DC
  Administered 2021-01-27 – 2021-01-28 (×2): 12 [IU] via SUBCUTANEOUS
  Filled 2021-01-26 (×4): qty 0.12

## 2021-01-26 MED ORDER — INSULIN ASPART 100 UNIT/ML IJ SOLN: 0.0000 [IU] | Freq: Every day | INTRAMUSCULAR | Status: DC

## 2021-01-26 NOTE — ED Notes (Signed)
Pt given water and repositioned in bed.

## 2021-01-26 NOTE — ED Notes (Signed)
Pt given urinal.

## 2021-01-26 NOTE — H&P (Addendum)
History and Physical    Mike Briggs PQD:[id number] DOB: 1928-02-27 DOA: 01/26/2021  PCP: [name], MD   Patient coming from: Home  I have personally briefly reviewed patient's old medical records in [place]  Chief Complaint: Chest pain  HPI: Mike Briggs is a 87 y.o. male with medical history significant for DM type II, CKD 3, BPH, dementia. Patient presented to the ED with complaints of chest pain started this morning at about 10:30 a.m. He is unable to describe the character of the pain to me.  Also associated difficulty breathing at that time. He had just eaten when chest pain started.  At this time is chest pain-free, feels he is back to his normal self and can go home.  He denies leg swelling or pain.  He has chronic right shoulder pain that is unchanged, he denies radiation of pain to his left shoulder  No associated nausea or vomiting. Patient lives alone.  ED Course: Temperature 98.4.  Heart rate recorded ranging from now 46 -103.  Blood pressure systolic 940H to 680S.  O2 sats greater than 96% on room air.  Troponin 14 > 16.  EKG without significant abnormalities. Aspirin 315m given.  As patient lives alone, and considering his advanced age, hospitalist was called to admit.  Review of Systems: As per HPI all other systems reviewed and negative.  Past Medical History:  Diagnosis Date   Acute cystitis 12/17/2018   Acute encephalopathy 11/14/2018   ANKLE, ARTHRITIS, DEGEN./OSTEO 12/16/2008   Qualifier: Diagnosis of  By: HAline BrochureMD, SDorothyann Peng    Arthritis    Cataract    Chronic kidney disease    COVID-19    COVID-19 virus detected 11/03/2018   COVID-19 virus infection 11/03/2018   Dehydration 11/02/2018   Depression    Diabetes mellitus    Gram-positive bacteremia 11/03/2018   Hyperlipidemia    Hypertension    Hypomagnesemia 03/24/2019   Klebsiella UTI  03/25/2019   Pain in finger of right hand 05/27/2019   Personal history of gout 05/22/2016    Rhabdomyolysis 11/02/2018   Stroke ([place]    Swelling of both hands 05/27/2019   Syncope 02/25/2018   TIA (transient ischemic attack) 11/14/2018   UTI (urinary tract infection) 02/26/2018   Vitamin D deficiency 05/31/2016    Past Surgical History:  Procedure Laterality Date   BACK SURGERY     cervical and lumbar   CATARACT EXTRACTION     CHOLECYSTECTOMY     FOOT SURGERY     5th toe    SPINE SURGERY     lumbar and cervical fusions     reports that he quit smoking about 53 years ago. His smoking use included cigarettes. He has never used smokeless tobacco. He reports that he does not drink alcohol and does not use drugs.  No Known Allergies  Family History  Family history unknown: Yes    Prior to Admission medications   Medication Sig Start Date End Date Taking? Authorizing Provider  amLODipine (NORVASC) 10 MG tablet TAKE ONE TABLET BY MOUTH ONCE DAILY 01/25/21  Yes PLindell Spar MD  aspirin 81 MG chewable tablet Chew 1 tablet (81 mg total) by mouth daily with breakfast. 12/04/19  Yes MPerlie Mayo NP  atorvastatin (LIPITOR) 10 MG tablet TAKE 1 TABLET BY MOUTH AFTER SUPPER. Patient taking differently: Take 10 mg by mouth daily. 01/19/21  Yes PLindell Spar MD  gabapentin (NEURONTIN) 100 MG capsule TAKE 1 CAPSULE BY  MOUTH THREE TIMES A DAY. Patient taking differently: Take 100 mg by mouth 3 (three) times daily. 07/25/20  Yes Noreene Larsson, NP  LANTUS SOLOSTAR 100 UNIT/ML Solostar Pen Inject 20 Units into the skin daily. 10/10/20  Yes [name], MD  lisinopril (ZESTRIL) 40 MG tablet TAKE (1) TABLET BY MOUTH ONCE DAILY. Patient taking differently: Take 40 mg by mouth daily. 01/19/21  Yes [name], MD  meloxicam (MOBIC) 7.5 MG tablet TAKE 1 TABLET BY MOUTH ONCE DAILY. 01/19/21  Yes [name], MD  memantine (NAMENDA) 10 MG tablet TAKE (1) TABLET BY MOUTH TWICE DAILY. Patient taking differently: Take 10 mg by mouth 2 (two) times daily. 01/19/21  Yes [name], MD  tamsulosin [facility]) 0.4 MG CAPS capsule Take 1 capsule (0.4 mg total) by mouth daily after breakfast. 01/15/20  Yes Irine Seal, MD  blood glucose meter kit and supplies Dispense based on patient and insurance preference. Use up to four times daily as directed. (FOR ICD-10 E10.9, E11.9). 04/05/20   [name], MD  blood glucose meter kit and supplies Dispense based on patient and insurance preference. Use up to four times daily as directed. (FOR ICD-10 E10.9, E11.9). 12/06/20   [name], MD  cephALEXin (KEFLEX) 250 MG capsule Take 1 capsule 3 x daily for a week and then take 1 po qhs with refills. Patient not taking: Reported on 01/26/2021 08/25/20   Irine Seal, MD  finasteride (PROSCAR) 5 MG tablet Take 1 tablet (5 mg total) by mouth daily. Patient not taking: Reported on 01/26/2021 12/04/19   Perlie Mayo, NP  mupirocin cream (BACTROBAN) 2 % Apply 1 application topically 2 (two) times daily. Patient not taking: Reported on 01/26/2021 04/05/20   [name], MD  SURE COMFORT PEN NEEDLES 31G X 5 MM MISC USE TO INJECT AS DIRECTED FOUR TIMES A DAY. WITH MEALS AND BEFORE BEDTIME. 08/17/20   [name], MD    Physical Exam: Vitals:   01/26/21 1330 01/26/21 1400 01/26/21 1430 01/26/21 1500  BP: (!) 133/56 (!) 145/85 (!) 151/75 128/75  Pulse: (!) 104 100 (!) 101 98  Resp: _0 Temp:      TempSrc:      SpO2: 99% 96% 100% 100%    Constitutional: NAD, calm, comfortable Vitals:   01/26/21 1330 01/26/21 1400 01/26/21 1430 01/26/21 1500  BP: (!) 133/56 (!) 145/85 (!) 151/75 128/75  Pulse: (!) 104 100 (!) 101 98  Resp: _1 Temp:      TempSrc:      SpO2: 99% 96% 100% 100%   Eyes: PERRL, lids and conjunctivae normal ENMT: Mucous membranes are moist.  Neck: normal, supple, no masses, no thyromegaly Respiratory: clear to auscultation bilaterally, no wheezing, no crackles. Normal respiratory effort. No accessory muscle use.  Cardiovascular: Regular rate  and rhythm, no murmurs / rubs / gallops. No extremity edema. 2+ pedal pulses.  Abdomen: no tenderness, no masses palpated. No hepatosplenomegaly. Bowel sounds positive.  Musculoskeletal: no clubbing / cyanosis. No joint deformity upper and lower extremities. Good ROM, no contractures. Normal muscle tone.  Skin: no rashes, lesions, ulcers. No induration Neurologic: No apparent cranial nerve abnormality, 4+5 strength in all extremities. Psychiatric: Normal judgment and insight. Alert and oriented x 3. Normal mood.   Labs on Admission: I have personally reviewed following labs and imaging studies  CBC: Recent Labs  Lab 01/26/21 1150  WBC 12.4*  HGB  13.4  HCT 42.0  MCV 96.8  PLT 943   Basic Metabolic Panel: Recent Labs  Lab 01/26/21 1150  NA 138  K 4.5  CL 107  CO2 23  GLUCOSE 283*  BUN 31*  CREATININE 1.46*  CALCIUM 8.6*   Coagulation Profile: Recent Labs  Lab 01/26/21 1150  INR 1.0   Radiological Exams on Admission: DG Chest Port 1 View  Result Date: 01/26/2021 CLINICAL DATA:  Chest pain. EXAM: PORTABLE CHEST 1 VIEW COMPARISON:  02/04/2020 FINDINGS: The cardiomediastinal silhouette is unchanged with normal heart size. Aortic atherosclerosis is noted. The lungs are hypoinflated with minimal atelectasis in the lung bases. No edema, sizable pleural effusion, or pneumothorax is identified. No acute osseous abnormality is seen. IMPRESSION: No active disease. Electronically Signed   By: Logan Bores M.D.   On: 01/26/2021 12:26    EKG: Independently reviewed.  Sinus tachycardia rate 102, QTc 442.  No significant ST or T wave changes compared to prior.  Second-degree Mobitz type I AV block-more evident in repeat EKG.  Similar to EKG from 10/2020.  Assessment/Plan Principal Problem:   Chest pain Active Problems:   Type 2 diabetes mellitus with diabetic chronic kidney disease (HCC)   Chronic kidney disease, stage III (moderate) (HCC)   Vascular dementia (HCC)   Essential  hypertension   BPH (benign prostatic hyperplasia)   Chest pain-with associated dyspnea, both have since resolved.  Transient initial tachycardia.  EKG without significant ST or T wave changes, but shows old second-degree Mobitz type I AV block and troponins so far unremarkable. HEART score- 3.  -Repeat EKG (called that tele is showing irregular rhythym with PVCs), no significant change from prior. -Obtain echo -Trend troponin -Continue aspirin, statins - NPO midinght   Mobitz type I second-degree AV block-chronic.  Controlled diabetes mellitus-random glucose 283.  A1c 6.8. -Resume Lantus at reduced dose 12 units daily - SSI- S  CKD 3 A-creatinine 1.46 at baseline.  BPH -Resume tamsulosin  Hypertension-stable. -Resume lisinopril, Norvasc, tamsulosin  Dementia appears mild. - Resume memantine   DVT prophylaxis: Lovenox Code Status: Full code, confirmed with patient at bedside Family Communication: Patient's grand niece Mike Briggs, was at the bedside at some points, as her mother was also in the ED. Disposition Plan: ~ 1 day Consults called: None Admission status: Obs tele   Bethena Roys MD [facility]  01/26/2021, 6:52 PM

## 2021-01-26 NOTE — ED Notes (Signed)
Pt given 2 cups water

## 2021-01-26 NOTE — Plan of Care (Signed)

## 2021-01-26 NOTE — ED Triage Notes (Signed)
Patient via EMS for chest pain that started around 1030 this morning.

## 2021-01-26 NOTE — ED Provider Notes (Signed)
[facility] Provider Note  CSN: [id number] Arrival date & time: 01/26/21 1142    History Chief Complaint  Patient presents with   Chest Pain    Mike Briggs is a 87 y.o. male presents for evaluation of L sided chest pain onset this morning, associated with SOB although he states he is SOB all the time to some extent. No fever or cough. Has had pain in R shoulder for several weeks but no chest pain radiating into his L arm today.    Past Medical History:  Diagnosis Date   Acute cystitis 12/17/2018   Acute encephalopathy 11/14/2018   ANKLE, ARTHRITIS, DEGEN./OSTEO 12/16/2008   Qualifier: Diagnosis of  By: Aline Brochure MD, Dorothyann Peng     Arthritis    Cataract    Chronic kidney disease    COVID-19    COVID-19 virus detected 11/03/2018   COVID-19 virus infection 11/03/2018   Dehydration 11/02/2018   Depression    Diabetes mellitus    Gram-positive bacteremia 11/03/2018   Hyperlipidemia    Hypertension    Hypomagnesemia 03/24/2019   Klebsiella UTI  03/25/2019   Pain in finger of right hand 05/27/2019   Personal history of gout 05/22/2016   Rhabdomyolysis 11/02/2018   Stroke ([place])    Swelling of both hands 05/27/2019   Syncope 02/25/2018   TIA (transient ischemic attack) 11/14/2018   UTI (urinary tract infection) 02/26/2018   Vitamin D deficiency 05/31/2016    Past Surgical History:  Procedure Laterality Date   BACK SURGERY     cervical and lumbar   CATARACT EXTRACTION     CHOLECYSTECTOMY     FOOT SURGERY     5th toe    SPINE SURGERY     lumbar and cervical fusions    Family History  Family history unknown: Yes    Social History   Tobacco Use   Smoking status: Former    Types: Cigarettes    Quit date: 08/07/1967    Years since quitting: 53.5   Smokeless tobacco: Never  Vaping Use   Vaping Use: Never used  Substance Use Topics   Alcohol use: No   Drug use: No     Home Medications Prior to Admission medications   Medication Sig Start Date  End Date Taking? Authorizing Provider  amLODipine (NORVASC) 10 MG tablet TAKE ONE TABLET BY MOUTH ONCE DAILY 01/25/21   [name], MD  aspirin 81 MG chewable tablet Chew 1 tablet (81 mg total) by mouth daily with breakfast. 12/04/19   Perlie Mayo, NP  atorvastatin (LIPITOR) 10 MG tablet TAKE 1 TABLET BY MOUTH AFTER SUPPER. 01/19/21   [name], MD  blood glucose meter kit and supplies Dispense based on patient and insurance preference. Use up to four times daily as directed. (FOR ICD-10 E10.9, E11.9). 04/05/20   [name], MD  blood glucose meter kit and supplies Dispense based on patient and insurance preference. Use up to four times daily as directed. (FOR ICD-10 E10.9, E11.9). 12/06/20   [name], MD  cephALEXin (KEFLEX) 250 MG capsule Take 1 capsule 3 x daily for a week and then take 1 po qhs with refills. 08/25/20   Irine Seal, MD  finasteride (PROSCAR) 5 MG tablet Take 1 tablet (5 mg total) by mouth daily. 12/04/19   Perlie Mayo, NP  gabapentin (NEURONTIN) 100 MG capsule TAKE 1 CAPSULE BY MOUTH THREE TIMES A DAY. 07/25/20   Noreene Larsson, NP  LANTUS SOLOSTAR 100 UNIT/ML Solostar Pen Inject 20 Units into the skin daily. 10/10/20   [name], MD  lisinopril (ZESTRIL) 40 MG tablet TAKE (1) TABLET BY MOUTH ONCE DAILY. 01/19/21   [name], MD  meloxicam (MOBIC) 7.5 MG tablet TAKE 1 TABLET BY MOUTH ONCE DAILY. 01/19/21   [name], MD  memantine (NAMENDA) 10 MG tablet TAKE (1) TABLET BY MOUTH TWICE DAILY. 01/19/21   [name], MD  mupirocin cream (BACTROBAN) 2 % Apply 1 application topically 2 (two) times daily. 04/05/20   [name], MD  SURE COMFORT PEN NEEDLES 31G X 5 MM MISC USE TO INJECT AS DIRECTED FOUR TIMES A DAY. WITH MEALS AND BEFORE BEDTIME. 08/17/20   [name], MD  tamsulosin [facility]) 0.4 MG CAPS capsule Take 1 capsule (0.4 mg total) by mouth daily after breakfast. 01/15/20   Irine Seal, MD     Allergies    Patient has no  known allergies.   Review of Systems   Review of Systems A comprehensive review of systems was completed and negative except as noted in HPI.    Physical Exam BP 128/75    Pulse 98    Temp 98.4 F (36.9 C) (Oral)    Resp 18    SpO2 100%   Physical Exam Vitals and nursing note reviewed.  Constitutional:      Appearance: Normal appearance.  HENT:     Head: Normocephalic and atraumatic.     Nose: Nose normal.     Mouth/Throat:     Mouth: Mucous membranes are moist.  Eyes:     Extraocular Movements: Extraocular movements intact.     Conjunctiva/sclera: Conjunctivae normal.  Cardiovascular:     Rate and Rhythm: Normal rate.  Pulmonary:     Effort: Pulmonary effort is normal.     Breath sounds: Normal breath sounds.  Abdominal:     General: Abdomen is flat.     Palpations: Abdomen is soft.     Tenderness: There is no abdominal tenderness.  Musculoskeletal:        General: No swelling. Normal range of motion.     Cervical back: Neck supple.     Right lower leg: Edema (trace) present.     Left lower leg: Edema (trace) present.  Skin:    General: Skin is warm and dry.  Neurological:     General: No focal deficit present.     Mental Status: He is alert.  Psychiatric:        Mood and Affect: Mood normal.     ED Results / Procedures / Treatments   Labs (all labs ordered are listed, but only abnormal results are displayed) Labs Reviewed  BASIC METABOLIC PANEL - Abnormal; Notable for the following components:      Result Value   Glucose, Bld 283 (*)    BUN 31 (*)    Creatinine, Ser 1.46 (*)    Calcium 8.6 (*)    GFR, Estimated 45 (*)    All other components within normal limits  CBC - Abnormal; Notable for the following components:   WBC 12.4 (*)    All other components within normal limits  RESP PANEL BY RT-PCR (FLU A&B, COVID) [id number]  PROTIME-INR  TROPONIN I (HIGH SENSITIVITY)  TROPONIN I (HIGH SENSITIVITY)    EKG EKG Interpretation  Date/Time:  Thursday  January 26 2021 11:44:43 EST Ventricular Rate:  102 PR Interval:  222 QRS Duration: 86 QT Interval:  339 QTC Calculation: 442 R Axis:   -11 Text Interpretation: Sinus tachycardia Sinus pause Prolonged PR interval Low voltage, precordial leads Probable LVH with secondary repol abnrm Since last tracing Rate faster Confirmed by Calvert Cantor [phone]) on 01/26/2021 12:43:09 PM  Radiology DG Chest Port 1 View  Result Date: 01/26/2021 CLINICAL DATA:  Chest pain. EXAM: PORTABLE CHEST 1 VIEW COMPARISON:  02/04/2020 FINDINGS: The cardiomediastinal silhouette is unchanged with normal heart size. Aortic atherosclerosis is noted. The lungs are hypoinflated with minimal atelectasis in the lung bases. No edema, sizable pleural effusion, or pneumothorax is identified. No acute osseous abnormality is seen. IMPRESSION: No active disease. Electronically Signed   By: Logan Bores M.D.   On: 01/26/2021 12:26    Procedures Procedures  Medications Ordered in the ED Medications  aspirin chewable tablet 324 mg (has no administration in time range)     MDM Rules/Calculators/A&P MDM Patient here with chest pain, not currently hurting. Will check labs, CXR and reassess. Sinus rhythm on the monitor.   ED Course  I have reviewed the triage vital signs and the nursing notes.  Pertinent labs & imaging results that were available during my care of the patient were reviewed by me and considered in my medical decision making (see chart for details).  Clinical Course as of 01/26/21 1509  Thu Jan 26, 2021  1320 CXR is clear. CBC with mild leukocytosis. First Trop is neg. INR ordered by triage protocol is neg.  [CS]  0932 CMP shows CKD at baseline.  [CS]  1448 Trop remains flat. Given his age and comorbidities will discuss admission with hospitalist.  [CS]  [phone] Spoke with Dr. Arlyce Dice, Hospitalist, who will evaluate the patient for admission.  [CS]    Clinical Course User Index [CS] Truddie Hidden, MD     Final Clinical Impression(s) / ED Diagnoses Final diagnoses:  Chest pain, unspecified type    Rx / DC Orders ED Discharge Orders     None        Truddie Hidden, MD 01/26/21 [phone]

## 2021-01-27 ENCOUNTER — Observation Stay (HOSPITAL_BASED_OUTPATIENT_CLINIC_OR_DEPARTMENT_OTHER): Payer: Medicare HMO

## 2021-01-27 DIAGNOSIS — R079 Chest pain, unspecified: Secondary | ICD-10-CM

## 2021-01-27 DIAGNOSIS — I129 Hypertensive chronic kidney disease with stage 1 through stage 4 chronic kidney disease, or unspecified chronic kidney disease: Secondary | ICD-10-CM | POA: Diagnosis not present

## 2021-01-27 DIAGNOSIS — N401 Enlarged prostate with lower urinary tract symptoms: Secondary | ICD-10-CM | POA: Diagnosis not present

## 2021-01-27 DIAGNOSIS — I441 Atrioventricular block, second degree: Secondary | ICD-10-CM | POA: Diagnosis not present

## 2021-01-27 DIAGNOSIS — I10 Essential (primary) hypertension: Secondary | ICD-10-CM | POA: Diagnosis not present

## 2021-01-27 DIAGNOSIS — N1832 Chronic kidney disease, stage 3b: Secondary | ICD-10-CM

## 2021-01-27 DIAGNOSIS — Z794 Long term (current) use of insulin: Secondary | ICD-10-CM | POA: Diagnosis not present

## 2021-01-27 DIAGNOSIS — D72829 Elevated white blood cell count, unspecified: Secondary | ICD-10-CM | POA: Diagnosis not present

## 2021-01-27 DIAGNOSIS — R1319 Other dysphagia: Secondary | ICD-10-CM | POA: Diagnosis not present

## 2021-01-27 DIAGNOSIS — E1122 Type 2 diabetes mellitus with diabetic chronic kidney disease: Secondary | ICD-10-CM | POA: Diagnosis not present

## 2021-01-27 DIAGNOSIS — R072 Precordial pain: Secondary | ICD-10-CM | POA: Diagnosis not present

## 2021-01-27 DIAGNOSIS — K219 Gastro-esophageal reflux disease without esophagitis: Secondary | ICD-10-CM

## 2021-01-27 DIAGNOSIS — R1310 Dysphagia, unspecified: Secondary | ICD-10-CM

## 2021-01-27 DIAGNOSIS — Z20822 Contact with and (suspected) exposure to covid-19: Secondary | ICD-10-CM | POA: Diagnosis not present

## 2021-01-27 DIAGNOSIS — Z8616 Personal history of COVID-19: Secondary | ICD-10-CM | POA: Diagnosis not present

## 2021-01-27 LAB — ECHOCARDIOGRAM COMPLETE
AR max vel: 1.6 cm2
AV Area VTI: 1.83 cm2
AV Area mean vel: 1.72 cm2
AV Mean grad: 7.5 mmHg
AV Peak grad: 14.7 mmHg
Ao pk vel: 1.92 m/s
Area-P 1/2: 3.68 cm2
Height: 71 in
MV VTI: 2.07 cm2
S' Lateral: 3.2 cm
Weight: 3647.29 oz

## 2021-01-27 LAB — GLUCOSE, CAPILLARY
Glucose-Capillary: 126 mg/dL — ABNORMAL HIGH (ref 70–99)
Glucose-Capillary: 179 mg/dL — ABNORMAL HIGH (ref 70–99)
Glucose-Capillary: 161 mg/dL — ABNORMAL HIGH (ref 70–99)
Glucose-Capillary: 171 mg/dL — ABNORMAL HIGH (ref 70–99)

## 2021-01-27 LAB — URINALYSIS, COMPLETE (UACMP) WITH MICROSCOPIC
Bilirubin Urine: NEGATIVE
Glucose, UA: NEGATIVE mg/dL
Ketones, ur: NEGATIVE mg/dL
Nitrite: NEGATIVE
Protein, ur: 100 mg/dL — AB
RBC / HPF: >50 RBC/hpf — ABNORMAL HIGH (ref 0–5)
Specific Gravity, Urine: 1.012 (ref 1.005–1.030)
WBC, UA: >50 WBC/hpf — ABNORMAL HIGH (ref 0–5)
pH: 7 (ref 5.0–8.0)

## 2021-01-27 MED ORDER — PANTOPRAZOLE SODIUM 40 MG IV SOLR
40.0000 mg | Freq: Once | INTRAVENOUS | Status: AC
  Administered 2021-01-27: 11:00:00 40 mg via INTRAVENOUS
  Filled 2021-01-27: qty 40

## 2021-01-27 MED ORDER — POLYETHYLENE GLYCOL 3350 17 G PO PACK
17.0000 g | PACK | Freq: Every day | ORAL | Status: DC
  Administered 2021-01-27 – 2021-01-28 (×2): 17 g via ORAL
  Filled 2021-01-27 (×2): qty 1

## 2021-01-27 MED ORDER — PANTOPRAZOLE SODIUM 40 MG PO TBEC
40.0000 mg | DELAYED_RELEASE_TABLET | Freq: Every day | ORAL | Status: DC
  Administered 2021-01-28: 40 mg via ORAL
  Filled 2021-01-27: qty 1

## 2021-01-27 MED ORDER — PANTOPRAZOLE SODIUM 40 MG IV SOLR: 40.0000 mg | INTRAVENOUS | Status: DC

## 2021-01-27 NOTE — Progress Notes (Signed)
*  PRELIMINARY RESULTS* Echocardiogram 2D Echocardiogram has been performed.  Mike Briggs 01/27/2021, 2:28 PM

## 2021-01-27 NOTE — Consult Note (Signed)
CARDIOLOGY CONSULT NOTE       Patient ID: Mike Briggs MRN: [id number] DOB/AGE: [birth year] 87 y.o.  Admit date: 01/26/2021 Referring Physician: Tat Primary Physician: [name], MD Primary Cardiologist: McDowell/Ross Reason for Consultation: Heart Block/Chest pain  Principal Problem:   Chest pain Active Problems:   Type 2 diabetes mellitus with diabetic chronic kidney disease ([place])   Chronic kidney disease, stage III (moderate) ([place])   Vascular dementia ([place])   Essential hypertension   BPH (benign prostatic hyperplasia)   HPI:  87 y.o. with demential who is wheel chair bound and not ambulatory. Admitted with chest pain R/O CXR NAD. ECG showes SR with PAC, PVC, intermittent 2nd degree heart block with variable PR and short span 2;1 block with narrow QRS. No PAF. He is not on beta blocker and K/Cr ok. He is not having chest pain this am. He was seen by Dr Domenic Polite for same 10/2018 and Toprol d/c In room he had periodic arrhythmia with no symptoms and stable BP Telemetry also showed short run of NSVT   ROS All other systems reviewed and negative except as noted above  Past Medical History:  Diagnosis Date   Acute cystitis 12/17/2018   Acute encephalopathy 11/14/2018   ANKLE, ARTHRITIS, DEGEN./OSTEO 12/16/2008   Qualifier: Diagnosis of  By: Aline Brochure MD, Stanley     Arthritis    Cataract    Chronic kidney disease    COVID-19    COVID-19 virus detected 11/03/2018   COVID-19 virus infection 11/03/2018   Dehydration 11/02/2018   Depression    Diabetes mellitus    Gram-positive bacteremia 11/03/2018   Hyperlipidemia    Hypertension    Hypomagnesemia 03/24/2019   Klebsiella UTI  03/25/2019   Pain in finger of right hand 05/27/2019   Personal history of gout 05/22/2016   Rhabdomyolysis 11/02/2018   Stroke ([place])    Swelling of both hands 05/27/2019   Syncope 02/25/2018   TIA (transient ischemic attack) 11/14/2018   UTI (urinary tract infection) 02/26/2018   Vitamin D  deficiency 05/31/2016    Family History  Family history unknown: Yes    Social History   Socioeconomic History   Marital status: Divorced    Spouse name: Not on file   Number of children: 3   Years of education: 10   Highest education level: Not on file  Occupational History   Occupation: Retired.  Tobacco Use   Smoking status: Former    Types: Cigarettes    Quit date: 08/07/1967    Years since quitting: 53.5   Smokeless tobacco: Never  Vaping Use   Vaping Use: Never used  Substance and Sexual Activity   Alcohol use: No   Drug use: No   Sexual activity: Not Currently  Other Topics Concern   Not on file  Social History Narrative   Wheelchair bound.Has not walked in many years, at least five years.  Lives alone. Has 2 aides that come in to help him.   Lives alone.    Retired.   Eats all food groups.    Has 3 children but does not have contact with them.   Divorced.   Social Determinants of Health   Financial Resource Strain: Not on file  Food Insecurity: Not on file  Transportation Needs: Not on file  Physical Activity: Not on file  Stress: Not on file  Social Connections: Not on file  Intimate Partner Violence: Not on file    Past Surgical History:  Procedure Laterality  Date   BACK SURGERY     cervical and lumbar   CATARACT EXTRACTION     CHOLECYSTECTOMY     FOOT SURGERY     5th toe    SPINE SURGERY     lumbar and cervical fusions      Current Facility-Administered Medications:    acetaminophen (TYLENOL) tablet 650 mg, 650 mg, Oral, Q6H PRN **OR** acetaminophen (TYLENOL) suppository 650 mg, 650 mg, Rectal, Q6H PRN, Emokpae, Ejiroghene E, MD   amLODipine (NORVASC) tablet 10 mg, 10 mg, Oral, Daily, Emokpae, Ejiroghene E, MD   aspirin chewable tablet 81 mg, 81 mg, Oral, Q breakfast, Emokpae, Ejiroghene E, MD   atorvastatin (LIPITOR) tablet 10 mg, 10 mg, Oral, Daily, Emokpae, Ejiroghene E, MD   enoxaparin (LOVENOX) injection 40 mg, 40 mg, Subcutaneous, Q24H,  Emokpae, Ejiroghene E, MD, 40 mg at 01/26/21 1828   gabapentin (NEURONTIN) capsule 100 mg, 100 mg, Oral, TID, Emokpae, Ejiroghene E, MD, 100 mg at 01/26/21 2230   insulin aspart (novoLOG) injection 0-15 Units, 0-15 Units, Subcutaneous, TID WC, Emokpae, Ejiroghene E, MD   insulin aspart (novoLOG) injection 0-5 Units, 0-5 Units, Subcutaneous, QHS, Emokpae, Ejiroghene E, MD   insulin glargine-yfgn (SEMGLEE) injection 12 Units, 12 Units, Subcutaneous, Daily, Emokpae, Ejiroghene E, MD   lisinopril (ZESTRIL) tablet 40 mg, 40 mg, Oral, Daily, Emokpae, Ejiroghene E, MD   memantine (NAMENDA) tablet 10 mg, 10 mg, Oral, BID, Emokpae, Ejiroghene E, MD, 10 mg at 01/26/21 2230   ondansetron (ZOFRAN) tablet 4 mg, 4 mg, Oral, Q6H PRN **OR** ondansetron (ZOFRAN) injection 4 mg, 4 mg, Intravenous, Q6H PRN, Emokpae, Ejiroghene E, MD   polyethylene glycol (MIRALAX / GLYCOLAX) packet 17 g, 17 g, Oral, Daily PRN, Emokpae, Ejiroghene E, MD   tamsulosin (FLOMAX) capsule 0.4 mg, 0.4 mg, Oral, QPC breakfast, Emokpae, Ejiroghene E, MD  amLODipine  10 mg Oral Daily   aspirin  81 mg Oral Q breakfast   atorvastatin  10 mg Oral Daily   enoxaparin (LOVENOX) injection  40 mg Subcutaneous Q24H   gabapentin  100 mg Oral TID   insulin aspart  0-15 Units Subcutaneous TID WC   insulin aspart  0-5 Units Subcutaneous QHS   insulin glargine-yfgn  12 Units Subcutaneous Daily   lisinopril  40 mg Oral Daily   memantine  10 mg Oral BID   tamsulosin  0.4 mg Oral QPC breakfast     Physical Exam: Blood pressure (!) 162/64, pulse (!) 45, temperature 97.7 F (36.5 C), temperature source Oral, resp. rate 17, height 5\' 11"  (1.803 m), weight 103.4 kg, SpO2 100 %.   Obese black male No murmur  Clear lungs Abdomen soft Palpable DP bilaterally Trace edema   Labs:   Lab Results  Component Value Date   WBC 12.4 (H) 01/26/2021   HGB 13.4 01/26/2021   HCT 42.0 01/26/2021   MCV 96.8 01/26/2021   PLT 223 01/26/2021    Recent Labs   Lab 01/26/21 1150  NA 138  K 4.5  CL 107  CO2 23  BUN 31*  CREATININE 1.46*  CALCIUM 8.6*  GLUCOSE 283*   Lab Results  Component Value Date   CKTOTAL 536 (H) 11/03/2018   CKMB 1.7 08/07/2011   TROPONINI 0.03 (HH) 02/26/2018    Lab Results  Component Value Date   CHOL 138 12/06/2020   CHOL 146 11/14/2018   CHOL 143 09/10/2017   Lab Results  Component Value Date   HDL 37 (L) 12/06/2020   HDL 28 (  L) 11/14/2018   HDL 30 (L) 09/10/2017   Lab Results  Component Value Date   LDLCALC 83 12/06/2020   LDLCALC 94 11/14/2018   LDLCALC 92 09/10/2017   Lab Results  Component Value Date   TRIG 93 12/06/2020   TRIG 120 11/14/2018   TRIG 115 09/10/2017   Lab Results  Component Value Date   CHOLHDL 3.7 12/06/2020   CHOLHDL 5.2 11/14/2018   CHOLHDL 4.8 09/10/2017   No results found for: LDLDIRECT    Radiology: [facility] Chest Port 1 View  Result Date: 01/26/2021 CLINICAL DATA:  Chest pain. EXAM: PORTABLE CHEST 1 VIEW COMPARISON:  02/04/2020 FINDINGS: The cardiomediastinal silhouette is unchanged with normal heart size. Aortic atherosclerosis is noted. The lungs are hypoinflated with minimal atelectasis in the lung bases. No edema, sizable pleural effusion, or pneumothorax is identified. No acute osseous abnormality is seen. IMPRESSION: No active disease. Electronically Signed   By: Logan Bores M.D.   On: 01/26/2021 12:26   DG Foot Complete Left  Result Date: 12/28/2020 Please see detailed radiograph report in office note.  DG Foot Complete Right  Result Date: 12/28/2020 Please see detailed radiograph report in office note.   EKG: 2:1 HB with narrow escape rate 60    ASSESSMENT AND PLAN:   Heart Block:  has had same issue 2 years ago. 2:1 block PAC/PVC NSVT. Asymptomatic. Given his age , dementia an non ambulatory status he is not a candidate for PPM. Not on AV nodal drug. K/Cr ok. Check TSH. No further Rx needed will arrange outpatient f/u with Dr Domenic Polite Chest Pain:   resolved r/o no acute ST changes echo pending Had normal EF on TTE 60-65% in 2020   Signed: Jenkins Rouge 01/27/2021, 8:39 AM

## 2021-01-27 NOTE — Progress Notes (Addendum)
PROGRESS NOTE  Mike Briggs TOI:[id number] DOB: 05-21-1928 DOA: 01/26/2021 PCP: [name], MD  Brief History:  87 year old male with a history of stroke, hypertension, hyperlipidemia, diabetes mellitus type 2, CKD stage III, cognitive impairment, BPH presenting with chest pain.  The patient is a difficult historian.  He states that he has been developing chest discomfort which he describes as "something stuck in my throat" after he eats some food.  He is not able to elaborate how long it lasts.  He states that he has some shortness of breath associated with it.  He denies any nausea, vomiting, coughing, hemoptysis, fevers, chills.  He denies any exertional chest discomfort or worsening shortness of breath.  He denies any radiation of his pain to his neck or arm.  There is no dizziness or syncope. Troponins have been unremarkable, 14>> 16>> 19.  EKG showed sinus rhythm with Mobitz type I AV block.  Assessment/Plan: Chest pain -Appears mostly atypical by clinical history -Echocardiogram -Troponins unremarkable -EKG without concerning ischemic changes -continue ASA  Dysphagia -Patient has been having solid food dysphagia -GI consult  Mobitz I AV block -avoiding AV blocking nodal agents  CKD stage IIIb -Baseline creatinine 1.4-1.6  Diabetes mellitus type 2, controlled -12/06/2020 hemoglobin A1c 6.8 -Continue reduced dose of Semglee -NovoLog sliding scale  Hyperlipidemia -Continue statin  Essential hypertension -Continue amlodipine, lisinopril  Cognitive Impairment -continue memantine  BPH -Continue tamsulosin      Status is: Observation  The patient remains OBS appropriate and will d/c before 2 midnights.       Family Communication:  no Family at bedside  Consultants:  GI, cardiology  Code Status:  FULL   DVT Prophylaxis: [place] Lovenox   Procedures: As Listed in Progress Note Above  Antibiotics: None      Subjective: Patient  denies fevers, chills, headache, chest pain, dyspnea, nausea, vomiting, diarrhea, abdominal pain, dysuria, hematuria, hematochezia, and melena.   Objective: Vitals:   01/26/21 1707 01/26/21 1715 01/26/21 2006 01/27/21 0514  BP: 129/76 129/76 (!) 150/58 (!) 162/64  Pulse: (!) 46 (!) 46 80 (!) 45  Resp:  18 17 17   Temp: 97.8 F (36.6 C) 97.8 F (36.6 C) 97.8 F (36.6 C) 97.7 F (36.5 C)  TempSrc: Oral Oral Oral Oral  SpO2: 100%  99% 100%  Weight:  103.4 kg    Height:  5\' 11"  (1.803 m)      Intake/Output Summary (Last 24 hours) at 01/27/2021 0726 Last data filed at 01/27/2021 8338 Gross per 24 hour  Intake 240 ml  Output 1200 ml  Net -960 ml   Weight change:  Exam:  General:  Pt is alert, follows commands appropriately, not in acute distress HEENT: No icterus, No thrush, No neck mass, [place]/AT Cardiovascular: RRR, S1/S2, no rubs, no gallops Respiratory: CTA bilaterally, no wheezing, no crackles, no rhonchi Abdomen: Soft/+BS, non tender, non distended, no guarding Extremities: No edema, No lymphangitis, No petechiae, No rashes, no synovitis   Data Reviewed: I have personally reviewed following labs and imaging studies Basic Metabolic Panel: Recent Labs  Lab 01/26/21 1150  NA 138  K 4.5  CL 107  CO2 23  GLUCOSE 283*  BUN 31*  CREATININE 1.46*  CALCIUM 8.6*   Liver Function Tests: No results for input(s): AST, ALT, ALKPHOS, BILITOT, PROT, ALBUMIN in the last 168 hours. No results for input(s): LIPASE, AMYLASE in the last 168 hours. No results for input(s): AMMONIA in  the last 168 hours. Coagulation Profile: Recent Labs  Lab 01/26/21 1150  INR 1.0   CBC: Recent Labs  Lab 01/26/21 1150  WBC 12.4*  HGB 13.4  HCT 42.0  MCV 96.8  PLT 223   Cardiac Enzymes: No results for input(s): CKTOTAL, CKMB, CKMBINDEX, TROPONINI in the last 168 hours. BNP: Invalid input(s): POCBNP CBG: Recent Labs  Lab 01/26/21 1831 01/26/21 2003  GLUCAP 198* 173*   HbA1C: No  results for input(s): HGBA1C in the last 72 hours. Urine analysis:    Component Value Date/Time   COLORURINE YELLOW 02/04/2020 1653   APPEARANCEUR Cloudy (A) 08/25/2020 1024   LABSPEC 1.013 02/04/2020 1653   PHURINE 7.0 02/04/2020 1653   GLUCOSEU Negative 08/25/2020 1024   HGBUR MODERATE (A) 02/04/2020 1653   BILIRUBINUR Negative 08/25/2020 1024   KETONESUR negative 03/09/2020 0958   KETONESUR NEGATIVE 02/04/2020 1653   PROTEINUR 4+ 08/25/2020 1024   PROTEINUR 100 (A) 02/04/2020 1653   UROBILINOGEN 0.2 03/09/2020 0958   UROBILINOGEN 0.2 03/21/2014 1840   NITRITE Negative 08/25/2020 1024   NITRITE NEGATIVE 02/04/2020 1653   LEUKOCYTESUR 3+ 08/25/2020 1024   LEUKOCYTESUR LARGE (A) 02/04/2020 1653   Sepsis Labs: @LABRCNTIP (procalcitonin:4,lacticidven:4) ) Recent Results (from the past 240 hour(s))  Resp Panel by RT-PCR (Flu A&B, Covid) Nasopharyngeal Swab     Status: None   Collection Time: 01/26/21 12:18 PM   Specimen: Nasopharyngeal Swab; Nasopharyngeal(NP) swabs in vial transport medium  Result Value Ref Range Status   SARS Coronavirus 2 by RT PCR NEGATIVE NEGATIVE Final    Comment: (NOTE) SARS-CoV-2 target nucleic acids are NOT DETECTED.  The SARS-CoV-2 RNA is generally detectable in upper respiratory specimens during the acute phase of infection. The lowest concentration of SARS-CoV-2 viral copies this assay can detect is 138 copies/mL. A negative result does not preclude SARS-Cov-2 infection and should not be used as the sole basis for treatment or other patient management decisions. A negative result may occur with  improper specimen collection/handling, submission of specimen other than nasopharyngeal swab, presence of viral mutation(s) within the areas targeted by this assay, and inadequate number of viral copies(<138 copies/mL). A negative result must be combined with clinical observations, patient history, and epidemiological information. The expected result is  Negative.  Fact Sheet for Patients:  EntrepreneurPulse.com.au  Fact Sheet for Healthcare Providers:  IncredibleEmployment.be  This test is no t yet approved or cleared by the [place] FDA and  has been authorized for detection and/or diagnosis of SARS-CoV-2 by FDA under an Emergency Use Authorization (EUA). This EUA will remain  in effect (meaning this test can be used) for the duration of the COVID-19 declaration under Section 564(b)(1) of the Act, 21 U.S.C.section 360bbb-3(b)(1), unless the authorization is terminated  or revoked sooner.       Influenza A by PCR NEGATIVE NEGATIVE Final   Influenza B by PCR NEGATIVE NEGATIVE Final    Comment: (NOTE) The Xpert Xpress SARS-CoV-2/FLU/RSV plus assay is intended as an aid in the diagnosis of influenza from Nasopharyngeal swab specimens and should not be used as a sole basis for treatment. Nasal washings and aspirates are unacceptable for Xpert Xpress SARS-CoV-2/FLU/RSV testing.  Fact Sheet for Patients: EntrepreneurPulse.com.au  Fact Sheet for Healthcare Providers: IncredibleEmployment.be  This test is not yet approved or cleared by the [place] FDA and has been authorized for detection and/or diagnosis of SARS-CoV-2 by FDA under an Emergency Use Authorization (EUA). This EUA will remain in effect (meaning this test can be used)  for the duration of the COVID-19 declaration under Section 564(b)(1) of the Act, 21 U.S.C. section 360bbb-3(b)(1), unless the authorization is terminated or revoked.  Performed at [facility], [address], [place], [place] [address]      Scheduled Meds:  amLODipine  10 mg Oral Daily   aspirin  81 mg Oral Q breakfast   atorvastatin  10 mg Oral Daily   enoxaparin (LOVENOX) injection  40 mg Subcutaneous Q24H   gabapentin  100 mg Oral TID   insulin aspart  0-15 Units Subcutaneous TID WC   insulin aspart  0-5 Units  Subcutaneous QHS   insulin glargine-yfgn  12 Units Subcutaneous Daily   lisinopril  40 mg Oral Daily   memantine  10 mg Oral BID   tamsulosin  0.4 mg Oral QPC breakfast   Continuous Infusions:  Procedures/Studies: DG Chest Port 1 View  Result Date: 01/26/2021 CLINICAL DATA:  Chest pain. EXAM: PORTABLE CHEST 1 VIEW COMPARISON:  02/04/2020 FINDINGS: The cardiomediastinal silhouette is unchanged with normal heart size. Aortic atherosclerosis is noted. The lungs are hypoinflated with minimal atelectasis in the lung bases. No edema, sizable pleural effusion, or pneumothorax is identified. No acute osseous abnormality is seen. IMPRESSION: No active disease. Electronically Signed   By: Logan Bores M.D.   On: 01/26/2021 12:26   DG Foot Complete Left  Result Date: 12/28/2020 Please see detailed radiograph report in office note.  DG Foot Complete Right  Result Date: 12/28/2020 Please see detailed radiograph report in office note.   Orson Eva, DO  [facility]  If 7PM-7AM, please contact night-coverage [email or web address] Password [email or web address] 01/27/2021, 7:26 AM   LOS: 0 days

## 2021-01-27 NOTE — Progress Notes (Addendum)
error 

## 2021-01-27 NOTE — Consult Note (Signed)
Referring Provider: No ref. provider found Primary Care Physician:  [name], MD Primary Gastroenterologist:  not established  Date of Admission: 01/26/21 Date of Consultation: 01/27/21  Reason for Consultation:  dysphagia  HPI:  Mike Briggs is a 87 y.o. year old male with history of stroke, HTN, HLD, DM type 2, CKD stage III, cognitive impairment, BPH who presented to the ED yesterday with Chest pain. Patient described his chest pain as feeling as though something was stuck in his throat after he eats, though he could not elaborate much on the details of what he was experiencing. Also has some SOB associated with this feeling. Denies nausea, vomiting, coughing, fevers, chills. Denied any radiation of pain. Troponins have been unremarkable, EKG without acute ST changes, hx of 2:1 heart block PAC/PVC NSVT. GI consulted for further evaluation of dysphagia.  Consult: Patient somewhat of a poor historian. states he has been having issues with certain foods feeling stuck when he eats, occurring only over the past few days, he is unable to pinpoint exactly which foods cause issues but denies any trouble with liquid or pills. He denies any issues with dysphagia in the past. He reports that he has some issues with heart burn, unable to pinpoint how often, but reports only sometimes when he eats, vs everytime he eats. Sometimes he has to cough the food back up, but other times it will pass down with drinking liquids.  He denies nausea, vomiting, abdominal pain, or diarrhea. He does endorse constipation, having 1 BM per week, with a lot of straining required to defecate, he is unsure of timing of last BM. He denies any blood in stools or melena. He was able to eat breakfast consisting of eggs this morning, that he tolerated well without any dysphagia. He denies any chest pain at this time.  Last CBU:LAGTX Last Colonoscopy:unsure of date of last TCS  Past Medical History:  Diagnosis Date    Acute cystitis 12/17/2018   Acute encephalopathy 11/14/2018   ANKLE, ARTHRITIS, DEGEN./OSTEO 12/16/2008   Qualifier: Diagnosis of  By: Aline Brochure MD, [place]     Arthritis    Cataract    Chronic kidney disease    COVID-19    COVID-19 virus detected 11/03/2018   COVID-19 virus infection 11/03/2018   Dehydration 11/02/2018   Depression    Diabetes mellitus    Gram-positive bacteremia 11/03/2018   Hyperlipidemia    Hypertension    Hypomagnesemia 03/24/2019   Klebsiella UTI  03/25/2019   Pain in finger of right hand 05/27/2019   Personal history of gout 05/22/2016   Rhabdomyolysis 11/02/2018   Stroke ([place])    Swelling of both hands 05/27/2019   Syncope 02/25/2018   TIA (transient ischemic attack) 11/14/2018   UTI (urinary tract infection) 02/26/2018   Vitamin D deficiency 05/31/2016    Past Surgical History:  Procedure Laterality Date   BACK SURGERY     cervical and lumbar   CATARACT EXTRACTION     CHOLECYSTECTOMY     FOOT SURGERY     5th toe    SPINE SURGERY     lumbar and cervical fusions    Prior to Admission medications   Medication Sig Start Date End Date Taking? Authorizing Provider  amLODipine (NORVASC) 10 MG tablet TAKE ONE TABLET BY MOUTH ONCE DAILY 01/25/21  Yes [name], MD  aspirin 81 MG chewable tablet Chew 1 tablet (81 mg total) by mouth daily with breakfast. 12/04/19  Yes Perlie Mayo, NP  atorvastatin (LIPITOR) 10 MG tablet TAKE 1 TABLET BY MOUTH AFTER SUPPER. Patient taking differently: Take 10 mg by mouth daily. 01/19/21  Yes [name], MD  gabapentin (NEURONTIN) 100 MG capsule TAKE 1 CAPSULE BY MOUTH THREE TIMES A DAY. Patient taking differently: Take 100 mg by mouth 3 (three) times daily. 07/25/20  Yes Noreene Larsson, NP  LANTUS SOLOSTAR 100 UNIT/ML Solostar Pen Inject 20 Units into the skin daily. 10/10/20  Yes [name], MD  lisinopril (ZESTRIL) 40 MG tablet TAKE (1) TABLET BY MOUTH ONCE DAILY. Patient taking differently: Take 40 mg by mouth  daily. 01/19/21  Yes [name], MD  meloxicam (MOBIC) 7.5 MG tablet TAKE 1 TABLET BY MOUTH ONCE DAILY. 01/19/21  Yes [name], MD  memantine (NAMENDA) 10 MG tablet TAKE (1) TABLET BY MOUTH TWICE DAILY. Patient taking differently: Take 10 mg by mouth 2 (two) times daily. 01/19/21  Yes [name], MD  tamsulosin [facility]) 0.4 MG CAPS capsule Take 1 capsule (0.4 mg total) by mouth daily after breakfast. 01/15/20  Yes Irine Seal, MD  blood glucose meter kit and supplies Dispense based on patient and insurance preference. Use up to four times daily as directed. (FOR ICD-10 E10.9, E11.9). 04/05/20   [name], MD  blood glucose meter kit and supplies Dispense based on patient and insurance preference. Use up to four times daily as directed. (FOR ICD-10 E10.9, E11.9). 12/06/20   [name], MD  cephALEXin (KEFLEX) 250 MG capsule Take 1 capsule 3 x daily for a week and then take 1 po qhs with refills. Patient not taking: Reported on 01/26/2021 08/25/20   Irine Seal, MD  finasteride (PROSCAR) 5 MG tablet Take 1 tablet (5 mg total) by mouth daily. Patient not taking: Reported on 01/26/2021 12/04/19   Perlie Mayo, NP  mupirocin cream (BACTROBAN) 2 % Apply 1 application topically 2 (two) times daily. Patient not taking: Reported on 01/26/2021 04/05/20   [name], MD  SURE COMFORT PEN NEEDLES 31G X 5 MM MISC USE TO INJECT AS DIRECTED FOUR TIMES A DAY. WITH MEALS AND BEFORE BEDTIME. 08/17/20   [name], MD    Current Facility-Administered Medications  Medication Dose Route Frequency Provider Last Rate Last Admin   acetaminophen (TYLENOL) tablet 650 mg  650 mg Oral Q6H PRN Emokpae, Ejiroghene E, MD       Or   acetaminophen (TYLENOL) suppository 650 mg  650 mg Rectal Q6H PRN Emokpae, Ejiroghene E, MD       amLODipine (NORVASC) tablet 10 mg  10 mg Oral Daily Emokpae, Ejiroghene E, MD   10 mg at 01/27/21 0859   aspirin chewable tablet 81 mg  81 mg Oral Q breakfast Emokpae,  Ejiroghene E, MD   81 mg at 01/27/21 0900   atorvastatin (LIPITOR) tablet 10 mg  10 mg Oral Daily Emokpae, Ejiroghene E, MD   10 mg at 01/27/21 0859   enoxaparin (LOVENOX) injection 40 mg  40 mg Subcutaneous Q24H Emokpae, Ejiroghene E, MD   40 mg at 01/26/21 1828   gabapentin (NEURONTIN) capsule 100 mg  100 mg Oral TID Emokpae, Ejiroghene E, MD   100 mg at 01/27/21 0859   insulin aspart (novoLOG) injection 0-15 Units  0-15 Units Subcutaneous TID WC Emokpae, Ejiroghene E, MD   2 Units at 01/27/21 0900   insulin aspart (novoLOG) injection 0-5 Units  0-5 Units Subcutaneous QHS Emokpae, Ejiroghene E, MD  insulin glargine-yfgn (SEMGLEE) injection 12 Units  12 Units Subcutaneous Daily Emokpae, Ejiroghene E, MD       lisinopril (ZESTRIL) tablet 40 mg  40 mg Oral Daily Emokpae, Ejiroghene E, MD   40 mg at 01/27/21 0858   memantine (NAMENDA) tablet 10 mg  10 mg Oral BID Emokpae, Ejiroghene E, MD   10 mg at 01/27/21 0859   ondansetron (ZOFRAN) tablet 4 mg  4 mg Oral Q6H PRN Emokpae, Ejiroghene E, MD       Or   ondansetron (ZOFRAN) injection 4 mg  4 mg Intravenous Q6H PRN Emokpae, Ejiroghene E, MD       polyethylene glycol (MIRALAX / GLYCOLAX) packet 17 g  17 g Oral Daily PRN Emokpae, Ejiroghene E, MD       tamsulosin (FLOMAX) capsule 0.4 mg  0.4 mg Oral QPC breakfast Emokpae, Ejiroghene E, MD   0.4 mg at 01/27/21 0859    Allergies as of 01/26/2021   (No Known Allergies)    Family History  Family history unknown: Yes    Social History   Socioeconomic History   Marital status: Divorced    Spouse name: Not on file   Number of children: 3   Years of education: 10   Highest education level: Not on file  Occupational History   Occupation: Retired.  Tobacco Use   Smoking status: Former    Types: Cigarettes    Quit date: 08/07/1967    Years since quitting: 53.5   Smokeless tobacco: Never  Vaping Use   Vaping Use: Never used  Substance and Sexual Activity   Alcohol use: No   Drug use: No    Sexual activity: Not Currently  Other Topics Concern   Not on file  Social History Narrative   Wheelchair bound.Has not walked in many years, at least five years.  Lives alone. Has 2 aides that come in to help him.   Lives alone.    Retired.   Eats all food groups.    Has 3 children but does not have contact with them.   Divorced.   Social Determinants of Health   Financial Resource Strain: Not on file  Food Insecurity: Not on file  Transportation Needs: Not on file  Physical Activity: Not on file  Stress: Not on file  Social Connections: Not on file  Intimate Partner Violence: Not on file   Review of Systems: Gen: Denies fever, chills, loss of appetite, change in weight or weight loss CV: Denies chest pain, heart palpitations, syncope, edema  Resp: Denies shortness of breath with rest, cough, wheezing GI: Denies melena, hematochezia, nausea, vomiting, diarrhea, odyonophagia, early satiety or weight loss. +dysphagia +constipation GU : Denies urinary burning, urinary frequency, urinary incontinence.  MS: Denies joint pain,swelling, cramping Derm: Denies rash, itching, dry skin Psych: Denies depression, anxiety,confusion, or memory loss Heme: Denies bruising, bleeding, and enlarged lymph nodes.  Physical Exam: Vital signs in last 24 hours: Temp:  [97.7 F (36.5 C)-98.4 F (36.9 C)] 97.7 F (36.5 C) (12/30 0514) Pulse Rate:  [45-104] 45 (12/30 0514) Resp:  [12-18] 17 (12/30 0514) BP: (128-162)/(56-85) 162/64 (12/30 0514) SpO2:  [96 %-100 %] 100 % (12/30 0514) Weight:  [103.4 kg] 103.4 kg (12/29 1715) Last BM Date:  (PTA per pt report) General:   Alert,  Well-developed, well-nourished, pleasant and cooperative in NAD Head:  Normocephalic and atraumatic. Eyes:  Sclera clear, no icterus.   Conjunctiva pink. Nose:  No deformity, discharge,  or lesions. Mouth:  No deformity or lesions, dentition normal. Lungs:  Clear throughout to auscultation.   No wheezes, crackles, or  rhonchi. No acute distress. Heart:  Regular rate and rhythm; no murmurs, clicks, rubs,  or gallops. Abdomen:  Soft, nontender and nondistended. No masses, hepatosplenomegaly or hernias noted. Normal bowel sounds, without guarding, and without rebound.   Rectal:  Deferred until time of colonoscopy.   Msk:  Symmetrical without gross deformities. Normal posture. Pulses:  Normal pulses noted. Extremities:  Without clubbing or edema. Neurologic:  Alert and  oriented x4;  grossly normal neurologically. Skin:  Intact without significant lesions or rashes. Cervical Nodes:  No significant cervical adenopathy. Psych:  Alert and cooperative. Normal mood and affect.  Intake/Output from previous day: 12/29 0701 - 12/30 0700 In: 240 [P.O.:240] Out: 1200 [Urine:1200] Intake/Output this shift: Total I/O In: 240 [P.O.:240] Out: -   Lab Results: Recent Labs    01/26/21 1150  WBC 12.4*  HGB 13.4  HCT 42.0  PLT 223   BMET Recent Labs    01/26/21 1150  NA 138  K 4.5  CL 107  CO2 23  GLUCOSE 283*  BUN 31*  CREATININE 1.46*  CALCIUM 8.6*   LFT No results for input(s): PROT, ALBUMIN, AST, ALT, ALKPHOS, BILITOT, BILIDIR, IBILI in the last 72 hours. PT/INR Recent Labs    01/26/21 1150  LABPROT 13.0  INR 1.0   Studies/Results: DG Chest Port 1 View  Result Date: 01/26/2021 CLINICAL DATA:  Chest pain. EXAM: PORTABLE CHEST 1 VIEW COMPARISON:  02/04/2020 FINDINGS: The cardiomediastinal silhouette is unchanged with normal heart size. Aortic atherosclerosis is noted. The lungs are hypoinflated with minimal atelectasis in the lung bases. No edema, sizable pleural effusion, or pneumothorax is identified. No acute osseous abnormality is seen. IMPRESSION: No active disease. Electronically Signed   By: Logan Bores M.D.   On: 01/26/2021 12:26    Impression: Mike Briggs is a 87 y.o. year old male with history of stroke, HTN, HLD, DM type 2, CKD stage III, cognitive impairment, BPH who  presented to the ED yesterday with Chest pain and feeling as though something was stuck in his throat after he eats, Denies nausea, vomiting, coughing, fevers, chills. Denied any radiation of pain. Troponins have been unremarkable, EKG without acute ST changes, hx of 2:1 heart block PAC/PVC NSVT.  GI consulted for further evaluation of dysphagia.  Dysphagia: occurring only occasionally, patient unable to pinpoint how often, states it started a few days ago, he thinks maybe it is worse with certain foods, though denies issues with pills or liquids. He denies any other GI symptoms besides occasional acid reflux. He ate breakfast this morning without issue. He denies pain at this time. I discussed watchful waiting for now with initiation of PPI, if symptoms become more frequent we can reconsider EGD for further evaluation, but given his age, lack of alarm symptoms infrequency of occurrence, I think holding off on EGD at this time is reasonable unless dysphagia worsens or new GI symptoms develop. patient is amenable to this plan.  Constipation: patient reports infrequent BMs with required straining for defecation, usually about once per week. Likely related to diet/polypharmacy and age related changes given lack of alarm symptoms. Will do daily miralax 17g, can increase to BID if no results from once daily dosing. He denies melena, rectal bleeding, abdominal pain, weight loss or appetite changes  Plan: Pantoprazole 65m once daily 30-45 minutes prior to breakfast Consider EGD if dysphagia becomes more persistent Miralax 17g  daily, increase to BID if no improvement with once daily dosing Take small bites Chew thoroughly Sips between bites Stay upright 2-3 hours after eating   LOS: 0 days    01/27/2021, 10:02 AM   Refugia Laneve L. Alver Sorrow, MSN, APRN, AGNP-C Adult-Gerontology Nurse Practitioner [facility]

## 2021-01-28 DIAGNOSIS — E1122 Type 2 diabetes mellitus with diabetic chronic kidney disease: Secondary | ICD-10-CM | POA: Diagnosis not present

## 2021-01-28 DIAGNOSIS — R1319 Other dysphagia: Secondary | ICD-10-CM | POA: Diagnosis not present

## 2021-01-28 DIAGNOSIS — R072 Precordial pain: Secondary | ICD-10-CM | POA: Diagnosis not present

## 2021-01-28 DIAGNOSIS — I10 Essential (primary) hypertension: Secondary | ICD-10-CM | POA: Diagnosis not present

## 2021-01-28 DIAGNOSIS — R079 Chest pain, unspecified: Secondary | ICD-10-CM | POA: Diagnosis not present

## 2021-01-28 DIAGNOSIS — N1832 Chronic kidney disease, stage 3b: Secondary | ICD-10-CM | POA: Diagnosis not present

## 2021-01-28 LAB — BASIC METABOLIC PANEL
Anion gap: 8 (ref 5–15)
BUN: 33 mg/dL — ABNORMAL HIGH (ref 8–23)
CO2: 25 mmol/L (ref 22–32)
Calcium: 8.3 mg/dL — ABNORMAL LOW (ref 8.9–10.3)
Chloride: 106 mmol/L (ref 98–111)
Creatinine, Ser: 1.49 mg/dL — ABNORMAL HIGH (ref 0.61–1.24)
GFR, Estimated: 44 mL/min — ABNORMAL LOW (ref >60)
Glucose, Bld: 123 mg/dL — ABNORMAL HIGH (ref 70–99)
Potassium: 5.1 mmol/L (ref 3.5–5.1)
Sodium: 139 mmol/L (ref 135–145)

## 2021-01-28 LAB — GLUCOSE, CAPILLARY
Glucose-Capillary: 123 mg/dL — ABNORMAL HIGH (ref 70–99)
Glucose-Capillary: 135 mg/dL — ABNORMAL HIGH (ref 70–99)

## 2021-01-28 LAB — CBC
HCT: 34.9 % — ABNORMAL LOW (ref 39.0–52.0)
Hemoglobin: 10.8 g/dL — ABNORMAL LOW (ref 13.0–17.0)
MCH: 29.6 pg (ref 26.0–34.0)
MCHC: 30.9 g/dL (ref 30.0–36.0)
MCV: 95.6 fL (ref 80.0–100.0)
Platelets: 208 10*3/uL (ref 150–400)
RBC: 3.65 MIL/uL — ABNORMAL LOW (ref 4.22–5.81)
RDW: 12.2 % (ref 11.5–15.5)
WBC: 10.6 10*3/uL — ABNORMAL HIGH (ref 4.0–10.5)
nRBC: 0 % (ref 0.0–0.2)

## 2021-01-28 MED ORDER — HYDRALAZINE HCL 10 MG PO TABS: 10.0000 mg | ORAL_TABLET | Freq: Two times a day (BID) | ORAL | 2 refills | Status: DC

## 2021-01-28 MED ORDER — PANTOPRAZOLE SODIUM 40 MG PO TBEC: 40.0000 mg | DELAYED_RELEASE_TABLET | Freq: Every day | ORAL | 2 refills | Status: DC

## 2021-01-28 NOTE — Discharge Summary (Signed)
Physician Discharge Summary  Mike Briggs:[id number] DOB: 01/10/1929 DOA: 01/26/2021  PCP: [name], MD  Admit date: 01/26/2021 Discharge date: 01/28/2021  Admitted From: Home Disposition:  Home   Recommendations for Outpatient Follow-up:  Follow up with PCP in 1-2 weeks Please obtain BMP/CBC in one week   Discharge Condition: Stable CODE STATUS:FULL Diet recommendation: Heart Healthy / Carb Modified    Brief/Interim Summary: 87 year old male with a history of stroke, hypertension, hyperlipidemia, diabetes mellitus type 2, CKD stage III, cognitive impairment, BPH presenting with chest pain.  The patient is a difficult historian.  He states that he has been developing chest discomfort which he describes as "something stuck in my throat" after he eats some food.  He is not able to elaborate how long it lasts.  He states that he has some shortness of breath associated with it.  He denies any nausea, vomiting, coughing, hemoptysis, fevers, chills.  He denies any exertional chest discomfort or worsening shortness of breath.  He denies any radiation of his pain to his neck or arm.  There is no dizziness or syncope. Troponins have been unremarkable, 14>> 16>> 19.  EKG showed sinus rhythm with Mobitz type I AV block.  GI and cardiology were consulted to assist.  Discharge Diagnoses:  Chest pain -Appears mostly atypical by clinical history -Echo EF 60-65%, no WMA, trivial MR -Troponins unremarkable -EKG without concerning ischemic changes -continue ASA -cardiology consulted>>no further workup inpatient   Dysphagia -Patient has been having solid food dysphagia -GI consult appreciated--suspect he has esophageal motility disorder; since it is intermittent, not progressive, would just observe for now   Mobitz I AV block -avoiding AV blocking nodal agents -appreciate cardiology consult>>not a candidate for PPM, no further work up needed   CKD stage IIIb -Baseline  creatinine 1.4-1.6   Diabetes mellitus type 2, controlled -12/06/2020 hemoglobin A1c 6.8 -Continue reduced dose of Semglee -NovoLog sliding scale   Hyperlipidemia -Continue statin   Essential hypertension -Continue amlodipine -discontinue lisinopril as his potassium is climbing -use hydralazine in lieu of lisinopril   Cognitive Impairment -continue memantine   BPH -Continue tamsulosin     Discharge Instructions   Allergies as of 01/28/2021   No Known Allergies      Medication List     STOP taking these medications    cephALEXin 250 MG capsule Commonly known as: KEFLEX   finasteride 5 MG tablet Commonly known as: PROSCAR   lisinopril 40 MG tablet Commonly known as: ZESTRIL   meloxicam 7.5 MG tablet Commonly known as: MOBIC       TAKE these medications    amLODipine 10 MG tablet Commonly known as: NORVASC TAKE ONE TABLET BY MOUTH ONCE DAILY   aspirin 81 MG chewable tablet Chew 1 tablet (81 mg total) by mouth daily with breakfast.   atorvastatin 10 MG tablet Commonly known as: LIPITOR TAKE 1 TABLET BY MOUTH AFTER SUPPER. What changed: See the new instructions.   blood glucose meter kit and supplies Dispense based on patient and insurance preference. Use up to four times daily as directed. (FOR ICD-10 E10.9, E11.9).   blood glucose meter kit and supplies Dispense based on patient and insurance preference. Use up to four times daily as directed. (FOR ICD-10 E10.9, E11.9).   gabapentin 100 MG capsule Commonly known as: NEURONTIN TAKE 1 CAPSULE BY MOUTH THREE TIMES A DAY. What changed: See the new instructions.   hydrALAZINE 10 MG tablet Commonly known as: APRESOLINE Take 1 tablet (10 mg  total) by mouth 2 (two) times daily.   Lantus SoloStar 100 UNIT/ML Solostar Pen Generic drug: insulin glargine Inject 20 Units into the skin daily.   memantine 10 MG tablet Commonly known as: NAMENDA TAKE (1) TABLET BY MOUTH TWICE DAILY. What changed: See  the new instructions.   mupirocin cream 2 % Commonly known as: BACTROBAN Apply 1 application topically 2 (two) times daily.   pantoprazole 40 MG tablet Commonly known as: PROTONIX Take 1 tablet (40 mg total) by mouth daily.   Sure Comfort Pen Needles 31G X 5 MM Misc Generic drug: Insulin Pen Needle USE TO INJECT AS DIRECTED FOUR TIMES A DAY. WITH MEALS AND BEFORE BEDTIME.   tamsulosin 0.4 MG Caps capsule Commonly known as: FLOMAX TAKE 1 CAPSULE BY MOUTH DAILY AFTER BREAKFAST. What changed: See the new instructions.        No Known Allergies  Consultations: GI cardiology   Procedures/Studies: DG Chest Port 1 View  Result Date: 01/26/2021 CLINICAL DATA:  Chest pain. EXAM: PORTABLE CHEST 1 VIEW COMPARISON:  02/04/2020 FINDINGS: The cardiomediastinal silhouette is unchanged with normal heart size. Aortic atherosclerosis is noted. The lungs are hypoinflated with minimal atelectasis in the lung bases. No edema, sizable pleural effusion, or pneumothorax is identified. No acute osseous abnormality is seen. IMPRESSION: No active disease. Electronically Signed   By: Logan Bores M.D.   On: 01/26/2021 12:26   ECHOCARDIOGRAM COMPLETE  Result Date: 01/27/2021    ECHOCARDIOGRAM REPORT   Patient Name:   Mike Briggs Date of Exam: 01/27/2021 Medical Rec #:  [id number]        Height:       71.0 in Accession #:    [id number]       Weight:       228.0 lb Date of Birth:  [birth year]        BSA:          2.229 m Patient Age:    87 years         BP:           162/64 mmHg Patient Gender: M                HR:           45 bpm. Exam Location:  [facility] Procedure: 2D Echo, Cardiac Doppler and Color Doppler Indications:    Chest Pain  History:        Patient has prior history of Echocardiogram examinations, most                 recent 11/14/2018. Previous Myocardial Infarction, Stroke,                 Signs/Symptoms:Chest Pain and Syncope; Risk                 Factors:Hypertension, Diabetes,  Dyslipidemia and Former Smoker.  Sonographer:    Wenda Low Referring Phys: [place]  1. Left ventricular ejection fraction, by estimation, is 60 to 65%. The left ventricle has normal function. The left ventricle has no regional wall motion abnormalities. There is mild left ventricular hypertrophy. Left ventricular diastolic parameters were normal.  2. Right ventricular systolic function is normal. The right ventricular size is normal. There is normal pulmonary artery systolic pressure.  3. The mitral valve is abnormal. Trivial mitral valve regurgitation. No evidence of mitral stenosis.  4. The aortic valve is tricuspid. There is mild calcification of the aortic valve. Aortic valve regurgitation  is not visualized. Aortic valve sclerosis is present, with no evidence of aortic valve stenosis.  5. The inferior vena cava is normal in size with greater than 50% respiratory variability, suggesting right atrial pressure of 3 mmHg. FINDINGS  Left Ventricle: Left ventricular ejection fraction, by estimation, is 60 to 65%. The left ventricle has normal function. The left ventricle has no regional wall motion abnormalities. The left ventricular internal cavity size was normal in size. There is  mild left ventricular hypertrophy. Left ventricular diastolic parameters were normal. Right Ventricle: The right ventricular size is normal. No increase in right ventricular wall thickness. Right ventricular systolic function is normal. There is normal pulmonary artery systolic pressure. The tricuspid regurgitant velocity is 2.28 m/s, and  with an assumed right atrial pressure of 3 mmHg, the estimated right ventricular systolic pressure is 97.6 mmHg. Left Atrium: Left atrial size was normal in size. Right Atrium: Right atrial size was normal in size. Pericardium: There is no evidence of pericardial effusion. Mitral Valve: The mitral valve is abnormal. There is mild thickening of the mitral valve  leaflet(s). There is mild calcification of the mitral valve leaflet(s). Trivial mitral valve regurgitation. No evidence of mitral valve stenosis. MV peak gradient, 7.1 mmHg. The mean mitral valve gradient is 1.0 mmHg. Tricuspid Valve: The tricuspid valve is normal in structure. Tricuspid valve regurgitation is trivial. No evidence of tricuspid stenosis. Aortic Valve: The aortic valve is tricuspid. There is mild calcification of the aortic valve. Aortic valve regurgitation is not visualized. Aortic valve sclerosis is present, with no evidence of aortic valve stenosis. Aortic valve mean gradient measures 7.5 mmHg. Aortic valve peak gradient measures 14.7 mmHg. Aortic valve area, by VTI measures 1.83 cm. Pulmonic Valve: The pulmonic valve was normal in structure. Pulmonic valve regurgitation is not visualized. No evidence of pulmonic stenosis. Aorta: The aortic root is normal in size and structure. Venous: The inferior vena cava is normal in size with greater than 50% respiratory variability, suggesting right atrial pressure of 3 mmHg. IAS/Shunts: No atrial level shunt detected by color flow Doppler.  LEFT VENTRICLE PLAX 2D LVIDd:         5.00 cm   Diastology LVIDs:         3.20 cm   LV e' medial:    10.00 cm/s LV PW:         1.20 cm   LV E/e' medial:  11.7 LV IVS:        1.20 cm   LV e' lateral:   16.50 cm/s LVOT diam:     2.00 cm   LV E/e' lateral: 7.1 LV SV:         90 LV SV Index:   41 LVOT Area:     3.14 cm  RIGHT VENTRICLE RV Basal diam:  3.60 cm RV Mid diam:    2.70 cm RV S prime:     14.30 cm/s TAPSE (M-mode): 2.3 cm LEFT ATRIUM             Index        RIGHT ATRIUM           Index LA diam:        3.60 cm 1.61 cm/m   RA Area:     18.30 cm LA Vol (A2C):   55.0 ml 24.67 ml/m  RA Volume:   51.30 ml  23.01 ml/m LA Vol (A4C):   54.2 ml 24.31 ml/m LA Biplane Vol: 57.9 ml 25.97 ml/m  AORTIC VALVE  PULMONIC VALVE AV Area (Vmax):    1.60 cm      PV Vmax:       0.72 m/s AV Area (Vmean):   1.72  cm      PV Peak grad:  2.1 mmHg AV Area (VTI):     1.83 cm AV Vmax:           191.50 cm/s AV Vmean:          119.500 cm/s AV VTI:            0.496 m AV Peak Grad:      14.7 mmHg AV Mean Grad:      7.5 mmHg LVOT Vmax:         97.30 cm/s LVOT Vmean:        65.500 cm/s LVOT VTI:          0.288 m LVOT/AV VTI ratio: 0.58  AORTA Ao Root diam: 2.80 cm Ao Asc diam:  3.00 cm MITRAL VALVE                TRICUSPID VALVE MV Area (PHT): 3.68 cm     TR Peak grad:   20.8 mmHg MV Area VTI:   2.07 cm     TR Vmax:        228.00 cm/s MV Peak grad:  7.1 mmHg MV Mean grad:  1.0 mmHg     SHUNTS MV Vmax:       1.33 m/s     Systemic VTI:  0.29 m MV Vmean:      49.5 cm/s    Systemic Diam: 2.00 cm MV Decel Time: 206 msec MV E velocity: 117.00 cm/s MV A velocity: 80.10 cm/s MV E/A ratio:  1.46 Jenkins Rouge MD Electronically signed by Jenkins Rouge MD Signature Date/Time: 01/27/2021/2:35:12 PM    Final         Discharge Exam: Vitals:   01/28/21 0534 01/28/21 0819  BP: (!) 153/97 (!) 142/78  Pulse: 96   Resp: 18   Temp: 98.1 F (36.7 C)   SpO2: 97%    Vitals:   01/27/21 0514 01/27/21 2143 01/28/21 0534 01/28/21 0819  BP: (!) 162/64 (!) 156/51 (!) 153/97 (!) 142/78  Pulse: (!) 45 (!) 44 96   Resp: $Remo'17 20 18   'QgGAv$ Temp: 97.7 F (36.5 C) 97.7 F (36.5 C) 98.1 F (36.7 C)   TempSrc: Oral Oral Oral   SpO2: 100% 98% 97%   Weight:      Height:        General: Pt is alert, awake, not in acute distress Cardiovascular: RRR, S1/S2 +, no rubs, no gallops Respiratory: CTA bilaterally, no wheezing, no rhonchi Abdominal: Soft, NT, ND, bowel sounds + Extremities: no edema, no cyanosis   The results of significant diagnostics from this hospitalization (including imaging, microbiology, ancillary and laboratory) are listed below for reference.    Significant Diagnostic Studies: DG Chest Port 1 View  Result Date: 01/26/2021 CLINICAL DATA:  Chest pain. EXAM: PORTABLE CHEST 1 VIEW COMPARISON:  02/04/2020 FINDINGS: The  cardiomediastinal silhouette is unchanged with normal heart size. Aortic atherosclerosis is noted. The lungs are hypoinflated with minimal atelectasis in the lung bases. No edema, sizable pleural effusion, or pneumothorax is identified. No acute osseous abnormality is seen. IMPRESSION: No active disease. Electronically Signed   By: Logan Bores M.D.   On: 01/26/2021 12:26   ECHOCARDIOGRAM COMPLETE  Result Date: 01/27/2021    ECHOCARDIOGRAM REPORT   Patient Name:   Mike Briggs  Brymer Date of Exam: 01/27/2021 Medical Rec #:  [id number]        Height:       71.0 in Accession #:    [id number]       Weight:       228.0 lb Date of Birth:  09/26/1928        BSA:          2.229 m Patient Age:    40 years         BP:           162/64 mmHg Patient Gender: M                HR:           45 bpm. Exam Location:  [facility] Procedure: 2D Echo, Cardiac Doppler and Color Doppler Indications:    Chest Pain  History:        Patient has prior history of Echocardiogram examinations, most                 recent 11/14/2018. Previous Myocardial Infarction, Stroke,                 Signs/Symptoms:Chest Pain and Syncope; Risk                 Factors:Hypertension, Diabetes, Dyslipidemia and Former Smoker.  Sonographer:    Wenda Low Referring Phys: [place]  1. Left ventricular ejection fraction, by estimation, is 60 to 65%. The left ventricle has normal function. The left ventricle has no regional wall motion abnormalities. There is mild left ventricular hypertrophy. Left ventricular diastolic parameters were normal.  2. Right ventricular systolic function is normal. The right ventricular size is normal. There is normal pulmonary artery systolic pressure.  3. The mitral valve is abnormal. Trivial mitral valve regurgitation. No evidence of mitral stenosis.  4. The aortic valve is tricuspid. There is mild calcification of the aortic valve. Aortic valve regurgitation is not visualized. Aortic valve sclerosis  is present, with no evidence of aortic valve stenosis.  5. The inferior vena cava is normal in size with greater than 50% respiratory variability, suggesting right atrial pressure of 3 mmHg. FINDINGS  Left Ventricle: Left ventricular ejection fraction, by estimation, is 60 to 65%. The left ventricle has normal function. The left ventricle has no regional wall motion abnormalities. The left ventricular internal cavity size was normal in size. There is  mild left ventricular hypertrophy. Left ventricular diastolic parameters were normal. Right Ventricle: The right ventricular size is normal. No increase in right ventricular wall thickness. Right ventricular systolic function is normal. There is normal pulmonary artery systolic pressure. The tricuspid regurgitant velocity is 2.28 m/s, and  with an assumed right atrial pressure of 3 mmHg, the estimated right ventricular systolic pressure is 76.1 mmHg. Left Atrium: Left atrial size was normal in size. Right Atrium: Right atrial size was normal in size. Pericardium: There is no evidence of pericardial effusion. Mitral Valve: The mitral valve is abnormal. There is mild thickening of the mitral valve leaflet(s). There is mild calcification of the mitral valve leaflet(s). Trivial mitral valve regurgitation. No evidence of mitral valve stenosis. MV peak gradient, 7.1 mmHg. The mean mitral valve gradient is 1.0 mmHg. Tricuspid Valve: The tricuspid valve is normal in structure. Tricuspid valve regurgitation is trivial. No evidence of tricuspid stenosis. Aortic Valve: The aortic valve is tricuspid. There is mild calcification of the aortic valve. Aortic valve regurgitation is not visualized. Aortic  valve sclerosis is present, with no evidence of aortic valve stenosis. Aortic valve mean gradient measures 7.5 mmHg. Aortic valve peak gradient measures 14.7 mmHg. Aortic valve area, by VTI measures 1.83 cm. Pulmonic Valve: The pulmonic valve was normal in structure. Pulmonic valve  regurgitation is not visualized. No evidence of pulmonic stenosis. Aorta: The aortic root is normal in size and structure. Venous: The inferior vena cava is normal in size with greater than 50% respiratory variability, suggesting right atrial pressure of 3 mmHg. IAS/Shunts: No atrial level shunt detected by color flow Doppler.  LEFT VENTRICLE PLAX 2D LVIDd:         5.00 cm   Diastology LVIDs:         3.20 cm   LV e' medial:    10.00 cm/s LV PW:         1.20 cm   LV E/e' medial:  11.7 LV IVS:        1.20 cm   LV e' lateral:   16.50 cm/s LVOT diam:     2.00 cm   LV E/e' lateral: 7.1 LV SV:         90 LV SV Index:   41 LVOT Area:     3.14 cm  RIGHT VENTRICLE RV Basal diam:  3.60 cm RV Mid diam:    2.70 cm RV S prime:     14.30 cm/s TAPSE (M-mode): 2.3 cm LEFT ATRIUM             Index        RIGHT ATRIUM           Index LA diam:        3.60 cm 1.61 cm/m   RA Area:     18.30 cm LA Vol (A2C):   55.0 ml 24.67 ml/m  RA Volume:   51.30 ml  23.01 ml/m LA Vol (A4C):   54.2 ml 24.31 ml/m LA Biplane Vol: 57.9 ml 25.97 ml/m  AORTIC VALVE                     PULMONIC VALVE AV Area (Vmax):    1.60 cm      PV Vmax:       0.72 m/s AV Area (Vmean):   1.72 cm      PV Peak grad:  2.1 mmHg AV Area (VTI):     1.83 cm AV Vmax:           191.50 cm/s AV Vmean:          119.500 cm/s AV VTI:            0.496 m AV Peak Grad:      14.7 mmHg AV Mean Grad:      7.5 mmHg LVOT Vmax:         97.30 cm/s LVOT Vmean:        65.500 cm/s LVOT VTI:          0.288 m LVOT/AV VTI ratio: 0.58  AORTA Ao Root diam: 2.80 cm Ao Asc diam:  3.00 cm MITRAL VALVE                TRICUSPID VALVE MV Area (PHT): 3.68 cm     TR Peak grad:   20.8 mmHg MV Area VTI:   2.07 cm     TR Vmax:        228.00 cm/s MV Peak grad:  7.1 mmHg MV Mean grad:  1.0 mmHg  SHUNTS MV Vmax:       1.33 m/s     Systemic VTI:  0.29 m MV Vmean:      49.5 cm/s    Systemic Diam: 2.00 cm MV Decel Time: 206 msec MV E velocity: 117.00 cm/s MV A velocity: 80.10 cm/s MV E/A ratio:  1.46  Jenkins Rouge MD Electronically signed by Jenkins Rouge MD Signature Date/Time: 01/27/2021/2:35:12 PM    Final     Microbiology: Recent Results (from the past 240 hour(s))  Resp Panel by RT-PCR (Flu A&B, Covid) Nasopharyngeal Swab     Status: None   Collection Time: 01/26/21 12:18 PM   Specimen: Nasopharyngeal Swab; Nasopharyngeal(NP) swabs in vial transport medium  Result Value Ref Range Status   SARS Coronavirus 2 by RT PCR NEGATIVE NEGATIVE Final    Comment: (NOTE) SARS-CoV-2 target nucleic acids are NOT DETECTED.  The SARS-CoV-2 RNA is generally detectable in upper respiratory specimens during the acute phase of infection. The lowest concentration of SARS-CoV-2 viral copies this assay can detect is 138 copies/mL. A negative result does not preclude SARS-Cov-2 infection and should not be used as the sole basis for treatment or other patient management decisions. A negative result may occur with  improper specimen collection/handling, submission of specimen other than nasopharyngeal swab, presence of viral mutation(s) within the areas targeted by this assay, and inadequate number of viral copies(<138 copies/mL). A negative result must be combined with clinical observations, patient history, and epidemiological information. The expected result is Negative.  Fact Sheet for Patients:  EntrepreneurPulse.com.au  Fact Sheet for Healthcare Providers:  IncredibleEmployment.be  This test is no t yet approved or cleared by the [place] FDA and  has been authorized for detection and/or diagnosis of SARS-CoV-2 by FDA under an Emergency Use Authorization (EUA). This EUA will remain  in effect (meaning this test can be used) for the duration of the COVID-19 declaration under Section 564(b)(1) of the Act, 21 U.S.C.section 360bbb-3(b)(1), unless the authorization is terminated  or revoked sooner.       Influenza A by PCR NEGATIVE NEGATIVE Final    Influenza B by PCR NEGATIVE NEGATIVE Final    Comment: (NOTE) The Xpert Xpress SARS-CoV-2/FLU/RSV plus assay is intended as an aid in the diagnosis of influenza from Nasopharyngeal swab specimens and should not be used as a sole basis for treatment. Nasal washings and aspirates are unacceptable for Xpert Xpress SARS-CoV-2/FLU/RSV testing.  Fact Sheet for Patients: EntrepreneurPulse.com.au  Fact Sheet for Healthcare Providers: IncredibleEmployment.be  This test is not yet approved or cleared by the [place] FDA and has been authorized for detection and/or diagnosis of SARS-CoV-2 by FDA under an Emergency Use Authorization (EUA). This EUA will remain in effect (meaning this test can be used) for the duration of the COVID-19 declaration under Section 564(b)(1) of the Act, 21 U.S.C. section 360bbb-3(b)(1), unless the authorization is terminated or revoked.  Performed at [facility], [address] [place], [place] [address]      Labs: Basic Metabolic Panel: Recent Labs  Lab 01/26/21 1150 01/28/21 0430  NA 138 139  K 4.5 5.1  CL 107 106  CO2 23 25  GLUCOSE 283* 123*  BUN 31* 33*  CREATININE 1.46* 1.49*  CALCIUM 8.6* 8.3*   Liver Function Tests: No results for input(s): AST, ALT, ALKPHOS, BILITOT, PROT, ALBUMIN in the last 168 hours. No results for input(s): LIPASE, AMYLASE in the last 168 hours. No results for input(s): AMMONIA in the last 168 hours. CBC: Recent  Labs  Lab 01/26/21 1150 01/28/21 0430  WBC 12.4* 10.6*  HGB 13.4 10.8*  HCT 42.0 34.9*  MCV 96.8 95.6  PLT 223 208   Cardiac Enzymes: No results for input(s): CKTOTAL, CKMB, CKMBINDEX, TROPONINI in the last 168 hours. BNP: Invalid input(s): POCBNP CBG: Recent Labs  Lab 01/27/21 1152 01/27/21 1701 01/27/21 2102 01/28/21 0715 01/28/21 1106  GLUCAP 179* 161* 171* 123* 135*    Time coordinating discharge:  36 minutes  Signed:  Orson Eva, DO [facility] Pager: [phone] 01/28/2021, 12:46 PM

## 2021-01-30 LAB — URINE CULTURE: Culture: >=100000 — AB

## 2021-02-02 ENCOUNTER — Ambulatory Visit: Payer: Medicare HMO | Admitting: Urology

## 2021-02-02 DIAGNOSIS — R3912 Poor urinary stream: Secondary | ICD-10-CM

## 2021-02-02 DIAGNOSIS — N390 Urinary tract infection, site not specified: Secondary | ICD-10-CM

## 2021-02-06 DIAGNOSIS — R069 Unspecified abnormalities of breathing: Secondary | ICD-10-CM | POA: Diagnosis not present

## 2021-02-06 DIAGNOSIS — I10 Essential (primary) hypertension: Secondary | ICD-10-CM | POA: Diagnosis not present

## 2021-02-06 DIAGNOSIS — R531 Weakness: Secondary | ICD-10-CM | POA: Diagnosis not present

## 2021-02-07 ENCOUNTER — Encounter (HOSPITAL_COMMUNITY): Payer: Self-pay | Admitting: Emergency Medicine

## 2021-02-07 ENCOUNTER — Other Ambulatory Visit: Payer: Self-pay

## 2021-02-07 ENCOUNTER — Emergency Department (HOSPITAL_COMMUNITY)
Admission: EM | Admit: 2021-02-07 | Discharge: 2021-02-07 | Disposition: A | Discharge status: Home / Self Care | Payer: Medicare HMO | Attending: Emergency Medicine | Admitting: Emergency Medicine

## 2021-02-07 ENCOUNTER — Emergency Department (HOSPITAL_COMMUNITY): Payer: Medicare HMO

## 2021-02-07 DIAGNOSIS — Z79899 Other long term (current) drug therapy: Secondary | ICD-10-CM | POA: Diagnosis not present

## 2021-02-07 DIAGNOSIS — Z7982 Long term (current) use of aspirin: Secondary | ICD-10-CM | POA: Diagnosis not present

## 2021-02-07 DIAGNOSIS — J90 Pleural effusion, not elsewhere classified: Secondary | ICD-10-CM | POA: Diagnosis not present

## 2021-02-07 DIAGNOSIS — N189 Chronic kidney disease, unspecified: Secondary | ICD-10-CM | POA: Diagnosis not present

## 2021-02-07 DIAGNOSIS — R0602 Shortness of breath: Secondary | ICD-10-CM | POA: Diagnosis not present

## 2021-02-07 DIAGNOSIS — I129 Hypertensive chronic kidney disease with stage 1 through stage 4 chronic kidney disease, or unspecified chronic kidney disease: Secondary | ICD-10-CM | POA: Insufficient documentation

## 2021-02-07 DIAGNOSIS — J189 Pneumonia, unspecified organism: Secondary | ICD-10-CM

## 2021-02-07 DIAGNOSIS — R5381 Other malaise: Secondary | ICD-10-CM | POA: Diagnosis not present

## 2021-02-07 DIAGNOSIS — Z794 Long term (current) use of insulin: Secondary | ICD-10-CM | POA: Insufficient documentation

## 2021-02-07 DIAGNOSIS — R531 Weakness: Secondary | ICD-10-CM | POA: Diagnosis not present

## 2021-02-07 DIAGNOSIS — E1122 Type 2 diabetes mellitus with diabetic chronic kidney disease: Secondary | ICD-10-CM | POA: Insufficient documentation

## 2021-02-07 DIAGNOSIS — R918 Other nonspecific abnormal finding of lung field: Secondary | ICD-10-CM | POA: Diagnosis not present

## 2021-02-07 DIAGNOSIS — Z20822 Contact with and (suspected) exposure to covid-19: Secondary | ICD-10-CM | POA: Diagnosis not present

## 2021-02-07 DIAGNOSIS — Z7401 Bed confinement status: Secondary | ICD-10-CM | POA: Diagnosis not present

## 2021-02-07 LAB — BASIC METABOLIC PANEL
Anion gap: 6 (ref 5–15)
BUN: 33 mg/dL — ABNORMAL HIGH (ref 8–23)
CO2: 24 mmol/L (ref 22–32)
Calcium: 8.5 mg/dL — ABNORMAL LOW (ref 8.9–10.3)
Chloride: 106 mmol/L (ref 98–111)
Creatinine, Ser: 1.67 mg/dL — ABNORMAL HIGH (ref 0.61–1.24)
GFR, Estimated: 39 mL/min — ABNORMAL LOW (ref >60)
Glucose, Bld: 236 mg/dL — ABNORMAL HIGH (ref 70–99)
Potassium: 3.8 mmol/L (ref 3.5–5.1)
Sodium: 136 mmol/L (ref 135–145)

## 2021-02-07 LAB — BRAIN NATRIURETIC PEPTIDE: B Natriuretic Peptide: 160 pg/mL — ABNORMAL HIGH (ref 0.0–100.0)

## 2021-02-07 LAB — CBC WITH DIFFERENTIAL/PLATELET
Abs Immature Granulocytes: 0.11 10*3/uL — ABNORMAL HIGH (ref 0.00–0.07)
Basophils Absolute: 0.1 10*3/uL (ref 0.0–0.1)
Basophils Relative: 0 %
Eosinophils Absolute: 0.2 10*3/uL (ref 0.0–0.5)
Eosinophils Relative: 2 %
HCT: 34.6 % — ABNORMAL LOW (ref 39.0–52.0)
Hemoglobin: 11.2 g/dL — ABNORMAL LOW (ref 13.0–17.0)
Immature Granulocytes: 1 %
Lymphocytes Relative: 15 %
Lymphs Abs: 2.1 10*3/uL (ref 0.7–4.0)
MCH: 30.4 pg (ref 26.0–34.0)
MCHC: 32.4 g/dL (ref 30.0–36.0)
MCV: 94 fL (ref 80.0–100.0)
Monocytes Absolute: 1.4 10*3/uL — ABNORMAL HIGH (ref 0.1–1.0)
Monocytes Relative: 10 %
Neutro Abs: 9.6 10*3/uL — ABNORMAL HIGH (ref 1.7–7.7)
Neutrophils Relative %: 72 %
Platelets: 305 10*3/uL (ref 150–400)
RBC: 3.68 MIL/uL — ABNORMAL LOW (ref 4.22–5.81)
RDW: 12.2 % (ref 11.5–15.5)
WBC: 13.4 10*3/uL — ABNORMAL HIGH (ref 4.0–10.5)
nRBC: 0 % (ref 0.0–0.2)

## 2021-02-07 LAB — RESP PANEL BY RT-PCR (FLU A&B, COVID) ARPGX2
Influenza A by PCR: NEGATIVE
Influenza B by PCR: NEGATIVE
SARS Coronavirus 2 by RT PCR: NEGATIVE

## 2021-02-07 MED ORDER — DOXYCYCLINE HYCLATE 100 MG PO CAPS: 100.0000 mg | ORAL_CAPSULE | Freq: Two times a day (BID) | ORAL | 0 refills | Status: DC

## 2021-02-07 MED ORDER — AMOXICILLIN-POT CLAVULANATE 875-125 MG PO TABS
1.0000 | ORAL_TABLET | Freq: Once | ORAL | Status: AC
  Administered 2021-02-07: 1 via ORAL
  Filled 2021-02-07: qty 1

## 2021-02-07 MED ORDER — DOXYCYCLINE HYCLATE 100 MG PO TABS
100.0000 mg | ORAL_TABLET | Freq: Once | ORAL | Status: AC
  Administered 2021-02-07: 100 mg via ORAL
  Filled 2021-02-07: qty 1

## 2021-02-07 MED ORDER — AMOXICILLIN-POT CLAVULANATE 875-125 MG PO TABS: 1.0000 | ORAL_TABLET | Freq: Two times a day (BID) | ORAL | 0 refills | Status: DC

## 2021-02-07 NOTE — Discharge Instructions (Signed)
You were seen today for shortness of breath.  You may have an early pneumonia.  Will be discharged with antibiotic.  Take as prescribed.  If you develop any new or worsening symptoms, you should be reevaluated.

## 2021-02-07 NOTE — ED Triage Notes (Signed)
Pt arrives via [facility] from home. C/o of SOB that started about an hour ago. Per ems, vs are [facility].

## 2021-02-07 NOTE — ED Provider Notes (Signed)
[facility] EMERGENCY DEPARTMENT Provider Note   CSN: [id number] Arrival date & time: 02/07/21  0006     History  Chief Complaint  Patient presents with   Shortness of Breath    Mike Briggs is a 88 y.o. male.  HPI     This is a 88 year old male with a history of diabetes, hypertension, chronic kidney disease who presents with shortness of breath.  Patient is a generally poor historian.  He is brought in by EMS from home.  Stated he had onset of shortness of breath approximately 1 hour prior to arrival.  Denies chest pain.  Denies recent illness, fever, cough.  Has noted some ankle soreness and potential swelling.  Does not particularly note that his shortness of breath is worse with lying flat.  Of note, recent hospital stay for 2 nights at the end of December for chest pain.  Work-up at that time negative.  Home Medications Prior to Admission medications   Medication Sig Start Date End Date Taking? Authorizing Provider  amLODipine (NORVASC) 10 MG tablet TAKE ONE TABLET BY MOUTH ONCE DAILY 01/25/21   [name], MD  aspirin 81 MG chewable tablet Chew 1 tablet (81 mg total) by mouth daily with breakfast. 12/04/19   Perlie Mayo, NP  atorvastatin (LIPITOR) 10 MG tablet TAKE 1 TABLET BY MOUTH AFTER SUPPER. Patient taking differently: Take 10 mg by mouth daily. 01/19/21   [name], MD  blood glucose meter kit and supplies Dispense based on patient and insurance preference. Use up to four times daily as directed. (FOR ICD-10 E10.9, E11.9). 04/05/20   [name], MD  blood glucose meter kit and supplies Dispense based on patient and insurance preference. Use up to four times daily as directed. (FOR ICD-10 E10.9, E11.9). 12/06/20   [name], MD  gabapentin (NEURONTIN) 100 MG capsule TAKE 1 CAPSULE BY MOUTH THREE TIMES A DAY. Patient taking differently: Take 100 mg by mouth 3 (three) times daily. 07/25/20   Noreene Larsson, NP  hydrALAZINE (APRESOLINE) 10 MG tablet  Take 1 tablet (10 mg total) by mouth 2 (two) times daily. 01/28/21   Orson Eva, MD  LANTUS SOLOSTAR 100 UNIT/ML Solostar Pen Inject 20 Units into the skin daily. 10/10/20   [name], MD  memantine (NAMENDA) 10 MG tablet TAKE (1) TABLET BY MOUTH TWICE DAILY. Patient taking differently: Take 10 mg by mouth 2 (two) times daily. 01/19/21   [name], MD  mupirocin cream (BACTROBAN) 2 % Apply 1 application topically 2 (two) times daily. Patient not taking: Reported on 01/26/2021 04/05/20   [name], MD  pantoprazole (PROTONIX) 40 MG tablet Take 1 tablet (40 mg total) by mouth daily. 01/28/21   Orson Eva, MD  SURE COMFORT PEN NEEDLES 31G X 5 MM MISC USE TO INJECT AS DIRECTED FOUR TIMES A DAY. WITH MEALS AND BEFORE BEDTIME. 08/17/20   [name], MD  tamsulosin (FLOMAX) 0.4 MG CAPS capsule TAKE 1 CAPSULE BY MOUTH DAILY AFTER BREAKFAST. 01/27/21   Irine Seal, MD      Allergies    Patient has no known allergies.    Review of Systems   Review of Systems  Constitutional:  Negative for fever.  Respiratory:  Positive for shortness of breath. Negative for cough.   Cardiovascular:  Negative for chest pain.  Gastrointestinal:  Negative for abdominal pain, nausea and vomiting.  All other systems reviewed and are negative.  Physical Exam Updated  Vital Signs BP (!) 167/86    Pulse (!) 107    Temp 99.1 F (37.3 C) (Oral)    Resp 17    Ht 1.803 m ($Remove'5\' 11"'HEWxzxm$ )    Wt 103.4 kg    SpO2 98%    BMI 31.79 kg/m  Physical Exam Vitals and nursing note reviewed.  Constitutional:      Appearance: He is well-developed. He is obese.     Comments: Chronically ill-appearing but nontoxic  HENT:     Head: Normocephalic and atraumatic.  Eyes:     Pupils: Pupils are equal, round, and reactive to light.  Neck:     Vascular: No JVD.  Cardiovascular:     Rate and Rhythm: Normal rate. Rhythm irregular.     Heart sounds: Normal heart sounds. No murmur heard. Pulmonary:     Effort: Pulmonary effort is  normal. No respiratory distress.     Breath sounds: Normal breath sounds. No wheezing.     Comments: Distant breath sounds in all lung fields given body habitus Abdominal:     General: Bowel sounds are normal.     Palpations: Abdomen is soft.     Tenderness: There is no abdominal tenderness. There is no rebound.  Musculoskeletal:     Cervical back: Neck supple.     Right lower leg: No edema.     Left lower leg: No edema.     Comments: Trace bilateral lower extremity edema  Lymphadenopathy:     Cervical: No cervical adenopathy.  Skin:    General: Skin is warm and dry.  Neurological:     Mental Status: He is alert and oriented to person, place, and time.  Psychiatric:        Mood and Affect: Mood normal.    ED Results / Procedures / Treatments   Labs (all labs ordered are listed, but only abnormal results are displayed) Labs Reviewed  CBC WITH DIFFERENTIAL/PLATELET - Abnormal; Notable for the following components:      Result Value   WBC 13.4 (*)    RBC 3.68 (*)    Hemoglobin 11.2 (*)    HCT 34.6 (*)    Neutro Abs 9.6 (*)    Monocytes Absolute 1.4 (*)    Abs Immature Granulocytes 0.11 (*)    All other components within normal limits  BASIC METABOLIC PANEL - Abnormal; Notable for the following components:   Glucose, Bld 236 (*)    BUN 33 (*)    Creatinine, Ser 1.67 (*)    Calcium 8.5 (*)    GFR, Estimated 39 (*)    All other components within normal limits  BRAIN NATRIURETIC PEPTIDE - Abnormal; Notable for the following components:   B Natriuretic Peptide 160.0 (*)    All other components within normal limits  RESP PANEL BY RT-PCR (FLU A&B, COVID) [id number]    EKG EKG Interpretation  Date/Time:  Tuesday February 07 2021 00:14:22 EST Ventricular Rate:  62 PR Interval:  218 QRS Duration: 88 QT Interval:  455 QTC Calculation: 463 R Axis:   -17 Text Interpretation: Sinus rhythm Supraventricular bigeminy Borderline prolonged PR interval Borderline left axis deviation  Abnormal R-wave progression, early transition Nonspecific T abnormalities, lateral leads Baseline wander in lead(s) V6 Confirmed by Thayer Jew [phone]) on 02/07/2021 1:05:53 AM  Radiology DG Chest Portable 1 View  Result Date: 02/07/2021 CLINICAL DATA:  Shortness of breath EXAM: PORTABLE CHEST 1 VIEW COMPARISON:  01/26/2021 FINDINGS: Heart is borderline in size. Right basilar opacity, favor  atelectasis. Possible small right effusion. Left lung clear. No acute bony abnormality. IMPRESSION: Right basilar opacity, likely atelectasis with small right effusion. Electronically Signed   By: Rolm Baptise M.D.   On: 02/07/2021 00:59    Procedures Procedures    Medications Ordered in ED Medications  amoxicillin-clavulanate (AUGMENTIN) 875-125 MG per tablet 1 tablet (has no administration in time range)  doxycycline (VIBRA-TABS) tablet 100 mg (has no administration in time range)    ED Course/ Medical Decision Making/ A&P                           Medical Decision Making  This patient presents to the ED for concern of shortness of breath, this involves an extensive number of treatment options, and is a complaint that carries with it a high risk of complications and morbidity.  The differential diagnosis includes heart failure, infection, viral etiology, less likely PE.  MDM:    This is a 88 year old male who presents with shortness of breath.  He is nontoxic.  Vital signs are notable for blood pressure 150/65.  He is afebrile and not hypoxic.  His physical exam is fairly benign although limited secondary to body habitus.  He does not appear overtly volume overloaded.  Denies any other infectious risk factors or symptoms although he is a generally poor historian.  Labs and work-up obtained.  Patient with leukocytosis to 13.4.  Creatinine 1.67 just up from baseline.  BNP 160.  COVID and influenza testing negative.  Chest x-ray shows a right basilar opacity concerning for possible atelectasis with  small right pleural effusion.  Infection would also be of concern.  EKG without acute ischemic changes.  He does appear to have some supraventricular bigeminy.  Given clinical picture, would treat for pneumonia given x-ray findings and leukocytosis in the setting of shortness of breath.  Patient does not ambulate and uses a motorized wheelchair to get around. (Labs, imaging)  Labs: I Ordered, and personally interpreted labs.  The pertinent results include: Leukocytosis, creatinine 1.67.  Negative viral testing   Imaging Studies ordered: I ordered imaging studies including right lower lobe infiltrate I independently visualized and interpreted imaging. I agree with the radiologist interpretation  Critical Interventions: Antibiotic   Consultations Obtained: I requested consultation with the NA,  and discussed lab and imaging findings as well as pertinent plan - they recommend: N/A  Reevaluation: After the interventions noted above, I reevaluated the patient and found that they have :stayed the same  Social Determinants of Health: Lives alone  Disposition:  home  Co morbidities that complicate the patient evaluation  Past Medical History:  Diagnosis Date   Acute cystitis 12/17/2018   Acute encephalopathy 11/14/2018   ANKLE, ARTHRITIS, DEGEN./OSTEO 12/16/2008   Qualifier: Diagnosis of  By: Aline Brochure MD, Stanley     Arthritis    Cataract    Chronic kidney disease    COVID-19    COVID-19 virus detected 11/03/2018   COVID-19 virus infection 11/03/2018   Dehydration 11/02/2018   Depression    Diabetes mellitus    Gram-positive bacteremia 11/03/2018   Hyperlipidemia    Hypertension    Hypomagnesemia 03/24/2019   Klebsiella UTI  03/25/2019   Pain in finger of right hand 05/27/2019   Personal history of gout 05/22/2016   Rhabdomyolysis 11/02/2018   Stroke ([place])    Swelling of both hands 05/27/2019   Syncope 02/25/2018   TIA (transient ischemic attack) 11/14/2018   UTI (urinary  tract  infection) 02/26/2018   Vitamin D deficiency 05/31/2016      Additional history obtained from chart review External records from outside source obtained and reviewed including n/q   Cardiac Monitoring: The patient was maintained on a cardiac monitor.  I personally viewed and interpreted the cardiac monitored which showed an underlying rhythm of: Sinus rhythm with supraventricular bigeminy   Medicines  Meds ordered this encounter  Medications   amoxicillin-clavulanate (AUGMENTIN) 875-125 MG per tablet 1 tablet   doxycycline (VIBRA-TABS) tablet 100 mg     I have reviewed the patients home medicines and have made adjustments as needed   Problem List / ED Course: Problem List Items Addressed This Visit   None Visit Diagnoses     SOB (shortness of breath)    -  Primary   Community acquired pneumonia, unspecified laterality       Relevant Medications   amoxicillin-clavulanate (AUGMENTIN) 875-125 MG per tablet 1 tablet (Start on 02/07/2021  2:00 AM)   doxycycline (VIBRA-TABS) tablet 100 mg (Start on 02/07/2021  2:00 AM)                   Final Clinical Impression(s) / ED Diagnoses Final diagnoses:  SOB (shortness of breath)  Community acquired pneumonia, unspecified laterality    Rx / DC Orders ED Discharge Orders     None         Jihan Mellette, Barbette Hair, MD 02/07/21 [phone]

## 2021-02-22 ENCOUNTER — Other Ambulatory Visit: Payer: Self-pay | Admitting: Internal Medicine

## 2021-02-22 ENCOUNTER — Other Ambulatory Visit: Payer: Self-pay | Admitting: Urology

## 2021-02-22 DIAGNOSIS — R339 Retention of urine, unspecified: Secondary | ICD-10-CM

## 2021-02-24 ENCOUNTER — Emergency Department (HOSPITAL_COMMUNITY): Payer: Medicare HMO

## 2021-02-24 ENCOUNTER — Emergency Department (HOSPITAL_COMMUNITY)
Admission: EM | Admit: 2021-02-24 | Discharge: 2021-02-24 | Disposition: A | Discharge status: Home / Self Care | Payer: Medicare HMO | Attending: Emergency Medicine | Admitting: Emergency Medicine

## 2021-02-24 ENCOUNTER — Other Ambulatory Visit: Payer: Self-pay

## 2021-02-24 ENCOUNTER — Encounter (HOSPITAL_COMMUNITY): Payer: Self-pay

## 2021-02-24 DIAGNOSIS — Z794 Long term (current) use of insulin: Secondary | ICD-10-CM | POA: Insufficient documentation

## 2021-02-24 DIAGNOSIS — R2231 Localized swelling, mass and lump, right upper limb: Secondary | ICD-10-CM | POA: Diagnosis not present

## 2021-02-24 DIAGNOSIS — F0390 Unspecified dementia without behavioral disturbance: Secondary | ICD-10-CM | POA: Insufficient documentation

## 2021-02-24 DIAGNOSIS — I129 Hypertensive chronic kidney disease with stage 1 through stage 4 chronic kidney disease, or unspecified chronic kidney disease: Secondary | ICD-10-CM | POA: Diagnosis not present

## 2021-02-24 DIAGNOSIS — M79641 Pain in right hand: Secondary | ICD-10-CM | POA: Diagnosis not present

## 2021-02-24 DIAGNOSIS — Z79899 Other long term (current) drug therapy: Secondary | ICD-10-CM | POA: Insufficient documentation

## 2021-02-24 DIAGNOSIS — D72829 Elevated white blood cell count, unspecified: Secondary | ICD-10-CM | POA: Insufficient documentation

## 2021-02-24 DIAGNOSIS — M7989 Other specified soft tissue disorders: Secondary | ICD-10-CM | POA: Diagnosis not present

## 2021-02-24 DIAGNOSIS — Z7982 Long term (current) use of aspirin: Secondary | ICD-10-CM | POA: Diagnosis not present

## 2021-02-24 DIAGNOSIS — N183 Chronic kidney disease, stage 3 unspecified: Secondary | ICD-10-CM | POA: Insufficient documentation

## 2021-02-24 DIAGNOSIS — E1122 Type 2 diabetes mellitus with diabetic chronic kidney disease: Secondary | ICD-10-CM | POA: Diagnosis not present

## 2021-02-24 DIAGNOSIS — R600 Localized edema: Secondary | ICD-10-CM | POA: Diagnosis not present

## 2021-02-24 DIAGNOSIS — R52 Pain, unspecified: Secondary | ICD-10-CM | POA: Diagnosis not present

## 2021-02-24 DIAGNOSIS — R609 Edema, unspecified: Secondary | ICD-10-CM | POA: Diagnosis not present

## 2021-02-24 LAB — CBC WITH DIFFERENTIAL/PLATELET
Abs Immature Granulocytes: 0.08 10*3/uL — ABNORMAL HIGH (ref 0.00–0.07)
Basophils Absolute: 0.1 10*3/uL (ref 0.0–0.1)
Basophils Relative: 0 %
Eosinophils Absolute: 0.2 10*3/uL (ref 0.0–0.5)
Eosinophils Relative: 1 %
HCT: 35.3 % — ABNORMAL LOW (ref 39.0–52.0)
Hemoglobin: 11.1 g/dL — ABNORMAL LOW (ref 13.0–17.0)
Immature Granulocytes: 1 %
Lymphocytes Relative: 9 %
Lymphs Abs: 1.3 10*3/uL (ref 0.7–4.0)
MCH: 29.8 pg (ref 26.0–34.0)
MCHC: 31.4 g/dL (ref 30.0–36.0)
MCV: 94.9 fL (ref 80.0–100.0)
Monocytes Absolute: 0.8 10*3/uL (ref 0.1–1.0)
Monocytes Relative: 5 %
Neutro Abs: 12.9 10*3/uL — ABNORMAL HIGH (ref 1.7–7.7)
Neutrophils Relative %: 84 %
Platelets: 341 10*3/uL (ref 150–400)
RBC: 3.72 MIL/uL — ABNORMAL LOW (ref 4.22–5.81)
RDW: 12 % (ref 11.5–15.5)
WBC: 15.4 10*3/uL — ABNORMAL HIGH (ref 4.0–10.5)
nRBC: 0 % (ref 0.0–0.2)

## 2021-02-24 LAB — COMPREHENSIVE METABOLIC PANEL
ALT: 14 U/L (ref 0–44)
AST: 15 U/L (ref 15–41)
Albumin: 2.4 g/dL — ABNORMAL LOW (ref 3.5–5.0)
Alkaline Phosphatase: 114 U/L (ref 38–126)
Anion gap: 6 (ref 5–15)
BUN: 23 mg/dL (ref 8–23)
CO2: 28 mmol/L (ref 22–32)
Calcium: 8.6 mg/dL — ABNORMAL LOW (ref 8.9–10.3)
Chloride: 100 mmol/L (ref 98–111)
Creatinine, Ser: 1.24 mg/dL (ref 0.61–1.24)
GFR, Estimated: 55 mL/min — ABNORMAL LOW (ref >60)
Glucose, Bld: 232 mg/dL — ABNORMAL HIGH (ref 70–99)
Potassium: 3.8 mmol/L (ref 3.5–5.1)
Sodium: 134 mmol/L — ABNORMAL LOW (ref 135–145)
Total Bilirubin: 0.5 mg/dL (ref 0.3–1.2)
Total Protein: 7.8 g/dL (ref 6.5–8.1)

## 2021-02-24 MED ORDER — MELOXICAM 7.5 MG PO TABS: 7.5000 mg | ORAL_TABLET | Freq: Every day | ORAL | 0 refills | Status: DC

## 2021-02-24 NOTE — Discharge Instructions (Addendum)
Please follow up with your PCP for further evaluation of your hand swelling. Pick up the medication and take as prescribed.   Return to the ED for any new/worsening symptoms

## 2021-02-24 NOTE — ED Triage Notes (Signed)
Patient brought in via ems from home. Patient c/o right hand pain and swelling for 1 week. Pulses intact

## 2021-02-24 NOTE — ED Notes (Signed)
Called patient's contacts to inform them patient was placed for discharged. States that they will make some calls and call ED back. Patient states that he needs to return home via EMS.

## 2021-02-24 NOTE — ED Notes (Signed)
Family called and states that patient needs to return home via Ems.

## 2021-02-24 NOTE — ED Provider Notes (Signed)
[facility] EMERGENCY DEPARTMENT Provider Note   CSN: [id number] Arrival date & time: 02/24/21  1024     History  Chief Complaint  Patient presents with   Hand Problem   LEVEL 5 CAVEAT - DEMENTIA  Mike Briggs is a 88 y.o. male with PMHx HTN, HLD, Vascular dementia, CKD stage III, Diabetes who presents to the ED today via EMS with complaint of R hand pain/swelling x 1 week. Pt denies any known injury to the hand including trauma or insect bite. He is a poor historian and unable to provide additional details at this time.   The history is provided by the patient.      Home Medications Prior to Admission medications   Medication Sig Start Date End Date Taking? Authorizing Provider  meloxicam (MOBIC) 7.5 MG tablet Take 1 tablet (7.5 mg total) by mouth daily. 02/24/21 03/26/21 Yes Heela Heishman, PA-C  amLODipine (NORVASC) 10 MG tablet TAKE ONE TABLET BY MOUTH ONCE DAILY 01/25/21   [name], MD  amoxicillin-clavulanate (AUGMENTIN) 875-125 MG tablet Take 1 tablet by mouth every 12 (twelve) hours. 02/07/21   Horton, Barbette Hair, MD  aspirin 81 MG chewable tablet Chew 1 tablet (81 mg total) by mouth daily with breakfast. 12/04/19   Perlie Mayo, NP  atorvastatin (LIPITOR) 10 MG tablet TAKE 1 TABLET BY MOUTH AFTER SUPPER. 02/22/21   [name], MD  blood glucose meter kit and supplies Dispense based on patient and insurance preference. Use up to four times daily as directed. (FOR ICD-10 E10.9, E11.9). 04/05/20   [name], MD  blood glucose meter kit and supplies Dispense based on patient and insurance preference. Use up to four times daily as directed. (FOR ICD-10 E10.9, E11.9). 12/06/20   [name], MD  doxycycline (VIBRAMYCIN) 100 MG capsule Take 1 capsule (100 mg total) by mouth 2 (two) times daily. 02/07/21   Horton, Barbette Hair, MD  gabapentin (NEURONTIN) 100 MG capsule TAKE 1 CAPSULE BY MOUTH THREE TIMES A DAY. Patient taking differently: Take 100 mg by mouth 3  (three) times daily. 07/25/20   Noreene Larsson, NP  hydrALAZINE (APRESOLINE) 10 MG tablet Take 1 tablet (10 mg total) by mouth 2 (two) times daily. 01/28/21   Orson Eva, MD  LANTUS SOLOSTAR 100 UNIT/ML Solostar Pen Inject 20 Units into the skin daily. 10/10/20   [name], MD  lisinopril (ZESTRIL) 40 MG tablet TAKE (1) TABLET BY MOUTH ONCE DAILY. 02/22/21   [name], MD  memantine (NAMENDA) 10 MG tablet TAKE (1) TABLET BY MOUTH TWICE DAILY. 02/22/21   [name], MD  mupirocin cream (BACTROBAN) 2 % Apply 1 application topically 2 (two) times daily. Patient not taking: Reported on 01/26/2021 04/05/20   [name], MD  pantoprazole (PROTONIX) 40 MG tablet Take 1 tablet (40 mg total) by mouth daily. 01/28/21   Orson Eva, MD  SURE COMFORT PEN NEEDLES 31G X 5 MM MISC USE TO INJECT AS DIRECTED FOUR TIMES A DAY. WITH MEALS AND BEFORE BEDTIME. 08/17/20   [name], MD  tamsulosin (FLOMAX) 0.4 MG CAPS capsule TAKE 1 CAPSULE BY MOUTH DAILY AFTER BREAKFAST. 02/23/21   Irine Seal, MD      Allergies    Patient has no known allergies.    Review of Systems   Review of Systems  Unable to perform ROS: Dementia  Musculoskeletal:  Positive for arthralgias and joint swelling.   Physical Exam Updated Vital Signs  BP 135/82    Pulse 94    Temp 98.5 F (36.9 C) (Oral)    Resp 16    Ht 5' 11" (1.803 m)    Wt 104.3 kg    SpO2 100%    BMI 32.08 kg/m  Physical Exam Vitals and nursing note reviewed.  Constitutional:      Appearance: He is not ill-appearing or diaphoretic.  HENT:     Head: Normocephalic and atraumatic.  Eyes:     Conjunctiva/sclera: Conjunctivae normal.  Cardiovascular:     Rate and Rhythm: Normal rate and regular rhythm.     Pulses: Normal pulses.  Pulmonary:     Effort: Pulmonary effort is normal.     Breath sounds: Normal breath sounds.  Abdominal:     Palpations: Abdomen is soft.     Tenderness: There is no abdominal tenderness.  Musculoskeletal:     Cervical  back: Neck supple.     Comments: Significant amount of swelling noted to R wrist/R hand with diffuse TTP. Swelling does appear to extend into mid forearm compared to LUE. No overlying erythema or increased warmth. 2+ radial pulse.   Skin:    General: Skin is warm and dry.  Neurological:     Mental Status: He is alert.    ED Results / Procedures / Treatments   Labs (all labs ordered are listed, but only abnormal results are displayed) Labs Reviewed  COMPREHENSIVE METABOLIC PANEL - Abnormal; Notable for the following components:      Result Value   Sodium 134 (*)    Glucose, Bld 232 (*)    Calcium 8.6 (*)    Albumin 2.4 (*)    GFR, Estimated 55 (*)    All other components within normal limits  CBC WITH DIFFERENTIAL/PLATELET - Abnormal; Notable for the following components:   WBC 15.4 (*)    RBC 3.72 (*)    Hemoglobin 11.1 (*)    HCT 35.3 (*)    Neutro Abs 12.9 (*)    Abs Immature Granulocytes 0.08 (*)    All other components within normal limits    EKG None  Radiology US Venous Img Upper Right (DVT Study)  Result Date: 02/24/2021 CLINICAL DATA:  RIGHT hand swelling x1 week EXAM: RIGHT UPPER EXTREMITY VENOUS DOPPLER ULTRASOUND TECHNIQUE: Gray-scale sonography with graded compression, as well as color Doppler and duplex ultrasound were performed to evaluate the upper extremity deep venous system from the level of the subclavian vein and including the jugular, axillary, basilic, radial, ulnar and upper cephalic vein. Spectral Doppler was utilized to evaluate flow at rest and with distal augmentation maneuvers. COMPARISON:  RIGHT hand XRs, concurrent.  Chest XR, 02/07/2021. FINDINGS: VENOUS Normal compressibility of the RIGHT internal jugular, subclavian, axillary, cephalic, basilic, brachial, radial and ulnar veins. No filling defects to suggest DVT on grayscale or color Doppler imaging. Doppler waveforms show normal direction of venous flow, normal respiratory plasticity and response  to augmentation. Limited views of the contralateral subclavian vein are unremarkable. OTHER No evidence of superficial thrombophlebitis or abnormal fluid collection. Subcutaneous edema of imaged distal RIGHT extremity Limitations: none IMPRESSION: No evidence of DVT within the RIGHT upper extremity. Michaelle Birks, MD [facility] Radiology Electronically Signed   By: Michaelle Birks M.D.   On: 02/24/2021 12:28   DG Hand Complete Right  Result Date: 02/24/2021 CLINICAL DATA:  Hand swelling for 1 week.  No known injury. EXAM: RIGHT HAND - COMPLETE 3+ VIEW COMPARISON:  Radiographs  05/27/2019. FINDINGS: Suboptimal evaluation of the fingers due to flexion, similar to the previous study. The mineralization and alignment are normal. There is no evidence of acute fracture or dislocation. There is no evidence of bone destruction. Mild degenerative changes are present throughout the interphalangeal joints and radial aspect of the wrist. 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 The soft tissues are diffusely prominent, especially dorsally. This appears similar to the prior study. No evidence of foreign body. IMPRESSION: 1. Nonspecific diffuse soft tissue prominence, similar to previous study. No evidence of foreign body or soft tissue emphysema. 2. No acute osseous findings.  Stable mild degenerative changes. Electronically Signed   By: Richardean Sale M.D.   On: 02/24/2021 11:41    Procedures Procedures    Medications Ordered in ED Medications - No data to display  ED Course/ Medical Decision Making/ A&P                           Medical Decision Making 88 year old male who presents to the ED today with complaint of right hand swelling/pain for approximately 1 week.  He is unable to provide additional details given history of vascular dementia.  She lives at home alone.  On arrival to the ED vitals are stable.   Patient appears to be no acute distress.  He is noted to have significant swelling to the right hand and right wrist however does appear to extend up into the mid forearm.  No overlying skin changes.  He was neurovascular intact throughout.  We will plan for x-ray for further evaluation however will add on labs and DVT study at this time as patient is unable to tell me exactly what happened to his hand.   Workup overall reassuring without acute findings at this time. Per chart review it does appear pt has been having similar issues of bilateral hand swelling in the past and been followed with PCP. He is taking meloxicam for polyarthritis. Recommend continuation of same and PCP follow up. Do not feel he requires additional medical workup in the ED today.   Problems Addressed: Swelling of right hand: chronic illness or injury    Details: Per chart review appears chronic. Workup reassuring today without acute findings on labs and images (xray and DVT study). Suspect swelling s/2 polyarthritis. Recommend continuation of meloxicam. Refill sent in for pt.  Amount and/or Complexity of Data Reviewed Labs: ordered.    Details: CBC with leukocytosis 15,400 (13.4 two weeks ago per chart review). Hgb stable at 11.1.  CMP with glucose 232. Sodium 134. No other electrolyte abnormalities. Radiology: ordered.    Details: Xray of hand with nonspecific swelling which appears similar to previous xray performed in 2021. No acute bony abnormalities.   DVT study negative ECG/medicine tests: ordered.          Final Clinical Impression(s) / ED Diagnoses Final diagnoses:  Swelling of right hand    Rx / DC Orders ED Discharge Orders          Ordered    meloxicam (MOBIC) 7.5 MG tablet  Daily        02/24/21 1305             Discharge Instructions      Please follow up with your PCP for further evaluation of your hand swelling. Pick up the medication and take as prescribed.   Return to the ED  for any new/worsening symptoms  Eustaquio Maize, PA-C 02/24/21 1305    Milton Ferguson, MD 02/25/21 0830

## 2021-03-02 ENCOUNTER — Other Ambulatory Visit: Payer: Self-pay

## 2021-03-02 ENCOUNTER — Ambulatory Visit (INDEPENDENT_AMBULATORY_CARE_PROVIDER_SITE_OTHER): Payer: Medicare HMO

## 2021-03-02 DIAGNOSIS — Z0000 Encounter for general adult medical examination without abnormal findings: Secondary | ICD-10-CM

## 2021-03-02 NOTE — Progress Notes (Signed)
Subjective:   Mike Briggs is a 88 y.o. male who presents for Medicare Annual/Subsequent preventive examination. I connected with  [name] on 03/02/21 by a audio enabled telemedicine application and verified that I am speaking with the correct person using two identifiers.  Patient Location: Home  Provider Location: Office/Clinic  I discussed the limitations of evaluation and management by telemedicine. The patient expressed understanding and agreed to proceed.  Review of Systems    Defer to PCP Cardiac Risk Factors include: none;advanced age (>87men, >4 women);diabetes mellitus;hypertension;male gender;sedentary lifestyle     Objective:    Today's Vitals   03/02/21 8756  PainSc: 10-Worst pain ever   There is no height or weight on file to calculate BMI.  Advanced Directives 03/02/2021 02/24/2021 02/07/2021 01/26/2021 11/22/2020 02/04/2020 09/16/2019  Does Patient Have a Medical Advance Directive? Yes No No No No No No  Type of Advance Directive [place]  Does patient want to make changes to medical advance directive? - - - - - - -  Copy of [place] in Chart? No - copy requested - - - - - -  Would patient like information on creating a medical advance directive? - - No - Patient declined - No - Patient declined No - Patient declined -  Pre-existing out of facility DNR order (yellow form or pink MOST form) - - - - - - -    Current Medications (verified) Outpatient Encounter Medications as of 03/02/2021  Medication Sig   amLODipine (NORVASC) 10 MG tablet TAKE ONE TABLET BY MOUTH ONCE DAILY   amoxicillin-clavulanate (AUGMENTIN) 875-125 MG tablet Take 1 tablet by mouth every 12 (twelve) hours.   aspirin 81 MG chewable tablet Chew 1 tablet (81 mg total) by mouth daily with breakfast.   atorvastatin (LIPITOR) 10 MG tablet TAKE 1 TABLET BY MOUTH AFTER SUPPER.   blood glucose meter kit and supplies Dispense based on patient  and insurance preference. Use up to four times daily as directed. (FOR ICD-10 E10.9, E11.9).   blood glucose meter kit and supplies Dispense based on patient and insurance preference. Use up to four times daily as directed. (FOR ICD-10 E10.9, E11.9).   doxycycline (VIBRAMYCIN) 100 MG capsule Take 1 capsule (100 mg total) by mouth 2 (two) times daily.   gabapentin (NEURONTIN) 100 MG capsule TAKE 1 CAPSULE BY MOUTH THREE TIMES A DAY. (Patient taking differently: Take 100 mg by mouth 3 (three) times daily.)   hydrALAZINE (APRESOLINE) 10 MG tablet Take 1 tablet (10 mg total) by mouth 2 (two) times daily.   LANTUS SOLOSTAR 100 UNIT/ML Solostar Pen Inject 20 Units into the skin daily.   lisinopril (ZESTRIL) 40 MG tablet TAKE (1) TABLET BY MOUTH ONCE DAILY.   meloxicam (MOBIC) 7.5 MG tablet Take 1 tablet (7.5 mg total) by mouth daily.   memantine (NAMENDA) 10 MG tablet TAKE (1) TABLET BY MOUTH TWICE DAILY.   pantoprazole (PROTONIX) 40 MG tablet Take 1 tablet (40 mg total) by mouth daily.   SURE COMFORT PEN NEEDLES 31G X 5 MM MISC USE TO INJECT AS DIRECTED FOUR TIMES A DAY. WITH MEALS AND BEFORE BEDTIME.   tamsulosin (FLOMAX) 0.4 MG CAPS capsule TAKE 1 CAPSULE BY MOUTH DAILY AFTER BREAKFAST.   mupirocin cream (BACTROBAN) 2 % Apply 1 application topically 2 (two) times daily. (Patient not taking: Reported on 01/26/2021)   No facility-administered encounter medications on file as of 03/02/2021.  Allergies (verified) Patient has no known allergies.   History: Past Medical History:  Diagnosis Date   Acute cystitis 12/17/2018   Acute encephalopathy 11/14/2018   ANKLE, ARTHRITIS, DEGEN./OSTEO 12/16/2008   Qualifier: Diagnosis of  By: Aline Brochure MD, [place]     Arthritis    Cataract    Chronic kidney disease    COVID-19    COVID-19 virus detected 11/03/2018   COVID-19 virus infection 11/03/2018   Dehydration 11/02/2018   Depression    Diabetes mellitus    Gram-positive bacteremia 11/03/2018    Hyperlipidemia    Hypertension    Hypomagnesemia 03/24/2019   Klebsiella UTI  03/25/2019   Pain in finger of right hand 05/27/2019   Personal history of gout 05/22/2016   Rhabdomyolysis 11/02/2018   Stroke ([place])    Swelling of both hands 05/27/2019   Syncope 02/25/2018   TIA (transient ischemic attack) 11/14/2018   UTI (urinary tract infection) 02/26/2018   Vitamin D deficiency 05/31/2016   Past Surgical History:  Procedure Laterality Date   BACK SURGERY     cervical and lumbar   CATARACT EXTRACTION     CHOLECYSTECTOMY     FOOT SURGERY     5th toe    SPINE SURGERY     lumbar and cervical fusions   Family History  Family history unknown: Yes   Social History   Socioeconomic History   Marital status: Divorced    Spouse name: Not on file   Number of children: 3   Years of education: 10   Highest education level: Not on file  Occupational History   Occupation: Retired.  Tobacco Use   Smoking status: Former    Types: Cigarettes    Quit date: 08/07/1967    Years since quitting: 53.6   Smokeless tobacco: Never  Vaping Use   Vaping Use: Never used  Substance and Sexual Activity   Alcohol use: No   Drug use: No   Sexual activity: Not Currently  Other Topics Concern   Not on file  Social History Narrative   Wheelchair bound.Has not walked in many years, at least five years.  Lives alone. Has 2 aides that come in to help him.   Lives alone.    Retired.   Eats all food groups.    Has 3 children but does not have contact with them.   Divorced.   Social Determinants of Health   Financial Resource Strain: Low Risk    Difficulty of Paying Living Expenses: Not hard at all  Food Insecurity: No Food Insecurity   Worried About Charity fundraiser in the Last Year: Never true   [place] in the Last Year: Never true  Transportation Needs: No Transportation Needs   Lack of Transportation (Medical): No   Lack of Transportation (Non-Medical): No  Physical Activity: Unknown    Days of Exercise per Week: Patient refused   Minutes of Exercise per Session: Not on file  Stress: Stress Concern Present   Feeling of Stress : To some extent  Social Connections: Socially Isolated   Frequency of Communication with Friends and Family: Never   Frequency of Social Gatherings with Friends and Family: Once a week   Attends Religious Services: Never   Marine scientist or Organizations: No   Attends Music therapist: Never   Marital Status: Divorced    Tobacco Counseling Counseling given: Not Answered   Clinical Intake:  Pre-visit preparation completed: No  Pain : 0-10  Pain Score: 10-Worst pain ever Pain Location: Hand Pain Orientation: Other (Comment) Pain Descriptors / Indicators: Sore Pain Onset: More than a month ago Pain Frequency: Constant Pain Relieving Factors: nothing  Pain Relieving Factors: nothing  Diabetes: Yes CBG done?: No Did pt. bring in CBG monitor from home?: No  How often do you need to have someone help you when you read instructions, pamphlets, or other written materials from your doctor or pharmacy?: 3 - Sometimes What is the last grade level you completed in school?: Pt states he didi not go to school  Diabetic?Nutrition Risk Assessment:  Has the patient had any N/V/D within the last 2 months?  No  Does the patient have any non-healing wounds?  No  Has the patient had any unintentional weight loss or weight gain?  No   Diabetes:  Is the patient diabetic?  Yes  If diabetic, was a CBG obtained today?  No  Did the patient bring in their glucometer from home?  No  How often do you monitor your CBG's? Up to 4 times a day.   Financial Strains and Diabetes Management:  Are you having any financial strains with the device, your supplies or your medication? No .  Does the patient want to be seen by Chronic Care Management for management of their diabetes?  No  Would the patient like to be referred to a Nutritionist  or for Diabetic Management?  No   Diabetic Exams:  Diabetic Eye Exam: Overdue for diabetic eye exam. Pt has been advised about the importance in completing this exam. Patient advised to call and schedule an eye exam. Diabetic Foot Exam: Overdue, Pt has been advised about the importance in completing this exam. Pt is scheduled for diabetic foot exam on 03/08/2021.   Interpreter Needed?: No  Information entered by :: Judeen Hammans   Activities of Daily Living In your present state of health, do you have any difficulty performing the following activities: 03/02/2021 01/26/2021  Hearing? N Y  Vision? N Y  Difficulty concentrating or making decisions? Tempie Donning  Walking or climbing stairs? Y Y  Dressing or bathing? Y Y  Doing errands, shopping? - Y  Preparing Food and eating ? Y -  Using the Toilet? Y -  In the past six months, have you accidently leaked urine? Y -  Do you have problems with loss of bowel control? Y -  Managing your Medications? Y -  Managing your Finances? Y -  Housekeeping or managing your Housekeeping? Y -  Some recent data might be hidden    Patient Care Team: [name], MD as PCP - General (Internal Medicine)  Indicate any recent Medical Services you may have received from other than [facility] providers in the past year (date may be approximate).     Assessment:   This is a routine wellness examination for Arlie.  Hearing/Vision screen No results found.  Dietary issues and exercise activities discussed: Current Exercise Habits: The patient does not participate in regular exercise at present   Goals Addressed             This Visit's Progress    TAKE MEDICINE EVERY DAY   On track     Depression Screen PHQ 2/9 Scores 03/02/2021 12/06/2020 11/22/2020 04/05/2020 03/08/2020 12/10/2019 07/09/2019  PHQ - 2 Score 0 0 0 0 0 0 0  PHQ- 9 Score - - - - - - 2    Fall Risk Fall Risk  03/02/2021 12/06/2020 11/22/2020 04/05/2020 03/08/2020  Falls in the past year? 0 0 0 0 1   Number falls in past yr: 0 0 0 0 0  Injury with Fall? 0 0 0 0 0  Risk for fall due to : History of fall(s);Impaired mobility No Fall Risks No Fall Risks No Fall Risks History of fall(s);Impaired balance/gait;Impaired mobility  Follow up Falls prevention discussed Falls evaluation completed Falls evaluation completed Falls evaluation completed Falls evaluation completed;Falls prevention discussed;Education provided;Follow up appointment    FALL RISK PREVENTION PERTAINING TO THE HOME:  Any stairs in or around the home? No  If so, are there any without handrails? No  Home free of loose throw rugs in walkways, pet beds, electrical cords, etc? No  Adequate lighting in your home to reduce risk of falls? Yes   ASSISTIVE DEVICES UTILIZED TO PREVENT FALLS:  Life alert? Yes  Use of a cane, walker or w/c? Yes  Grab bars in the bathroom? Yes  Shower chair or bench in shower? Yes  Elevated toilet seat or a handicapped toilet? Yes    Cognitive Function:     6CIT Screen 03/02/2021  What Year? 4 points  What month? 0 points  What time? 0 points  Count back from 20 0 points  Months in reverse 4 points  Repeat phrase 2 points  Total Score 10    Immunizations Immunization History  Administered Date(s) Administered   Fluad Quad(high Dose 65+) 12/10/2019, 12/06/2020   Influenza,inj,Quad PF,6+ Mos 10/21/2012, 12/25/2016   Pneumococcal Conjugate-13 12/25/2016   Pneumococcal Polysaccharide-23 10/21/2012, 03/25/2019   Td 09/06/2017    TDAP status: Up to date  Flu Vaccine status: Up to date  Pneumococcal vaccine status: Up to date  Covid-19 vaccine status: Information provided on how to obtain vaccines.   Qualifies for Shingles Vaccine? Yes   Zostavax completed No   Shingrix Completed?: No.    Education has been provided regarding the importance of this vaccine. Patient has been advised to call insurance company to determine out of pocket expense if they have not yet received this  vaccine. Advised may also receive vaccine at local pharmacy or Health Dept. Verbalized acceptance and understanding.  Screening Tests Health Maintenance  Topic Date Due   COVID-19 Vaccine (1) Never done   Zoster Vaccines- Shingrix (1 of 2) Never done   OPHTHALMOLOGY EXAM  12/25/2016   FOOT EXAM  12/25/2017   HEMOGLOBIN A1C  06/05/2021   TETANUS/TDAP  09/07/2027   Pneumonia Vaccine 33+ Years old  Completed   INFLUENZA VACCINE  Completed   HPV VACCINES  Aged Out    Health Maintenance  Health Maintenance Due  Topic Date Due   COVID-19 Vaccine (1) Never done   Zoster Vaccines- Shingrix (1 of 2) Never done   OPHTHALMOLOGY EXAM  12/25/2016   FOOT EXAM  12/25/2017    Colorectal cancer screening: No longer required.   Lung Cancer Screening: (Low Dose CT Chest recommended if Age 30-80 years, 30 pack-year currently smoking OR have quit w/in 15years.) does not qualify.   Lung Cancer Screening Referral: n/a  Additional Screening:  Hepatitis C Screening: does not qualify; Completed not at high risks  Vision Screening: Recommended annual ophthalmology exams for early detection of glaucoma and other disorders of the eye. Is the patient up to date with their annual eye exam?  No  Who is the provider or what is the name of the office in which the patient attends annual eye exams? Retina specialist If pt is not established with a  provider, would they like to be referred to a provider to establish care? No .   Dental Screening: Recommended annual dental exams for proper oral hygiene  Community Resource Referral / Chronic Care Management: CRR required this visit?  No   CCM required this visit?  No      Plan:     I have personally reviewed and noted the following in the patients chart:   Medical and social history Use of alcohol, tobacco or illicit drugs  Current medications and supplements including opioid prescriptions. Patient is not currently taking opioid  prescriptions. Functional ability and status Nutritional status Physical activity Advanced directives List of other physicians Hospitalizations, surgeries, and ER visits in previous 12 months Vitals Screenings to include cognitive, depression, and falls Referrals and appointments  In addition, I have reviewed and discussed with patient certain preventive protocols, quality metrics, and best practice recommendations. A written personalized care plan for preventive services as well as general preventive health recommendations were provided to patient.     Mike Briggs, [place]   03/02/2021   Nurse Notes:  Mr. Hoying , Thank you for taking time to come for your Medicare Wellness Visit. I appreciate your ongoing commitment to your health goals. Please review the following plan we discussed and let me know if I can assist you in the future.   These are the goals we discussed:  Goals      TAKE MEDICINE EVERY DAY        This is a list of the screening recommended for you and due dates:  Health Maintenance  Topic Date Due   COVID-19 Vaccine (1) Never done   Zoster (Shingles) Vaccine (1 of 2) Never done   Eye exam for diabetics  12/25/2016   Complete foot exam   12/25/2017   Hemoglobin A1C  06/05/2021   Tetanus Vaccine  09/07/2027   Pneumonia Vaccine  Completed   Flu Shot  Completed   HPV Vaccine  Aged Out

## 2021-03-02 NOTE — Patient Instructions (Signed)

## 2021-03-08 ENCOUNTER — Encounter: Payer: Self-pay | Admitting: Internal Medicine

## 2021-03-08 ENCOUNTER — Ambulatory Visit (INDEPENDENT_AMBULATORY_CARE_PROVIDER_SITE_OTHER): Payer: Medicare HMO | Admitting: Internal Medicine

## 2021-03-08 ENCOUNTER — Other Ambulatory Visit: Payer: Self-pay

## 2021-03-08 VITALS — BP 128/62 | HR 109

## 2021-03-08 DIAGNOSIS — Z09 Encounter for follow-up examination after completed treatment for conditions other than malignant neoplasm: Secondary | ICD-10-CM

## 2021-03-08 DIAGNOSIS — F01B0 Vascular dementia, moderate, without behavioral disturbance, psychotic disturbance, mood disturbance, and anxiety: Secondary | ICD-10-CM | POA: Diagnosis not present

## 2021-03-08 DIAGNOSIS — I10 Essential (primary) hypertension: Secondary | ICD-10-CM | POA: Diagnosis not present

## 2021-03-08 DIAGNOSIS — R5381 Other malaise: Secondary | ICD-10-CM | POA: Diagnosis not present

## 2021-03-08 DIAGNOSIS — M130 Polyarthritis, unspecified: Secondary | ICD-10-CM | POA: Diagnosis not present

## 2021-03-08 DIAGNOSIS — I69351 Hemiplegia and hemiparesis following cerebral infarction affecting right dominant side: Secondary | ICD-10-CM

## 2021-03-08 DIAGNOSIS — R531 Weakness: Secondary | ICD-10-CM | POA: Diagnosis not present

## 2021-03-08 DIAGNOSIS — I441 Atrioventricular block, second degree: Secondary | ICD-10-CM

## 2021-03-08 MED ORDER — GABAPENTIN 300 MG PO CAPS: 300.0000 mg | ORAL_CAPSULE | Freq: Every day | ORAL | 1 refills | Status: DC

## 2021-03-08 MED ORDER — MELOXICAM 7.5 MG PO TABS: 7.5000 mg | ORAL_TABLET | Freq: Every day | ORAL | 2 refills | Status: DC

## 2021-03-08 NOTE — Patient Instructions (Addendum)
Please take Meloxicam for wrist pain and swelling.  Please take Gabapentin 300 mg at nighttime to help with neuropathic pain.  You are being referred to physical therapy for swelling of the arm.

## 2021-03-08 NOTE — Assessment & Plan Note (Signed)
On Namenda Dependent for ADLs, has home health aide

## 2021-03-08 NOTE — Progress Notes (Signed)
° °Established Patient Office Visit ° °Subjective:  °Patient ID: Mike Briggs, male    DOB: 08/20/1928  Age: 88 y.o. MRN: [id number] ° °CC:  °Chief Complaint  °Patient presents with  ° Hand Pain  °  Swelling and pain of right hand. Was seen in the ER 1/27  ° ° °HPI °Mike Briggs is a 88 y.o. male with past medical history of HTN, DM with CKD stage 3, Mobitz type 1 second degree AV block, dementia and BPH who presents for f/u after recent ER visit. ° °He went to ER recently for right hand pain and swelling, which is chronic.  He had x-ray of the right hand, which showed chronic soft tissue swelling and arthritis.  US Doppler of the RUE was negative for DVT.  He was given meloxicam for arthritis, which is his chronic medication for polyarthritis.  He denies any recent injury or fall.  Denies any recent insect bite. ° ° ° °Past Medical History:  °Diagnosis Date  ° Acute cystitis 12/17/2018  ° Acute encephalopathy 11/14/2018  ° ANKLE, ARTHRITIS, DEGEN./OSTEO 12/16/2008  ° Qualifier: Diagnosis of  By: Harrison MD, Stanley    ° Arthritis   ° Cataract   ° Chronic kidney disease   ° COVID-19   ° COVID-19 virus detected 11/03/2018  ° COVID-19 virus infection 11/03/2018  ° Dehydration 11/02/2018  ° Depression   ° Diabetes mellitus   ° Gram-positive bacteremia 11/03/2018  ° Hyperlipidemia   ° Hypertension   ° Hypomagnesemia 03/24/2019  ° Klebsiella UTI  03/25/2019  ° Pain in finger of right hand 05/27/2019  ° Personal history of gout 05/22/2016  ° Rhabdomyolysis 11/02/2018  ° Stroke (HCC)   ° Swelling of both hands 05/27/2019  ° Syncope 02/25/2018  ° TIA (transient ischemic attack) 11/14/2018  ° UTI (urinary tract infection) 02/26/2018  ° Vitamin D deficiency 05/31/2016  ° ° °Past Surgical History:  °Procedure Laterality Date  ° BACK SURGERY    ° cervical and lumbar  ° CATARACT EXTRACTION    ° CHOLECYSTECTOMY    ° FOOT SURGERY    ° 5th toe   ° SPINE SURGERY    ° lumbar and cervical fusions  ° ° °Family History  °Family history  unknown: Yes  ° ° °Social History  ° °Socioeconomic History  ° Marital status: Divorced  °  Spouse name: Not on file  ° Number of children: 3  ° Years of education: 10  ° Highest education level: Not on file  °Occupational History  ° Occupation: Retired.  °Tobacco Use  ° Smoking status: Former  °  Types: Cigarettes  °  Quit date: 08/07/1967  °  Years since quitting: 53.6  ° Smokeless tobacco: Never  °Vaping Use  ° Vaping Use: Never used  °Substance and Sexual Activity  ° Alcohol use: No  ° Drug use: No  ° Sexual activity: Not Currently  °Other Topics Concern  ° Not on file  °Social History Narrative  ° Wheelchair bound.Has not walked in many years, at least five years.  Lives alone. Has 2 aides that come in to help him.  ° Lives alone.   ° Retired.  ° Eats all food groups.   ° Has 3 children but does not have contact with them.  ° Divorced.  ° °Social Determinants of Health  ° °Financial Resource Strain: Low Risk   ° Difficulty of Paying Living Expenses: Not hard at all  °Food Insecurity: No Food Insecurity  ° Worried   About Running Out of Food in the Last Year: Never true  ° Ran Out of Food in the Last Year: Never true  °Transportation Needs: No Transportation Needs  ° Lack of Transportation (Medical): No  ° Lack of Transportation (Non-Medical): No  °Physical Activity: Unknown  ° Days of Exercise per Week: Patient refused  ° Minutes of Exercise per Session: Not on file  °Stress: Stress Concern Present  ° Feeling of Stress : To some extent  °Social Connections: Socially Isolated  ° Frequency of Communication with Friends and Family: Never  ° Frequency of Social Gatherings with Friends and Family: Once a week  ° Attends Religious Services: Never  ° Active Member of Clubs or Organizations: No  ° Attends Club or Organization Meetings: Never  ° Marital Status: Divorced  °Intimate Partner Violence: Not At Risk  ° Fear of Current or Ex-Partner: No  ° Emotionally Abused: No  ° Physically Abused: No  ° Sexually Abused: No   ° ° °Outpatient Medications Prior to Visit  °Medication Sig Dispense Refill  ° amLODipine (NORVASC) 10 MG tablet TAKE ONE TABLET BY MOUTH ONCE DAILY 30 tablet 0  ° aspirin 81 MG chewable tablet Chew 1 tablet (81 mg total) by mouth daily with breakfast. 30 tablet 3  ° atorvastatin (LIPITOR) 10 MG tablet TAKE 1 TABLET BY MOUTH AFTER SUPPER. 30 tablet 0  ° blood glucose meter kit and supplies Dispense based on patient and insurance preference. Use up to four times daily as directed. (FOR ICD-10 E10.9, E11.9). 1 each 0  ° LANTUS SOLOSTAR 100 UNIT/ML Solostar Pen Inject 20 Units into the skin daily. 15 mL 3  ° lisinopril (ZESTRIL) 40 MG tablet TAKE (1) TABLET BY MOUTH ONCE DAILY. 30 tablet 0  ° memantine (NAMENDA) 10 MG tablet TAKE (1) TABLET BY MOUTH TWICE DAILY. 60 tablet 0  ° mupirocin cream (BACTROBAN) 2 % Apply 1 application topically 2 (two) times daily. 15 g 0  ° pantoprazole (PROTONIX) 40 MG tablet Take 1 tablet (40 mg total) by mouth daily. 30 tablet 2  ° SURE COMFORT PEN NEEDLES 31G X 5 MM MISC USE TO INJECT AS DIRECTED FOUR TIMES A DAY. WITH MEALS AND BEFORE BEDTIME. 100 each 0  ° tamsulosin (FLOMAX) 0.4 MG CAPS capsule TAKE 1 CAPSULE BY MOUTH DAILY AFTER BREAKFAST. 30 capsule 0  ° amoxicillin-clavulanate (AUGMENTIN) 875-125 MG tablet Take 1 tablet by mouth every 12 (twelve) hours. 14 tablet 0  ° doxycycline (VIBRAMYCIN) 100 MG capsule Take 1 capsule (100 mg total) by mouth 2 (two) times daily. 14 capsule 0  ° gabapentin (NEURONTIN) 100 MG capsule TAKE 1 CAPSULE BY MOUTH THREE TIMES A DAY. (Patient taking differently: Take 100 mg by mouth 3 (three) times daily.) 90 capsule 0  ° hydrALAZINE (APRESOLINE) 10 MG tablet Take 1 tablet (10 mg total) by mouth 2 (two) times daily. 60 tablet 2  ° meloxicam (MOBIC) 7.5 MG tablet Take 1 tablet (7.5 mg total) by mouth daily. 30 tablet 0  ° blood glucose meter kit and supplies Dispense based on patient and insurance preference. Use up to four times daily as directed. (FOR  ICD-10 E10.9, E11.9). 1 each 0  ° °No facility-administered medications prior to visit.  ° ° °No Known Allergies ° °ROS °Review of Systems  °Constitutional:  Negative for chills and fever.  °HENT:  Negative for congestion and sore throat.   °Eyes:  Negative for pain and discharge.  °Respiratory:  Negative for cough and shortness of breath.   °  Cardiovascular:  Negative for chest pain and palpitations.  °Gastrointestinal:  Negative for constipation, diarrhea, nausea and vomiting.  °Endocrine: Negative for polydipsia and polyuria.  °Genitourinary:  Negative for dysuria and hematuria.  °Musculoskeletal:  Positive for arthralgias. Negative for neck pain and neck stiffness.  °Skin:  Positive for wound. Negative for rash.  °Neurological:  Positive for weakness (B/l LE) and numbness (R foot). Negative for dizziness and headaches.  °Psychiatric/Behavioral:  Negative for agitation and behavioral problems.   ° °  °Objective:  °  °Physical Exam °Vitals reviewed.  °Constitutional:   °   General: He is not in acute distress. °   Appearance: He is not diaphoretic.  °HENT:  °   Head: Normocephalic and atraumatic.  °   Nose: Nose normal.  °   Mouth/Throat:  °   Mouth: Mucous membranes are moist.  °Eyes:  °   General: No scleral icterus. °   Extraocular Movements: Extraocular movements intact.  °Cardiovascular:  °   Rate and Rhythm: Normal rate and regular rhythm.  °   Heart sounds: Normal heart sounds. No murmur heard. °Pulmonary:  °   Breath sounds: Normal breath sounds. No wheezing or rales.  °Abdominal:  °   Palpations: Abdomen is soft.  °   Tenderness: There is no abdominal tenderness.  °Musculoskeletal:     °   General: Swelling (R wrist and hand) present.  °   Cervical back: Neck supple. No tenderness.  °   Right lower leg: Edema (2+) present.  °   Left lower leg: Edema (2+) present.  °Skin: °   General: Skin is warm.  °   Comments:  °  °Neurological:  °   General: No focal deficit present.  °   Mental Status: He is alert and  oriented to person, place, and time.  °   Sensory: Sensory deficit (R foot) present.  °   Motor: Weakness (B/l LE) present.  °Psychiatric:     °   Mood and Affect: Mood normal.     °   Behavior: Behavior normal.  ° ° °BP 128/62    Pulse (!) 109    SpO2 97%  °Wt Readings from Last 3 Encounters:  °02/24/21 230 lb (104.3 kg)  °02/07/21 227 lb 15.3 oz (103.4 kg)  °01/26/21 227 lb 15.3 oz (103.4 kg)  ° ° °Lab Results  °Component Value Date  ° TSH 0.849 12/18/2018  ° °Lab Results  °Component Value Date  ° WBC 15.4 (H) 02/24/2021  ° HGB 11.1 (L) 02/24/2021  ° HCT 35.3 (L) 02/24/2021  ° MCV 94.9 02/24/2021  ° PLT 341 02/24/2021  ° °Lab Results  °Component Value Date  ° NA 134 (L) 02/24/2021  ° K 3.8 02/24/2021  ° CO2 28 02/24/2021  ° GLUCOSE 232 (H) 02/24/2021  ° BUN 23 02/24/2021  ° CREATININE 1.24 02/24/2021  ° BILITOT 0.5 02/24/2021  ° ALKPHOS 114 02/24/2021  ° AST 15 02/24/2021  ° ALT 14 02/24/2021  ° PROT 7.8 02/24/2021  ° ALBUMIN 2.4 (L) 02/24/2021  ° CALCIUM 8.6 (L) 02/24/2021  ° ANIONGAP 6 02/24/2021  ° EGFR 41 (L) 12/06/2020  ° °Lab Results  °Component Value Date  ° CHOL 138 12/06/2020  ° °Lab Results  °Component Value Date  ° HDL 37 (L) 12/06/2020  ° °Lab Results  °Component Value Date  ° LDLCALC 83 12/06/2020  ° °Lab Results  °Component Value Date  ° TRIG 93 12/06/2020  ° °Lab Results  °Component   Value Date  ° CHOLHDL 3.7 12/06/2020  ° °Lab Results  °Component Value Date  ° HGBA1C 6.8 (H) 12/06/2020  ° ° °  °Assessment & Plan:  ° °Problem List Items Addressed This Visit   ° °  ° Cardiovascular and Mediastinum  ° Essential hypertension  °  BP Readings from Last 1 Encounters:  °03/08/21 128/62  °Well-controlled with Lisinopril 40 mg QD and Amlodipine 10 mg QD °Hydralazine was recently added during hospitalization instead of Lisinopril due to hyperkalemia at that time, was given both upon discharge, discontinued today as his BP was stable only with Amlodipine and Lisinopril previously °Counseled for compliance with  the medications °  °  ° Mobitz type 1 second degree atrioventricular block  °  Has h/o second degree AV block °Had irregular beats on physical exam today °EKG: Sinus tachycardia with PVCs. First degree AV block. Similar compared to prior. °  °  °  ° Nervous and Auditory  ° Vascular dementia (HCC)  °  On Namenda °Dependent for ADLs, has home health aide °  °  ° Relevant Medications  ° gabapentin (NEURONTIN) 300 MG capsule  ° Other Relevant Orders  ° Ambulatory referral to Home Health  ° Hemiparesis affecting right side as late effect of cerebrovascular accident (CVA) (HCC)  °  On aspirin and statin °No recent change in neurologic function °Referred to home health for PT/OT to help with mobility and chronic RUE swelling °  °  ° Relevant Orders  ° EKG 12-Lead (Completed)  ° Ambulatory referral to Home Health  °  ° Musculoskeletal and Integument  ° Polyarthritis  °  Chronic wrist, ankle and knee pain °On Mobic currently, unable to increase dose due to CKD °Increased dose of gabapentin to 300 mg nightly °  °  ° Relevant Medications  ° meloxicam (MOBIC) 7.5 MG tablet  ° gabapentin (NEURONTIN) 300 MG capsule  ° Other Relevant Orders  ° Ambulatory referral to Home Health  °  ° Other  ° Encounter for examination following treatment at hospital - Primary  °  ER chart reviewed including imaging °His RUE swelling is likely due to posture and arthritis.  He has residual right-sided hemiplegia from old CVA and does not move his RUE much. °Mobic as needed for wrist pain °  °  ° ° °Meds ordered this encounter  °Medications  ° meloxicam (MOBIC) 7.5 MG tablet  °  Sig: Take 1 tablet (7.5 mg total) by mouth daily.  °  Dispense:  30 tablet  °  Refill:  2  ° gabapentin (NEURONTIN) 300 MG capsule  °  Sig: Take 1 capsule (300 mg total) by mouth at bedtime.  °  Dispense:  90 capsule  °  Refill:  1  ° ° °Follow-up: Return in about 3 months (around 06/05/2021), or if symptoms worsen or fail to improve.  ° ° ° K , MD °

## 2021-03-08 NOTE — Assessment & Plan Note (Signed)
BP Readings from Last 1 Encounters:  03/08/21 128/62   Well-controlled with Lisinopril 40 mg QD and Amlodipine 10 mg QD Hydralazine was recently added during hospitalization instead of Lisinopril due to hyperkalemia at that time, was given both upon discharge, discontinued today as his BP was stable only with Amlodipine and Lisinopril previously Counseled for compliance with the medications

## 2021-03-08 NOTE — Assessment & Plan Note (Addendum)
ER chart reviewed including imaging His RUE swelling is likely due to posture and arthritis.  He has residual right-sided hemiplegia from old CVA and does not move his RUE much. Mobic as needed for wrist pain

## 2021-03-08 NOTE — Assessment & Plan Note (Signed)
Chronic wrist, ankle and knee pain On Mobic currently, unable to increase dose due to CKD Increased dose of gabapentin to 300 mg nightly

## 2021-03-08 NOTE — Assessment & Plan Note (Signed)
On aspirin and statin No recent change in neurologic function Referred to home health for PT/OT to help with mobility and chronic RUE swelling

## 2021-03-08 NOTE — Assessment & Plan Note (Addendum)
Has h/o second degree AV block Had irregular beats on physical exam today EKG: Sinus tachycardia with PVCs. First degree AV block. Similar compared to prior.

## 2021-03-12 ENCOUNTER — Emergency Department (HOSPITAL_COMMUNITY): Payer: Medicare HMO

## 2021-03-12 ENCOUNTER — Inpatient Hospital Stay (HOSPITAL_COMMUNITY)
Admission: EM | Admit: 2021-03-12 | Discharge: 2021-03-16 | DRG: 871 | Disposition: A | Discharge status: Home Health | Payer: Medicare HMO | Attending: Internal Medicine | Admitting: Internal Medicine

## 2021-03-12 ENCOUNTER — Encounter (HOSPITAL_COMMUNITY): Payer: Self-pay | Admitting: Emergency Medicine

## 2021-03-12 ENCOUNTER — Other Ambulatory Visit: Payer: Self-pay

## 2021-03-12 DIAGNOSIS — Z8616 Personal history of COVID-19: Secondary | ICD-10-CM | POA: Diagnosis not present

## 2021-03-12 DIAGNOSIS — K219 Gastro-esophageal reflux disease without esophagitis: Secondary | ICD-10-CM | POA: Diagnosis present

## 2021-03-12 DIAGNOSIS — N1831 Chronic kidney disease, stage 3a: Secondary | ICD-10-CM | POA: Diagnosis not present

## 2021-03-12 DIAGNOSIS — M1990 Unspecified osteoarthritis, unspecified site: Secondary | ICD-10-CM | POA: Diagnosis present

## 2021-03-12 DIAGNOSIS — G9341 Metabolic encephalopathy: Secondary | ICD-10-CM | POA: Diagnosis not present

## 2021-03-12 DIAGNOSIS — F32A Depression, unspecified: Secondary | ICD-10-CM | POA: Diagnosis present

## 2021-03-12 DIAGNOSIS — A4189 Other specified sepsis: Principal | ICD-10-CM | POA: Diagnosis present

## 2021-03-12 DIAGNOSIS — I472 Ventricular tachycardia, unspecified: Secondary | ICD-10-CM | POA: Diagnosis not present

## 2021-03-12 DIAGNOSIS — T300 Burn of unspecified body region, unspecified degree: Secondary | ICD-10-CM | POA: Diagnosis not present

## 2021-03-12 DIAGNOSIS — F0390 Unspecified dementia without behavioral disturbance: Secondary | ICD-10-CM | POA: Diagnosis not present

## 2021-03-12 DIAGNOSIS — Z79899 Other long term (current) drug therapy: Secondary | ICD-10-CM

## 2021-03-12 DIAGNOSIS — N183 Chronic kidney disease, stage 3 unspecified: Secondary | ICD-10-CM | POA: Diagnosis not present

## 2021-03-12 DIAGNOSIS — R4182 Altered mental status, unspecified: Secondary | ICD-10-CM | POA: Diagnosis not present

## 2021-03-12 DIAGNOSIS — Z8673 Personal history of transient ischemic attack (TIA), and cerebral infarction without residual deficits: Secondary | ICD-10-CM

## 2021-03-12 DIAGNOSIS — E785 Hyperlipidemia, unspecified: Secondary | ICD-10-CM | POA: Diagnosis present

## 2021-03-12 DIAGNOSIS — R001 Bradycardia, unspecified: Secondary | ICD-10-CM | POA: Diagnosis not present

## 2021-03-12 DIAGNOSIS — E8809 Other disorders of plasma-protein metabolism, not elsewhere classified: Secondary | ICD-10-CM | POA: Diagnosis not present

## 2021-03-12 DIAGNOSIS — T68XXXA Hypothermia, initial encounter: Secondary | ICD-10-CM | POA: Diagnosis not present

## 2021-03-12 DIAGNOSIS — L89322 Pressure ulcer of left buttock, stage 2: Secondary | ICD-10-CM | POA: Diagnosis present

## 2021-03-12 DIAGNOSIS — Z20822 Contact with and (suspected) exposure to covid-19: Secondary | ICD-10-CM | POA: Diagnosis present

## 2021-03-12 DIAGNOSIS — N1832 Chronic kidney disease, stage 3b: Secondary | ICD-10-CM | POA: Diagnosis present

## 2021-03-12 DIAGNOSIS — E1122 Type 2 diabetes mellitus with diabetic chronic kidney disease: Secondary | ICD-10-CM | POA: Diagnosis not present

## 2021-03-12 DIAGNOSIS — N390 Urinary tract infection, site not specified: Secondary | ICD-10-CM | POA: Diagnosis not present

## 2021-03-12 DIAGNOSIS — Z993 Dependence on wheelchair: Secondary | ICD-10-CM | POA: Diagnosis not present

## 2021-03-12 DIAGNOSIS — Z87891 Personal history of nicotine dependence: Secondary | ICD-10-CM

## 2021-03-12 DIAGNOSIS — E1165 Type 2 diabetes mellitus with hyperglycemia: Secondary | ICD-10-CM | POA: Diagnosis present

## 2021-03-12 DIAGNOSIS — N400 Enlarged prostate without lower urinary tract symptoms: Secondary | ICD-10-CM | POA: Diagnosis present

## 2021-03-12 DIAGNOSIS — E46 Unspecified protein-calorie malnutrition: Secondary | ICD-10-CM | POA: Diagnosis not present

## 2021-03-12 DIAGNOSIS — I129 Hypertensive chronic kidney disease with stage 1 through stage 4 chronic kidney disease, or unspecified chronic kidney disease: Secondary | ICD-10-CM | POA: Diagnosis present

## 2021-03-12 DIAGNOSIS — L89312 Pressure ulcer of right buttock, stage 2: Secondary | ICD-10-CM | POA: Diagnosis not present

## 2021-03-12 DIAGNOSIS — I441 Atrioventricular block, second degree: Secondary | ICD-10-CM | POA: Diagnosis not present

## 2021-03-12 DIAGNOSIS — R5381 Other malaise: Secondary | ICD-10-CM

## 2021-03-12 DIAGNOSIS — A419 Sepsis, unspecified organism: Secondary | ICD-10-CM | POA: Diagnosis present

## 2021-03-12 DIAGNOSIS — R410 Disorientation, unspecified: Secondary | ICD-10-CM | POA: Diagnosis not present

## 2021-03-12 DIAGNOSIS — Z791 Long term (current) use of non-steroidal anti-inflammatories (NSAID): Secondary | ICD-10-CM

## 2021-03-12 DIAGNOSIS — I10 Essential (primary) hypertension: Secondary | ICD-10-CM | POA: Diagnosis present

## 2021-03-12 DIAGNOSIS — B961 Klebsiella pneumoniae [K. pneumoniae] as the cause of diseases classified elsewhere: Secondary | ICD-10-CM | POA: Diagnosis present

## 2021-03-12 DIAGNOSIS — Z7982 Long term (current) use of aspirin: Secondary | ICD-10-CM

## 2021-03-12 DIAGNOSIS — Z794 Long term (current) use of insulin: Secondary | ICD-10-CM

## 2021-03-12 LAB — URINALYSIS, ROUTINE W REFLEX MICROSCOPIC
Bilirubin Urine: NEGATIVE
Glucose, UA: NEGATIVE mg/dL
Ketones, ur: NEGATIVE mg/dL
Nitrite: NEGATIVE
Protein, ur: 100 mg/dL — AB
Specific Gravity, Urine: 1.012 (ref 1.005–1.030)
WBC, UA: >50 WBC/hpf — ABNORMAL HIGH (ref 0–5)
pH: 6 (ref 5.0–8.0)

## 2021-03-12 LAB — COMPREHENSIVE METABOLIC PANEL
ALT: 11 U/L (ref 0–44)
AST: 17 U/L (ref 15–41)
Albumin: 2.5 g/dL — ABNORMAL LOW (ref 3.5–5.0)
Alkaline Phosphatase: 114 U/L (ref 38–126)
Anion gap: 11 (ref 5–15)
BUN: 26 mg/dL — ABNORMAL HIGH (ref 8–23)
CO2: 23 mmol/L (ref 22–32)
Calcium: 8.7 mg/dL — ABNORMAL LOW (ref 8.9–10.3)
Chloride: 103 mmol/L (ref 98–111)
Creatinine, Ser: 1.35 mg/dL — ABNORMAL HIGH (ref 0.61–1.24)
GFR, Estimated: 50 mL/min — ABNORMAL LOW (ref >60)
Glucose, Bld: 174 mg/dL — ABNORMAL HIGH (ref 70–99)
Potassium: 4.4 mmol/L (ref 3.5–5.1)
Sodium: 137 mmol/L (ref 135–145)
Total Bilirubin: 1 mg/dL (ref 0.3–1.2)
Total Protein: 7.3 g/dL (ref 6.5–8.1)

## 2021-03-12 LAB — CBC WITH DIFFERENTIAL/PLATELET
Abs Immature Granulocytes: 0.07 10*3/uL (ref 0.00–0.07)
Basophils Absolute: 0.1 10*3/uL (ref 0.0–0.1)
Basophils Relative: 1 %
Eosinophils Absolute: 0.3 10*3/uL (ref 0.0–0.5)
Eosinophils Relative: 3 %
HCT: 38.2 % — ABNORMAL LOW (ref 39.0–52.0)
Hemoglobin: 11.8 g/dL — ABNORMAL LOW (ref 13.0–17.0)
Immature Granulocytes: 1 %
Lymphocytes Relative: 10 %
Lymphs Abs: 1 10*3/uL (ref 0.7–4.0)
MCH: 29.1 pg (ref 26.0–34.0)
MCHC: 30.9 g/dL (ref 30.0–36.0)
MCV: 94.3 fL (ref 80.0–100.0)
Monocytes Absolute: 0.5 10*3/uL (ref 0.1–1.0)
Monocytes Relative: 5 %
Neutro Abs: 8.1 10*3/uL — ABNORMAL HIGH (ref 1.7–7.7)
Neutrophils Relative %: 80 %
Platelets: 312 10*3/uL (ref 150–400)
RBC: 4.05 MIL/uL — ABNORMAL LOW (ref 4.22–5.81)
RDW: 13.7 % (ref 11.5–15.5)
WBC: 10.1 10*3/uL (ref 4.0–10.5)
nRBC: 0 % (ref 0.0–0.2)

## 2021-03-12 LAB — RAPID URINE DRUG SCREEN, HOSP PERFORMED
Amphetamines: NOT DETECTED
Barbiturates: NOT DETECTED
Benzodiazepines: NOT DETECTED
Cocaine: NOT DETECTED
Opiates: NOT DETECTED
Tetrahydrocannabinol: NOT DETECTED

## 2021-03-12 LAB — ETHANOL: Alcohol, Ethyl (B): <10 mg/dL (ref <10)

## 2021-03-12 LAB — LACTIC ACID, PLASMA: Lactic Acid, Venous: 1.8 mmol/L (ref 0.5–1.9)

## 2021-03-12 MED ORDER — SODIUM CHLORIDE 0.9 % IV SOLN: INTRAVENOUS | Status: DC

## 2021-03-12 NOTE — ED Triage Notes (Signed)
Pt from home via [facility]. C/o AMS; possible UTI. No fever  Family stated was acting confused around 5pm. Family states that when pt acts confused, pt typically has a UTI  Per EMS CGB: 180 HR: 100/110 O2 95% BP:140/80

## 2021-03-12 NOTE — ED Provider Notes (Signed)
°  Provider Note MRN:  [id number]  Arrival date & time: 03/13/21    ED Course and Medical Decision Making  Assumed care from Dr. Sabra Heck at shift change.  88 years old, lives alone, altered mental status per family.  Hallucinations.  Suspect need for admission.  Urinalysis suspicious for infection, providing IV ceftriaxone and will admit to medicine.  Procedures  Final Clinical Impressions(s) / ED Diagnoses     ICD-10-CM   1. Altered mental status, unspecified altered mental status type  R41.82     2. Lower urinary tract infectious disease  N39.0       ED Discharge Orders     None       Discharge Instructions   None     Barth Kirks. Sedonia Small, Philip [email or web address]    Maudie Flakes, MD 03/13/21 [phone]

## 2021-03-12 NOTE — ED Notes (Signed)
Patient transported to CT 

## 2021-03-12 NOTE — ED Provider Notes (Signed)
[facility] EMERGENCY DEPARTMENT Provider Note   CSN: [id number] Arrival date & time: 03/12/21  2227     History  Chief Complaint  Patient presents with   Altered Mental Status    Mike Briggs is a 88 y.o. male.   Altered Mental Status  This patient is a 88 year old male, known history of hypertension as well as type 2 diabetes and some cognitive decline.  He currently lives by himself, evidently the patient has been talking about seeing people that are not there throughout the day, he has had some increasing altered mental status this evening and some generalized weakness.  When family came over to check on him they found him to be very confused, they felt like his urine smelled bad and he does have a history of urinary tract infection causing altered mental status according to the family members that reported to the paramedics.  Paramedics reported that the patient had a blood sugar that was around 180, no other significant abnormal vital signs.  The patient denies any specific complaints, he is wondering why a white man and his wife were sitting on his bed this morning, this is part of the altered mental status.  Evidently there has been no vomiting coughing shortness of breath fevers diarrhea rashes or swelling in the patient has no pain complaints at this time  Home Medications Prior to Admission medications   Medication Sig Start Date End Date Taking? Authorizing Provider  amLODipine (NORVASC) 10 MG tablet TAKE ONE TABLET BY MOUTH ONCE DAILY 01/25/21   [name], MD  aspirin 81 MG chewable tablet Chew 1 tablet (81 mg total) by mouth daily with breakfast. 12/04/19   Perlie Mayo, NP  atorvastatin (LIPITOR) 10 MG tablet TAKE 1 TABLET BY MOUTH AFTER SUPPER. 02/22/21   [name], MD  blood glucose meter kit and supplies Dispense based on patient and insurance preference. Use up to four times daily as directed. (FOR ICD-10 E10.9, E11.9). 04/05/20   [name], MD   gabapentin (NEURONTIN) 300 MG capsule Take 1 capsule (300 mg total) by mouth at bedtime. 03/08/21   [name], MD  LANTUS SOLOSTAR 100 UNIT/ML Solostar Pen Inject 20 Units into the skin daily. 10/10/20   [name], MD  lisinopril (ZESTRIL) 40 MG tablet TAKE (1) TABLET BY MOUTH ONCE DAILY. 02/22/21   [name], MD  meloxicam (MOBIC) 7.5 MG tablet Take 1 tablet (7.5 mg total) by mouth daily. 03/08/21 06/06/21  [name], MD  memantine (NAMENDA) 10 MG tablet TAKE (1) TABLET BY MOUTH TWICE DAILY. 02/22/21   [name], MD  mupirocin cream (BACTROBAN) 2 % Apply 1 application topically 2 (two) times daily. 04/05/20   [name], MD  pantoprazole (PROTONIX) 40 MG tablet Take 1 tablet (40 mg total) by mouth daily. 01/28/21   Orson Eva, MD  SURE COMFORT PEN NEEDLES 31G X 5 MM MISC USE TO INJECT AS DIRECTED FOUR TIMES A DAY. WITH MEALS AND BEFORE BEDTIME. 08/17/20   [name], MD  tamsulosin (FLOMAX) 0.4 MG CAPS capsule TAKE 1 CAPSULE BY MOUTH DAILY AFTER BREAKFAST. 02/23/21   Irine Seal, MD      Allergies    Patient has no known allergies.    Review of Systems   Review of Systems  All other systems reviewed and are negative.  Physical Exam Updated Vital Signs BP 140/83 (BP Location: Left Arm)    Pulse (!) 104  Temp 97.7 F (36.5 C) (Oral)    Resp 15    Ht 1.803 m (5' 11")    Wt 95 kg    SpO2 97%    BMI 29.21 kg/m  Physical Exam Vitals and nursing note reviewed.  Constitutional:      General: He is not in acute distress.    Appearance: He is well-developed.  HENT:     Head: Normocephalic and atraumatic.     Nose: No congestion or rhinorrhea.     Mouth/Throat:     Mouth: Mucous membranes are moist.     Pharynx: No oropharyngeal exudate.  Eyes:     General: No scleral icterus.       Right eye: No discharge.        Left eye: No discharge.     Conjunctiva/sclera: Conjunctivae normal.     Pupils: Pupils are equal, round, and reactive to light.  Neck:      Thyroid: No thyromegaly.     Vascular: No JVD.  Cardiovascular:     Rate and Rhythm: Normal rate and regular rhythm.     Heart sounds: Normal heart sounds. No murmur heard.   No friction rub. No gallop.  Pulmonary:     Effort: Pulmonary effort is normal. No respiratory distress.     Breath sounds: Normal breath sounds. No wheezing or rales.  Abdominal:     General: Bowel sounds are normal. There is no distension.     Palpations: Abdomen is soft. There is no mass.     Tenderness: There is no abdominal tenderness.  Musculoskeletal:        General: No tenderness. Normal range of motion.     Cervical back: Normal range of motion and neck supple.     Right lower leg: No edema.     Left lower leg: No edema.  Lymphadenopathy:     Cervical: No cervical adenopathy.  Skin:    General: Skin is warm and dry.     Findings: No erythema or rash.  Neurological:     Mental Status: He is alert.     Coordination: Coordination normal.     Comments: The patient is mildly somnolent, able to engage in short conversation, continues to follow commands but with generalized weakness.  He is able to sit up in the bed with some assistance, able to lift both legs though only several inches off the bed, his grips are weak bilaterally, his level of alertness is slightly decreased.  He does appear to be having hallucinations that he had seen somebody earlier in the day but is not actively hallucinating.  He does not express any feelings of depression or anxiety.  He has normal reactive pupils and his cranial nerves III through XII are normal  Psychiatric:     Comments: Hallucinations present    ED Results / Procedures / Treatments   Labs (all labs ordered are listed, but only abnormal results are displayed) Labs Reviewed  RESP PANEL BY RT-PCR (FLU A&B, COVID) [id number]  COMPREHENSIVE METABOLIC PANEL  CBC WITH DIFFERENTIAL/PLATELET  URINALYSIS, ROUTINE W REFLEX MICROSCOPIC  AMMONIA  LACTIC ACID, PLASMA  BLOOD  GAS, VENOUS  RAPID URINE DRUG SCREEN, HOSP PERFORMED  ETHANOL  CBG MONITORING, ED    EKG None  Radiology No results found.  Procedures Procedures    Medications Ordered in ED Medications  0.9 %  sodium chloride infusion (has no administration in time range)    ED Course/ Medical Decision Making/ A&P  Medical Decision Making Amount and/or Complexity of Data Reviewed Labs: ordered. Radiology: ordered.  Risk Prescription drug management.   This patient presents to the ED for concern of Altered Mental Status, this involves an extensive number of treatment options, and is a complaint that carries with it a high risk of complications and morbidity.  The differential diagnosis includes The cause of the patient's altered mental status is not exactly clear, this may be related to an underlying infection, anemia, stroke, heart issues, rhythm issues, glucose issues etc.  His prehospital heart rate was around 100, his oxygen level was 95% and his blood pressure was 140/80.  We will follow-up with labs including urinalysis, urine culture, blood counts ammonia drug screen alcohol level as well as a chest x-ray and head CT.    At the change of shift care will be signed out to Dr. Sedonia Small to follow-up results and disposition accordingly   Co morbidities that complicate the patient evaluation  Diabetes, Cognitive decline, Hypertension, elderly   Additional history obtained:  Additional history obtained from EMR and EMS External records from outside source obtained and reviewed including [facility] prior visits over the last couple of months for various and sundry issues including an admission to the hospital for chest pain where he had serial troponins and it was noted that he had some chronic intermittent dysphagia and esophageal dysmotility, this was to be observed and not treated acutely   Cardiac Monitoring:  The patient was maintained on a cardiac monitor.  I  personally viewed and interpreted the cardiac monitored which showed an underlying rhythm of: borderline sinus tachycardia   Problem List / ED Course:  Altered - lives by self - w/u pending.   Social Determinants of Health:  Elderly lives by self / altered / cognitive decline   Dispostion:  After consideration of the diagnostic results and the patients response to treatment, after work-up the patient will need to have evaluation for either discharge or admission by oncoming emergency department physician Dr. Sedonia Small.    Final Clinical Impression(s) / ED Diagnoses Final diagnoses:  Altered mental status, unspecified altered mental status type    Rx / DC Orders ED Discharge Orders     None         Noemi Chapel, MD 03/12/21 2246

## 2021-03-13 DIAGNOSIS — E8809 Other disorders of plasma-protein metabolism, not elsewhere classified: Secondary | ICD-10-CM | POA: Diagnosis present

## 2021-03-13 DIAGNOSIS — I129 Hypertensive chronic kidney disease with stage 1 through stage 4 chronic kidney disease, or unspecified chronic kidney disease: Secondary | ICD-10-CM | POA: Diagnosis present

## 2021-03-13 DIAGNOSIS — A419 Sepsis, unspecified organism: Secondary | ICD-10-CM

## 2021-03-13 DIAGNOSIS — F0390 Unspecified dementia without behavioral disturbance: Secondary | ICD-10-CM

## 2021-03-13 DIAGNOSIS — L89312 Pressure ulcer of right buttock, stage 2: Secondary | ICD-10-CM | POA: Diagnosis present

## 2021-03-13 DIAGNOSIS — Z20822 Contact with and (suspected) exposure to covid-19: Secondary | ICD-10-CM | POA: Diagnosis present

## 2021-03-13 DIAGNOSIS — I10 Essential (primary) hypertension: Secondary | ICD-10-CM | POA: Diagnosis not present

## 2021-03-13 DIAGNOSIS — G9341 Metabolic encephalopathy: Secondary | ICD-10-CM

## 2021-03-13 DIAGNOSIS — R001 Bradycardia, unspecified: Secondary | ICD-10-CM

## 2021-03-13 DIAGNOSIS — R4182 Altered mental status, unspecified: Secondary | ICD-10-CM

## 2021-03-13 DIAGNOSIS — E46 Unspecified protein-calorie malnutrition: Secondary | ICD-10-CM

## 2021-03-13 DIAGNOSIS — R410 Disorientation, unspecified: Secondary | ICD-10-CM

## 2021-03-13 DIAGNOSIS — E1165 Type 2 diabetes mellitus with hyperglycemia: Secondary | ICD-10-CM

## 2021-03-13 DIAGNOSIS — N400 Enlarged prostate without lower urinary tract symptoms: Secondary | ICD-10-CM | POA: Diagnosis not present

## 2021-03-13 DIAGNOSIS — Z8673 Personal history of transient ischemic attack (TIA), and cerebral infarction without residual deficits: Secondary | ICD-10-CM | POA: Diagnosis not present

## 2021-03-13 DIAGNOSIS — A4189 Other specified sepsis: Secondary | ICD-10-CM | POA: Diagnosis present

## 2021-03-13 DIAGNOSIS — Z87891 Personal history of nicotine dependence: Secondary | ICD-10-CM | POA: Diagnosis not present

## 2021-03-13 DIAGNOSIS — E1122 Type 2 diabetes mellitus with diabetic chronic kidney disease: Secondary | ICD-10-CM | POA: Diagnosis present

## 2021-03-13 DIAGNOSIS — I472 Ventricular tachycardia, unspecified: Secondary | ICD-10-CM | POA: Diagnosis present

## 2021-03-13 DIAGNOSIS — N390 Urinary tract infection, site not specified: Secondary | ICD-10-CM | POA: Diagnosis not present

## 2021-03-13 DIAGNOSIS — F32A Depression, unspecified: Secondary | ICD-10-CM | POA: Diagnosis present

## 2021-03-13 DIAGNOSIS — E785 Hyperlipidemia, unspecified: Secondary | ICD-10-CM | POA: Diagnosis present

## 2021-03-13 DIAGNOSIS — N183 Chronic kidney disease, stage 3 unspecified: Secondary | ICD-10-CM

## 2021-03-13 DIAGNOSIS — K219 Gastro-esophageal reflux disease without esophagitis: Secondary | ICD-10-CM | POA: Diagnosis present

## 2021-03-13 DIAGNOSIS — I441 Atrioventricular block, second degree: Secondary | ICD-10-CM | POA: Diagnosis present

## 2021-03-13 DIAGNOSIS — L89322 Pressure ulcer of left buttock, stage 2: Secondary | ICD-10-CM | POA: Diagnosis present

## 2021-03-13 DIAGNOSIS — Z8616 Personal history of COVID-19: Secondary | ICD-10-CM | POA: Diagnosis not present

## 2021-03-13 DIAGNOSIS — N1832 Chronic kidney disease, stage 3b: Secondary | ICD-10-CM | POA: Diagnosis present

## 2021-03-13 DIAGNOSIS — Z993 Dependence on wheelchair: Secondary | ICD-10-CM | POA: Diagnosis not present

## 2021-03-13 DIAGNOSIS — N1831 Chronic kidney disease, stage 3a: Secondary | ICD-10-CM

## 2021-03-13 DIAGNOSIS — T68XXXA Hypothermia, initial encounter: Secondary | ICD-10-CM | POA: Diagnosis present

## 2021-03-13 HISTORY — DX: Sepsis, unspecified organism: A41.9

## 2021-03-13 HISTORY — DX: Urinary tract infection, site not specified: N39.0

## 2021-03-13 HISTORY — DX: Metabolic encephalopathy: G93.41

## 2021-03-13 LAB — BLOOD GAS, VENOUS
Acid-Base Excess: 0.1 mmol/L (ref 0.0–2.0)
Acid-base deficit: 0 mmol/L (ref 0.0–2.0)
Bicarbonate: 23.5 mmol/L (ref 20.0–28.0)
Bicarbonate: 23.8 mmol/L (ref 20.0–28.0)
Drawn by: 1517
Drawn by: 6509
FIO2: 21
FIO2: 21
O2 Saturation: 56.8 %
O2 Saturation: 66.6 %
Patient temperature: 36.4
Patient temperature: 36.5
pCO2, Ven: 41.4 mmHg — ABNORMAL LOW (ref 44.0–60.0)
pCO2, Ven: 44.5 mmHg (ref 44.0–60.0)
pH, Ven: 7.362 (ref 7.250–7.430)
pH, Ven: 7.389 (ref 7.250–7.430)
pO2, Ven: 34.2 mmHg (ref 32.0–45.0)
pO2, Ven: 38.7 mmHg (ref 32.0–45.0)

## 2021-03-13 LAB — RESP PANEL BY RT-PCR (FLU A&B, COVID) ARPGX2
Influenza A by PCR: NEGATIVE
Influenza B by PCR: NEGATIVE
SARS Coronavirus 2 by RT PCR: NEGATIVE

## 2021-03-13 LAB — CBC
HCT: 35.7 % — ABNORMAL LOW (ref 39.0–52.0)
Hemoglobin: 10.7 g/dL — ABNORMAL LOW (ref 13.0–17.0)
MCH: 28.3 pg (ref 26.0–34.0)
MCHC: 30 g/dL (ref 30.0–36.0)
MCV: 94.4 fL (ref 80.0–100.0)
Platelets: 296 10*3/uL (ref 150–400)
RBC: 3.78 MIL/uL — ABNORMAL LOW (ref 4.22–5.81)
RDW: 13.9 % (ref 11.5–15.5)
WBC: 11.6 10*3/uL — ABNORMAL HIGH (ref 4.0–10.5)
nRBC: 0 % (ref 0.0–0.2)

## 2021-03-13 LAB — GLUCOSE, CAPILLARY
Glucose-Capillary: 74 mg/dL (ref 70–99)
Glucose-Capillary: 68 mg/dL — ABNORMAL LOW (ref 70–99)
Glucose-Capillary: 75 mg/dL (ref 70–99)

## 2021-03-13 LAB — PROCALCITONIN: Procalcitonin: <0.1 ng/mL

## 2021-03-13 LAB — COMPREHENSIVE METABOLIC PANEL
ALT: 12 U/L (ref 0–44)
AST: 15 U/L (ref 15–41)
Albumin: 2.6 g/dL — ABNORMAL LOW (ref 3.5–5.0)
Alkaline Phosphatase: 115 U/L (ref 38–126)
Anion gap: 12 (ref 5–15)
BUN: 26 mg/dL — ABNORMAL HIGH (ref 8–23)
CO2: 23 mmol/L (ref 22–32)
Calcium: 8.6 mg/dL — ABNORMAL LOW (ref 8.9–10.3)
Chloride: 104 mmol/L (ref 98–111)
Creatinine, Ser: 1.27 mg/dL — ABNORMAL HIGH (ref 0.61–1.24)
GFR, Estimated: 54 mL/min — ABNORMAL LOW (ref >60)
Glucose, Bld: 105 mg/dL — ABNORMAL HIGH (ref 70–99)
Potassium: 3.8 mmol/L (ref 3.5–5.1)
Sodium: 139 mmol/L (ref 135–145)
Total Bilirubin: 0.6 mg/dL (ref 0.3–1.2)
Total Protein: 7.3 g/dL (ref 6.5–8.1)

## 2021-03-13 LAB — AMMONIA: Ammonia: <10 umol/L (ref 9–35)

## 2021-03-13 LAB — CORTISOL-AM, BLOOD: Cortisol - AM: 21.5 ug/dL (ref 6.7–22.6)

## 2021-03-13 LAB — HEMOGLOBIN A1C
Hgb A1c MFr Bld: 5.8 % — ABNORMAL HIGH (ref 4.8–5.6)
Mean Plasma Glucose: 119.76 mg/dL

## 2021-03-13 LAB — TSH: TSH: 0.921 u[IU]/mL (ref 0.350–4.500)

## 2021-03-13 LAB — LACTIC ACID, PLASMA
Lactic Acid, Venous: 1.1 mmol/L (ref 0.5–1.9)
Lactic Acid, Venous: 1.3 mmol/L (ref 0.5–1.9)

## 2021-03-13 LAB — CBG MONITORING, ED
Glucose-Capillary: 131 mg/dL — ABNORMAL HIGH (ref 70–99)
Glucose-Capillary: 158 mg/dL — ABNORMAL HIGH (ref 70–99)
Glucose-Capillary: 70 mg/dL (ref 70–99)

## 2021-03-13 LAB — APTT: aPTT: 52 seconds — ABNORMAL HIGH (ref 24–36)

## 2021-03-13 LAB — MAGNESIUM: Magnesium: 1.6 mg/dL — ABNORMAL LOW (ref 1.7–2.4)

## 2021-03-13 LAB — VITAMIN B12: Vitamin B-12: 391 pg/mL (ref 180–914)

## 2021-03-13 LAB — FOLATE: Folate: 9.1 ng/mL (ref >5.9)

## 2021-03-13 LAB — T4, FREE: Free T4: 1.23 ng/dL — ABNORMAL HIGH (ref 0.61–1.12)

## 2021-03-13 LAB — PHOSPHORUS: Phosphorus: 3.5 mg/dL (ref 2.5–4.6)

## 2021-03-13 LAB — MRSA NEXT GEN BY PCR, NASAL: MRSA by PCR Next Gen: DETECTED — AB

## 2021-03-13 MED ORDER — ACETAMINOPHEN 650 MG RE SUPP: 650.0000 mg | Freq: Four times a day (QID) | RECTAL | Status: DC | PRN

## 2021-03-13 MED ORDER — ENOXAPARIN SODIUM 40 MG/0.4ML IJ SOSY
40.0000 mg | PREFILLED_SYRINGE | Freq: Every day | INTRAMUSCULAR | Status: DC
  Administered 2021-03-13 – 2021-03-16 (×4): 40 mg via SUBCUTANEOUS
  Filled 2021-03-13 (×4): qty 0.4

## 2021-03-13 MED ORDER — MUPIROCIN 2 % EX OINT
TOPICAL_OINTMENT | Freq: Two times a day (BID) | CUTANEOUS | Status: DC
  Administered 2021-03-13: 1 via NASAL
  Filled 2021-03-13 (×2): qty 22

## 2021-03-13 MED ORDER — PANTOPRAZOLE SODIUM 40 MG PO TBEC
40.0000 mg | DELAYED_RELEASE_TABLET | Freq: Every day | ORAL | Status: DC
  Administered 2021-03-13: 40 mg via ORAL
  Filled 2021-03-13: qty 1

## 2021-03-13 MED ORDER — ACETAMINOPHEN 325 MG PO TABS
650.0000 mg | ORAL_TABLET | Freq: Four times a day (QID) | ORAL | Status: DC | PRN
  Administered 2021-03-13: 04:00:00 650 mg via ORAL
  Filled 2021-03-13: qty 2

## 2021-03-13 MED ORDER — MAGNESIUM SULFATE 2 GM/50ML IV SOLN
2.0000 g | Freq: Once | INTRAVENOUS | Status: AC
  Administered 2021-03-13: 2 g via INTRAVENOUS
  Filled 2021-03-13: qty 50

## 2021-03-13 MED ORDER — TAMSULOSIN HCL 0.4 MG PO CAPS
0.4000 mg | ORAL_CAPSULE | Freq: Every day | ORAL | Status: DC
  Administered 2021-03-13: 0.4 mg via ORAL
  Filled 2021-03-13: qty 1

## 2021-03-13 MED ORDER — INSULIN GLARGINE-YFGN 100 UNIT/ML ~~LOC~~ SOLN
10.0000 [IU] | Freq: Every day | SUBCUTANEOUS | Status: DC
  Filled 2021-03-13: qty 0.1

## 2021-03-13 MED ORDER — ATORVASTATIN CALCIUM 10 MG PO TABS
10.0000 mg | ORAL_TABLET | Freq: Every day | ORAL | Status: DC
  Administered 2021-03-14 – 2021-03-16 (×3): 10 mg via ORAL
  Filled 2021-03-13 (×3): qty 1

## 2021-03-13 MED ORDER — SODIUM CHLORIDE 0.9 % IV SOLN
2.0000 g | INTRAVENOUS | Status: DC
  Administered 2021-03-14 – 2021-03-15 (×2): 2 g via INTRAVENOUS
  Filled 2021-03-13 (×2): qty 20

## 2021-03-13 MED ORDER — SODIUM CHLORIDE 0.9 % IV SOLN: 1.0000 g | INTRAVENOUS | Status: DC

## 2021-03-13 MED ORDER — GLUCERNA SHAKE PO LIQD
237.0000 mL | Freq: Three times a day (TID) | ORAL | Status: DC
  Administered 2021-03-13 – 2021-03-16 (×7): 237 mL via ORAL
  Filled 2021-03-13 (×8): qty 237

## 2021-03-13 MED ORDER — BISACODYL 5 MG PO TBEC
5.0000 mg | DELAYED_RELEASE_TABLET | Freq: Every day | ORAL | Status: DC | PRN
  Administered 2021-03-13: 5 mg via ORAL
  Filled 2021-03-13: qty 1

## 2021-03-13 MED ORDER — TAMSULOSIN HCL 0.4 MG PO CAPS
0.4000 mg | ORAL_CAPSULE | Freq: Every day | ORAL | Status: DC
  Administered 2021-03-14 – 2021-03-16 (×3): 0.4 mg via ORAL
  Filled 2021-03-13 (×3): qty 1

## 2021-03-13 MED ORDER — INSULIN ASPART 100 UNIT/ML IJ SOLN: 3.0000 [IU] | Freq: Three times a day (TID) | INTRAMUSCULAR | Status: DC

## 2021-03-13 MED ORDER — MEMANTINE HCL 10 MG PO TABS
10.0000 mg | ORAL_TABLET | Freq: Every day | ORAL | Status: DC
  Administered 2021-03-14 – 2021-03-16 (×3): 10 mg via ORAL
  Filled 2021-03-13 (×3): qty 1

## 2021-03-13 MED ORDER — CHLORHEXIDINE GLUCONATE CLOTH 2 % EX PADS
6.0000 | MEDICATED_PAD | Freq: Every day | CUTANEOUS | Status: DC
  Administered 2021-03-13 – 2021-03-16 (×4): 6 via TOPICAL

## 2021-03-13 MED ORDER — MEMANTINE HCL 10 MG PO TABS
10.0000 mg | ORAL_TABLET | Freq: Every day | ORAL | Status: DC
  Administered 2021-03-13: 10 mg via ORAL
  Filled 2021-03-13: qty 1

## 2021-03-13 MED ORDER — AMLODIPINE BESYLATE 5 MG PO TABS: 10.0000 mg | ORAL_TABLET | Freq: Every day | ORAL | Status: DC

## 2021-03-13 MED ORDER — INSULIN ASPART 100 UNIT/ML IJ SOLN: 0.0000 [IU] | Freq: Every day | INTRAMUSCULAR | Status: DC

## 2021-03-13 MED ORDER — LISINOPRIL 10 MG PO TABS: 40.0000 mg | ORAL_TABLET | Freq: Every day | ORAL | Status: DC

## 2021-03-13 MED ORDER — SODIUM CHLORIDE 0.9 % IV SOLN
2.0000 g | Freq: Once | INTRAVENOUS | Status: AC
  Administered 2021-03-13: 2 g via INTRAVENOUS
  Filled 2021-03-13: qty 20

## 2021-03-13 MED ORDER — INSULIN ASPART 100 UNIT/ML IJ SOLN
0.0000 [IU] | Freq: Three times a day (TID) | INTRAMUSCULAR | Status: DC
  Administered 2021-03-14 – 2021-03-15 (×2): 1 [IU] via SUBCUTANEOUS
  Administered 2021-03-15: 2 [IU] via SUBCUTANEOUS
  Administered 2021-03-15 – 2021-03-16 (×2): 1 [IU] via SUBCUTANEOUS

## 2021-03-13 MED ORDER — PANTOPRAZOLE SODIUM 40 MG PO TBEC
40.0000 mg | DELAYED_RELEASE_TABLET | Freq: Every day | ORAL | Status: DC
  Administered 2021-03-14 – 2021-03-16 (×3): 40 mg via ORAL
  Filled 2021-03-13 (×3): qty 1

## 2021-03-13 MED ORDER — ATORVASTATIN CALCIUM 10 MG PO TABS
10.0000 mg | ORAL_TABLET | Freq: Every day | ORAL | Status: DC
  Administered 2021-03-13: 10 mg via ORAL
  Filled 2021-03-13: qty 1

## 2021-03-13 NOTE — Assessment & Plan Note (Signed)
Replete Recheck magnesium after repletion

## 2021-03-13 NOTE — ED Notes (Signed)
Hospitalist notified of pt rectal temp of 94. Rechecked 32min after placing warm blankets. Temp was 94.7. hospitalist stated to place pt on [facility] and he wants blood cultures. Primary RN notified as well.

## 2021-03-13 NOTE — Assessment & Plan Note (Signed)
-  Continue Lipitor °

## 2021-03-13 NOTE — Progress Notes (Signed)
Patient continues to run sinus brady with heart rate in the 30's (as low as 35) and 40's. Dr Tat aware. Bladder scan performed due to patient has not voided with result of 384ml. Dr Tat made aware and per verbal order continue to monitor at this time. Dr Tat made aware of rectal temp 35.4. Barre hugger placed back on. Patient continues to be somnolent and wakes when name called but only briefly then goes back to sleep. Patient only ate a couple bites at lunch time, diet changed due to patient has no teeth and Dr Tat made aware.

## 2021-03-13 NOTE — ED Notes (Signed)
Lab at bedside

## 2021-03-13 NOTE — Plan of Care (Signed)

## 2021-03-13 NOTE — ED Notes (Signed)
Hospitalist at bedside 

## 2021-03-13 NOTE — Progress Notes (Addendum)
RN called due to patient's rectal temperature being at 53F, warm blanket was placed and temperature increased to 94.43F.  Patient will be placed on [facility] and admitted to stepdown unit.  Blood cultures will be obtained.  Procalcitonin and lactic acid level will be checked.  Continue antibiotics as indicated in H&P. Patient was also noted to have hypomagnesemia, this was replenished. Patient was also noted to be bradycardic with HR ranging within 43-56 within last 3 hours, EKG was obtained and showed sinus bradycardia at a rate of 44 bpm with prolonged PR interval.  At bedside, patient was somnolent, but easily arousable to touch, though quickly goes back to sleep.  Continue telemetry and  cardiology will be consulted.  HR was normal on admission.  Assessment and plan: UTI POA with possible urosepsis Patient has developed hypothermia since admission and WBC has increased to 11.6 at this time. Patient was placed on Bair hugger, blood culture was obtained, lactic acid and procalcitonin pending No indication for IV hydration per sepsis protocol at this time due to maintenance of BP with MAP greater than or equal to 65.  Continue antibiotics as indicated in H&P  Hypomagnesemia Mg level is 1.6 This will be replenished Please continue to monitor Mg level and correct accordingly  Sinus bradycardia HR ranged within 43-56 Continue telemetry EKG obtained showed sinus bradycardia at a rate of 44 bpm with prolonged PR interval Echocardiogram done on 01/28/2019 showed LVEF of 60 to 65%. No RWMA Cardiology will be consulted and we shall await further recommendation  Total time:  15 minutes This includes time reviewing the chart including progress notes, labs, EKGs, taking medical decisions, ordering labs and documenting findings.

## 2021-03-13 NOTE — Assessment & Plan Note (Addendum)
Secondary to UTI UA>50 WBC VBG 7.3 89/41/38/24 on room air B12--391 TSH--0.921 Ammonia<10 03/14/21--overall improving, near baseline

## 2021-03-13 NOTE — Assessment & Plan Note (Addendum)
Pt endorsed dysuria Preliminary culture = Klebsiella UA>50 WBC He was treated with ceftriaxone Transition to cefadroxil based on urine cultures

## 2021-03-13 NOTE — ED Notes (Signed)
Pt stated that he felt constipated and unsure when his last BM was. Pt stated that "it hurts down there when I try to go."  Pt requested something for pain and for something to help pt go to the bathroom. Notified provider which prescribed a stool softener.

## 2021-03-13 NOTE — Progress Notes (Signed)
Date and time results received: 03/13/21 1240 (use smartphrase ".now" to insert current time)  Test: MRSA of Nares Critical Value: Positive  Name of Provider Notified: Dr Tat  Orders Received? Or Actions Taken?: Actions Taken: Put in standing orders for Bactroban ointment BID. Dr Tat aware.

## 2021-03-13 NOTE — Assessment & Plan Note (Deleted)
Albumin 2.5, protein supplement to be provided

## 2021-03-13 NOTE — Assessment & Plan Note (Signed)
Continue memantine

## 2021-03-13 NOTE — Assessment & Plan Note (Deleted)
Continue ISS and hypoglycemic protocol Continue Semglee 10 units daily and adjust dose accordingly

## 2021-03-13 NOTE — Consult Note (Signed)
Cardiology Consultation:   Patient ID: Mike Briggs MRN: [id number]; DOB: March 09, 1928  Admit date: 03/12/2021 Date of Consult: 03/13/2021  PCP:  [name], MD   [facility] Providers Cardiologist:  Dr Domenic Polite   Patient Profile:   Mike Briggs is a 88 y.o. male with a hx of HTN, 2:1 av block, dementia who is being seen 03/13/2021 for the evaluation of bradycardia at the request of Dr Tat.  History of Present Illness:   Mike Briggs 88 yo male wheel chair bound, HTN, hyperlipidmeia, 2:1 heart block, PACs/PVCs, NSVT presents with AMS. Being managed for UTI by primary team. During admission episodes of bradycardia to the 30s, cardiology consulted. Bp's have been stable, patient denies any symptoms.   Seen by Dr Johnsie Cancel hospital consult 01/27/2021 2:1 heart block that was asymptomatic. Due to age, nonambulatory status, and dementia deemed not a pacemaker candidate.   K 4.4 Cr 1.35 BUN 26 WBC 10.1 Hgb 11.8 Plt 312  TSH 0.9 Pro calc <0.10 COVID neg Flu neg CXR no acute process CT head: atrophy, chronic microvasc disease EKG SR, mobitz I [place] block. Follow up EKG 2:1 AV block  12/2020 echo: LVE 60-65%, no WMAs    Past Medical History:  Diagnosis Date   Acute cystitis 12/17/2018   Acute encephalopathy 11/14/2018   ANKLE, ARTHRITIS, DEGEN./OSTEO 12/16/2008   Qualifier: Diagnosis of  By: Aline Brochure MD, Dorothyann Peng     Arthritis    Cataract    Chronic kidney disease    COVID-19    COVID-19 virus detected 11/03/2018   COVID-19 virus infection 11/03/2018   Dehydration 11/02/2018   Depression    Diabetes mellitus    Gram-positive bacteremia 11/03/2018   Hyperlipidemia    Hypertension    Hypomagnesemia 03/24/2019   Klebsiella UTI  03/25/2019   Pain in finger of right hand 05/27/2019   Personal history of gout 05/22/2016   Rhabdomyolysis 11/02/2018   Stroke ([place])    Swelling of both hands 05/27/2019   Syncope 02/25/2018   TIA (transient ischemic attack) 11/14/2018   UTI  (urinary tract infection) 02/26/2018   Vitamin D deficiency 05/31/2016    Past Surgical History:  Procedure Laterality Date   BACK SURGERY     cervical and lumbar   CATARACT EXTRACTION     CHOLECYSTECTOMY     FOOT SURGERY     5th toe    SPINE SURGERY     lumbar and cervical fusions      Inpatient Medications: Scheduled Meds:  [START ON 03/14/2021] atorvastatin  10 mg Oral Daily   Chlorhexidine Gluconate Cloth  6 each Topical Q0600   enoxaparin (LOVENOX) injection  40 mg Subcutaneous Daily   feeding supplement (GLUCERNA SHAKE)  237 mL Oral TID BM   insulin aspart  0-9 Units Subcutaneous TID WC   [START ON 03/14/2021] memantine  10 mg Oral Daily   [START ON 03/14/2021] pantoprazole  40 mg Oral Daily   [START ON 03/14/2021] tamsulosin  0.4 mg Oral QPC breakfast   Continuous Infusions:  sodium chloride 125 mL/hr at 03/13/21 0600   [START ON 03/14/2021] cefTRIAXone (ROCEPHIN)  IV     PRN Meds: acetaminophen **OR** acetaminophen, bisacodyl  Allergies:   No Known Allergies  Social History:   Social History   Socioeconomic History   Marital status: Divorced    Spouse name: Not on file   Number of children: 3   Years of education: 10   Highest education level: Not on file  Occupational  History   Occupation: Retired.  Tobacco Use   Smoking status: Former    Types: Cigarettes    Quit date: 08/07/1967    Years since quitting: 53.6   Smokeless tobacco: Never  Vaping Use   Vaping Use: Never used  Substance and Sexual Activity   Alcohol use: No   Drug use: No   Sexual activity: Not Currently  Other Topics Concern   Not on file  Social History Narrative   Wheelchair bound.Has not walked in many years, at least five years.  Lives alone. Has 2 aides that come in to help him.   Lives alone.    Retired.   Eats all food groups.    Has 3 children but does not have contact with them.   Divorced.   Social Determinants of Health   Financial Resource Strain: Low Risk     Difficulty of Paying Living Expenses: Not hard at all  Food Insecurity: No Food Insecurity   Worried About Charity fundraiser in the Last Year: Never true   [place] in the Last Year: Never true  Transportation Needs: No Transportation Needs   Lack of Transportation (Medical): No   Lack of Transportation (Non-Medical): No  Physical Activity: Unknown   Days of Exercise per Week: Patient refused   Minutes of Exercise per Session: Not on file  Stress: Stress Concern Present   Feeling of Stress : To some extent  Social Connections: Socially Isolated   Frequency of Communication with Friends and Family: Never   Frequency of Social Gatherings with Friends and Family: Once a week   Attends Religious Services: Never   Marine scientist or Organizations: No   Attends Music therapist: Never   Marital Status: Divorced  Human resources officer Violence: Not At Risk   Fear of Current or Ex-Partner: No   Emotionally Abused: No   Physically Abused: No   Sexually Abused: No    Family History:    Family History  Family history unknown: Yes     ROS:  Please see the history of present illness.   All other ROS reviewed and negative.     Physical Exam/Data:   Vitals:   03/13/21 0852 03/13/21 0859 03/13/21 1037 03/13/21 1100  BP:   (!) 135/43 126/74  Pulse:      Resp:      Temp:  (!) 97.5 F (36.4 C)    TempSrc:  Axillary    SpO2:    (P) 96%  Weight: 92.5 kg     Height: 5\' 11"  (1.803 m)       Intake/Output Summary (Last 24 hours) at 03/13/2021 1113 Last data filed at 03/13/2021 0853 Gross per 24 hour  Intake 146.36 ml  Output --  Net 146.36 ml   Last 3 Weights 03/13/2021 03/12/2021 02/24/2021  Weight (lbs) 203 lb 14.8 oz 209 lb 7 oz 230 lb  Weight (kg) 92.5 kg 95 kg 104.327 kg     Body mass index is 28.44 kg/m.  General:  Well nourished, well developed, in no acute distress HEENT: normal Neck: no JVD Vascular: No carotid bruits; Distal pulses 2+  bilaterally Cardiac:  normal S1, S2; RRR; no murmur  Lungs:  clear to auscultation bilaterally, no wheezing, rhonchi or rales  Abd: soft, nontender, no hepatomegaly  Ext: no edema Musculoskeletal:  No deformities, BUE and BLE strength normal and equal Skin: warm and dry  Neuro:  A&O x 2 Psych:  Normal affect  Relevant CV Studies:   Laboratory Data:  High Sensitivity Troponin:  No results for input(s): TROPONINIHS in the last 720 hours.   Chemistry Recent Labs  Lab 03/12/21 2305 03/13/21 0517  NA 137 139  K 4.4 3.8  CL 103 104  CO2 23 23  GLUCOSE 174* 105*  BUN 26* 26*  CREATININE 1.35* 1.27*  CALCIUM 8.7* 8.6*  MG  --  1.6*  GFRNONAA 50* 54*  ANIONGAP 11 12    Recent Labs  Lab 03/12/21 2305 03/13/21 0517  PROT 7.3 7.3  ALBUMIN 2.5* 2.6*  AST 17 15  ALT 11 12  ALKPHOS 114 115  BILITOT 1.0 0.6   Lipids No results for input(s): CHOL, TRIG, HDL, LABVLDL, LDLCALC, CHOLHDL in the last 168 hours.  Hematology Recent Labs  Lab 03/12/21 2305 03/13/21 0517  WBC 10.1 11.6*  RBC 4.05* 3.78*  HGB 11.8* 10.7*  HCT 38.2* 35.7*  MCV 94.3 94.4  MCH 29.1 28.3  MCHC 30.9 30.0  RDW 13.7 13.9  PLT 312 296   Thyroid  Recent Labs  Lab 03/13/21 0517  TSH 0.921    BNPNo results for input(s): BNP, PROBNP in the last 168 hours.  DDimer No results for input(s): DDIMER in the last 168 hours.   Radiology/Studies:  CT HEAD WO CONTRAST  Result Date: 03/12/2021 CLINICAL DATA:  Mental status change, unknown cause altered EXAM: CT HEAD WITHOUT CONTRAST TECHNIQUE: Contiguous axial images were obtained from the base of the skull through the vertex without intravenous contrast. RADIATION DOSE REDUCTION: This exam was performed according to the departmental dose-optimization program which includes automated exposure control, adjustment of the mA and/or kV according to patient size and/or use of iterative reconstruction technique. COMPARISON:  09/16/2019 FINDINGS: Brain: There is  atrophy and chronic small vessel disease changes. No acute intracranial abnormality. Specifically, no hemorrhage, hydrocephalus, mass lesion, acute infarction, or significant intracranial injury. Vascular: No hyperdense vessel or unexpected calcification. Skull: No acute calvarial abnormality. Sinuses/Orbits: No acute findings Other: None IMPRESSION: Atrophy, chronic microvascular disease. No acute intracranial abnormality. Electronically Signed   By: Rolm Baptise M.D.   On: 03/12/2021 23:33   DG Chest Port 1 View  Result Date: 03/12/2021 CLINICAL DATA:  Altered mental status.  History of diabetes. EXAM: PORTABLE CHEST 1 VIEW COMPARISON:  02/07/2021 FINDINGS: Artifact overlies the chest. Poor inspiration. Allowing for that, no active disease is identified. No evidence of consolidation, collapse or effusion. IMPRESSION: Poor inspiration. Overlying artifact. No active disease identified. Electronically Signed   By: Nelson Chimes M.D.   On: 03/12/2021 23:30     Assessment and Plan:   1.Bradycardia -long history of bradycardia noted on prior admissions with second degree mobitz I and 2:1 av block previously noted. NOted again this admission, 2:1 av block with rates to high 30s at times.  - he is not on any av nodal agents. Normal TSH - from prior cardiology inpatient evaluations deemed not a pacemaker candidate in general given advanced age, dementia, bed bound. - bp's have been fine, no specific symptoms   - no symptoms, hemodynamically stable. No clinical indication for pacemaker - in general I agree with prior evaluation that patient is not a candidate for pacemaker given advanced age, dementia, poor funcitonal status.  - no plans for any further cardiac testing or intervention.     2.UTI - per primary team   We will sign off inpatient care     Signed, Carlyle Dolly, MD  03/13/2021 11:13 AM

## 2021-03-13 NOTE — Assessment & Plan Note (Signed)
Baseline creatinine 1.3-1.5 Monitor serial BMPs

## 2021-03-13 NOTE — ED Notes (Signed)
Pt placed on bedpan

## 2021-03-13 NOTE — Assessment & Plan Note (Addendum)
Initially Holding amlodipine, lisinopril due to soft BP Restart amlodipine 2/14 Will restart lisinopril tomorrow if blood pressure remains elevated

## 2021-03-13 NOTE — Assessment & Plan Note (Addendum)
Continue Flomax Patient noted to have urinary retention and had foley catheter placed after having several I and O cath Family reports that he is not on chronic catheter Will discontinue foley for voiding trial If he continues to retain may need to have foley replaced until follow up with urology He has an appointment with urology on 2/23

## 2021-03-13 NOTE — ED Notes (Signed)
Checked on pt 10 minutes after placing on bedpan and pt asked to stay on bedpan a little longer. Checked on pt 5 minutes later and pt was asleep. Took pt off of bedpan (no BM at this time) and provided peri care. Repositioned pt.

## 2021-03-13 NOTE — Assessment & Plan Note (Addendum)
Remain on telemetry Appreciate cardiology consult Avoid AV nodal blockade agents

## 2021-03-13 NOTE — Assessment & Plan Note (Deleted)
BUN/creatinine 26/1.35 ( creatinine is within baseline range) Renally adjust medications, avoid nephrotoxic agents/dehydration/hypotension

## 2021-03-13 NOTE — Hospital Course (Addendum)
88 year old male with a history of stroke, hypertension, hyperlipidemia, diabetes mellitus type 2, CKD stage III, cognitive impairment, BPH presenting with acute mental status.  Apparently, the patient was talking to people that were not present.  The patient has had increasing generalized weakness.  At baseline, the patient lives alone and is able to get around with minimal assistance albeit he spends most of the day transferring just from bed to the chair and back.  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  He says he does not see them often because they all work.  He does not drive.  His niece is also helping.  He has a home health aide that helps him 4 hours/day and another one that helps 2 hours/day.  Apparently, his family came to check up on him and noted him to be confused.  As result, EMS was activated.  There were no reports of chest pain, shortness breath, nausea, vomiting, diarrhea, fevers, chills.  His only new meds are gabapentin and meloxicam. At the time of my evaluation on the morning of 03/13/2021, the patient was awake but clearly somnolent.  He awakens to voice.  He he does follow simple one-step commands and is able to answer simple questions but falls asleep without stimuli. In the ED, the patient was afebrile and hemodynamically stable.  Oxygen saturation was 97% on room air.  Subsequently, the patient became hypothermic with temperature of 94.0 F.  He was placed on a warming blanket.  BMP showed showed sodium 139, potassium 3.8, serum creatinine 1.27.  WBC 10.1, hemoglobin 11.8, platelets 212,000.  LFTs unremarkable.  UA showed >50 WBC.  UDS was negative.  EKG showed sinus rhythm with Mobitz type I second-degree AV block.  VBG showed 7.3 89/41/38/24 on room air.  CT of the brain was negative for acute findings.  The patient was started on ceftriaxone.  He was admitted for further evaluation and treatment of his altered mental status.

## 2021-03-13 NOTE — Assessment & Plan Note (Signed)
Continue Protonix °

## 2021-03-13 NOTE — Progress Notes (Signed)
Patient heart rate on telemetry noted to dip in to the high 30's. Reads a-fib at times and SV Mike Briggs. Heart rate varies and jumps up in to the 60's. Patient appears to be sleeping and denies any chest pain or discomfort. Dr Tat made aware of heart rates. Dr Harl Bowie also notified and will be up to see.

## 2021-03-13 NOTE — H&P (Signed)
History and Physical    Patient: Mike Briggs WPV:[id number] DOB: October 21, 1928 DOA: 03/12/2021 DOS: the patient was seen and examined on 03/13/2021 PCP: [name], MD  Patient coming from: Home  Chief Complaint:  Chief Complaint  Patient presents with   Altered Mental Status    HPI: Mike Briggs is a 88 y.o. male with medical history significant of hypertension, hyperlipidemia, CKD stage III, T2DM, BPH and dementia who presents to the emergency department via EMS due to altered mental status.  Patient complained of burning sensation that has been ongoing for few weeks, but he could not give further details of why he came to the ED.  History was obtained from the ED physician and ED medical record.  Per report, patient lives alone and was capable of IADL at baseline.  Family checked on him yesterday in the evening around 5 PM and was noted to be confused and states that patient usually have UTI whenever he has some confusion.  EMS was activated and patient was taken to the ED for further evaluation and management.  There was no report of chest pain, shortness of breath, headache, nausea, vomiting, abdominal pain, diarrhea or constipation.  ED course: In the emergency department, BP was 139/94 and her vital signs were within normal range.  Work-up in ED showed normocytic anemia, BUN/creatinine 26/1.35 ( creatinine is within baseline range), ammonia and alcohol levels are normal, CBG 174, albumin 2.5, urinalysis was positive for UTI.  Influenza A, B, SARS coronavirus 2 was negative. CTA head without contrast showed no acute intracranial abnormality Chest x-ray showed Poor inspiration. Overlying artifact. No active disease identified. He was treated with IV ceftriaxone, IV hydration was provided.  Hospitalist was asked to admit patient for further evaluation and management.   Review of Systems: As mentioned in the history of present illness. All other systems reviewed and are  negative. Past Medical History:  Diagnosis Date   Acute cystitis 12/17/2018   Acute encephalopathy 11/14/2018   ANKLE, ARTHRITIS, DEGEN./OSTEO 12/16/2008   Qualifier: Diagnosis of  By: Aline Brochure MD, Dorothyann Peng     Arthritis    Cataract    Chronic kidney disease    COVID-19    COVID-19 virus detected 11/03/2018   COVID-19 virus infection 11/03/2018   Dehydration 11/02/2018   Depression    Diabetes mellitus    Gram-positive bacteremia 11/03/2018   Hyperlipidemia    Hypertension    Hypomagnesemia 03/24/2019   Klebsiella UTI  03/25/2019   Pain in finger of right hand 05/27/2019   Personal history of gout 05/22/2016   Rhabdomyolysis 11/02/2018   Stroke ([place])    Swelling of both hands 05/27/2019   Syncope 02/25/2018   TIA (transient ischemic attack) 11/14/2018   UTI (urinary tract infection) 02/26/2018   Vitamin D deficiency 05/31/2016   Past Surgical History:  Procedure Laterality Date   BACK SURGERY     cervical and lumbar   CATARACT EXTRACTION     CHOLECYSTECTOMY     FOOT SURGERY     5th toe    SPINE SURGERY     lumbar and cervical fusions   Social History:  reports that he quit smoking about 53 years ago. His smoking use included cigarettes. He has never used smokeless tobacco. He reports that he does not drink alcohol and does not use drugs.  No Known Allergies  Family History  Family history unknown: Yes    Prior to Admission medications   Medication Sig Start Date End Date  Taking? Authorizing Provider  amLODipine (NORVASC) 10 MG tablet TAKE ONE TABLET BY MOUTH ONCE DAILY 01/25/21   [name], MD  aspirin 81 MG chewable tablet Chew 1 tablet (81 mg total) by mouth daily with breakfast. 12/04/19   Perlie Mayo, NP  atorvastatin (LIPITOR) 10 MG tablet TAKE 1 TABLET BY MOUTH AFTER SUPPER. 02/22/21   [name], MD  blood glucose meter kit and supplies Dispense based on patient and insurance preference. Use up to four times daily as directed. (FOR ICD-10 E10.9, E11.9).  04/05/20   [name], MD  gabapentin (NEURONTIN) 300 MG capsule Take 1 capsule (300 mg total) by mouth at bedtime. 03/08/21   [name], MD  LANTUS SOLOSTAR 100 UNIT/ML Solostar Pen Inject 20 Units into the skin daily. 10/10/20   [name], MD  lisinopril (ZESTRIL) 40 MG tablet TAKE (1) TABLET BY MOUTH ONCE DAILY. 02/22/21   [name], MD  meloxicam (MOBIC) 7.5 MG tablet Take 1 tablet (7.5 mg total) by mouth daily. 03/08/21 06/06/21  [name], MD  memantine (NAMENDA) 10 MG tablet TAKE (1) TABLET BY MOUTH TWICE DAILY. 02/22/21   [name], MD  mupirocin cream (BACTROBAN) 2 % Apply 1 application topically 2 (two) times daily. 04/05/20   [name], MD  pantoprazole (PROTONIX) 40 MG tablet Take 1 tablet (40 mg total) by mouth daily. 01/28/21   Orson Eva, MD  SURE COMFORT PEN NEEDLES 31G X 5 MM MISC USE TO INJECT AS DIRECTED FOUR TIMES A DAY. WITH MEALS AND BEFORE BEDTIME. 08/17/20   [name], MD  tamsulosin (FLOMAX) 0.4 MG CAPS capsule TAKE 1 CAPSULE BY MOUTH DAILY AFTER BREAKFAST. 02/23/21   Irine Seal, MD    Physical Exam: Vitals:   03/13/21 0000 03/13/21 0030 03/13/21 0100 03/13/21 0130  BP: (!) 145/84 129/84 134/65 112/90  Pulse: 96 75 (!) 101 90  Resp: $Remo'13 17 20 16  'nQbWb$ Temp:      TempSrc:      SpO2: 96% 95% 97% 94%  Weight:      Height:       General: Elderly male. Awake and alert and oriented x3, though with some confusion. Not in any acute distress.  HEENT: NCAT.  PERRLA. EOMI. Sclerae anicteric.  Dry mucosal membranes. Neck: Neck supple without lymphadenopathy. No carotid bruits. No masses palpated.  Cardiovascular: Regular rate with normal S1-S2 sounds. No murmurs, rubs or gallops auscultated. No JVD.  Respiratory: Clear breath sounds.  No accessory muscle use. Abdomen: Soft, nontender, nondistended. Active bowel sounds. No masses or hepatosplenomegaly  Skin: No rashes, lesions, or ulcerations.  Dry, warm to touch. Musculoskeletal:  2+ dorsalis  pedis and radial pulses. Good ROM.  No contractures  Psychiatric: Mood appropriate to current condition. Neurologic: No focal neurological deficits. Strength is 5/5 x 4.  CN II - XII grossly intact.   Data Reviewed: Normal sinus rhythm at a rate of 66 bpm with VPCs  Assessment and Plan: * UTI (urinary tract infection)- (present on admission) Patient complains of several weeks of burning sensation with urination He was started on IV ceftriaxone, we shall continue with same at this time Urinalysis was positive for UTI Urine culture pending  Hyperglycemia due to diabetes mellitus ([place]) Continue ISS and hypoglycemic protocol Continue Semglee 10 units daily and adjust dose accordingly  Hypoalbuminemia due to protein-calorie malnutrition (HCC) Albumin 2.5, protein supplement to be provided  Essential hypertension- (present on admission) Continue amlodipine, lisinopril  Gastroesophageal reflux disease- (present on admission) Continue Protonix  Hyperlipidemia Continue Lipitor  Chronic kidney disease, stage III (moderate) (HCC)- (present on admission) BUN/creatinine 26/1.35 ( creatinine is within baseline range) Renally adjust medications, avoid nephrotoxic agents/dehydration/hypotension   Dementia without behavioral disturbance (HCC) Continue memantine  BPH (benign prostatic hyperplasia)- (present on admission) Continue Flomax   Advance Care Planning:   Code Status: Full Code   Consults: None  Family Communication: None at bedside  Severity of Illness: The appropriate patient status for this patient is INPATIENT. Inpatient status is judged to be reasonable and necessary in order to provide the required intensity of service to ensure the patient's safety. The patient's presenting symptoms, physical exam findings, and initial radiographic and laboratory data in the context of their chronic comorbidities is felt to place them at high risk for further clinical deterioration.  Furthermore, it is not anticipated that the patient will be medically stable for discharge from the hospital within 2 midnights of admission.   * I certify that at the point of admission it is my clinical judgment that the patient will require inpatient hospital care spanning beyond 2 midnights from the point of admission due to high intensity of service, high risk for further deterioration and high frequency of surveillance required.*  Author: Bernadette Hoit, DO 03/13/2021 2:00 AM  For on call review [email or web address].

## 2021-03-13 NOTE — Progress Notes (Signed)
PROGRESS NOTE  Mike Briggs [id number]:[id number] DOB: [birth year] DOA: 03/12/2021 PCP: [name], MD  Brief History:  88 year old male with a history of stroke, hypertension, hyperlipidemia, diabetes mellitus type 2, CKD stage III, cognitive impairment, BPH presenting with acute mental status.  Apparently, the patient was talking to people that were not present.  The patient has had increasing generalized weakness.  At baseline, the patient lives alone and is able to get around with minimal assistance albeit he spends most of the day transferring just from bed to the chair and back.  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  He says he does not see them often because they all work.  He does not drive.  His niece is also helping.  He has a home health aide that helps him 4 hours/day and another one that helps 2 hours/day.  Apparently, his family came to check up on him and noted him to be confused.  As result, EMS was activated.  There were no reports of chest pain, shortness breath, nausea, vomiting, diarrhea, fevers, chills. At the time of my evaluation on the morning of 03/13/2021, the patient was awake but clearly somnolent.  He awakens to voice.  He he does follow simple one-step commands and is able to answer simple questions but falls asleep without stimuli. In the ED, the patient was afebrile and hemodynamically stable.  Oxygen saturation was 97% on room air.  Subsequently, the patient became hypothermic with temperature of 94.0 F.  He was placed on a warming blanket.  BMP showed showed sodium 139, potassium 3.8, serum creatinine 1.27.  WBC 10.1, hemoglobin 11.8, platelets 212,000.  LFTs unremarkable.  UA showed >50 WBC.  UDS was negative.  EKG showed sinus rhythm with Mobitz type I second-degree AV block.  VBG showed 7.3 89/41/38/24 on room air.  CT of the brain was negative for acute findings.  The patient was started on ceftriaxone.  He was admitted for further evaluation and  treatment of his altered mental status.     Assessment and Plan: * Acute metabolic encephalopathy Secondary to UTI UA>50 WBC VBG 7.3 89/41/38/24 on room air MRI brain if no improvement B12 TSH Ammonia<10  UTI (urinary tract infection)- (present on admission) Pt endorsed dysuria UA>50 WBC Continue ceftriaxone pending culture data  Chronic kidney disease, stage 3b (HCC) Baseline creatinine 1.3-1.5 Monitor serial BMPs  Dementia without behavioral disturbance ([place])- (present on admission) Continue memantine  Gastroesophageal reflux disease- (present on admission) Continue Protonix  Hypomagnesemia Replete Recheck magnesium after repletion  Mobitz type 1 second degree atrioventricular block- (present on admission) Remain on telemetry Avoid AV nodal blockade agents  Hyperlipidemia Continue Lipitor  BPH (benign prostatic hyperplasia)- (present on admission) Continue Flomax  Essential hypertension- (present on admission) Holding amlodipine, lisinopril due to soft BP     Total time spent 50 minutes.  Greater than 50% spent face to face counseling and coordinating care.     Status is: Inpatient Remains inpatient appropriate because: hypothermia with altered mental status            Family Communication:   Family at bedside  Consultants:  niece updated  Code Status:  FULL  DVT Prophylaxis:  [place] Lovenox   Procedures: As Listed in Progress Note Above  Antibiotics: None       Subjective: Patient is awake but falls asleep without stimulation.  Denies cp, sob, abd pain, n/v.  Remainder  ROS unobtainable  Objective: Vitals:   03/13/21 0645 03/13/21 0700 03/13/21 0715 03/13/21 0730  BP:  (!) 100/54  102/60  Pulse: (!) 44 (!) 45 (!) 46   Resp: (!) 8 13 15 14   Temp:      TempSrc:      SpO2: 91% 94% 98%   Weight:      Height:        Intake/Output Summary (Last 24 hours) at 03/13/2021 0742 Last data filed at 03/13/2021 0055 Gross per 24  hour  Intake 100 ml  Output --  Net 100 ml   Weight change:  Exam:  General:  Pt is alert, follows commands appropriately, not in acute distress HEENT: No icterus, No thrush, No neck mass, [place]/AT Cardiovascular: RRR, S1/S2, no rubs, no gallops Respiratory: poor inspiratory effort.  Bibasilar crackles. No wheeze Abdomen: Soft/+BS, non tender, non distended, no guarding Extremities: No edema, No lymphangitis, No petechiae, No rashes, no synovitis   Data Reviewed: I have personally reviewed following labs and imaging studies Basic Metabolic Panel: Recent Labs  Lab 03/12/21 2305 03/13/21 0517  NA 137 139  K 4.4 3.8  CL 103 104  CO2 23 23  GLUCOSE 174* 105*  BUN 26* 26*  CREATININE 1.35* 1.27*  CALCIUM 8.7* 8.6*  MG  --  1.6*  PHOS  --  3.5   Liver Function Tests: Recent Labs  Lab 03/12/21 2305 03/13/21 0517  AST 17 15  ALT 11 12  ALKPHOS 114 115  BILITOT 1.0 0.6  PROT 7.3 7.3  ALBUMIN 2.5* 2.6*   No results for input(s): LIPASE, AMYLASE in the last 168 hours. Recent Labs  Lab 03/12/21 2305  AMMONIA <10   Coagulation Profile: No results for input(s): INR, PROTIME in the last 168 hours. CBC: Recent Labs  Lab 03/12/21 2305 03/13/21 0517  WBC 10.1 11.6*  NEUTROABS 8.1*  --   HGB 11.8* 10.7*  HCT 38.2* 35.7*  MCV 94.3 94.4  PLT 312 296   Cardiac Enzymes: No results for input(s): CKTOTAL, CKMB, CKMBINDEX, TROPONINI in the last 168 hours. BNP: Invalid input(s): POCBNP CBG: Recent Labs  Lab 03/12/21 2336 03/13/21 0216  GLUCAP 158* 131*   HbA1C: No results for input(s): HGBA1C in the last 72 hours. Urine analysis:    Component Value Date/Time   COLORURINE AMBER (A) 03/12/2021 2258   APPEARANCEUR TURBID (A) 03/12/2021 2258   APPEARANCEUR Cloudy (A) 08/25/2020 1024   LABSPEC 1.012 03/12/2021 2258   PHURINE 6.0 03/12/2021 2258   GLUCOSEU NEGATIVE 03/12/2021 2258   HGBUR SMALL (A) 03/12/2021 2258   BILIRUBINUR NEGATIVE 03/12/2021 2258    BILIRUBINUR Negative 08/25/2020 1024   KETONESUR NEGATIVE 03/12/2021 2258   PROTEINUR 100 (A) 03/12/2021 2258   UROBILINOGEN 0.2 03/09/2020 0958   UROBILINOGEN 0.2 03/21/2014 1840   NITRITE NEGATIVE 03/12/2021 2258   LEUKOCYTESUR LARGE (A) 03/12/2021 2258   Sepsis Labs: @LABRCNTIP (procalcitonin:4,lacticidven:4) ) Recent Results (from the past 240 hour(s))  Resp Panel by RT-PCR (Flu A&B, Covid) Nasopharyngeal Swab     Status: None   Collection Time: 03/12/21 10:55 PM   Specimen: Nasopharyngeal Swab; Nasopharyngeal(NP) swabs in vial transport medium  Result Value Ref Range Status   SARS Coronavirus 2 by RT PCR NEGATIVE NEGATIVE Final    Comment: (NOTE) SARS-CoV-2 target nucleic acids are NOT DETECTED.  The SARS-CoV-2 RNA is generally detectable in upper respiratory specimens during the acute phase of infection. The lowest concentration of SARS-CoV-2 viral copies this assay can detect is 138 copies/mL.  A negative result does not preclude SARS-Cov-2 infection and should not be used as the sole basis for treatment or other patient management decisions. A negative result may occur with  improper specimen collection/handling, submission of specimen other than nasopharyngeal swab, presence of viral mutation(s) within the areas targeted by this assay, and inadequate number of viral copies(<138 copies/mL). A negative result must be combined with clinical observations, patient history, and epidemiological information. The expected result is Negative.  Fact Sheet for Patients:  EntrepreneurPulse.com.au  Fact Sheet for Healthcare Providers:  IncredibleEmployment.be  This test is no t yet approved or cleared by the [place] FDA and  has been authorized for detection and/or diagnosis of SARS-CoV-2 by FDA under an Emergency Use Authorization (EUA). This EUA will remain  in effect (meaning this test can be used) for the duration of the COVID-19  declaration under Section 564(b)(1) of the Act, 21 U.S.C.section 360bbb-3(b)(1), unless the authorization is terminated  or revoked sooner.       Influenza A by PCR NEGATIVE NEGATIVE Final   Influenza B by PCR NEGATIVE NEGATIVE Final    Comment: (NOTE) The Xpert Xpress SARS-CoV-2/FLU/RSV plus assay is intended as an aid in the diagnosis of influenza from Nasopharyngeal swab specimens and should not be used as a sole basis for treatment. Nasal washings and aspirates are unacceptable for Xpert Xpress SARS-CoV-2/FLU/RSV testing.  Fact Sheet for Patients: EntrepreneurPulse.com.au  Fact Sheet for Healthcare Providers: IncredibleEmployment.be  This test is not yet approved or cleared by the [place] FDA and has been authorized for detection and/or diagnosis of SARS-CoV-2 by FDA under an Emergency Use Authorization (EUA). This EUA will remain in effect (meaning this test can be used) for the duration of the COVID-19 declaration under Section 564(b)(1) of the Act, 21 U.S.C. section 360bbb-3(b)(1), unless the authorization is terminated or revoked.  Performed at [facility], [address] [place], [place] [address]      Scheduled Meds:  atorvastatin  10 mg Oral Daily   enoxaparin (LOVENOX) injection  40 mg Subcutaneous Daily   feeding supplement (GLUCERNA SHAKE)  237 mL Oral TID BM   insulin aspart  0-9 Units Subcutaneous TID WC   insulin aspart  3 Units Subcutaneous TID WC   memantine  10 mg Oral Daily   pantoprazole  40 mg Oral Daily   tamsulosin  0.4 mg Oral Daily   Continuous Infusions:  sodium chloride 125 mL/hr at 03/13/21 0600   [START ON 03/14/2021] cefTRIAXone (ROCEPHIN)  IV     magnesium sulfate bolus IVPB 2 g (03/13/21 0739)    Procedures/Studies: CT HEAD WO CONTRAST  Result Date: 03/12/2021 CLINICAL DATA:  Mental status change, unknown cause altered EXAM: CT HEAD WITHOUT CONTRAST TECHNIQUE: Contiguous axial images were  obtained from the base of the skull through the vertex without intravenous contrast. RADIATION DOSE REDUCTION: This exam was performed according to the departmental dose-optimization program which includes automated exposure control, adjustment of the mA and/or kV according to patient size and/or use of iterative reconstruction technique. COMPARISON:  09/16/2019 FINDINGS: Brain: There is atrophy and chronic small vessel disease changes. No acute intracranial abnormality. Specifically, no hemorrhage, hydrocephalus, mass lesion, acute infarction, or significant intracranial injury. Vascular: No hyperdense vessel or unexpected calcification. Skull: No acute calvarial abnormality. Sinuses/Orbits: No acute findings Other: None IMPRESSION: Atrophy, chronic microvascular disease. No acute intracranial abnormality. Electronically Signed   By: Rolm Baptise M.D.   On: 03/12/2021 23:33   US Venous Img Upper Right (DVT Study)  Result Date: 02/24/2021 CLINICAL DATA:  RIGHT hand swelling x1 week EXAM: RIGHT UPPER EXTREMITY VENOUS DOPPLER ULTRASOUND TECHNIQUE: Gray-scale sonography with graded compression, as well as color Doppler and duplex ultrasound were performed to evaluate the upper extremity deep venous system from the level of the subclavian vein and including the jugular, axillary, basilic, radial, ulnar and upper cephalic vein. Spectral Doppler was utilized to evaluate flow at rest and with distal augmentation maneuvers. COMPARISON:  RIGHT hand XRs, concurrent.  Chest XR, 02/07/2021. FINDINGS: VENOUS Normal compressibility of the RIGHT internal jugular, subclavian, axillary, cephalic, basilic, brachial, radial and ulnar veins. No filling defects to suggest DVT on grayscale or color Doppler imaging. Doppler waveforms show normal direction of venous flow, normal respiratory plasticity and response to augmentation. Limited views of the contralateral subclavian vein are unremarkable. OTHER No evidence of superficial  thrombophlebitis or abnormal fluid collection. Subcutaneous edema of imaged distal RIGHT extremity Limitations: none IMPRESSION: No evidence of DVT within the RIGHT upper extremity. Michaelle Birks, MD [facility] Electronically Signed   By: Michaelle Birks M.D.   On: 02/24/2021 12:28   DG Chest Port 1 View  Result Date: 03/12/2021 CLINICAL DATA:  Altered mental status.  History of diabetes. EXAM: PORTABLE CHEST 1 VIEW COMPARISON:  02/07/2021 FINDINGS: Artifact overlies the chest. Poor inspiration. Allowing for that, no active disease is identified. No evidence of consolidation, collapse or effusion. IMPRESSION: Poor inspiration. Overlying artifact. No active disease identified. Electronically Signed   By: Nelson Chimes M.D.   On: 03/12/2021 23:30   DG Hand Complete Right  Result Date: 02/24/2021 CLINICAL DATA:  Hand swelling for 1 week.  No known injury. EXAM: RIGHT HAND - COMPLETE 3+ VIEW COMPARISON:  Radiographs 05/27/2019. FINDINGS: Suboptimal evaluation of the fingers due to flexion, similar to the previous study. The mineralization and alignment are normal. There is no evidence of acute fracture or dislocation. There is no evidence of bone destruction. Mild degenerative changes are present throughout the interphalangeal joints and radial aspect of the wrist. 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 The soft tissues are diffusely prominent, especially dorsally. This appears similar to the prior study. No evidence of foreign body. IMPRESSION: 1. Nonspecific diffuse soft tissue prominence, similar to previous study. No evidence of foreign body or soft tissue emphysema. 2. No acute osseous findings.  Stable mild degenerative changes. Electronically Signed   By: Richardean Sale M.D.   On: 02/24/2021 11:41    Orson Eva, DO  [facility]  If 7PM-7AM, please contact  night-coverage [email or web address] Password [facility] 03/13/2021, 7:42 AM   LOS: 0 days

## 2021-03-13 NOTE — TOC Progression Note (Signed)
Transition of Care [facility]) - Progression Note    Patient Details  Name: Mike Briggs MRN: [id number] Date of Birth: 04/13/1928  Transition of Care [facility]) CM/SW Contact  Salome Arnt, Benjamin Perez Phone Number: 03/13/2021, 9:23 AM  Clinical Narrative:   Transition of Care [facility]) Screening Note   Patient Details  Name: Mike Briggs Date of Birth: 28-Nov-1928   Transition of Care [facility]) CM/SW Contact:    Salome Arnt, [place] Phone Number: 03/13/2021, 9:23 AM    Transition of Care Department [facility]) has reviewed patient and no TOC needs have been identified at this time. We will continue to monitor patient advancement through interdisciplinary progression rounds. If new patient transition needs arise, please place a TOC consult.            Expected Discharge Plan and Services                                                 Social Determinants of Health (SDOH) Interventions    Readmission Risk Interventions Readmission Risk Prevention Plan 03/25/2019  Transportation Screening Complete  HRI or [place] Complete  Social Work Consult for [place] Planning/Counseling Complete  Palliative Care Screening Not Applicable  Medication Review [employer]) Complete  Some recent data might be hidden

## 2021-03-14 DIAGNOSIS — R5381 Other malaise: Secondary | ICD-10-CM

## 2021-03-14 DIAGNOSIS — I441 Atrioventricular block, second degree: Secondary | ICD-10-CM

## 2021-03-14 LAB — BASIC METABOLIC PANEL
Anion gap: 12 (ref 5–15)
BUN: 24 mg/dL — ABNORMAL HIGH (ref 8–23)
CO2: 18 mmol/L — ABNORMAL LOW (ref 22–32)
Calcium: 8 mg/dL — ABNORMAL LOW (ref 8.9–10.3)
Chloride: 110 mmol/L (ref 98–111)
Creatinine, Ser: 1.3 mg/dL — ABNORMAL HIGH (ref 0.61–1.24)
GFR, Estimated: 52 mL/min — ABNORMAL LOW (ref >60)
Glucose, Bld: 166 mg/dL — ABNORMAL HIGH (ref 70–99)
Potassium: 3.9 mmol/L (ref 3.5–5.1)
Sodium: 140 mmol/L (ref 135–145)

## 2021-03-14 LAB — GLUCOSE, CAPILLARY
Glucose-Capillary: 113 mg/dL — ABNORMAL HIGH (ref 70–99)
Glucose-Capillary: 120 mg/dL — ABNORMAL HIGH (ref 70–99)
Glucose-Capillary: 145 mg/dL — ABNORMAL HIGH (ref 70–99)
Glucose-Capillary: 190 mg/dL — ABNORMAL HIGH (ref 70–99)

## 2021-03-14 LAB — CBC
HCT: 31.1 % — ABNORMAL LOW (ref 39.0–52.0)
Hemoglobin: 8.9 g/dL — ABNORMAL LOW (ref 13.0–17.0)
MCH: 28 pg (ref 26.0–34.0)
MCHC: 28.6 g/dL — ABNORMAL LOW (ref 30.0–36.0)
MCV: 97.8 fL (ref 80.0–100.0)
Platelets: 247 10*3/uL (ref 150–400)
RBC: 3.18 MIL/uL — ABNORMAL LOW (ref 4.22–5.81)
RDW: 14.2 % (ref 11.5–15.5)
WBC: 9.6 10*3/uL (ref 4.0–10.5)
nRBC: 0 % (ref 0.0–0.2)

## 2021-03-14 LAB — MAGNESIUM: Magnesium: 1.8 mg/dL (ref 1.7–2.4)

## 2021-03-14 MED ORDER — AMLODIPINE BESYLATE 5 MG PO TABS
10.0000 mg | ORAL_TABLET | Freq: Every day | ORAL | Status: DC
  Administered 2021-03-14 – 2021-03-16 (×3): 10 mg via ORAL
  Filled 2021-03-14 (×3): qty 2

## 2021-03-14 NOTE — Assessment & Plan Note (Signed)
Presented with leukocytosis, hypothermia, altered mental status -sepsis physiology resolved -lactate 1.8 -PCT <0.10

## 2021-03-14 NOTE — Progress Notes (Signed)
PROGRESS NOTE  Mike Briggs IBB:[id number] DOB: 1928/10/14 DOA: 03/12/2021 PCP: [name], MD  Brief History:  88 year old male with a history of stroke, hypertension, hyperlipidemia, diabetes mellitus type 2, CKD stage III, cognitive impairment, BPH presenting with acute mental status.  Apparently, the patient was talking to people that were not present.  The patient has had increasing generalized weakness.  At baseline, the patient lives alone and is able to get around with minimal assistance albeit he spends most of the day transferring just from bed to the chair and back.  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  He says he does not see them often because they all work.  He does not drive.  His niece is also helping.  He has a home health aide that helps him 4 hours/day and another one that helps 2 hours/day.  Apparently, his family came to check up on him and noted him to be confused.  As result, EMS was activated.  There were no reports of chest pain, shortness breath, nausea, vomiting, diarrhea, fevers, chills.  His only new meds are gabapentin and meloxicam. At the time of my evaluation on the morning of 03/13/2021, the patient was awake but clearly somnolent.  He awakens to voice.  He he does follow simple one-step commands and is able to answer simple questions but falls asleep without stimuli. In the ED, the patient was afebrile and hemodynamically stable.  Oxygen saturation was 97% on room air.  Subsequently, the patient became hypothermic with temperature of 94.0 F.  He was placed on a warming blanket.  BMP showed showed sodium 139, potassium 3.8, serum creatinine 1.27.  WBC 10.1, hemoglobin 11.8, platelets 212,000.  LFTs unremarkable.  UA showed >50 WBC.  UDS was negative.  EKG showed sinus rhythm with Mobitz type I second-degree AV block.  VBG showed 7.3 89/41/38/24 on room air.  CT of the brain was negative for acute findings.  The patient was started on  ceftriaxone.  He was admitted for further evaluation and treatment of his altered mental status.     Assessment and Plan: * Acute metabolic encephalopathy Secondary to UTI UA>50 WBC VBG 7.3 89/41/38/24 on room air B12--391 TSH--0.921 Ammonia<10 03/14/21--overall improving, near baseline  UTI (urinary tract infection)- (present on admission) Pt endorsed dysuria Preliminary culture = Klebsiella UA>50 WBC Continue ceftriaxone pending culture data  Physical deconditioning PT eval Patient agreeable to SNF if needed  Chronic kidney disease, stage 3b ([place]) Baseline creatinine 1.3-1.5 Monitor serial BMPs  Sepsis secondary to UTI ([place])- (present on admission) Presented with leukocytosis, hypothermia, altered mental status -sepsis physiology resolved -lactate 1.8 -PCT <0.10  Dementia without behavioral disturbance ([place])- (present on admission) Continue memantine  Gastroesophageal reflux disease- (present on admission) Continue Protonix  Hypomagnesemia Replete Recheck magnesium after repletion  Mobitz type 1 second degree atrioventricular block- (present on admission) Remain on telemetry Appreciate cardiology consult Avoid AV nodal blockade agents  Hyperlipidemia Continue Lipitor  BPH (benign prostatic hyperplasia)- (present on admission) Continue Flomax  Essential hypertension- (present on admission) Initially Holding amlodipine, lisinopril due to soft BP Restart amlodipine     Status is: Inpatient Remains inpatient appropriate because: hypothermia with altered mental status                       Family Communication:   niece updated 2/14   Consultants:  none   Code Status:  FULL   DVT Prophylaxis:  Bono Lovenox     Procedures: As Listed in Progress Note Above   Antibiotics: Ceftriaxone 2/13>>        Subjective: Patient denies fevers, chills, headache, chest pain, dyspnea, nausea, vomiting, diarrhea, abdominal pain, dysuria,  hematuria, hematochezia, and melena.   Objective: Vitals:   03/14/21 1600 03/14/21 1700 03/14/21 1800 03/14/21 1900  BP: (!) 167/83 (!) 160/69 (!) 175/79 (!) 186/81  Pulse:      Resp:      Temp:      TempSrc:      SpO2:      Weight:      Height:        Intake/Output Summary (Last 24 hours) at 03/14/2021 1924 Last data filed at 03/14/2021 1912 Gross per 24 hour  Intake 1559.79 ml  Output 1550 ml  Net 9.79 ml   Weight change: -2.5 kg Exam:  General:  Pt is alert, follows commands appropriately, not in acute distress HEENT: No icterus, No thrush, No neck mass, /AT Cardiovascular: RRR, S1/S2, no rubs, no gallops Respiratory: bibasilar rales. No wheeze Abdomen: Soft/+BS, non tender, non distended, no guarding Extremities: No edema, No lymphangitis, No petechiae, No rashes, no synovitis   Data Reviewed: I have personally reviewed following labs and imaging studies Basic Metabolic Panel: Recent Labs  Lab 03/12/21 2305 03/13/21 0517 03/14/21 0433  NA 137 139 140  K 4.4 3.8 3.9  CL 103 104 110  CO2 23 23 18*  GLUCOSE 174* 105* 166*  BUN 26* 26* 24*  CREATININE 1.35* 1.27* 1.30*  CALCIUM 8.7* 8.6* 8.0*  MG  --  1.6* 1.8  PHOS  --  3.5  --    Liver Function Tests: Recent Labs  Lab 03/12/21 2305 03/13/21 0517  AST 17 15  ALT 11 12  ALKPHOS 114 115  BILITOT 1.0 0.6  PROT 7.3 7.3  ALBUMIN 2.5* 2.6*   No results for input(s): LIPASE, AMYLASE in the last 168 hours. Recent Labs  Lab 03/12/21 2305  AMMONIA <10   Coagulation Profile: No results for input(s): INR, PROTIME in the last 168 hours. CBC: Recent Labs  Lab 03/12/21 2305 03/13/21 0517 03/14/21 0433  WBC 10.1 11.6* 9.6  NEUTROABS 8.1*  --   --   HGB 11.8* 10.7* 8.9*  HCT 38.2* 35.7* 31.1*  MCV 94.3 94.4 97.8  PLT 312 296 247   Cardiac Enzymes: No results for input(s): CKTOTAL, CKMB, CKMBINDEX, TROPONINI in the last 168 hours. BNP: Invalid input(s): POCBNP CBG: Recent Labs  Lab  03/13/21 1649 03/13/21 1936 03/14/21 0744 03/14/21 1142 03/14/21 1715  GLUCAP 68* 75 113* 120* 145*   HbA1C: Recent Labs    03/13/21 0517  HGBA1C 5.8*   Urine analysis:    Component Value Date/Time   COLORURINE AMBER (A) 03/12/2021 2258   APPEARANCEUR TURBID (A) 03/12/2021 2258   APPEARANCEUR Cloudy (A) 08/25/2020 1024   LABSPEC 1.012 03/12/2021 2258   PHURINE 6.0 03/12/2021 2258   GLUCOSEU NEGATIVE 03/12/2021 2258   HGBUR SMALL (A) 03/12/2021 2258   BILIRUBINUR NEGATIVE 03/12/2021 2258   BILIRUBINUR Negative 08/25/2020 1024   KETONESUR NEGATIVE 03/12/2021 2258   PROTEINUR 100 (A) 03/12/2021 2258   UROBILINOGEN 0.2 03/09/2020 0958   UROBILINOGEN 0.2 03/21/2014 1840   NITRITE NEGATIVE 03/12/2021 2258   LEUKOCYTESUR LARGE (A) 03/12/2021 2258   Sepsis Labs: @LABRCNTIP (procalcitonin:4,lacticidven:4) ) Recent Results (from the past 240 hour(s))  Resp Panel by RT-PCR (Flu A&B, Covid) Nasopharyngeal Swab  Status: None   Collection Time: 03/12/21 10:55 PM   Specimen: Nasopharyngeal Swab; Nasopharyngeal(NP) swabs in vial transport medium  Result Value Ref Range Status   SARS Coronavirus 2 by RT PCR NEGATIVE NEGATIVE Final    Comment: (NOTE) SARS-CoV-2 target nucleic acids are NOT DETECTED.  The SARS-CoV-2 RNA is generally detectable in upper respiratory specimens during the acute phase of infection. The lowest concentration of SARS-CoV-2 viral copies this assay can detect is 138 copies/mL. A negative result does not preclude SARS-Cov-2 infection and should not be used as the sole basis for treatment or other patient management decisions. A negative result may occur with  improper specimen collection/handling, submission of specimen other than nasopharyngeal swab, presence of viral mutation(s) within the areas targeted by this assay, and inadequate number of viral copies(<138 copies/mL). A negative result must be combined with clinical observations, patient history,  and epidemiological information. The expected result is Negative.  Fact Sheet for Patients:  EntrepreneurPulse.com.au  Fact Sheet for Healthcare Providers:  IncredibleEmployment.be  This test is no t yet approved or cleared by the [place] FDA and  has been authorized for detection and/or diagnosis of SARS-CoV-2 by FDA under an Emergency Use Authorization (EUA). This EUA will remain  in effect (meaning this test can be used) for the duration of the COVID-19 declaration under Section 564(b)(1) of the Act, 21 U.S.C.section 360bbb-3(b)(1), unless the authorization is terminated  or revoked sooner.       Influenza A by PCR NEGATIVE NEGATIVE Final   Influenza B by PCR NEGATIVE NEGATIVE Final    Comment: (NOTE) The Xpert Xpress SARS-CoV-2/FLU/RSV plus assay is intended as an aid in the diagnosis of influenza from Nasopharyngeal swab specimens and should not be used as a sole basis for treatment. Nasal washings and aspirates are unacceptable for Xpert Xpress SARS-CoV-2/FLU/RSV testing.  Fact Sheet for Patients: EntrepreneurPulse.com.au  Fact Sheet for Healthcare Providers: IncredibleEmployment.be  This test is not yet approved or cleared by the [place] FDA and has been authorized for detection and/or diagnosis of SARS-CoV-2 by FDA under an Emergency Use Authorization (EUA). This EUA will remain in effect (meaning this test can be used) for the duration of the COVID-19 declaration under Section 564(b)(1) of the Act, 21 U.S.C. section 360bbb-3(b)(1), unless the authorization is terminated or revoked.  Performed at [facility], [address], [place], [place] [address]   Urine Culture     Status: Abnormal (Preliminary result)   Collection Time: 03/12/21 10:58 PM   Specimen: In/Out Cath Urine  Result Value Ref Range Status   Specimen Description   Final    IN/OUT CATH URINE Performed at [facility], [address], [place], [place] [address]    Special Requests   Final    NONE Performed at [facility], [address], [place], [place] [address]    Culture >=100,000 COLONIES/mL KLEBSIELLA PNEUMONIAE (A)  Final   Report Status PENDING  Incomplete  Culture, blood (Routine X 2) w Reflex to ID Panel     Status: None (Preliminary result)   Collection Time: 03/13/21 12:00 AM   Specimen: Left Antecubital; Blood  Result Value Ref Range Status   Specimen Description LEFT ANTECUBITAL  Final   Special Requests   Final    BOTTLES DRAWN AEROBIC AND ANAEROBIC Blood Culture adequate volume   Culture   Final    NO GROWTH 1 DAY Performed at [facility], [address] [place], [place] [address]    Report Status PENDING  Incomplete  Culture, blood (Routine X 2) w Reflex to ID Panel     Status: None (Preliminary result)   Collection Time: 03/13/21  6:54 AM   Specimen: BLOOD RIGHT HAND  Result Value Ref Range Status   Specimen Description BLOOD RIGHT HAND  Final   Special Requests   Final    BOTTLES DRAWN AEROBIC AND ANAEROBIC Blood Culture adequate volume   Culture   Final    NO GROWTH 1 DAY Performed at [facility], [address] [place], [place] [address]    Report Status PENDING  Incomplete  MRSA Next Gen by PCR, Nasal     Status: Abnormal   Collection Time: 03/13/21  9:11 AM   Specimen: Nasal Mucosa; Nasal Swab  Result Value Ref Range Status   MRSA by PCR Next Gen DETECTED (A) NOT DETECTED Final    Comment: RESULT CALLED TO, READ BACK BY AND VERIFIED WITH: KING J. AT 1237PM ON [id number] BY THOMPSON S. (NOTE) The GeneXpert MRSA Assay (FDA approved for NASAL specimens only), is one component of a comprehensive MRSA colonization surveillance program. It is not intended to diagnose MRSA infection nor to guide or monitor treatment for MRSA infections. Test performance is not FDA approved in patients less than 41 years old. Performed at [facility], [address], [place],   [address]      Scheduled Meds:  amLODipine  10 mg Oral Daily   atorvastatin  10 mg Oral Daily   Chlorhexidine Gluconate Cloth  6 each Topical Q0600   enoxaparin (LOVENOX) injection  40 mg Subcutaneous Daily   feeding supplement (GLUCERNA SHAKE)  237 mL Oral TID BM   insulin aspart  0-9 Units Subcutaneous TID WC   memantine  10 mg Oral Daily   mupirocin ointment   Nasal BID   pantoprazole  40 mg Oral Daily   tamsulosin  0.4 mg Oral QPC breakfast   Continuous Infusions:  sodium chloride 125 mL/hr at 03/14/21 0421   cefTRIAXone (ROCEPHIN)  IV 2 g (03/14/21 0029)    Procedures/Studies: CT HEAD WO CONTRAST  Result Date: 03/12/2021 CLINICAL DATA:  Mental status change, unknown cause altered EXAM: CT HEAD WITHOUT CONTRAST TECHNIQUE: Contiguous axial images were obtained from the base of the skull through the vertex without intravenous contrast. RADIATION DOSE REDUCTION: This exam was performed according to the departmental dose-optimization program which includes automated exposure control, adjustment of the mA and/or kV according to patient size and/or use of iterative reconstruction technique. COMPARISON:  09/16/2019 FINDINGS: Brain: There is atrophy and chronic small vessel disease changes. No acute intracranial abnormality. Specifically, no hemorrhage, hydrocephalus, mass lesion, acute infarction, or significant intracranial injury. Vascular: No hyperdense vessel or unexpected calcification. Skull: No acute calvarial abnormality. Sinuses/Orbits: No acute findings Other: None IMPRESSION: Atrophy, chronic microvascular disease. No acute intracranial abnormality. Electronically Signed   By: Rolm Baptise M.D.   On: 03/12/2021 23:33   US Venous Img Upper Right (DVT Study)  Result Date: 02/24/2021 CLINICAL DATA:  RIGHT hand swelling x1 week EXAM: RIGHT UPPER EXTREMITY VENOUS DOPPLER ULTRASOUND TECHNIQUE: Gray-scale sonography with graded compression, as well as color Doppler and duplex ultrasound were  performed to evaluate the upper extremity deep venous system from the level of the subclavian vein and including the jugular, axillary, basilic, radial, ulnar and upper cephalic vein. Spectral Doppler was utilized to evaluate flow at rest and with distal augmentation maneuvers. COMPARISON:  RIGHT hand XRs, concurrent.  Chest XR, 02/07/2021. FINDINGS: VENOUS Normal compressibility of  the RIGHT internal jugular, subclavian, axillary, cephalic, basilic, brachial, radial and ulnar veins. No filling defects to suggest DVT on grayscale or color Doppler imaging. Doppler waveforms show normal direction of venous flow, normal respiratory plasticity and response to augmentation. Limited views of the contralateral subclavian vein are unremarkable. OTHER No evidence of superficial thrombophlebitis or abnormal fluid collection. Subcutaneous edema of imaged distal RIGHT extremity Limitations: none IMPRESSION: No evidence of DVT within the RIGHT upper extremity. Michaelle Birks, MD [facility]iology Electronically Signed   By: Michaelle Birks M.D.   On: 02/24/2021 12:28   DG Chest Port 1 View  Result Date: 03/12/2021 CLINICAL DATA:  Altered mental status.  History of diabetes. EXAM: PORTABLE CHEST 1 VIEW COMPARISON:  02/07/2021 FINDINGS: Artifact overlies the chest. Poor inspiration. Allowing for that, no active disease is identified. No evidence of consolidation, collapse or effusion. IMPRESSION: Poor inspiration. Overlying artifact. No active disease identified. Electronically Signed   By: Nelson Chimes M.D.   On: 03/12/2021 23:30   DG Hand Complete Right  Result Date: 02/24/2021 CLINICAL DATA:  Hand swelling for 1 week.  No known injury. EXAM: RIGHT HAND - COMPLETE 3+ VIEW COMPARISON:  Radiographs 05/27/2019. FINDINGS: Suboptimal evaluation of the fingers due to flexion, similar to the previous study. The mineralization and alignment are normal. There is no evidence of acute  fracture or dislocation. There is no evidence of bone destruction. Mild degenerative changes are present throughout the interphalangeal joints and radial aspect of the wrist. 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 The soft tissues are diffusely prominent, especially dorsally. This appears similar to the prior study. No evidence of foreign body. IMPRESSION: 1. Nonspecific diffuse soft tissue prominence, similar to previous study. No evidence of foreign body or soft tissue emphysema. 2. No acute osseous findings.  Stable mild degenerative changes. Electronically Signed   By: Richardean Sale M.D.   On: 02/24/2021 11:41    Orson Eva, DO  [facility]  If 7PM-7AM, please contact night-coverage [email or web address] Password [facility] 03/14/2021, 7:24 PM   LOS: 1 day

## 2021-03-14 NOTE — Assessment & Plan Note (Addendum)
PT eval Patient has aides at home. Family agreeable to take patient home with [facility]

## 2021-03-15 DIAGNOSIS — N400 Enlarged prostate without lower urinary tract symptoms: Secondary | ICD-10-CM

## 2021-03-15 LAB — GLUCOSE, CAPILLARY
Glucose-Capillary: 132 mg/dL — ABNORMAL HIGH (ref 70–99)
Glucose-Capillary: 134 mg/dL — ABNORMAL HIGH (ref 70–99)
Glucose-Capillary: 169 mg/dL — ABNORMAL HIGH (ref 70–99)
Glucose-Capillary: 184 mg/dL — ABNORMAL HIGH (ref 70–99)

## 2021-03-15 LAB — CBC
HCT: 30.3 % — ABNORMAL LOW (ref 39.0–52.0)
Hemoglobin: 8.9 g/dL — ABNORMAL LOW (ref 13.0–17.0)
MCH: 28.2 pg (ref 26.0–34.0)
MCHC: 29.4 g/dL — ABNORMAL LOW (ref 30.0–36.0)
MCV: 95.9 fL (ref 80.0–100.0)
Platelets: 239 10*3/uL (ref 150–400)
RBC: 3.16 MIL/uL — ABNORMAL LOW (ref 4.22–5.81)
RDW: 13.9 % (ref 11.5–15.5)
WBC: 8.5 10*3/uL (ref 4.0–10.5)
nRBC: 0 % (ref 0.0–0.2)

## 2021-03-15 LAB — URINE CULTURE: Culture: >=100000 — AB

## 2021-03-15 LAB — BASIC METABOLIC PANEL
Anion gap: 8 (ref 5–15)
BUN: 17 mg/dL (ref 8–23)
CO2: 19 mmol/L — ABNORMAL LOW (ref 22–32)
Calcium: 7.9 mg/dL — ABNORMAL LOW (ref 8.9–10.3)
Chloride: 112 mmol/L — ABNORMAL HIGH (ref 98–111)
Creatinine, Ser: 1.1 mg/dL (ref 0.61–1.24)
GFR, Estimated: >60 mL/min (ref >60)
Glucose, Bld: 172 mg/dL — ABNORMAL HIGH (ref 70–99)
Potassium: 3.7 mmol/L (ref 3.5–5.1)
Sodium: 139 mmol/L (ref 135–145)

## 2021-03-15 LAB — MAGNESIUM: Magnesium: 1.5 mg/dL — ABNORMAL LOW (ref 1.7–2.4)

## 2021-03-15 MED ORDER — MAGNESIUM SULFATE 4 GM/100ML IV SOLN
4.0000 g | Freq: Once | INTRAVENOUS | Status: AC
  Administered 2021-03-15: 4 g via INTRAVENOUS
  Filled 2021-03-15: qty 100

## 2021-03-15 MED ORDER — CEFADROXIL 500 MG PO CAPS
500.0000 mg | ORAL_CAPSULE | Freq: Two times a day (BID) | ORAL | Status: DC
  Administered 2021-03-15 – 2021-03-16 (×2): 500 mg via ORAL
  Filled 2021-03-15 (×4): qty 1

## 2021-03-15 NOTE — Assessment & Plan Note (Signed)
Chronically on lantus, which was held on admission Blood sugars currently stable Continue SSI

## 2021-03-15 NOTE — Progress Notes (Signed)
Progress Note   Patient: Mike Briggs PNT:[id number] DOB: 08/14/1928 DOA: 03/12/2021     2 DOS: the patient was seen and examined on 03/15/2021   Brief hospital course: 88 year old male with a history of stroke, hypertension, hyperlipidemia, diabetes mellitus type 2, CKD stage III, cognitive impairment, BPH presenting with acute mental status.  Apparently, the patient was talking to people that were not present.  The patient has had increasing generalized weakness.  At baseline, the patient lives alone and is able to get around with minimal assistance albeit he spends most of the day transferring just from bed to the chair and back.  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  He says he does not see them often because they all work.  He does not drive.  His niece is also helping.  He has a home health aide that helps him 4 hours/day and another one that helps 2 hours/day.  Apparently, his family came to check up on him and noted him to be confused.  As result, EMS was activated.  There were no reports of chest pain, shortness breath, nausea, vomiting, diarrhea, fevers, chills.  His only new meds are gabapentin and meloxicam. At the time of my evaluation on the morning of 03/13/2021, the patient was awake but clearly somnolent.  He awakens to voice.  He he does follow simple one-step commands and is able to answer simple questions but falls asleep without stimuli. In the ED, the patient was afebrile and hemodynamically stable.  Oxygen saturation was 97% on room air.  Subsequently, the patient became hypothermic with temperature of 94.0 F.  He was placed on a warming blanket.  BMP showed showed sodium 139, potassium 3.8, serum creatinine 1.27.  WBC 10.1, hemoglobin 11.8, platelets 212,000.  LFTs unremarkable.  UA showed >50 WBC.  UDS was negative.  EKG showed sinus rhythm with Mobitz type I second-degree AV block.  VBG showed 7.3 89/41/38/24 on room air.  CT of the brain was negative for acute  findings.  The patient was started on ceftriaxone.  He was admitted for further evaluation and treatment of his altered mental status.  Assessment and Plan: * Acute metabolic encephalopathy Secondary to UTI UA>50 WBC VBG 7.3 89/41/38/24 on room air B12--391 TSH--0.921 Ammonia<10 03/14/21--overall improving, near baseline  Physical deconditioning PT eval Patient has aides at home. Family agreeable to take patient home with HH  Chronic kidney disease, stage 3b ([place]) Baseline creatinine 1.3-1.5 Monitor serial BMPs  Sepsis secondary to UTI [facility])- (present on admission) Presented with leukocytosis, hypothermia, altered mental status -sepsis physiology resolved -lactate 1.8 -PCT <0.10  Dementia without behavioral disturbance ([facility])- (present on admission) Continue memantine  UTI (urinary tract infection)- (present on admission) Pt endorsed dysuria Preliminary culture = Klebsiella UA>50 WBC He was treated with ceftriaxone Transition to cefadroxil based on urine cultures  Gastroesophageal reflux disease- (present on admission) Continue Protonix  Hypomagnesemia Replete Recheck magnesium after repletion  Mobitz type 1 second degree atrioventricular block- (present on admission) Remain on telemetry Appreciate cardiology consult Avoid AV nodal blockade agents  Hyperlipidemia Continue Lipitor  BPH (benign prostatic hyperplasia)- (present on admission) Continue Flomax Patient noted to have urinary retention and had foley catheter placed after having several I and O cath Family reports that he is not on chronic catheter Will discontinue foley for voiding trial If he continues to retain may need to have foley replaced until follow up with urology He has an appointment with urology on 2/23  Essential hypertension- (present on admission) Initially Holding amlodipine, lisinopril due to soft BP Restart amlodipine 2/14 Will restart lisinopril tomorrow if blood pressure remains  elevated   Type 2 diabetes mellitus with diabetic chronic kidney disease ([place])- (present on admission) Chronically on lantus, which was held on admission Blood sugars currently stable Continue SSI        Subjective: he is feeling better  Physical Exam: Vitals:   03/15/21 0719 03/15/21 1100 03/15/21 1151 03/15/21 2048  BP:  (!) 157/57 140/65 (!) 169/66  Pulse:   92 (!) 51  Resp:   19 18  Temp: 99.7 F (37.6 C)  98.4 F (36.9 C) 97.8 F (36.6 C)  TempSrc: Oral  Oral Oral  SpO2:   97% 98%  Weight:      Height:       General exam: Alert, awake, oriented x 3 Respiratory system: Clear to auscultation. Respiratory effort normal. Cardiovascular system:RRR. No murmurs, rubs, gallops. Gastrointestinal system: Abdomen is nondistended, soft and nontender. No organomegaly or masses felt. Normal bowel sounds heard. Central nervous system: Alert and oriented. No focal neurological deficits. Extremities: No C/C/E, +pedal pulses Skin: No rashes, lesions or ulcers Psychiatry: Judgement and insight appear normal. Mood & affect appropriate.    Data Reviewed:  Reviewed CBC and chemistry panel  Family Communication: updated Niece [name] over the phone  Disposition: Status is: Inpatient Remains inpatient appropriate because: continued treatment of UTI and evaluation of urinary retention          Planned Discharge Destination: Home with Home Health     Time spent: 35 minutes  Author: Kathie Dike, MD 03/15/2021 9:14 PM  For on call review [email or web address].

## 2021-03-15 NOTE — TOC Transition Note (Addendum)
Transition of Care [facility]) - CM/SW Discharge Note   Patient Details  Name: Mike Briggs MRN: [id number] Date of Birth: [birth year]  [facility]) CM/SW Contact:  Ihor Gully, LCSW Phone Number: 03/15/2021, 12:03 PM   Clinical Narrative:    Patient from home alone. Has caregivers through 7p.m. Has life alert and life alert calls [name] when it is activated. [name] lives five minutes away. Discussed PT recommendation for SNF. Patient is active with [facility] with HHPT. Family is agreeable to HHPT.   Final next level of care: Home w Home Health Services Barriers to Discharge: No Barriers Identified   Patient Goals and CMS Choice Patient states their goals for this hospitalization and ongoing recovery are:: home with home health      Discharge Placement                       Discharge Plan and Services     Post Acute Care Choice: Home Health                               Social Determinants of Health (SDOH) Interventions     Readmission Risk Interventions Readmission Risk Prevention Plan 03/25/2019  Transportation Screening Complete  HRI or Home Care Consult Complete  Social Work Consult for [place] Planning/Counseling Complete  Palliative Care Screening Not Applicable  Medication Review [employer] photographer) Complete  Some recent data might be hidden

## 2021-03-15 NOTE — Evaluation (Signed)
Physical Therapy Evaluation Patient Details Name: Mike Briggs MRN: [id number] DOB: May 18, 1928 Today's Date: 03/15/2021  History of Present Illness  Mike Briggs is a 88 y.o. male with medical history significant of hypertension, hyperlipidemia, CKD stage III, T2DM, BPH and dementia who presents to the emergency department via EMS due to altered mental status.  Patient complained of burning sensation that has been ongoing for few weeks, but he could not give further details of why he came to the ED.  History was obtained from the ED physician and ED medical record.  Per report, patient lives alone and was capable of IADL at baseline.  Family checked on him yesterday in the evening around 5 PM and was noted to be confused and states that patient usually have UTI whenever he has some confusion.  EMS was activated and patient was taken to the ED for further evaluation and management.  There was no report of chest pain, shortness of breath, headache, nausea, vomiting, abdominal pain, diarrhea or constipation.   Clinical Impression  Patient demonstrates slow labored movement for sitting up at bedside with most difficulty sitting up to the right side due to frequent falling over to the right and unable to maintain sitting balance.  Patient demonstrated improvement for sitting up to the left and able to keep trunk in midline with fair sitting balance, unable to stand due to weakness and required Max assist to reposition when put back to bed.  Patient will benefit from continued skilled physical therapy in hospital and recommended venue below to increase strength, balance, endurance for safe ADLs and gait.         Recommendations for follow up therapy are one component of a multi-disciplinary discharge planning process, led by the attending physician.  Recommendations may be updated based on patient status, additional functional criteria and insurance authorization.  Follow Up Recommendations Skilled  nursing-short term rehab (<3 hours/day)    Assistance Recommended at Discharge Set up Supervision/Assistance  Patient can return home with the following  Two people to help with walking and/or transfers;A lot of help with bathing/dressing/bathroom;Help with stairs or ramp for entrance    Equipment Recommendations None recommended by PT  Recommendations for Other Services       Functional Status Assessment Patient has had a recent decline in their functional status and/or demonstrates limited ability to make significant improvements in function in a reasonable and predictable amount of time     Precautions / Restrictions Precautions Precautions: Fall Restrictions Weight Bearing Restrictions: No      Mobility  Bed Mobility Overal bed mobility: Needs Assistance Bed Mobility: Supine to Sit, Sit to Supine     Supine to sit: Max assist Sit to supine: Max assist   General bed mobility comments: slow labored movement, normally sit up to left side of bed, required Max assist sitting up to the right, Mod/max to the left using bed rail    Transfers                        Ambulation/Gait                  Stairs            Wheelchair Mobility    Modified Rankin (Stroke Patients Only)       Balance Overall balance assessment: Needs assistance Sitting-balance support: Feet supported, No upper extremity supported Sitting balance-Leahy Scale: Fair Sitting balance - Comments: seated at EOB on the  left side, poor sitting at EOB on the right side Postural control: Right lateral lean                                   Pertinent Vitals/Pain Pain Assessment Pain Assessment: No/denies pain    Home Living Family/patient expects to be discharged to:: Private residence Living Arrangements: Alone Available Help at Discharge: Personal care attendant;Family;Available PRN/intermittently Type of Home: Apartment Home Access: Level entry        Home Layout: One level Home Equipment: Rolling Walker (2 wheels);BSC/3in1;Grab bars - tub/shower;Grab bars - toilet;Electric scooter;Hospital bed;Other (comment) Additional Comments: uses overhead trapeze    Prior Function Prior Level of Function : Needs assist       Physical Assist : Mobility (physical);ADLs (physical) Mobility (physical): Bed mobility;Transfers;Gait;Stairs   Mobility Comments: non-ambulatory, assisted for scooting over from bed to power scooter, uses overhead trapeze and assistance for bed mobility ADLs Comments: assisted by family, home aides 8 am to 12 pm 5 days/week     Hand Dominance   Dominant Hand: Right    Extremity/Trunk Assessment   Upper Extremity Assessment Upper Extremity Assessment: Generalized weakness    Lower Extremity Assessment Lower Extremity Assessment: Generalized weakness    Cervical / Trunk Assessment Cervical / Trunk Assessment: Normal  Communication   Communication: No difficulties  Cognition Arousal/Alertness: Awake/alert Behavior During Therapy: WFL for tasks assessed/performed Overall Cognitive Status: Within Functional Limits for tasks assessed                                          General Comments      Exercises     Assessment/Plan    PT Assessment Patient needs continued PT services  PT Problem List Decreased strength;Decreased activity tolerance;Decreased balance;Decreased mobility       PT Treatment Interventions DME instruction;Functional mobility training;Therapeutic activities;Therapeutic exercise;Patient/family education;Wheelchair mobility training;Balance training    PT Goals (Current goals can be found in the Care Plan section)  Acute Rehab PT Goals Patient Stated Goal: return home with family and home aides to assist PT Goal Formulation: With patient Time For Goal Achievement: 03/29/21 Potential to Achieve Goals: Good    Frequency Min 2X/week     Co-evaluation                AM-PAC PT "6 Clicks" Mobility  Outcome Measure Help needed turning from your back to your side while in a flat bed without using bedrails?: A Lot Help needed moving from lying on your back to sitting on the side of a flat bed without using bedrails?: A Lot Help needed moving to and from a bed to a chair (including a wheelchair)?: Total Help needed standing up from a chair using your arms (e.g., wheelchair or bedside chair)?: Total Help needed to walk in hospital room?: Total Help needed climbing 3-5 steps with a railing? : Total 6 Click Score: 8    End of Session   Activity Tolerance: Patient tolerated treatment well;Patient limited by fatigue Patient left: in bed;with call bell/phone within reach Nurse Communication: Mobility status PT Visit Diagnosis: Unsteadiness on feet (R26.81);Other abnormalities of gait and mobility (R26.89);Muscle weakness (generalized) (M62.81)    Time: 3662-9476 PT Time Calculation (min) (ACUTE ONLY): 30 min   Charges:   PT Evaluation $PT Eval Moderate Complexity: 1 Mod PT  Treatments $Therapeutic Activity: 23-37 mins        12:11 PM, 03/15/21 Lonell Grandchild, MPT Physical Therapist with [facility] 336 [phone] office [phone] mobile phone

## 2021-03-15 NOTE — Plan of Care (Signed)
°  Problem: Acute Rehab PT Goals(only PT should resolve) Goal: Pt Will Go Supine/Side To Sit 03/15/2021 1214 by Lonell Grandchild, PT Outcome: Progressing Flowsheets (Taken 03/15/2021 1214) Pt will go Supine/Side to Sit: with moderate assist   Problem: Acute Rehab PT Goals(only PT should resolve) Goal: Pt Will Transfer Bed To Chair/Chair To Bed 03/15/2021 1215 by Lonell Grandchild, PT Outcome: Progressing Flowsheets (Taken 03/15/2021 1215) Pt will Transfer Bed to Chair/Chair to Bed: with mod assist 03/15/2021 1214 by Lonell Grandchild, PT Outcome: Progressing   Problem: Acute Rehab PT Goals(only PT should resolve) Goal: Pt Will Go Sit To Supine/Side Outcome: Progressing Flowsheets (Taken 03/15/2021 1215) Pt will go Sit to Supine/Side: with moderate assist   Problem: Acute Rehab PT Goals(only PT should resolve) Goal: Patient Will Perform Sitting Balance Outcome: Progressing Flowsheets (Taken 03/15/2021 1215) Patient will perform sitting balance:  with modified independence  with supervision   12:17 PM, 03/15/21 Lonell Grandchild, MPT Physical Therapist with [facility] 336 [phone] office [phone] mobile phone

## 2021-03-16 LAB — RENAL FUNCTION PANEL
Albumin: 2.2 g/dL — ABNORMAL LOW (ref 3.5–5.0)
Anion gap: 11 (ref 5–15)
BUN: 13 mg/dL (ref 8–23)
CO2: 18 mmol/L — ABNORMAL LOW (ref 22–32)
Calcium: 8.2 mg/dL — ABNORMAL LOW (ref 8.9–10.3)
Chloride: 108 mmol/L (ref 98–111)
Creatinine, Ser: 1.03 mg/dL (ref 0.61–1.24)
GFR, Estimated: >60 mL/min (ref >60)
Glucose, Bld: 141 mg/dL — ABNORMAL HIGH (ref 70–99)
Phosphorus: 3 mg/dL (ref 2.5–4.6)
Potassium: 3.8 mmol/L (ref 3.5–5.1)
Sodium: 137 mmol/L (ref 135–145)

## 2021-03-16 LAB — GLUCOSE, CAPILLARY
Glucose-Capillary: 129 mg/dL — ABNORMAL HIGH (ref 70–99)
Glucose-Capillary: 116 mg/dL — ABNORMAL HIGH (ref 70–99)

## 2021-03-16 LAB — MAGNESIUM: Magnesium: 1.8 mg/dL (ref 1.7–2.4)

## 2021-03-16 MED ORDER — CEFADROXIL 500 MG PO CAPS: 500.0000 mg | ORAL_CAPSULE | Freq: Two times a day (BID) | ORAL | 0 refills | Status: DC

## 2021-03-16 NOTE — Progress Notes (Signed)
Patient urinated 250 ml of clear, yellow urine. Patient resting in bed at this time.

## 2021-03-16 NOTE — Discharge Summary (Addendum)
Physician Discharge Summary  Mike Briggs TGY:[id number] DOB: 1929/01/29 DOA: 03/12/2021  PCP: [name], MD  Admit date: 03/12/2021 Discharge date: 03/16/2021  Admitted From: home Disposition:  home  Recommendations for Outpatient Follow-up:  Follow up with PCP in 1-2 weeks Please obtain BMP/CBC in one week Follow up with urology next week as previously scheduled  Home Health: [facility] PT/OT Equipment/Devices:  Discharge Condition:stable CODE STATUS:full code Diet recommendation: heart healthy  Brief/Interim Summary: 88 year old male with a history of stroke, hypertension, hyperlipidemia, diabetes mellitus type 2, CKD stage III, cognitive impairment, BPH presenting with acute mental status.  Apparently, the patient was talking to people that were not present.  The patient has had increasing generalized weakness.  At baseline, the patient lives alone and is able to get around with minimal assistance albeit he spends most of the day transferring just from bed to the chair and back.  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  He says he does not see them often because they all work.  He does not drive.  His niece is also helping.  He has a home health aide that helps him 4 hours/day and another one that helps 2 hours/day.  Apparently, his family came to check up on him and noted him to be confused.  As result, EMS was activated.  There were no reports of chest pain, shortness breath, nausea, vomiting, diarrhea, fevers, chills.  His only new meds are gabapentin and meloxicam. At the time of my evaluation on the morning of 03/13/2021, the patient was awake but clearly somnolent.  He awakens to voice.  He he does follow simple one-step commands and is able to answer simple questions but falls asleep without stimuli. In the ED, the patient was afebrile and hemodynamically stable.  Oxygen saturation was 97% on room air.  Subsequently, the patient became hypothermic with temperature of 94.0  F.  He was placed on a warming blanket.  BMP showed showed sodium 139, potassium 3.8, serum creatinine 1.27.  WBC 10.1, hemoglobin 11.8, platelets 212,000.  LFTs unremarkable.  UA showed >50 WBC.  UDS was negative.  EKG showed sinus rhythm with Mobitz type I second-degree AV block.  VBG showed 7.3 89/41/38/24 on room air.  CT of the brain was negative for acute findings.  The patient was started on ceftriaxone.  He was admitted for further evaluation and treatment of his altered mental status.  Discharge Diagnoses:  Principal Problem:   Acute metabolic encephalopathy Active Problems:   Type 2 diabetes mellitus with diabetic chronic kidney disease (HCC)   Essential hypertension   BPH (benign prostatic hyperplasia)   Hyperlipidemia   Mobitz type 1 second degree atrioventricular block   Hypomagnesemia   Gastroesophageal reflux disease   UTI (urinary tract infection)   Dementia without behavioral disturbance (HCC)   Sepsis secondary to UTI ([place])   Chronic kidney disease, stage 3b (HCC)   Physical deconditioning Malnutrition ruled out   Acute metabolic encephalopathy Secondary to UTI UA>50 WBC VBG 7.3 89/41/38/24 on room air B12--391 TSH--0.921 Ammonia<10 03/14/21--overall improving, near baseline   Physical deconditioning PT eval Patient has aides at home. Family agreeable to take patient home with HH   Chronic kidney disease, stage 3b ([place]) Baseline creatinine 1.3-1.5 Monitor serial BMPs   Sepsis secondary to UTI ([place])- (present on admission) Presented with leukocytosis, hypothermia, altered mental status -sepsis physiology resolved -lactate 1.8 -PCT <0.10   Dementia without behavioral disturbance ([place])- (present on admission) Continue memantine  UTI (urinary tract infection)- (present on admission) Pt endorsed dysuria Preliminary culture = Klebsiella UA>50 WBC He was treated with ceftriaxone Transition to cefadroxil based on urine cultures   Gastroesophageal  reflux disease- (present on admission) Continue Protonix   Hypomagnesemia Replete    Mobitz type 1 second degree atrioventricular block- (present on admission) Remain on telemetry Appreciate cardiology consult Avoid AV nodal blockade agents   Hyperlipidemia Continue Lipitor   BPH (benign prostatic hyperplasia)- (present on admission) Continue Flomax Patient noted to have urinary retention and had foley catheter placed after having several I and O cath He underwent voiding trial and was able to pass urine He has an appointment with urology on 2/23   Essential hypertension- (present on admission) Initially Holding amlodipine, lisinopril due to soft BP Amlodipine and lisinopril restarted on discharge     Type 2 diabetes mellitus with diabetic chronic kidney disease ([place])- (present on admission) Chronically on lantus, which was held on admission Blood sugars currently stable Restart lantus on discharge  Pressure injury Right and left buttocks pressure injury, stage 2, present on admission Pressure Injury 11/08/18 Coccyx Stage III -  Full thickness tissue loss. Subcutaneous fat may be visible but bone, tendon or muscle are NOT exposed. open area with white tissues  (Active)  11/08/18 0900  Location: Coccyx  Location Orientation:   Staging: Stage III -  Full thickness tissue loss. Subcutaneous fat may be visible but bone, tendon or muscle are NOT exposed.  Wound Description (Comments): open area with white tissues   Present on Admission: No     Pressure Injury 03/13/21 Buttocks Right;Medial Stage 2 -  Partial thickness loss of dermis presenting as a shallow open injury with a red, pink wound bed without slough. (Active)  03/13/21 0845  Location: Buttocks  Location Orientation: Right;Medial  Staging: Stage 2 -  Partial thickness loss of dermis presenting as a shallow open injury with a red, pink wound bed without slough.  Wound Description (Comments):   Present on Admission:  Yes     Pressure Injury 03/13/21 Buttocks Left;Medial Stage 2 -  Partial thickness loss of dermis presenting as a shallow open injury with a red, pink wound bed without slough. (Active)  03/13/21 0845  Location: Buttocks  Location Orientation: Left;Medial  Staging: Stage 2 -  Partial thickness loss of dermis presenting as a shallow open injury with a red, pink wound bed without slough.  Wound Description (Comments):   Present on Admission: Yes        Discharge Instructions  Discharge Instructions     Diet - low sodium heart healthy   Complete by: As directed    Discharge wound care:   Complete by: As directed    Keep buttock wounds reinforced with padded dressing and turn frequently to redistribute weight   Increase activity slowly   Complete by: As directed       Allergies as of 03/16/2021   No Known Allergies      Medication List     STOP taking these medications    gabapentin 300 MG capsule Commonly known as: NEURONTIN   meloxicam 7.5 MG tablet Commonly known as: Mobic       TAKE these medications    amLODipine 10 MG tablet Commonly known as: NORVASC TAKE ONE TABLET BY MOUTH ONCE DAILY   aspirin 81 MG chewable tablet Chew 1 tablet (81 mg total) by mouth daily with breakfast.   atorvastatin 10 MG tablet Commonly known as: LIPITOR TAKE 1 TABLET  BY MOUTH AFTER SUPPER.   blood glucose meter kit and supplies Dispense based on patient and insurance preference. Use up to four times daily as directed. (FOR ICD-10 E10.9, E11.9).   cefadroxil 500 MG capsule Commonly known as: DURICEF Take 1 capsule (500 mg total) by mouth 2 (two) times daily.   Lantus SoloStar 100 UNIT/ML Solostar Pen Generic drug: insulin glargine Inject 20 Units into the skin daily.   lisinopril 40 MG tablet Commonly known as: ZESTRIL TAKE (1) TABLET BY MOUTH ONCE DAILY.   memantine 10 MG tablet Commonly known as: NAMENDA TAKE (1) TABLET BY MOUTH TWICE DAILY.   mupirocin cream  2 % Commonly known as: BACTROBAN Apply 1 application topically 2 (two) times daily.   pantoprazole 40 MG tablet Commonly known as: PROTONIX Take 1 tablet (40 mg total) by mouth daily.   Sure Comfort Pen Needles 31G X 5 MM Misc Generic drug: Insulin Pen Needle USE TO INJECT AS DIRECTED FOUR TIMES A DAY. WITH MEALS AND BEFORE BEDTIME.   tamsulosin 0.4 MG Caps capsule Commonly known as: FLOMAX TAKE 1 CAPSULE BY MOUTH DAILY AFTER BREAKFAST.               Discharge Care Instructions  (From admission, onward)           Start     Ordered   03/16/21 0000  Discharge wound care:       Comments: Keep buttock wounds reinforced with padded dressing and turn frequently to redistribute weight   03/16/21 1333            No Known Allergies  Consultations:    Procedures/Studies: CT HEAD WO CONTRAST  Result Date: 03/12/2021 CLINICAL DATA:  Mental status change, unknown cause altered EXAM: CT HEAD WITHOUT CONTRAST TECHNIQUE: Contiguous axial images were obtained from the base of the skull through the vertex without intravenous contrast. RADIATION DOSE REDUCTION: This exam was performed according to the departmental dose-optimization program which includes automated exposure control, adjustment of the mA and/or kV according to patient size and/or use of iterative reconstruction technique. COMPARISON:  09/16/2019 FINDINGS: Brain: There is atrophy and chronic small vessel disease changes. No acute intracranial abnormality. Specifically, no hemorrhage, hydrocephalus, mass lesion, acute infarction, or significant intracranial injury. Vascular: No hyperdense vessel or unexpected calcification. Skull: No acute calvarial abnormality. Sinuses/Orbits: No acute findings Other: None IMPRESSION: Atrophy, chronic microvascular disease. No acute intracranial abnormality. Electronically Signed   By: Rolm Baptise M.D.   On: 03/12/2021 23:33   US Venous Img Upper Right (DVT Study)  Result Date:  02/24/2021 CLINICAL DATA:  RIGHT hand swelling x1 week EXAM: RIGHT UPPER EXTREMITY VENOUS DOPPLER ULTRASOUND TECHNIQUE: Gray-scale sonography with graded compression, as well as color Doppler and duplex ultrasound were performed to evaluate the upper extremity deep venous system from the level of the subclavian vein and including the jugular, axillary, basilic, radial, ulnar and upper cephalic vein. Spectral Doppler was utilized to evaluate flow at rest and with distal augmentation maneuvers. COMPARISON:  RIGHT hand XRs, concurrent.  Chest XR, 02/07/2021. FINDINGS: VENOUS Normal compressibility of the RIGHT internal jugular, subclavian, axillary, cephalic, basilic, brachial, radial and ulnar veins. No filling defects to suggest DVT on grayscale or color Doppler imaging. Doppler waveforms show normal direction of venous flow, normal respiratory plasticity and response to augmentation. Limited views of the contralateral subclavian vein are unremarkable. OTHER No evidence of superficial thrombophlebitis or abnormal fluid collection. Subcutaneous edema of imaged distal RIGHT extremity Limitations: none IMPRESSION: No evidence of  DVT within the RIGHT upper extremity. Michaelle Birks, MD [facility]iology Electronically Signed   By: Michaelle Birks M.D.   On: 02/24/2021 12:28   DG Chest Port 1 View  Result Date: 03/12/2021 CLINICAL DATA:  Altered mental status.  History of diabetes. EXAM: PORTABLE CHEST 1 VIEW COMPARISON:  02/07/2021 FINDINGS: Artifact overlies the chest. Poor inspiration. Allowing for that, no active disease is identified. No evidence of consolidation, collapse or effusion. IMPRESSION: Poor inspiration. Overlying artifact. No active disease identified. Electronically Signed   By: Nelson Chimes M.D.   On: 03/12/2021 23:30   DG Hand Complete Right  Result Date: 02/24/2021 CLINICAL DATA:  Hand swelling for 1 week.  No known injury. EXAM: RIGHT HAND -  COMPLETE 3+ VIEW COMPARISON:  Radiographs 05/27/2019. FINDINGS: Suboptimal evaluation of the fingers due to flexion, similar to the previous study. The mineralization and alignment are normal. There is no evidence of acute fracture or dislocation. There is no evidence of bone destruction. Mild degenerative changes are present throughout the interphalangeal joints and radial aspect of the wrist. 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 The soft tissues are diffusely prominent, especially dorsally. This appears similar to the prior study. No evidence of foreign body. IMPRESSION: 1. Nonspecific diffuse soft tissue prominence, similar to previous study. No evidence of foreign body or soft tissue emphysema. 2. No acute osseous findings.  Stable mild degenerative changes. Electronically Signed   By: Richardean Sale M.D.   On: 02/24/2021 11:41      Subjective: Feeling better, able to pass urine, no hallucinations  Discharge Exam: Vitals:   03/15/21 1100 03/15/21 1151 03/15/21 2048 03/16/21 0443  BP: (!) 157/57 140/65 (!) 169/66 (!) 155/91  Pulse:  92 (!) 51 (!) 107  Resp:  $Remo'19 18 20  'ccQCB$ Temp:  98.4 F (36.9 C) 97.8 F (36.6 C) 98.3 F (36.8 C)  TempSrc:  Oral Oral Oral  SpO2:  97% 98% 95%  Weight:      Height:        General: Pt is alert, awake, not in acute distress Cardiovascular: RRR, S1/S2 +, no rubs, no gallops Respiratory: CTA bilaterally, no wheezing, no rhonchi Abdominal: Soft, NT, ND, bowel sounds + Extremities: no edema, no cyanosis    The results of significant diagnostics from this hospitalization (including imaging, microbiology, ancillary and laboratory) are listed below for reference.     Microbiology: Recent Results (from the past 240 hour(s))  Resp Panel by RT-PCR (Flu A&B, Covid) Nasopharyngeal Swab     Status: None   Collection Time: 03/12/21 10:55 PM   Specimen: Nasopharyngeal Swab; Nasopharyngeal(NP) swabs in vial  transport medium  Result Value Ref Range Status   SARS Coronavirus 2 by RT PCR NEGATIVE NEGATIVE Final    Comment: (NOTE) SARS-CoV-2 target nucleic acids are NOT DETECTED.  The SARS-CoV-2 RNA is generally detectable in upper respiratory specimens during the acute phase of infection. The lowest concentration of SARS-CoV-2 viral copies this assay can detect is 138 copies/mL. A negative result does not preclude SARS-Cov-2 infection and should not be used as the sole basis for treatment or other patient management decisions. A negative result may occur with  improper specimen collection/handling, submission of specimen other than nasopharyngeal swab, presence of viral mutation(s) within the areas targeted by this assay, and inadequate number of viral copies(<138 copies/mL). A negative result must be combined with clinical observations, patient history, and epidemiological information. The expected result is Negative.  Fact Sheet for Patients:  BloggerCourse.com  Fact Sheet for Healthcare Providers:  SeriousBroker.it  This test is no t yet approved or cleared by the [place] FDA and  has been authorized for detection and/or diagnosis of SARS-CoV-2 by FDA under an Emergency Use Authorization (EUA). This EUA will remain  in effect (meaning this test can be used) for the duration of the COVID-19 declaration under Section 564(b)(1) of the Act, 21 U.S.C.section 360bbb-3(b)(1), unless the authorization is terminated  or revoked sooner.       Influenza A by PCR NEGATIVE NEGATIVE Final   Influenza B by PCR NEGATIVE NEGATIVE Final    Comment: (NOTE) The Xpert Xpress SARS-CoV-2/FLU/RSV plus assay is intended as an aid in the diagnosis of influenza from Nasopharyngeal swab specimens and should not be used as a sole basis for treatment. Nasal washings and aspirates are unacceptable for Xpert Xpress SARS-CoV-2/FLU/RSV testing.  Fact  Sheet for Patients: BloggerCourse.com  Fact Sheet for Healthcare Providers: SeriousBroker.it  This test is not yet approved or cleared by the [place] FDA and has been authorized for detection and/or diagnosis of SARS-CoV-2 by FDA under an Emergency Use Authorization (EUA). This EUA will remain in effect (meaning this test can be used) for the duration of the COVID-19 declaration under Section 564(b)(1) of the Act, 21 U.S.C. section 360bbb-3(b)(1), unless the authorization is terminated or revoked.  Performed at [facility], [address] [place], [place] [address]   Urine Culture     Status: Abnormal   Collection Time: 03/12/21 10:58 PM   Specimen: In/Out Cath Urine  Result Value Ref Range Status   Specimen Description   Final    IN/OUT CATH URINE Performed at [facility], [address] [place], [place] [address]    Special Requests   Final    NONE Performed at [facility], [address], [place], [place] [address]    Culture >=100,000 COLONIES/mL KLEBSIELLA PNEUMONIAE (A)  Final   Report Status 03/15/2021 FINAL  Final   Organism ID, Bacteria KLEBSIELLA PNEUMONIAE (A)  Final      Susceptibility   Klebsiella pneumoniae - MIC*    AMPICILLIN >=32 RESISTANT Resistant     CEFAZOLIN <=4 SENSITIVE Sensitive     CEFEPIME <=0.12 SENSITIVE Sensitive     CEFTRIAXONE <=0.25 SENSITIVE Sensitive     CIPROFLOXACIN 0.5 INTERMEDIATE Intermediate     GENTAMICIN <=1 SENSITIVE Sensitive     IMIPENEM <=0.25 SENSITIVE Sensitive     NITROFURANTOIN 128 RESISTANT Resistant     TRIMETH/SULFA <=20 SENSITIVE Sensitive     AMPICILLIN/SULBACTAM >=32 RESISTANT Resistant     PIP/TAZO 16 SENSITIVE Sensitive     * >=100,000 COLONIES/mL KLEBSIELLA PNEUMONIAE  Culture, blood (Routine X 2) w Reflex to ID Panel     Status: None (Preliminary result)   Collection Time: 03/13/21 12:00 AM   Specimen: Left Antecubital; Blood  Result Value Ref Range  Status   Specimen Description LEFT ANTECUBITAL  Final   Special Requests   Final    BOTTLES DRAWN AEROBIC AND ANAEROBIC Blood Culture adequate volume   Culture   Final    NO GROWTH 3 DAYS Performed at [facility], [address] [place], [place] [address]    Report Status PENDING  Incomplete  Culture, blood (Routine X 2) w Reflex to ID Panel     Status: None (Preliminary result)   Collection Time: 03/13/21  6:54 AM   Specimen: BLOOD RIGHT HAND  Result Value Ref Range Status  Specimen Description BLOOD RIGHT HAND  Final   Special Requests   Final    BOTTLES DRAWN AEROBIC AND ANAEROBIC Blood Culture adequate volume   Culture   Final    NO GROWTH 3 DAYS Performed at [facility], [address], [place], [place] [address]    Report Status PENDING  Incomplete  MRSA Next Gen by PCR, Nasal     Status: Abnormal   Collection Time: 03/13/21  9:11 AM   Specimen: Nasal Mucosa; Nasal Swab  Result Value Ref Range Status   MRSA by PCR Next Gen DETECTED (A) NOT DETECTED Final    Comment: RESULT CALLED TO, READ BACK BY AND VERIFIED WITH: KING J. AT 1237PM ON [id number] BY THOMPSON S. (NOTE) The GeneXpert MRSA Assay (FDA approved for NASAL specimens only), is one component of a comprehensive MRSA colonization surveillance program. It is not intended to diagnose MRSA infection nor to guide or monitor treatment for MRSA infections. Test performance is not FDA approved in patients less than 50 years old. Performed at [facility], [address] [place], [place] [address]      Labs: BNP (last 3 results) Recent Labs    02/07/21 0016  BNP 220.2*   Basic Metabolic Panel: Recent Labs  Lab 03/12/21 2305 03/13/21 0517 03/14/21 0433 03/15/21 0423 03/16/21 0455  NA 137 139 140 139 137  K 4.4 3.8 3.9 3.7 3.8  CL 103 104 110 112* 108  CO2 23 23 18* 19* 18*  GLUCOSE 174* 105* 166* 172* 141*  BUN 26* 26* 24* 17 13  CREATININE 1.35* 1.27* 1.30* 1.10 1.03  CALCIUM 8.7* 8.6* 8.0* 7.9* 8.2*  MG   --  1.6* 1.8 1.5* 1.8  PHOS  --  3.5  --   --  3.0   Liver Function Tests: Recent Labs  Lab 03/12/21 2305 03/13/21 0517 03/16/21 0455  AST 17 15  --   ALT 11 12  --   ALKPHOS 114 115  --   BILITOT 1.0 0.6  --   PROT 7.3 7.3  --   ALBUMIN 2.5* 2.6* 2.2*   No results for input(s): LIPASE, AMYLASE in the last 168 hours. Recent Labs  Lab 03/12/21 2305  AMMONIA <10   CBC: Recent Labs  Lab 03/12/21 2305 03/13/21 0517 03/14/21 0433 03/15/21 0423  WBC 10.1 11.6* 9.6 8.5  NEUTROABS 8.1*  --   --   --   HGB 11.8* 10.7* 8.9* 8.9*  HCT 38.2* 35.7* 31.1* 30.3*  MCV 94.3 94.4 97.8 95.9  PLT 312 296 247 239   Cardiac Enzymes: No results for input(s): CKTOTAL, CKMB, CKMBINDEX, TROPONINI in the last 168 hours. BNP: Invalid input(s): POCBNP CBG: Recent Labs  Lab 03/15/21 1202 03/15/21 1651 03/15/21 2049 03/16/21 0739 03/16/21 1141  GLUCAP 134* 169* 184* 129* 116*   D-Dimer No results for input(s): DDIMER in the last 72 hours. Hgb A1c No results for input(s): HGBA1C in the last 72 hours. Lipid Profile No results for input(s): CHOL, HDL, LDLCALC, TRIG, CHOLHDL, LDLDIRECT in the last 72 hours. Thyroid function studies No results for input(s): TSH, T4TOTAL, T3FREE, THYROIDAB in the last 72 hours.  Invalid input(s): FREET3 Anemia work up No results for input(s): VITAMINB12, FOLATE, FERRITIN, TIBC, IRON, RETICCTPCT in the last 72 hours. Urinalysis    Component Value Date/Time   COLORURINE AMBER (A) 03/12/2021 2258   APPEARANCEUR TURBID (A) 03/12/2021 2258   APPEARANCEUR Cloudy (A) 08/25/2020 1024   LABSPEC 1.012 03/12/2021 2258   PHURINE  6.0 03/12/2021 2258   GLUCOSEU NEGATIVE 03/12/2021 2258   HGBUR SMALL (A) 03/12/2021 2258   BILIRUBINUR NEGATIVE 03/12/2021 2258   BILIRUBINUR Negative 08/25/2020 1024   KETONESUR NEGATIVE 03/12/2021 2258   PROTEINUR 100 (A) 03/12/2021 2258   UROBILINOGEN 0.2 03/09/2020 0958   UROBILINOGEN 0.2 03/21/2014 1840   NITRITE NEGATIVE  03/12/2021 2258   LEUKOCYTESUR LARGE (A) 03/12/2021 2258   Sepsis Labs Invalid input(s): PROCALCITONIN,  WBC,  LACTICIDVEN Microbiology Recent Results (from the past 240 hour(s))  Resp Panel by RT-PCR (Flu A&B, Covid) Nasopharyngeal Swab     Status: None   Collection Time: 03/12/21 10:55 PM   Specimen: Nasopharyngeal Swab; Nasopharyngeal(NP) swabs in vial transport medium  Result Value Ref Range Status   SARS Coronavirus 2 by RT PCR NEGATIVE NEGATIVE Final    Comment: (NOTE) SARS-CoV-2 target nucleic acids are NOT DETECTED.  The SARS-CoV-2 RNA is generally detectable in upper respiratory specimens during the acute phase of infection. The lowest concentration of SARS-CoV-2 viral copies this assay can detect is 138 copies/mL. A negative result does not preclude SARS-Cov-2 infection and should not be used as the sole basis for treatment or other patient management decisions. A negative result may occur with  improper specimen collection/handling, submission of specimen other than nasopharyngeal swab, presence of viral mutation(s) within the areas targeted by this assay, and inadequate number of viral copies(<138 copies/mL). A negative result must be combined with clinical observations, patient history, and epidemiological information. The expected result is Negative.  Fact Sheet for Patients:  EntrepreneurPulse.com.au  Fact Sheet for Healthcare Providers:  IncredibleEmployment.be  This test is no t yet approved or cleared by the [place] FDA and  has been authorized for detection and/or diagnosis of SARS-CoV-2 by FDA under an Emergency Use Authorization (EUA). This EUA will remain  in effect (meaning this test can be used) for the duration of the COVID-19 declaration under Section 564(b)(1) of the Act, 21 U.S.C.section 360bbb-3(b)(1), unless the authorization is terminated  or revoked sooner.       Influenza A by PCR NEGATIVE NEGATIVE  Final   Influenza B by PCR NEGATIVE NEGATIVE Final    Comment: (NOTE) The Xpert Xpress SARS-CoV-2/FLU/RSV plus assay is intended as an aid in the diagnosis of influenza from Nasopharyngeal swab specimens and should not be used as a sole basis for treatment. Nasal washings and aspirates are unacceptable for Xpert Xpress SARS-CoV-2/FLU/RSV testing.  Fact Sheet for Patients: EntrepreneurPulse.com.au  Fact Sheet for Healthcare Providers: IncredibleEmployment.be  This test is not yet approved or cleared by the [place] FDA and has been authorized for detection and/or diagnosis of SARS-CoV-2 by FDA under an Emergency Use Authorization (EUA). This EUA will remain in effect (meaning this test can be used) for the duration of the COVID-19 declaration under Section 564(b)(1) of the Act, 21 U.S.C. section 360bbb-3(b)(1), unless the authorization is terminated or revoked.  Performed at [facility], [address], [place], [place] [address]   Urine Culture     Status: Abnormal   Collection Time: 03/12/21 10:58 PM   Specimen: In/Out Cath Urine  Result Value Ref Range Status   Specimen Description   Final    IN/OUT CATH URINE Performed at [facility], [address], [place], [place] [address]    Special Requests   Final    NONE Performed at [facility], [address] [place], [place] [address]    Culture >=100,000 COLONIES/mL KLEBSIELLA PNEUMONIAE (A)  Final   Report Status 03/15/2021  FINAL  Final   Organism ID, Bacteria KLEBSIELLA PNEUMONIAE (A)  Final      Susceptibility   Klebsiella pneumoniae - MIC*    AMPICILLIN >=32 RESISTANT Resistant     CEFAZOLIN <=4 SENSITIVE Sensitive     CEFEPIME <=0.12 SENSITIVE Sensitive     CEFTRIAXONE <=0.25 SENSITIVE Sensitive     CIPROFLOXACIN 0.5 INTERMEDIATE Intermediate     GENTAMICIN <=1 SENSITIVE Sensitive     IMIPENEM <=0.25 SENSITIVE Sensitive     NITROFURANTOIN 128 RESISTANT Resistant      TRIMETH/SULFA <=20 SENSITIVE Sensitive     AMPICILLIN/SULBACTAM >=32 RESISTANT Resistant     PIP/TAZO 16 SENSITIVE Sensitive     * >=100,000 COLONIES/mL KLEBSIELLA PNEUMONIAE  Culture, blood (Routine X 2) w Reflex to ID Panel     Status: None (Preliminary result)   Collection Time: 03/13/21 12:00 AM   Specimen: Left Antecubital; Blood  Result Value Ref Range Status   Specimen Description LEFT ANTECUBITAL  Final   Special Requests   Final    BOTTLES DRAWN AEROBIC AND ANAEROBIC Blood Culture adequate volume   Culture   Final    NO GROWTH 3 DAYS Performed at [facility], [address], [place], [place] [address]    Report Status PENDING  Incomplete  Culture, blood (Routine X 2) w Reflex to ID Panel     Status: None (Preliminary result)   Collection Time: 03/13/21  6:54 AM   Specimen: BLOOD RIGHT HAND  Result Value Ref Range Status   Specimen Description BLOOD RIGHT HAND  Final   Special Requests   Final    BOTTLES DRAWN AEROBIC AND ANAEROBIC Blood Culture adequate volume   Culture   Final    NO GROWTH 3 DAYS Performed at [facility], [address] [place], [place] [address]    Report Status PENDING  Incomplete  MRSA Next Gen by PCR, Nasal     Status: Abnormal   Collection Time: 03/13/21  9:11 AM   Specimen: Nasal Mucosa; Nasal Swab  Result Value Ref Range Status   MRSA by PCR Next Gen DETECTED (A) NOT DETECTED Final    Comment: RESULT CALLED TO, READ BACK BY AND VERIFIED WITH: KING J. AT 1237PM ON [id number] BY THOMPSON S. (NOTE) The GeneXpert MRSA Assay (FDA approved for NASAL specimens only), is one component of a comprehensive MRSA colonization surveillance program. It is not intended to diagnose MRSA infection nor to guide or monitor treatment for MRSA infections. Test performance is not FDA approved in patients less than 72 years old. Performed at [facility], [address] [place], [place] [address]      Time coordinating discharge: 72mins  SIGNED:   Kathie Dike, MD  [facility] 03/16/2021, 8:57 PM   If 7PM-7AM, please contact night-coverage [email or web address]

## 2021-03-16 NOTE — Progress Notes (Signed)
Foley catheter removed per order. Patient tolerated well. Peri care provided. Male external catheter applied.

## 2021-03-16 NOTE — Care Management Important Message (Signed)
Important Message  Patient Details  Name: Mike Briggs MRN: [id number] Date of Birth: 09/20/1928   Medicare Important Message Given:  Yes     [name] 03/16/2021, 12:41 PM

## 2021-03-16 NOTE — Progress Notes (Signed)
Alert and verbal. Able to make needs known to staff. Pt discharged at this time. Propelled to entrance by staff. Pt in personal vehicle of son of POA. Personal belongings sent with pt. No distress noted at time of departure.

## 2021-03-16 NOTE — Plan of Care (Signed)

## 2021-03-17 ENCOUNTER — Telehealth: Payer: Self-pay | Admitting: *Deleted

## 2021-03-17 NOTE — Telephone Encounter (Signed)
Transition Care Management Follow-up Telephone Call Date of discharge and from where: 03-16-21 [facility]  How have you been since you were released from the hospital? Doing ok  Any questions or concerns? No  Items Reviewed: Did the pt receive and understand the discharge instructions provided? Yes  Medications obtained and verified? Yes  Other? No  Any new allergies since your discharge? No  Dietary orders reviewed? Yes Do you have support at home? Yes   Home Care and Equipment/Supplies: Were home health services ordered? no If so, what is the name of the agency? NA  Has the agency set up a time to come to the patient's home? not applicable Were any new equipment or medical supplies ordered?  No What is the name of the medical supply agency? NA Were you able to get the supplies/equipment? not applicable Do you have any questions related to the use of the equipment or supplies? No  Functional Questionnaire: (I = Independent and D = Dependent) ADLs: D   Bathing/Dressing- D  Meal Prep- D  Eating- D  Maintaining continence- D  Transferring/Ambulation- D  Managing Meds- D  Follow up appointments reviewed:  PCP Hospital f/u appt confirmed? Yes  Scheduled to see 03-21-21 on patel @ 10:00 . [facility] f/u appt confirmed? Yes  Scheduled to see [name]  Are transportation arrangements needed? No  If their condition worsens, is the pt aware to call PCP or go to the Emergency Dept.? Yes Was the patient provided with contact information for the PCP's office or ED? Yes Was to pt encouraged to call back with questions or concerns? Yes

## 2021-03-18 LAB — CULTURE, BLOOD (ROUTINE X 2)
Culture: NO GROWTH
Culture: NO GROWTH
Special Requests: ADEQUATE
Special Requests: ADEQUATE

## 2021-03-21 ENCOUNTER — Other Ambulatory Visit: Payer: Self-pay

## 2021-03-21 ENCOUNTER — Encounter: Payer: Self-pay | Admitting: Internal Medicine

## 2021-03-21 ENCOUNTER — Ambulatory Visit (INDEPENDENT_AMBULATORY_CARE_PROVIDER_SITE_OTHER): Payer: Medicare HMO | Admitting: Internal Medicine

## 2021-03-21 VITALS — BP 102/72 | HR 104 | Resp 16 | Ht 71.0 in

## 2021-03-21 DIAGNOSIS — Z09 Encounter for follow-up examination after completed treatment for conditions other than malignant neoplasm: Secondary | ICD-10-CM | POA: Diagnosis not present

## 2021-03-21 DIAGNOSIS — F01B0 Vascular dementia, moderate, without behavioral disturbance, psychotic disturbance, mood disturbance, and anxiety: Secondary | ICD-10-CM | POA: Diagnosis not present

## 2021-03-21 DIAGNOSIS — D649 Anemia, unspecified: Secondary | ICD-10-CM

## 2021-03-21 DIAGNOSIS — R5381 Other malaise: Secondary | ICD-10-CM

## 2021-03-21 DIAGNOSIS — N390 Urinary tract infection, site not specified: Secondary | ICD-10-CM | POA: Diagnosis not present

## 2021-03-21 NOTE — Progress Notes (Signed)
Established Patient Office Visit  Subjective:  Patient ID: Mike Briggs, male    DOB: 08-May-1928  Age: 88 y.o. MRN: [id number]  CC:  Chief Complaint  Patient presents with   Transitions Of Care    Pt was discharged [facility]-16-21 from [facility] is feeling better     HPI [name] is a 88 y.o. male with past medical history of HTN, DM with CKD stage 3, Mobitz type 1 second degree AV block, dementia and BPH who presents for f/u after recent hospitalization.  He was admitted from 02/12-02/16 for acute metabolic encephalopathy and sepsis secondary to UTI.  His urine culture showed Klebsiella pneumonia, which was treated with IV abx, followed by oral cefadroxil.  His mental status is at baseline now.  He has not had any fever, chills, nausea or vomiting.  His grandson denies any episode of delusion or hallucinations.  Of note, blood tests at hospital showed worsening of his anemia, although he does not have any active signs of bleeding, including hematuria or melena/hematochezia.  Denies any signs of bruising currently.   Past Medical History:  Diagnosis Date   Acute cystitis 12/17/2018   Acute encephalopathy 11/14/2018   Acute metabolic encephalopathy 03/13/2021   ANKLE, ARTHRITIS, DEGEN./OSTEO 12/16/2008   Qualifier: Diagnosis of  By: Romeo Apple MD, [place]     Arthritis    Cataract    Chronic kidney disease    COVID-19    COVID-19 virus detected 11/03/2018   COVID-19 virus infection 11/03/2018   Dehydration 11/02/2018   Depression    Diabetes mellitus    Gram-positive bacteremia 11/03/2018   Hyperlipidemia    Hypertension    Hypomagnesemia 03/24/2019   Klebsiella UTI  03/25/2019   Pain in finger of right hand 05/27/2019   Personal history of gout 05/22/2016   Rhabdomyolysis 11/02/2018   Sepsis secondary to UTI (HCC) 03/13/2021   Stroke (HCC)    Swelling of both hands 05/27/2019   Syncope 02/25/2018   TIA (transient ischemic attack) 11/14/2018   UTI (urinary tract infection)  02/26/2018   Vitamin D deficiency 05/31/2016    Past Surgical History:  Procedure Laterality Date   BACK SURGERY     cervical and lumbar   CATARACT EXTRACTION     CHOLECYSTECTOMY     FOOT SURGERY     5th toe    SPINE SURGERY     lumbar and cervical fusions    Family History  Family history unknown: Yes    Social History   Socioeconomic History   Marital status: Divorced    Spouse name: Not on file   Number of children: 3   Years of education: 10   Highest education level: Not on file  Occupational History   Occupation: Retired.  Tobacco Use   Smoking status: Former    Types: Cigarettes    Quit date: 08/07/1967    Years since quitting: 53.6   Smokeless tobacco: Never  Vaping Use   Vaping Use: Never used  Substance and Sexual Activity   Alcohol use: No   Drug use: No   Sexual activity: Not Currently  Other Topics Concern   Not on file  Social History Narrative   Wheelchair bound.Has not walked in many years, at least five years.  Lives alone. Has 2 aides that come in to help him.   Lives alone.    Retired.   Eats all food groups.    Has 3 children but does not have contact with them.  Divorced.   Social Determinants of Health   Financial Resource Strain: Low Risk    Difficulty of Paying Living Expenses: Not hard at all  Food Insecurity: No Food Insecurity   Worried About Charity fundraiser in the Last Year: Never true   [place] in the Last Year: Never true  Transportation Needs: No Transportation Needs   Lack of Transportation (Medical): No   Lack of Transportation (Non-Medical): No  Physical Activity: Unknown   Days of Exercise per Week: Patient refused   Minutes of Exercise per Session: Not on file  Stress: Stress Concern Present   Feeling of Stress : To some extent  Social Connections: Socially Isolated   Frequency of Communication with Friends and Family: Never   Frequency of Social Gatherings with Friends and Family: Once a week   Attends  Religious Services: Never   Marine scientist or Organizations: No   Attends Music therapist: Never   Marital Status: Divorced  Human resources officer Violence: Not At Risk   Fear of Current or Ex-Partner: No   Emotionally Abused: No   Physically Abused: No   Sexually Abused: No    Outpatient Medications Prior to Visit  Medication Sig Dispense Refill   amLODipine (NORVASC) 10 MG tablet TAKE ONE TABLET BY MOUTH ONCE DAILY 30 tablet 0   aspirin 81 MG chewable tablet Chew 1 tablet (81 mg total) by mouth daily with breakfast. 30 tablet 3   atorvastatin (LIPITOR) 10 MG tablet TAKE 1 TABLET BY MOUTH AFTER SUPPER. 30 tablet 0   blood glucose meter kit and supplies Dispense based on patient and insurance preference. Use up to four times daily as directed. (FOR ICD-10 E10.9, E11.9). 1 each 0   cefadroxil (DURICEF) 500 MG capsule Take 1 capsule (500 mg total) by mouth 2 (two) times daily. 10 capsule 0   LANTUS SOLOSTAR 100 UNIT/ML Solostar Pen Inject 20 Units into the skin daily. 15 mL 3   lisinopril (ZESTRIL) 40 MG tablet TAKE (1) TABLET BY MOUTH ONCE DAILY. 30 tablet 0   memantine (NAMENDA) 10 MG tablet TAKE (1) TABLET BY MOUTH TWICE DAILY. 60 tablet 0   mupirocin cream (BACTROBAN) 2 % Apply 1 application topically 2 (two) times daily. 15 g 0   pantoprazole (PROTONIX) 40 MG tablet Take 1 tablet (40 mg total) by mouth daily. 30 tablet 2   SURE COMFORT PEN NEEDLES 31G X 5 MM MISC USE TO INJECT AS DIRECTED FOUR TIMES A DAY. WITH MEALS AND BEFORE BEDTIME. 100 each 0   tamsulosin (FLOMAX) 0.4 MG CAPS capsule TAKE 1 CAPSULE BY MOUTH DAILY AFTER BREAKFAST. 30 capsule 0   No facility-administered medications prior to visit.    No Known Allergies  ROS Review of Systems  Constitutional:  Negative for chills and fever.  HENT:  Negative for congestion and sore throat.   Eyes:  Negative for pain and discharge.  Respiratory:  Negative for cough and shortness of breath.   Cardiovascular:   Negative for chest pain and palpitations.  Gastrointestinal:  Negative for constipation, diarrhea, nausea and vomiting.  Endocrine: Negative for polydipsia and polyuria.  Genitourinary:  Negative for dysuria and hematuria.  Musculoskeletal:  Positive for arthralgias. Negative for neck pain and neck stiffness.  Skin:  Positive for wound. Negative for rash.  Neurological:  Positive for weakness (B/l LE) and numbness (R foot). Negative for dizziness and headaches.  Psychiatric/Behavioral:  Negative for agitation and behavioral problems.  Objective:    Physical Exam Vitals reviewed.  Constitutional:      General: He is not in acute distress.    Appearance: He is not diaphoretic.  HENT:     Head: Normocephalic and atraumatic.     Nose: Nose normal.     Mouth/Throat:     Mouth: Mucous membranes are moist.  Eyes:     General: No scleral icterus.    Extraocular Movements: Extraocular movements intact.  Cardiovascular:     Rate and Rhythm: Normal rate and regular rhythm.     Heart sounds: Normal heart sounds. No murmur heard. Pulmonary:     Breath sounds: Normal breath sounds. No wheezing or rales.  Abdominal:     Palpations: Abdomen is soft.     Tenderness: There is no abdominal tenderness.  Musculoskeletal:        General: Swelling (R wrist and hand) present.     Cervical back: Neck supple. No tenderness.     Right lower leg: Edema (2+) present.     Left lower leg: Edema (2+) present.  Skin:    General: Skin is warm.     Comments:    Neurological:     General: No focal deficit present.     Mental Status: He is alert and oriented to person, place, and time.     Sensory: Sensory deficit (R foot) present.     Motor: Weakness (RUE - 1/5, B/l LE - 2/5) present.  Psychiatric:        Mood and Affect: Mood normal.        Behavior: Behavior normal.    BP 102/72 (BP Location: Left Arm, Patient Position: Sitting, Cuff Size: Normal)    Pulse (!) 104    Resp 16    Ht $R'5\' 11"'fF$  (1.803  m)    SpO2 95%    BMI 31.46 kg/m  Wt Readings from Last 3 Encounters:  03/15/21 225 lb 8.5 oz (102.3 kg)  02/24/21 230 lb (104.3 kg)  02/07/21 227 lb 15.3 oz (103.4 kg)    Lab Results  Component Value Date   TSH 0.921 03/13/2021   Lab Results  Component Value Date   WBC 8.5 03/15/2021   HGB 8.9 (L) 03/15/2021   HCT 30.3 (L) 03/15/2021   MCV 95.9 03/15/2021   PLT 239 03/15/2021   Lab Results  Component Value Date   NA 137 03/16/2021   K 3.8 03/16/2021   CO2 18 (L) 03/16/2021   GLUCOSE 141 (H) 03/16/2021   BUN 13 03/16/2021   CREATININE 1.03 03/16/2021   BILITOT 0.6 03/13/2021   ALKPHOS 115 03/13/2021   AST 15 03/13/2021   ALT 12 03/13/2021   PROT 7.3 03/13/2021   ALBUMIN 2.2 (L) 03/16/2021   CALCIUM 8.2 (L) 03/16/2021   ANIONGAP 11 03/16/2021   EGFR 41 (L) 12/06/2020   Lab Results  Component Value Date   CHOL 138 12/06/2020   Lab Results  Component Value Date   HDL 37 (L) 12/06/2020   Lab Results  Component Value Date   LDLCALC 83 12/06/2020   Lab Results  Component Value Date   TRIG 93 12/06/2020   Lab Results  Component Value Date   CHOLHDL 3.7 12/06/2020   Lab Results  Component Value Date   HGBA1C 5.8 (H) 03/13/2021      Assessment & Plan:   Problem List Items Addressed This Visit    Hospital discharge follow-up Hospital chart reviewed, including discharge summary We will check CBC  and BMP today Will check urine culture after 1 week  Physical deconditioning He has recent worsening of his muscle weakness Referred to home health for home PT  UTI (urinary tract infection) On oral cefadroxil currently Will check urine culture after 1 week  Vascular dementia ([place]) On Namenda Dependent for ADLs, has home health aide  Other Visit Diagnoses     Anemia, unspecified type  No signs of active bleeding Will check CBC today      Relevant Orders   CBC       No orders of the defined types were placed in this encounter.   Follow-up:  Return if symptoms worsen or fail to improve.    [name], MD

## 2021-03-21 NOTE — Assessment & Plan Note (Signed)
He has recent worsening of his muscle weakness Referred to home health for home PT

## 2021-03-21 NOTE — Patient Instructions (Signed)
Please continue to take medications as prescribed.  Please bring urine sample after 1 week for testing.

## 2021-03-21 NOTE — Assessment & Plan Note (Signed)
On oral cefadroxil currently Will check urine culture after 1 week

## 2021-03-21 NOTE — Assessment & Plan Note (Signed)
On Namenda Dependent for ADLs, has home health aide

## 2021-03-21 NOTE — Assessment & Plan Note (Signed)
Hospital chart reviewed, including discharge summary We will check CBC and BMP today Will check urine culture after 1 week

## 2021-03-22 ENCOUNTER — Telehealth: Payer: Self-pay | Admitting: Internal Medicine

## 2021-03-22 ENCOUNTER — Other Ambulatory Visit: Payer: Self-pay | Admitting: *Deleted

## 2021-03-22 DIAGNOSIS — M545 Low back pain, unspecified: Secondary | ICD-10-CM | POA: Diagnosis not present

## 2021-03-22 DIAGNOSIS — M4727 Other spondylosis with radiculopathy, lumbosacral region: Secondary | ICD-10-CM | POA: Diagnosis not present

## 2021-03-22 DIAGNOSIS — R5381 Other malaise: Secondary | ICD-10-CM

## 2021-03-22 DIAGNOSIS — I69351 Hemiplegia and hemiparesis following cerebral infarction affecting right dominant side: Secondary | ICD-10-CM

## 2021-03-22 DIAGNOSIS — M542 Cervicalgia: Secondary | ICD-10-CM | POA: Diagnosis not present

## 2021-03-22 LAB — CBC
Hematocrit: 31.6 % — ABNORMAL LOW (ref 37.5–51.0)
Hemoglobin: 10.1 g/dL — ABNORMAL LOW (ref 13.0–17.7)
MCH: 27.8 pg (ref 26.6–33.0)
MCHC: 32 g/dL (ref 31.5–35.7)
MCV: 87 fL (ref 79–97)
Platelets: 312 10*3/uL (ref 150–450)
RBC: 3.63 x10E6/uL — ABNORMAL LOW (ref 4.14–5.80)
RDW: 13.1 % (ref 11.6–15.4)
WBC: 12 10*3/uL — ABNORMAL HIGH (ref 3.4–10.8)

## 2021-03-22 LAB — BASIC METABOLIC PANEL
BUN/Creatinine Ratio: 12 (ref 10–24)
BUN: 14 mg/dL (ref 10–36)
CO2: 21 mmol/L (ref 20–29)
Calcium: 8.6 mg/dL (ref 8.6–10.2)
Chloride: 105 mmol/L (ref 96–106)
Creatinine, Ser: 1.16 mg/dL (ref 0.76–1.27)
Glucose: 130 mg/dL — ABNORMAL HIGH (ref 70–99)
Potassium: 4 mmol/L (ref 3.5–5.2)
Sodium: 140 mmol/L (ref 134–144)
eGFR: 60 mL/min/{1.73_m2} (ref >59)

## 2021-03-23 ENCOUNTER — Other Ambulatory Visit: Payer: Self-pay

## 2021-03-23 ENCOUNTER — Ambulatory Visit (INDEPENDENT_AMBULATORY_CARE_PROVIDER_SITE_OTHER): Payer: Medicare HMO | Admitting: Urology

## 2021-03-23 ENCOUNTER — Telehealth: Payer: Self-pay | Admitting: *Deleted

## 2021-03-23 DIAGNOSIS — N401 Enlarged prostate with lower urinary tract symptoms: Secondary | ICD-10-CM | POA: Diagnosis not present

## 2021-03-23 DIAGNOSIS — N138 Other obstructive and reflux uropathy: Secondary | ICD-10-CM

## 2021-03-23 DIAGNOSIS — R339 Retention of urine, unspecified: Secondary | ICD-10-CM

## 2021-03-23 DIAGNOSIS — R3912 Poor urinary stream: Secondary | ICD-10-CM

## 2021-03-23 DIAGNOSIS — Z7689 Persons encountering health services in other specified circumstances: Secondary | ICD-10-CM | POA: Diagnosis not present

## 2021-03-23 DIAGNOSIS — N3020 Other chronic cystitis without hematuria: Secondary | ICD-10-CM | POA: Diagnosis not present

## 2021-03-23 LAB — URINALYSIS, ROUTINE W REFLEX MICROSCOPIC
Bilirubin, UA: NEGATIVE
Glucose, UA: NEGATIVE
Ketones, UA: NEGATIVE
Nitrite, UA: NEGATIVE
Specific Gravity, UA: 1.02 (ref 1.005–1.030)
Urobilinogen, Ur: 0.2 mg/dL (ref 0.2–1.0)
pH, UA: 5.5 (ref 5.0–7.5)

## 2021-03-23 LAB — MICROSCOPIC EXAMINATION
Epithelial Cells (non renal): >10 /hpf — AB (ref 0–10)
Renal Epithel, UA: NONE SEEN /hpf

## 2021-03-23 NOTE — Progress Notes (Signed)
Subjective: 1. Chronic cystitis   2. Incomplete bladder emptying   3. BPH with urinary obstruction   4. Weak urinary stream     03/23/21: Mike Briggs returns today in f/u for history of UTI's and UUI with BPH with BOO on tamsulosin and finasteride.  He is no longer on the finasteride.  He finished Duricef for a Klebsiella UTI yesterday but the cath UA still has some pyuria, microhematuria and bacteria.   He denies incontinence and feels he is voiding well. His Cr was 1.16 at last check in the hospital on 03/21/21.   08/25/20: Mike Briggs returns today in f/u.  He had an e. Coli infection in 2/22 and was treated with bactrim. His Cr was 1.97 in 1/22.  Today he is reporting dysuria and urgency with UUI and also has burning with a BM.   He has a PVR of 158ml and a cath UA was purulent.   He remains on finasteride and tamsulosin.    01/15/20: Mike Briggs returns today in f/u.  He was placed back on keflex suppression after his last visit for a recurrent UTI but has not been on it for about 2 months. .  He remains on finasteride and tamsulosin.   He had a CT AP on 09/16/19.   His UA today still looks infected and his PVR remains 329ml.   He has dysuria but no hematuria and he doesn't wake too much at night.   09/11/19:Mike Briggs returns today in f/u he has a history of BPH with bOO and has been on tamsulosin.  Finasteride was added at his last visit but he has run out.  He was found to have Klebsiella at his last visit and was given keflex for therapy and now suppression with $RemoveBeforeDE'250mg'vGqJyAMrnTsrMIU$  nightly.   He continues to have cloudy urine and dysuria.  He has had no hematuria.  His PVR today is 139ml and his IPSS is 29 but he feels like he is voiding ok.  A subsequent cath PVR was 311ml and the urine was cloudy.   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   He had a scrotal US in 2018 that confirmed hydroceles.  He has also had recurrent UTI's with cloudy white  urine.  He has some dysuria.  He has some urgency.  He has minimal nocturia but can have enuresis.  He has a weak stream and a sensation of incomplete emptying.  He has been constipation and started a stool softener.  He has had no hematuria.  He has no recollection of any GU surgery.  He has had no stones.   He has been hospitalized for UTI's with the most recent trip to the ER on 06/05/19.  The culture grew citrobacter. He had klebsiella on 03/23/19, 12/17/18, 02/25/18 and 10/08/17.  When the urine starts he can't stop it.  He does have some sputtering of the stream.   He had a CT in 2014 and the bladder was noted to be distented.   ROS:  ROS  No Known Allergies  Past Medical History:  Diagnosis Date   Acute cystitis 12/17/2018   Acute encephalopathy 10/93/2355   Acute metabolic encephalopathy 7/32/2025   ANKLE, ARTHRITIS, DEGEN./OSTEO 12/16/2008   Qualifier: Diagnosis of  By: Aline Brochure MD, Dorothyann Peng     Arthritis    Cataract    Chronic kidney disease    COVID-19    COVID-19 virus  detected 11/03/2018   COVID-19 virus infection 11/03/2018   Dehydration 11/02/2018   Depression    Diabetes mellitus    Gram-positive bacteremia 11/03/2018   Hyperlipidemia    Hypertension    Hypomagnesemia 03/24/2019   Klebsiella UTI  03/25/2019   Pain in finger of right hand 05/27/2019   Personal history of gout 05/22/2016   Rhabdomyolysis 11/02/2018   Sepsis secondary to UTI ([place]) 03/13/2021   Stroke ([place])    Swelling of both hands 05/27/2019   Syncope 02/25/2018   TIA (transient ischemic attack) 11/14/2018   UTI (urinary tract infection) 02/26/2018   Vitamin D deficiency 05/31/2016    Past Surgical History:  Procedure Laterality Date   BACK SURGERY     cervical and lumbar   CATARACT EXTRACTION     CHOLECYSTECTOMY     FOOT SURGERY     5th toe    SPINE SURGERY     lumbar and cervical fusions    Social History   Socioeconomic History   Marital status: Divorced    Spouse name: Not on file   Number of  children: 3   Years of education: 10   Highest education level: Not on file  Occupational History   Occupation: Retired.  Tobacco Use   Smoking status: Former    Types: Cigarettes    Quit date: 08/07/1967    Years since quitting: 53.6   Smokeless tobacco: Never  Vaping Use   Vaping Use: Never used  Substance and Sexual Activity   Alcohol use: No   Drug use: No   Sexual activity: Not Currently  Other Topics Concern   Not on file  Social History Narrative   Wheelchair bound.Has not walked in many years, at least five years.  Lives alone. Has 2 aides that come in to help him.   Lives alone.    Retired.   Eats all food groups.    Has 3 children but does not have contact with them.   Divorced.   Social Determinants of Health   Financial Resource Strain: Low Risk    Difficulty of Paying Living Expenses: Not hard at all  Food Insecurity: No Food Insecurity   Worried About Charity fundraiser in the Last Year: Never true   Liberty in the Last Year: Never true  Transportation Needs: No Transportation Needs   Lack of Transportation (Medical): No   Lack of Transportation (Non-Medical): No  Physical Activity: Unknown   Days of Exercise per Week: Patient refused   Minutes of Exercise per Session: Not on file  Stress: Stress Concern Present   Feeling of Stress : To some extent  Social Connections: Socially Isolated   Frequency of Communication with Friends and Family: Never   Frequency of Social Gatherings with Friends and Family: Once a week   Attends Religious Services: Never   Marine scientist or Organizations: No   Attends Music therapist: Never   Marital Status: Divorced  Human resources officer Violence: Not At Risk   Fear of Current or Ex-Partner: No   Emotionally Abused: No   Physically Abused: No   Sexually Abused: No    Family History  Family history unknown: Yes    Anti-infectives: Anti-infectives (From admission, onward)    None        Current Outpatient Medications  Medication Sig Dispense Refill   amLODipine (NORVASC) 10 MG tablet TAKE ONE TABLET BY MOUTH ONCE DAILY 30 tablet 0   aspirin 81  MG chewable tablet Chew 1 tablet (81 mg total) by mouth daily with breakfast. 30 tablet 3   atorvastatin (LIPITOR) 10 MG tablet TAKE 1 TABLET BY MOUTH AFTER SUPPER. 30 tablet 0   blood glucose meter kit and supplies Dispense based on patient and insurance preference. Use up to four times daily as directed. (FOR ICD-10 E10.9, E11.9). 1 each 0   cefadroxil (DURICEF) 500 MG capsule Take 1 capsule (500 mg total) by mouth 2 (two) times daily. 10 capsule 0   LANTUS SOLOSTAR 100 UNIT/ML Solostar Pen Inject 20 Units into the skin daily. 15 mL 3   lisinopril (ZESTRIL) 40 MG tablet TAKE (1) TABLET BY MOUTH ONCE DAILY. 30 tablet 0   memantine (NAMENDA) 10 MG tablet TAKE (1) TABLET BY MOUTH TWICE DAILY. 60 tablet 0   mupirocin cream (BACTROBAN) 2 % Apply 1 application topically 2 (two) times daily. 15 g 0   pantoprazole (PROTONIX) 40 MG tablet Take 1 tablet (40 mg total) by mouth daily. 30 tablet 2   SURE COMFORT PEN NEEDLES 31G X 5 MM MISC USE TO INJECT AS DIRECTED FOUR TIMES A DAY. WITH MEALS AND BEFORE BEDTIME. 100 each 0   tamsulosin (FLOMAX) 0.4 MG CAPS capsule TAKE 1 CAPSULE BY MOUTH DAILY AFTER BREAKFAST. 30 capsule 0   No current facility-administered medications for this visit.     Objective: Vital signs in last 24 hours: There were no vitals taken for this visit.  Intake/Output from previous day: No intake/output data recorded. Intake/Output this shift: $RemoveBef'@IOTHISSHIFT'nEWgYPNQsO$ @   Physical Exam  Lab Results:  Results for orders placed or performed in visit on 03/23/21 (from the past 24 hour(s))  Urinalysis, Routine w reflex microscopic     Status: Abnormal   Collection Time: 03/23/21  2:27 PM  Result Value Ref Range   Specific Gravity, UA 1.020 1.005 - 1.030   pH, UA 5.5 5.0 - 7.5   Color, UA Amber (A) Yellow   Appearance Ur  Hazy (A) Clear   Leukocytes,UA Trace (A) Negative   Protein,UA 3+ (A) Negative/Trace   Glucose, UA Negative Negative   Ketones, UA Negative Negative   RBC, UA 1+ (A) Negative   Bilirubin, UA Negative Negative   Urobilinogen, Ur 0.2 0.2 - 1.0 mg/dL   Nitrite, UA Negative Negative   Microscopic Examination See below:    Narrative   Performed at:  [facility] [address] [place], [place]  [id number] Lab Director: Mina Marble MT, Phone:  [phone]  Microscopic Examination     Status: Abnormal   Collection Time: 03/23/21  2:27 PM   Urine  Result Value Ref Range   WBC, UA 6-10 (A) 0 - 5 /hpf   RBC 3-10 (A) 0 - 2 /hpf   Epithelial Cells (non renal) >10 (A) 0 - 10 /hpf   Renal Epithel, UA None seen None seen /hpf   Mucus, UA Present Not Estab.   Bacteria, UA Moderate (A) None seen/Few   Narrative   Performed at:  [facility] [address], [place], [place]  [id number] Lab Director: Mina Marble MT, Phone:  [phone]      BMET Recent Labs    03/21/21 1031  NA 140  K 4.0  CL 105  CO2 21  GLUCOSE 130*  BUN 14  CREATININE 1.16  CALCIUM 8.6   PT/INR No results for input(s): LABPROT, INR in the last 72 hours. ABG No results for input(s): PHART, HCO3 in the last  72 hours.  Invalid input(s): PCO2, PO2  Studies/Results: PVR reviewed.   Assessment/Plan: BPH with BOO . He will remain on tamsulosin but is off of the finasteride.  Chronic cystitis.  He just completed treatment for a UTI but his UA remains abnormal today.  Urine culture sent.    No orders of the defined types were placed in this encounter.     Orders Placed This Encounter  Procedures   Urine Culture   Microscopic Examination   Urinalysis, Routine w reflex microscopic     Return in about 6 months (around 09/20/2021).    CC: Cherly Beach NP    Irine Seal 03/23/2021 [phone]

## 2021-03-23 NOTE — Chronic Care Management (AMB) (Signed)
°  Chronic Care Management   Outreach Note  03/23/2021 Name: Mike Briggs MRN: [id number] DOB: 08-24-1928  Mike Briggs is a 88 y.o. year old male who is a primary care patient of [name], MD. I reached out to [name] by phone today in response to a referral sent by [name] [facility] primary care provider.  An unsuccessful telephone outreach was attempted today. The patient was referred to the case management team for assistance with care management and care coordination.   Follow Up Plan: The care management team will reach out to the patient again over the next 7 days.  If patient returns call to provider office, please advise to call [place]* at [phone].*  [place] Management  Direct Dial: [phone]

## 2021-03-23 NOTE — Chronic Care Management (AMB) (Signed)
°  Chronic Care Management   Outreach Note  03/23/2021 Name: Mike Briggs MRN: [id number] DOB: 11-07-1928  Mike Briggs is a 88 y.o. year old male who is a primary care patient of [name], MD. I reached out to [name] by phone today in response to a referral sent by Mr. Antion Andres [facility] primary care provider.  An unsuccessful telephone outreach was attempted today. The patient was referred to the case management team for assistance with care management and care coordination.   Follow Up Plan: The care management team will reach out to the patient again over the next 1 days.  If patient returns call to provider office, please advise to call [place] * at [phone].*  [place] Management  Direct Dial: [phone]

## 2021-03-23 NOTE — Progress Notes (Signed)
Pt is prepped for and in and out catherization. Patient was cleaned and prepped in a sterle fashion with betadine. A 14 fr catheter foley was inserted. Urine return was note 325 ml.  Performed by Lurline Hare   Urine sent for ua

## 2021-03-25 LAB — URINE CULTURE: Organism ID, Bacteria: NO GROWTH

## 2021-03-27 ENCOUNTER — Telehealth: Payer: Self-pay

## 2021-03-27 NOTE — Chronic Care Management (AMB) (Signed)
Chronic Care Management   Note  03/27/2021 Name: Mike Briggs MRN: [id number] DOB: 23-Jul-1928  Mike Briggs is a 88 y.o. year old male who is a primary care patient of [name], MD. I reached out to [name] by phone today in response to a referral sent by Mike Briggs [facility] PCP.  Mike Briggs was given information about Chronic Care Management services today including:  CCM service includes personalized support from designated clinical staff supervised by his physician, including individualized plan of care and coordination with other care providers 24/7 contact phone numbers for assistance for urgent and routine care needs. Service will only be billed when office clinical staff spend 20 minutes or more in a month to coordinate care. Only one practitioner may furnish and bill the service in a calendar month. The patient may stop CCM services at any time (effective at the end of the month) by phone call to the office staff. The patient is responsible for co-pay (up to 20% after annual deductible is met) if co-pay is required by the individual health plan.   Niece POA [place], [name]   verbally agreed to assistance and services provided by embedded care coordination/care management team today.  Follow up plan: Telephone appointment with care management team member scheduled for:SW 03/28/21 and [facility] 03/30/21  [place] Management  Direct Dial: [phone]

## 2021-03-27 NOTE — Telephone Encounter (Signed)
Spoke with patient care giver- notified of urine culture results. Voiced understanding,

## 2021-03-27 NOTE — Telephone Encounter (Signed)
-----   Message from Irine Seal, MD sent at 03/27/2021 10:03 AM EST ----- Culture is negative.  ----- Message ----- From: Dorisann Frames, RN Sent: 03/26/2021   7:36 PM EST To: Irine Seal, MD  Please review

## 2021-03-28 ENCOUNTER — Other Ambulatory Visit: Payer: Self-pay | Admitting: Urology

## 2021-03-28 ENCOUNTER — Other Ambulatory Visit: Payer: Self-pay | Admitting: Internal Medicine

## 2021-03-28 ENCOUNTER — Ambulatory Visit: Payer: Medicare HMO

## 2021-03-28 ENCOUNTER — Ambulatory Visit (INDEPENDENT_AMBULATORY_CARE_PROVIDER_SITE_OTHER): Payer: Medicare HMO

## 2021-03-28 DIAGNOSIS — N183 Chronic kidney disease, stage 3 unspecified: Secondary | ICD-10-CM

## 2021-03-28 DIAGNOSIS — D649 Anemia, unspecified: Secondary | ICD-10-CM

## 2021-03-28 DIAGNOSIS — M7989 Other specified soft tissue disorders: Secondary | ICD-10-CM

## 2021-03-28 DIAGNOSIS — F01B0 Vascular dementia, moderate, without behavioral disturbance, psychotic disturbance, mood disturbance, and anxiety: Secondary | ICD-10-CM

## 2021-03-28 DIAGNOSIS — N390 Urinary tract infection, site not specified: Secondary | ICD-10-CM

## 2021-03-28 DIAGNOSIS — R339 Retention of urine, unspecified: Secondary | ICD-10-CM

## 2021-03-28 DIAGNOSIS — I10 Essential (primary) hypertension: Secondary | ICD-10-CM

## 2021-03-28 DIAGNOSIS — E1122 Type 2 diabetes mellitus with diabetic chronic kidney disease: Secondary | ICD-10-CM

## 2021-03-28 NOTE — Patient Instructions (Addendum)
Visit Information  Patient Goals:  Coping Skills Enhanced. Complete ADLs daily as able  Time Frame:  Short Term Goal Priority: Medium Progress:  On Track  Start Date:  03/28/21  End Date:  06/19/21  Follow Up Date:  04/28/21 at 3:00 PM  Coping Skills Enhanced. Complete ADLs daily as able  Patient Coping Skills:  Has strong family support Takes medications as prescribed. Has in home support with home health aides daily   Patient Deficits:  Mobility challenges Needs help with ADLs  Patient Goals:  Attend scheduled medical appointments in next 30 days Discuss his needs regularly with POA, Mike Briggs  Follow Up Plan: LCSW to call client or POA, Mike Briggs on 04/28/21 at 3:00 PM to assess client needs   Norva Riffle.Berthel Bagnall MSW, [place] representative [facility] Care Management [phone]

## 2021-03-28 NOTE — Chronic Care Management (AMB) (Signed)
Chronic Care Management    Clinical Social Work Note  03/28/2021 Name: Mike Briggs MRN: [id number] DOB: [birth year]  Mike Briggs is a 88 y.o. year old male who is a primary care patient of [name], MD. The CCM team was consulted to assist the patient with chronic disease management and/or care coordination needs related to: [employer] .   Engaged with patient / niece, POA of client, [name], by telephone for initial visit in response to provider referral for social work chronic care management and care coordination services.   Consent to Services:  The patient was given the following information about Chronic Care Management services today, agreed to services, and gave verbal consent: 1. CCM service includes personalized support from designated clinical staff supervised by the primary care provider, including individualized plan of care and coordination with other care providers 2. 24/7 contact phone numbers for assistance for urgent and routine care needs. 3. Service will only be billed when office clinical staff spend 20 minutes or more in a month to coordinate care. 4. Only one practitioner may furnish and bill the service in a calendar month. 5.The patient may stop CCM services at any time (effective at the end of the month) by phone call to the office staff. 6. The patient will be responsible for cost sharing (co-pay) of up to 20% of the service fee (after annual deductible is met). Patient agreed to services and consent obtained.  Patient agreed to services and consent obtained.   Assessment: Review of patient past medical history, allergies, medications, and health status, including review of relevant consultants reports was performed today as part of a comprehensive evaluation and provision of chronic care management and care coordination services.     SDOH (Social Determinants of Health) assessments and interventions performed:  SDOH Interventions     Flowsheet Row Most Recent Value  SDOH Interventions   Physical Activity Interventions Other (Comments)  [client cannot walk, He uses a electric wheelchair for ambulation]  Stress Interventions Other (Comment)  [client has some stress related to managing medical needs]        Advanced Directives Status: See Vynca application for related entries.  CCM Care Plan  No Known Allergies  Outpatient Encounter Medications as of 03/28/2021  Medication Sig Note   amLODipine (NORVASC) 10 MG tablet TAKE ONE TABLET BY MOUTH ONCE DAILY    aspirin 81 MG chewable tablet Chew 1 tablet (81 mg total) by mouth daily with breakfast.    atorvastatin (LIPITOR) 10 MG tablet TAKE 1 TABLET BY MOUTH AFTER SUPPER.    blood glucose meter kit and supplies Dispense based on patient and insurance preference. Use up to four times daily as directed. (FOR ICD-10 E10.9, E11.9).    cefadroxil (DURICEF) 500 MG capsule Take 1 capsule (500 mg total) by mouth 2 (two) times daily.    LANTUS SOLOSTAR 100 UNIT/ML Solostar Pen Inject 20 Units into the skin daily. 03/13/2021: LF:01/18/2021 DS:75    lisinopril (ZESTRIL) 40 MG tablet TAKE (1) TABLET BY MOUTH ONCE DAILY. 03/13/2021: LF: 01/19/2021 DS:30    memantine (NAMENDA) 10 MG tablet TAKE (1) TABLET BY MOUTH TWICE DAILY.    mupirocin cream (BACTROBAN) 2 % Apply 1 application topically 2 (two) times daily.    pantoprazole (PROTONIX) 40 MG tablet Take 1 tablet (40 mg total) by mouth daily. 03/13/2021: LF: 02/22/2021 DS:30    SURE COMFORT PEN NEEDLES 31G X 5 MM MISC USE TO INJECT AS DIRECTED FOUR TIMES A DAY.  WITH MEALS AND BEFORE BEDTIME.    tamsulosin (FLOMAX) 0.4 MG CAPS capsule TAKE 1 CAPSULE BY MOUTH DAILY AFTER BREAKFAST. 03/13/2021: LF: 02/23/2021 DS:30    No facility-administered encounter medications on file as of 03/28/2021.    Patient Active Problem List   Diagnosis Date Noted   Hospital discharge follow-up 03/21/2021   Physical deconditioning 03/14/2021   UTI (urinary  tract infection) 03/13/2021   Dementia without behavioral disturbance ([place]) 03/13/2021   Chronic kidney disease, stage 3b ([place]) 03/13/2021   Encounter for examination following treatment at hospital 03/08/2021   Dysphagia    Gastroesophageal reflux disease    Chest pain 01/26/2021   Foot ulcer ([place]) 11/24/2020   PVD (peripheral vascular disease) ([place]) 11/24/2020   Dark urine 03/08/2020   Polyarthritis 12/10/2019   Hydrocele 05/27/2019   Hypomagnesemia 03/24/2019   Mobitz type 1 second degree atrioventricular block    Sacral decubitus ulcer, stage III ([place]) 11/11/2018   Pressure injury of skin 11/08/2018   Essential hypertension 05/22/2016   Hemiparesis affecting right side as late effect of cerebrovascular accident (CVA) ([place]) 05/22/2016   BPH (benign prostatic hyperplasia) 05/22/2016   Chronic constipation 05/22/2016   Hyperlipidemia 05/22/2016   Pseudophakia 12/06/2015   Vascular dementia ([place]) 10/23/2012   Type 2 diabetes mellitus with diabetic chronic kidney disease ([place]) 12/16/2008    Conditions to be addressed/monitored: monitor client completion of ADLs  Care Plan : LCSW care plan  Updates made by Katha Cabal, LCSW since 03/28/2021 12:00 AM     Problem: Coping Skills (General Plan of Care)      Goal: Coping Skills Enhanced. Complete ADLs as able   Start Date: 03/28/2021  Expected End Date: 06/19/2021  This Visit's Progress: On track  Priority: Medium     Current barriers:   Patient in need of assistance with connecting to community resources for ADLs support Acknowledges deficits with meeting this unmet need Mobility. He uses motorized wheelchair Appetite is decreased occaionally  Clinical Goals:  Client or POA will talk with SW monthly to discuss coping skills of client and his daily needs Client or POA to talk with SW monthly about in home support of client Client to attend scheduled medical appointments  Clinical Interventions:  Collaboration with  [name], MD regarding development and update of comprehensive plan of care as evidenced by provider attestation and co-signature Assessment of needs, barriers of client Discussed client needs with [name] , niece and POA for client Reviewed sleeping issues of client. Reviewed appetite of client , Reviewed pain issues of client Reviewed family support for client. Niece, Gregori Abril, [place], is supportive. Wilburt Messina is also supportive Discussed [facility] transport support for client Discussed in home support for client. Client has home health aide support for client scheduled daily for 7 days per week Discussed relaxation techniques (likes Blues music, plays checkers) Discussed insurance coverage for client.  Client has [facility] Medicare coverage and he has Medicaid coverage Reviewed client history of UTIs. [name] said client has frequent UTIs. Reviewed ambulation of client. [name] said client uses motorized wheelchair daily to ambulate.  She said he goes from bed when he gets up to motorized wheelchair and uses motorized wheelchair as needed in the day Informed [name] of support for client with CCM program with [place] to call RNCM Jacqlyn Larsen as needed for CCM nursing support for client  Patient Coping Skills:  Has strong family support Takes medications as prescribed. Has in home support with home health  aides daily   Patient Deficits:  Mobility challenges Needs help with ADLs  Patient Goals:  Attend scheduled medical appointments in next 30 days Discuss his needs regularly with POA, [name]  Follow Up Plan: LCSW to call client or POA, Talbert Trembath on 04/28/21 at 3:00 PM to assess client needs     Norva Riffle.Lenora Gomes MSW, [place] representative [facility] Care Management [phone]

## 2021-03-30 ENCOUNTER — Telehealth: Payer: Self-pay | Admitting: *Deleted

## 2021-03-30 ENCOUNTER — Telehealth: Payer: Medicare HMO

## 2021-03-30 NOTE — Telephone Encounter (Signed)
?  Care Management  ? ?Follow Up Note ? ? ?03/30/2021 ?Name: TILER BRANDIS MRN: [id number] DOB: [birth year] ? ? ?Referred by: [name], MD ?Reason for referral : Chronic Care Management (DM2, HTN) ? ? ?An unsuccessful telephone outreach was attempted today. The patient was referred to the case management team for assistance with care management and care coordination.  ? ?Follow Up Plan: Telephone follow up appointment with care management team member scheduled for:  upon care guide rescheduling. ? ?Jacqlyn Larsen RNC, BSN ?RN Case Manager ?[facility] ?[phone] ? ? ?

## 2021-04-01 ENCOUNTER — Other Ambulatory Visit: Payer: Self-pay

## 2021-04-01 ENCOUNTER — Encounter (HOSPITAL_COMMUNITY): Payer: Self-pay

## 2021-04-01 ENCOUNTER — Emergency Department (HOSPITAL_COMMUNITY): Payer: Medicare HMO

## 2021-04-01 ENCOUNTER — Inpatient Hospital Stay (HOSPITAL_COMMUNITY)
Admission: EM | Admit: 2021-04-01 | Discharge: 2021-04-04 | DRG: 689 | Disposition: A | Discharge status: Home Health | Payer: Medicare HMO | Attending: Internal Medicine | Admitting: Internal Medicine

## 2021-04-01 DIAGNOSIS — L89312 Pressure ulcer of right buttock, stage 2: Secondary | ICD-10-CM | POA: Diagnosis present

## 2021-04-01 DIAGNOSIS — I129 Hypertensive chronic kidney disease with stage 1 through stage 4 chronic kidney disease, or unspecified chronic kidney disease: Secondary | ICD-10-CM | POA: Diagnosis not present

## 2021-04-01 DIAGNOSIS — Z794 Long term (current) use of insulin: Secondary | ICD-10-CM

## 2021-04-01 DIAGNOSIS — R9431 Abnormal electrocardiogram [ECG] [EKG]: Secondary | ICD-10-CM | POA: Diagnosis not present

## 2021-04-01 DIAGNOSIS — L89322 Pressure ulcer of left buttock, stage 2: Secondary | ICD-10-CM | POA: Diagnosis not present

## 2021-04-01 DIAGNOSIS — N179 Acute kidney failure, unspecified: Secondary | ICD-10-CM | POA: Diagnosis not present

## 2021-04-01 DIAGNOSIS — Z8616 Personal history of COVID-19: Secondary | ICD-10-CM | POA: Diagnosis not present

## 2021-04-01 DIAGNOSIS — E669 Obesity, unspecified: Secondary | ICD-10-CM | POA: Diagnosis present

## 2021-04-01 DIAGNOSIS — G9341 Metabolic encephalopathy: Secondary | ICD-10-CM | POA: Diagnosis present

## 2021-04-01 DIAGNOSIS — I10 Essential (primary) hypertension: Secondary | ICD-10-CM | POA: Diagnosis not present

## 2021-04-01 DIAGNOSIS — N400 Enlarged prostate without lower urinary tract symptoms: Secondary | ICD-10-CM | POA: Diagnosis not present

## 2021-04-01 DIAGNOSIS — G9340 Encephalopathy, unspecified: Secondary | ICD-10-CM | POA: Diagnosis not present

## 2021-04-01 DIAGNOSIS — N3001 Acute cystitis with hematuria: Principal | ICD-10-CM | POA: Diagnosis present

## 2021-04-01 DIAGNOSIS — F0150 Vascular dementia without behavioral disturbance: Secondary | ICD-10-CM | POA: Diagnosis not present

## 2021-04-01 DIAGNOSIS — K219 Gastro-esophageal reflux disease without esophagitis: Secondary | ICD-10-CM | POA: Diagnosis present

## 2021-04-01 DIAGNOSIS — E785 Hyperlipidemia, unspecified: Secondary | ICD-10-CM | POA: Diagnosis not present

## 2021-04-01 DIAGNOSIS — W19XXXA Unspecified fall, initial encounter: Secondary | ICD-10-CM | POA: Diagnosis not present

## 2021-04-01 DIAGNOSIS — R5381 Other malaise: Secondary | ICD-10-CM | POA: Diagnosis present

## 2021-04-01 DIAGNOSIS — Z79899 Other long term (current) drug therapy: Secondary | ICD-10-CM | POA: Diagnosis not present

## 2021-04-01 DIAGNOSIS — E1122 Type 2 diabetes mellitus with diabetic chronic kidney disease: Secondary | ICD-10-CM | POA: Diagnosis present

## 2021-04-01 DIAGNOSIS — N1832 Chronic kidney disease, stage 3b: Secondary | ICD-10-CM | POA: Diagnosis not present

## 2021-04-01 DIAGNOSIS — Z87891 Personal history of nicotine dependence: Secondary | ICD-10-CM

## 2021-04-01 DIAGNOSIS — R4781 Slurred speech: Secondary | ICD-10-CM | POA: Diagnosis not present

## 2021-04-01 DIAGNOSIS — Z6831 Body mass index (BMI) 31.0-31.9, adult: Secondary | ICD-10-CM

## 2021-04-01 DIAGNOSIS — R29818 Other symptoms and signs involving the nervous system: Secondary | ICD-10-CM | POA: Diagnosis not present

## 2021-04-01 DIAGNOSIS — Z7982 Long term (current) use of aspirin: Secondary | ICD-10-CM

## 2021-04-01 DIAGNOSIS — L89302 Pressure ulcer of unspecified buttock, stage 2: Secondary | ICD-10-CM | POA: Diagnosis not present

## 2021-04-01 DIAGNOSIS — N390 Urinary tract infection, site not specified: Secondary | ICD-10-CM | POA: Diagnosis present

## 2021-04-01 DIAGNOSIS — R531 Weakness: Secondary | ICD-10-CM | POA: Diagnosis not present

## 2021-04-01 DIAGNOSIS — Z7401 Bed confinement status: Secondary | ICD-10-CM | POA: Diagnosis not present

## 2021-04-01 DIAGNOSIS — L8990 Pressure ulcer of unspecified site, unspecified stage: Secondary | ICD-10-CM | POA: Diagnosis present

## 2021-04-01 LAB — URINALYSIS, ROUTINE W REFLEX MICROSCOPIC
Bilirubin Urine: NEGATIVE
Glucose, UA: NEGATIVE mg/dL
Ketones, ur: NEGATIVE mg/dL
Nitrite: NEGATIVE
Protein, ur: 100 mg/dL — AB
RBC / HPF: >50 RBC/hpf — ABNORMAL HIGH (ref 0–5)
Specific Gravity, Urine: 1.011 (ref 1.005–1.030)
pH: 7 (ref 5.0–8.0)

## 2021-04-01 LAB — COMPREHENSIVE METABOLIC PANEL
ALT: 11 U/L (ref 0–44)
AST: 15 U/L (ref 15–41)
Albumin: 2.5 g/dL — ABNORMAL LOW (ref 3.5–5.0)
Alkaline Phosphatase: 116 U/L (ref 38–126)
Anion gap: 10 (ref 5–15)
BUN: 20 mg/dL (ref 8–23)
CO2: 24 mmol/L (ref 22–32)
Calcium: 8.5 mg/dL — ABNORMAL LOW (ref 8.9–10.3)
Chloride: 106 mmol/L (ref 98–111)
Creatinine, Ser: 1.51 mg/dL — ABNORMAL HIGH (ref 0.61–1.24)
GFR, Estimated: 44 mL/min — ABNORMAL LOW (ref >60)
Glucose, Bld: 126 mg/dL — ABNORMAL HIGH (ref 70–99)
Potassium: 3.7 mmol/L (ref 3.5–5.1)
Sodium: 140 mmol/L (ref 135–145)
Total Bilirubin: 0.5 mg/dL (ref 0.3–1.2)
Total Protein: 7 g/dL (ref 6.5–8.1)

## 2021-04-01 LAB — DIFFERENTIAL
Abs Immature Granulocytes: 0.05 10*3/uL (ref 0.00–0.07)
Basophils Absolute: 0.1 10*3/uL (ref 0.0–0.1)
Basophils Relative: 1 %
Eosinophils Absolute: 0.5 10*3/uL (ref 0.0–0.5)
Eosinophils Relative: 5 %
Immature Granulocytes: 1 %
Lymphocytes Relative: 17 %
Lymphs Abs: 1.7 10*3/uL (ref 0.7–4.0)
Monocytes Absolute: 0.7 10*3/uL (ref 0.1–1.0)
Monocytes Relative: 7 %
Neutro Abs: 6.8 10*3/uL (ref 1.7–7.7)
Neutrophils Relative %: 69 %

## 2021-04-01 LAB — CBC
HCT: 35.4 % — ABNORMAL LOW (ref 39.0–52.0)
Hemoglobin: 10.5 g/dL — ABNORMAL LOW (ref 13.0–17.0)
MCH: 27.2 pg (ref 26.0–34.0)
MCHC: 29.7 g/dL — ABNORMAL LOW (ref 30.0–36.0)
MCV: 91.7 fL (ref 80.0–100.0)
Platelets: 357 10*3/uL (ref 150–400)
RBC: 3.86 MIL/uL — ABNORMAL LOW (ref 4.22–5.81)
RDW: 14.9 % (ref 11.5–15.5)
WBC: 9.8 10*3/uL (ref 4.0–10.5)
nRBC: 0 % (ref 0.0–0.2)

## 2021-04-01 LAB — RESP PANEL BY RT-PCR (FLU A&B, COVID) ARPGX2
Influenza A by PCR: NEGATIVE
Influenza B by PCR: NEGATIVE
SARS Coronavirus 2 by RT PCR: NEGATIVE

## 2021-04-01 LAB — RAPID URINE DRUG SCREEN, HOSP PERFORMED
Amphetamines: NOT DETECTED
Barbiturates: NOT DETECTED
Benzodiazepines: NOT DETECTED
Cocaine: NOT DETECTED
Opiates: NOT DETECTED
Tetrahydrocannabinol: NOT DETECTED

## 2021-04-01 LAB — CBG MONITORING, ED: Glucose-Capillary: 99 mg/dL (ref 70–99)

## 2021-04-01 LAB — AMMONIA: Ammonia: 14 umol/L (ref 9–35)

## 2021-04-01 LAB — ETHANOL: Alcohol, Ethyl (B): <10 mg/dL (ref <10)

## 2021-04-01 LAB — APTT: aPTT: 36 seconds (ref 24–36)

## 2021-04-01 LAB — GLUCOSE, CAPILLARY
Glucose-Capillary: 130 mg/dL — ABNORMAL HIGH (ref 70–99)
Glucose-Capillary: 151 mg/dL — ABNORMAL HIGH (ref 70–99)

## 2021-04-01 LAB — PROTIME-INR
INR: 1.1 (ref 0.8–1.2)
Prothrombin Time: 14 seconds (ref 11.4–15.2)

## 2021-04-01 LAB — TROPONIN I (HIGH SENSITIVITY): Troponin I (High Sensitivity): 19 ng/L — ABNORMAL HIGH (ref <18)

## 2021-04-01 MED ORDER — SODIUM CHLORIDE 0.9 % IV BOLUS
500.0000 mL | Freq: Once | INTRAVENOUS | Status: AC
  Administered 2021-04-01: 500 mL via INTRAVENOUS

## 2021-04-01 MED ORDER — HEPARIN SODIUM (PORCINE) 5000 UNIT/ML IJ SOLN
5000.0000 [IU] | Freq: Three times a day (TID) | INTRAMUSCULAR | Status: DC
  Administered 2021-04-01 – 2021-04-04 (×9): 5000 [IU] via SUBCUTANEOUS
  Filled 2021-04-01 (×8): qty 1

## 2021-04-01 MED ORDER — PANTOPRAZOLE SODIUM 40 MG PO TBEC
40.0000 mg | DELAYED_RELEASE_TABLET | Freq: Every day | ORAL | Status: DC
  Administered 2021-04-01 – 2021-04-04 (×4): 40 mg via ORAL
  Filled 2021-04-01 (×4): qty 1

## 2021-04-01 MED ORDER — SODIUM CHLORIDE 0.9 % IV SOLN: INTRAVENOUS | Status: AC

## 2021-04-01 MED ORDER — INSULIN DETEMIR 100 UNIT/ML ~~LOC~~ SOLN
5.0000 [IU] | Freq: Every day | SUBCUTANEOUS | Status: DC
  Administered 2021-04-01 – 2021-04-03 (×3): 5 [IU] via SUBCUTANEOUS
  Filled 2021-04-01 (×4): qty 0.05

## 2021-04-01 MED ORDER — TAMSULOSIN HCL 0.4 MG PO CAPS
0.4000 mg | ORAL_CAPSULE | Freq: Every day | ORAL | Status: DC
  Administered 2021-04-01 – 2021-04-03 (×3): 0.4 mg via ORAL
  Filled 2021-04-01 (×3): qty 1

## 2021-04-01 MED ORDER — ASPIRIN 81 MG PO CHEW
81.0000 mg | CHEWABLE_TABLET | Freq: Every day | ORAL | Status: DC
Start: 2021-04-02 — End: 2021-04-04
  Administered 2021-04-02 – 2021-04-04 (×3): 81 mg via ORAL
  Filled 2021-04-01 (×3): qty 1

## 2021-04-01 MED ORDER — AMLODIPINE BESYLATE 5 MG PO TABS
10.0000 mg | ORAL_TABLET | Freq: Every day | ORAL | Status: DC
  Administered 2021-04-01 – 2021-04-04 (×4): 10 mg via ORAL
  Filled 2021-04-01 (×4): qty 2

## 2021-04-01 MED ORDER — SODIUM CHLORIDE 0.9 % IV SOLN
1.0000 g | INTRAVENOUS | Status: DC
  Administered 2021-04-02 – 2021-04-04 (×3): 1 g via INTRAVENOUS
  Filled 2021-04-01 (×3): qty 10

## 2021-04-01 MED ORDER — SODIUM CHLORIDE 0.9 % IV SOLN
1.0000 g | Freq: Once | INTRAVENOUS | Status: AC
  Administered 2021-04-01: 1 g via INTRAVENOUS
  Filled 2021-04-01: qty 10

## 2021-04-01 MED ORDER — INSULIN ASPART 100 UNIT/ML IJ SOLN
0.0000 [IU] | Freq: Three times a day (TID) | INTRAMUSCULAR | Status: DC
  Administered 2021-04-01: 1 [IU] via SUBCUTANEOUS
  Administered 2021-04-02: 2 [IU] via SUBCUTANEOUS
  Administered 2021-04-02: 1 [IU] via SUBCUTANEOUS
  Administered 2021-04-04: 2 [IU] via SUBCUTANEOUS

## 2021-04-01 MED ORDER — ATORVASTATIN CALCIUM 10 MG PO TABS
10.0000 mg | ORAL_TABLET | Freq: Every day | ORAL | Status: DC
  Administered 2021-04-02 – 2021-04-04 (×3): 10 mg via ORAL
  Filled 2021-04-01 (×3): qty 1

## 2021-04-01 NOTE — ED Notes (Signed)
Pt sat on bedside commode with assistance. Pt had a bowel movement and voided.  ?

## 2021-04-01 NOTE — ED Provider Notes (Signed)
[facility] EMERGENCY DEPARTMENT Provider Note   CSN: [id number] Arrival date & time: 04/01/21  0700     History  Chief Complaint  Patient presents with   Weakness    Mike Briggs is a 88 y.o. male.   Weakness  This patient is a 88 year old male presenting by ambulance transport.  The paramedics were initially called out because of the patient getting tangled up between his bed in his wheelchair and needing some assistance to get back into bed.  When they arrived they found the patient to be significantly weak compared to his baseline.  They are aware of this patient, they have seen him in the past and note that this patient has been transported to the hospital before, in fact both they and the patient notes that he has had a stroke in the past leaving him with some right-sided weakness.  Review of the medical record shows that he has actually been admitted to the hospital as recently as 1 month ago for generalized weakness and acute metabolic encephalopathy.  He was also found to be septic secondary to a urinary tract infection which showed Klebsiella pneumonia.  He was given IV antibiotics and discharged on oral cephalosporin.  When he was seen at follow-up on February 21 he had a baseline mental status.  This patient lives by himself, he is able to transfer back and forth to his motorized wheelchair for which she gets around.  The patient was not able to even sit on the edge of the bed today without falling over feeling generally weak.  The paramedics noted that his speech was more slurred than usual as well.  Home Medications Prior to Admission medications   Medication Sig Start Date End Date Taking? Authorizing Provider  amLODipine (NORVASC) 10 MG tablet TAKE ONE TABLET BY MOUTH ONCE DAILY 03/28/21   [name], MD  aspirin 81 MG chewable tablet Chew 1 tablet (81 mg total) by mouth daily with breakfast. 12/04/19   Perlie Mayo, NP  atorvastatin (LIPITOR) 10 MG tablet TAKE 1  TABLET BY MOUTH AFTER SUPPER. 03/28/21   [name], MD  blood glucose meter kit and supplies Dispense based on patient and insurance preference. Use up to four times daily as directed. (FOR ICD-10 E10.9, E11.9). 04/05/20   [name], MD  cefadroxil (DURICEF) 500 MG capsule Take 1 capsule (500 mg total) by mouth 2 (two) times daily. 03/16/21   Kathie Dike, MD  LANTUS SOLOSTAR 100 UNIT/ML Solostar Pen Inject 20 Units into the skin daily. 10/10/20   [name], MD  lisinopril (ZESTRIL) 40 MG tablet TAKE (1) TABLET BY MOUTH ONCE DAILY. 02/22/21   [name], MD  memantine (NAMENDA) 10 MG tablet TAKE (1) TABLET BY MOUTH TWICE DAILY. 03/28/21   [name], MD  mupirocin cream (BACTROBAN) 2 % Apply 1 application topically 2 (two) times daily. 04/05/20   [name], MD  pantoprazole (PROTONIX) 40 MG tablet Take 1 tablet (40 mg total) by mouth daily. 01/28/21   Orson Eva, MD  SURE COMFORT PEN NEEDLES 31G X 5 MM MISC USE TO INJECT AS DIRECTED FOUR TIMES A DAY. WITH MEALS AND BEFORE BEDTIME. 08/17/20   [name], MD  tamsulosin (FLOMAX) 0.4 MG CAPS capsule TAKE 1 CAPSULE BY MOUTH DAILY AFTER BREAKFAST. 03/29/21   Irine Seal, MD      Allergies    Patient has no known allergies.    Review of Systems  Review of Systems  Neurological:  Positive for weakness.  All other systems reviewed and are negative.  Physical Exam Updated Vital Signs BP (!) 160/80    Pulse 91    Temp 98.5 F (36.9 C)    Resp 16    Ht 1.803 m (5' 11")    Wt 102.3 kg    SpO2 100%    BMI 31.46 kg/m  Physical Exam Vitals and nursing note reviewed.  Constitutional:      Appearance: He is well-developed. He is ill-appearing.  HENT:     Head: Normocephalic and atraumatic.     Nose: No congestion or rhinorrhea.     Mouth/Throat:     Mouth: Mucous membranes are dry.     Pharynx: No oropharyngeal exudate.  Eyes:     General: No scleral icterus.       Right eye: No discharge.        Left eye: No  discharge.     Conjunctiva/sclera: Conjunctivae normal.     Pupils: Pupils are equal, round, and reactive to light.  Neck:     Thyroid: No thyromegaly.     Vascular: No JVD.  Cardiovascular:     Rate and Rhythm: Normal rate. Rhythm irregular.     Heart sounds: Normal heart sounds. No murmur heard.   No friction rub. No gallop.  Pulmonary:     Effort: Pulmonary effort is normal. No respiratory distress.     Breath sounds: Normal breath sounds. No wheezing or rales.  Abdominal:     General: Bowel sounds are normal. There is no distension.     Palpations: Abdomen is soft. There is no mass.     Tenderness: There is no abdominal tenderness.  Musculoskeletal:        General: No swelling, tenderness, deformity or signs of injury. Normal range of motion.     Cervical back: Normal range of motion and neck supple.     Right lower leg: No edema.     Left lower leg: No edema.  Lymphadenopathy:     Cervical: No cervical adenopathy.  Skin:    General: Skin is warm and dry.     Findings: No erythema or rash.  Neurological:     Mental Status: He is alert.     Coordination: Coordination normal.     Comments: The patient is somnolent but easily arousable, he is able to talk but does so very slowly, he does appear to have some slurred speech.  He is able to lift both arms off the bed symmetrically with equal strength, both legs can straight leg raise with equal strength, he is overall generally weak but able to perform symmetrically.  He answers questions, he is able to follow commands as far as anything in the supine position but when he sits up on the edge of the bed he needs significant assistance.  Psychiatric:        Behavior: Behavior normal.    ED Results / Procedures / Treatments   Labs (all labs ordered are listed, but only abnormal results are displayed) Labs Reviewed  CBC - Abnormal; Notable for the following components:      Result Value   RBC 3.86 (*)    Hemoglobin 10.5 (*)     HCT 35.4 (*)    MCHC 29.7 (*)    All other components within normal limits  COMPREHENSIVE METABOLIC PANEL - Abnormal; Notable for the following components:   Glucose, Bld 126 (*)  Creatinine, Ser 1.51 (*)    Calcium 8.5 (*)    Albumin 2.5 (*)    GFR, Estimated 44 (*)    All other components within normal limits  URINALYSIS, ROUTINE W REFLEX MICROSCOPIC - Abnormal; Notable for the following components:   Color, Urine AMBER (*)    APPearance CLOUDY (*)    Hgb urine dipstick LARGE (*)    Protein, ur 100 (*)    Leukocytes,Ua MODERATE (*)    RBC / HPF >50 (*)    Bacteria, UA RARE (*)    All other components within normal limits  TROPONIN I (HIGH SENSITIVITY) - Abnormal; Notable for the following components:   Troponin I (High Sensitivity) 19 (*)    All other components within normal limits  RESP PANEL BY RT-PCR (FLU A&B, COVID) ARPGX2  ETHANOL  PROTIME-INR  APTT  DIFFERENTIAL  RAPID URINE DRUG SCREEN, HOSP PERFORMED  AMMONIA  CBG MONITORING, ED    EKG EKG Interpretation  Date/Time:  Saturday April 01 2021 07:17:00 EST Ventricular Rate:  87 PR Interval:  226 QRS Duration: 86 QT Interval:  391 QTC Calculation: 471 R Axis:   -11 Text Interpretation: Sinus rhythm Multiform ventricular premature complexes Prolonged PR interval Abnormal R-wave progression, early transition Confirmed by Noemi Chapel [phone]) on 04/01/2021 7:39:55 AM  Radiology CT HEAD WO CONTRAST  Result Date: 04/01/2021 CLINICAL DATA:  88 year old male acute neurologic deficit. Weakness and slurred speech. EXAM: CT HEAD WITHOUT CONTRAST TECHNIQUE: Contiguous axial images were obtained from the base of the skull through the vertex without intravenous contrast. RADIATION DOSE REDUCTION: This exam was performed according to the departmental dose-optimization program which includes automated exposure control, adjustment of the mA and/or kV according to patient size and/or use of iterative reconstruction technique.  COMPARISON:  Brain MRI 11/14/2018. Head CT 03/12/2021. FINDINGS: Brain: Stable cerebral volume. No midline shift, mass effect, or evidence of intracranial mass lesion. No ventriculomegaly. Patchy and confluent bilateral cerebral white matter hypodensity appears stable. Stable gray-white matter differentiation throughout the brain. No cortically based acute infarct identified. No acute intracranial hemorrhage identified. ASPECTS 10. no cortical encephalomalacia identified. Vascular: Calcified atherosclerosis at the skull base. No suspicious intracranial vascular hyperdensity. Skull: No acute osseous abnormality identified. Sinuses/Orbits: Chronic right sphenoid sinus mucosal thickening. Other Visualized paranasal sinuses and mastoids are stable and well aerated. Other: No acute orbit or scalp soft tissue finding. IMPRESSION: Stable non contrast CT appearance of the brain. No acute cortically based infarct or acute intracranial hemorrhage identified. ASPECTS 10. Electronically Signed   By: Genevie Ann M.D.   On: 04/01/2021 08:05    Procedures Procedures    Medications Ordered in ED Medications  cefTRIAXone (ROCEPHIN) 1 g in sodium chloride 0.9 % 100 mL IVPB (1 g Intravenous New Bag/Given 04/01/21 1311)  sodium chloride 0.9 % bolus 500 mL (0 mLs Intravenous Stopped 04/01/21 1136)    ED Course/ Medical Decision Making/ A&P                           Medical Decision Making Amount and/or Complexity of Data Reviewed Labs: ordered. Radiology: ordered.   This patient presents to the ED for concern of generalized weakness, this involves an extensive number of treatment options, and is a complaint that carries with it a high risk of complications and morbidity.  The differential diagnosis includes encephalopathy, recurrent infection, possible stroke   Co morbidities that complicate the patient evaluation  Diabetes, prior stroke, recurrent infections  Additional history obtained:  Additional history  obtained from paramedics and the external medical record External records from outside source obtained and reviewed including recent history and discharge summary suggestive of recurrent urinary tract infections   Lab Tests:  I Ordered, and personally interpreted labs.  The pertinent results include: CBC which was rather unremarkable, no leukocytosis, chronic mild anemia, metabolic panel showing some worsening renal insufficiency but not severe, he does have a urinary tract infection with bacteria and white blood cells seen.  The blood is likely from the catheter sample that was attempted.   Imaging Studies ordered:  I ordered imaging studies including CT scan of the brain I independently visualized and interpreted imaging which showed no acute findings to suggest a new stroke I agree with the radiologist interpretation   Cardiac Monitoring:  The patient was maintained on a cardiac monitor.  I personally viewed and interpreted the cardiac monitored which showed an underlying rhythm of: Normal sinus rhythm, frequent ectopy   Medicines ordered and prescription drug management:  I ordered medication including Rocephin and IV fluids for dehydration and urinary tract infection Reevaluation of the patient after these medicines showed that the patient improved I have reviewed the patients home medicines and have made adjustments as needed   Test Considered:  CT scan of the abdomen and pelvis however there is no significant abdominal tenderness   Critical Interventions:  Antibiotics and IV fluids with admission to the hospital   Consultations Obtained:  I requested consultation with the hospitalist, Dr. Dyann Kief,  and discussed lab and imaging findings as well as pertinent plan - they recommend: Admission to the hospital   Problem List / ED Course:  IV fluids and antibiotics given    Social Determinants of Health:  Lives by himself, debilitated, in a  wheelchair           Final Clinical Impression(s) / ED Diagnoses Final diagnoses:  Acute encephalopathy  Acute cystitis with hematuria    Rx / DC Orders ED Discharge Orders     None         Noemi Chapel, MD 04/01/21 1331

## 2021-04-01 NOTE — ED Notes (Signed)
Patient transported to CT 

## 2021-04-01 NOTE — ED Notes (Addendum)
Dr. Sabra Heck in room at time of triage to assess patient, aware of patient complaint/reported symptoms.  ?

## 2021-04-01 NOTE — Assessment & Plan Note (Addendum)
-  Overall stable ?-Safe to resume ACE inhibitor's at time of discharge ?-Continue the use of Norvasc ?-Heart healthy diet encouraged. ?-Reassess blood pressure at follow-up visit. ? ?

## 2021-04-01 NOTE — Assessment & Plan Note (Signed)
-  Continue the use of Flomax. ?

## 2021-04-01 NOTE — Assessment & Plan Note (Addendum)
-   Patient with underlying history of chronic cystitis and also chronic use of Namenda which can increase chances for urinary tract infection and retention. ?-Prior cultures demonstrated the presence of Klebsiella sensitive to cephalosporin. ?-Patient has been discharged on cefdinir 300 mg twice a day and after completing acute therapy we pursued suppressive treatment with treatment with trimethoprim 100 mg daily. ?-Continue to maintain adequate hydration. ?-Namenda has been discontinue. ?-Patient follow-up with PCP in 10 days. ?

## 2021-04-01 NOTE — Assessment & Plan Note (Addendum)
-  In the setting of prerenal azotemia, continue use of nephrotoxic agents and UTI. ?-Continue to maintain adequate hydration, avoid nephrotoxic agents, contrast and hypotension. ?-Follow renal function trend/stability ?-Continue to treat UTI. ? ?

## 2021-04-01 NOTE — Assessment & Plan Note (Addendum)
-  Patient with stage II pressure injury present at time of admission. ?-Continue preventive measures and constant repositioning. ?-Essentially at baseline patient is bedbound and using motor wheelchair. ?-No signs of superimposed infection. ?

## 2021-04-01 NOTE — ED Triage Notes (Signed)
Patient to ED via EMS with complaints of generalized weakness for "a long time." EMS states that his speech is slurred more than normal but there is no last known well time and patient cannot state how long he has been like this.  ?

## 2021-04-01 NOTE — ED Notes (Signed)
$  143 sent via security to be locked up for this patient.  ?

## 2021-04-01 NOTE — H&P (Signed)
History and Physical    Patient: Mike Briggs LPF:[id number] DOB: 1928-05-17 DOA: 04/01/2021 DOS: the patient was seen and examined on 04/01/2021 PCP: [name], MD  Patient coming from: Home  Chief Complaint:  Chief Complaint  Patient presents with   Weakness    HPI: Mike Briggs is a 88 y.o. male with medical history significant of hypertension, hyperlipidemia, chronic kidney disease a stage IIIb, type 2 diabetes with nephropathy, BPH and prior history of a stroke with vascular dementia; who presented to the hospital secondary to generalized weakness and metabolic encephalopathy.  Patient reports symptoms present for the last 24 hours and worsening; this morning unable to get out of bed at his normal level to and transferring to his motor wheelchair.  He reported to be significantly weak generally speaking and at the moment that the EMS was activated was found to be with increased lethargy mumbling words and is slightly disoriented.  After arrival to the emergency department and receiving fluid resuscitation he started feeling better and reported no chest pain, no focal weakness, no nausea, no vomiting, no hematuria, no melena, no hematochezia, no chest pain, no shortness of breath, no abdominal pain.  CT scan of the head demonstrated no acute cardiopulmonary process; COVID PCR was negative and patient urinalysis suggested UTI.  Blood work specifically basic metabolic panel demonstrates acute kidney injury (creatinine 1.5-1) at baseline creatinine in the 1.1 range.  Stable electrolytes appreciated.  Flat elevation of his troponin and 19 without presence of chest pain as mentioned above.  Normal ammonia level.  Negative UDS.  IV fluids were provided, cultures of his urine retrieve and ceftriaxone has been started.  [facility] hospitalist has been called to place patient in the hospital for further evaluation and management.  Review of Systems: As mentioned in the history of present  illness. All other systems reviewed and are negative. Past Medical History:  Diagnosis Date   Acute cystitis 12/17/2018   Acute encephalopathy 53/29/9242   Acute metabolic encephalopathy 6/83/4196   ANKLE, ARTHRITIS, DEGEN./OSTEO 12/16/2008   Qualifier: Diagnosis of  By: Aline Brochure MD, Dorothyann Peng     Arthritis    Cataract    Chronic kidney disease    COVID-19    COVID-19 virus detected 11/03/2018   COVID-19 virus infection 11/03/2018   Dehydration 11/02/2018   Depression    Diabetes mellitus    Gram-positive bacteremia 11/03/2018   Hyperlipidemia    Hypertension    Hypomagnesemia 03/24/2019   Klebsiella UTI  03/25/2019   Pain in finger of right hand 05/27/2019   Personal history of gout 05/22/2016   Rhabdomyolysis 11/02/2018   Sepsis secondary to UTI ([place]) 03/13/2021   Stroke ([place])    Swelling of both hands 05/27/2019   Syncope 02/25/2018   TIA (transient ischemic attack) 11/14/2018   UTI (urinary tract infection) 02/26/2018   Vitamin D deficiency 05/31/2016   Past Surgical History:  Procedure Laterality Date   BACK SURGERY     cervical and lumbar   CATARACT EXTRACTION     CHOLECYSTECTOMY     FOOT SURGERY     5th toe    SPINE SURGERY     lumbar and cervical fusions   Social History:  reports that he quit smoking about 53 years ago. His smoking use included cigarettes. He has never used smokeless tobacco. He reports that he does not drink alcohol and does not use drugs.  No Known Allergies  Family History  Family history unknown: Yes  Prior to Admission medications   Medication Sig Start Date End Date Taking? Authorizing Provider  amLODipine (NORVASC) 10 MG tablet TAKE ONE TABLET BY MOUTH ONCE DAILY 03/28/21   [name], MD  aspirin 81 MG chewable tablet Chew 1 tablet (81 mg total) by mouth daily with breakfast. 12/04/19   Perlie Mayo, NP  atorvastatin (LIPITOR) 10 MG tablet TAKE 1 TABLET BY MOUTH AFTER SUPPER. 03/28/21   [name], MD  blood glucose meter kit and  supplies Dispense based on patient and insurance preference. Use up to four times daily as directed. (FOR ICD-10 E10.9, E11.9). 04/05/20   [name], MD  LANTUS SOLOSTAR 100 UNIT/ML Solostar Pen Inject 20 Units into the skin daily. 10/10/20   [name], MD  lisinopril (ZESTRIL) 40 MG tablet TAKE (1) TABLET BY MOUTH ONCE DAILY. 02/22/21   [name], MD  memantine (NAMENDA) 10 MG tablet TAKE (1) TABLET BY MOUTH TWICE DAILY. 03/28/21   [name], MD  mupirocin cream (BACTROBAN) 2 % Apply 1 application topically 2 (two) times daily. 04/05/20   [name], MD  pantoprazole (PROTONIX) 40 MG tablet Take 1 tablet (40 mg total) by mouth daily. 01/28/21   Orson Eva, MD  SURE COMFORT PEN NEEDLES 31G X 5 MM MISC USE TO INJECT AS DIRECTED FOUR TIMES A DAY. WITH MEALS AND BEFORE BEDTIME. 08/17/20   [name], MD  tamsulosin (FLOMAX) 0.4 MG CAPS capsule TAKE 1 CAPSULE BY MOUTH DAILY AFTER BREAKFAST. 03/29/21   Irine Seal, MD    Physical Exam: Vitals:   04/01/21 1220 04/01/21 1221 04/01/21 1230 04/01/21 1300  BP:   (!) 143/87 (!) 160/80  Pulse: 97 98 92 91  Resp: (!) _0 Temp:      SpO2: 99% 99% 97% 100%  Weight:      Height:       General exam: Alert, awake, oriented x 3; slow to respond but appropriate.  Currently afebrile and following commands appropriately.  No chest pain, no nausea, no vomiting. Respiratory system: Clear to auscultation. Respiratory effort normal.  No using accessory muscle.  Good saturation on room air. Cardiovascular system:RRR. No murmurs, rubs, gallops.  No JVD. Gastrointestinal system: Abdomen is nondistended, soft and nontender. No organomegaly or masses felt. Normal bowel sounds heard. Central nervous system: Generally weak without focal deficits; moving 4 limbs spontaneously and able to maintain them up against gravity during examination. Extremities: No cyanosis or clubbing. Skin: Stage II pressure injuries in his buttocks bilaterally and  without signs of superimposed infection.  Present at time of admission. Psychiatry: Mood & affect appropriate.    Data Reviewed: CT head negative for acute intracranial abnormalities Urinalysis suggestive for UTI Basic metabolic panel demonstrating acute kidney injury with creatinine up to 1.51; normal electrolytes. CBC with WBCs of 9.8 and a creatinine of 10.5. Negative UDS.  Assessment and Plan: * UTI (urinary tract infection) - Patient with underlying history of chronic cystitis and also chronic use of Namenda which can increase chances for urinary tract infection and retention. -Prior cultures demonstrating the presence of Klebsiella sensitive to cephalosporin. -Started on ceftriaxone -Will provide fluid resuscitation and follow culture results. -Continue supportive care. -At this moment patient might benefit of antibiotic suppressive therapy at discharge.  AKI (acute kidney injury) ([place]) -As mentioned above in the setting of prerenal azotemia, continue use of nephrotoxic agents and UTI. -Continue fluid resuscitation, avoid nephrotoxic agents, treat with antibiotics appropriately  and follow renal function trend. -Continue supportive care and follow clinical response  Chronic kidney disease, stage 3b ([place]) -With mild acute kidney injury component. -Avoid nephrotoxic agents -Treat UTI and provide fluid resuscitation -Will avoid the use of contrast and avoid hypotension. -Patient renal failure secondary to diabetic nephropathy.  Gastroesophageal reflux disease - Continue PPI.  Pressure injury of skin -Patient with a stage II pressure injury present at time of admission. -Continue preventive measures and constant repositioning. -Essentially at baseline patient is bedbound and using motor wheelchair.  Hyperlipidemia -Continue statins.  BPH (benign prostatic hyperplasia) -Continue the use of Flomax.  Essential hypertension -Overall stable -Holding ACE inhibitors in the  setting of acute kidney injury -Provide fluid resuscitation and follow vital signs. -Continue the use of Norvasc.  Type 2 diabetes mellitus with diabetic chronic kidney disease (HCC) -Continue sliding scale insulin and Levemir -Follow CBGs and adjust hypoglycemic regimen as required -Heart healthy modified carbohydrate diet has been ordered. -Recent A1c (February 2023) 5.8.       Advance Care Planning: full code.   Consults: Palliative care  Family Communication: No family at bedside.  Severity of Illness: The appropriate patient status for this patient is INPATIENT. Inpatient status is judged to be reasonable and necessary in order to provide the required intensity of service to ensure the patient's safety. The patient's presenting symptoms, physical exam findings, and initial radiographic and laboratory data in the context of their chronic comorbidities is felt to place them at high risk for further clinical deterioration. Furthermore, it is not anticipated that the patient will be medically stable for discharge from the hospital within 2 midnights of admission.   * I certify that at the point of admission it is my clinical judgment that the patient will require inpatient hospital care spanning beyond 2 midnights from the point of admission due to high intensity of service, high risk for further deterioration and high frequency of surveillance required.*  Author: Barton Dubois, MD 04/01/2021 1:49 PM  For on call review [email or web address].

## 2021-04-01 NOTE — ED Notes (Signed)
Unable to obtain urine from in/out cath 47F. Meeting resistance prior to advancing catheter for urine collection. Bladder scanner reads 315mL urine in bladder. When withdrawing the catheter frm penis, blood noted coming from tip of penis.  ?

## 2021-04-01 NOTE — Assessment & Plan Note (Addendum)
-  Continue statins. -Heart healthy diet encouraged. 

## 2021-04-01 NOTE — Assessment & Plan Note (Addendum)
-  Recent A1c (February 2023) 5.8. ?-Modified carbohydrate diet has been ordered. ?-Educated about not skipping meals. ?-Resume home hypoglycemic regimen. ?

## 2021-04-01 NOTE — Assessment & Plan Note (Addendum)
-  Continue PPI. °-Lifestyle changes discussed with patient. °

## 2021-04-01 NOTE — Assessment & Plan Note (Addendum)
-  With mild acute kidney injury component due to continued use of nephrotoxic agents and UTI. ?-Continue to maintain adequate hydration ?-Complete treatment for UTI ?-Repeat basic metabolic panel to follow electrolytes and renal function trend. ? ?

## 2021-04-02 DIAGNOSIS — N390 Urinary tract infection, site not specified: Secondary | ICD-10-CM

## 2021-04-02 DIAGNOSIS — N1832 Chronic kidney disease, stage 3b: Secondary | ICD-10-CM | POA: Diagnosis not present

## 2021-04-02 DIAGNOSIS — N179 Acute kidney failure, unspecified: Secondary | ICD-10-CM | POA: Diagnosis not present

## 2021-04-02 DIAGNOSIS — N400 Enlarged prostate without lower urinary tract symptoms: Secondary | ICD-10-CM | POA: Diagnosis not present

## 2021-04-02 LAB — GLUCOSE, CAPILLARY
Glucose-Capillary: 88 mg/dL (ref 70–99)
Glucose-Capillary: 137 mg/dL — ABNORMAL HIGH (ref 70–99)
Glucose-Capillary: 156 mg/dL — ABNORMAL HIGH (ref 70–99)
Glucose-Capillary: 117 mg/dL — ABNORMAL HIGH (ref 70–99)

## 2021-04-02 MED ORDER — CHLORHEXIDINE GLUCONATE CLOTH 2 % EX PADS
6.0000 | MEDICATED_PAD | Freq: Every day | CUTANEOUS | Status: DC
  Administered 2021-04-02 – 2021-04-04 (×3): 6 via TOPICAL

## 2021-04-02 MED ORDER — MUPIROCIN 2 % EX OINT
1.0000 "application " | TOPICAL_OINTMENT | Freq: Two times a day (BID) | CUTANEOUS | Status: DC
  Administered 2021-04-02 – 2021-04-04 (×5): 1 via NASAL
  Filled 2021-04-02 (×2): qty 22

## 2021-04-02 NOTE — Plan of Care (Signed)
  Problem: Nutrition: Goal: Adequate nutrition will be maintained Outcome: Progressing   Problem: Pain Managment: Goal: General experience of comfort will improve Outcome: Progressing   Problem: Safety: Goal: Ability to remain free from injury will improve Outcome: Progressing   

## 2021-04-02 NOTE — Progress Notes (Signed)
?Progress Note ? ? ?Patient: Mike Briggs LDJ:[id number] DOB: Nov 20, 1928 DOA: 04/01/2021     1 ?DOS: the patient was seen and examined on 04/02/2021 ?  ?Brief hospital admission narrative: ?Mike Briggs is a 88 y.o. male with medical history significant of hypertension, hyperlipidemia, chronic kidney disease a stage IIIb, type 2 diabetes with nephropathy, BPH and prior history of a stroke with vascular dementia; who presented to the hospital secondary to generalized weakness and metabolic encephalopathy.  Patient reports symptoms present for the last 24 hours and worsening; this morning unable to get out of bed at his normal level to and transferring to his motor wheelchair.  He reported to be significantly weak generally speaking and at the moment that the EMS was activated was found to be with increased lethargy mumbling words and is slightly disoriented.  After arrival to the emergency department and receiving fluid resuscitation he started feeling better and reported no chest pain, no focal weakness, no nausea, no vomiting, no hematuria, no melena, no hematochezia, no chest pain, no shortness of breath, no abdominal pain. ?  ?CT scan of the head demonstrated no acute cardiopulmonary process; COVID PCR was negative and patient urinalysis suggested UTI.  Blood work specifically basic metabolic panel demonstrates acute kidney injury (creatinine 1.5-1) at baseline creatinine in the 1.1 range.  Stable electrolytes appreciated.  Flat elevation of his troponin and 19 without presence of chest pain as mentioned above.  Normal ammonia level.  Negative UDS. ?  ?IV fluids were provided, cultures of his urine retrieve and ceftriaxone has been started.  [facility] hospitalist has been called to place patient in the hospital for further evaluation and management. ? ?Assessment and Plan: ?* UTI (urinary tract infection) ?- Patient with underlying history of chronic cystitis and also chronic use of Namenda which can increase  chances for urinary tract infection and retention. ?-Prior cultures demonstrated the presence of Klebsiella sensitive to cephalosporin. ?-Continue IV ceftriaxone and follow culture results. ?-Continue fluid resuscitation and supportive care. ?-Namenda has been discontinue. ?-At this moment patient might benefit of antibiotic suppressive therapy at discharge. ? ?AKI (acute kidney injury) ([place]) ?-In the setting of prerenal azotemia, continue use of nephrotoxic agents and UTI. ?-Continue to maintain adequate hydration, avoid nephrotoxic agents, contrast and hypotension. ?-Follow renal function trend/stability ?-Continue to treat UTI. ? ? ?Chronic kidney disease, stage 3b ([place]) ?-With mild acute kidney injury component. ?-Continue to avoid nephrotoxic agents ?-Continue fluid resuscitation and treatment for UTI. ?-Follow renal function trend and stability. ? ?Gastroesophageal reflux disease ?-Continue PPI. ? ?Pressure injury of skin ?-Patient with stage II pressure injury present at time of admission. ?-Continue preventive measures and constant repositioning. ?-Essentially at baseline patient is bedbound and using motor wheelchair. ?-No signs of superimposed infection. ? ?Hyperlipidemia ?-Continue statins. ? ?BPH (benign prostatic hyperplasia) ?-Continue the use of Flomax. ? ?Essential hypertension ?-Overall stable ?-Continue holding ACE inhibitors in the setting of acute kidney injury ?-Continue the use of Norvasc. ? ?Type 2 diabetes mellitus with diabetic chronic kidney disease ([place]) ?-Recent A1c (February 2023) 5.8. ?-Continue sliding scale insulin and Levemir. ?-Modified carbohydrate diet has been ordered. ? ? ?Subjective:  ?Currently afebrile, no chest pain, no nausea or vomiting.  Reported generalized weakness.  No overnight events. ? ?Physical Exam: ?Vitals:  ? 04/01/21 2340 04/02/21 0404 04/02/21 1100 04/02/21 1241  ?BP: (!) 145/60 128/87  139/74  ?Pulse: 65 89  78  ?Resp: 18 19  20   ?Temp: 98.3 ?F (36.8 ?C) 98.2  ?F (36.8 ?  C)  (!) 97.4 ?F (36.3 ?C)  ?TempSrc: Oral  Oral Oral  ?SpO2: 100% 99%  98%  ?Weight:      ?Height:      ? ?General exam: Alert, awake, oriented x 3, reports feeling better, no chest pain, no nausea, no vomiting.  Following commands appropriately.  Is present generalized weakness. ?Respiratory system: Clear to auscultation. Respiratory effort normal.  No using accessory muscles.  Good saturation on room air. ?Cardiovascular system:RRR. No murmurs, rubs, gallops.  No JVD. ?Gastrointestinal system: Abdomen is nondistended, soft and nontender. No organomegaly or masses felt. Normal bowel sounds heard. ?Central nervous system: No new focal deficits. ?Extremities: No cyanosis or clubbing; no edema ?Skin: No petechiae.  Present on admission stage II bilateral buttocks pressure injury without signs of superimposed infection. ?Psychiatry: Mood & affect appropriate.  ? ? ?Data Reviewed: ?Normal ammonia level ?Negative COVID by PCR ?Pending urine culture. ? ?Family Communication:  ? ?Disposition: ?Status is: Inpatient ?Remains inpatient appropriate because: Patient actively requiring treatment with IV antibiotics for presentation of UTI.  Receiving fluid resuscitation for acute on chronic renal failure. ? ? ?Planned Discharge Destination: To be determined. ? ? ? ?Author: ?Barton Dubois, MD ?04/02/2021 4:47 PM ? ?For on call review [email or web address].  ? ?

## 2021-04-03 LAB — BASIC METABOLIC PANEL
Anion gap: 6 (ref 5–15)
BUN: 15 mg/dL (ref 8–23)
CO2: 24 mmol/L (ref 22–32)
Calcium: 8 mg/dL — ABNORMAL LOW (ref 8.9–10.3)
Chloride: 109 mmol/L (ref 98–111)
Creatinine, Ser: 1.17 mg/dL (ref 0.61–1.24)
GFR, Estimated: 59 mL/min — ABNORMAL LOW (ref >60)
Glucose, Bld: 79 mg/dL (ref 70–99)
Potassium: 4 mmol/L (ref 3.5–5.1)
Sodium: 139 mmol/L (ref 135–145)

## 2021-04-03 LAB — CBC
HCT: 29.3 % — ABNORMAL LOW (ref 39.0–52.0)
Hemoglobin: 8.6 g/dL — ABNORMAL LOW (ref 13.0–17.0)
MCH: 27 pg (ref 26.0–34.0)
MCHC: 29.4 g/dL — ABNORMAL LOW (ref 30.0–36.0)
MCV: 92.1 fL (ref 80.0–100.0)
Platelets: 309 10*3/uL (ref 150–400)
RBC: 3.18 MIL/uL — ABNORMAL LOW (ref 4.22–5.81)
RDW: 14.8 % (ref 11.5–15.5)
WBC: 9.1 10*3/uL (ref 4.0–10.5)
nRBC: 0 % (ref 0.0–0.2)

## 2021-04-03 LAB — GLUCOSE, CAPILLARY
Glucose-Capillary: 75 mg/dL (ref 70–99)
Glucose-Capillary: 113 mg/dL — ABNORMAL HIGH (ref 70–99)
Glucose-Capillary: 106 mg/dL — ABNORMAL HIGH (ref 70–99)
Glucose-Capillary: 118 mg/dL — ABNORMAL HIGH (ref 70–99)

## 2021-04-03 NOTE — Progress Notes (Signed)
?Progress Note ? ? ?Patient: Mike Briggs HAL:[id number] DOB: [birth year] DOA: 04/01/2021     2 ?DOS: the patient was seen and examined on 04/03/2021 ?  ?Brief hospital admission narrative: ?Mike Briggs Mike Briggs is a 88 y.o. male with medical history significant of hypertension, hyperlipidemia, chronic kidney disease a stage IIIb, type 2 diabetes with nephropathy, BPH and prior history of a stroke with vascular dementia; who presented to the hospital secondary to generalized weakness and metabolic encephalopathy.  Patient reports symptoms present for the last 24 hours and worsening; this morning unable to get out of bed at his normal level to and transferring to his motor wheelchair.  He reported to be significantly weak generally speaking and at the moment that the EMS was activated was found to be with increased lethargy mumbling words and is slightly disoriented.  After arrival to the emergency department and receiving fluid resuscitation he started feeling better and reported no chest pain, no focal weakness, no nausea, no vomiting, no hematuria, no melena, no hematochezia, no chest pain, no shortness of breath, no abdominal pain. ?  ?CT scan of the head demonstrated no acute cardiopulmonary process; COVID PCR was negative and patient urinalysis suggested UTI.  Blood work specifically basic metabolic panel demonstrates acute kidney injury (creatinine 1.5-1) at baseline creatinine in the 1.1 range.  Stable electrolytes appreciated.  Flat elevation of his troponin and 19 without presence of chest pain as mentioned above.  Normal ammonia level.  Negative UDS. ?  ?IV fluids were provided, cultures of his urine retrieve and ceftriaxone has been started.  [facility] hospitalist has been called to place patient in the hospital for further evaluation and management. ? ?Assessment and Plan: ?* UTI (urinary tract infection) ?- Patient with underlying history of chronic cystitis and also chronic use of Namenda which can increase  chances for urinary tract infection and retention. ?-Prior cultures demonstrated the presence of Klebsiella sensitive to cephalosporin. ?-Continue IV ceftriaxone and follow culture results. ?-Continue to maintain adequate hydration. ?-Namenda has been discontinue. ?-At this moment patient might benefit of antibiotic suppressive therapy at time of discharge. ? ?AKI (acute kidney injury) (Mike Briggs) ?-In the setting of prerenal azotemia, continue use of nephrotoxic agents and UTI. ?-Continue to maintain adequate hydration, avoid nephrotoxic agents, contrast and hypotension. ?-Follow renal function trend/stability ?-Continue to treat UTI. ? ? ?Chronic kidney disease, stage 3b (Mike Briggs) ?-With mild acute kidney injury component due to continued use of nephrotoxic agents and UTI. ?-Continue to maintain adequate hydration ?-Continue treatment for UTI ?-Continue to follow renal function trend intermittently.. ? ? ?Gastroesophageal reflux disease ?-Continue PPI. ?-Lifestyle changes discussed with patient. ? ?Pressure injury of skin ?-Patient with stage II pressure injury present at time of admission. ?-Continue preventive measures and constant repositioning. ?-Essentially at baseline patient is bedbound and using motor wheelchair. ?-No signs of superimposed infection. ? ?Hyperlipidemia ?-Continue statins. ?-Heart healthy diet encouraged. ? ?BPH (benign prostatic hyperplasia) ?-Continue the use of Flomax. ? ?Essential hypertension ?-Overall stable ?-Safe to resume ACE inhibitor's at time of discharge ?-Continue the use of Norvasc ?-Heart healthy diet encouraged. ? ? ?Type 2 diabetes mellitus with diabetic chronic kidney disease (Mike Briggs) ?-Recent A1c (February 2023) 5.8. ?-Continue sliding scale insulin and Levemir. ?-Modified carbohydrate diet has been ordered. ?-Educated about not skipping meals. ? ? ?Subjective:  ?Afebrile, no chest pain, no nausea, no vomiting.  Reports feeling close to baseline.  Denies dysuria. ? ?Physical  Exam: ?Vitals:  ? 04/02/21 1241 04/02/21 2102 04/03/21 0420 04/03/21 1443  ?BP:  139/74 134/65 (!) 143/52 131/63  ?Pulse: 78 71 (!) 56 84  ?Resp: 20 18 15 19   ?Temp: (!) 97.4 ?F (36.3 ?C) 98.3 ?F (36.8 ?C) 98.3 ?F (36.8 ?C) 98.3 ?F (36.8 ?C)  ?TempSrc: Oral Oral Oral   ?SpO2: 98% 98% 98% 100%  ?Weight:      ?Height:      ? ?General exam: Alert, awake, oriented x 3; patient reports feeling significantly better today.  No chest pain, no nausea, no vomiting, no dysuria.  No fever ?Respiratory system: Clear to auscultation. Respiratory effort normal.  Good saturation on room air.  No using accessory muscle. ?Cardiovascular system:RRR. No murmurs, rubs, gallops.  No JVD. ?Gastrointestinal system: Abdomen is nondistended, soft and nontender. No organomegaly or masses felt. Normal bowel sounds heard. ?Central nervous system: No new focal neurological deficits. ?Extremities: No cyanosis or clubbing. ?Skin: Unchanged stage II bilateral buttocks pressure injury without signs of superimposed infection (present on admission). ?Psychiatry: Judgement and insight appear normal. Mood & affect appropriate.  ? ? ? ?Data Reviewed: ?CBC demonstrating normal WBCs at this time; stable hemoglobin and normal platelets. ?-Patient denying dysuria ? ? ?Family Communication:  ? ?Disposition: ?Status is: Inpatient ?Remains inpatient appropriate because: Patient actively requiring treatment with IV antibiotics for presentation of UTI.  Receiving fluid resuscitation for acute on chronic renal failure. ? ? ?Planned Discharge Destination: To be determined. ? ? ? ?Author: ?Barton Dubois, MD ?04/03/2021 5:14 PM ? ?For on call review [email or web address].  ? ?

## 2021-04-03 NOTE — Progress Notes (Signed)
?  Transition of Care (TOC) Screening Note ? ? ?Patient Details  ?Name: Mike Briggs ?Date of Birth: 07/02/1928 ? ? ?Transition of Care (TOC) CM/SW Contact:    ?Boneta Lucks, RN ?Phone Number: ?04/03/2021, 2:18 PM ? ?PT eval  and Palliative consult needed.  ? ?Transition of Care Department [facility]) has reviewed patient and no TOC needs have been identified at this time. We will continue to monitor patient advancement through interdisciplinary progression rounds. If new patient transition needs arise, please place a TOC consult. ? ? ?

## 2021-04-04 LAB — GLUCOSE, CAPILLARY
Glucose-Capillary: 107 mg/dL — ABNORMAL HIGH (ref 70–99)
Glucose-Capillary: 160 mg/dL — ABNORMAL HIGH (ref 70–99)

## 2021-04-04 LAB — BASIC METABOLIC PANEL
Anion gap: 7 (ref 5–15)
BUN: 14 mg/dL (ref 8–23)
CO2: 23 mmol/L (ref 22–32)
Calcium: 8.2 mg/dL — ABNORMAL LOW (ref 8.9–10.3)
Chloride: 108 mmol/L (ref 98–111)
Creatinine, Ser: 1.18 mg/dL (ref 0.61–1.24)
GFR, Estimated: 59 mL/min — ABNORMAL LOW (ref >60)
Glucose, Bld: 102 mg/dL — ABNORMAL HIGH (ref 70–99)
Potassium: 3.7 mmol/L (ref 3.5–5.1)
Sodium: 138 mmol/L (ref 135–145)

## 2021-04-04 MED ORDER — TRIMETHOPRIM 100 MG PO TABS: 100.0000 mg | ORAL_TABLET | Freq: Every day | ORAL | 1 refills | Status: DC

## 2021-04-04 MED ORDER — DICLOFENAC SODIUM 1 % EX GEL
4.0000 g | Freq: Three times a day (TID) | CUTANEOUS | Status: DC | PRN
  Filled 2021-04-04: qty 100

## 2021-04-04 MED ORDER — DICLOFENAC SODIUM 1 % EX GEL: 4.0000 g | Freq: Three times a day (TID) | CUTANEOUS | 0 refills | Status: AC | PRN

## 2021-04-04 MED ORDER — ACETAMINOPHEN 325 MG PO TABS
650.0000 mg | ORAL_TABLET | Freq: Four times a day (QID) | ORAL | Status: DC | PRN
  Administered 2021-04-04: 650 mg via ORAL
  Filled 2021-04-04: qty 2

## 2021-04-04 MED ORDER — CEFDINIR 300 MG PO CAPS: 300.0000 mg | ORAL_CAPSULE | Freq: Two times a day (BID) | ORAL | 0 refills | Status: AC

## 2021-04-04 NOTE — Plan of Care (Signed)

## 2021-04-04 NOTE — Evaluation (Signed)
Physical Therapy Evaluation ?Patient Details ?Name: LEVON BOETTCHER ?MRN: [id number] ?DOB: Apr 16, 1928 ?Today's Date: 04/04/2021 ? ?History of Present Illness ? JERMANY RIMEL is a 88 y.o. male with medical history significant of hypertension, hyperlipidemia, chronic kidney disease a stage IIIb, type 2 diabetes with nephropathy, BPH and prior history of a stroke with vascular dementia; who presented to the hospital secondary to generalized weakness and metabolic encephalopathy.  Patient reports symptoms present for the last 24 hours and worsening; this morning unable to get out of bed at his normal level to and transferring to his motor wheelchair.  He reported to be significantly weak generally speaking and at the moment that the EMS was activated was found to be with increased lethargy mumbling words and is slightly disoriented.  After arrival to the emergency department and receiving fluid resuscitation he started feeling better and reported no chest pain, no focal weakness, no nausea, no vomiting, no hematuria, no melena, no hematochezia, no chest pain, no shortness of breath, no abdominal pain. ?  ?Clinical Impression ? Patient functioning at baseline for functional mobility requiring Mod/max assist for sitting up at bedside with [facility] fully raised, unable to stand and non-ambulatory at baseline.  Plan:  Patient discharged from physical therapy to care of nursing for out of bed using mechanical lift as tolerated for length of stay.    ?   ? ?Recommendations for follow up therapy are one component of a multi-disciplinary discharge planning process, led by the attending physician.  Recommendations may be updated based on patient status, additional functional criteria and insurance authorization. ? ?Follow Up Recommendations No PT follow up ? ?  ?Assistance Recommended at Discharge Set up Supervision/Assistance  ?Patient can return home with the following ? Two people to help with walking and/or transfers;A lot of  help with bathing/dressing/bathroom;Help with stairs or ramp for entrance ? ?  ?Equipment Recommendations None recommended by PT  ?Recommendations for Other Services ?    ?  ?Functional Status Assessment Patient has had a recent decline in their functional status and/or demonstrates limited ability to make significant improvements in function in a reasonable and predictable amount of time  ? ?  ?Precautions / Restrictions Precautions ?Precautions: Fall ?Restrictions ?Weight Bearing Restrictions: No  ? ?  ? ?Mobility ? Bed Mobility ?Overal bed mobility: Needs Assistance ?Bed Mobility: Supine to Sit, Sit to Supine ?  ?  ?Supine to sit: HOB elevated, Mod assist, Max assist ?Sit to supine: Mod assist, Max assist ?  ?General bed mobility comments: required HOB raised to complete supine to sitting, limited use of RUE due to wrist pain ?  ? ?Transfers ?  ?  ?  ?  ?  ?  ?  ?  ?  ?  ?  ? ?Ambulation/Gait ?  ?  ?  ?  ?  ?  ?  ?  ? ?Stairs ?  ?  ?  ?  ?  ? ?Wheelchair Mobility ?  ? ?Modified Rankin (Stroke Patients Only) ?  ? ?  ? ?Balance Overall balance assessment: Needs assistance ?Sitting-balance support: Feet supported, No upper extremity supported ?Sitting balance-Leahy Scale: Fair ?Sitting balance - Comments: fair seated at EOB ?  ?  ?  ?  ?  ?  ?  ?  ?  ?  ?  ?  ?  ?  ?  ?   ? ? ? ?Pertinent Vitals/Pain Pain Assessment ?Pain Assessment: Faces ?Faces Pain Scale: Hurts little more ?Pain Location: right  wrist ?Pain Descriptors / Indicators: Sore, Guarding ?Pain Intervention(s): Limited activity within patient's tolerance, Monitored during session, Repositioned  ? ? ?Home Living Family/patient expects to be discharged to:: Private residence ?Living Arrangements: Alone ?Available Help at Discharge: Personal care attendant;Family;Available PRN/intermittently ?Type of Home: Apartment ?Home Access: Level entry ?  ?  ?  ?Home Layout: One level ?Home Equipment: Rolling Walker (2 wheels);BSC/3in1;Grab bars - tub/shower;Grab bars  - toilet;Electric scooter;Hospital bed;Other (comment) ?Additional Comments: uses overhead trapeze  ?  ?Prior Function Prior Level of Function : Needs assist ?  ?  ?  ?Physical Assist : Mobility (physical);ADLs (physical) ?Mobility (physical): Bed mobility;Transfers;Gait;Stairs ?  ?Mobility Comments: non-ambulatory, assisted for scooting over from bed to power scooter, uses overhead trapeze and assistance for bed mobility ?ADLs Comments: assisted by family, home aides 8 am to 12 pm 5 days/week ?  ? ? ?Hand Dominance  ? Dominant Hand: Right ? ?  ?Extremity/Trunk Assessment  ? Upper Extremity Assessment ?Upper Extremity Assessment: Generalized weakness ?  ? ?Lower Extremity Assessment ?Lower Extremity Assessment: Generalized weakness ?  ? ?Cervical / Trunk Assessment ?Cervical / Trunk Assessment: Normal  ?Communication  ? Communication: No difficulties  ?Cognition Arousal/Alertness: Awake/alert ?Behavior During Therapy: [facility] for tasks assessed/performed ?Overall Cognitive Status: Within Functional Limits for tasks assessed ?  ?  ?  ?  ?  ?  ?  ?  ?  ?  ?  ?  ?  ?  ?  ?  ?  ?  ?  ? ?  ?General Comments   ? ?  ?Exercises    ? ?Assessment/Plan  ?  ?PT Assessment Patient does not need any further PT services  ?PT Problem List Decreased strength;Decreased activity tolerance;Decreased balance;Decreased mobility ? ?   ?  ?PT Treatment Interventions     ? ?PT Goals (Current goals can be found in the Care Plan section)  ?Acute Rehab PT Goals ?Patient Stated Goal: return home with family and home aides to assist ?PT Goal Formulation: With patient ?Time For Goal Achievement: 04/04/21 ?Potential to Achieve Goals: Good ? ?  ?Frequency   ?  ? ? ?Co-evaluation   ?  ?  ?  ?  ? ? ?  ?AM-PAC PT "6 Clicks" Mobility  ?Outcome Measure Help needed turning from your back to your side while in a flat bed without using bedrails?: A Lot ?Help needed moving from lying on your back to sitting on the side of a flat bed without using bedrails?: A  Lot ?Help needed moving to and from a bed to a chair (including a wheelchair)?: Total ?Help needed standing up from a chair using your arms (e.g., wheelchair or bedside chair)?: Total ?Help needed to walk in hospital room?: Total ?Help needed climbing 3-5 steps with a railing? : Total ?6 Click Score: 8 ? ?  ?End of Session   ?Activity Tolerance: Patient tolerated treatment well;Patient limited by fatigue ?Patient left: in bed;with call bell/phone within reach (left seated at bedside with tray in front to lean on, HOB fully raised) ?Nurse Communication: Mobility status ?PT Visit Diagnosis: Unsteadiness on feet (R26.81);Other abnormalities of gait and mobility (R26.89);Muscle weakness (generalized) (M62.81) ?  ? ?Time: 0093-8182 ?PT Time Calculation (min) (ACUTE ONLY): 20 min ? ? ?Charges:   PT Evaluation ?$PT Eval Moderate Complexity: 1 Mod ?PT Treatments ?$Therapeutic Activity: 8-22 mins ?  ?   ? ? ?11:23 AM, 04/04/21 ?Lonell Grandchild, MPT ?Physical Therapist with [place] ?[facility] ?[phone] office ?[phone] mobile  phone ? ? ?

## 2021-04-04 NOTE — TOC Transition Note (Signed)
Transition of Care (TOC) - CM/SW Discharge Note ? ? ?Patient Details  ?Name: Mike Briggs ?MRN: [id number] ?Date of Birth: 10-27-1928 ? ?Transition of Care (TOC) CM/SW Contact:  ?Boneta Lucks, RN ?Phone Number: ?04/04/2021, 2:52 PM ? ? ?Clinical Narrative:   Patient discharging home. PT evaluation patient is at his baseline. Recommending HHPT. Patient is agreeable, states he has aides to assist at home. Referral sent to [facility] with Alvis Lemmings. MD ordering. ? ? ?Final next level of care: [place] ?Barriers to Discharge: Barriers Resolved ? ?Patient Goals and CMS Choice ?Patient states their goals for this hospitalization and ongoing recovery are:: home with home health ?CMS Medicare.gov Compare Post Acute Care list provided to:: Patient ?Choice offered to / list presented to : Patient ? ?Discharge Placement ?  ?  ?Patient and family notified of of transfer: 04/04/21 ? ?Discharge Plan and Services ?  ?   ?HH Arranged: PT ?[place] Agency: [place] ?Date HH Agency Contacted: 04/04/21 ?Time [place]: [id number] ?Representative spoke with at [place]: Georgina Snell ? ?Readmission Risk Interventions ?Readmission Risk Prevention Plan 03/25/2019  ?Transportation Screening Complete  ?[place] or Home Care Consult Complete  ?Social Work Consult for [place] Planning/Counseling Complete  ?Palliative Care Screening Not Applicable  ?Medication Review Press photographer) Complete  ?Some recent data might be hidden  ? ? ? ? ? ?

## 2021-04-04 NOTE — Care Management Important Message (Signed)
Important Message ? ?Patient Details  ?Name: Mike Briggs ?MRN: [id number] ?Date of Birth: December 31, 1928 ? ? ?Medicare Important Message Given:  N/A - LOS <3 / Initial given by admissions ? ? ? ? ?[name] ?04/04/2021, 12:16 PM ?

## 2021-04-04 NOTE — Discharge Summary (Addendum)
Physician Discharge Summary   Patient: Mike Briggs MRN: [id number] DOB: [birth year]  Admit date:     04/01/2021  Discharge date: 04/04/21  Discharge Physician: Vassie Loll   PCP: Anabel Halon, MD   Recommendations at discharge:  Repeat basic metabolic panel to follow ultralights and renal function Reassess complete resolution of patient's symptoms after completion of UTI therapy. Goals of care discussion and advance care management recommended.  Discharge Diagnoses: Principal Problem:   UTI (urinary tract infection) Active Problems:   Type 2 diabetes mellitus with diabetic chronic kidney disease (HCC)   Essential hypertension   BPH (benign prostatic hyperplasia)   Hyperlipidemia   Pressure injury of skin   Gastroesophageal reflux disease   Chronic kidney disease, stage 3b (HCC)   AKI (acute kidney injury) (HCC) Class I obesity  Hospital narrative admission course: Mike Briggs is a 88 y.o. male with medical history significant of hypertension, hyperlipidemia, chronic kidney disease a stage IIIb, type 2 diabetes with nephropathy, BPH and prior history of a stroke with vascular dementia; who presented to the hospital secondary to generalized weakness and metabolic encephalopathy.  Patient reports symptoms present for the last 24 hours and worsening; this morning unable to get out of bed at his normal level to and transferring to his motor wheelchair.  He reported to be significantly weak generally speaking and at the moment that the EMS was activated was found to be with increased lethargy mumbling words and is slightly disoriented.  After arrival to the emergency department and receiving fluid resuscitation he started feeling better and reported no chest pain, no focal weakness, no nausea, no vomiting, no hematuria, no melena, no hematochezia, no chest pain, no shortness of breath, no abdominal pain.   CT scan of the head demonstrated no acute cardiopulmonary process; COVID  PCR was negative and patient urinalysis suggested UTI.  Blood work specifically basic metabolic panel demonstrates acute kidney injury (creatinine 1.5-1) at baseline creatinine in the 1.1 range.  Stable electrolytes appreciated.  Flat elevation of his troponin and 19 without presence of chest pain as mentioned above.  Normal ammonia level.  Negative UDS.   IV fluids were provided, cultures of his urine retrieve and ceftriaxone has been started.  [facility] hospitalist has been called to place patient in the hospital for further evaluation and management.  Assessment and Plan: * UTI (urinary tract infection) - Patient with underlying history of chronic cystitis and also chronic use of Namenda which can increase chances for urinary tract infection and retention. -Prior cultures demonstrated the presence of Klebsiella sensitive to cephalosporin. -Patient has been discharged on cefdinir 300 mg twice a day and after completing acute therapy we pursued suppressive treatment with treatment with trimethoprim 100 mg daily. -Continue to maintain adequate hydration. -Namenda has been discontinue. -Patient follow-up with PCP in 10 days.  AKI (acute kidney injury) (HCC) -In the setting of prerenal azotemia, continue use of nephrotoxic agents and UTI. -Continue to maintain adequate hydration, avoid nephrotoxic agents, contrast and hypotension. -Follow renal function trend/stability -Continue to treat UTI.   Chronic kidney disease, stage 3b (HCC) -With mild acute kidney injury component due to continued use of nephrotoxic agents and UTI. -Continue to maintain adequate hydration -Complete treatment for UTI -Repeat basic metabolic panel to follow electrolytes and renal function trend.   Gastroesophageal reflux disease -Continue PPI. -Lifestyle changes discussed with patient.  Pressure injury of skin -Patient with stage II pressure injury present at time of admission. -Continue preventive measures and  constant repositioning. -Essentially at baseline patient is bedbound and using motor wheelchair. -No signs of superimposed infection.  Hyperlipidemia -Continue statins. -Heart healthy diet encouraged.  BPH (benign prostatic hyperplasia) -Continue the use of Flomax.  Essential hypertension -Overall stable -Safe to resume ACE inhibitor's at time of discharge -Continue the use of Norvasc -Heart healthy diet encouraged. -Reassess blood pressure at follow-up visit.   Type 2 diabetes mellitus with diabetic chronic kidney disease ([place]) -Recent A1c (February 2023) 5.8. -Modified carbohydrate diet has been ordered. -Educated about not skipping meals. -Resume home hypoglycemic regimen.  Class I obesity -Low calorie diet and portion control discussed with patient. -Body mass index is 31.46 kg/m.  Consultants: None Procedures performed: See below for x-ray reports. Disposition: Home with home health services. Diet recommendation: Heart healthy modified carbohydrate diet.  DISCHARGE MEDICATION: Allergies as of 04/04/2021   No Known Allergies      Medication List     STOP taking these medications    memantine 10 MG tablet Commonly known as: NAMENDA   mupirocin cream 2 % Commonly known as: BACTROBAN       TAKE these medications    amLODipine 10 MG tablet Commonly known as: NORVASC TAKE ONE TABLET BY MOUTH ONCE DAILY   aspirin 81 MG chewable tablet Chew 1 tablet (81 mg total) by mouth daily with breakfast.   atorvastatin 10 MG tablet Commonly known as: LIPITOR TAKE 1 TABLET BY MOUTH AFTER SUPPER.   blood glucose meter kit and supplies Dispense based on patient and insurance preference. Use up to four times daily as directed. (FOR ICD-10 E10.9, E11.9).   cefdinir 300 MG capsule Commonly known as: OMNICEF Take 1 capsule (300 mg total) by mouth 2 (two) times daily for 3 days.   diclofenac Sodium 1 % Gel Commonly known as: VOLTAREN Apply 4 g topically 3 (three)  times daily as needed (arthritic wrist pain).   Lantus SoloStar 100 UNIT/ML Solostar Pen Generic drug: insulin glargine Inject 20 Units into the skin daily.   lisinopril 40 MG tablet Commonly known as: ZESTRIL TAKE (1) TABLET BY MOUTH ONCE DAILY.   pantoprazole 40 MG tablet Commonly known as: PROTONIX Take 1 tablet (40 mg total) by mouth daily.   Sure Comfort Pen Needles 31G X 5 MM Misc Generic drug: Insulin Pen Needle USE TO INJECT AS DIRECTED FOUR TIMES A DAY. WITH MEALS AND BEFORE BEDTIME.   tamsulosin 0.4 MG Caps capsule Commonly known as: FLOMAX TAKE 1 CAPSULE BY MOUTH DAILY AFTER BREAKFAST.   trimethoprim 100 MG tablet Commonly known as: TRIMPEX Take 1 tablet (100 mg total) by mouth daily. Start taking on: April 08, 2021        Follow-up Information     [name], MD. Schedule an appointment as soon as possible for a visit in 10 day(s).   Specialty: Internal Medicine Contact information: [address] [place] [address] [phone]         Care, [facility] Follow up.   Specialty: Home Health Services Why: PT will call to schedule you first home visit. Contact information: [place] [address] [phone]                Discharge Exam: Danley Danker Weights   04/01/21 0707  Weight: 102.3 kg   General exam: Alert, awake, oriented x 3; patient reports feeling significantly better and ready to go home.  No chest pain, no nausea, no vomiting, no dysuria.  No fever Respiratory  system: Clear to auscultation. Respiratory effort normal.  Good saturation on room air.  No using accessory muscle. Cardiovascular system:RRR. No murmurs, rubs, gallops.  No JVD. Gastrointestinal system: Abdomen is nondistended, soft and nontender. No organomegaly or masses felt. Normal bowel sounds heard. Central nervous system: No new focal neurological deficits. Extremities: No cyanosis or clubbing. Skin: Unchanged stage II  bilateral buttocks pressure injury without signs of superimposed infection (present on admission). Psychiatry: Judgement and insight appear normal. Mood & affect appropriate.   Condition at discharge: Stable and improved.  The results of significant diagnostics from this hospitalization (including imaging, microbiology, ancillary and laboratory) are listed below for reference.   Imaging Studies: CT HEAD WO CONTRAST  Result Date: 04/01/2021 CLINICAL DATA:  88 year old male acute neurologic deficit. Weakness and slurred speech. EXAM: CT HEAD WITHOUT CONTRAST TECHNIQUE: Contiguous axial images were obtained from the base of the skull through the vertex without intravenous contrast. RADIATION DOSE REDUCTION: This exam was performed according to the departmental dose-optimization program which includes automated exposure control, adjustment of the mA and/or kV according to patient size and/or use of iterative reconstruction technique. COMPARISON:  Brain MRI 11/14/2018. Head CT 03/12/2021. FINDINGS: Brain: Stable cerebral volume. No midline shift, mass effect, or evidence of intracranial mass lesion. No ventriculomegaly. Patchy and confluent bilateral cerebral white matter hypodensity appears stable. Stable gray-white matter differentiation throughout the brain. No cortically based acute infarct identified. No acute intracranial hemorrhage identified. ASPECTS 10. no cortical encephalomalacia identified. Vascular: Calcified atherosclerosis at the skull base. No suspicious intracranial vascular hyperdensity. Skull: No acute osseous abnormality identified. Sinuses/Orbits: Chronic right sphenoid sinus mucosal thickening. Other Visualized paranasal sinuses and mastoids are stable and well aerated. Other: No acute orbit or scalp soft tissue finding. IMPRESSION: Stable non contrast CT appearance of the brain. No acute cortically based infarct or acute intracranial hemorrhage identified. ASPECTS 10. Electronically Signed    By: Genevie Ann M.D.   On: 04/01/2021 08:05   CT HEAD WO CONTRAST  Result Date: 03/12/2021 CLINICAL DATA:  Mental status change, unknown cause altered EXAM: CT HEAD WITHOUT CONTRAST TECHNIQUE: Contiguous axial images were obtained from the base of the skull through the vertex without intravenous contrast. RADIATION DOSE REDUCTION: This exam was performed according to the departmental dose-optimization program which includes automated exposure control, adjustment of the mA and/or kV according to patient size and/or use of iterative reconstruction technique. COMPARISON:  09/16/2019 FINDINGS: Brain: There is atrophy and chronic small vessel disease changes. No acute intracranial abnormality. Specifically, no hemorrhage, hydrocephalus, mass lesion, acute infarction, or significant intracranial injury. Vascular: No hyperdense vessel or unexpected calcification. Skull: No acute calvarial abnormality. Sinuses/Orbits: No acute findings Other: None IMPRESSION: Atrophy, chronic microvascular disease. No acute intracranial abnormality. Electronically Signed   By: Rolm Baptise M.D.   On: 03/12/2021 23:33   DG Chest Port 1 View  Result Date: 03/12/2021 CLINICAL DATA:  Altered mental status.  History of diabetes. EXAM: PORTABLE CHEST 1 VIEW COMPARISON:  02/07/2021 FINDINGS: Artifact overlies the chest. Poor inspiration. Allowing for that, no active disease is identified. No evidence of consolidation, collapse or effusion. IMPRESSION: Poor inspiration. Overlying artifact. No active disease identified. Electronically Signed   By: Nelson Chimes M.D.   On: 03/12/2021 23:30    Microbiology: Results for orders placed or performed during the hospital encounter of 04/01/21  Resp Panel by RT-PCR (Flu A&B, Covid) Nasopharyngeal Swab     Status: None   Collection Time: 04/01/21  7:15 AM   Specimen: Nasopharyngeal Swab;  Nasopharyngeal(NP) swabs in vial transport medium  Result Value Ref Range Status   SARS Coronavirus 2 by RT  PCR NEGATIVE NEGATIVE Final    Comment: (NOTE) SARS-CoV-2 target nucleic acids are NOT DETECTED.  The SARS-CoV-2 RNA is generally detectable in upper respiratory specimens during the acute phase of infection. The lowest concentration of SARS-CoV-2 viral copies this assay can detect is 138 copies/mL. A negative result does not preclude SARS-Cov-2 infection and should not be used as the sole basis for treatment or other patient management decisions. A negative result may occur with  improper specimen collection/handling, submission of specimen other than nasopharyngeal swab, presence of viral mutation(s) within the areas targeted by this assay, and inadequate number of viral copies(<138 copies/mL). A negative result must be combined with clinical observations, patient history, and epidemiological information. The expected result is Negative.  Fact Sheet for Patients:  EntrepreneurPulse.com.au  Fact Sheet for Healthcare Providers:  IncredibleEmployment.be  This test is no t yet approved or cleared by the [place] FDA and  has been authorized for detection and/or diagnosis of SARS-CoV-2 by FDA under an Emergency Use Authorization (EUA). This EUA will remain  in effect (meaning this test can be used) for the duration of the COVID-19 declaration under Section 564(b)(1) of the Act, 21 U.S.C.section 360bbb-3(b)(1), unless the authorization is terminated  or revoked sooner.       Influenza A by PCR NEGATIVE NEGATIVE Final   Influenza B by PCR NEGATIVE NEGATIVE Final    Comment: (NOTE) The Xpert Xpress SARS-CoV-2/FLU/RSV plus assay is intended as an aid in the diagnosis of influenza from Nasopharyngeal swab specimens and should not be used as a sole basis for treatment. Nasal washings and aspirates are unacceptable for Xpert Xpress SARS-CoV-2/FLU/RSV testing.  Fact Sheet for Patients: EntrepreneurPulse.com.au  Fact Sheet for  Healthcare Providers: IncredibleEmployment.be  This test is not yet approved or cleared by the [place] FDA and has been authorized for detection and/or diagnosis of SARS-CoV-2 by FDA under an Emergency Use Authorization (EUA). This EUA will remain in effect (meaning this test can be used) for the duration of the COVID-19 declaration under Section 564(b)(1) of the Act, 21 U.S.C. section 360bbb-3(b)(1), unless the authorization is terminated or revoked.  Performed at [facility], [address], [place],  [address]     Labs: CBC: Recent Labs  Lab 04/01/21 0721 04/03/21 0430  WBC 9.8 9.1  NEUTROABS 6.8  --   HGB 10.5* 8.6*  HCT 35.4* 29.3*  MCV 91.7 92.1  PLT 357 549   Basic Metabolic Panel: Recent Labs  Lab 04/01/21 0721 04/03/21 0430 04/04/21 0501  NA 140 139 138  K 3.7 4.0 3.7  CL 106 109 108  CO2 _0 GLUCOSE 126* 79 102*  BUN _1 CREATININE 1.51* 1.17 1.18  CALCIUM 8.5* 8.0* 8.2*   Liver Function Tests: Recent Labs  Lab 04/01/21 0721  AST 15  ALT 11  ALKPHOS 116  BILITOT 0.5  PROT 7.0  ALBUMIN 2.5*   CBG: Recent Labs  Lab 04/03/21 1119 04/03/21 1655 04/03/21 2144 04/04/21 0736 04/04/21 1130  GLUCAP 113* 106* 118* 107* 160*    Discharge time spent: greater than 30 minutes.  Signed: Barton Dubois, MD [facility] 04/04/2021

## 2021-04-05 ENCOUNTER — Telehealth: Payer: Self-pay | Admitting: *Deleted

## 2021-04-05 NOTE — Telephone Encounter (Signed)
Transition Care Management Unsuccessful Follow-up Telephone Call ?Date of discharge and from where:  04-04-21 [facility] ? ?Attempts:  2nd Attempt ? ?Reason for unsuccessful TCM follow-up call:  Missing or invalid number ? ? ? ?

## 2021-04-05 NOTE — Telephone Encounter (Signed)
Transition Care Management Unsuccessful Follow-up Telephone Call ? ?Date of discharge and from where:  04-04-21 [facility] ? ?Attempts:  1st Attempt ? ?Reason for unsuccessful TCM follow-up call:  Missing or invalid number ? ? ? ?

## 2021-04-05 NOTE — Telephone Encounter (Signed)
Transition Care Management Unsuccessful Follow-up Telephone Call ? ?Date of discharge and from where:  04-04-21 [facility]  ? ?Attempts:  3rd Attempt ? ?Reason for unsuccessful TCM follow-up call:  Missing or invalid number ? ? ? ?

## 2021-04-19 ENCOUNTER — Telehealth: Payer: Self-pay | Admitting: *Deleted

## 2021-04-19 ENCOUNTER — Other Ambulatory Visit: Payer: Self-pay | Admitting: Internal Medicine

## 2021-04-19 ENCOUNTER — Telehealth: Payer: Self-pay | Admitting: Internal Medicine

## 2021-04-19 DIAGNOSIS — I10 Essential (primary) hypertension: Secondary | ICD-10-CM

## 2021-04-19 MED ORDER — LISINOPRIL 40 MG PO TABS: 40.0000 mg | ORAL_TABLET | Freq: Every day | ORAL | 5 refills | Status: DC

## 2021-04-19 NOTE — Chronic Care Management (AMB) (Signed)
?  Care Management  ? ?Note ? ?04/19/2021 ?Name: SEVERN GODDARD MRN: [id number] DOB: 11/08/1928 ? ?ROSHAD HACK is a 88 y.o. year old male who is a primary care patient of [name], MD and is actively engaged with the care management team. I reached out to [name] by phone today to assist with re-scheduling a follow up visit with the RN Case Manager ? ?Follow up plan: ?Unsuccessful telephone outreach attempt made. A HIPAA compliant phone message was left for the patient providing contact information and requesting a return call.  ?The care management team will reach out to the patient again over the next 7 days.  ?If patient returns call to provider office, please advise to call [place] at [phone]. ? ?Laverda Sorenson  ?Care Guide, Embedded Care Coordination ?  Care Management  ?Direct Dial: [phone] ? ?

## 2021-04-19 NOTE — Telephone Encounter (Signed)
[facility]  ? Called in on patient behalf  ? ?Hosiptal DC summary from 3/7 ?States that patient is taking LISINOPRIL ?But patient is not taking and med is not on dose pack from pharm.  ? ? ?Memantine 10 mg  was stopped according to Dc summary but patient is still taking med  ? ?Santiago Glad would like clarification on what patient should be taking  ? ?81mg  Asprin not on DC summary but patient is taking med ? ?Call back info . ?Santiago Glad w. [facility] [place]  ?[phone] ? ? ?Patient also  needs orders for slide board and new bed.  ? ? ? ?

## 2021-04-20 NOTE — Telephone Encounter (Signed)
Spoke with Santiago Glad advised of changes updated med list faxed to her  ?

## 2021-04-20 NOTE — Telephone Encounter (Signed)
LVM for Mike Briggs to call the office  ?

## 2021-04-25 ENCOUNTER — Encounter: Payer: Self-pay | Admitting: Internal Medicine

## 2021-04-25 ENCOUNTER — Other Ambulatory Visit: Payer: Self-pay | Admitting: Urology

## 2021-04-25 ENCOUNTER — Ambulatory Visit: Payer: Medicare HMO

## 2021-04-25 ENCOUNTER — Other Ambulatory Visit: Payer: Self-pay

## 2021-04-25 ENCOUNTER — Ambulatory Visit (INDEPENDENT_AMBULATORY_CARE_PROVIDER_SITE_OTHER): Payer: Medicare HMO | Admitting: Internal Medicine

## 2021-04-25 DIAGNOSIS — I69351 Hemiplegia and hemiparesis following cerebral infarction affecting right dominant side: Secondary | ICD-10-CM | POA: Diagnosis not present

## 2021-04-25 DIAGNOSIS — M130 Polyarthritis, unspecified: Secondary | ICD-10-CM

## 2021-04-25 DIAGNOSIS — N390 Urinary tract infection, site not specified: Secondary | ICD-10-CM | POA: Diagnosis not present

## 2021-04-25 DIAGNOSIS — N3020 Other chronic cystitis without hematuria: Secondary | ICD-10-CM

## 2021-04-25 DIAGNOSIS — R339 Retention of urine, unspecified: Secondary | ICD-10-CM

## 2021-04-25 NOTE — Assessment & Plan Note (Signed)
Chronic wrist, ankle and knee pain ?Was on Mobic and Gabapentin, discontinued since recent hospitalization ?Uses Voltaren gel PRN ? ? ?

## 2021-04-25 NOTE — Progress Notes (Signed)
?  ? ?Virtual Visit via Telephone Note  ? ?This visit type was conducted due to national recommendations for restrictions regarding the COVID-19 Pandemic (e.g. social distancing) in an effort to limit this patient's exposure and mitigate transmission in our community.  Due to his co-morbid illnesses, this patient is at least at moderate risk for complications without adequate follow up.  This format is felt to be most appropriate for this patient at this time.  The patient did not have access to video technology/had technical difficulties with video requiring transitioning to audio format only (telephone).  All issues noted in this document were discussed and addressed.  No physical exam could be performed with this format. ? ?Evaluation Performed:  Follow-up visit ? ?Date:  04/25/2021  ? ?ID:  Mike Briggs, DOB 1928/09/09, MRN [id number] ? ?Patient Location: Home ?Provider Location: Office/Clinic ? ?Participants: Patient ?Location of Patient: Home ?Location of Provider: Telehealth ?Consent was obtain for visit to be over via telehealth. ?I verified that I am speaking with the correct person using two identifiers. ? ?PCP:  [name], MD  ? ?Chief Complaint: Recent episode of UTI and wrist pain ? ?History of Present Illness:   ? ?Mike Briggs is a 88 y.o. male who has a televisit for follow-up after recent UTI, leading to acute encephalopathy.  He has been doing better since being discharged from the hospital on 03/07.  He currently does not have any fever or chills. His nurse reports that he is not complaining of dysuria.  He does not have foul-smelling urine or hematuria. ? ?He still complains of chronic wrist pain, for which he uses Voltaren gel.  He has history of chronic lymphedema, which is likely due to his posture and inability to move RUE due to stroke. ? ?The patient does not have symptoms concerning for COVID-19 infection (fever, chills, cough, or new shortness of breath).  ? ?Past Medical,  Surgical, Social History, Allergies, and Medications have been Reviewed. ? ?Past Medical History:  ?Diagnosis Date  ? Acute cystitis 12/17/2018  ? Acute encephalopathy 11/14/2018  ? Acute metabolic encephalopathy 0/81/4481  ? ANKLE, ARTHRITIS, DEGEN./OSTEO 12/16/2008  ? Qualifier: Diagnosis of  By: Aline Brochure MD, Dorothyann Peng    ? Arthritis   ? Cataract   ? Chronic kidney disease   ? COVID-19   ? COVID-19 virus detected 11/03/2018  ? COVID-19 virus infection 11/03/2018  ? Dehydration 11/02/2018  ? Depression   ? Diabetes mellitus   ? Gram-positive bacteremia 11/03/2018  ? Hyperlipidemia   ? Hypertension   ? Hypomagnesemia 03/24/2019  ? Klebsiella UTI  03/25/2019  ? Pain in finger of right hand 05/27/2019  ? Personal history of gout 05/22/2016  ? Rhabdomyolysis 11/02/2018  ? Sepsis secondary to UTI ([place]) 03/13/2021  ? Stroke [facility])   ? Swelling of both hands 05/27/2019  ? Syncope 02/25/2018  ? TIA (transient ischemic attack) 11/14/2018  ? UTI (urinary tract infection) 02/26/2018  ? Vitamin D deficiency 05/31/2016  ? ?Past Surgical History:  ?Procedure Laterality Date  ? BACK SURGERY    ? cervical and lumbar  ? CATARACT EXTRACTION    ? CHOLECYSTECTOMY    ? FOOT SURGERY    ? 5th toe   ? SPINE SURGERY    ? lumbar and cervical fusions  ?  ? ?Current Meds  ?Medication Sig  ? amLODipine (NORVASC) 10 MG tablet TAKE ONE TABLET BY MOUTH ONCE DAILY  ? aspirin 81 MG chewable tablet Chew 1 tablet (81 mg  total) by mouth daily with breakfast.  ? atorvastatin (LIPITOR) 10 MG tablet TAKE 1 TABLET BY MOUTH AFTER SUPPER.  ? blood glucose meter kit and supplies Dispense based on patient and insurance preference. Use up to four times daily as directed. (FOR ICD-10 E10.9, E11.9).  ? diclofenac Sodium (VOLTAREN) 1 % GEL Apply 4 g topically 3 (three) times daily as needed (arthritic wrist pain).  ? LANTUS SOLOSTAR 100 UNIT/ML Solostar Pen Inject 20 Units into the skin daily.  ? lisinopril (ZESTRIL) 40 MG tablet Take 1 tablet (40 mg total) by mouth daily.  ?  pantoprazole (PROTONIX) 40 MG tablet Take 1 tablet (40 mg total) by mouth daily.  ? SURE COMFORT PEN NEEDLES 31G X 5 MM MISC USE TO INJECT AS DIRECTED FOUR TIMES A DAY. WITH MEALS AND BEFORE BEDTIME.  ? tamsulosin (FLOMAX) 0.4 MG CAPS capsule TAKE 1 CAPSULE BY MOUTH DAILY AFTER BREAKFAST.  ? trimethoprim (TRIMPEX) 100 MG tablet Take 1 tablet (100 mg total) by mouth daily.  ?  ? ?Allergies:   Patient has no known allergies.  ? ?ROS:   ?Please see the history of present illness.    ? ?All other systems reviewed and are negative. ? ? ?Labs/Other Tests and Data Reviewed:   ? ?Recent Labs: ?02/07/2021: B Natriuretic Peptide 160.0 ?03/13/2021: TSH 0.921 ?03/16/2021: Magnesium 1.8 ?04/01/2021: ALT 11 ?04/03/2021: Hemoglobin 8.6; Platelets 309 ?04/04/2021: BUN 14; Creatinine, Ser 1.18; Potassium 3.7; Sodium 138  ? ?Recent Lipid Panel ?Lab Results  ?Component Value Date/Time  ? CHOL 138 12/06/2020 03:45 PM  ? TRIG 93 12/06/2020 03:45 PM  ? HDL 37 (L) 12/06/2020 03:45 PM  ? CHOLHDL 3.7 12/06/2020 03:45 PM  ? CHOLHDL 5.2 11/14/2018 06:21 AM  ? LDLCALC 83 12/06/2020 03:45 PM  ? LDLCALC 92 09/10/2017 10:37 AM  ? ? ?Wt Readings from Last 3 Encounters:  ?04/01/21 225 lb 8.5 oz (102.3 kg)  ?03/15/21 225 lb 8.5 oz (102.3 kg)  ?02/24/21 230 lb (104.3 kg)  ?  ? ?ASSESSMENT & PLAN:   ? ?Chronic cystitis ?Was recently admitted in the hospital for UTI leading to acute encephalopathy -now better ?We will check UA with reflex culture ?Continue trimethoprim ?Follow-up with urology ? ?Polyarthritis ?Chronic wrist, ankle and knee pain ?Was on Mobic and Gabapentin, discontinued since recent hospitalization ?Uses Voltaren gel PRN ? ? ? ? ? ?Time:   ?Today, I have spent 13 minutes reviewing the chart, including problem list, medications, and with the patient with telehealth technology discussing the above problems. ? ? ?Medication Adjustments/Labs and Tests Ordered: ?Current medicines are reviewed at length with the patient today.  Concerns regarding  medicines are outlined above.  ? ?Tests Ordered: ?No orders of the defined types were placed in this encounter. ? ? ?Medication Changes: ?No orders of the defined types were placed in this encounter. ? ? ? ?Note: This dictation was prepared with Dragon dictation along with smaller phrase technology. Similar sounding words can be transcribed inadequately or may not be corrected upon review. Any transcriptional errors that result from this process are unintentional.  ?  ? ? ?Disposition:  Follow up  ?Signed, ?[name], MD  ?04/25/2021 12:02 PM    ? ?[facility] ?[facility] ?

## 2021-04-25 NOTE — Assessment & Plan Note (Signed)
Was recently admitted in the hospital for UTI leading to acute encephalopathy -now better ?We will check UA with reflex culture ?Continue trimethoprim ?Follow-up with urology ?

## 2021-04-25 NOTE — Patient Instructions (Addendum)
Please continue taking medications as prescribed. ? ?Please bring urine sample to office today for testing. ?

## 2021-04-26 ENCOUNTER — Other Ambulatory Visit: Payer: Self-pay | Admitting: Internal Medicine

## 2021-04-26 DIAGNOSIS — N390 Urinary tract infection, site not specified: Secondary | ICD-10-CM | POA: Diagnosis not present

## 2021-04-26 LAB — POCT URINALYSIS DIP (CLINITEK)
Bilirubin, UA: NEGATIVE
Glucose, UA: NEGATIVE mg/dL
Ketones, POC UA: NEGATIVE mg/dL
Nitrite, UA: NEGATIVE
POC PROTEIN,UA: >=300 — AB
Spec Grav, UA: 1.025 (ref 1.010–1.025)
Urobilinogen, UA: 1 E.U./dL
pH, UA: 6 (ref 5.0–8.0)

## 2021-04-26 NOTE — Addendum Note (Signed)
Addended by: Bolivar Haw on: 04/26/2021 11:10 AM ? ? Modules accepted: Orders ? ?

## 2021-04-26 NOTE — Chronic Care Management (AMB) (Signed)
?  Care Management  ? ?Note ? ?04/26/2021 ?Name: Mike Briggs MRN: [id number] DOB: [birth year] ? ?Mike Briggs is a 88 y.o. year old male who is a primary care patient of [name], MD and is actively engaged with the care management team. I reached out to [name] by phone today to assist with re-scheduling an initial visit with the RN Case Manager ? ?Follow up plan: ?Telephone appointment with care management team member scheduled for:05/09/21 ? ?Laverda Sorenson  ?Care Guide, Embedded Care Coordination ?[facility]  Care Management  ?Direct Dial: [phone] ? ?

## 2021-04-26 NOTE — Addendum Note (Signed)
Addended by: Bolivar Haw on: 04/26/2021 11:09 AM ? ? Modules accepted: Orders ? ?

## 2021-04-28 ENCOUNTER — Ambulatory Visit (INDEPENDENT_AMBULATORY_CARE_PROVIDER_SITE_OTHER): Payer: Medicare HMO | Admitting: Licensed Clinical Social Worker

## 2021-04-28 DIAGNOSIS — F01B0 Vascular dementia, moderate, without behavioral disturbance, psychotic disturbance, mood disturbance, and anxiety: Secondary | ICD-10-CM

## 2021-04-28 DIAGNOSIS — D649 Anemia, unspecified: Secondary | ICD-10-CM

## 2021-04-28 DIAGNOSIS — I10 Essential (primary) hypertension: Secondary | ICD-10-CM

## 2021-04-28 DIAGNOSIS — E1122 Type 2 diabetes mellitus with diabetic chronic kidney disease: Secondary | ICD-10-CM

## 2021-04-28 DIAGNOSIS — N183 Chronic kidney disease, stage 3 unspecified: Secondary | ICD-10-CM

## 2021-04-28 NOTE — Patient Instructions (Addendum)
Visit Information ? ?Patient Goals:  Coping Skills Enhanced. Complete ADLs daily as able ? ?Time Frame:  Short Term Goal ?Priority: Medium ?Progress:  On Track ? ?Start Date:  03/28/21 ? ?End Date:  07/25/21   ? ?Follow Up Date:  06/23/21 at 2:00 PM ? ?Coping Skills Enhanced. Complete ADLs daily as able ? ?Patient Coping Skills: ? ?Has strong family support ?Takes medications as prescribed. ?Has in home support with home health aides daily  ? ?Patient Deficits: ? ?Mobility challenges ?Needs help with ADLs ? ?Patient Goals: ? ?Attend scheduled medical appointments in next 30 days ?Discuss his needs regularly with POA, [name] ? ?Follow Up Plan: LCSW to call client or POA, [place]a Retana on 06/23/21 at 2:00 PM to assess client needs  ? ?Norva Riffle.Alwilda Gilland MSW, LCSW ?Licensed Clinical Social Worker ?[facility] ?[phone] ?

## 2021-04-28 NOTE — Chronic Care Management (AMB) (Signed)
?Chronic Care Management  ? ? Clinical Social Work Note ? ?04/28/2021 ?Name: Mike Briggs MRN: [id number] DOB: Aug 23, 1928 ? ?Mike Briggs is a 88 y.o. year old male who is a primary care patient of [name], MD. The CCM team was consulted to assist the patient with chronic disease management and/or care coordination needs related to: [employer] .  ? ?Engaged with patient by telephone for follow up visit in response to provider referral for social work chronic care management and care coordination services.  ? ?Consent to Services:  ?The patient was given information about Chronic Care Management services, agreed to services, and gave verbal consent prior to initiation of services.  Please see initial visit note for detailed documentation.  ? ?Patient agreed to services and consent obtained.  ? ?Assessment: Review of patient past medical history, allergies, medications, and health status, including review of relevant consultants reports was performed today as part of a comprehensive evaluation and provision of chronic care management and care coordination services.    ? ?SDOH (Social Determinants of Health) assessments and interventions performed:  ?SDOH Interventions   ? ?Flowsheet Row Most Recent Value  ?SDOH Interventions   ?Physical Activity Interventions Other (Comments)  [uses a motorized wheelchair to ambulate]  ?Stress Interventions Other (Comment)  [client has stress related to transfers. He uses a motorized wheelchair to ambulate He wants to talk with MD about getting a new hospital bed. He is having trouble with current hospital bed]  ?Depression Interventions/Treatment  Counseling  ? ?  ?  ? ?Advanced Directives Status: See [employer] application for related entries. ? ?CCM Care Plan ? ?No Known Allergies ? ?Outpatient Encounter Medications as of 04/28/2021  ?Medication Sig Note  ? amLODipine (NORVASC) 10 MG tablet TAKE ONE TABLET BY MOUTH ONCE DAILY 04/03/2021: 03/28/2021 10 MG TABS (disp  30, 30d supply)   ? aspirin 81 MG chewable tablet Chew 1 tablet (81 mg total) by mouth daily with breakfast.   ? atorvastatin (LIPITOR) 10 MG tablet TAKE 1 TABLET BY MOUTH AFTER SUPPER. 04/03/2021: 03/28/2021 10 MG TABS (disp 30, 30d supply)   ? blood glucose meter kit and supplies Dispense based on patient and insurance preference. Use up to four times daily as directed. (FOR ICD-10 E10.9, E11.9).   ? diclofenac Sodium (VOLTAREN) 1 % GEL Apply 4 g topically 3 (three) times daily as needed (arthritic wrist pain).   ? LANTUS SOLOSTAR 100 UNIT/ML Solostar Pen Inject 20 Units into the skin daily. 03/13/2021: LF:01/18/2021 DS:75   ? lisinopril (ZESTRIL) 40 MG tablet Take 1 tablet (40 mg total) by mouth daily.   ? pantoprazole (PROTONIX) 40 MG tablet TAKE ONE TABLET BY MOUTH ONCE DAILY.   ? SURE COMFORT PEN NEEDLES 31G X 5 MM MISC USE TO INJECT AS DIRECTED FOUR TIMES A DAY. WITH MEALS AND BEFORE BEDTIME.   ? tamsulosin (FLOMAX) 0.4 MG CAPS capsule TAKE 1 CAPSULE BY MOUTH DAILY AFTER BREAKFAST.   ? trimethoprim (TRIMPEX) 100 MG tablet Take 1 tablet (100 mg total) by mouth daily.   ? ?No facility-administered encounter medications on file as of 04/28/2021.  ? ? ?Patient Active Problem List  ? Diagnosis Date Noted  ? Chronic cystitis 04/25/2021  ? Hospital discharge follow-up 03/21/2021  ? Physical deconditioning 03/14/2021  ? UTI (urinary tract infection) 03/13/2021  ? Dementia without behavioral disturbance ([place]) 03/13/2021  ? Chronic kidney disease, stage 3b ([place]) 03/13/2021  ? Encounter for examination following treatment at hospital 03/08/2021  ? Dysphagia   ?  Gastroesophageal reflux disease   ? Chest pain 01/26/2021  ? Foot ulcer ([place]) 11/24/2020  ? PVD (peripheral vascular disease) ([place]) 11/24/2020  ? Polyarthritis 12/10/2019  ? Hydrocele 05/27/2019  ? Mobitz type 1 second degree atrioventricular block   ? Sacral decubitus ulcer, stage III ([place]) 11/11/2018  ? Pressure injury of skin 11/08/2018  ? Essential hypertension  05/22/2016  ? Hemiparesis affecting right side as late effect of cerebrovascular accident (CVA) ([place]) 05/22/2016  ? BPH (benign prostatic hyperplasia) 05/22/2016  ? Chronic constipation 05/22/2016  ? Hyperlipidemia 05/22/2016  ? Pseudophakia 12/06/2015  ? Vascular dementia ([place]) 10/23/2012  ? Type 2 diabetes mellitus with diabetic chronic kidney disease ([place]) 12/16/2008  ? ? ?Conditions to be addressed/monitored: monitor client completion of daily activities. Monitor client use of coping skills to help him function daily ? ?Care Plan : LCSW care plan  ?Updates made by Katha Cabal, LCSW since 04/28/2021 12:00 AM  ?  ? ?Problem: Coping Skills (General Plan of Care)   ?  ? ?Goal: Coping Skills Enhanced. Complete ADLs as able   ?Start Date: 04/28/2021  ?Expected End Date: 07/26/2021  ?This Visit's Progress: On track  ?Recent Progress: On track  ?Priority: Medium  ?Note:   ?Current barriers:   ?Patient in need of assistance with connecting to community resources for ADLs support ?Acknowledges deficits with meeting this unmet need ?Mobility. He uses motorized wheelchair ?Appetite is decreased occasionally ?Difficulty with transfers ? ?Clinical Goals:  ?Client or POA will talk with SW monthly to discuss coping skills of client and his daily needs ?Client or POA to talk with SW monthly about in home support of client ?Client to attend scheduled medical appointments ?Client or POA to call RNCM as needed to discuss nursing needs of client ? ?Clinical Interventions:  ?Collaboration with [name], MD regarding development and update of comprehensive plan of care as evidenced by provider attestation and co-signature ?Assessment of needs, barriers of client ?Discussed client needs with client ?Reviewed sleeping issues of client. Reviewed appetite of client , Reviewed pain issues of client. Client did speak of pain in his wrist ?Reviewed family support for client. Client has support from his niece, Mike Briggs, [place], .   Niece, Mike Briggs ,is also supportive of client ?Discussed transport needs of client with Fermin Schwab. He said sometimes he uses [facility] for transport help. He said that sometimes his home health aid transports him to and from his medical appointments ?Discussed in home support for client. Client has home health aide support for client scheduled daily for 7 days per week ?Discussed relaxation techniques (likes Blues music, plays checker, watches TV) ?Encouraged client or [name], niece, to call RNCM as needed for nursing support for client ?Provided counseling support for Roylee ?Discussed energy of client. Client said he is often weak or fatigued ? ?Patient Coping Skills: ? ?Has strong family support ?Takes medications as prescribed. ?Has in home support with home health aides daily  ? ?Patient Deficits: ? ?Mobility challenges ?Needs help with ADLs ? ?Patient Goals: ? ?Attend scheduled medical appointments in next 30 days ?Discuss his needs regularly with POA, [name] ? ?Follow Up Plan: LCSW to call client or POA, Rondle Lohse on 06/23/21 at 2:00 PM to assess needs of client.   ?  ?Norva Riffle.Raijon Lindfors MSW, LCSW ?Licensed Clinical Social Worker ?[facility] ?[phone] ?

## 2021-04-30 LAB — URINE CULTURE

## 2021-05-09 ENCOUNTER — Ambulatory Visit (INDEPENDENT_AMBULATORY_CARE_PROVIDER_SITE_OTHER): Payer: Medicare HMO | Admitting: *Deleted

## 2021-05-09 DIAGNOSIS — E1122 Type 2 diabetes mellitus with diabetic chronic kidney disease: Secondary | ICD-10-CM

## 2021-05-09 DIAGNOSIS — I10 Essential (primary) hypertension: Secondary | ICD-10-CM

## 2021-05-09 NOTE — Patient Instructions (Signed)
Visit Information  ? ?Thank you for taking time to visit with me today. Please don't hesitate to contact me if I can be of assistance to you before our next scheduled telephone appointment. ? ?Following are the goals we discussed today:  ?Take medications as prescribed   ?Attend all scheduled provider appointments ?Call pharmacy for medication refills 3-7 days in advance of running out of medications ?Call provider office for new concerns or questions  ?Work with the Education officer, museum to address care coordination needs and will continue to work with the clinical team to address health care and disease management related needs ?check blood sugar at prescribed times: twice daily ?check feet daily for cuts, sores or redness ?enter blood sugar readings and medication or insulin into daily log ?take the blood sugar log to all doctor visits ?take the blood sugar meter to all doctor visits ?drink 6 to 8 glasses of water each day ?fill half of plate with vegetables ?prepare main meal at home 3 to 5 days each week ?read food labels for fat, fiber, carbohydrates and portion size ?check blood pressure weekly ?choose a place to take my blood pressure (home, clinic or office, retail store) ?write blood pressure results in a log or diary ?learn about high blood pressure ?keep a blood pressure log ?take blood pressure log to all doctor appointments ?keep all doctor appointments ?take medications for blood pressure exactly as prescribed ?eat more whole grains, fruits and vegetables, lean meats and healthy fats ?Look over education mailed- low sodium diet, hypoglycemia ?Talk with CAP case manager about obtaining a blood pressure cuff ?Follow low sodium diet- read labels ? ?Our next appointment is by telephone on 06/20/21 at 1045 am ? ?Please call the care guide team at [phone] if you need to cancel or reschedule your appointment.  ? ?If you are experiencing a Mental Health or [place] or need someone to talk to,  please call the Suicide and Crisis Lifeline: 988 ?call the [place] National Suicide Prevention Lifeline: 226-637-8962 or TTY: 361-503-0972 TTY 731 471 0537) to talk to a trained counselor ?call 1-800-273-TALK (toll free, 24 hour hotline) ?go to [facility] [address] [place] [phone]) ?call the [facility]: [phone] ?call 911  ? ?Following is a copy of your full care plan:  ?Care Plan : [place] of Care  ?Updates made by [name], RN since 05/09/2021 12:00 AM  ?  ? ?Problem: No plan of care established for management of chronic disease state  (HTN, DM2)   ?Priority: High  ?  ? ?Long-Range Goal: Development of plan of care for chronic disease management  (HTN, DM2)   ?Start Date: 05/09/2021  ?Expected End Date: 11/05/2021  ?Priority: High  ?Note:   ?Current Barriers:  ?Knowledge Deficits related to plan of care for management of HTN and DMII  ?Chronic Disease Management support and education needs related to HTN and DMII ?Spoke with Donelle Baba (niece, POA) and reports patient lives alone, has CAP Aide that comes in the morning and then again in the evening, pt is alone during the day and has managed well with this, has prefilled med packs, cystitis is resolved and follows up with Dr. [name], CAP aide checks CBG BID and [name] is not sure of the readings, AIC is 5.8, blood pressure is not monitored and pt does not have a blood pressure cuff, [name] reports she has contact number for CAP case worker [name] and will  request a blood pressure cuff. Patient does not follow a special diet, receives meals on wheels and aide cooks, has DME such as motorized wheel chair and walker in the home, pt is unable to walk, has Life Alert, LCSW is working with patient.  ?RNCM Clinical Goal(s):  ?Patient will verbalize understanding of plan for management of HTN and DMII as evidenced by patient, caregiver report, review of EHR and  through  collaboration with RN Care manager, provider, and care team.  ? ?Interventions: ?1:1 collaboration with primary care provider regarding development and update of comprehensive plan of care as evidenced by provider attestation and co-signature ?Inter-disciplinary care team collaboration (see longitudinal plan of care) ?Evaluation of current treatment plan related to  self management and patient's adherence to plan as established by provider ? ? ?Diabetes Interventions:  (Status:  New goal. and Goal on track:  Yes.) Long Term Goal ?Assessed patient's understanding of A1c goal: <7% ?Provided education to patient about basic DM disease process ?Reviewed medications with patient and discussed importance of medication adherence ?Discussed plans with patient for ongoing care management follow up and provided patient with direct contact information for care management team ?Advised patient, providing education and rationale, to check cbg as prescribed and record, calling primary care provider for findings outside established parameters ?Review of patient status, including review of consultants reports, relevant laboratory and other test results, and medications completed ?Screening for signs and symptoms of depression related to chronic disease state  ?Assessed social determinant of health barriers ?Reviewed carbohydrate modified diet and food choices ?Education mailed- hypoglycemia ?Lab Results  ?Component Value Date  ? HGBA1C 5.8 (H) 03/13/2021  ? ?Hypertension Interventions:  (Status:  New goal. and Goal on track:  Yes.) Long Term Goal ?Last practice recorded BP readings:  ?BP Readings from Last 3 Encounters:  ?04/04/21 (!) 130/58  ?03/21/21 102/72  ?03/16/21 (!) 155/91  ?Most recent eGFR/CrCl:  ?Lab Results  ?Component Value Date  ? EGFR 60 03/21/2021  ?  No components found for: CRCL ? ?Evaluation of current treatment plan related to hypertension self management and patient's adherence to plan as established by  provider ?Reviewed medications with patient and discussed importance of compliance ?Discussed plans with patient for ongoing care management follow up and provided patient with direct contact information for care management team ?Discussed complications of poorly controlled blood pressure such as heart disease, stroke, circulatory complications, vision complications, kidney impairment, sexual dysfunction ?Reviewed importance of adherence to low sodium diet ?Education mailed- low sodium diet ?Ask niece to talk with CAP case manager about obtaining blood pressure cuff for patient and call RN care manager back if any issues with this process  ? ?Patient Goals/Self-Care Activities: ?Take medications as prescribed   ?Attend all scheduled provider appointments ?Call pharmacy for medication refills 3-7 days in advance of running out of medications ?Call provider office for new concerns or questions  ?Work with the Education officer, museum to address care coordination needs and will continue to work with the clinical team to address health care and disease management related needs ?check blood sugar at prescribed times: twice daily ?check feet daily for cuts, sores or redness ?enter blood sugar readings and medication or insulin into daily log ?take the blood sugar log to all doctor visits ?take the blood sugar meter to all doctor visits ?drink 6 to 8 glasses of water each day ?fill half of plate with vegetables ?prepare main meal at home 3 to 5 days each week ?read food labels for  fat, fiber, carbohydrates and portion size ?check blood pressure weekly ?choose a place to take my blood pressure (home, clinic or office, retail store) ?write blood pressure results in a log or diary ?learn about high blood pressure ?keep a blood pressure log ?take blood pressure log to all doctor appointments ?keep all doctor appointments ?take medications for blood pressure exactly as prescribed ?eat more whole grains, fruits and vegetables, lean meats  and healthy fats ?Look over education mailed- low sodium diet, hypoglycemia ?Talk with CAP case manager about obtaining a blood pressure cuff ?Follow low sodium diet- read labels ?  ?  ? ? ?Consent to CCM Services: ?Mr.

## 2021-05-09 NOTE — Chronic Care Management (AMB) (Signed)
?Chronic Care Management  ? ?CCM RN Visit Note ? ?05/09/2021 ?Name: Mike Briggs MRN: [id number] DOB: 1928/02/04 ? ?Subjective: ?Mike Briggs is a 88 y.o. year old male who is a primary care patient of [name], MD. The care management team was consulted for assistance with disease management and care coordination needs.   ? ?Engaged with patient by telephone for initial visit in response to provider referral for case management and/or care coordination services.  ? ?Consent to Services:  ?The patient was given the following information about Chronic Care Management services today, agreed to services, and gave verbal consent: 1. CCM service includes personalized support from designated clinical staff supervised by the primary care provider, including individualized plan of care and coordination with other care providers 2. 24/7 contact phone numbers for assistance for urgent and routine care needs. 3. Service will only be billed when office clinical staff spend 20 minutes or more in a month to coordinate care. 4. Only one practitioner may furnish and bill the service in a calendar month. 5.The patient may stop CCM services at any time (effective at the end of the month) by phone call to the office staff. 6. The patient will be responsible for cost sharing (co-pay) of up to 20% of the service fee (after annual deductible is met). Patient agreed to services and consent obtained. ? ?Patient agreed to services and verbal consent obtained.  ? ?Assessment: Review of patient past medical history, allergies, medications, health status, including review of consultants reports, laboratory and other test data, was performed as part of comprehensive evaluation and provision of chronic care management services.  ? ?SDOH (Social Determinants of Health) assessments and interventions performed:  ?SDOH Interventions   ? ?Flowsheet Row Most Recent Value  ?SDOH Interventions   ?Food Insecurity Interventions Intervention  Not Indicated  ?Housing Interventions Intervention Not Indicated  ?Transportation Interventions Intervention Not Indicated  ? ?  ?  ? ?[place] ? ?No Known Allergies ? ?Outpatient Encounter Medications as of 05/09/2021  ?Medication Sig Note  ? amLODipine (NORVASC) 10 MG tablet TAKE ONE TABLET BY MOUTH ONCE DAILY 04/03/2021: 03/28/2021 10 MG TABS (disp 30, 30d supply)   ? aspirin 81 MG chewable tablet Chew 1 tablet (81 mg total) by mouth daily with breakfast.   ? atorvastatin (LIPITOR) 10 MG tablet TAKE 1 TABLET BY MOUTH AFTER SUPPER. 04/03/2021: 03/28/2021 10 MG TABS (disp 30, 30d supply)   ? blood glucose meter kit and supplies Dispense based on patient and insurance preference. Use up to four times daily as directed. (FOR ICD-10 E10.9, E11.9).   ? diclofenac Sodium (VOLTAREN) 1 % GEL Apply 4 g topically 3 (three) times daily as needed (arthritic wrist pain).   ? LANTUS SOLOSTAR 100 UNIT/ML Solostar Pen Inject 20 Units into the skin daily. 03/13/2021: LF:01/18/2021 DS:75   ? lisinopril (ZESTRIL) 40 MG tablet Take 1 tablet (40 mg total) by mouth daily.   ? pantoprazole (PROTONIX) 40 MG tablet TAKE ONE TABLET BY MOUTH ONCE DAILY.   ? SURE COMFORT PEN NEEDLES 31G X 5 MM MISC USE TO INJECT AS DIRECTED FOUR TIMES A DAY. WITH MEALS AND BEFORE BEDTIME.   ? tamsulosin (FLOMAX) 0.4 MG CAPS capsule TAKE 1 CAPSULE BY MOUTH DAILY AFTER BREAKFAST.   ? trimethoprim (TRIMPEX) 100 MG tablet Take 1 tablet (100 mg total) by mouth daily.   ? ?No facility-administered encounter medications on file as of 05/09/2021.  ? ? ?Patient Active Problem List  ? Diagnosis  Date Noted  ? Chronic cystitis 04/25/2021  ? Hospital discharge follow-up 03/21/2021  ? Physical deconditioning 03/14/2021  ? UTI (urinary tract infection) 03/13/2021  ? Dementia without behavioral disturbance ([place]) 03/13/2021  ? Chronic kidney disease, stage 3b ([place]) 03/13/2021  ? Encounter for examination following treatment at hospital 03/08/2021  ? Dysphagia   ?  Gastroesophageal reflux disease   ? Chest pain 01/26/2021  ? Foot ulcer ([place]) 11/24/2020  ? PVD (peripheral vascular disease) ([place]) 11/24/2020  ? Polyarthritis 12/10/2019  ? Hydrocele 05/27/2019  ? Mobitz type 1 second degree atrioventricular block   ? Sacral decubitus ulcer, stage III ([place]) 11/11/2018  ? Pressure injury of skin 11/08/2018  ? Essential hypertension 05/22/2016  ? Hemiparesis affecting right side as late effect of cerebrovascular accident (CVA) ([place]) 05/22/2016  ? BPH (benign prostatic hyperplasia) 05/22/2016  ? Chronic constipation 05/22/2016  ? Hyperlipidemia 05/22/2016  ? Pseudophakia 12/06/2015  ? Vascular dementia ([place]) 10/23/2012  ? Type 2 diabetes mellitus with diabetic chronic kidney disease ([place]) 12/16/2008  ? ? ?Conditions to be addressed/monitored:HTN and DMII ? ?Care Plan : RN Care Manager Plan of Care  ?Updates made by [name], RN since 05/09/2021 12:00 AM  ?  ? ?Problem: No plan of care established for management of chronic disease state  (HTN, DM2)   ?Priority: High  ?  ? ?Long-Range Goal: Development of plan of care for chronic disease management  (HTN, DM2)   ?Start Date: 05/09/2021  ?Expected End Date: 11/05/2021  ?Priority: High  ?Note:   ?Current Barriers:  ?Knowledge Deficits related to plan of care for management of HTN and DMII  ?Chronic Disease Management support and education needs related to HTN and DMII ?Spoke with Kou Gucciardo (niece, POA) and reports patient lives alone, has CAP Aide that comes in the morning and then again in the evening, pt is alone during the day and has managed well with this, has prefilled med packs, cystitis is resolved and follows up with Dr. [name], CAP aide checks CBG BID and [name] is not sure of the readings, AIC is 5.8, blood pressure is not monitored and pt does not have a blood pressure cuff, [name] reports she has contact number for CAP case worker [name] and will request a blood pressure cuff. Patient does not follow a special diet,  receives meals on wheels and aide cooks, has DME such as motorized wheel chair and walker in the home, pt is unable to walk, has Life Alert, LCSW is working with patient.  ?RNCM Clinical Goal(s):  ?Patient will verbalize understanding of plan for management of HTN and DMII as evidenced by patient, caregiver report, review of EHR and  through collaboration with RN Care manager, provider, and care team.  ? ?Interventions: ?1:1 collaboration with primary care provider regarding development and update of comprehensive plan of care as evidenced by provider attestation and co-signature ?Inter-disciplinary care team collaboration (see longitudinal plan of care) ?Evaluation of current treatment plan related to  self management and patient's adherence to plan as established by provider ? ? ?Diabetes Interventions:  (Status:  New goal. and Goal on track:  Yes.) Long Term Goal ?Assessed patient's understanding of A1c goal: <7% ?Provided education to patient about basic DM disease process ?Reviewed medications with patient and discussed importance of medication adherence ?Discussed plans with patient for ongoing care management follow up and provided patient with direct contact information for care management team ?Advised patient, providing education and rationale, to check cbg as prescribed and  record, calling primary care provider for findings outside established parameters ?Review of patient status, including review of consultants reports, relevant laboratory and other test results, and medications completed ?Screening for signs and symptoms of depression related to chronic disease state  ?Assessed social determinant of health barriers ?Reviewed carbohydrate modified diet and food choices ?Education mailed- hypoglycemia ?Lab Results  ?Component Value Date  ? HGBA1C 5.8 (H) 03/13/2021  ? ?Hypertension Interventions:  (Status:  New goal. and Goal on track:  Yes.) Long Term Goal ?Last practice recorded BP readings:  ?BP  Readings from Last 3 Encounters:  ?04/04/21 (!) 130/58  ?03/21/21 102/72  ?03/16/21 (!) 155/91  ?Most recent eGFR/CrCl:  ?Lab Results  ?Component Value Date  ? EGFR 60 03/21/2021  ?  No components found for: CRCL ? ?Eval

## 2021-05-23 ENCOUNTER — Ambulatory Visit: Payer: Medicare HMO

## 2021-05-26 ENCOUNTER — Other Ambulatory Visit: Payer: Self-pay

## 2021-05-26 ENCOUNTER — Other Ambulatory Visit: Payer: Self-pay | Admitting: Urology

## 2021-05-26 DIAGNOSIS — R339 Retention of urine, unspecified: Secondary | ICD-10-CM

## 2021-05-26 MED ORDER — TAMSULOSIN HCL 0.4 MG PO CAPS: ORAL_CAPSULE | ORAL | 11 refills | Status: AC

## 2021-05-28 DIAGNOSIS — E1169 Type 2 diabetes mellitus with other specified complication: Secondary | ICD-10-CM

## 2021-05-28 DIAGNOSIS — I10 Essential (primary) hypertension: Secondary | ICD-10-CM | POA: Diagnosis not present

## 2021-06-05 ENCOUNTER — Ambulatory Visit: Payer: Medicare HMO | Admitting: Internal Medicine

## 2021-06-08 ENCOUNTER — Ambulatory Visit (INDEPENDENT_AMBULATORY_CARE_PROVIDER_SITE_OTHER): Payer: Medicare HMO | Admitting: Internal Medicine

## 2021-06-08 ENCOUNTER — Emergency Department (HOSPITAL_COMMUNITY): Payer: Medicare HMO

## 2021-06-08 ENCOUNTER — Other Ambulatory Visit: Payer: Self-pay

## 2021-06-08 ENCOUNTER — Encounter: Payer: Self-pay | Admitting: Internal Medicine

## 2021-06-08 ENCOUNTER — Inpatient Hospital Stay (HOSPITAL_COMMUNITY)
Admission: EM | Admit: 2021-06-08 | Discharge: 2021-06-13 | DRG: 312 | Disposition: A | Discharge status: Home Health | Payer: Medicare HMO | Source: Ambulatory Visit | Attending: Internal Medicine | Admitting: Internal Medicine

## 2021-06-08 VITALS — BP 118/62 | HR 105 | Resp 18 | Ht 71.0 in

## 2021-06-08 DIAGNOSIS — E119 Type 2 diabetes mellitus without complications: Secondary | ICD-10-CM

## 2021-06-08 DIAGNOSIS — I129 Hypertensive chronic kidney disease with stage 1 through stage 4 chronic kidney disease, or unspecified chronic kidney disease: Secondary | ICD-10-CM | POA: Diagnosis present

## 2021-06-08 DIAGNOSIS — Z6831 Body mass index (BMI) 31.0-31.9, adult: Secondary | ICD-10-CM | POA: Diagnosis not present

## 2021-06-08 DIAGNOSIS — I959 Hypotension, unspecified: Secondary | ICD-10-CM | POA: Diagnosis not present

## 2021-06-08 DIAGNOSIS — E876 Hypokalemia: Secondary | ICD-10-CM | POA: Diagnosis present

## 2021-06-08 DIAGNOSIS — I4891 Unspecified atrial fibrillation: Secondary | ICD-10-CM | POA: Diagnosis not present

## 2021-06-08 DIAGNOSIS — I441 Atrioventricular block, second degree: Secondary | ICD-10-CM | POA: Diagnosis present

## 2021-06-08 DIAGNOSIS — Z794 Long term (current) use of insulin: Secondary | ICD-10-CM | POA: Diagnosis not present

## 2021-06-08 DIAGNOSIS — E669 Obesity, unspecified: Secondary | ICD-10-CM | POA: Diagnosis present

## 2021-06-08 DIAGNOSIS — Z7982 Long term (current) use of aspirin: Secondary | ICD-10-CM

## 2021-06-08 DIAGNOSIS — Z7189 Other specified counseling: Secondary | ICD-10-CM | POA: Diagnosis not present

## 2021-06-08 DIAGNOSIS — M79643 Pain in unspecified hand: Secondary | ICD-10-CM | POA: Diagnosis not present

## 2021-06-08 DIAGNOSIS — R9431 Abnormal electrocardiogram [ECG] [EKG]: Secondary | ICD-10-CM | POA: Diagnosis not present

## 2021-06-08 DIAGNOSIS — N189 Chronic kidney disease, unspecified: Secondary | ICD-10-CM | POA: Diagnosis present

## 2021-06-08 DIAGNOSIS — F0390 Unspecified dementia without behavioral disturbance: Secondary | ICD-10-CM

## 2021-06-08 DIAGNOSIS — Z8616 Personal history of COVID-19: Secondary | ICD-10-CM | POA: Diagnosis not present

## 2021-06-08 DIAGNOSIS — Z743 Need for continuous supervision: Secondary | ICD-10-CM | POA: Diagnosis not present

## 2021-06-08 DIAGNOSIS — E1122 Type 2 diabetes mellitus with diabetic chronic kidney disease: Secondary | ICD-10-CM | POA: Diagnosis present

## 2021-06-08 DIAGNOSIS — I493 Ventricular premature depolarization: Secondary | ICD-10-CM | POA: Diagnosis not present

## 2021-06-08 DIAGNOSIS — I499 Cardiac arrhythmia, unspecified: Secondary | ICD-10-CM | POA: Diagnosis present

## 2021-06-08 DIAGNOSIS — E785 Hyperlipidemia, unspecified: Secondary | ICD-10-CM | POA: Diagnosis present

## 2021-06-08 DIAGNOSIS — N182 Chronic kidney disease, stage 2 (mild): Secondary | ICD-10-CM | POA: Diagnosis not present

## 2021-06-08 DIAGNOSIS — Z515 Encounter for palliative care: Secondary | ICD-10-CM | POA: Diagnosis not present

## 2021-06-08 DIAGNOSIS — I472 Ventricular tachycardia, unspecified: Secondary | ICD-10-CM | POA: Diagnosis not present

## 2021-06-08 DIAGNOSIS — N183 Chronic kidney disease, stage 3 unspecified: Secondary | ICD-10-CM | POA: Diagnosis not present

## 2021-06-08 DIAGNOSIS — J3489 Other specified disorders of nose and nasal sinuses: Secondary | ICD-10-CM | POA: Diagnosis not present

## 2021-06-08 DIAGNOSIS — I69351 Hemiplegia and hemiparesis following cerebral infarction affecting right dominant side: Secondary | ICD-10-CM | POA: Diagnosis not present

## 2021-06-08 DIAGNOSIS — R55 Syncope and collapse: Secondary | ICD-10-CM | POA: Diagnosis present

## 2021-06-08 DIAGNOSIS — R42 Dizziness and giddiness: Secondary | ICD-10-CM | POA: Diagnosis not present

## 2021-06-08 DIAGNOSIS — F32A Depression, unspecified: Secondary | ICD-10-CM | POA: Diagnosis present

## 2021-06-08 DIAGNOSIS — D649 Anemia, unspecified: Secondary | ICD-10-CM | POA: Diagnosis present

## 2021-06-08 DIAGNOSIS — N1832 Chronic kidney disease, stage 3b: Secondary | ICD-10-CM | POA: Diagnosis present

## 2021-06-08 DIAGNOSIS — Z993 Dependence on wheelchair: Secondary | ICD-10-CM | POA: Diagnosis not present

## 2021-06-08 DIAGNOSIS — I440 Atrioventricular block, first degree: Secondary | ICD-10-CM | POA: Diagnosis present

## 2021-06-08 DIAGNOSIS — R52 Pain, unspecified: Secondary | ICD-10-CM | POA: Diagnosis not present

## 2021-06-08 DIAGNOSIS — M19049 Primary osteoarthritis, unspecified hand: Secondary | ICD-10-CM | POA: Insufficient documentation

## 2021-06-08 DIAGNOSIS — F0150 Vascular dementia without behavioral disturbance: Secondary | ICD-10-CM | POA: Diagnosis present

## 2021-06-08 DIAGNOSIS — E161 Other hypoglycemia: Secondary | ICD-10-CM | POA: Diagnosis not present

## 2021-06-08 DIAGNOSIS — Z79899 Other long term (current) drug therapy: Secondary | ICD-10-CM

## 2021-06-08 DIAGNOSIS — R4020 Unspecified coma: Secondary | ICD-10-CM | POA: Diagnosis not present

## 2021-06-08 DIAGNOSIS — I10 Essential (primary) hypertension: Secondary | ICD-10-CM | POA: Diagnosis not present

## 2021-06-08 DIAGNOSIS — R001 Bradycardia, unspecified: Secondary | ICD-10-CM | POA: Diagnosis not present

## 2021-06-08 DIAGNOSIS — N1831 Chronic kidney disease, stage 3a: Secondary | ICD-10-CM | POA: Diagnosis not present

## 2021-06-08 DIAGNOSIS — E162 Hypoglycemia, unspecified: Secondary | ICD-10-CM | POA: Diagnosis not present

## 2021-06-08 DIAGNOSIS — R531 Weakness: Secondary | ICD-10-CM | POA: Diagnosis not present

## 2021-06-08 DIAGNOSIS — Z87891 Personal history of nicotine dependence: Secondary | ICD-10-CM | POA: Diagnosis not present

## 2021-06-08 LAB — COMPREHENSIVE METABOLIC PANEL
ALT: 6 U/L (ref 0–44)
AST: 12 U/L — ABNORMAL LOW (ref 15–41)
Albumin: 2.6 g/dL — ABNORMAL LOW (ref 3.5–5.0)
Alkaline Phosphatase: 117 U/L (ref 38–126)
Anion gap: 8 (ref 5–15)
BUN: 17 mg/dL (ref 8–23)
CO2: 23 mmol/L (ref 22–32)
Calcium: 7.9 mg/dL — ABNORMAL LOW (ref 8.9–10.3)
Chloride: 109 mmol/L (ref 98–111)
Creatinine, Ser: 1.34 mg/dL — ABNORMAL HIGH (ref 0.61–1.24)
GFR, Estimated: 50 mL/min — ABNORMAL LOW (ref >60)
Glucose, Bld: 126 mg/dL — ABNORMAL HIGH (ref 70–99)
Potassium: 3.7 mmol/L (ref 3.5–5.1)
Sodium: 140 mmol/L (ref 135–145)
Total Bilirubin: 0.6 mg/dL (ref 0.3–1.2)
Total Protein: 6.6 g/dL (ref 6.5–8.1)

## 2021-06-08 LAB — TROPONIN I (HIGH SENSITIVITY)
Troponin I (High Sensitivity): 34 ng/L — ABNORMAL HIGH (ref <18)
Troponin I (High Sensitivity): 31 ng/L — ABNORMAL HIGH (ref <18)

## 2021-06-08 LAB — CBC WITH DIFFERENTIAL/PLATELET
Abs Immature Granulocytes: 0.05 10*3/uL (ref 0.00–0.07)
Basophils Absolute: 0.1 10*3/uL (ref 0.0–0.1)
Basophils Relative: 0 %
Eosinophils Absolute: 0.1 10*3/uL (ref 0.0–0.5)
Eosinophils Relative: 1 %
HCT: 34.6 % — ABNORMAL LOW (ref 39.0–52.0)
Hemoglobin: 10.6 g/dL — ABNORMAL LOW (ref 13.0–17.0)
Immature Granulocytes: 0 %
Lymphocytes Relative: 17 %
Lymphs Abs: 2.2 10*3/uL (ref 0.7–4.0)
MCH: 27.5 pg (ref 26.0–34.0)
MCHC: 30.6 g/dL (ref 30.0–36.0)
MCV: 89.6 fL (ref 80.0–100.0)
Monocytes Absolute: 1.1 10*3/uL — ABNORMAL HIGH (ref 0.1–1.0)
Monocytes Relative: 8 %
Neutro Abs: 9.6 10*3/uL — ABNORMAL HIGH (ref 1.7–7.7)
Neutrophils Relative %: 74 %
Platelets: 224 10*3/uL (ref 150–400)
RBC: 3.86 MIL/uL — ABNORMAL LOW (ref 4.22–5.81)
RDW: 18 % — ABNORMAL HIGH (ref 11.5–15.5)
WBC: 13.1 10*3/uL — ABNORMAL HIGH (ref 4.0–10.5)
nRBC: 0 % (ref 0.0–0.2)

## 2021-06-08 MED ORDER — SODIUM CHLORIDE 0.9 % IV BOLUS
250.0000 mL | Freq: Once | INTRAVENOUS | Status: AC
  Administered 2021-06-08: 250 mL via INTRAVENOUS

## 2021-06-08 NOTE — Assessment & Plan Note (Signed)
On [place] ?Dependent for ADLs, has home health aide ?

## 2021-06-08 NOTE — Assessment & Plan Note (Signed)
Stable renal function with serum cr at 3A. ? ?Plan to add IV fluids and follow up renal function in am. ?Avoid hypotension and nephrotoxic medications.  ?

## 2021-06-08 NOTE — ED Notes (Signed)
Pt is alert and oriented X 4 at this time. Pt seems to have a hard time hearing, but knows what is going on. Does not remember losing consciousness, states he "just fell asleep". EMS states that doctor was able to arouse him with a sternal rub and pt came back to immediately afterwards. ?

## 2021-06-08 NOTE — Assessment & Plan Note (Addendum)
Has chronic hand pain with swelling, R > L ?Swelling also likely due to dependent position from residual hemiplegia ?Will evaluate in the next visit when stable ?

## 2021-06-08 NOTE — ED Triage Notes (Signed)
Pt brought by EMS from [facility]. EMS states pt had a syncopal episode and came back to shortly after. MD told EMS this is not like the pt. Pt alert but not oriented at this time. Pt seems hard of hearing and is not answering questions appropriately at this time.  ?

## 2021-06-08 NOTE — ED Provider Notes (Signed)
?[facility] ?Provider Note ? ? ?CSN: [id number] ?Arrival date & time: 06/08/21  1505 ? ?  ? ?History ? ?Chief Complaint  ?Patient presents with  ? Loss of Consciousness  ? ? ?Mike Briggs is a 88 y.o. male. ? ?Patient with a history of hypertension and diabetes.  He had a syncopal episode at doctor's office ? ?The history is provided by the patient and medical records. No language interpreter was used.  ?Loss of Consciousness ?Episode history:  Single ?Most recent episode:  Today ?Timing:  Intermittent ?Progression:  Improving ?Chronicity:  New ?Context: not blood draw   ?Witnessed: no   ?Relieved by:  Nothing ?Worsened by:  Nothing ?Ineffective treatments:  None tried ?Associated symptoms: dizziness   ?Associated symptoms: no chest pain, no headaches and no seizures   ? ?  ? ?Home Medications ?Prior to Admission medications   ?Medication Sig Start Date End Date Taking? Authorizing Provider  ?amLODipine (NORVASC) 10 MG tablet TAKE ONE TABLET BY MOUTH ONCE DAILY 03/28/21   [name], MD  ?aspirin 81 MG chewable tablet Chew 1 tablet (81 mg total) by mouth daily with breakfast. 12/04/19   Perlie Mayo, NP  ?atorvastatin (LIPITOR) 10 MG tablet TAKE 1 TABLET BY MOUTH AFTER SUPPER. 03/28/21   [name], MD  ?blood glucose meter kit and supplies Dispense based on patient and insurance preference. Use up to four times daily as directed. (FOR ICD-10 E10.9, E11.9). 04/05/20   [name], MD  ?diclofenac Sodium (VOLTAREN) 1 % GEL Apply 4 g topically 3 (three) times daily as needed (arthritic wrist pain). 04/04/21   Barton Dubois, MD  ?LANTUS SOLOSTAR 100 UNIT/ML Solostar Pen Inject 20 Units into the skin daily. 10/10/20   [name], MD  ?lisinopril (ZESTRIL) 40 MG tablet Take 1 tablet (40 mg total) by mouth daily. 04/19/21   [name], MD  ?pantoprazole (PROTONIX) 40 MG tablet TAKE ONE TABLET BY MOUTH ONCE DAILY. 04/26/21   [name], MD  ?SURE COMFORT PEN NEEDLES 31G X 5 MM  MISC USE TO INJECT AS DIRECTED FOUR TIMES A DAY. WITH MEALS AND BEFORE BEDTIME. 08/17/20   [name], MD  ?tamsulosin (FLOMAX) 0.4 MG CAPS capsule TAKE 1 CAPSULE BY MOUTH DAILY AFTER BREAKFAST. 05/26/21   Irine Seal, MD  ?trimethoprim (TRIMPEX) 100 MG tablet Take 1 tablet (100 mg total) by mouth daily. 04/08/21   Barton Dubois, MD  ?   ? ?Allergies    ?Patient has no known allergies.   ? ?Review of Systems   ?Review of Systems  ?Constitutional:  Negative for appetite change and fatigue.  ?HENT:  Negative for congestion, ear discharge and sinus pressure.   ?Eyes:  Negative for discharge.  ?Respiratory:  Negative for cough.   ?Cardiovascular:  Positive for syncope. Negative for chest pain.  ?Gastrointestinal:  Negative for abdominal pain and diarrhea.  ?Genitourinary:  Negative for frequency and hematuria.  ?Musculoskeletal:  Negative for back pain.  ?Skin:  Negative for rash.  ?Neurological:  Positive for dizziness. Negative for seizures and headaches.  ?Psychiatric/Behavioral:  Negative for hallucinations.   ? ?Physical Exam ?Updated Vital Signs ?BP (!) 149/74 (BP Location: Left Arm)   Pulse 71   Resp 17   Ht 5' 11" (1.803 m)   Wt 102.3 kg   SpO2 97%   BMI 31.46 kg/m?  ?Physical Exam ?Vitals and nursing note reviewed.  ?Constitutional:   ?   Appearance: He is  well-developed.  ?HENT:  ?   Head: Normocephalic.  ?   Nose: Nose normal.  ?Eyes:  ?   General: No scleral icterus. ?   Conjunctiva/sclera: Conjunctivae normal.  ?Neck:  ?   Thyroid: No thyromegaly.  ?Cardiovascular:  ?   Rate and Rhythm: Normal rate and regular rhythm.  ?   Heart sounds: No murmur heard. ?  No friction rub. No gallop.  ?Pulmonary:  ?   Breath sounds: No stridor. No wheezing or rales.  ?Chest:  ?   Chest wall: No tenderness.  ?Abdominal:  ?   General: There is no distension.  ?   Tenderness: There is no abdominal tenderness. There is no rebound.  ?Musculoskeletal:     ?   General: Normal range of motion.  ?   Cervical back: Neck  supple.  ?Lymphadenopathy:  ?   Cervical: No cervical adenopathy.  ?Skin: ?   Findings: No erythema or rash.  ?Neurological:  ?   Mental Status: He is alert and oriented to person, place, and time.  ?   Motor: No abnormal muscle tone.  ?   Coordination: Coordination normal.  ?Psychiatric:     ?   Behavior: Behavior normal.  ? ? ?ED Results / Procedures / Treatments   ?Labs ?(all labs ordered are listed, but only abnormal results are displayed) ?Labs Reviewed  ?CBC WITH DIFFERENTIAL/PLATELET - Abnormal; Notable for the following components:  ?    Result Value  ? WBC 13.1 (*)   ? RBC 3.86 (*)   ? Hemoglobin 10.6 (*)   ? HCT 34.6 (*)   ? RDW 18.0 (*)   ? Neutro Abs 9.6 (*)   ? Monocytes Absolute 1.1 (*)   ? All other components within normal limits  ?COMPREHENSIVE METABOLIC PANEL - Abnormal; Notable for the following components:  ? Glucose, Bld 126 (*)   ? Creatinine, Ser 1.34 (*)   ? Calcium 7.9 (*)   ? Albumin 2.6 (*)   ? AST 12 (*)   ? GFR, Estimated 50 (*)   ? All other components within normal limits  ?TROPONIN I (HIGH SENSITIVITY) - Abnormal; Notable for the following components:  ? Troponin I (High Sensitivity) 34 (*)   ? All other components within normal limits  ?TROPONIN I (HIGH SENSITIVITY) - Abnormal; Notable for the following components:  ? Troponin I (High Sensitivity) 31 (*)   ? All other components within normal limits  ?URINALYSIS, ROUTINE W REFLEX MICROSCOPIC  ? ? ?EKG ?EKG Interpretation ? ?Date/Time:  Thursday Jun 08 2021 15:21:39 EDT ?Ventricular Rate:  84 ?PR Interval:  217 ?QRS Duration: 94 ?QT Interval:  374 ?QTC Calculation: 443 ?R Axis:   -9 ?Text Interpretation: Sinus rhythm Multiform ventricular premature complexes Borderline prolonged PR interval Low voltage, precordial leads Confirmed by Milton Ferguson [phone]) on 06/08/2021 7:25:05 PM ? ?Radiology ?CT Head Wo Contrast ? ?Result Date: 06/08/2021 ?CLINICAL DATA:  Dizziness, persistent/recurrent, cardiac or vascular cause suspected EXAM: CT  HEAD WITHOUT CONTRAST TECHNIQUE: Contiguous axial images were obtained from the base of the skull through the vertex without intravenous contrast. RADIATION DOSE REDUCTION: This exam was performed according to the departmental dose-optimization program which includes automated exposure control, adjustment of the mA and/or kV according to patient size and/or use of iterative reconstruction technique. COMPARISON:  CT head April 01, 2021. FINDINGS: Brain: No evidence of acute infarction, hemorrhage, hydrocephalus, extra-axial collection or mass lesion/mass effect. Vascular: No hyperdense vessel identified. Calcific intracranial atherosclerosis. Skull: No acute  fracture. Sinuses/Orbits: Mild paranasal sinus mucosal thickening. No acute orbital findings. Other: No mastoid effusions. IMPRESSION: No evidence of acute intracranial abnormality. Electronically Signed   By: Margaretha Sheffield M.D.   On: 06/08/2021 17:15   ? ?Procedures ?Procedures  ? ? ?Medications Ordered in ED ?Medications  ?sodium chloride 0.9 % bolus 250 mL (0 mLs Intravenous Stopped 06/08/21 1629)  ? ? ?ED Course/ Medical Decision Making/ A&P ?  ?                        ?Medical Decision Making ?Amount and/or Complexity of Data Reviewed ?Labs: ordered. ?Radiology: ordered. ? ?Risk ?Decision regarding hospitalization. ? ?This patient presents to the ED for concern of syncope, this involves an extensive number of treatment options, and is a complaint that carries with it a high risk of complications and morbidity.  The differential diagnosis includes stroke, dehydration, hypoglycemia ? ? ?Co morbidities that complicate the patient evaluation ? ?Hypertension diabetes ? ? ?Additional history obtained: ? ?Additional history obtained from Dr. Elliot Gurney ?External records from outside source obtained and reviewed including hospital record ? ? ?Lab Tests: ? ?I Ordered, and personally interpreted labs.  The pertinent results include: CBC shows white count 13,000 and  glucose is also 127 ? ? ?Imaging Studies ordered: ? ?I ordered imaging studies including CT head and chest x-ray ?I independently visualized and interpreted imaging which showed no acute disease ?I agree with the radio

## 2021-06-08 NOTE — Progress Notes (Signed)
? ?Established Patient Office Visit ? ?Subjective:  ?Patient ID: Mike Briggs, male    DOB: 1928-03-07  Age: 88 y.o. MRN: [id number] ? ?CC:  ?Chief Complaint  ?Patient presents with  ? Follow-up  ?  3 month follow up pt has right hand swelling goes and comes has been doing since last er visit in feb   ? ? ?HPI ?Mike Briggs is a 88 y.o. male with past medical history of HTN, CVA with residual right sided hemiplegia, DM with CKD stage 3, Mobitz type 1 second degree AV block, dementia and BPH who presents for f/u of his chronic medical conditions. ? ?Possible CVA: He was responsive and talking to the nursing aide and CMA while rooming in.  When I went to examine the patient, he was not responsive to verbal stimuli.  He was only responding to painful stimuli with eye movement.  His BP was WNL.  His blood glucose was 141.  He has residual right-sided hemiplegia from old CVA, but was not able to move his left UE as well.  After few minutes, he started nodding his head when asked to do so.  EMS was called for ER evaluation for acute CVA. ? ?Nursing aide states that he was in his usual state of health till now, and denies any active complaints except right hand pain. ? ? ? ?Past Medical History:  ?Diagnosis Date  ? Acute cystitis 12/17/2018  ? Acute encephalopathy 11/14/2018  ? Acute metabolic encephalopathy 0/25/4270  ? ANKLE, ARTHRITIS, DEGEN./OSTEO 12/16/2008  ? Qualifier: Diagnosis of  By: Aline Brochure MD, Dorothyann Peng    ? Arthritis   ? Cataract   ? Chronic kidney disease   ? COVID-19   ? COVID-19 virus detected 11/03/2018  ? COVID-19 virus infection 11/03/2018  ? Dehydration 11/02/2018  ? Depression   ? Diabetes mellitus   ? Gram-positive bacteremia 11/03/2018  ? Hyperlipidemia   ? Hypertension   ? Hypomagnesemia 03/24/2019  ? Klebsiella UTI  03/25/2019  ? Pain in finger of right hand 05/27/2019  ? Personal history of gout 05/22/2016  ? Rhabdomyolysis 11/02/2018  ? Sepsis secondary to UTI ([place]) 03/13/2021  ? Stroke [facility])   ?  Swelling of both hands 05/27/2019  ? Syncope 02/25/2018  ? TIA (transient ischemic attack) 11/14/2018  ? UTI (urinary tract infection) 02/26/2018  ? Vitamin D deficiency 05/31/2016  ? ? ?Past Surgical History:  ?Procedure Laterality Date  ? BACK SURGERY    ? cervical and lumbar  ? CATARACT EXTRACTION    ? CHOLECYSTECTOMY    ? FOOT SURGERY    ? 5th toe   ? SPINE SURGERY    ? lumbar and cervical fusions  ? ? ?Family History  ?Family history unknown: Yes  ? ? ?Social History  ? ?Socioeconomic History  ? Marital status: Divorced  ?  Spouse name: Not on file  ? Number of children: 3  ? Years of education: 10  ? Highest education level: Not on file  ?Occupational History  ? Occupation: Retired.  ?Tobacco Use  ? Smoking status: Former  ?  Types: Cigarettes  ?  Quit date: 08/07/1967  ?  Years since quitting: 53.8  ? Smokeless tobacco: Never  ?Vaping Use  ? Vaping Use: Never used  ?Substance and Sexual Activity  ? Alcohol use: No  ? Drug use: No  ? Sexual activity: Not Currently  ?Other Topics Concern  ? Not on file  ?Social History Narrative  ? Wheelchair bound.Has not walked  in many years, at least five years.  Lives alone. Has 2 aides that come in to help him.  ? Lives alone.   ? Retired.  ? Eats all food groups.   ? Has 3 children but does not have contact with them.  ? Divorced.  ? ?Social Determinants of Health  ? ?Financial Resource Strain: Low Risk   ? Difficulty of Paying Living Expenses: Not hard at all  ?Food Insecurity: No Food Insecurity  ? Worried About Charity fundraiser in the Last Year: Never true  ? Ran Out of Food in the Last Year: Never true  ?Transportation Needs: No Transportation Needs  ? Lack of Transportation (Medical): No  ? Lack of Transportation (Non-Medical): No  ?Physical Activity: Inactive  ? Days of Exercise per Week: 0 days  ? Minutes of Exercise per Session: 0 min  ?Stress: Stress Concern Present  ? Feeling of Stress : To some extent  ?Social Connections: Socially Isolated  ? Frequency of  Communication with Friends and Family: Never  ? Frequency of Social Gatherings with Friends and Family: Once a week  ? Attends Religious Services: Never  ? Active Member of Clubs or Organizations: No  ? Attends Archivist Meetings: Never  ? Marital Status: Divorced  ?Intimate Partner Violence: Not At Risk  ? Fear of Current or Ex-Partner: No  ? Emotionally Abused: No  ? Physically Abused: No  ? Sexually Abused: No  ? ? ?Outpatient Medications Prior to Visit  ?Medication Sig Dispense Refill  ? amLODipine (NORVASC) 10 MG tablet TAKE ONE TABLET BY MOUTH ONCE DAILY 90 tablet 3  ? aspirin 81 MG chewable tablet Chew 1 tablet (81 mg total) by mouth daily with breakfast. 30 tablet 3  ? atorvastatin (LIPITOR) 10 MG tablet TAKE 1 TABLET BY MOUTH AFTER SUPPER. 90 tablet 1  ? blood glucose meter kit and supplies Dispense based on patient and insurance preference. Use up to four times daily as directed. (FOR ICD-10 E10.9, E11.9). 1 each 0  ? diclofenac Sodium (VOLTAREN) 1 % GEL Apply 4 g topically 3 (three) times daily as needed (arthritic wrist pain). 100 g 0  ? LANTUS SOLOSTAR 100 UNIT/ML Solostar Pen Inject 20 Units into the skin daily. 15 mL 3  ? lisinopril (ZESTRIL) 40 MG tablet Take 1 tablet (40 mg total) by mouth daily. 30 tablet 5  ? pantoprazole (PROTONIX) 40 MG tablet TAKE ONE TABLET BY MOUTH ONCE DAILY. 90 tablet 3  ? SURE COMFORT PEN NEEDLES 31G X 5 MM MISC USE TO INJECT AS DIRECTED FOUR TIMES A DAY. WITH MEALS AND BEFORE BEDTIME. 100 each 0  ? tamsulosin (FLOMAX) 0.4 MG CAPS capsule TAKE 1 CAPSULE BY MOUTH DAILY AFTER BREAKFAST. 30 capsule 11  ? trimethoprim (TRIMPEX) 100 MG tablet Take 1 tablet (100 mg total) by mouth daily. 30 tablet 1  ? ?No facility-administered medications prior to visit.  ? ? ?No Known Allergies ? ?ROS ?Review of Systems  ?Unable to perform ROS: Patient unresponsive  ? ?  ?Objective:  ?  ?Physical Exam ?Vitals reviewed.  ?Constitutional:   ?   General: He is not in acute distress. ?    Appearance: He is obese. He is not diaphoretic.  ?   Comments: In wheelchair  ?HENT:  ?   Head: Normocephalic and atraumatic.  ?   Nose: Nose normal.  ?   Mouth/Throat:  ?   Mouth: Mucous membranes are moist.  ?Eyes:  ?   General:  No scleral icterus. ?   Extraocular Movements: Extraocular movements intact.  ?Cardiovascular:  ?   Rate and Rhythm: Normal rate and regular rhythm.  ?   Heart sounds: Normal heart sounds. No murmur heard. ?Pulmonary:  ?   Breath sounds: Normal breath sounds. No wheezing or rales.  ?Abdominal:  ?   Palpations: Abdomen is soft.  ?   Tenderness: There is no abdominal tenderness.  ?Musculoskeletal:     ?   General: Swelling (R wrist and hand) present.  ?   Cervical back: Neck supple. No tenderness.  ?   Right lower leg: Edema (2+) present.  ?   Left lower leg: Edema (2+) present.  ?Skin: ?   General: Skin is warm.  ?   Comments:  ?  ?Neurological:  ?   General: No focal deficit present.  ?   Mental Status: He is alert and oriented to person, place, and time.  ?   Sensory: Sensory deficit (R foot) present.  ?   Motor: Weakness (RUE - 1/5, B/l LE - 2/5) present.  ?Psychiatric:     ?   Mood and Affect: Mood normal.     ?   Behavior: Behavior normal.  ? ? ?BP 118/62 (BP Location: Left Arm, Patient Position: Sitting, Cuff Size: Normal)   Pulse (!) 105   Resp 18   Ht $R'5\' 11"'kC$  (1.803 m)   SpO2 99%   BMI 31.46 kg/m?  ?Wt Readings from Last 3 Encounters:  ?06/08/21 225 lb 8.5 oz (102.3 kg)  ?04/01/21 225 lb 8.5 oz (102.3 kg)  ?03/15/21 225 lb 8.5 oz (102.3 kg)  ? ? ?Lab Results  ?Component Value Date  ? TSH 0.921 03/13/2021  ? ?Lab Results  ?Component Value Date  ? WBC 9.1 04/03/2021  ? HGB 8.6 (L) 04/03/2021  ? HCT 29.3 (L) 04/03/2021  ? MCV 92.1 04/03/2021  ? PLT 309 04/03/2021  ? ?Lab Results  ?Component Value Date  ? NA 138 04/04/2021  ? K 3.7 04/04/2021  ? CO2 23 04/04/2021  ? GLUCOSE 102 (H) 04/04/2021  ? BUN 14 04/04/2021  ? CREATININE 1.18 04/04/2021  ? BILITOT 0.5 04/01/2021  ? ALKPHOS  116 04/01/2021  ? AST 15 04/01/2021  ? ALT 11 04/01/2021  ? PROT 7.0 04/01/2021  ? ALBUMIN 2.5 (L) 04/01/2021  ? CALCIUM 8.2 (L) 04/04/2021  ? ANIONGAP 7 04/04/2021  ? EGFR 60 03/21/2021  ? ?Lab Results  ?Component Va

## 2021-06-08 NOTE — Assessment & Plan Note (Addendum)
Likely multifactorial. ?Patient has history of CVA and dementia.  ?Slow recovery of consciousness and persistent weakness is concerning for seizure. ?Plan for neuro checks q 4 hrs ?Get EEG. ?Add thiamine and multivitamins. ?Not able to get detailed history due to dementia.  ? ?Check orthostatic vitals in am.  ?

## 2021-06-08 NOTE — ED Notes (Signed)
Pt not able to stand for last ortho vs ?

## 2021-06-08 NOTE — Assessment & Plan Note (Signed)
On aspirin and statin ?Had acute change in mental status, was unresponsive to verbal stimuli initially, later started noding head, but unable to speak ?BP wnl, blood glucose 141 ?HR and O2 sat wnl ?Referred to ER for evaluation for acute CVA - discussed with ER Physician ?

## 2021-06-08 NOTE — ED Notes (Signed)
Wasn't able to do in & out on pt. Wouldn't pass prostate into bladder. Blood on tip when taken out   ?

## 2021-06-08 NOTE — H&P (Signed)
?History and Physical  ? ? ?Patient: Mike Briggs CNO:[id number] DOB: Sep 26, 1928 ?DOA: 06/08/2021 ?DOS: the patient was seen and examined on 06/08/2021 ?PCP: [name], MD  ?Patient coming from: Home ? ?Chief Complaint:  ?Chief Complaint  ?Patient presents with  ? Loss of Consciousness  ? ?HPI: Mike Briggs is a 88 y.o. male with medical history significant of T2DM, hypertension, dementia, CVA and depression who was referred to the ED by his primary care after having a syncope episode at the outpatient clinic today.  ?While at the Primary care office went unresponsive he was not responsive to verbal stimuli but was responding to pain with eye movement. He was not able to move his left upper extremity and recovered slowly his consciousness. EMS was called and patient was brought to the ED for CVA evaluation.  ? ?When direct questioning patient does not remember the events that occurred today. He denies and recent change in his health.  ?He lives by himself. ?In the Ed he was very weak and not able to stand. He was referred for admission and further evaluation  ?  ?Review of Systems: unable to review all systems due to the inability of the patient to answer questions. ?Past Medical History:  ?Diagnosis Date  ? Acute cystitis 12/17/2018  ? Acute encephalopathy 11/14/2018  ? Acute metabolic encephalopathy 2/94/7654  ? ANKLE, ARTHRITIS, DEGEN./OSTEO 12/16/2008  ? Qualifier: Diagnosis of  By: Aline Brochure MD, Dorothyann Peng    ? Arthritis   ? Cataract   ? Chronic kidney disease   ? COVID-19   ? COVID-19 virus detected 11/03/2018  ? COVID-19 virus infection 11/03/2018  ? Dehydration 11/02/2018  ? Depression   ? Diabetes mellitus   ? Gram-positive bacteremia 11/03/2018  ? Hyperlipidemia   ? Hypertension   ? Hypomagnesemia 03/24/2019  ? Klebsiella UTI  03/25/2019  ? Pain in finger of right hand 05/27/2019  ? Personal history of gout 05/22/2016  ? Rhabdomyolysis 11/02/2018  ? Sepsis secondary to UTI ([place]) 03/13/2021  ? Stroke [facility])   ?  Swelling of both hands 05/27/2019  ? Syncope 02/25/2018  ? TIA (transient ischemic attack) 11/14/2018  ? UTI (urinary tract infection) 02/26/2018  ? Vitamin D deficiency 05/31/2016  ? ?Past Surgical History:  ?Procedure Laterality Date  ? BACK SURGERY    ? cervical and lumbar  ? CATARACT EXTRACTION    ? CHOLECYSTECTOMY    ? FOOT SURGERY    ? 5th toe   ? SPINE SURGERY    ? lumbar and cervical fusions  ? ?Social History:  reports that he quit smoking about 53 years ago. His smoking use included cigarettes. He has never used smokeless tobacco. He reports that he does not drink alcohol and does not use drugs. ? ?No Known Allergies ? ?Family History  ?Family history unknown: Yes  ? ? ?Prior to Admission medications   ?Medication Sig Start Date End Date Taking? Authorizing Provider  ?amLODipine (NORVASC) 10 MG tablet TAKE ONE TABLET BY MOUTH ONCE DAILY 03/28/21   [name], MD  ?aspirin 81 MG chewable tablet Chew 1 tablet (81 mg total) by mouth daily with breakfast. 12/04/19   Perlie Mayo, NP  ?atorvastatin (LIPITOR) 10 MG tablet TAKE 1 TABLET BY MOUTH AFTER SUPPER. 03/28/21   [name], MD  ?blood glucose meter kit and supplies Dispense based on patient and insurance preference. Use up to four times daily as directed. (FOR ICD-10 E10.9, E11.9). 04/05/20   [name],  MD  ?diclofenac Sodium (VOLTAREN) 1 % GEL Apply 4 g topically 3 (three) times daily as needed (arthritic wrist pain). 04/04/21   Barton Dubois, MD  ?LANTUS SOLOSTAR 100 UNIT/ML Solostar Pen Inject 20 Units into the skin daily. 10/10/20   [name], MD  ?lisinopril (ZESTRIL) 40 MG tablet Take 1 tablet (40 mg total) by mouth daily. 04/19/21   [name], MD  ?pantoprazole (PROTONIX) 40 MG tablet TAKE ONE TABLET BY MOUTH ONCE DAILY. 04/26/21   [name], MD  ?SURE COMFORT PEN NEEDLES 31G X 5 MM MISC USE TO INJECT AS DIRECTED FOUR TIMES A DAY. WITH MEALS AND BEFORE BEDTIME. 08/17/20   [name], MD  ?tamsulosin (FLOMAX) 0.4 MG CAPS  capsule TAKE 1 CAPSULE BY MOUTH DAILY AFTER BREAKFAST. 05/26/21   Irine Seal, MD  ?trimethoprim (TRIMPEX) 100 MG tablet Take 1 tablet (100 mg total) by mouth daily. 04/08/21   Barton Dubois, MD  ? ? ?Physical Exam: ?Vitals:  ? 06/08/21 1838 06/08/21 1930 06/08/21 2030 06/08/21 2130  ?BP: (!) 138/58 137/78 137/81 (!) 149/74  ?Pulse: 89 61 (!) 59 71  ?Resp: 17 14 (!) 21 17  ?SpO2: 97% 96% 98% 97%  ?Weight:      ?Height:      ? ?Neurology awake and alert, non focal, able to respond simple questions, he is orientated times place, person and time ?ENT with mild pallor ?Cardiovascular with S1 and S2 present and rhythmic with no gallops, rubs or murmurs ?Respiratory with no rales or rhonchi ?Abdomen not distended ?No lower extremity edema.  ?Data Reviewed: ? ? ?88 yo male who lives by himself, with baseline dementia who sustained and episode of altered mental status at his primary care office. He was only able to respond to pain with eye movement and recovered slowly his consciousness. In the Ed he is not focal, his vitals signs are stable with blood pressure systolic 161 mmHg. He is not able to stand due to severe weakness.  ? ?Na 140, K 3,7 Cl 109, bicarbonate 23, glucose 126, bun 17 cr 1,34  ?Wbc 13,1 hgb 10.6 plt 224  ? ?Head CT with no acute changes.  ? ?EKG with 84 bpm, left axis deviation, normal qtc, sinus rhythm with 2nd degree AV block type 1, no significant ST segment or T wave changes.  ? ?Patient is admitted with the working diagnosis of transient loss of consciousness.  ? ?Assessment and Plan: ?Syncope ?Likely multifactorial. ?Patient has history of CVA and dementia.  ?Slow recovery of consciousness and persistent weakness is concerning for seizure. ?Plan for neuro checks q 4 hrs ?Get EEG. ?Add thiamine and multivitamins. ?Not able to get detailed history due to dementia.  ? ?T2DM (type 2 diabetes mellitus) ([place]) ?Random glucose stable ? ?Plan to continue glucose cover and monitoring with insulin sliding  scale.  ?Continue basal insulin at a lower dose to prevent hypoglycemia, consult nutrition for evaluation.  ? ?Hypertension ?Stable blood pressure ?Continue antihypertensive regimen.  ? ?Chronic kidney disease ?Stable renal function with serum cr at 3A. ? ?Plan to add IV fluids and follow up renal function in am. ?Avoid hypotension and nephrotoxic medications.  ? ? ? ? ? Advance Care Planning:   Code Status: Prior  ? ?Consults: none  ? ?Family Communication: no family at the bedside  ? ?Severity of Illness: ?The appropriate patient status for this patient is OBSERVATION. Observation status is judged to be reasonable and necessary in order to provide the  required intensity of service to ensure the patient's safety. The patient's presenting symptoms, physical exam findings, and initial radiographic and laboratory data in the context of their medical condition is felt to place them at decreased risk for further clinical deterioration. Furthermore, it is anticipated that the patient will be medically stable for discharge from the hospital within 2 midnights of admission.  ? ?Author: ?Tawni Millers, MD ?06/08/2021 10:53 PM ? ?For on call review [email or web address].  ?

## 2021-06-08 NOTE — Assessment & Plan Note (Addendum)
Stable blood pressure ?Hold on blood pressure medications for now and add IV fluids for volume repletion. ? ?

## 2021-06-08 NOTE — Assessment & Plan Note (Addendum)
Random glucose stable ? ?Plan to continue glucose cover and monitoring with insulin sliding scale.  ?Continue basal insulin at a lower dose to prevent hypoglycemia, consult nutrition for evaluation.  ?

## 2021-06-09 ENCOUNTER — Observation Stay (HOSPITAL_COMMUNITY): Payer: Medicare HMO

## 2021-06-09 ENCOUNTER — Observation Stay (HOSPITAL_BASED_OUTPATIENT_CLINIC_OR_DEPARTMENT_OTHER): Payer: Medicare HMO

## 2021-06-09 ENCOUNTER — Observation Stay (HOSPITAL_COMMUNITY)
Admit: 2021-06-09 | Discharge: 2021-06-09 | Disposition: A | Discharge status: Home / Self Care | Payer: Medicare HMO | Attending: Internal Medicine | Admitting: Internal Medicine

## 2021-06-09 DIAGNOSIS — R42 Dizziness and giddiness: Secondary | ICD-10-CM | POA: Diagnosis not present

## 2021-06-09 DIAGNOSIS — I493 Ventricular premature depolarization: Secondary | ICD-10-CM | POA: Diagnosis not present

## 2021-06-09 DIAGNOSIS — R55 Syncope and collapse: Secondary | ICD-10-CM

## 2021-06-09 DIAGNOSIS — I10 Essential (primary) hypertension: Secondary | ICD-10-CM | POA: Diagnosis not present

## 2021-06-09 DIAGNOSIS — R9431 Abnormal electrocardiogram [ECG] [EKG]: Secondary | ICD-10-CM | POA: Diagnosis not present

## 2021-06-09 DIAGNOSIS — I4891 Unspecified atrial fibrillation: Secondary | ICD-10-CM

## 2021-06-09 DIAGNOSIS — N183 Chronic kidney disease, stage 3 unspecified: Secondary | ICD-10-CM | POA: Diagnosis not present

## 2021-06-09 DIAGNOSIS — E1122 Type 2 diabetes mellitus with diabetic chronic kidney disease: Secondary | ICD-10-CM | POA: Diagnosis not present

## 2021-06-09 DIAGNOSIS — R001 Bradycardia, unspecified: Secondary | ICD-10-CM | POA: Diagnosis not present

## 2021-06-09 LAB — CBC
HCT: 35.9 % — ABNORMAL LOW (ref 39.0–52.0)
Hemoglobin: 10.8 g/dL — ABNORMAL LOW (ref 13.0–17.0)
MCH: 26.9 pg (ref 26.0–34.0)
MCHC: 30.1 g/dL (ref 30.0–36.0)
MCV: 89.3 fL (ref 80.0–100.0)
Platelets: 246 10*3/uL (ref 150–400)
RBC: 4.02 MIL/uL — ABNORMAL LOW (ref 4.22–5.81)
RDW: 17.4 % — ABNORMAL HIGH (ref 11.5–15.5)
WBC: 12.8 10*3/uL — ABNORMAL HIGH (ref 4.0–10.5)
nRBC: 0 % (ref 0.0–0.2)

## 2021-06-09 LAB — TSH: TSH: 0.381 u[IU]/mL (ref 0.350–4.500)

## 2021-06-09 LAB — BASIC METABOLIC PANEL
Anion gap: 8 (ref 5–15)
BUN: 17 mg/dL (ref 8–23)
CO2: 22 mmol/L (ref 22–32)
Calcium: 8.3 mg/dL — ABNORMAL LOW (ref 8.9–10.3)
Chloride: 110 mmol/L (ref 98–111)
Creatinine, Ser: 1.08 mg/dL (ref 0.61–1.24)
GFR, Estimated: >60 mL/min (ref >60)
Glucose, Bld: 116 mg/dL — ABNORMAL HIGH (ref 70–99)
Potassium: 3.3 mmol/L — ABNORMAL LOW (ref 3.5–5.1)
Sodium: 140 mmol/L (ref 135–145)

## 2021-06-09 LAB — T4, FREE: Free T4: 1.48 ng/dL — ABNORMAL HIGH (ref 0.61–1.12)

## 2021-06-09 LAB — GLUCOSE, CAPILLARY
Glucose-Capillary: 117 mg/dL — ABNORMAL HIGH (ref 70–99)
Glucose-Capillary: 195 mg/dL — ABNORMAL HIGH (ref 70–99)
Glucose-Capillary: 205 mg/dL — ABNORMAL HIGH (ref 70–99)
Glucose-Capillary: 142 mg/dL — ABNORMAL HIGH (ref 70–99)
Glucose-Capillary: 141 mg/dL — ABNORMAL HIGH (ref 70–99)

## 2021-06-09 LAB — D-DIMER, QUANTITATIVE: D-Dimer, Quant: 3.33 ug/mL-FEU — ABNORMAL HIGH (ref 0.00–0.50)

## 2021-06-09 LAB — TROPONIN I (HIGH SENSITIVITY)
Troponin I (High Sensitivity): 27 ng/L — ABNORMAL HIGH (ref <18)
Troponin I (High Sensitivity): 29 ng/L — ABNORMAL HIGH (ref <18)

## 2021-06-09 LAB — MAGNESIUM: Magnesium: 1.4 mg/dL — ABNORMAL LOW (ref 1.7–2.4)

## 2021-06-09 MED ORDER — ACETAMINOPHEN 325 MG PO TABS
650.0000 mg | ORAL_TABLET | Freq: Four times a day (QID) | ORAL | Status: DC | PRN
  Administered 2021-06-09 (×2): 650 mg via ORAL
  Filled 2021-06-09 (×3): qty 2

## 2021-06-09 MED ORDER — INSULIN ASPART 100 UNIT/ML IJ SOLN
0.0000 [IU] | Freq: Three times a day (TID) | INTRAMUSCULAR | Status: DC
  Administered 2021-06-09: 3 [IU] via SUBCUTANEOUS
  Administered 2021-06-09: 1 [IU] via SUBCUTANEOUS
  Administered 2021-06-10: 2 [IU] via SUBCUTANEOUS
  Administered 2021-06-10 – 2021-06-12 (×3): 1 [IU] via SUBCUTANEOUS
  Administered 2021-06-13: 2 [IU] via SUBCUTANEOUS

## 2021-06-09 MED ORDER — ATORVASTATIN CALCIUM 10 MG PO TABS
10.0000 mg | ORAL_TABLET | Freq: Every day | ORAL | Status: DC
  Administered 2021-06-09 – 2021-06-13 (×5): 10 mg via ORAL
  Filled 2021-06-09 (×5): qty 1

## 2021-06-09 MED ORDER — POTASSIUM CHLORIDE 10 MEQ/100ML IV SOLN
10.0000 meq | INTRAVENOUS | Status: AC
  Administered 2021-06-09 (×4): 10 meq via INTRAVENOUS
  Filled 2021-06-09: qty 100

## 2021-06-09 MED ORDER — SODIUM CHLORIDE 0.9 % IV SOLN: INTRAVENOUS | Status: DC

## 2021-06-09 MED ORDER — ASPIRIN 81 MG PO CHEW
81.0000 mg | CHEWABLE_TABLET | Freq: Every day | ORAL | Status: DC
Start: 2021-06-09 — End: 2021-06-13
  Administered 2021-06-09 – 2021-06-13 (×5): 81 mg via ORAL
  Filled 2021-06-09 (×5): qty 1

## 2021-06-09 MED ORDER — TAMSULOSIN HCL 0.4 MG PO CAPS
0.4000 mg | ORAL_CAPSULE | Freq: Every day | ORAL | Status: DC
  Administered 2021-06-09 – 2021-06-12 (×4): 0.4 mg via ORAL
  Filled 2021-06-09 (×4): qty 1

## 2021-06-09 MED ORDER — ACETAMINOPHEN 650 MG RE SUPP: 650.0000 mg | Freq: Four times a day (QID) | RECTAL | Status: DC | PRN

## 2021-06-09 MED ORDER — ENOXAPARIN SODIUM 60 MG/0.6ML IJ SOSY
50.0000 mg | PREFILLED_SYRINGE | INTRAMUSCULAR | Status: DC
  Administered 2021-06-09 – 2021-06-13 (×5): 50 mg via SUBCUTANEOUS
  Filled 2021-06-09 (×5): qty 0.6

## 2021-06-09 MED ORDER — INSULIN GLARGINE-YFGN 100 UNIT/ML ~~LOC~~ SOLN
10.0000 [IU] | Freq: Every day | SUBCUTANEOUS | Status: DC
Start: 2021-06-09 — End: 2021-06-13
  Administered 2021-06-09 – 2021-06-13 (×3): 10 [IU] via SUBCUTANEOUS
  Filled 2021-06-09 (×6): qty 0.1

## 2021-06-09 MED ORDER — ONDANSETRON HCL 4 MG PO TABS: 4.0000 mg | ORAL_TABLET | Freq: Four times a day (QID) | ORAL | Status: DC | PRN

## 2021-06-09 MED ORDER — POLYETHYLENE GLYCOL 3350 17 G PO PACK
17.0000 g | PACK | Freq: Every day | ORAL | Status: DC
  Administered 2021-06-09 – 2021-06-13 (×5): 17 g via ORAL
  Filled 2021-06-09 (×5): qty 1

## 2021-06-09 MED ORDER — POTASSIUM CHLORIDE CRYS ER 20 MEQ PO TBCR
40.0000 meq | EXTENDED_RELEASE_TABLET | Freq: Once | ORAL | Status: AC
  Administered 2021-06-09: 40 meq via ORAL
  Filled 2021-06-09: qty 2

## 2021-06-09 MED ORDER — IOHEXOL 350 MG/ML SOLN
80.0000 mL | Freq: Once | INTRAVENOUS | Status: AC | PRN
  Administered 2021-06-09: 80 mL via INTRAVENOUS

## 2021-06-09 MED ORDER — TAB-A-VITE/IRON PO TABS
1.0000 | ORAL_TABLET | Freq: Every day | ORAL | Status: DC
  Administered 2021-06-09 – 2021-06-13 (×5): 1 via ORAL
  Filled 2021-06-09 (×5): qty 1

## 2021-06-09 MED ORDER — MAGNESIUM SULFATE 4 GM/100ML IV SOLN
4.0000 g | Freq: Once | INTRAVENOUS | Status: AC
  Administered 2021-06-09: 4 g via INTRAVENOUS

## 2021-06-09 MED ORDER — MAGNESIUM SULFATE 4 GM/100ML IV SOLN
INTRAVENOUS | Status: AC
  Filled 2021-06-09: qty 100

## 2021-06-09 MED ORDER — SENNOSIDES-DOCUSATE SODIUM 8.6-50 MG PO TABS
2.0000 | ORAL_TABLET | Freq: Every day | ORAL | Status: DC
  Administered 2021-06-10 – 2021-06-12 (×3): 2 via ORAL
  Filled 2021-06-09 (×4): qty 2

## 2021-06-09 MED ORDER — ONDANSETRON HCL 4 MG/2ML IJ SOLN: 4.0000 mg | Freq: Four times a day (QID) | INTRAMUSCULAR | Status: DC | PRN

## 2021-06-09 MED ORDER — PANTOPRAZOLE SODIUM 40 MG PO TBEC
40.0000 mg | DELAYED_RELEASE_TABLET | Freq: Every day | ORAL | Status: DC
  Administered 2021-06-09 – 2021-06-13 (×5): 40 mg via ORAL
  Filled 2021-06-09 (×5): qty 1

## 2021-06-09 MED ORDER — ENSURE MAX PROTEIN PO LIQD
11.0000 [oz_av] | Freq: Every day | ORAL | Status: DC
  Administered 2021-06-09 – 2021-06-13 (×4): 11 [oz_av] via ORAL

## 2021-06-09 MED ORDER — THIAMINE HCL 100 MG PO TABS
250.0000 mg | ORAL_TABLET | Freq: Every day | ORAL | Status: DC
  Administered 2021-06-09 – 2021-06-13 (×5): 250 mg via ORAL
  Filled 2021-06-09 (×5): qty 3

## 2021-06-09 NOTE — Evaluation (Signed)
Physical Therapy Evaluation ?Patient Details ?Name: Mike Briggs ?MRN: [id number] ?DOB: 11-16-1928 ?Today's Date: 06/09/2021 ? ?History of Present Illness ? Mike Briggs is a 88 y.o. male with medical history significant of T2DM, hypertension, dementia, CVA and depression who was referred to the ED by his primary care after having a syncope episode at the outpatient clinic today.   While at the Primary care office went unresponsive he was not responsive to verbal stimuli but was responding to pain with eye movement. He was not able to move his left upper extremity and recovered slowly his consciousness. EMS was called and patient was brought to the ED for CVA evaluation. ?  ?Clinical Impression ? Patient functioning near baseline for mobility as he requires mod/max assist for mobility, uses wheelchair, and is assisted for all ADL by aid. Patient requiring assist to transition to seated EOB and is assisted to chair at bedside with max assist. Patient left in chair at end of session. Patient discharged to care of nursing for ambulation daily as tolerated for length of stay. Nursing later asked for assist to get patient from chair to [facility] next to chair due to heavy assist level needed. Patient assisted to [facility] and left with nursing. Came back to check if nursing needed help with patient transfer to find patient somewhat lethargic on BSC. Requested nursing come help with patient and patient was becoming less responsive. RN arrived and initiated code. Patient left with care team.  ?  ?   ? ?Recommendations for follow up therapy are one component of a multi-disciplinary discharge planning process, led by the attending physician.  Recommendations may be updated based on patient status, additional functional criteria and insurance authorization. ? ?Follow Up Recommendations Home health PT ? ?  ?Assistance Recommended at Discharge Frequent or constant Supervision/Assistance  ?Patient can return home with the following ? A  lot of help with walking and/or transfers;A lot of help with bathing/dressing/bathroom;Assistance with cooking/housework;Help with stairs or ramp for entrance;Assistance with feeding;Assist for transportation ? ?  ?Equipment Recommendations None recommended by PT  ?Recommendations for Other Services ?    ?  ?Functional Status Assessment Patient has had a recent decline in their functional status and demonstrates the ability to make significant improvements in function in a reasonable and predictable amount of time.  ? ?  ?Precautions / Restrictions Precautions ?Precautions: Fall ?Restrictions ?Weight Bearing Restrictions: No  ? ?  ? ?Mobility ? Bed Mobility ?Overal bed mobility: Needs Assistance ?Bed Mobility: Supine to Sit ?  ?  ?Supine to sit: Mod assist, HOB elevated ?  ?  ?General bed mobility comments: labored movement with need for assist to push up from L side. ?  ? ?Transfers ?Overall transfer level: Needs assistance ?  ?Transfers: Sit to/from Stand, Bed to chair/wheelchair/BSC ?Sit to Stand: Max assist ?Stand pivot transfers: Max assist ?  ?  ?  ?  ?General transfer comment: PT assisting pt without use of RW. Max A needed with pivot to chair without steps. ?  ? ?Ambulation/Gait ?  ?  ?  ?  ?  ?  ?  ?  ? ?Stairs ?  ?  ?  ?  ?  ? ?Wheelchair Mobility ?  ? ?Modified Rankin (Stroke Patients Only) ?  ? ?  ? ?Balance Overall balance assessment: Needs assistance ?Sitting-balance support: Feet supported, Single extremity supported ?Sitting balance-Leahy Scale: Good ?Sitting balance - Comments: seated EOB ?  ?Standing balance support: During functional activity, Bilateral upper extremity  supported ?Standing balance-Leahy Scale: Zero ?Standing balance comment: PT assisting anteriorly ?  ?  ?  ?  ?  ?  ?  ?  ?  ?  ?  ?   ? ? ? ?Pertinent Vitals/Pain Pain Assessment ?Pain Assessment: Faces ?Faces Pain Scale: Hurts a little bit ?Pain Descriptors / Indicators: Sore ?Pain Intervention(s): Limited activity within patient's  tolerance, Monitored during session, Repositioned  ? ? ?Home Living Family/patient expects to be discharged to:: Private residence ?Living Arrangements: Alone ?Available Help at Discharge: Personal care attendant;Family;Available PRN/intermittently ?Type of Home: Apartment ?Home Access: Level entry ?  ?  ?  ?Home Layout: One level ?Home Equipment: Rolling Walker (2 wheels);BSC/3in1;Grab bars - tub/shower;Grab bars - toilet;Electric scooter;Hospital bed;Other (comment) ?Additional Comments: uses overhead trapeze; care givers present 8AM to 7 PM.  ?  ?Prior Function Prior Level of Function : Needs assist ?  ?  ?  ?Physical Assist : Mobility (physical);ADLs (physical) ?Mobility (physical): Bed mobility;Transfers;Gait;Stairs ?ADLs (physical): Bathing;Dressing;Toileting;Feeding;IADLs;Grooming ?Mobility Comments: non-ambulatory, assisted for scooting over from bed to power scooter, uses overhead trapeze and assistance for bed mobility ?ADLs Comments: assisted by family, home aides 8 am to 7 pm 5 days/week ?  ? ? ?Hand Dominance  ? Dominant Hand: Right ? ?  ?Extremity/Trunk Assessment  ? Upper Extremity Assessment ?Upper Extremity Assessment: Defer to OT evaluation ?RUE Deficits / Details: 2-/5 shoulder flexion; 3-/5 elbow flexion; 2-/5 wrist flexion and extension; 3-/5 grip. Deficits from previous stroke. ?RUE Sensation: decreased light touch (numbness and tingling) ?RUE Coordination: decreased fine motor;decreased gross motor ?LUE Deficits / Details: Generally weak. Decreased fine motor coordination. ?LUE Coordination: decreased fine motor ?  ? ?Lower Extremity Assessment ?Lower Extremity Assessment: Generalized weakness ?  ? ?Cervical / Trunk Assessment ?Cervical / Trunk Assessment: Normal  ?Communication  ? Communication: No difficulties  ?Cognition Arousal/Alertness: Awake/alert ?Behavior During Therapy: [facility] for tasks assessed/performed ?Overall Cognitive Status: History of cognitive impairments - at baseline ?  ?  ?   ?  ?  ?  ?  ?  ?  ?  ?  ?  ?  ?  ?  ?  ?  ?  ?  ? ?  ?General Comments   ? ?  ?Exercises    ? ?Assessment/Plan  ?  ?PT Assessment All further PT needs can be met in the next venue of care  ?PT Problem List Decreased strength;Decreased mobility;Decreased activity tolerance;Decreased balance ? ?   ?  ?PT Treatment Interventions DME instruction;Therapeutic exercise;Gait training;Balance training;Stair training;Neuromuscular re-education;Functional mobility training;Therapeutic activities;Patient/family education   ? ?PT Goals (Current goals can be found in the Care Plan section)  ?Acute Rehab PT Goals ?Patient Stated Goal: return home with caregivers to assist ?PT Goal Formulation: With patient ?Time For Goal Achievement: 06/09/21 ?Potential to Achieve Goals: Fair ? ?  ?Frequency   ?  ? ? ?Co-evaluation PT/OT/SLP Co-Evaluation/Treatment: Yes ?Reason for Co-Treatment: To address functional/ADL transfers ?PT goals addressed during session: Mobility/safety with mobility;Strengthening/ROM ?OT goals addressed during session: ADL's and self-care ?  ? ? ?  ?AM-PAC PT "6 Clicks" Mobility  ?Outcome Measure Help needed turning from your back to your side while in a flat bed without using bedrails?: A Lot ?Help needed moving from lying on your back to sitting on the side of a flat bed without using bedrails?: A Lot ?Help needed moving to and from a bed to a chair (including a wheelchair)?: Total ?Help needed standing up from a chair using your arms (e.g., wheelchair  or bedside chair)?: Total ?Help needed to walk in hospital room?: Total ?Help needed climbing 3-5 steps with a railing? : Total ?6 Click Score: 8 ? ?  ?End of Session Equipment Utilized During Treatment: Gait belt ?Activity Tolerance: Patient tolerated treatment well ?Patient left: in chair;with call bell/phone within reach;with chair alarm set ?Nurse Communication: Mobility status ?PT Visit Diagnosis: Other abnormalities of gait and mobility (R26.89);Muscle weakness  (generalized) (M62.81) ?  ? ?Time: 7366-8159 ?PT Time Calculation (min) (ACUTE ONLY): 27 min ? ? ?Charges:   PT Evaluation ?$PT Eval Moderate Complexity: 1 Mod ?PT Treatments ?$Therapeutic Activity

## 2021-06-09 NOTE — Consult Note (Addendum)
?Cardiology Consultation:  ? ?Patient ID: Mike Briggs ?MRN: [id number]; DOB: 03-Mar-1928 ? ?Admit date: 06/08/2021 ?Date of Consult: 06/09/2021 ? ?PCP:  [name], MD ?  ?[facility] Providers ?Cardiologist:  Rozann Lesches, MD   { ? ? ?Patient Profile:  ? ?Mike Briggs is a 88 y.o. male with a hx of HTN, HLD, Type 2 DM, Stage III CKD, dementia and prior TIA who is being seen 06/09/2021 for the evaluation of syncope and possible atrial fibrillation at the request of Dr. [place] Pink. ? ?History of Present Illness:  ? ?Mr. Sipp was last examined by the cardiology service during an admission in 03/2021 for episodes of bradycardia with heart rate in the 30's at times. He was admitted for a UTI and denied any associated symptoms at that time. He had previously been evaluated for bradycardia and found to have 2:1 heart block but was not felt to be a PPM candidate given his age, minimal ambulatory status and dementia and was again not felt to be a candidate. ? ?The patient is unable to elaborate on what brought him to the hospital yesterday but by review of notes, he was at his PCP's office when he had an episode of unresponsiveness and was not responding to verbal stimuli but would respond to painful stimuli. EMS was therefore called for transport to the Emergency Department.  ? ?Initial labs showed WBC 13.1, Hgb 10.6, platelets 224, K+ 140, K+ 3.7 and creatinine 1.34.  Albumin 2.6. Initial and repeat Hs Troponin flat at 34 and 31.  Magnesium 1.4.  TSH 0.381. CXR showed no active disease. CT Head showed no acute intracranial abnormalities. A Brain MRI was ordered for later today along with an EEG. EKG on admission showed normal sinus rhythm, heart rate 84 with first-degree AV block and frequent PVC's. He was noted on telemetry this morning to have gone into possible atrial fibrillation overnight with rates peaking in the 130's but currently in the 70's to 80's.  ? ?Around 1015 this morning, a "CODE BLUE" was  called but in talking with the patient's nurse, it appears he had been on the bedside commode having a bowel movement and when they came back to check on the patient he was unresponsive. There was no definitive loss of pulses and he did become arousable. By review of telemetry during that timeframe, he was in NSR with 1st degree AV block and episodes of Mobitz I with rates in the 70's to 80's and did have frequent PVC's but no sustained arrhythmias or significant pauses. ? ?Past Medical History:  ?Diagnosis Date  ? Acute cystitis 12/17/2018  ? Acute encephalopathy 11/14/2018  ? Acute metabolic encephalopathy 3/74/8270  ? ANKLE, ARTHRITIS, DEGEN./OSTEO 12/16/2008  ? Qualifier: Diagnosis of  By: Aline Brochure MD, Dorothyann Peng    ? Arthritis   ? Cataract   ? Chronic kidney disease   ? COVID-19   ? COVID-19 virus detected 11/03/2018  ? COVID-19 virus infection 11/03/2018  ? Dehydration 11/02/2018  ? Depression   ? Diabetes mellitus   ? Gram-positive bacteremia 11/03/2018  ? Hyperlipidemia   ? Hypertension   ? Hypomagnesemia 03/24/2019  ? Klebsiella UTI  03/25/2019  ? Pain in finger of right hand 05/27/2019  ? Personal history of gout 05/22/2016  ? Rhabdomyolysis 11/02/2018  ? Sepsis secondary to UTI ([place]) 03/13/2021  ? Stroke [facility])   ? Swelling of both hands 05/27/2019  ? Syncope 02/25/2018  ? TIA (transient ischemic attack) 11/14/2018  ? UTI (urinary tract  infection) 02/26/2018  ? Vitamin D deficiency 05/31/2016  ? ? ?Past Surgical History:  ?Procedure Laterality Date  ? BACK SURGERY    ? cervical and lumbar  ? CATARACT EXTRACTION    ? CHOLECYSTECTOMY    ? FOOT SURGERY    ? 5th toe   ? SPINE SURGERY    ? lumbar and cervical fusions  ?  ? ?Home Medications:  ?Prior to Admission medications   ?Medication Sig Start Date End Date Taking? Authorizing Provider  ?amLODipine (NORVASC) 10 MG tablet TAKE ONE TABLET BY MOUTH ONCE DAILY 03/28/21   [name], MD  ?aspirin 81 MG chewable tablet Chew 1 tablet (81 mg total) by mouth daily with breakfast.  12/04/19   Perlie Mayo, NP  ?atorvastatin (LIPITOR) 10 MG tablet TAKE 1 TABLET BY MOUTH AFTER SUPPER. 03/28/21   [name], MD  ?blood glucose meter kit and supplies Dispense based on patient and insurance preference. Use up to four times daily as directed. (FOR ICD-10 E10.9, E11.9). 04/05/20   [name], MD  ?diclofenac Sodium (VOLTAREN) 1 % GEL Apply 4 g topically 3 (three) times daily as needed (arthritic wrist pain). 04/04/21   Barton Dubois, MD  ?LANTUS SOLOSTAR 100 UNIT/ML Solostar Pen Inject 20 Units into the skin daily. 10/10/20   [name], MD  ?lisinopril (ZESTRIL) 40 MG tablet Take 1 tablet (40 mg total) by mouth daily. 04/19/21   [name], MD  ?pantoprazole (PROTONIX) 40 MG tablet TAKE ONE TABLET BY MOUTH ONCE DAILY. 04/26/21   [name], MD  ?SURE COMFORT PEN NEEDLES 31G X 5 MM MISC USE TO INJECT AS DIRECTED FOUR TIMES A DAY. WITH MEALS AND BEFORE BEDTIME. 08/17/20   [name], MD  ?tamsulosin (FLOMAX) 0.4 MG CAPS capsule TAKE 1 CAPSULE BY MOUTH DAILY AFTER BREAKFAST. 05/26/21   Irine Seal, MD  ?trimethoprim (TRIMPEX) 100 MG tablet Take 1 tablet (100 mg total) by mouth daily. 04/08/21   Barton Dubois, MD  ? ? ?Inpatient Medications: ?Scheduled Meds: ? aspirin  81 mg Oral Q breakfast  ? atorvastatin  10 mg Oral Daily  ? enoxaparin (LOVENOX) injection  50 mg Subcutaneous Q24H  ? insulin aspart  0-9 Units Subcutaneous TID WC  ? insulin glargine-yfgn  10 Units Subcutaneous Daily  ? multivitamins with iron  1 tablet Oral Daily  ? pantoprazole  40 mg Oral Daily  ? tamsulosin  0.4 mg Oral QPC supper  ? thiamine  250 mg Oral Daily  ? ?Continuous Infusions: ? sodium chloride 75 mL/hr at 06/09/21 0230  ? magnesium sulfate bolus IVPB 4 g (06/09/21 1032)  ? potassium chloride    ? ?PRN Meds: ?acetaminophen **OR** acetaminophen, ondansetron **OR** ondansetron (ZOFRAN) IV ? ?Allergies:   No Known Allergies ? ?Social History:   ?Social History  ? ?Socioeconomic History  ? Marital  status: Divorced  ?  Spouse name: Not on file  ? Number of children: 3  ? Years of education: 10  ? Highest education level: Not on file  ?Occupational History  ? Occupation: Retired.  ?Tobacco Use  ? Smoking status: Former  ?  Types: Cigarettes  ?  Quit date: 08/07/1967  ?  Years since quitting: 53.8  ? Smokeless tobacco: Never  ?Vaping Use  ? Vaping Use: Never used  ?Substance and Sexual Activity  ? Alcohol use: No  ? Drug use: No  ? Sexual activity: Not Currently  ?Other Topics Concern  ? Not on  file  ?Social History Narrative  ? Wheelchair bound.Has not walked in many years, at least five years.  Lives alone. Has 2 aides that come in to help him.  ? Lives alone.   ? Retired.  ? Eats all food groups.   ? Has 3 children but does not have contact with them.  ? Divorced.  ? ?Social Determinants of Health  ? ?Financial Resource Strain: Low Risk   ? Difficulty of Paying Living Expenses: Not hard at all  ?Food Insecurity: No Food Insecurity  ? Worried About Charity fundraiser in the Last Year: Never true  ? Ran Out of Food in the Last Year: Never true  ?Transportation Needs: No Transportation Needs  ? Lack of Transportation (Medical): No  ? Lack of Transportation (Non-Medical): No  ?Physical Activity: Inactive  ? Days of Exercise per Week: 0 days  ? Minutes of Exercise per Session: 0 min  ?Stress: Stress Concern Present  ? Feeling of Stress : To some extent  ?Social Connections: Socially Isolated  ? Frequency of Communication with Friends and Family: Never  ? Frequency of Social Gatherings with Friends and Family: Once a week  ? Attends Religious Services: Never  ? Active Member of Clubs or Organizations: No  ? Attends Archivist Meetings: Never  ? Marital Status: Divorced  ?Intimate Partner Violence: Not At Risk  ? Fear of Current or Ex-Partner: No  ? Emotionally Abused: No  ? Physically Abused: No  ? Sexually Abused: No  ?  ?Family History:   ? ?Family History  ?Family history unknown: Yes  ?  ? ?ROS:   ?Please see the history of present illness.  ? ?All other ROS reviewed and negative.    ? ?Physical Exam/Data:  ? ?Vitals:  ? 06/09/21 1016 06/09/21 1019 06/09/21 1035 06/09/21 1057  ?BP: 119/80 (!) 140/91 Marland Kitchen)

## 2021-06-09 NOTE — Progress Notes (Addendum)
? ?[facility] ?PROGRESS NOTE ? ? ?[name] TWS:[id number] DOB: Sep 12, 1928 DOA: 06/08/2021 ? ?PCP: Mike Spar, MD ? ?Brief History/Interval Summary: 88 y.o. male with medical history significant of T2DM, hypertension, dementia, CVA and depression who was referred to the ED by his primary care after having a syncope episode at the outpatient clinic. While at the Primary care office went unresponsive he was not responsive to verbal stimuli but was responding to pain with eye movement. He was not able to move his left upper extremity and recovered slowly his consciousness. EMS was called and patient was brought to the ED for CVA evaluation.  He had no recollection of these events.  CT head did not show any acute findings.  He was hospitalized back in March for urinary tract infection.  He was hospitalized in February for acute encephalopathy thought to be secondary to UTI.  Hospitalized back in December for atypical chest pain and dysphagia.  Found to have Mobitz type I AV block at that time. ? ?Consultants: Cardiology ? ?Procedures: Transthoracic echocardiogram will be ordered ? ? ? ?Subjective/Interval History: ?Patient is slightly distracted but able to answer all my questions appropriately.  Denies any chest pain shortness of breath.  He does have some weakness in the right arm which he mentions is old.  Does not have any recollection of events from yesterday. ? ? ? ?Assessment/Plan: ? ?ADDENDUM ?Informed by Dr. Roderic Palau who is covering at [facility] that patient apparently had another syncopal episode while he was trying to use the bathroom.  Could have had a vasovagal episode.  He quickly regained consciousness.  Vital signs noted to be stable subsequently.  Responding appropriately.  Continue to monitor on telemetry.  D-dimer and troponin levels have been ordered.  We will follow-up on these. ? ?Syncope ?Etiology is unclear.  Apparently he was in his primary care physician's office when he suddenly  passed out.  Arrhythmia is on the top of the differential.  Continue with telemetry.  EKG shows irregular rhythm.  Some PACs noted.  Telemetry was also reviewed and he does have irregular rhythm suggesting atrial fibrillation.  No known history of same.  We will get an echocardiogram.  Check thyroid function test.  We will request cardiology to evaluate this patient.  EEG is pending.  PT and OT evaluation although it appears that he is pretty much wheelchair-bound. ?He appears to have right-sided weakness.  Yesterday there was concern for left-sided weakness.  CT head did not show any acute findings.  According to PCP notes he does have residual right-sided weakness from old stroke.  Proceed with MRI Brain. ? ?Abnormal EKG ?History of Mobitz type I is present.  EKG from this morning suggest atrial fibrillation.  We will request cardiology to provide input. ? ?Diabetes mellitus type 2 ?Monitor CBGs.  Continue SSI.  He is noted to be on glargine as well. ? ?Essential hypertension ?Monitor blood pressures closely.  Noted to be on amlodipine and lisinopril prior to admission.  These are currently held. ? ?Chronic kidney disease stage IIIb ?Renal function at baseline.  Continue to monitor. ? ?Hypokalemia and hypomagnesemia ?Will be aggressively repleted. ? ?Normocytic anemia ?No evidence of overt blood loss.  Continue to monitor. ? ?Vascular dementia ?Supposed to be on Namenda prior to admission but is not listed on his home medication list. ? ?Obesity ?Estimated body mass index is 31.46 kg/m? as calculated from the following: ?  Height as of this encounter: 5\' 11"  (1.803 m). ?  Weight as of this encounter: 102.3 kg. ? ?Goals of care ?This is patient's fourth hospitalization in the last 6 months.  Will involve palliative care for goals of care conversation. ? ?DVT Prophylaxis: Lovenox ?Code Status: Full code ?Family Communication: No family at bedside.  His niece apparently is the primary caregiver ?Disposition Plan:  Lives at home.  Niece cares for him.  But he does live by himself ? ?Status is: Observation ?The patient remains OBS appropriate and may d/c before 2 midnights. ? ? ? ? ? ?Medications: Scheduled: ? aspirin  81 mg Oral Q breakfast  ? atorvastatin  10 mg Oral Daily  ? enoxaparin (LOVENOX) injection  50 mg Subcutaneous Q24H  ? insulin aspart  0-9 Units Subcutaneous TID WC  ? insulin glargine-yfgn  10 Units Subcutaneous Daily  ? multivitamins with iron  1 tablet Oral Daily  ? pantoprazole  40 mg Oral Daily  ? tamsulosin  0.4 mg Oral QPC supper  ? thiamine  250 mg Oral Daily  ? ?Continuous: ? sodium chloride 75 mL/hr at 06/09/21 0230  ? magnesium sulfate    ? magnesium sulfate bolus IVPB    ? potassium chloride    ? ?ZJI:[id number] **OR** acetaminophen, ondansetron **OR** ondansetron (ZOFRAN) IV ? ?Antibiotics: ?Anti-infectives (From admission, onward)  ? ? None  ? ?  ? ? ?Objective: ? ?Vital Signs ? ?Vitals:  ? 06/09/21 0118 06/09/21 0138 06/09/21 0258 06/09/21 5277  ?BP: (!) 160/57 (!) 154/71 (!) 153/83 133/74  ?Pulse: 60 69 (!) 55 (!) 102  ?Resp: 20 17 16 16   ?Temp: 97.7 ?F (36.5 ?C)  97.7 ?F (36.5 ?C) 98 ?F (36.7 ?C)  ?TempSrc: Oral  Oral Oral  ?SpO2: 100% 97% 100% 99%  ?Weight:      ?Height:      ? ? ?Intake/Output Summary (Last 24 hours) at 06/09/2021 1017 ?Last data filed at 06/09/2021 0422 ?Gross per 24 hour  ?Intake 136.98 ml  ?Output --  ?Net 136.98 ml  ? ?Filed Weights  ? 06/08/21 1514  ?Weight: 102.3 kg  ? ? ?General appearance: Awake alert.  In no distress ?Resp: Clear to auscultation bilaterally.  Normal effort ?Cardio: S1-S2 is irregularly irregular. ?GI: Abdomen is soft.  Nontender nondistended.  Bowel sounds are present normal.  No masses organomegaly ?Extremities: No edema.  Full range of motion of lower extremities. ?Neurologic: He is alert.  He knew he was in any pain hospital.  He knew the year and the month.  No cranial nerve deficits noted.  Subtle weakness of the right upper extremity  noted. ? ? ?Lab Results: ? ?Data Reviewed: I have personally reviewed following labs and reports of the imaging studies ? ?CBC: ?Recent Labs  ?Lab 06/08/21 ?1613 06/09/21 ?0416  ?WBC 13.1* 12.8*  ?NEUTROABS 9.6*  --   ?HGB 10.6* 10.8*  ?HCT 34.6* 35.9*  ?MCV 89.6 89.3  ?PLT 224 246  ? ? ?Basic Metabolic Panel: ?Recent Labs  ?Lab 06/08/21 ?1613 06/09/21 ?0416  ?NA 140 140  ?K 3.7 3.3*  ?CL 109 110  ?CO2 23 22  ?GLUCOSE 126* 116*  ?BUN 17 17  ?CREATININE 1.34* 1.08  ?CALCIUM 7.9* 8.3*  ?MG  --  1.4*  ? ? ?GFR: ?Estimated Creatinine Clearance: 54.3 mL/min (by C-G formula based on SCr of 1.08 mg/dL). ? ?Liver Function Tests: ?Recent Labs  ?Lab 06/08/21 ?1613  ?AST 12*  ?ALT 6  ?ALKPHOS 117  ?BILITOT 0.6  ?PROT 6.6  ?ALBUMIN 2.6*  ? ? ? ?CBG: ?Recent  Labs  ?Lab 06/09/21 ?0736 06/09/21 ?1014  ?GLUCAP 117* 195*  ? ? ? ?Thyroid Function Tests: ?Recent Labs  ?  06/09/21 ?0416  ?TSH 0.381  ? ? ? ?Radiology Studies: ?CT Head Wo Contrast ? ?Result Date: 06/08/2021 ?CLINICAL DATA:  Dizziness, persistent/recurrent, cardiac or vascular cause suspected EXAM: CT HEAD WITHOUT CONTRAST TECHNIQUE: Contiguous axial images were obtained from the base of the skull through the vertex without intravenous contrast. RADIATION DOSE REDUCTION: This exam was performed according to the departmental dose-optimization program which includes automated exposure control, adjustment of the mA and/or kV according to patient size and/or use of iterative reconstruction technique. COMPARISON:  CT head April 01, 2021. FINDINGS: Brain: No evidence of acute infarction, hemorrhage, hydrocephalus, extra-axial collection or mass lesion/mass effect. Vascular: No hyperdense vessel identified. Calcific intracranial atherosclerosis. Skull: No acute fracture. Sinuses/Orbits: Mild paranasal sinus mucosal thickening. No acute orbital findings. Other: No mastoid effusions. IMPRESSION: No evidence of acute intracranial abnormality. Electronically Signed   By: Margaretha Sheffield M.D.   On: 06/08/2021 17:15  ? ?DG Chest Port 1 View ? ?Result Date: 06/08/2021 ?CLINICAL DATA:  Weakness EXAM: PORTABLE CHEST 1 VIEW COMPARISON:  03/12/2021 FINDINGS: Mild interstitial coarsening, likely chronic in

## 2021-06-09 NOTE — Progress Notes (Signed)
*  PRELIMINARY RESULTS* ?Echocardiogram ?2D Echocardiogram has been performed. ? ?Mike Briggs ?06/09/2021, 5:01 PM ?

## 2021-06-09 NOTE — Evaluation (Addendum)
Occupational Therapy Evaluation ?Patient Details ?Name: Mike Briggs ?MRN: [id number] ?DOB: Jun 27, 1928 ?Today's Date: 06/09/2021 ? ? ?History of Present Illness Mike Briggs is a 88 y.o. male with medical history significant of T2DM, hypertension, dementia, CVA and depression who was referred to the ED by his primary care after having a syncope episode at the outpatient clinic today.   While at the Primary care office went unresponsive he was not responsive to verbal stimuli but was responding to pain with eye movement. He was not able to move his left upper extremity and recovered slowly his consciousness. EMS was called and patient was brought to the ED for CVA evaluation. (per MD)  ? ?Clinical Impression ?  ?Pt agreeable to OT and PT co-evaluation. Pt requires much assist at baseline and reportedly is near baseline levels based on previous assist level needed at home. Pt reports much assist for ADL's and transfers to scooter chair. Pt does not ambulate. Pt is recommended for home health OT services to ensure best set up and safety at home. Pt is not recommended for further acute OT services and will be discharged to care of nursing staff for remaining length of stay.    ?   ? ?Recommendations for follow up therapy are one component of a multi-disciplinary discharge planning process, led by the attending physician.  Recommendations may be updated based on patient status, additional functional criteria and insurance authorization.  ? ?Follow Up Recommendations ? Home health OT  ?  ?Assistance Recommended at Discharge Frequent or constant Supervision/Assistance  ?Patient can return home with the following A lot of help with walking and/or transfers;A lot of help with bathing/dressing/bathroom;Assistance with cooking/housework;Assistance with feeding;Direct supervision/assist for medications management;Assist for transportation;Help with stairs or ramp for entrance ? ?  ?Functional Status Assessment ? Patient  has had a recent decline in their functional status and demonstrates the ability to make significant improvements in function in a reasonable and predictable amount of time.  ?Equipment Recommendations ? None recommended by OT  ?  ?Recommendations for Other Services   ? ? ?  ?Precautions / Restrictions Precautions ?Precautions: Fall ?Restrictions ?Weight Bearing Restrictions: No  ? ?  ? ?Mobility Bed Mobility ?Overal bed mobility: Needs Assistance ?Bed Mobility: Supine to Sit ?  ?  ?Supine to sit: Mod assist, HOB elevated ?  ?  ?General bed mobility comments: labored movement with need for assist to push up from L side. ?  ? ?Transfers ?Overall transfer level: Needs assistance ?  ?Transfers: Sit to/from Stand, Bed to chair/wheelchair/BSC ?Sit to Stand: Max assist ?Stand pivot transfers: Max assist ?  ?  ?  ?  ?General transfer comment: PT assisting pt without use of RW. Max A needed with pivot to chair without steps. ?  ? ?  ?Balance Overall balance assessment: Needs assistance ?Sitting-balance support: Feet supported, Single extremity supported ?Sitting balance-Leahy Scale: Good ?Sitting balance - Comments: seated EOB ?  ?Standing balance support: During functional activity, Bilateral upper extremity supported ?Standing balance-Leahy Scale: Poor ?Standing balance comment: PT assisting anteriorly ?  ?  ?  ?  ?  ?  ?  ?  ?  ?  ?  ?   ? ?ADL either performed or assessed with clinical judgement  ? ?ADL Overall ADL's : Needs assistance/impaired ?Eating/Feeding: Set up;Bed level ?Eating/Feeding Details (indicate cue type and reason): assist to open containers for feeding ?Grooming: Minimal assistance;Sitting ?  ?Upper Body Bathing: Moderate assistance ?  ?Lower Body Bathing: Total assistance ?  ?  ?  ?  Lower Body Dressing: Total assistance;Bed level ?Lower Body Dressing Details (indicate cue type and reason): assist to don socks at bed level ?Toilet Transfer: Maximal assistance;Stand-pivot ?Toilet Transfer Details  (indicate cue type and reason): simulated EOB to chair transfer ?  ?  ?  ?  ?  ?General ADL Comments: Pt needs much assist at basline.  ? ? ? ?Vision Baseline Vision/History: 0 No visual deficits ?Ability to See in Adequate Light: 0 Adequate ?Patient Visual Report: No change from baseline ?Vision Assessment?: No apparent visual deficits ?Additional Comments: per observation during functional tasks.  ?   ?   ?  ?   ?  ? ?Pertinent Vitals/Pain Pain Assessment ?Pain Assessment: Faces ?Faces Pain Scale: Hurts a little bit ?Pain Location: L ankle ?Pain Descriptors / Indicators:  (pt reported pain but showed no signs of pain during mobility.) ?Pain Intervention(s): Limited activity within patient's tolerance, Monitored during session, Repositioned  ? ? ? ?Hand Dominance Right ?  ?Extremity/Trunk Assessment Upper Extremity Assessment ?Upper Extremity Assessment: RUE deficits/detail;LUE deficits/detail ?RUE Deficits / Details: 2-/5 shoulder flexion; 3-/5 elbow flexion; 2-/5 wrist flexion and extension; 3-/5 grip. Deficits from previous stroke. ?RUE Sensation: decreased light touch (numbness and tingling) ?RUE Coordination: decreased fine motor;decreased gross motor ?LUE Deficits / Details: Generally weak. Decreased fine motor coordination. ?LUE Coordination: decreased fine motor ?  ?Lower Extremity Assessment ?Lower Extremity Assessment: Defer to PT evaluation ?  ?Cervical / Trunk Assessment ?Cervical / Trunk Assessment: Normal ?  ?Communication Communication ?Communication: No difficulties ?  ?Cognition Arousal/Alertness: Awake/alert ?Behavior During Therapy: [facility] for tasks assessed/performed ?Overall Cognitive Status: History of cognitive impairments - at baseline ?  ?  ?  ?  ?  ?  ?  ?  ?  ?  ?  ?  ?  ?  ?  ?  ?  ?  ?  ?   ? ?  ?   ?  ?    ? ? ?Home Living Family/patient expects to be discharged to:: Private residence ?Living Arrangements: Alone ?Available Help at Discharge: Personal care attendant;Family;Available  PRN/intermittently ?Type of Home: Apartment ?Home Access: Level entry ?  ?  ?Home Layout: One level ?  ?  ?Bathroom Shower/Tub: Tub/shower unit;Sponge bathes at baseline ?  ?Bathroom Toilet: Handicapped height ?Bathroom Accessibility: Yes ?  ?Home Equipment: Rolling Walker (2 wheels);BSC/3in1;Grab bars - tub/shower;Grab bars - toilet;Electric scooter;Hospital bed;Other (comment) ?  ?Additional Comments: uses overhead trapeze; care givers present 8AM to 7 PM. ?  ? ?  ?Prior Functioning/Environment Prior Level of Function : Needs assist ?  ?  ?  ?Physical Assist : Mobility (physical);ADLs (physical) ?Mobility (physical): Bed mobility;Transfers;Gait;Stairs ?ADLs (physical): Bathing;Dressing;Toileting;Feeding;IADLs;Grooming ?Mobility Comments: non-ambulatory, assisted for scooting over from bed to power scooter, uses overhead trapeze and assistance for bed mobility ?ADLs Comments: assisted by family, home aides 8 am to 7 pm 5 days/week ?  ? ?  ?  ?   ?  ?   ?    ?  ?OT Goals(Current goals can be found in the care plan section) Acute Rehab OT Goals ?Patient Stated Goal: return home ?OT Goal Formulation: With patient ?  ?OT Frequency:   ?  ? ?Co-evaluation PT/OT/SLP Co-Evaluation/Treatment: Yes ?Reason for Co-Treatment: To address functional/ADL transfers ?  ?OT goals addressed during session: ADL's and self-care ?  ? ?  ?AM-PAC OT "6 Clicks" Daily Activity     ?Outcome Measure Help from another person eating meals?: A Little ?Help from another person taking care of  personal grooming?: A Lot ?Help from another person toileting, which includes using toliet, bedpan, or urinal?: Total ?Help from another person bathing (including washing, rinsing, drying)?: A Lot ?Help from another person to put on and taking off regular upper body clothing?: A Lot ?Help from another person to put on and taking off regular lower body clothing?: Total ?6 Click Score: 11 ?  ?End of Session Equipment Utilized During Treatment: Gait  belt ? ?Activity Tolerance: Patient tolerated treatment well ?Patient left: in chair;with call bell/phone within reach;with chair alarm set ? ?OT Visit Diagnosis: Unsteadiness on feet (R26.81);Other abnormalities of gait and mob

## 2021-06-09 NOTE — TOC Progression Note (Signed)
?  Transition of Care (TOC) Screening Note ? ? ?Patient Details  ?Name: Mike Briggs ?Date of Birth: 19-Jul-1928 ? ? ?Transition of Care (TOC) CM/SW Contact:    ?Boneta Lucks, RN ?Phone Number: ?06/09/2021, 2:56 PM ? ? ? ?Transition of Care Department [facility]) has reviewed patient and no TOC needs have been identified at this time. We will continue to monitor patient advancement through interdisciplinary progression rounds. If new patient transition needs arise, please place a TOC consult. ? ? ?  ?Barriers to Discharge: Continued Medical Work up ? ? ?

## 2021-06-09 NOTE — Progress Notes (Signed)
EEG completed, results pending. 

## 2021-06-09 NOTE — Progress Notes (Signed)
Telemetry reported 12 beat ventricular tachycardia, asymptomatic. ?Continue telemetry monitoring.  ?

## 2021-06-09 NOTE — Progress Notes (Addendum)
Initial Nutrition Assessment ? ?DOCUMENTATION CODES:  ? ?Obesity unspecified ? ?INTERVENTION:  ?Ensure MAX daily ? ?Multivitamin  ? ?Recommend consider liberalized diet given patient age ? ?NUTRITION DIAGNOSIS:  ? ?Inadequate oral intake related to acute illness as evidenced by meal completion < 25% of lunch.  ? ? ?GOAL:  ?Patient will meet greater than or equal to 90% of their needs ? ? ?MONITOR:  ?PO intake, Supplement acceptance, Labs, Weight trends, Skin ? ?REASON FOR ASSESSMENT:  ? ?Consult ?Assessment of nutrition requirement/status ? ?ASSESSMENT: Patient is a 88 yo male with hx of dementia, DM2, CKD-3, HTN, Stroke who presents with UTI. Code blue called this morning. Cardiology following. Recurrent syncopal episodes.  ? ?Patient is resting. Talked with nursing bedside - pt out of room much of the day for various tests. ECHO also pending.  ? ?His lunch is here but he has not been able to eat as yet. Unable to obtain detailed nutrition history today. No family bedside.  ? ?Weight is stable between 102-104 kg- x 2+ years. Currently 102.3 kg.  ? ?Medications: novolog, Semglee, MVI, protonix, thiamine, lipitor ? ?IVF- NS@ 75 ml/hr and KCL ? ?  ?Labs reviewed:  ? ?  Latest Ref Rng & Units 06/09/2021  ?  4:16 AM 06/08/2021  ?  4:13 PM 04/04/2021  ?  5:01 AM  ?BMP  ?Glucose 70 - 99 mg/dL 116   126   102    ?BUN 8 - 23 mg/dL 17   17   14     ?Creatinine 0.61 - 1.24 mg/dL 1.08   1.34   1.18    ?Sodium 135 - 145 mmol/L 140   140   138    ?Potassium 3.5 - 5.1 mmol/L 3.3   3.7   3.7    ?Chloride 98 - 111 mmol/L 110   109   108    ?CO2 22 - 32 mmol/L 22   23   23     ?Calcium 8.9 - 10.3 mg/dL 8.3   7.9   8.2    ?  ? ?NUTRITION - FOCUSED PHYSICAL EXAM: ?Deferred to follow up  ? ? ?Diet Order:   ?Diet Order   ? ?       ?  Diet Heart Room service appropriate? Yes; Fluid consistency: Thin  Diet effective now       ?  ? ?  ?  ? ?  ? ? ?EDUCATION NEEDS:  ?Not appropriate for education at this time ? ?Skin:  Skin Assessment: Reviewed  RN Assessment (blister on left great toe per nurisng) ? ?Last BM:  5/12 ? ?Height:  ? ?Ht Readings from Last 1 Encounters:  ?06/08/21 5\' 11"  (1.803 m)  ? ? ?Weight:  ? ?Wt Readings from Last 1 Encounters:  ?06/08/21 102.3 kg  ? ? ?Ideal Body Weight:   78 kg ? ?BMI:  Body mass index is 31.46 kg/m?. ? ?Estimated Nutritional Needs:  ? ?Kcal:  1900-2100 ? ?Protein:  108-117 gr ? ?Fluid:  2 liters daily ? ?Colman Cater MS,RD,CSG,LDN ?Contact: AMION ? ?

## 2021-06-09 NOTE — Progress Notes (Signed)
Patient was transferred from chair to [facility] by PT, patient was given some privacy to use the bathroom. However, PT came and called me out another room to help him with the patient, he said  patient was not responding. Upon assessment  when walking into the room,patient was slumped over on [facility] ,  Immediately PT transferred patient to the bed, I called his name no response, I gave him 3 hard sternum rubs to chest, patient start to open his eyes, code blue had been called. Patient had become unresponsive while trying to have a bowel movement. . Vital signs documented on flow sheet start time 10:15 AM. Md placed orders, received and carried out orders. Will continue to monitor patient.Marland Kitchen ?

## 2021-06-09 NOTE — Procedures (Signed)
Patient Name: Mike Briggs  ?MRN: [id number]  ?Epilepsy Attending: Lora Havens  ?Referring Physician/Provider: Tawni Millers, MD ?Date: 06/09/2021 ?Duration: 22.56 mins ? ?Patient history: 88yo M with syncope. EEG to evaluate for seizure ? ?Level of alertness: Awake, asleep ? ?AEDs during EEG study: None ? ?Technical aspects: This EEG study was done with scalp electrodes positioned according to the 10-20 International system of electrode placement. Electrical activity was acquired at a sampling rate of 500Hz  and reviewed with a high frequency filter of 70Hz  and a low frequency filter of 1Hz . EEG data were recorded continuously and digitally stored.  ? ?Description: The posterior dominant rhythm consists of 8 Hz activity of moderate voltage (25-35 uV) seen predominantly in posterior head regions, symmetric and reactive to eye opening and eye closing. Sleep was characterized by vertex waves, sleep spindles (12 to 14 Hz), maximal frontocentral region. Hyperventilation and photic stimulation were not performed.    ? ?IMPRESSION: ?This study is within normal limits. No seizures or epileptiform discharges were seen throughout the recording. ? ?Lora Havens  ? ?

## 2021-06-09 NOTE — Progress Notes (Signed)
This nurse notified Dr. Cathlean Sauer through secure chat that patient HR irregular per telemetry and no documentation of this per his medical history. Patient is alert and oriented and states he has never been told that his HR is irregular. EKG obtained and placed in his chart for review.  ?

## 2021-06-10 DIAGNOSIS — N1831 Chronic kidney disease, stage 3a: Secondary | ICD-10-CM

## 2021-06-10 DIAGNOSIS — E1122 Type 2 diabetes mellitus with diabetic chronic kidney disease: Secondary | ICD-10-CM | POA: Diagnosis not present

## 2021-06-10 DIAGNOSIS — I10 Essential (primary) hypertension: Secondary | ICD-10-CM | POA: Diagnosis not present

## 2021-06-10 DIAGNOSIS — R55 Syncope and collapse: Secondary | ICD-10-CM | POA: Diagnosis not present

## 2021-06-10 LAB — BASIC METABOLIC PANEL
Anion gap: 5 (ref 5–15)
BUN: 17 mg/dL (ref 8–23)
CO2: 20 mmol/L — ABNORMAL LOW (ref 22–32)
Calcium: 7.9 mg/dL — ABNORMAL LOW (ref 8.9–10.3)
Chloride: 112 mmol/L — ABNORMAL HIGH (ref 98–111)
Creatinine, Ser: 0.9 mg/dL (ref 0.61–1.24)
GFR, Estimated: >60 mL/min (ref >60)
Glucose, Bld: 72 mg/dL (ref 70–99)
Potassium: 4.8 mmol/L (ref 3.5–5.1)
Sodium: 137 mmol/L (ref 135–145)

## 2021-06-10 LAB — CBC
HCT: 29.9 % — ABNORMAL LOW (ref 39.0–52.0)
Hemoglobin: 9.6 g/dL — ABNORMAL LOW (ref 13.0–17.0)
MCH: 27.9 pg (ref 26.0–34.0)
MCHC: 32.1 g/dL (ref 30.0–36.0)
MCV: 86.9 fL (ref 80.0–100.0)
Platelets: ADEQUATE 10*3/uL (ref 150–400)
RBC: 3.44 MIL/uL — ABNORMAL LOW (ref 4.22–5.81)
RDW: 17.3 % — ABNORMAL HIGH (ref 11.5–15.5)
WBC: 11.8 10*3/uL — ABNORMAL HIGH (ref 4.0–10.5)
nRBC: 0.2 % (ref 0.0–0.2)

## 2021-06-10 LAB — ECHOCARDIOGRAM COMPLETE
AR max vel: 1.58 cm2
AV Area VTI: 1.55 cm2
AV Area mean vel: 1.36 cm2
AV Mean grad: 6 mmHg
AV Peak grad: 10.6 mmHg
Ao pk vel: 1.63 m/s
Area-P 1/2: 2.87 cm2
Height: 71 in
S' Lateral: 3.5 cm
Weight: 3608.49 oz

## 2021-06-10 LAB — GLUCOSE, CAPILLARY
Glucose-Capillary: 66 mg/dL — ABNORMAL LOW (ref 70–99)
Glucose-Capillary: 88 mg/dL (ref 70–99)
Glucose-Capillary: 172 mg/dL — ABNORMAL HIGH (ref 70–99)
Glucose-Capillary: 131 mg/dL — ABNORMAL HIGH (ref 70–99)
Glucose-Capillary: 97 mg/dL (ref 70–99)

## 2021-06-10 LAB — MAGNESIUM: Magnesium: 2.1 mg/dL (ref 1.7–2.4)

## 2021-06-10 NOTE — Progress Notes (Signed)
Niece at bedside and after discussing potential SNF placement, patient agreeable. Stanton Kidney states her daughter, [name], is the POA and will bring copies when she returns home on Monday. Dr. Dyann Kief aware and [facility] consult placed.  ?

## 2021-06-10 NOTE — Progress Notes (Signed)
Tele called pt 8 run v tach. Pt easy to arose lying in bed with eyes closed. No complaints or s/s of pain of discomfort. Will continue to monitor  ?

## 2021-06-10 NOTE — Progress Notes (Signed)
After assuming care of this patient approximately 1530 and receiving report, this nurse called and spoke with CCMD regarding patient's heart rhythm. Minki from [place] suggested obtaining EKG due to patient being in heart block but unable to decipher which block. EKG obtained and Dr. Dyann Kief made aware and reviewed EKG. Advised this nurse to continue current plan of care.   ?

## 2021-06-10 NOTE — Progress Notes (Addendum)
? ?[facility] ?PROGRESS NOTE ? ? ?Mike Briggs HKV:[id number] DOB: 1928/07/26 DOA: 06/08/2021 ? ?PCP: [name], MD ? ?Brief History/Interval Summary: 88 y.o. male with medical history significant of T2DM, hypertension, dementia, CVA and depression who was referred to the ED by his primary care after having a syncope episode at the outpatient clinic. While at the Primary care office went unresponsive he was not responsive to verbal stimuli but was responding to pain with eye movement. He was not able to move his left upper extremity and recovered slowly his consciousness. EMS was called and patient was brought to the ED for CVA evaluation.  He had no recollection of these events.  CT head did not show any acute findings.  He was hospitalized back in March for urinary tract infection.  He was hospitalized in February for acute encephalopathy thought to be secondary to UTI.  Hospitalized back in December for atypical chest pain and dysphagia.  Found to have Mobitz type I AV block at that time. ? ?Consultants: Cardiology ? ?Procedures: See below for x-ray reports.  2D echo. ? ? ? ?Subjective/Interval History: ?Weak, deconditioned, chronically ill in appearance.  Denies chest pain, no nausea, no vomiting. ? ?Assessment/Plan: ?Patient with episode of vasovagal while using commode on 06/09/2021; CODE BLUE was activated.  No resuscitation required; patient converted back into sinus rhythm on his own.  2D echo reassuring and CT angiogram of the chest given elevated D-dimer demonstrated no pulmonary embolism. ? ?Syncope ?-Etiology is unclear.  Apparently he was in his primary care physician's office when he suddenly passed out.  Arrhythmia is on the top of the differential.  Continue with telemetry.   ?-Appreciate cardiology assistance and recommendation. ?-TSH within normal limits. ?-He appears to have right-sided weakness. CT head did not show any acute findings.  According to PCP notes he does have residual  right-sided weakness from old stroke.   ?-MRI without acute abnormality ?-Electroencephalogram demonstrated no epilepsy ?-PT initially has recommended patient to go back home with home health services; patient is maximal assistance just to transfer out of bed and essentially lives alone making him very unsafe due to poor balance and deconditioning. ?-Family has discussed with patient and he is in agreement to pursuit skilled nursing facility placement. ?-TOC has been made aware. ? ?Abnormal EKG ?-History of Mobitz type I is present.   ?-There have been no sustained type II heart block appreciated along with PACs.  No atrial fibrillation. ?-Case discussed with cardiology service who has recommended the patient is not a candidate for pacemaker or anticoagulation ?-Given nonsustained episodes no need for rate control agents. ?-2D echo reassuring. ? ?Diabetes mellitus type 2 ?-Monitor CBGs.   ?-Continue SSI.  He is noted to be on glargine as well. ? ?Essential hypertension ?-Monitor blood pressures closely.  Noted to be on amlodipine and lisinopril prior to admission.   ?-These are currently held given concern for orthostatic component.. ?-Follow vital signs. ? ?Chronic kidney disease stage IIIa ?-Renal function at baseline.   ?-Continue to monitor renal function trend.. ? ?Hypokalemia and hypomagnesemia ?-Repleted ?-Goal is to keep magnesium above 2 and potassium above 4. ?-Continue to follow electrolytes trend and stability ? ?Normocytic anemia ?No evidence of overt bleeding. ?-Continue to follow hemoglobin trend. ? ?Vascular dementia ?-Supposed to be on Namenda prior to admission but is not listed on his home medication list. ?-Continue supportive care and constant reorientation. ? ?Obesity ?Estimated body mass index is 31.46 kg/m? as calculated from the following: ?  Height as of this encounter: 5\' 11"  (1.803 m). ?  Weight as of this encounter: 102.3 kg. ? ?Goals of care ?-This is patient's fourth hospitalization in  the last 6 months. -Palliative care consulted ?-Follow recommendations of goals of care discussion/advance care planning ? ?DVT Prophylaxis: Lovenox ?Code Status: Full code ?Family Communication: No family at bedside.  His niece apparently is the primary caregiver ?Disposition Plan: Lives at home, during the day with aide assistance and alone throughout the night.  Patient currently maximal assistance just to get out of bed, and demonstrating poor balance.  Niece cares for him.  But he does live by himself; afraid for unsafe discharge and asking for nursing home placement. ? ?Status is: Observation ?The patient remains OBS appropriate and may d/c before 2 midnights. ? ? ?Medications: Scheduled: ? aspirin  81 mg Oral Q breakfast  ? atorvastatin  10 mg Oral Daily  ? enoxaparin (LOVENOX) injection  50 mg Subcutaneous Q24H  ? insulin aspart  0-9 Units Subcutaneous TID WC  ? insulin glargine-yfgn  10 Units Subcutaneous Daily  ? multivitamins with iron  1 tablet Oral Daily  ? pantoprazole  40 mg Oral Daily  ? polyethylene glycol  17 g Oral Daily  ? Ensure Max Protein  11 oz Oral Daily  ? senna-docusate  2 tablet Oral QHS  ? tamsulosin  0.4 mg Oral QPC supper  ? thiamine  250 mg Oral Daily  ? ?Continuous: ? sodium chloride 50 mL/hr at 06/10/21 1740  ? ?AST:[id number] **OR** acetaminophen, ondansetron **OR** ondansetron (ZOFRAN) IV ? ?Antibiotics: ?Anti-infectives (From admission, onward)  ? ? None  ? ?  ? ? ?Objective: ? ?Vital Signs ? ?Vitals:  ? 06/09/21 1813 06/09/21 2159 06/10/21 0534 06/10/21 1332  ?BP: (!) 157/57 (!) 122/46 (!) 142/75 119/69  ?Pulse: 86 (!) 42 78 (!) 107  ?Resp: 16   14  ?Temp: (!) 97.5 ?F (36.4 ?C) 98.6 ?F (37 ?C) (!) 97.5 ?F (36.4 ?C) (!) 97.5 ?F (36.4 ?C)  ?TempSrc: Axillary  Oral Axillary  ?SpO2: 100% 100% 98% 97%  ?Weight:      ?Height:      ? ? ?Intake/Output Summary (Last 24 hours) at 06/10/2021 1741 ?Last data filed at 06/10/2021 1300 ?Gross per 24 hour  ?Intake 1270 ml  ?Output 1250 ml   ?Net 20 ml  ? ?Filed Weights  ? 06/08/21 1514  ?Weight: 102.3 kg  ? ?General exam: Alert, awake, oriented x 2; disoriented to time.  Severely weak and deconditioned.  Requiring maximal assistance just to get out of bed to use commode and check orthostatic vital signs. ?Respiratory system: Clear to auscultation. Respiratory effort normal.  No using accessory muscles.  1 L nasal cannula supplementation in place. ?Cardiovascular system: Rate controlled, no rubs, no gallops, no JVD on exam. ?Gastrointestinal system: Abdomen is nondistended, soft and nontender. No organomegaly or masses felt. Normal bowel sounds heard. ?Central nervous system: Alert and oriented. No focal neurological deficits. ?Extremities: No cyanosis or clubbing; no edema. ?Skin: No petechiae. ?Psychiatry: Judgement and insight appear normal. Mood & affect appropriate.  ? ?Lab Results: ? ?Data Reviewed: I have personally reviewed following labs and reports of the imaging studies ? ?CBC: ?Recent Labs  ?Lab 06/08/21 ?1613 06/09/21 ?0416 06/10/21 ?0631  ?WBC 13.1* 12.8* 11.8*  ?NEUTROABS 9.6*  --   --   ?HGB 10.6* 10.8* 9.6*  ?HCT 34.6* 35.9* 29.9*  ?MCV 89.6 89.3 86.9  ?PLT 224 246 PLATELET CLUMPS NOTED ON SMEAR, COUNT APPEARS ADEQUATE  ? ? ?  Basic Metabolic Panel: ?Recent Labs  ?Lab 06/08/21 ?1613 06/09/21 ?0416 06/10/21 ?0631  ?NA 140 140 137  ?K 3.7 3.3* 4.8  ?CL 109 110 112*  ?CO2 23 22 20*  ?GLUCOSE 126* 116* 72  ?BUN 17 17 17   ?CREATININE 1.34* 1.08 0.90  ?CALCIUM 7.9* 8.3* 7.9*  ?MG  --  1.4* 2.1  ? ? ?GFR: ?Estimated Creatinine Clearance: 65.1 mL/min (by C-G formula based on SCr of 0.9 mg/dL). ? ?Liver Function Tests: ?Recent Labs  ?Lab 06/08/21 ?1613  ?AST 12*  ?ALT 6  ?ALKPHOS 117  ?BILITOT 0.6  ?PROT 6.6  ?ALBUMIN 2.6*  ? ? ? ?CBG: ?Recent Labs  ?Lab 06/09/21 ?2010 06/10/21 ?0732 06/10/21 ?0825 06/10/21 ?1127 06/10/21 ?1648  ?GLUCAP 141* 66* 88 172* 131*  ? ? ? ?Thyroid Function Tests: ?Recent Labs  ?  06/09/21 ?0416  ?TSH 0.381  ?FREET4  1.48*  ? ? ? ?Radiology Studies: ?CT Angio Chest Pulmonary Embolism (PE) W or WO Contrast ? ?Result Date: 06/09/2021 ?CLINICAL DATA:  Bradycardia.  Unresponsive earlier today. EXAM: CT ANGIOGRAPHY CHEST WITH

## 2021-06-11 DIAGNOSIS — Z794 Long term (current) use of insulin: Secondary | ICD-10-CM | POA: Diagnosis not present

## 2021-06-11 DIAGNOSIS — I472 Ventricular tachycardia, unspecified: Secondary | ICD-10-CM | POA: Diagnosis not present

## 2021-06-11 DIAGNOSIS — I129 Hypertensive chronic kidney disease with stage 1 through stage 4 chronic kidney disease, or unspecified chronic kidney disease: Secondary | ICD-10-CM | POA: Diagnosis present

## 2021-06-11 DIAGNOSIS — D649 Anemia, unspecified: Secondary | ICD-10-CM | POA: Diagnosis present

## 2021-06-11 DIAGNOSIS — E785 Hyperlipidemia, unspecified: Secondary | ICD-10-CM | POA: Diagnosis present

## 2021-06-11 DIAGNOSIS — Z79899 Other long term (current) drug therapy: Secondary | ICD-10-CM | POA: Diagnosis not present

## 2021-06-11 DIAGNOSIS — F0150 Vascular dementia without behavioral disturbance: Secondary | ICD-10-CM | POA: Diagnosis present

## 2021-06-11 DIAGNOSIS — Z6831 Body mass index (BMI) 31.0-31.9, adult: Secondary | ICD-10-CM | POA: Diagnosis not present

## 2021-06-11 DIAGNOSIS — Z87891 Personal history of nicotine dependence: Secondary | ICD-10-CM | POA: Diagnosis not present

## 2021-06-11 DIAGNOSIS — N1832 Chronic kidney disease, stage 3b: Secondary | ICD-10-CM | POA: Diagnosis present

## 2021-06-11 DIAGNOSIS — I441 Atrioventricular block, second degree: Secondary | ICD-10-CM | POA: Diagnosis present

## 2021-06-11 DIAGNOSIS — R55 Syncope and collapse: Principal | ICD-10-CM | POA: Diagnosis present

## 2021-06-11 DIAGNOSIS — Z8616 Personal history of COVID-19: Secondary | ICD-10-CM | POA: Diagnosis not present

## 2021-06-11 DIAGNOSIS — I69351 Hemiplegia and hemiparesis following cerebral infarction affecting right dominant side: Secondary | ICD-10-CM | POA: Diagnosis not present

## 2021-06-11 DIAGNOSIS — F32A Depression, unspecified: Secondary | ICD-10-CM | POA: Diagnosis present

## 2021-06-11 DIAGNOSIS — E1122 Type 2 diabetes mellitus with diabetic chronic kidney disease: Secondary | ICD-10-CM | POA: Diagnosis present

## 2021-06-11 DIAGNOSIS — Z515 Encounter for palliative care: Secondary | ICD-10-CM | POA: Diagnosis not present

## 2021-06-11 DIAGNOSIS — Z7982 Long term (current) use of aspirin: Secondary | ICD-10-CM | POA: Diagnosis not present

## 2021-06-11 DIAGNOSIS — N1831 Chronic kidney disease, stage 3a: Secondary | ICD-10-CM | POA: Diagnosis not present

## 2021-06-11 DIAGNOSIS — Z993 Dependence on wheelchair: Secondary | ICD-10-CM | POA: Diagnosis not present

## 2021-06-11 DIAGNOSIS — E669 Obesity, unspecified: Secondary | ICD-10-CM | POA: Diagnosis present

## 2021-06-11 DIAGNOSIS — E876 Hypokalemia: Secondary | ICD-10-CM | POA: Diagnosis present

## 2021-06-11 DIAGNOSIS — Z7189 Other specified counseling: Secondary | ICD-10-CM | POA: Diagnosis not present

## 2021-06-11 DIAGNOSIS — I10 Essential (primary) hypertension: Secondary | ICD-10-CM | POA: Diagnosis not present

## 2021-06-11 DIAGNOSIS — I440 Atrioventricular block, first degree: Secondary | ICD-10-CM | POA: Diagnosis present

## 2021-06-11 DIAGNOSIS — I499 Cardiac arrhythmia, unspecified: Secondary | ICD-10-CM | POA: Diagnosis present

## 2021-06-11 LAB — CBC
HCT: 28.8 % — ABNORMAL LOW (ref 39.0–52.0)
Hemoglobin: 9 g/dL — ABNORMAL LOW (ref 13.0–17.0)
MCH: 27 pg (ref 26.0–34.0)
MCHC: 31.3 g/dL (ref 30.0–36.0)
MCV: 86.5 fL (ref 80.0–100.0)
Platelets: UNDETERMINED 10*3/uL (ref 150–400)
RBC: 3.33 MIL/uL — ABNORMAL LOW (ref 4.22–5.81)
RDW: 16.9 % — ABNORMAL HIGH (ref 11.5–15.5)
WBC: 10.9 10*3/uL — ABNORMAL HIGH (ref 4.0–10.5)
nRBC: 0 % (ref 0.0–0.2)

## 2021-06-11 LAB — BASIC METABOLIC PANEL
Anion gap: 3 — ABNORMAL LOW (ref 5–15)
BUN: 12 mg/dL (ref 8–23)
CO2: 23 mmol/L (ref 22–32)
Calcium: 7.6 mg/dL — ABNORMAL LOW (ref 8.9–10.3)
Chloride: 110 mmol/L (ref 98–111)
Creatinine, Ser: 0.88 mg/dL (ref 0.61–1.24)
GFR, Estimated: >60 mL/min (ref >60)
Glucose, Bld: 93 mg/dL (ref 70–99)
Potassium: 3.9 mmol/L (ref 3.5–5.1)
Sodium: 136 mmol/L (ref 135–145)

## 2021-06-11 LAB — GLUCOSE, CAPILLARY
Glucose-Capillary: 82 mg/dL (ref 70–99)
Glucose-Capillary: 138 mg/dL — ABNORMAL HIGH (ref 70–99)
Glucose-Capillary: 102 mg/dL — ABNORMAL HIGH (ref 70–99)
Glucose-Capillary: 176 mg/dL — ABNORMAL HIGH (ref 70–99)

## 2021-06-11 NOTE — Plan of Care (Signed)
  Problem: Education: Goal: Knowledge of General Education information will improve Description Including pain rating scale, medication(s)/side effects and non-pharmacologic comfort measures Outcome: Progressing   Problem: Health Behavior/Discharge Planning: Goal: Ability to manage health-related needs will improve Outcome: Progressing   

## 2021-06-11 NOTE — Progress Notes (Signed)
? ?[facility] ?PROGRESS NOTE ? ? ?Mike Briggs:[id number] DOB: 01-13-1929 DOA: 06/08/2021 ? ?PCP: [name], MD ? ?Brief History/Interval Summary: 88 y.o. male with medical history significant of T2DM, hypertension, dementia, CVA and depression who was referred to the ED by his primary care after having a syncope episode at the outpatient clinic. While at the Primary care office went unresponsive he was not responsive to verbal stimuli but was responding to pain with eye movement. He was not able to move his left upper extremity and recovered slowly his consciousness. EMS was called and patient was brought to the ED for CVA evaluation.  He had no recollection of these events.  CT head did not show any acute findings.  He was hospitalized back in March for urinary tract infection.  He was hospitalized in February for acute encephalopathy thought to be secondary to UTI.  Hospitalized back in December for atypical chest pain and dysphagia.  Found to have Mobitz type I AV block at that time. ? ?Consultants: Cardiology ? ?Procedures: See below for x-ray reports.  2D echo. ? ? ?Subjective/Interval History: ?Weak, deconditioned, no chest pain, no nausea, no vomiting, good saturation on room air.  Oriented x2 and following commands appropriately. ? ?Assessment/Plan: ?Patient with episode of vasovagal while using commode on 06/09/2021; CODE BLUE was activated.  No resuscitation required; patient converted back into sinus rhythm on his own.  2D echo reassuring and CT angiogram of the chest given elevated D-dimer demonstrated no pulmonary embolism. ? ?Patient remains hemodynamically stable and just high risk for falling and with ongoing deconditioning.  Unsafe discharge place that he can receive 24 hours assistance in care. ? ?Syncope ?-Etiology is unclear.  Apparently he was in his primary care physician's office when he suddenly passed out.  Arrhythmia is on the top of the differential.  Continue with  telemetry.   ?-Appreciate cardiology assistance and recommendation. ?-TSH within normal limits. ?-He appears to have right-sided weakness. CT head did not show any acute findings.  According to PCP notes he does have residual right-sided weakness from old stroke.   ?-MRI without acute abnormality ?-Electroencephalogram demonstrated no epilepsy ?-PT initially has recommended patient to go back home with home health services; patient is maximal assistance just to transfer out of bed and essentially lives alone making him very unsafe due to poor balance and deconditioning. ?-Family has discussed with patient and he is in agreement to pursuit skilled nursing facility placement. ?-TOC has been made aware. ?-Physical therapy has once again attempt to work with patient and recommended home health PT as long as the patient has frequent/constant supervision and assistance (which is not able to be provided at this point).  After discussing with family on 06/10/2021 decision has been made to proceed to skilled nursing facility placement for rehabilitation and conditioning while providing further care. ?-High risk for falling. ? ?Abnormal EKG ?-History of Mobitz type I is present.   ?-There have been no sustained type II heart block appreciated along with PACs.  No atrial fibrillation. ?-Case discussed with cardiology service who has recommended the patient is not a candidate for pacemaker or anticoagulation ?-Given nonsustained episodes appreciated currently; there is no need for rate control agents. ?-2D echo reassuring. ? ?Diabetes mellitus type 2 ?-Monitor CBGs.   ?-Continue SSI.  He is noted to be on glargine as well. ? ?Essential hypertension ?-Monitor blood pressures closely.  Noted to be on amlodipine and lisinopril prior to admission.   ?-These are currently  held given concern for orthostatic component.. ?-Continue to follow vital signs. ? ?Chronic kidney disease stage IIIa ?-Renal function appears to be at baseline  currently.   ?-Continue to monitor renal function trend.. ? ?Hypokalemia and hypomagnesemia ?-Repleted ?-Goal is to keep magnesium above 2 and potassium above 4. ?-Continue to follow electrolytes trend and stability. ?-Maintain adequate hydration. ? ?Normocytic anemia ?-No evidence of overt bleeding. ?-Continue to follow hemoglobin trend. ? ?Vascular dementia ?-Supposed to be on Namenda prior to admission but is not listed on his home medication list. ?-Continue supportive care and constant reorientation. ?-Mentation at baseline currently. ? ?Obesity ?Estimated body mass index is 31.46 kg/m? as calculated from the following: ?  Height as of this encounter: 5\' 11"  (1.803 m). ?  Weight as of this encounter: 102.3 kg. ? ?Goals of care ?-This is patient's fourth hospitalization in the last 6 months. -Palliative care consulted ?-Follow recommendations of goals of care discussion/advance care planning. ?-Patient remains full code. ? ?DVT Prophylaxis: Lovenox ?Code Status: Full code ?Family Communication: No family at bedside.  His niece apparently is the primary caregiver ?Disposition Plan: Lives at home, during the day with aide assistance and alone throughout the night.  Patient currently maximal assistance just to get out of bed, and demonstrating poor balance.  Niece cares for him.  But he does live by himself; afraid for unsafe discharge and asking for nursing home placement. ? ?Status is: Observation ?The patient remains OBS appropriate and may d/c before 2 midnights. ? ? ?Medications: Scheduled: ? aspirin  81 mg Oral Q breakfast  ? atorvastatin  10 mg Oral Daily  ? enoxaparin (LOVENOX) injection  50 mg Subcutaneous Q24H  ? insulin aspart  0-9 Units Subcutaneous TID WC  ? insulin glargine-yfgn  10 Units Subcutaneous Daily  ? multivitamins with iron  1 tablet Oral Daily  ? pantoprazole  40 mg Oral Daily  ? polyethylene glycol  17 g Oral Daily  ? Ensure Max Protein  11 oz Oral Daily  ? senna-docusate  2 tablet Oral  QHS  ? tamsulosin  0.4 mg Oral QPC supper  ? thiamine  250 mg Oral Daily  ? ?Continuous: ? sodium chloride 50 mL/hr at 06/11/21 1409  ? ?BTD:[id number] **OR** acetaminophen, ondansetron **OR** ondansetron (ZOFRAN) IV ? ?Antibiotics: ?Anti-infectives (From admission, onward)  ? ? None  ? ?  ? ? ?Objective: ? ?Vital Signs ? ?Vitals:  ? 06/10/21 1826 06/10/21 2130 06/11/21 0454 06/11/21 1250  ?BP: 136/71 (!) 120/51 133/87 (!) 141/74  ?Pulse: 83 64 96 95  ?Resp:  17 18 20   ?Temp:  98.9 ?F (37.2 ?C) 98.1 ?F (36.7 ?C) 98.4 ?F (36.9 ?C)  ?TempSrc:   Oral Oral  ?SpO2: 100% 100% 98% 100%  ?Weight:      ?Height:      ? ? ?Intake/Output Summary (Last 24 hours) at 06/11/2021 1434 ?Last data filed at 06/11/2021 1304 ?Gross per 24 hour  ?Intake 1877.47 ml  ?Output 850 ml  ?Net 1027.47 ml  ? ?Filed Weights  ? 06/08/21 1514  ?Weight: 102.3 kg  ?General exam: Alert, awake, oriented x 2; no chest pain, no nausea, no vomiting.  Reports feeling weak.  Patient is afebrile. ?Respiratory system: Clear to auscultation. Respiratory effort normal.  Good saturation on room air. ?Cardiovascular system: Rate controlled, no rubs, no gallop.  Gallops.  No JVD. ?Gastrointestinal system: Abdomen is nondistended, soft and nontender. No organomegaly or masses felt. Normal bowel sounds heard. ?Central nervous system: Alert and oriented. No  focal neurological deficits. ?Extremities: No cyanosis or clubbing.  Trace pedal edema appreciated bilaterally. ?Skin: No petechiae. ?Psychiatry: Judgement and insight appear normal. Mood & affect appropriate.  ? ? ?Lab Results: ? ?Data Reviewed: I have personally reviewed following labs and reports of the imaging studies ? ?CBC: ?Recent Labs  ?Lab 06/08/21 ?1613 06/09/21 ?0416 06/10/21 ?0631 06/11/21 ?0601  ?WBC 13.1* 12.8* 11.8* 10.9*  ?NEUTROABS 9.6*  --   --   --   ?HGB 10.6* 10.8* 9.6* 9.0*  ?HCT 34.6* 35.9* 29.9* 28.8*  ?MCV 89.6 89.3 86.9 86.5  ?PLT 224 246 PLATELET CLUMPS NOTED ON SMEAR, COUNT APPEARS  ADEQUATE PLATELET CLUMPS NOTED ON SMEAR, UNABLE TO ESTIMATE  ? ? ?Basic Metabolic Panel: ?Recent Labs  ?Lab 06/08/21 ?1613 06/09/21 ?0416 06/10/21 ?0631 06/11/21 ?1898  ?NA 140 140 137 136  ?K 3.7 3.3* 4.8 3.9  ?CL

## 2021-06-11 NOTE — Progress Notes (Signed)
Physical Therapy Treatment ?Patient Details ?Name: Mike Briggs ?MRN: [id number] ?DOB: 05-01-1928 ?Today's Date: 06/11/2021 ? ? ?History of Present Illness PT has RT side weakness from previous CVA.  Per pt he has two aides and a niece who lives with him.  He has not walked in years.  He gets assistance to his electric scooter and moves himself from there.  He has had  recent vasovagal episodes ? ?  ?PT Comments  ? ? Pt evaluated on 5/12:  recommendation was Home health.  Therapist feels pt could benefit from [facility] or SNF depending on MD recommendation.  Pt feels he will be safe at home with his aide but would not fully engage with therapy session with therapist as he was fearful of falling.  Pt has not been ambulatory for years, but does assist in transferring to scooter and commode. Pt would not assist therapist in this activity, although he stated that he was trying there was no noted LE mm contraction when therapist asked pt to try to stand.    ?Recommendations for follow up therapy are one component of a multi-disciplinary discharge planning process, led by the attending physician.  Recommendations may be updated based on patient status, additional functional criteria and insurance authorization. ? ?Follow Up Recommendations ? Home health PT ?  ?  ?Assistance Recommended at Discharge Frequent or constant Supervision/Assistance  ?Patient can return home with the following A lot of help with walking and/or transfers;A lot of help with bathing/dressing/bathroom;Assistance with cooking/housework;Help with stairs or ramp for entrance;Assistance with feeding;Assist for transportation ?  ?Equipment Recommendations ?    ?  ?Recommendations for Other Services   ? ? ?  ?Precautions / Restrictions Precautions ?Precautions: Fall  ?  ? ?Mobility ? Bed Mobility ?Overal bed mobility: Needs Assistance ?Bed Mobility: Supine to Sit ?  ?  ?Supine to sit: Mod assist, HOB elevated ?  ?  ?General bed mobility comments: labored  movement with need for assist to push up from L side. ?  ? ?Transfers ?Overall transfer level: Needs assistance ?  ?  ?  ?  ?  ?  ?  ?  ?General transfer comment: PT continues to state that he needs a "man"  therapist notes that pt does not even attempt to push with his legs when therapist attempts to transfer him states again that the therapist needs to get a Man in to do the job so he does not fall.  Pt states that he will have two aides at  home and he wll be fine, however, therapist feels that he nees short term rehab to decrease the burden of care on his aide as his second aide is his niece. ?  ?  ?   ?Cognition Arousal/Alertness: Awake/alert ?Behavior During Therapy: [facility] for tasks assessed/performed ?Overall Cognitive Status: History of cognitive impairments - at baseline ?  ?  ?  ?  ?  ?  ?  ?  ?  ?  ?  ?  ?  ?  ?  ?  ?  ?  ?  ? ?  ?   ?   ? ?Pertinent Vitals/Pain Pain Assessment ?Pain Assessment: No/denies pain  ? ? ?Home Living Family/patient expects to be discharged to:: Private residence ?Living Arrangements: Other relatives ?Available Help at Discharge: Personal care attendant;Family;Available PRN/intermittently ?Type of Home: Apartment ?Home Access: Level entry ?  ?  ?  ?Home Layout: One level ?Home Equipment: Rolling Walker (2 wheels);BSC/3in1;Grab bars - tub/shower;Grab bars -  toilet;Electric scooter;Hospital bed;Other (comment) ?Additional Comments: uses overhead trapeze; care givers present 8AM to 7 PM.  ?  ?Prior Function    ?  ?  ?   ? ?PT Goals (current goals can now be found in the care plan section) Acute Rehab PT Goals ?Patient Stated Goal: return home with caregivers to assist ?PT Goal Formulation: With patient ?Time For Goal Achievement: 06/09/21 ?Potential to Achieve Goals: Fair ? ?  ?Frequency ? ? ? Min 4X/week ? ? ? ?  ?PT Plan  Continue with mobility training   ? ? ?   ?AM-PAC PT "6 Clicks" Mobility   ?Outcome Measure ? Help needed turning from your back to your side while in a flat bed  without using bedrails?: Total ?Help needed moving from lying on your back to sitting on the side of a flat bed without using bedrails?: Total ?Help needed moving to and from a bed to a chair (including a wheelchair)?: Total ?Help needed standing up from a chair using your arms (e.g., wheelchair or bedside chair)?: Total ?Help needed to walk in hospital room?: Total ?Help needed climbing 3-5 steps with a railing? : Total ?6 Click Score: 6 ? ?  ?End of Session Equipment Utilized During Treatment: Gait belt ?Activity Tolerance: Other (comment) (Pt willingness to attempt to stand) ?Patient left: in bed ?  ?PT Visit Diagnosis: Unsteadiness on feet (R26.81);Muscle weakness (generalized) (M62.81) ?  ? ? ?Time: 1000-1030 ?PT Time Calculation (min) (ACUTE ONLY): 30 min ? ?Charges:  $Therapeutic Activity: 23-37 mins          ?          ? ? ?Rayetta Humphrey, PT CLT ?[phone]  ?06/11/2021, 11:21 AM ? ?

## 2021-06-12 ENCOUNTER — Encounter (HOSPITAL_COMMUNITY): Payer: Self-pay | Admitting: Internal Medicine

## 2021-06-12 DIAGNOSIS — Z515 Encounter for palliative care: Secondary | ICD-10-CM | POA: Diagnosis not present

## 2021-06-12 DIAGNOSIS — I441 Atrioventricular block, second degree: Secondary | ICD-10-CM

## 2021-06-12 DIAGNOSIS — Z7189 Other specified counseling: Secondary | ICD-10-CM

## 2021-06-12 DIAGNOSIS — N1831 Chronic kidney disease, stage 3a: Secondary | ICD-10-CM | POA: Diagnosis not present

## 2021-06-12 DIAGNOSIS — E1122 Type 2 diabetes mellitus with diabetic chronic kidney disease: Secondary | ICD-10-CM | POA: Diagnosis not present

## 2021-06-12 DIAGNOSIS — I10 Essential (primary) hypertension: Secondary | ICD-10-CM | POA: Diagnosis not present

## 2021-06-12 DIAGNOSIS — R55 Syncope and collapse: Secondary | ICD-10-CM | POA: Diagnosis not present

## 2021-06-12 LAB — BASIC METABOLIC PANEL
Anion gap: 3 — ABNORMAL LOW (ref 5–15)
BUN: 12 mg/dL (ref 8–23)
CO2: 23 mmol/L (ref 22–32)
Calcium: 7.5 mg/dL — ABNORMAL LOW (ref 8.9–10.3)
Chloride: 110 mmol/L (ref 98–111)
Creatinine, Ser: 1.02 mg/dL (ref 0.61–1.24)
GFR, Estimated: >60 mL/min (ref >60)
Glucose, Bld: 100 mg/dL — ABNORMAL HIGH (ref 70–99)
Potassium: 4.1 mmol/L (ref 3.5–5.1)
Sodium: 136 mmol/L (ref 135–145)

## 2021-06-12 LAB — GLUCOSE, CAPILLARY
Glucose-Capillary: 102 mg/dL — ABNORMAL HIGH (ref 70–99)
Glucose-Capillary: 104 mg/dL — ABNORMAL HIGH (ref 70–99)
Glucose-Capillary: 145 mg/dL — ABNORMAL HIGH (ref 70–99)
Glucose-Capillary: 170 mg/dL — ABNORMAL HIGH (ref 70–99)

## 2021-06-12 NOTE — Consult Note (Signed)
[facility] CM Inpatient Consult ? ? ?06/12/2021 ? ?[name] ?03-Jan-1929 ?[id number] ? ?[place] [facility] CM) ?  ?Patient chart has been reviewed with noted high risk score for unplanned readmissions.  Patient assessed for community [place] [facility] follow up needs. Per review, current recommendation is for SNF. Patient is currently active with CCM team at primary provider office.  ? ?Plan: Will notify outpatient RN case manager of patient disposition. ?  ?Of note, [facility] services does not replace or interfere with any services that are arranged by inpatient case management or social work.  ?  ?Netta Cedars, MSN, RN ?[facility] Liaison ?Toll free office [phone] ?

## 2021-06-12 NOTE — [place] FL2 (Signed)
?[place] MEDICAID FL2 LEVEL OF CARE SCREENING TOOL  ?  ? ?IDENTIFICATION  ?Patient Name: ?Mike Briggs Birthdate: 1928-05-15 Sex: male Admission Date (Current Location): ?06/08/2021  ?[place] and [place] Number: ? [place] and Address:  ?[facility] [address] [place] ?     Provider Number: ?[id number]  ?Attending Physician Name and Address:  ?Barton Dubois, MD ? Relative Name and Phone Number:  ?  ?   ?Current Level of Care: ?Hospital Recommended Level of Care: ?[place] Prior Approval Number: ?  ? ?Date Approved/Denied: ?  PASRR Number: ?[id number] A ? ?Discharge Plan: ?SNF ?  ? ?Current Diagnoses: ?Patient Active Problem List  ? Diagnosis Date Noted  ? Syncope and collapse 06/11/2021  ? Arthritis of hand 06/08/2021  ? Syncope 06/08/2021  ? T2DM (type 2 diabetes mellitus) ([place])   ? Hypertension   ? Chronic kidney disease   ? Chronic cystitis 04/25/2021  ? Hospital discharge follow-up 03/21/2021  ? Physical deconditioning 03/14/2021  ? UTI (urinary tract infection) 03/13/2021  ? Dementia without behavioral disturbance ([place]) 03/13/2021  ? Chronic kidney disease, stage 3b ([place]) 03/13/2021  ? Encounter for examination following treatment at hospital 03/08/2021  ? Dysphagia   ? Gastroesophageal reflux disease   ? Chest pain 01/26/2021  ? Foot ulcer ([place]) 11/24/2020  ? PVD (peripheral vascular disease) ([place]) 11/24/2020  ? Polyarthritis 12/10/2019  ? Hydrocele 05/27/2019  ? Mobitz type 1 second degree atrioventricular block   ? Sacral decubitus ulcer, stage III ([place]) 11/11/2018  ? Pressure injury of skin 11/08/2018  ? Essential hypertension 05/22/2016  ? Hemiparesis affecting right side as late effect of cerebrovascular accident (CVA) ([place]) 05/22/2016  ? BPH (benign prostatic hyperplasia) 05/22/2016  ? Chronic constipation 05/22/2016  ? Hyperlipidemia 05/22/2016  ? Pseudophakia 12/06/2015  ? Vascular dementia ([place]) 10/23/2012  ? Type 2 diabetes mellitus  with diabetic chronic kidney disease ([place]) 12/16/2008  ? ? ?Orientation RESPIRATION BLADDER Height & Weight   ?  ?Self, Place ? Normal Incontinent Weight: 225 lb 8.5 oz (102.3 kg) ?Height:  5\' 11"  (180.3 cm)  ?BEHAVIORAL SYMPTOMS/MOOD NEUROLOGICAL BOWEL NUTRITION STATUS  ?    Continent Diet (see dc summary)  ?AMBULATORY STATUS COMMUNICATION OF NEEDS Skin   ?Extensive Assist Verbally Normal ?  ?  ?  ?    ?     ?     ? ? ?Personal Care Assistance Level of Assistance  ?Bathing, Feeding, Dressing Bathing Assistance: Limited assistance ?Feeding assistance: Independent ?Dressing Assistance: Limited assistance ?   ? ?Functional Limitations Info  ?Sight, Hearing, Speech Sight Info: Impaired ?Hearing Info: Adequate ?Speech Info: Adequate  ? ? ?SPECIAL CARE FACTORS FREQUENCY  ?PT (By licensed PT), OT (By licensed OT)   ?  ?PT Frequency: 5x week ?OT Frequency: 3x week ?  ?  ?  ?   ? ? ?Contractures Contractures Info: Not present  ? ? ?Additional Factors Info  ?Code Status, Allergies Code Status Info: Full ?Allergies Info: NKA ?  ?  ?  ?   ? ?Current Medications (06/12/2021):  This is the current hospital active medication list ?Current Facility-Administered Medications  ?Medication Dose Route Frequency Provider Last Rate Last Admin  ? 0.9 %  sodium chloride infusion   Intravenous Continuous Barton Dubois, MD 10 mL/hr at 06/11/21 1515 Rate Change at 06/11/21 1515  ? acetaminophen (TYLENOL) tablet 650 mg  650 mg Oral Q6H PRN Arrien, Jimmy Picket, MD   650 mg at  06/09/21 2255  ? Or  ? acetaminophen (TYLENOL) suppository 650 mg  650 mg Rectal Q6H PRN Arrien, Jimmy Picket, MD      ? aspirin chewable tablet 81 mg  81 mg Oral Q breakfast Arrien, Jimmy Picket, MD   81 mg at 06/12/21 9924  ? atorvastatin (LIPITOR) tablet 10 mg  10 mg Oral Daily Arrien, Jimmy Picket, MD   10 mg at 06/12/21 0858  ? enoxaparin (LOVENOX) injection 50 mg  50 mg Subcutaneous Q24H Tawni Millers, MD   50 mg at 06/12/21 0859  ? insulin  aspart (novoLOG) injection 0-9 Units  0-9 Units Subcutaneous TID WC Arrien, Jimmy Picket, MD   1 Units at 06/11/21 1149  ? insulin glargine-yfgn (SEMGLEE) injection 10 Units  10 Units Subcutaneous Daily Arrien, Jimmy Picket, MD   10 Units at 06/10/21 1048  ? multivitamins with iron tablet 1 tablet  1 tablet Oral Daily Arrien, Jimmy Picket, MD   1 tablet at 06/12/21 [phone]  ? ondansetron (ZOFRAN) tablet 4 mg  4 mg Oral Q6H PRN Arrien, Jimmy Picket, MD      ? Or  ? ondansetron (ZOFRAN) injection 4 mg  4 mg Intravenous Q6H PRN Arrien, Jimmy Picket, MD      ? pantoprazole (PROTONIX) EC tablet 40 mg  40 mg Oral Daily Tawni Millers, MD   40 mg at 06/12/21 0858  ? polyethylene glycol (MIRALAX / GLYCOLAX) packet 17 g  17 g Oral Daily Bonnielee Haff, MD   17 g at 06/12/21 0859  ? protein supplement (ENSURE MAX) liquid  11 oz Oral Daily Bonnielee Haff, MD   11 oz at 06/12/21 0859  ? senna-docusate (Senokot-S) tablet 2 tablet  2 tablet Oral QHS Bonnielee Haff, MD   2 tablet at 06/11/21 2254  ? tamsulosin (FLOMAX) capsule 0.4 mg  0.4 mg Oral QPC supper Arrien, Jimmy Picket, MD   0.4 mg at 06/11/21 1718  ? thiamine tablet 250 mg  250 mg Oral Daily Arrien, Jimmy Picket, MD   250 mg at 06/12/21 4196  ? ? ? ?Discharge Medications: ?Please see discharge summary for a list of discharge medications. ? ?Relevant Imaging Results: ? ?Relevant Lab Results: ? ? ?Additional Information ?SSN: [id number] ? ?Shade Flood, LCSW ? ? ? ? ?

## 2021-06-12 NOTE — Consult Note (Signed)
Palliative Care Consult Note                                  Date: 06/12/2021   Patient Name: Mike Briggs  DOB: 1928/04/09  MRN: [id number]  Age / Sex: 88 y.o., male  PCP: [name], MD Referring Physician: Barton Dubois, MD  Reason for Consultation: Establishing goals of care  HPI/Patient Profile: 88 y.o. male  with past medical history of T2DM, hypertension, dementia, CVA and depression who was referred to the ED by his primary care after having a syncope episode at the outpatient clinic. He was admitted on 06/08/2021 with syncope, CKD IIIa, vascular dementia, and others. In the ED a CT didn't show acute findings. Recent hospitalization in March for UTI and December for chest pain, found to have Mobitz type I AV block at that time.  During current hospitalization suffered a vasovagal episode while using the commode on 06/09/21 for which a CODE BLUE was called, no resuscitation required and self-converted back into sinus rhythm. Work-up found no PE and 2D echo reassuring.  PMT was consulted for [place] conversation.  Past Medical History:  Diagnosis Date   Acute cystitis 12/17/2018   Acute encephalopathy 87/68/1157   Acute metabolic encephalopathy 2/62/0355   ANKLE, ARTHRITIS, DEGEN./OSTEO 12/16/2008   Qualifier: Diagnosis of  By: Aline Brochure MD, Dorothyann Peng     Arthritis    Cataract    Chronic kidney disease    COVID-19    COVID-19 virus detected 11/03/2018   COVID-19 virus infection 11/03/2018   Dehydration 11/02/2018   Depression    Diabetes mellitus    Gram-positive bacteremia 11/03/2018   Hyperlipidemia    Hypertension    Hypomagnesemia 03/24/2019   Klebsiella UTI  03/25/2019   Pain in finger of right hand 05/27/2019   Personal history of gout 05/22/2016   Rhabdomyolysis 11/02/2018   Sepsis secondary to UTI ([place]) 03/13/2021   Stroke ([place])    Swelling of both hands 05/27/2019   Syncope 02/25/2018   TIA (transient ischemic attack)  11/14/2018   UTI (urinary tract infection) 02/26/2018   Vitamin D deficiency 05/31/2016    Subjective:   This NP Walden Field reviewed medical records, received report from team, assessed the patient and then meet at the patient's bedside to discuss diagnosis, prognosis, GOC, EOL wishes disposition and options.  I met with the patient at the bedside, no family present.   Concept of Palliative Care was introduced as specialized medical care for people and their families living with serious illness.  If focuses on providing relief from the symptoms and stress of a serious illness.  The goal is to improve quality of life for both the patient and the family. Values and goals of care important to patient and family were attempted to be elicited.  Created space and opportunity for patient  and family to explore thoughts and feelings regarding current medical situation   Natural trajectory and current clinical status were discussed. Questions and concerns addressed. Patient  encouraged to call with questions or concerns.    Patient/Family Understanding of Illness: The patient has no recollection of his syncopal episode at his doctor's office.  He seems surprised when I informed him what happened.  He states he is not sure why he is in the hospital today.  However, upon discussion he is oriented x3 (person, place, time).  Query capacity to make decisions, further investigation needs to  occur.  I informed him that he had a heart arrhythmia that caused him to pass out.  Fortunately there is no sign that he has had a stroke.  We discussed that he has chronic kidney disease as well.  He states that he is married, although there is no wife listed on his chart.  When asked about his nieces Estelle Grumbles he confirms that their relationship and gives the authorization to discuss his care with them.  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  He states he is married.  He states he  has a strong [place] man and, when offered, except chaplaincy consult.  He does not have a lot of hobbies as he was on the road a lot.  Patient Values: Family, faith  Goals: Discharge home to rehab  Today's Discussion: In addition to the life review and discussion about his colonic and acute situations we had further discussion on various topics.  Specifically I began a discussion on CODE STATUS.  Although I am not 100% convinced that he has capacity to make this decision I wanted to gauge his response.  He states that in the event that his heart stopped and lung stopped he would not want aggressive resuscitation efforts, indicating he prefers to be a DNR.  However, I will need to discuss this further with his family before making an official change.  Again, he gave me permission to contact his family.  I offered to visit with him again tomorrow and he agreed.  I provided emotional general support through therapeutic listening, empathy, sharing stories, and other techniques.  Answered all questions and addressed all concerns to the best of my ability.  Review of Systems  Respiratory:  Negative for cough and shortness of breath.   Cardiovascular:  Negative for chest pain.  Gastrointestinal:  Negative for abdominal pain, nausea and vomiting.   Objective:   Primary Diagnoses: Present on Admission:  Syncope  Chronic kidney disease  Syncope and collapse   Physical Exam Vitals and nursing note reviewed.  Constitutional:      General: He is not in acute distress. HENT:     Head: Normocephalic and atraumatic.  Cardiovascular:     Rate and Rhythm: Normal rate.  Pulmonary:     Effort: Pulmonary effort is normal. No respiratory distress.  Abdominal:     General: Abdomen is flat.     Palpations: Abdomen is soft.  Skin:    General: Skin is warm and dry.  Neurological:     Mental Status: He is alert and oriented to person, place, and time.  Psychiatric:        Mood and Affect: Mood  normal.        Behavior: Behavior normal.    Vital Signs:  BP (!) 149/72 (BP Location: Right Arm)   Pulse 96   Temp 98.9 F (37.2 C)   Resp 17   Ht _0  (1.803 m)   Wt 102.3 kg   SpO2 99%   BMI 31.46 kg/m   Palliative Assessment/Data: 40%    Advanced Care Planning:   Primary Decision Maker: OTHER: To be determined  Code Status/Advance Care Planning: Full code  A discussion was had today regarding advanced directives. Concepts specific to code status, artifical feeding and hydration, continued IV antibiotics and rehospitalization was had.  The difference between a aggressive medical intervention path and a palliative comfort care path for this patient at this time was  had.   Decisions/Changes to ACP: None today  Assessment & Plan:   Impression: 88 year old male with acute and chronic illnesses now admitted for syncopal episode.  Known Mobitz 1 AV block, not a candidate for permanent pacemaker.  Having nonsustained episodes and no need for rhythm control at this time.  Baseline creatinine 1.0 and he appears to be at baseline today with his chronic kidney disease.  He does have vascular dementia history and appears at baseline.  He is oriented x3, although I cannot be sure that he understands his current clinical situation.  This is his fourth hospitalization in 6 months and remains at high risk for rehospitalization.  Overall long-term prognosis guarded.  SUMMARY OF RECOMMENDATIONS   Continue full code for now Attempt to discuss with family CODE STATUS Attempt another CODE STATUS discussion with the patient tomorrow and gauge his response compared to today Further GOC decisions based on patient and family input PMT will continue to follow  Symptom Management:  Per primary team PMT is available to assist as needed  Prognosis:  Unable to determine  Discharge Planning:  To Be Determined   Discussed with: Medical team, nursing team, patient    Thank you for  allowing [place] to participate in the care of [name] PMT will continue to support holistically.  Time Total: 80 min  Greater than 50%  of this time was spent counseling and coordinating care related to the above assessment and plan.  Signed by: Walden Field, NP Palliative Medicine Team  Team Phone # [phone] (Nights/Weekends)  06/12/2021, 4:07 PM

## 2021-06-12 NOTE — TOC Initial Note (Signed)
Transition of Care (TOC) - Initial/Assessment Note  ? ? ?Patient Details  ?Name: Mike Briggs ?MRN: [id number] ?Date of Birth: 07-04-1928 ? ?Transition of Care (TOC) CM/SW Contact:    ?Shade Flood, LCSW ?Phone Number: ?06/12/2021, 12:20 PM ? ?Clinical Narrative:                 ? ?TOC consulted for SNF placement. Per MD, pt medically stable for dc once placement arranged. Pt's H&P states pt with dementia. Spoke with pt's niece by phone to assess. Niece states that pt lives alone in his apartment but that he has CAP aides from  the time he gets up in the AM until 7pm. She states that he has DME for home use. Per niece, pt does not have family that can stay with him overnight. She states that family has discussed SNF with pt who reluctantly agreed to referrals.  ? ?Pt will need insurance authorization prior to dc to SNF. TOC will follow. ? ?Expected Discharge Plan: [place] ?Barriers to Discharge: Ship broker, SNF Pending bed offer ? ? ?Patient Goals and CMS Choice ?Patient states their goals for this hospitalization and ongoing recovery are:: go home ?CMS Medicare.gov Compare Post Acute Care list provided to:: Patient Represenative (must comment) ?Choice offered to / list presented to :  (niece) ? ?Expected Discharge Plan and Services ?Expected Discharge Plan: [place] ?In-house Referral: Clinical Social Work ?  ?  ?Living arrangements for the past 2 months: Apartment ?                ?  ?  ?  ?  ?  ?  ?  ?  ?  ?  ? ?Prior Living Arrangements/Services ?Living arrangements for the past 2 months: Apartment ?Lives with:: Self ?Patient language and need for interpreter reviewed:: Yes ?Do you feel safe going back to the place where you live?: Yes      ?Need for Family Participation in Patient Care: Yes (Comment) ?Care giver support system in place?: Yes (comment) ?Current home services: Sitter, Safety alert, DME ?Criminal Activity/Legal Involvement Pertinent to Current  Situation/Hospitalization: No - Comment as needed ? ?Activities of Daily Living ?Home Assistive Devices/Equipment: None ?ADL Screening (condition at time of admission) ?Patient's cognitive ability adequate to safely complete daily activities?: No ?Is the patient deaf or have difficulty hearing?: No ?Does the patient have difficulty seeing, even when wearing glasses/contacts?: No ?Does the patient have difficulty concentrating, remembering, or making decisions?: Yes (at times) ?Patient able to express need for assistance with ADLs?: Yes ?Does the patient have difficulty dressing or bathing?: Yes ?Independently performs ADLs?: No ?Communication: Independent ?Dressing (OT): Needs assistance ?Is this a change from baseline?: Change from baseline, expected to last <3days ?Grooming: Needs assistance ?Is this a change from baseline?: Change from baseline, expected to last <3 days ?Feeding: Needs assistance ?Is this a change from baseline?: Change from baseline, expected to last <3 days ?Bathing: Needs assistance ?Is this a change from baseline?: Change from baseline, expected to last <3 days ?Toileting: Needs assistance ?Is this a change from baseline?: Change from baseline, expected to last <3 days ?In/Out Bed: Needs assistance ?Is this a change from baseline?: Change from baseline, expected to last <3 days ?Walks in Home: Needs assistance ?Is this a change from baseline?: Change from baseline, expected to last <3 days ?Does the patient have difficulty walking or climbing stairs?: Yes ?Weakness of Legs: Both ?Weakness of Arms/Hands: Both ? ?Permission Sought/Granted ?Permission sought to  share information with : Customer service manager ?Permission granted to share information with : Yes, Verbal Permission Granted ?   ? Permission granted to share info w AGENCY: snfs ?   ?   ? ?Emotional Assessment ?  ?  ?  ?Orientation: : Oriented to Self, Oriented to Place ?Alcohol / Substance Use: Not Applicable ?Psych  Involvement: No (comment) ? ?Admission diagnosis:  Syncope and collapse [R55] ?Syncope [R55] ?Patient Active Problem List  ? Diagnosis Date Noted  ? Syncope and collapse 06/11/2021  ? Arthritis of hand 06/08/2021  ? Syncope 06/08/2021  ? T2DM (type 2 diabetes mellitus) ([place])   ? Hypertension   ? Chronic kidney disease   ? Chronic cystitis 04/25/2021  ? Hospital discharge follow-up 03/21/2021  ? Physical deconditioning 03/14/2021  ? UTI (urinary tract infection) 03/13/2021  ? Dementia without behavioral disturbance ([place]) 03/13/2021  ? Chronic kidney disease, stage 3b ([place]) 03/13/2021  ? Encounter for examination following treatment at hospital 03/08/2021  ? Dysphagia   ? Gastroesophageal reflux disease   ? Chest pain 01/26/2021  ? Foot ulcer ([place]) 11/24/2020  ? PVD (peripheral vascular disease) ([place]) 11/24/2020  ? Polyarthritis 12/10/2019  ? Hydrocele 05/27/2019  ? Mobitz type 1 second degree atrioventricular block   ? Sacral decubitus ulcer, stage III ([place]) 11/11/2018  ? Pressure injury of skin 11/08/2018  ? Essential hypertension 05/22/2016  ? Hemiparesis affecting right side as late effect of cerebrovascular accident (CVA) ([place]) 05/22/2016  ? BPH (benign prostatic hyperplasia) 05/22/2016  ? Chronic constipation 05/22/2016  ? Hyperlipidemia 05/22/2016  ? Pseudophakia 12/06/2015  ? Vascular dementia ([place]) 10/23/2012  ? Type 2 diabetes mellitus with diabetic chronic kidney disease ([place]) 12/16/2008  ? ?PCP:  [name], MD ?Pharmacy:   ?[facility] [place] ?[place] [address] ?Phone: [phone] Fax: [phone] ? ? ? ? ?Social Determinants of Health (SDOH) Interventions ?  ? ?Readmission Risk Interventions ? ?  06/12/2021  ? 12:19 PM 03/25/2019  ?  4:26 PM  ?Readmission Risk Prevention Plan  ?Transportation Screening Complete Complete  ?[place] or Home Care Consult Complete Complete  ?Social Work Consult for [place] Planning/Counseling Complete Complete   ?Palliative Care Screening Not Applicable Not Applicable  ?Medication Review Press photographer) Complete Complete  ? ? ? ?

## 2021-06-12 NOTE — TOC Progression Note (Signed)
Transition of Care (TOC) - Progression Note  ? ? ?Patient Details  ?Name: Mike Briggs ?MRN: [id number] ?Date of Birth: 06-22-1928 ? ?Transition of Care (TOC) CM/SW Contact  ?Shade Flood, LCSW ?Phone Number: ?06/12/2021, 3:30 PM ? ?Clinical Narrative:    ? ?Met with pt at bedside to review SNF bed offers. Pt accepts [facility]. Updated [facility] and requested they start auth as pt is not managed by [facility]. Attempted to update family but no answer. ? ?Will follow up in AM. ? ?Expected Discharge Plan: [place] ?Barriers to Discharge: Ship broker, SNF Pending bed offer ? ?Expected Discharge Plan and Services ?Expected Discharge Plan: [place] ?In-house Referral: Clinical Social Work ?  ?  ?Living arrangements for the past 2 months: Apartment ?                ?  ?  ?  ?  ?  ?  ?  ?  ?  ?  ? ? ?Social Determinants of Health (SDOH) Interventions ?  ? ?Readmission Risk Interventions ? ?  06/12/2021  ? 12:19 PM 03/25/2019  ?  4:26 PM  ?Readmission Risk Prevention Plan  ?Transportation Screening Complete Complete  ?[place] or Home Care Consult Complete Complete  ?Social Work Consult for [place] Planning/Counseling Complete Complete  ?Palliative Care Screening Not Applicable Not Applicable  ?Medication Review Press photographer) Complete Complete  ? ? ?

## 2021-06-12 NOTE — Progress Notes (Signed)
? ?[facility] ?PROGRESS NOTE ? ? ?Mike Briggs MHD:[id number] DOB: 08-Dec-1928 DOA: 06/08/2021 ? ?PCP: [name], MD ? ?Brief History/Interval Summary: 88 y.o. male with medical history significant of T2DM, hypertension, dementia, CVA and depression who was referred to the ED by his primary care after having a syncope episode at the outpatient clinic. While at the Primary care office went unresponsive he was not responsive to verbal stimuli but was responding to pain with eye movement. He was not able to move his left upper extremity and recovered slowly his consciousness. EMS was called and patient was brought to the ED for CVA evaluation.  He had no recollection of these events.  CT head did not show any acute findings.  He was hospitalized back in March for urinary tract infection.  He was hospitalized in February for acute encephalopathy thought to be secondary to UTI.  Hospitalized back in December for atypical chest pain and dysphagia.  Found to have Mobitz type I AV block at that time. ? ?Consultants: Cardiology ? ?Procedures: See below for x-ray reports.  2D echo. ? ? ?Subjective/Interval History: ?In no major distress; afebrile, no chest pain, no nausea, no vomiting.  Oriented x2 and following commands appropriately.  Denies lightheadedness, dizziness or any further syncope/near syncope events. ? ?Assessment/Plan: ?Patient with episode of vasovagal while using commode on 06/09/2021; CODE BLUE was activated.  No resuscitation required; patient converted back into sinus rhythm on his own.  2D echo reassuring and CT angiogram of the chest given elevated D-dimer demonstrated no pulmonary embolism. ? ?Patient remains hemodynamically stable and just high risk for falling and with ongoing deconditioning.  Unsafe discharge place that he can receive 24 hours assistance in care. ? ?Syncope ?-Etiology is unclear.  Apparently he was in his primary care physician's office when he suddenly passed out.   Arrhythmia is on the top of the differential.  Continue with telemetry.   ?-Appreciate cardiology assistance and recommendation. ?-TSH within normal limits. ?-He appears to have right-sided weakness. CT head did not show any acute findings.  According to PCP notes he does have residual right-sided weakness from old stroke.   ?-MRI without acute abnormality ?-Electroencephalogram demonstrated no epilepsy ?-PT initially has recommended patient to go back home with home health services; patient is maximal assistance just to transfer out of bed and essentially lives alone making him very unsafe due to poor balance and deconditioning. ?-Family has discussed with patient and he is in agreement to pursuit skilled nursing facility placement. ?-TOC has been made aware. ?-Physical therapy has once again attempt to work with patient and recommended home health PT as long as the patient has frequent/constant supervision and assistance (which is not able to be provided at this point).  After discussing with family on 06/10/2021 decision has been made to proceed to skilled nursing facility placement for rehabilitation and conditioning while providing further care. ?-High risk for falling. ? ?Abnormal EKG ?-History of Mobitz type I is present.   ?-There have been no sustained type II heart block appreciated along with PACs.  No atrial fibrillation. ?-Case discussed with cardiology service who has recommended the patient is not a candidate for pacemaker or anticoagulation ?-Given nonsustained episodes appreciated currently; there is no need for rate control agents. ?-2D echo reassuring. ? ?Diabetes mellitus type 2 ?-Monitor CBGs fluctuation and adjust hypoglycemic regimen as needed..   ?-Continue SSI and gargline. ? ?Essential hypertension ?-Monitor blood pressures closely.  Noted to be on amlodipine and lisinopril prior  to admission.   ?-These are currently held given concern for orthostatic component.. ?-Continue to follow vital  signs and further adjust antihypertensive treatment as required.. ? ?Chronic kidney disease stage IIIa ?-Renal function has remained stable and at baseline currently. ?-Continue to monitor renal function trend.. ? ?Hypokalemia and hypomagnesemia ?-Repleted ?-Goal is to keep magnesium above 2 and potassium above 4. ?-Continue to follow electrolytes trend and stability. ?-Continue to maintain adequate hydration. ? ?Normocytic anemia ?-No evidence of overt bleeding. ?-Continue to follow hemoglobin trend intermittently. ? ?Vascular dementia ?-Supposed to be on Namenda prior to admission but is not listed on his home medication list. ?-Continue supportive care and constant reorientation. ?-Mentation at baseline currently. ? ?Obesity ?Estimated body mass index is 31.46 kg/m? as calculated from the following: ?  Height as of this encounter: 5\' 11"  (1.803 m). ?  Weight as of this encounter: 102.3 kg. ? ?Goals of care ?-This is patient's fourth hospitalization in the last 6 months. -Palliative care consulted ?-Follow recommendations of goals of care discussion/advance care planning. ?-Patient remains full code. ? ?DVT Prophylaxis: Lovenox ?Code Status: Full code ?Family Communication: No family at bedside.  His niece apparently is the primary caregiver; and she has been updated over the phone. ? ?Disposition Plan: Lives at home, during the day with aide assistance and alone throughout the night.  Patient currently maximal assistance just to get out of bed, and demonstrating poor balance.  Niece cares for him.  But he does live by himself; afraid for unsafe discharge and asking for nursing home placement.  Patient is medically stable and TOC is assisting with placement.  Waiting insurance authorization. ? ?Status is: Inpatient. ? ? ?Medications: Scheduled: ? aspirin  81 mg Oral Q breakfast  ? atorvastatin  10 mg Oral Daily  ? enoxaparin (LOVENOX) injection  50 mg Subcutaneous Q24H  ? insulin aspart  0-9 Units Subcutaneous TID  WC  ? insulin glargine-yfgn  10 Units Subcutaneous Daily  ? multivitamins with iron  1 tablet Oral Daily  ? pantoprazole  40 mg Oral Daily  ? polyethylene glycol  17 g Oral Daily  ? Ensure Max Protein  11 oz Oral Daily  ? senna-docusate  2 tablet Oral QHS  ? tamsulosin  0.4 mg Oral QPC supper  ? thiamine  250 mg Oral Daily  ? ?Continuous: ? sodium chloride 10 mL/hr at 06/11/21 1515  ? ?PQZ:[id number] **OR** acetaminophen, ondansetron **OR** ondansetron (ZOFRAN) IV ? ?Antibiotics: ?Anti-infectives (From admission, onward)  ? ? None  ? ?  ? ? ?Objective: ? ?Vital Signs ? ?Vitals:  ? 06/11/21 1250 06/11/21 2008 06/12/21 0315 06/12/21 1234  ?BP: (!) 141/74 (!) 134/59 (!) 143/58 (!) 149/72  ?Pulse: 95 61 72 96  ?Resp: 20 18 18 17   ?Temp: 98.4 ?F (36.9 ?C) 98.9 ?F (37.2 ?C) 98.8 ?F (37.1 ?C) 98.9 ?F (37.2 ?C)  ?TempSrc: Oral Oral Oral   ?SpO2: 100% 99% 97% 99%  ?Weight:      ?Height:      ? ? ?Intake/Output Summary (Last 24 hours) at 06/12/2021 1736 ?Last data filed at 06/12/2021 1300 ?Gross per 24 hour  ?Intake 240 ml  ?Output 1300 ml  ?Net -1060 ml  ? ?Filed Weights  ? 06/08/21 1514  ?Weight: 102.3 kg  ?General exam: Alert, awake, oriented x 2; following commands appropriately pleasant and in no acute distress.  Still significantly weak and deconditioned requiring 2+ assist just for transferring. ?Respiratory system: Clear to auscultation. Respiratory effort normal.  No using  accessory muscle.  Good saturation on room air. ?Cardiovascular system: Rate controlled, no rubs, no gallops, no JVD on exam. ?Gastrointestinal system: Abdomen is nondistended, soft and nontender. No organomegaly or masses felt. Normal bowel sounds heard. ?Central nervous system: No focal neurological deficits. ?Extremities: No cyanosis or clubbing. ?Skin: No petechiae. ?Psychiatry: Mood & affect appropriate.  ? ? ?Lab Results: ? ?Data Reviewed: I have personally reviewed following labs and reports of the imaging studies ? ?CBC: ?Recent Labs  ?Lab  06/08/21 ?1613 06/09/21 ?0416 06/10/21 ?0631 06/11/21 ?0601  ?WBC 13.1* 12.8* 11.8* 10.9*  ?NEUTROABS 9.6*  --   --   --   ?HGB 10.6* 10.8* 9.6* 9.0*  ?HCT 34.6* 35.9* 29.9* 28.8*  ?MCV 89.6 89.3 86.9 86

## 2021-06-12 NOTE — Progress Notes (Signed)
Physical Therapy Treatment ?Patient Details ?Name: Mike Briggs ?MRN: [id number] ?DOB: [birth year] ?Today's Date: 06/12/2021 ? ? ?History of Present Illness Mike Briggs is a 88 y.o. male with medical history significant of T2DM, hypertension, dementia, CVA and depression who was referred to the ED by his primary care after having a syncope episode at the outpatient clinic today.   While at the Primary care office went unresponsive he was not responsive to verbal stimuli but was responding to pain with eye movement. He was not able to move his left upper extremity and recovered slowly his consciousness. EMS was called and patient was brought to the ED for CVA evaluation.      When direct questioning patient does not remember the events that occurred today. He denies and recent change in his health.   He lives by himself.  In the Ed he was very weak and not able to stand. He was referred for admission and further evaluation ? ?  ?PT Comments  ? ? Patient demonstrates slow labored movement for sitting up at bedside with fair carryover for using bed rail with HOB raised, fair return for completing BLE ROM/strengthening exercises requiring verbal cueing and demonstration while seated at bedside.  Patient tolerated 3 trials of partial sit to stands, but unable to extend trunk, lock knees or fully stand due to weakness with frequent buckling of knees.  Patient required 2 person assist with bed in head down position when put back to bed.  Patient will benefit from continued skilled physical therapy in hospital and recommended venue below to increase strength, balance, endurance for safe ADLs and gait.  ? ?   ?Recommendations for follow up therapy are one component of a multi-disciplinary discharge planning process, led by the attending physician.  Recommendations may be updated based on patient status, additional functional criteria and insurance authorization. ? ?Follow Up Recommendations ? Skilled nursing-short term  rehab (<3 hours/day) ?  ?  ?Assistance Recommended at Discharge Intermittent Supervision/Assistance  ?Patient can return home with the following A lot of help with walking and/or transfers;A lot of help with bathing/dressing/bathroom;Assistance with cooking/housework;Help with stairs or ramp for entrance;Assistance with feeding;Assist for transportation ?  ?Equipment Recommendations ? Other (comment) (Mechanical lift for home use)  ?  ?Recommendations for Other Services   ? ? ?  ?Precautions / Restrictions Precautions ?Precautions: Fall ?Restrictions ?Weight Bearing Restrictions: No  ?  ? ?Mobility ? Bed Mobility ?Overal bed mobility: Needs Assistance ?Bed Mobility: Supine to Sit ?  ?  ?Supine to sit: Mod assist, HOB elevated ?  ?  ?General bed mobility comments: increased time, labored movement, fair return for using bed rail with right hand ?  ? ?Transfers ?Overall transfer level: Needs assistance ?Equipment used: Rolling walker (2 wheels) ?Transfers: Sit to/from Stand ?Sit to Stand: Max assist ?  ?  ?  ?  ?  ?General transfer comment: unable to come to full standing due to BLE weakness with buckling of knees ?  ? ?Ambulation/Gait ?  ?  ?  ?  ?  ?  ?  ?  ? ? ?Stairs ?  ?  ?  ?  ?  ? ? ?Wheelchair Mobility ?  ? ?Modified Rankin (Stroke Patients Only) ?  ? ? ?  ?Balance Overall balance assessment: Needs assistance ?Sitting-balance support: Feet supported, No upper extremity supported ?Sitting balance-Leahy Scale: Good ?Sitting balance - Comments: seated EOB ?  ?Standing balance support: Reliant on assistive device for balance, During functional activity,  Bilateral upper extremity supported ?Standing balance-Leahy Scale: Zero ?Standing balance comment: poor/zero using RW ?  ?  ?  ?  ?  ?  ?  ?  ?  ?  ?  ?  ? ?  ?Cognition Arousal/Alertness: Awake/alert ?Behavior During Therapy: [facility] for tasks assessed/performed ?Overall Cognitive Status: Within Functional Limits for tasks assessed ?  ?  ?  ?  ?  ?  ?  ?  ?  ?  ?  ?  ?   ?  ?  ?  ?  ?  ?  ? ?  ?Exercises General Exercises - Lower Extremity ?Long Arc Quad: Seated, AROM, Strengthening, Both, 10 reps ?Hip Flexion/Marching: Seated, AROM, Strengthening, Both, 10 reps ?Toe Raises: Seated, AROM, Strengthening, Both, 10 reps ?Heel Raises: Seated, AROM, Strengthening, Both, 10 reps ? ?  ?General Comments   ?  ?  ? ?Pertinent Vitals/Pain Pain Assessment ?Pain Assessment: No/denies pain  ? ? ?Home Living   ?  ?  ?  ?  ?  ?  ?  ?  ?  ?   ?  ?Prior Function    ?  ?  ?   ? ?PT Goals (current goals can now be found in the care plan section) Acute Rehab PT Goals ?Patient Stated Goal: return home with caregivers to assist ?PT Goal Formulation: With patient ?Time For Goal Achievement: 06/19/21 ?Potential to Achieve Goals: Fair ?Progress towards PT goals: Progressing toward goals ? ?  ?Frequency ? ? ? Min 3X/week ? ? ? ?  ?PT Plan Discharge plan needs to be updated  ? ? ?Co-evaluation   ?  ?  ?  ?  ? ?  ?AM-PAC PT "6 Clicks" Mobility   ?Outcome Measure ? Help needed turning from your back to your side while in a flat bed without using bedrails?: A Little ?Help needed moving from lying on your back to sitting on the side of a flat bed without using bedrails?: A Lot ?Help needed moving to and from a bed to a chair (including a wheelchair)?: Total ?Help needed standing up from a chair using your arms (e.g., wheelchair or bedside chair)?: A Lot ?Help needed to walk in hospital room?: Total ?Help needed climbing 3-5 steps with a railing? : Total ?6 Click Score: 10 ? ?  ?End of Session   ?Activity Tolerance: Patient tolerated treatment well;Patient limited by fatigue ?Patient left: in bed;with call bell/phone within reach ?Nurse Communication: Mobility status ?PT Visit Diagnosis: Unsteadiness on feet (R26.81);Other abnormalities of gait and mobility (R26.89);Muscle weakness (generalized) (M62.81) ?  ? ? ?Time: 4492-0100 ?PT Time Calculation (min) (ACUTE ONLY): 25 min ? ?Charges:  $Therapeutic Exercise:  8-22 mins ?$Therapeutic Activity: 8-22 mins          ?          ? ?2:30 PM, 06/12/21 ?Lonell Grandchild, MPT ?Physical Therapist with [place] ?[facility] ?[phone] office ?[phone] mobile phone ? ? ?

## 2021-06-13 DIAGNOSIS — I10 Essential (primary) hypertension: Secondary | ICD-10-CM | POA: Diagnosis not present

## 2021-06-13 DIAGNOSIS — Z515 Encounter for palliative care: Secondary | ICD-10-CM | POA: Diagnosis not present

## 2021-06-13 DIAGNOSIS — E1122 Type 2 diabetes mellitus with diabetic chronic kidney disease: Secondary | ICD-10-CM | POA: Diagnosis not present

## 2021-06-13 DIAGNOSIS — R55 Syncope and collapse: Secondary | ICD-10-CM | POA: Diagnosis not present

## 2021-06-13 DIAGNOSIS — N1831 Chronic kidney disease, stage 3a: Secondary | ICD-10-CM | POA: Diagnosis not present

## 2021-06-13 DIAGNOSIS — Z7189 Other specified counseling: Secondary | ICD-10-CM | POA: Diagnosis not present

## 2021-06-13 LAB — GLUCOSE, CAPILLARY
Glucose-Capillary: 94 mg/dL (ref 70–99)
Glucose-Capillary: 179 mg/dL — ABNORMAL HIGH (ref 70–99)

## 2021-06-13 MED ORDER — ACETAMINOPHEN 325 MG PO TABS: 650.0000 mg | ORAL_TABLET | Freq: Four times a day (QID) | ORAL | Status: AC | PRN

## 2021-06-13 MED ORDER — SENNOSIDES-DOCUSATE SODIUM 8.6-50 MG PO TABS: 2.0000 | ORAL_TABLET | Freq: Every day | ORAL | Status: AC

## 2021-06-13 MED ORDER — ENSURE MAX PROTEIN PO LIQD: 11.0000 [oz_av] | Freq: Every day | ORAL | Status: AC

## 2021-06-13 MED ORDER — GUAIFENESIN 100 MG/5ML PO LIQD
5.0000 mL | ORAL | Status: DC | PRN
  Administered 2021-06-13: 5 mL via ORAL
  Filled 2021-06-13: qty 5

## 2021-06-13 MED ORDER — MEMANTINE HCL 10 MG PO TABS: 10.0000 mg | ORAL_TABLET | Freq: Every day | ORAL | Status: DC

## 2021-06-13 MED ORDER — POLYETHYLENE GLYCOL 3350 17 G PO PACK: 17.0000 g | PACK | Freq: Every day | ORAL | 0 refills | Status: AC

## 2021-06-13 NOTE — Discharge Summary (Addendum)
?Physician Discharge Summary ?  ?Patient: Mike Briggs MRN: [id number] DOB: 02-25-1928  ?Admit date:     06/08/2021  ?Discharge date: 06/13/21  ?Discharge Physician: Barton Dubois  ? ?PCP: [name], MD  ? ?Recommendations at discharge:  ?Repeat basic metabolic panel to follow electrolytes and renal function ?Reassess blood pressure and adjust antihypertensive regimen as needed. ?Goals of care discussion and advance care planning recommended. ? ?Discharge Diagnoses: ?Principal Problem: ?  Syncope and collapse ?Active Problems: ?  Syncope ?  T2DM (type 2 diabetes mellitus) ([place]) ?  Hypertension ?  Chronic kidney disease ? ? ?Hospital Course: ?88 y.o. male with medical history significant of T2DM, hypertension, dementia, CVA and depression who was referred to the ED by his primary care after having a syncope episode at the outpatient clinic. While at the Primary care office went unresponsive he was not responsive to verbal stimuli but was responding to pain with eye movement. He was not able to move his left upper extremity and recovered slowly his consciousness. EMS was called and patient was brought to the ED for CVA evaluation.  He had no recollection of these events.  CT head did not show any acute findings.  He was hospitalized back in March for urinary tract infection.  He was hospitalized in February for acute encephalopathy thought to be secondary to UTI.  Hospitalized back in December for atypical chest pain and dysphagia.  Found to have Mobitz type I AV block at that time. ? ?Assessment and Plan: ?Syncope ?-Etiology is unclear.  Apparently he was in his primary care physician's office when he suddenly passed out. Arrhythmia is on the top of the differential.   ?-Appreciate cardiology assistance and recommendation. ?-TSH within normal limits. ?-He appears to have right-sided weakness. CT head did not show any acute findings.  According to PCP notes he does have residual right-sided weakness from old  stroke.   ?-MRI without acute abnormality ?-Electroencephalogram demonstrated no epilepsy ?-PT initially has recommended patient to go back home with home health services; patient is maximal assistance just to transfer out of bed and essentially lives alone making him very unsafe due to poor balance and deconditioning. ?-Family has discussed with patient and he is in agreement to pursuit skilled nursing facility placement. ?-TOC has been made aware and has helped with nursing home placement. ?-Patient remains High risk for falling. ?-After all arrangements were made, family changed her mind and decided to take patient home with home health services. ?  ?Abnormal EKG ?-History of Mobitz type I present and appreciated.   ?-There have been no sustained type II heart block appreciated along with PACs.  No atrial fibrillation. ?-Case discussed with cardiology service who has recommended the patient is not a candidate for pacemaker or anticoagulation ?-Given nonsustained episodes appreciated currently; there is no need for rate control agents. ?-2D echo reassuring. ?-Avoid AV node block er agents. ?-Outpatient follow-up with cardiology service recommended. ?  ?Diabetes mellitus type 2 ?-Continue to monitor CBGs fluctuation and adjust hypoglycemic regimen as needed..   ?-Resume home hypoglycemic regimen and modify carbohydrate diet. ? ?Essential hypertension ?-Monitor blood pressures closely.  Noted to be on amlodipine and lisinopril prior to admission.   ?-Given ongoing orthostasis this medication has been discontinued currently. ?-Continue to follow vital signs and further adjust antihypertensive regimen as required.. ? ?Chronic kidney disease stage IIIa ?-Renal function has remained stable and at baseline currently. ?-Continue to monitor renal function trend.. ?  ?Hypokalemia and hypomagnesemia ?-Repleted ?-Goal  is to keep magnesium above 2 and potassium above 4. ?-Continue to follow electrolytes trend and  stability. ?-Continue to maintain adequate hydration. ? ?Normocytic anemia ?-No evidence of overt bleeding. ?-Continue to follow hemoglobin trend intermittently. ?  ?Vascular dementia ?-Resume daily Namenda. ?-Continue supportive care and constant reorientation. ?-Mentation at baseline currently. ? ?Obesity ?Body mass index is 31.46 kg/m?. ?-Low calorie diet and portion control discussed with patient. ?  ?Consultants: Cardiology service and palliative care. ?Procedures performed: See below for x-ray reports. ?Disposition: Skilled nursing facility placement. ?Diet recommendation: Low calorie, modified carbohydrate and heart healthy diet. ? ?DISCHARGE MEDICATION: ?Allergies as of 06/13/2021   ?No Known Allergies ?  ? ?  ?Medication List  ?  ? ?STOP taking these medications   ? ?amLODipine 10 MG tablet ?Commonly known as: NORVASC ?  ?lisinopril 40 MG tablet ?Commonly known as: ZESTRIL ?  ? ?  ? ?TAKE these medications   ? ?acetaminophen 325 MG tablet ?Commonly known as: TYLENOL ?Take 2 tablets (650 mg total) by mouth every 6 (six) hours as needed for mild pain or headache (or Fever >/= 101). ?  ?aspirin 81 MG chewable tablet ?Chew 1 tablet (81 mg total) by mouth daily with breakfast. ?  ?atorvastatin 10 MG tablet ?Commonly known as: LIPITOR ?TAKE 1 TABLET BY MOUTH AFTER SUPPER. ?What changed: See the new instructions. ?  ?blood glucose meter kit and supplies ?Dispense based on patient and insurance preference. Use up to four times daily as directed. (FOR ICD-10 E10.9, E11.9). ?  ?diclofenac Sodium 1 % Gel ?Commonly known as: VOLTAREN ?Apply 4 g topically 3 (three) times daily as needed (arthritic wrist pain). ?  ?Ensure Max Protein Liqd ?Take 330 mLs (11 oz total) by mouth daily. ?Start taking on: Jun 14, 2021 ?  ?Lantus SoloStar 100 UNIT/ML Solostar Pen ?Generic drug: insulin glargine ?Inject 20 Units into the skin daily. ?  ?memantine 10 MG tablet ?Commonly known as: NAMENDA ?Take 1 tablet (10 mg total) by mouth  daily. ?What changed: when to take this ?  ?pantoprazole 40 MG tablet ?Commonly known as: PROTONIX ?TAKE ONE TABLET BY MOUTH ONCE DAILY. ?  ?polyethylene glycol 17 g packet ?Commonly known as: MIRALAX / GLYCOLAX ?Take 17 g by mouth daily. ?Start taking on: Jun 14, 2021 ?  ?senna-docusate 8.6-50 MG tablet ?Commonly known as: Senokot-S ?Take 2 tablets by mouth at bedtime. ?  ?Sure Comfort Pen Needles 31G X 5 MM Misc ?Generic drug: Insulin Pen Needle ?USE TO INJECT AS DIRECTED FOUR TIMES A DAY. WITH MEALS AND BEFORE BEDTIME. ?  ?tamsulosin 0.4 MG Caps capsule ?Commonly known as: FLOMAX ?TAKE 1 CAPSULE BY MOUTH DAILY AFTER BREAKFAST. ?What changed:  ?how much to take ?how to take this ?when to take this ?additional instructions ?  ? ?  ? ? Contact information for follow-up providers   ? ? [name], MD. Schedule an appointment as soon as possible for a visit in 10 day(s).   ?Specialty: Internal Medicine ?Contact information: ?[address] ?[place] [address] ?[phone] ? ? ?  ?  ? ? Satira Sark, MD .   ?Specialty: Cardiology ?Contact information: ?[place] ?[place] [address] ?[phone] ? ? ?  ?  ? ?  ?  ? ? Contact information for after-discharge care   ? ? Destination   ? ? [facility] .   ?Service: Skilled Nursing ?Contact information: ?[address] [facility] ?[place] ?  [phone] ? ?  ?  ? ?  ?  ? ?  ?  ? ?  ? ?Discharge Exam: ?Danley Danker Weights  ? 06/08/21 1514  ?Weight: 102.3 kg  ? ?General exam: Alert, awake, oriented x 2; following commands appropriately pleasant and in no acute distress.  Still significantly weak and deconditioned requiring 2+ assist just for transferring. ?Respiratory system: Clear to auscultation. Respiratory effort normal.  No using accessory muscle.  Good saturation on room air. ?Cardiovascular system: Rate controlled, no rubs, no gallops, no JVD on exam. ?Gastrointestinal system: Abdomen is  nondistended, soft and nontender. No organomegaly or masses felt. Normal bowel sounds heard. ?Central nervous system: No focal neurological deficits. ?Extremities: No cyanosis or clubbing. ?Skin: No petechiae. ?Psychiatry:

## 2021-06-13 NOTE — Progress Notes (Signed)
No new issues with patient this shift.  ?

## 2021-06-13 NOTE — Progress Notes (Signed)
Daily Progress Note   Patient Name: Mike Briggs       Date: 06/13/2021 DOB: 03/16/1928  Age: 88 y.o. MRN#: [id number] Attending Physician: Barton Dubois, MD Primary Care Physician: [name], MD Admit Date: 06/08/2021 Length of Stay: 2 days  Reason for Consultation/Follow-up: Establishing goals of care  HPI/Patient Profile:  88 y.o. male  with past medical history of T2DM, hypertension, dementia, CVA and depression who was referred to the ED by his primary care after having a syncope episode at the outpatient clinic. He was admitted on 06/08/2021 with syncope, CKD IIIa, vascular dementia, and others. In the ED a CT didn't show acute findings. Recent hospitalization in March for UTI and December for chest pain, found to have Mobitz type I AV block at that time.   During current hospitalization suffered a vasovagal episode while using the commode on 06/09/21 for which a CODE BLUE was called, no resuscitation required and self-converted back into sinus rhythm. Work-up found no PE and 2D echo reassuring.   PMT was consulted for [place] conversation.  Subjective:   Subjective: Chart Reviewed. Updates received. Patient Assessed. Created space and opportunity for patient  and family to explore thoughts and feelings regarding current medical situation.  Today's Discussion: Today I again saw the patient.  He was sleeping but easily aroused.  He was oriented x2 (person, place) but unaware of the year.  I asked if he recalled our conversation yesterday about CODE STATUS and he stated he did.  I asked him to tell me what we talked about.  He said "if I die let me go" and I confirmed that we did indeed talk about this and he had requested that we allow him to pass peacefully should he have a cardiac arrest.  I asked him if he understood that this could mean that he will die sooner and he confirmed that he does.  He denies any pain or significant symptoms.  He is wanting to go home.  I made attempts to  call his niece which were unsuccessful.  We will continue attempts.  I provided emotional general support through therapeutic listening, empathy, sharing of stories, and other techniques.  I answered all questions and addressed all concerns to the best my ability.  Review of Systems  Constitutional:  Positive for fatigue.  Respiratory:  Negative for cough and shortness of breath.   Cardiovascular:  Negative for chest pain.  Gastrointestinal:  Negative for abdominal pain, nausea and vomiting.   Objective:   Vital Signs:  BP (!) 154/70 (BP Location: Right Arm)   Pulse 98   Temp 98.4 F (36.9 C) (Oral)   Resp 18   Ht 5\' 11"  (1.803 m)   Wt 102.3 kg   SpO2 98%   BMI 31.46 kg/m   Physical Exam: Physical Exam Vitals and nursing note reviewed.  Constitutional:      General: He is sleeping. He is not in acute distress. HENT:     Head: Normocephalic and atraumatic.  Cardiovascular:     Rate and Rhythm: Normal rate.  Pulmonary:     Effort: Pulmonary effort is normal. No respiratory distress.     Breath sounds: No wheezing or rhonchi.  Abdominal:     General: Abdomen is protuberant.     Palpations: Abdomen is soft.  Skin:    General: Skin is warm and dry.  Neurological:     Mental Status: He is easily aroused.     Comments: Oriented x 2  Psychiatric:        Mood and Affect: Mood normal.        Behavior: Behavior normal.  General  Palliative Assessment/Data: 40%   Assessment & Plan:   Impression: Present on Admission:  Syncope  Chronic kidney disease  Syncope and collapse  88 year old male with acute and chronic illnesses now admitted for syncopal episode.  Known Mobitz 1 AV block, not a candidate for permanent pacemaker.  Having nonsustained episodes and no need for rhythm control at this time.  Baseline creatinine 1.0 and he appears to be at baseline today with his chronic kidney disease.  He does have vascular dementia history and appears at baseline.  He is oriented  x3, although I cannot be sure that he understands his current clinical situation.  This is his fourth hospitalization in 6 months and remains at high risk for rehospitalization.  Overall long-term prognosis guarded. Plan appears to be d/c to SNF/rehab. Unable to reach daughter to confirm code status, but patient seems clear on desire for DNR.  SUMMARY OF RECOMMENDATIONS   Given the patient's intermittent confusion, although I feel he has capacity, continue attempts to contact needs to confirm DNR status Further GOC decisions based on patient and family input after contacting niece PMT will continue to follow  Symptom Management:  Per primary team PMT is available to assist as needed  Code Status: Full code  Prognosis: Unable to determine  Discharge Planning: To Be Determined  Discussed with: Medical team, nursing team, patient  Thank you for allowing [place] to participate in the care of Mike Briggs PMT will continue to support holistically.  Time Total: 40 min  Visit consisted of counseling and education dealing with the complex and emotionally intense issues of symptom management and palliative care in the setting of serious and potentially life-threatening illness. Greater than 50%  of this time was spent counseling and coordinating care related to the above assessment and plan.  Walden Field, NP Palliative Medicine Team  Team Phone # [phone] (Nights/Weekends)  09/27/2020, 8:17 AM

## 2021-06-13 NOTE — Progress Notes (Signed)
Report called to [place] at [facility]. IV's removed.  ?

## 2021-06-13 NOTE — TOC Transition Note (Signed)
Transition of Care (TOC) - CM/SW Discharge Note ? ? ?Patient Details  ?Name: Mike Briggs ?MRN: [id number] ?Date of Birth: 1928/02/23 ? ?Transition of Care (TOC) CM/SW Contact:  ?Shade Flood, LCSW ?Phone Number: ?06/13/2021, 3:50 PM ? ? ?Clinical Narrative:    ? ?[facility] has received insurance auth and they can accept pt today. Updated pt's niece, Stanton Kidney. DC clinical sent electronically. RN to call report. EMS arranged. ? ?There are no other TOC needs for dc. ? ?Final next level of care: [place] ?Barriers to Discharge: Barriers Resolved ? ? ?Patient Goals and CMS Choice ?Patient states their goals for this hospitalization and ongoing recovery are:: go home ?CMS Medicare.gov Compare Post Acute Care list provided to:: Patient Represenative (must comment) ?Choice offered to / list presented to :  (niece) ? ?Discharge Placement ?  ?           ?Patient chooses bed at: [facility] ?Patient to be transferred to facility by: EMS ?Name of family member notified: Mary ?Patient and family notified of of transfer: 06/13/21 ? ?Discharge Plan and Services ?In-house Referral: Clinical Social Work ?  ?           ?  ?  ?  ?  ?  ?  ?  ?  ?  ?  ? ?Social Determinants of Health (SDOH) Interventions ?  ? ? ?Readmission Risk Interventions ? ?  06/12/2021  ? 12:19 PM 03/25/2019  ?  4:26 PM  ?Readmission Risk Prevention Plan  ?Transportation Screening Complete Complete  ?[place] or Home Care Consult Complete Complete  ?Social Work Consult for [place] Planning/Counseling Complete Complete  ?Palliative Care Screening Not Applicable Not Applicable  ?Medication Review Press photographer) Complete Complete  ? ? ? ? ? ?

## 2021-06-13 NOTE — TOC Transition Note (Addendum)
Transition of Care (TOC) - CM/SW Discharge Note ? ? ?Patient Details  ?Name: Mike Briggs ?MRN: [id number] ?Date of Birth: 05-08-1928 ? ?Transition of Care (TOC) CM/SW Contact:  ?Shade Flood, LCSW ?Phone Number: ?06/13/2021, 4:42 PM ? ? ?Clinical Narrative:    ? ?Pt's great niece [name] arrived to the hospital stating she is pt's POA and she has paperwork documenting this. She states she will take pt home and take care of him. She does not want the SNF placement. She is agreeable to [facility]. CMS provider options reviewed. Will refer to [facility] as requested. EMS updated and will transport pt to his home. ? ?Final next level of care: [place] ?Barriers to Discharge: Barriers Resolved ? ? ?Patient Goals and CMS Choice ?Patient states their goals for this hospitalization and ongoing recovery are:: go home ?CMS Medicare.gov Compare Post Acute Care list provided to:: Patient Represenative (must comment) ?Choice offered to / list presented to : Adult Children ? ?Discharge Placement ?  ?           ?Patient chooses bed at: [facility] ?Patient to be transferred to facility by: EMS ?Name of family member notified: Mary ?Patient and family notified of of transfer: 06/13/21 ? ?Discharge Plan and Services ?In-house Referral: Clinical Social Work ?  ?           ?  ?  ?  ?  ?  ?  ?  ?  ?  ?  ? ?Social Determinants of Health (SDOH) Interventions ?  ? ? ?Readmission Risk Interventions ? ?  06/12/2021  ? 12:19 PM 03/25/2019  ?  4:26 PM  ?Readmission Risk Prevention Plan  ?Transportation Screening Complete Complete  ?[place] or Home Care Consult Complete Complete  ?Social Work Consult for [place] Planning/Counseling Complete Complete  ?Palliative Care Screening Not Applicable Not Applicable  ?Medication Review Press photographer) Complete Complete  ? ? ? ? ? ?

## 2021-06-13 NOTE — Care Management Important Message (Signed)
Important Message ? ?Patient Details  ?Name: Mike Briggs ?MRN: [id number] ?Date of Birth: 06-Nov-1928 ? ? ?Medicare Important Message Given:  Yes ? ? ? ? ?[name] ?06/13/2021, 11:38 AM ?

## 2021-06-14 ENCOUNTER — Telehealth: Payer: Self-pay

## 2021-06-14 NOTE — Telephone Encounter (Signed)
Transition Care Management Follow-up Telephone Call ?Date of discharge and from where: 06/13/21 [place] ?How have you been since you were released from the hospital? fine ?Any questions or concerns? No ? ?Items Reviewed: ?Did the pt receive and understand the discharge instructions provided? Yes  ?Medications obtained and verified? Yes  ?Other?    ?Any new allergies since your discharge? No  ?Dietary orders reviewed? Yes ?Do you have support at home? Yes  ? ?Home Care and Equipment/Supplies: ?Were home health services ordered? no ?If so, what is the name of the agency? N/a  ?Has the agency set up a time to come to the patient's home? not applicable ?Were any new equipment or medical supplies ordered?  No ?What is the name of the medical supply agency? N/a ?Were you able to get the supplies/equipment? not applicable ?Do you have any questions related to the use of the equipment or supplies? No ? ?Functional Questionnaire: (I = Independent and D = Dependent) ?ADLs: d ? ?Bathing/Dressing- d ? ?Meal Prep- d ? ?Eating- d ? ?Maintaining continence- d ? ?Transferring/Ambulation- d ? ?Managing Meds- d ? ?Follow up appointments reviewed: ? ?PCP Hospital f/u appt confirmed? Yes  Scheduled to see Mike Briggs on 06/16/21 @ 4. ?[facility] f/u appt confirmed? No  ?Are transportation arrangements needed? No  ?If their condition worsens, is the pt aware to call PCP or go to the Emergency Dept.? Yes ?Was the patient provided with contact information for the PCP's office or ED? Yes ?Was to pt encouraged to call back with questions or concerns? Yes  ?

## 2021-06-16 ENCOUNTER — Telehealth: Payer: Self-pay | Admitting: Internal Medicine

## 2021-06-16 NOTE — Telephone Encounter (Signed)
LVM for Santiago Glad to call the office

## 2021-06-16 NOTE — Telephone Encounter (Signed)
Santiago Glad with [facility], called stating she received pt discharge summary last night. States he was advised on the discharge summary to d/c linisipril & amlodipine. States she spoke with pt aid and these 2 meds are still in the pill pack they are giving him. She is unable to get into contact with the pt. Can you please contact Santiago Glad at [phone]?

## 2021-06-16 NOTE — Telephone Encounter (Signed)
Santiago Glad returned call back

## 2021-06-16 NOTE — Telephone Encounter (Signed)
Larrie Kass [phone]) physical therapist with [facility] called in on patient behalf.  Verbal orders for PT   1x week 1 week  2x week 2 weeks  1x week 1 week   Verbal order skilled nursing evalv  Spot on crease of buttocks  Early stage pressure sore  Can leave verbal orders on VM

## 2021-06-16 NOTE — Telephone Encounter (Signed)
Verbal order given  

## 2021-06-19 ENCOUNTER — Other Ambulatory Visit: Payer: Self-pay | Admitting: Internal Medicine

## 2021-06-19 ENCOUNTER — Telehealth: Payer: Self-pay

## 2021-06-19 NOTE — Telephone Encounter (Signed)
Called need med refill  SURE COMFORT PEN NEEDLES 31G X 5 MM MISC   trimethoprim (TRIMPEX) 100 MG tablet    (ONLY HAS 1 LEFT OF THIS)   Pharmacy: [facility]

## 2021-06-19 NOTE — Telephone Encounter (Signed)
Mike Briggs said the medication comes prepackaged with dose pack but medication has not been stopped as aide stated they give him whats in pill pack pt has TOC coming  up please advise and we will let karen know of recommendation

## 2021-06-19 NOTE — Telephone Encounter (Signed)
Pt niece advised with verbal understanding

## 2021-06-19 NOTE — Telephone Encounter (Signed)
Pen needles sent to pharmacy  Trimpex not on med list? Please Arnoldo Hooker

## 2021-06-20 ENCOUNTER — Telehealth: Payer: Self-pay | Admitting: *Deleted

## 2021-06-20 ENCOUNTER — Telehealth: Payer: Self-pay | Admitting: Internal Medicine

## 2021-06-20 ENCOUNTER — Telehealth: Payer: Medicare HMO

## 2021-06-20 NOTE — Telephone Encounter (Signed)
Verbal order given  

## 2021-06-20 NOTE — Telephone Encounter (Signed)
Mike Briggs [facility] [phone]) Called in on patient behalf  for verbal orders   Patient has stage 2 pressure sore on R buttocks   Wants to use xeroform / foam 2 x weekly

## 2021-06-20 NOTE — Telephone Encounter (Signed)
Mike Briggs advised with advise family when nurse goes out to home

## 2021-06-20 NOTE — Telephone Encounter (Signed)
  Care Management   Follow Up Note   06/20/2021 Name: Mike Briggs MRN: [id number] DOB: 07/17/1928   Referred by: [name], MD Reason for referral : Chronic Care Management (DM2, HTN)   An unsuccessful telephone outreach was attempted today. The patient was referred to the case management team for assistance with care management and care coordination.   Follow Up Plan: Telephone follow up appointment with care management team member scheduled for: upon care guide rescheduling.  Jacqlyn Larsen [facility], BSN RN Case Manager [facility] [phone]

## 2021-06-23 ENCOUNTER — Inpatient Hospital Stay: Payer: Medicare HMO | Admitting: Nurse Practitioner

## 2021-06-23 ENCOUNTER — Telehealth: Payer: Medicare HMO

## 2021-06-28 ENCOUNTER — Ambulatory Visit: Payer: Medicare HMO | Admitting: Family Medicine

## 2021-06-28 DIAGNOSIS — M6281 Muscle weakness (generalized): Secondary | ICD-10-CM | POA: Diagnosis not present

## 2021-06-28 DIAGNOSIS — F0150 Vascular dementia without behavioral disturbance: Secondary | ICD-10-CM

## 2021-06-28 DIAGNOSIS — M1990 Unspecified osteoarthritis, unspecified site: Secondary | ICD-10-CM

## 2021-06-28 DIAGNOSIS — L89152 Pressure ulcer of sacral region, stage 2: Secondary | ICD-10-CM | POA: Diagnosis not present

## 2021-06-28 DIAGNOSIS — I69398 Other sequelae of cerebral infarction: Secondary | ICD-10-CM | POA: Diagnosis not present

## 2021-06-28 DIAGNOSIS — E559 Vitamin D deficiency, unspecified: Secondary | ICD-10-CM

## 2021-06-28 DIAGNOSIS — I083 Combined rheumatic disorders of mitral, aortic and tricuspid valves: Secondary | ICD-10-CM

## 2021-06-28 DIAGNOSIS — D631 Anemia in chronic kidney disease: Secondary | ICD-10-CM

## 2021-06-28 DIAGNOSIS — I129 Hypertensive chronic kidney disease with stage 1 through stage 4 chronic kidney disease, or unspecified chronic kidney disease: Secondary | ICD-10-CM | POA: Diagnosis not present

## 2021-06-28 DIAGNOSIS — F32A Depression, unspecified: Secondary | ICD-10-CM

## 2021-06-28 DIAGNOSIS — I441 Atrioventricular block, second degree: Secondary | ICD-10-CM

## 2021-06-28 DIAGNOSIS — E1122 Type 2 diabetes mellitus with diabetic chronic kidney disease: Secondary | ICD-10-CM

## 2021-06-28 DIAGNOSIS — R1310 Dysphagia, unspecified: Secondary | ICD-10-CM

## 2021-06-28 DIAGNOSIS — E785 Hyperlipidemia, unspecified: Secondary | ICD-10-CM

## 2021-06-28 DIAGNOSIS — E669 Obesity, unspecified: Secondary | ICD-10-CM

## 2021-06-28 DIAGNOSIS — N1831 Chronic kidney disease, stage 3a: Secondary | ICD-10-CM

## 2021-06-28 DIAGNOSIS — M1030 Gout due to renal impairment, unspecified site: Secondary | ICD-10-CM

## 2021-06-30 ENCOUNTER — Ambulatory Visit: Payer: Medicare HMO | Admitting: Family Medicine

## 2021-07-02 DIAGNOSIS — R69 Illness, unspecified: Secondary | ICD-10-CM | POA: Diagnosis not present

## 2021-07-02 DIAGNOSIS — R5381 Other malaise: Secondary | ICD-10-CM | POA: Diagnosis not present

## 2021-07-04 ENCOUNTER — Ambulatory Visit (INDEPENDENT_AMBULATORY_CARE_PROVIDER_SITE_OTHER): Payer: Medicare HMO | Admitting: Family Medicine

## 2021-07-04 ENCOUNTER — Encounter: Payer: Self-pay | Admitting: Family Medicine

## 2021-07-04 DIAGNOSIS — R55 Syncope and collapse: Secondary | ICD-10-CM | POA: Diagnosis not present

## 2021-07-04 DIAGNOSIS — L89301 Pressure ulcer of unspecified buttock, stage 1: Secondary | ICD-10-CM

## 2021-07-04 DIAGNOSIS — F0390 Unspecified dementia without behavioral disturbance: Secondary | ICD-10-CM

## 2021-07-04 MED ORDER — UNABLE TO FIND: 0 refills | Status: AC

## 2021-07-04 NOTE — Assessment & Plan Note (Signed)
-  CNA reports that the patient has been taking his Namenda twice daily -Inform the patient's CNA that [place] is to be taken daily, not twice daily

## 2021-07-04 NOTE — Assessment & Plan Note (Signed)
-  resolved -no complaints or concerns today -no recent episodes of LOC/ syncope -completed PT today -pending BMP -orders placed for doughnut pillow -labs and imaging reviewed

## 2021-07-04 NOTE — Progress Notes (Addendum)
Established Patient Office Visit  Subjective:  Patient ID: Mike Briggs, male    DOB: [birth year]  Age: 88 y.o. MRN: [id number]  CC:  Chief Complaint  Patient presents with   Follow-up    Following up from hospital visit from 06/08/2021. Has questions about medication memantine (namenda), would like an order for a doughnut pillow for his bottom.     HPI Mike Briggs is a 88 y.o. male with past medical history of T2DM, hypertension, dementia, CVA, and depression presents for hospital follow-up. He was referred to the ED by his primary care after having a syncope episode at the outpatient clinic on 06/08/21. When seen in the ED, the patient could not remember the events; he had no focal abnormalities but was noted to have severe weakness preventing him from standing. His labs and imaging studies were unremarkable, and he was placed on observation for the duration of his stay. Given that the patient lives alone and is a high fall risk, the patient's family suggested that the patient be discharged home with home health services. At today's visit, the patient is alert and oriented and responds to verbal stimuli. He is accompanied by his CNA, who reports the patient completed PT today and wants a doughnut pillow for his bottom. The primary historian for his ROS is his CNA. Past Medical History:  Diagnosis Date   Acute cystitis 12/17/2018   Acute encephalopathy 34/28/7681   Acute metabolic encephalopathy 1/57/2620   ANKLE, ARTHRITIS, DEGEN./OSTEO 12/16/2008   Qualifier: Diagnosis of  By: Aline Brochure MD, [place]     Arthritis    Cataract    Chronic kidney disease    COVID-19    COVID-19 virus detected 11/03/2018   COVID-19 virus infection 11/03/2018   Dehydration 11/02/2018   Depression    Diabetes mellitus    Gram-positive bacteremia 11/03/2018   Hyperlipidemia    Hypertension    Hypomagnesemia 03/24/2019   Klebsiella UTI  03/25/2019   Pain in finger of right hand 05/27/2019   Personal  history of gout 05/22/2016   Rhabdomyolysis 11/02/2018   Sepsis secondary to UTI ([place]) 03/13/2021   Stroke ([place])    Swelling of both hands 05/27/2019   Syncope 02/25/2018   TIA (transient ischemic attack) 11/14/2018   UTI (urinary tract infection) 02/26/2018   Vitamin D deficiency 05/31/2016    Past Surgical History:  Procedure Laterality Date   BACK SURGERY     cervical and lumbar   CATARACT EXTRACTION     CHOLECYSTECTOMY     FOOT SURGERY     5th toe    SPINE SURGERY     lumbar and cervical fusions    Family History  Family history unknown: Yes    Social History   Socioeconomic History   Marital status: Divorced    Spouse name: Not on file   Number of children: 3   Years of education: 10   Highest education level: Not on file  Occupational History   Occupation: Retired.  Tobacco Use   Smoking status: Former    Types: Cigarettes    Quit date: 08/07/1967    Years since quitting: 53.9   Smokeless tobacco: Never  Vaping Use   Vaping Use: Never used  Substance and Sexual Activity   Alcohol use: No   Drug use: No   Sexual activity: Not Currently  Other Topics Concern   Not on file  Social History Narrative   Wheelchair bound.Has not walked in many years, at least  five years.  Lives alone. Has 2 aides that come in to help him.   Lives alone.    Retired.   Eats all food groups.    Has 3 children but does not have contact with them.   Divorced.   Social Determinants of Health   Financial Resource Strain: Low Risk    Difficulty of Paying Living Expenses: Not hard at all  Food Insecurity: No Food Insecurity   Worried About Charity fundraiser in the Last Year: Never true   [place] in the Last Year: Never true  Transportation Needs: No Transportation Needs   Lack of Transportation (Medical): No   Lack of Transportation (Non-Medical): No  Physical Activity: Inactive   Days of Exercise per Week: 0 days   Minutes of Exercise per Session: 0 min  Stress: Stress  Concern Present   Feeling of Stress : To some extent  Social Connections: Socially Isolated   Frequency of Communication with Friends and Family: Never   Frequency of Social Gatherings with Friends and Family: Once a week   Attends Religious Services: Never   Marine scientist or Organizations: No   Attends Music therapist: Never   Marital Status: Divorced  Human resources officer Violence: Not At Risk   Fear of Current or Ex-Partner: No   Emotionally Abused: No   Physically Abused: No   Sexually Abused: No    Outpatient Medications Prior to Visit  Medication Sig Dispense Refill   acetaminophen (TYLENOL) 325 MG tablet Take 2 tablets (650 mg total) by mouth every 6 (six) hours as needed for mild pain or headache (or Fever >/= 101).     aspirin 81 MG chewable tablet Chew 1 tablet (81 mg total) by mouth daily with breakfast. 30 tablet 3   atorvastatin (LIPITOR) 10 MG tablet TAKE 1 TABLET BY MOUTH AFTER SUPPER. (Patient taking differently: Take 10 mg by mouth daily.) 90 tablet 1   blood glucose meter kit and supplies Dispense based on patient and insurance preference. Use up to four times daily as directed. (FOR ICD-10 E10.9, E11.9). 1 each 0   diclofenac Sodium (VOLTAREN) 1 % GEL Apply 4 g topically 3 (three) times daily as needed (arthritic wrist pain). 100 g 0   LANTUS SOLOSTAR 100 UNIT/ML Solostar Pen Inject 20 Units into the skin daily. 15 mL 3   memantine (NAMENDA) 10 MG tablet Take 1 tablet (10 mg total) by mouth daily.     pantoprazole (PROTONIX) 40 MG tablet TAKE ONE TABLET BY MOUTH ONCE DAILY. (Patient taking differently: Take 40 mg by mouth daily.) 90 tablet 3   polyethylene glycol (MIRALAX / GLYCOLAX) 17 g packet Take 17 g by mouth daily. 14 each 0   SURE COMFORT PEN NEEDLES 31G X 5 MM MISC USE TO INJECT AS DIRECTED FOUR TIMES A DAY. WITH MEALS AND BEFORE BEDTIME. 100 each 0   tamsulosin (FLOMAX) 0.4 MG CAPS capsule TAKE 1 CAPSULE BY MOUTH DAILY AFTER BREAKFAST. 30  capsule 11   Ensure Max Protein (ENSURE MAX PROTEIN) LIQD Take 330 mLs (11 oz total) by mouth daily. (Patient not taking: Reported on 07/04/2021)     senna-docusate (SENOKOT-S) 8.6-50 MG tablet Take 2 tablets by mouth at bedtime. (Patient not taking: Reported on 07/04/2021)     No facility-administered medications prior to visit.    No Known Allergies  ROS Review of Systems  Constitutional:  Negative for chills and fever.  Eyes:  Negative for pain  and redness.  Respiratory:  Negative for chest tightness and shortness of breath.   Endocrine: Negative for polydipsia, polyphagia and polyuria.  Genitourinary:  Negative for enuresis and frequency.  Skin:  Positive for rash (on his bottom).  Neurological:  Negative for dizziness and headaches.     Objective:    Physical Exam HENT:     Head: Normocephalic.  Cardiovascular:     Rate and Rhythm: Normal rate and regular rhythm.     Pulses: Normal pulses.  Pulmonary:     Effort: Pulmonary effort is normal.     Breath sounds: Normal breath sounds.  Skin:    Findings: Rash (unable to assess due to weakness in the legs and not being able to stand) present.  Neurological:     General: No focal deficit present.     Mental Status: He is alert and oriented to person, place, and time.     GCS: GCS eye subscore is 4. GCS verbal subscore is 5. GCS motor subscore is 6.     Cranial Nerves: No cranial nerve deficit or facial asymmetry.     Motor: Weakness (lower extremities) present.    BP 116/68   Pulse 61   SpO2 98%  Wt Readings from Last 3 Encounters:  06/08/21 225 lb 8.5 oz (102.3 kg)  04/01/21 225 lb 8.5 oz (102.3 kg)  03/15/21 225 lb 8.5 oz (102.3 kg)    Lab Results  Component Value Date   TSH 0.381 06/09/2021   Lab Results  Component Value Date   WBC 10.9 (H) 06/11/2021   HGB 9.0 (L) 06/11/2021   HCT 28.8 (L) 06/11/2021   MCV 86.5 06/11/2021   PLT PLATELET CLUMPS NOTED ON SMEAR, UNABLE TO ESTIMATE 06/11/2021   Lab Results   Component Value Date   NA 136 06/12/2021   K 4.1 06/12/2021   CO2 23 06/12/2021   GLUCOSE 100 (H) 06/12/2021   BUN 12 06/12/2021   CREATININE 1.02 06/12/2021   BILITOT 0.6 06/08/2021   ALKPHOS 117 06/08/2021   AST 12 (L) 06/08/2021   ALT 6 06/08/2021   PROT 6.6 06/08/2021   ALBUMIN 2.6 (L) 06/08/2021   CALCIUM 7.5 (L) 06/12/2021   ANIONGAP 3 (L) 06/12/2021   EGFR 60 03/21/2021   Lab Results  Component Value Date   CHOL 138 12/06/2020   Lab Results  Component Value Date   HDL 37 (L) 12/06/2020   Lab Results  Component Value Date   LDLCALC 83 12/06/2020   Lab Results  Component Value Date   TRIG 93 12/06/2020   Lab Results  Component Value Date   CHOLHDL 3.7 12/06/2020   Lab Results  Component Value Date   HGBA1C 5.8 (H) 03/13/2021      Assessment & Plan:   Problem List Items Addressed This Visit       Nervous and Auditory   Dementia without behavioral disturbance ([place])    -CNA reports that the patient has been taking his Namenda twice daily -Inform the patient's CNA that Namenda is to be taken daily, not twice daily         Other   Pressure injury of skin   Relevant Medications   UNABLE TO FIND   Syncope   Relevant Orders   Basic Metabolic Panel (BMET)   Syncope and collapse    -resolved -no complaints or concerns today -no recent episodes of LOC/ syncope -completed PT today -pending BMP -orders placed for doughnut pillow -labs and imaging reviewed  Meds ordered this encounter  Medications   UNABLE TO FIND    Sig: Med Name: 1 doughnut pillow to prevent pressure ulcer on his bottom    Dispense:  1 each    Refill:  0    Follow-up: No follow-ups on file.    Alvira Monday, FNP

## 2021-07-05 LAB — BASIC METABOLIC PANEL
BUN/Creatinine Ratio: 14 (ref 10–24)
BUN: 15 mg/dL (ref 10–36)
CO2: 22 mmol/L (ref 20–29)
Calcium: 8.6 mg/dL (ref 8.6–10.2)
Chloride: 102 mmol/L (ref 96–106)
Creatinine, Ser: 1.08 mg/dL (ref 0.76–1.27)
Glucose: 122 mg/dL — ABNORMAL HIGH (ref 70–99)
Potassium: 4.1 mmol/L (ref 3.5–5.2)
Sodium: 139 mmol/L (ref 134–144)
eGFR: 65 mL/min/{1.73_m2} (ref >59)

## 2021-07-11 NOTE — Progress Notes (Signed)
Please inform the patient that his labs are within normal limits

## 2021-07-27 ENCOUNTER — Other Ambulatory Visit: Payer: Self-pay | Admitting: *Deleted

## 2021-07-27 DIAGNOSIS — I10 Essential (primary) hypertension: Secondary | ICD-10-CM

## 2021-08-17 ENCOUNTER — Telehealth: Payer: Medicare HMO

## 2021-08-18 ENCOUNTER — Encounter (HOSPITAL_COMMUNITY): Payer: Self-pay

## 2021-08-18 ENCOUNTER — Emergency Department (HOSPITAL_COMMUNITY): Payer: Medicare HMO

## 2021-08-18 ENCOUNTER — Emergency Department (HOSPITAL_COMMUNITY)
Admission: EM | Admit: 2021-08-18 | Discharge: 2021-08-18 | Disposition: A | Discharge status: Home / Self Care | Payer: Medicare HMO | Attending: Emergency Medicine | Admitting: Emergency Medicine

## 2021-08-18 DIAGNOSIS — R000 Tachycardia, unspecified: Secondary | ICD-10-CM | POA: Diagnosis not present

## 2021-08-18 DIAGNOSIS — Z7982 Long term (current) use of aspirin: Secondary | ICD-10-CM | POA: Diagnosis not present

## 2021-08-18 DIAGNOSIS — R0989 Other specified symptoms and signs involving the circulatory and respiratory systems: Secondary | ICD-10-CM | POA: Diagnosis not present

## 2021-08-18 DIAGNOSIS — R0981 Nasal congestion: Secondary | ICD-10-CM | POA: Insufficient documentation

## 2021-08-18 DIAGNOSIS — R5381 Other malaise: Secondary | ICD-10-CM | POA: Diagnosis not present

## 2021-08-18 DIAGNOSIS — R059 Cough, unspecified: Secondary | ICD-10-CM | POA: Diagnosis not present

## 2021-08-18 NOTE — Discharge Instructions (Signed)
Patient given a flutter valve which he may use to breathe into for several minutes.  He breathe in and out this can help the patient break up secretions in his chest to clear them.  His chest x-ray shows no evidence of pneumonia. Patient's bottom shows no evidence of infection.

## 2021-08-18 NOTE — ED Provider Notes (Signed)
[facility] EMERGENCY DEPARTMENT Provider Note   CSN: [id number] Arrival date & time: 08/18/21  1202     History  Chief Complaint  Patient presents with   Nasal Congestion    Mike Briggs is a 88 y.o. male who was sent to the emergency department for by his CNA for bottom pain and cough trouble clearing his secretions.  Patient states that he does not know why he is here and has no complaints.  He does admit that he is having some trouble coughing up his secretions this morning.  According to his nurse who spoke with patient's CNA patient was coughing but could not clear secretions today.  He has not had a fever or chills.  Patient denies any pain in his bottom after having his extremely soiled diaper changed here.  He has no evidence of pressure ulcers at this time.  HPI     Home Medications Prior to Admission medications   Medication Sig Start Date End Date Taking? Authorizing Provider  acetaminophen (TYLENOL) 325 MG tablet Take 2 tablets (650 mg total) by mouth every 6 (six) hours as needed for mild pain or headache (or Fever >/= 101). 06/13/21   Barton Dubois, MD  aspirin 81 MG chewable tablet Chew 1 tablet (81 mg total) by mouth daily with breakfast. 12/04/19   Perlie Mayo, NP  atorvastatin (LIPITOR) 10 MG tablet TAKE 1 TABLET BY MOUTH AFTER SUPPER. Patient taking differently: Take 10 mg by mouth daily. 03/28/21   [name], MD  blood glucose meter kit and supplies Dispense based on patient and insurance preference. Use up to four times daily as directed. (FOR ICD-10 E10.9, E11.9). 04/05/20   [name], MD  diclofenac Sodium (VOLTAREN) 1 % GEL Apply 4 g topically 3 (three) times daily as needed (arthritic wrist pain). 04/04/21   Barton Dubois, MD  Ensure Max Protein (ENSURE MAX PROTEIN) LIQD Take 330 mLs (11 oz total) by mouth daily. Patient not taking: Reported on 07/04/2021 06/14/21   Barton Dubois, MD  LANTUS SOLOSTAR 100 UNIT/ML Solostar Pen Inject 20 Units into  the skin daily. 10/10/20   [name], MD  memantine (NAMENDA) 10 MG tablet Take 1 tablet (10 mg total) by mouth daily. 06/13/21   Barton Dubois, MD  pantoprazole (PROTONIX) 40 MG tablet TAKE ONE TABLET BY MOUTH ONCE DAILY. Patient taking differently: Take 40 mg by mouth daily. 04/26/21   [name], MD  polyethylene glycol (MIRALAX / GLYCOLAX) 17 g packet Take 17 g by mouth daily. 06/14/21   Barton Dubois, MD  senna-docusate (SENOKOT-S) 8.6-50 MG tablet Take 2 tablets by mouth at bedtime. Patient not taking: Reported on 07/04/2021 06/13/21   Barton Dubois, MD  SURE COMFORT PEN NEEDLES 31G X 5 MM MISC USE TO INJECT AS DIRECTED FOUR TIMES A DAY. WITH MEALS AND BEFORE BEDTIME. 06/19/21   [name], MD  tamsulosin (FLOMAX) 0.4 MG CAPS capsule TAKE 1 CAPSULE BY MOUTH DAILY AFTER BREAKFAST. 05/26/21   Irine Seal, MD  UNABLE TO FIND Med Name: 1 doughnut pillow to prevent pressure ulcer on his bottom 07/04/21   Alvira Monday, FNP      Allergies    Patient has no known allergies.    Review of Systems   Review of Systems  Physical Exam Updated Vital Signs BP (!) 160/82   Pulse 94   Temp 98.3 F (36.8 C) (Oral)   Resp (!) 21   Ht $R'5\' 11"'Pa$  (1.803 m)  Wt 102.1 kg   SpO2 97%   BMI 31.38 kg/m  Physical Exam Vitals and nursing note reviewed.  Constitutional:      General: He is not in acute distress.    Appearance: He is well-developed. He is not diaphoretic.  HENT:     Head: Normocephalic and atraumatic.  Eyes:     General: No scleral icterus.    Conjunctiva/sclera: Conjunctivae normal.  Cardiovascular:     Rate and Rhythm: Normal rate and regular rhythm.     Heart sounds: Normal heart sounds.  Pulmonary:     Effort: Pulmonary effort is normal. No respiratory distress.     Breath sounds: Normal breath sounds. No wheezing or rhonchi.  Abdominal:     Palpations: Abdomen is soft.     Tenderness: There is no abdominal tenderness.  Musculoskeletal:     Cervical back: Normal  range of motion and neck supple.  Skin:    General: Skin is warm and dry.  Neurological:     Mental Status: He is alert.  Psychiatric:        Behavior: Behavior normal.     ED Results / Procedures / Treatments   Labs (all labs ordered are listed, but only abnormal results are displayed) Labs Reviewed - No data to display  EKG None  Radiology DG Chest [facility] 1 View  Result Date: 08/18/2021 CLINICAL DATA:  Congestion. EXAM: PORTABLE CHEST 1 VIEW COMPARISON:  Jun 09, 2021. FINDINGS: The heart size and mediastinal contours are within normal limits. Left lung is clear. Minimal right basilar subsegmental atelectasis or scarring is noted. The visualized skeletal structures are unremarkable. IMPRESSION: Minimal right basilar subsegmental atelectasis or scarring. Electronically Signed   By: Marijo Conception M.D.   On: 08/18/2021 13:45    Procedures Procedures    Medications Ordered in ED Medications - No data to display  ED Course/ Medical Decision Making/ A&P Clinical Course as of 08/18/21 1410  Fri Aug 18, 2021  1352 DG Chest [place] 1 View I visualized interpreted 1 view chest x-ray, no signs of infiltrates or consolidation.  Agree with radiologic interpretation [AH]    Clinical Course User Index [AH] Margarita Mail, PA-C                           Medical Decision Making Patient's chest x-ray negative for any acute abnormalities.  He has no other complaints.  Patient given a flutter valve which may help him break up any secretions which may increase his ability to perform pulmonary toilet.  Patient has no other evidence of skin infection, appears otherwise appropriate for discharge at this time.  Discussed with attending physician who agrees with work-up and plan for discharge  Amount and/or Complexity of Data Reviewed Radiology: ordered. Decision-making details documented in ED Course.           Final Clinical Impression(s) / ED Diagnoses Final diagnoses:  None    Rx  / DC Orders ED Discharge Orders     None         Margarita Mail, PA-C 56/38/75 [id number]    Gray, [place] P, DO 29/51/88 2358

## 2021-08-18 NOTE — ED Triage Notes (Signed)
Pt BIBA from home. Pt's caregiver states that pt is congested and cannot cough up anything. Pt also c/o pain on his bottom, pt is in a very wet/soiled diaper.

## 2021-08-18 NOTE — ED Notes (Signed)
[facility] called for ride back home per caregiver.

## 2021-08-18 NOTE — ED Notes (Signed)
Provided pt water to drink.

## 2021-08-18 NOTE — ED Notes (Signed)
Caregiver at bedside

## 2021-08-18 NOTE — ED Notes (Signed)
Provided pt with flutter valve. Encouraged home usage.

## 2021-08-30 ENCOUNTER — Encounter: Payer: Self-pay | Admitting: *Deleted

## 2021-08-30 ENCOUNTER — Ambulatory Visit (INDEPENDENT_AMBULATORY_CARE_PROVIDER_SITE_OTHER): Payer: Medicare HMO | Admitting: *Deleted

## 2021-08-30 DIAGNOSIS — N183 Chronic kidney disease, stage 3 unspecified: Secondary | ICD-10-CM

## 2021-08-30 DIAGNOSIS — I10 Essential (primary) hypertension: Secondary | ICD-10-CM

## 2021-08-30 NOTE — Chronic Care Management (AMB) (Signed)
Chronic Care Management   CCM RN Visit Note  08/30/2021 Name: Mike Briggs MRN: [id number] DOB: 1928-11-19  Subjective: Mike Briggs is a 88 y.o. year old male who is a primary care patient of [name], MD. The care management team was consulted for assistance with disease management and care coordination needs.    Engaged with patient by telephone for follow up visit in response to provider referral for case management and/or care coordination services.   Consent to Services:  The patient was given information about Chronic Care Management services, agreed to services, and gave verbal consent prior to initiation of services.  Please see initial visit note for detailed documentation.   Patient agreed to services and verbal consent obtained.   Assessment: Review of patient past medical history, allergies, medications, health status, including review of consultants reports, laboratory and other test data, was performed as part of comprehensive evaluation and provision of chronic care management services.   SDOH (Social Determinants of Health) assessments and interventions performed:    CCM Care Plan  No Known Allergies  Outpatient Encounter Medications as of 08/30/2021  Medication Sig Note   acetaminophen (TYLENOL) 325 MG tablet Take 2 tablets (650 mg total) by mouth every 6 (six) hours as needed for mild pain or headache (or Fever >/= 101).    aspirin 81 MG chewable tablet Chew 1 tablet (81 mg total) by mouth daily with breakfast.    atorvastatin (LIPITOR) 10 MG tablet TAKE 1 TABLET BY MOUTH AFTER SUPPER. (Patient taking differently: Take 10 mg by mouth daily.) 04/03/2021: 03/28/2021 10 MG TABS (disp 30, 30d supply)    blood glucose meter kit and supplies Dispense based on patient and insurance preference. Use up to four times daily as directed. (FOR ICD-10 E10.9, E11.9).    diclofenac Sodium (VOLTAREN) 1 % GEL Apply 4 g topically 3 (three) times daily as needed (arthritic  wrist pain).    LANTUS SOLOSTAR 100 UNIT/ML Solostar Pen Inject 20 Units into the skin daily.    memantine (NAMENDA) 10 MG tablet Take 1 tablet (10 mg total) by mouth daily.    pantoprazole (PROTONIX) 40 MG tablet TAKE ONE TABLET BY MOUTH ONCE DAILY. (Patient taking differently: Take 40 mg by mouth daily.)    polyethylene glycol (MIRALAX / GLYCOLAX) 17 g packet Take 17 g by mouth daily.    SURE COMFORT PEN NEEDLES 31G X 5 MM MISC USE TO INJECT AS DIRECTED FOUR TIMES A DAY. WITH MEALS AND BEFORE BEDTIME.    tamsulosin (FLOMAX) 0.4 MG CAPS capsule TAKE 1 CAPSULE BY MOUTH DAILY AFTER BREAKFAST.    UNABLE TO FIND Med Name: 1 doughnut pillow to prevent pressure ulcer on his bottom    Ensure Max Protein (ENSURE MAX PROTEIN) LIQD Take 330 mLs (11 oz total) by mouth daily. (Patient not taking: Reported on 07/04/2021)    senna-docusate (SENOKOT-S) 8.6-50 MG tablet Take 2 tablets by mouth at bedtime. (Patient not taking: Reported on 07/04/2021)    No facility-administered encounter medications on file as of 08/30/2021.    Patient Active Problem List   Diagnosis Date Noted   Syncope and collapse 06/11/2021   Arthritis of hand 06/08/2021   Syncope 06/08/2021   T2DM (type 2 diabetes mellitus) ([place])    Hypertension    Chronic kidney disease    Chronic cystitis 04/25/2021   Hospital discharge follow-up 03/21/2021   Physical deconditioning 03/14/2021   UTI (urinary tract infection) 03/13/2021   Dementia without behavioral disturbance ([place])  03/13/2021   Chronic kidney disease, stage 3b ([place]) 03/13/2021   Encounter for examination following treatment at hospital 03/08/2021   Dysphagia    Gastroesophageal reflux disease    Chest pain 01/26/2021   Foot ulcer ([place]) 11/24/2020   PVD (peripheral vascular disease) ([place]) 11/24/2020   Polyarthritis 12/10/2019   Hydrocele 05/27/2019   Mobitz type 1 second degree atrioventricular block    Sacral decubitus ulcer, stage III ([place]) 11/11/2018   Pressure injury of  skin 11/08/2018   Essential hypertension 05/22/2016   Hemiparesis affecting right side as late effect of cerebrovascular accident (CVA) ([place]) 05/22/2016   BPH (benign prostatic hyperplasia) 05/22/2016   Chronic constipation 05/22/2016   Hyperlipidemia 05/22/2016   Pseudophakia 12/06/2015   Vascular dementia ([place]) 10/23/2012   Type 2 diabetes mellitus with diabetic chronic kidney disease ([place]) 12/16/2008    Conditions to be addressed/monitored:HTN and DMII  Care Plan : RN Care Manager Plan of Care  Updates made by [name], RN since 08/30/2021 12:00 AM     Problem: No plan of care established for management of chronic disease state  (HTN, DM2)   Priority: High     Long-Range Goal: Development of plan of care for chronic disease management  (HTN, DM2)   Start Date: 05/09/2021  Expected End Date: 11/05/2021  Priority: High  Note:   Current Barriers:  Knowledge Deficits related to plan of care for management of HTN and DMII  Chronic Disease Management support and education needs related to HTN and DMII Spoke with [name] (niece, POA) and reports patient lives alone, has CAP Aide that comes in the morning and then again in the evening, pt is alone during the day and has managed well with this, has prefilled med packs, cystitis is resolved and follows up with Dr. [name], CAP aide checks CBG BID and [name] is not sure of the readings, AIC is 5.8, blood pressure is not monitored and pt does not have a blood pressure cuff, [name] reports she has contact number for CAP case worker [name] and will request a blood pressure cuff. Patient does not follow a special diet, receives meals on wheels and aide cooks, has DME such as motorized wheel chair and walker in the home, pt is unable to walk, has Life Alert, LCSW is working with patient.  08/30/21- spoke with patient's niece [name] who reports patient continues to have CAP aide and she prefers Consulting civil engineer call aide for CBG readings,  etc as [name] is not sure what they are, [name] states she will let patient and aide know that RN care manager will call next week 09/07/21. [name] states she continues to grocery shop, pay bills, etc for patient, states patient has medications. RNCM Clinical Goal(s):  Patient will verbalize understanding of plan for management of HTN and DMII as evidenced by patient, caregiver report, review of EHR and  through collaboration with RN Care manager, provider, and care team.   Interventions: 1:1 collaboration with primary care provider regarding development and update of comprehensive plan of care as evidenced by provider attestation and co-signature Inter-disciplinary care team collaboration (see longitudinal plan of care) Evaluation of current treatment plan related to  self management and patient's adherence to plan as established by provider   Diabetes Interventions:  (Status:  New goal. and Goal on track:  Yes.) Long Term Goal Assessed patient's understanding of A1c goal: <7% Provided education to patient about basic DM disease process Reviewed medications with patient and discussed  importance of medication adherence Counseled on importance of regular laboratory monitoring as prescribed Discussed plans with patient for ongoing care management follow up and provided patient with direct contact information for care management team Advised patient, providing education and rationale, to check cbg as prescribed and record, calling primary care provider for findings outside established parameters Review of patient status, including review of consultants reports, relevant laboratory and other test results, and medications completed Reinforced carbohydrate modified diet and food choices Lab Results  Component Value Date   HGBA1C 5.8 (H) 03/13/2021   Hypertension Interventions:  (Status:  New goal. and Goal on track:  Yes.) Long Term Goal Last practice recorded BP readings:  BP Readings from Last 3  Encounters:  04/04/21 (!) 130/58  03/21/21 102/72  03/16/21 (!) 155/91  Most recent eGFR/CrCl:  Lab Results  Component Value Date   EGFR 60 03/21/2021    No components found for: CRCL  Evaluation of current treatment plan related to hypertension self management and patient's adherence to plan as established by provider Reviewed medications with patient and discussed importance of compliance Discussed plans with patient for ongoing care management follow up and provided patient with direct contact information for care management team Discussed complications of poorly controlled blood pressure such as heart disease, stroke, circulatory complications, vision complications, kidney impairment, sexual dysfunction Reinforced importance of adherence to low sodium diet, limiting fast food Ask niece to talk with CAP case manager about obtaining blood pressure cuff for patient and call RN care manager back if any issues with this process   Patient Goals/Self-Care Activities: Take medications as prescribed   Attend all scheduled provider appointments Call pharmacy for medication refills 3-7 days in advance of running out of medications Call provider office for new concerns or questions  Work with the social worker to address care coordination needs and will continue to work with the clinical team to address health care and disease management related needs check blood sugar at prescribed times: twice daily check feet daily for cuts, sores or redness enter blood sugar readings and medication or insulin into daily log take the blood sugar log to all doctor visits take the blood sugar meter to all doctor visits trim toenails straight across drink 6 to 8 glasses of water each day fill half of plate with vegetables prepare main meal at home 3 to 5 days each week read food labels for fat, fiber, carbohydrates and portion size wash and dry feet carefully every day wear comfortable, cotton socks wear  comfortable, well-fitting shoes check blood pressure weekly choose a place to take my blood pressure (home, clinic or office, retail store) write blood pressure results in a log or diary learn about high blood pressure keep a blood pressure log take blood pressure log to all doctor appointments keep all doctor appointments take medications for blood pressure exactly as prescribed eat more whole grains, fruits and vegetables, lean meats and healthy fats Follow low sodium diet- limit fast food and read labels Talk with CAP case manager about obtaining a blood pressure cuff RN care manager will contact patient and CAP aide next week       Plan:Telephone follow up appointment with care management team member scheduled for:  09/07/21  Jacqlyn Larsen [facility], BSN RN Case Manager [facility] [phone]

## 2021-08-30 NOTE — Patient Instructions (Signed)
Visit Information  Thank you for taking time to visit with me today. Please don't hesitate to contact me if I can be of assistance to you before our next scheduled telephone appointment.  Following are the goals we discussed today:  Attend all scheduled provider appointments Call pharmacy for medication refills 3-7 days in advance of running out of medications Call provider office for new concerns or questions  Work with the social worker to address care coordination needs and will continue to work with the clinical team to address health care and disease management related needs check blood sugar at prescribed times: twice daily check feet daily for cuts, sores or redness enter blood sugar readings and medication or insulin into daily log take the blood sugar log to all doctor visits take the blood sugar meter to all doctor visits trim toenails straight across drink 6 to 8 glasses of water each day fill half of plate with vegetables prepare main meal at home 3 to 5 days each week read food labels for fat, fiber, carbohydrates and portion size wash and dry feet carefully every day wear comfortable, cotton socks wear comfortable, well-fitting shoes check blood pressure weekly choose a place to take my blood pressure (home, clinic or office, retail store) write blood pressure results in a log or diary learn about high blood pressure keep a blood pressure log take blood pressure log to all doctor appointments keep all doctor appointments take medications for blood pressure exactly as prescribed eat more whole grains, fruits and vegetables, lean meats and healthy fats Follow low sodium diet- limit fast food and read labels Talk with CAP case manager about obtaining a blood pressure cuff RN care manager will contact patient and CAP aide next week  Our next appointment is by telephone on 09/07/21 at 1230 pm  Please call the care guide team at [phone] if you need to cancel or  reschedule your appointment.   If you are experiencing a Mental Health or [place] or need someone to talk to, please call the Suicide and Crisis Lifeline: 988 call the [place] National Suicide Prevention Lifeline: 575-320-4200 or TTY: 4123178642 TTY 916-088-1082) to talk to a trained counselor call 1-800-273-TALK (toll free, 24 hour hotline) go to [facility] [address] [place] [phone]) call the [place]: [phone] call 911   The patient verbalized understanding of instructions, educational materials, and care plan provided today and DECLINED offer to receive copy of patient instructions, educational materials, and care plan.   Jacqlyn Larsen [facility], BSN RN Case Manager [facility] [phone]

## 2021-08-31 ENCOUNTER — Encounter: Payer: Self-pay | Admitting: Internal Medicine

## 2021-08-31 ENCOUNTER — Telehealth: Payer: Medicare HMO

## 2021-08-31 ENCOUNTER — Ambulatory Visit (INDEPENDENT_AMBULATORY_CARE_PROVIDER_SITE_OTHER): Payer: Medicare HMO | Admitting: Internal Medicine

## 2021-08-31 VITALS — BP 177/81 | HR 63 | Resp 18

## 2021-08-31 DIAGNOSIS — L89152 Pressure ulcer of sacral region, stage 2: Secondary | ICD-10-CM | POA: Diagnosis not present

## 2021-08-31 DIAGNOSIS — I10 Essential (primary) hypertension: Secondary | ICD-10-CM

## 2021-08-31 MED ORDER — ZINC OXIDE 25 % EX OINT: TOPICAL_OINTMENT | CUTANEOUS | 0 refills | Status: AC

## 2021-08-31 MED ORDER — CEPHALEXIN 500 MG PO CAPS: 500.0000 mg | ORAL_CAPSULE | Freq: Three times a day (TID) | ORAL | 0 refills | Status: DC

## 2021-08-31 MED ORDER — AMLODIPINE BESYLATE 10 MG PO TABS: 10.0000 mg | ORAL_TABLET | Freq: Every day | ORAL | 1 refills | Status: AC

## 2021-08-31 NOTE — Assessment & Plan Note (Signed)
BP Readings from Last 1 Encounters:  08/31/21 (!) 177/81   Uncontrolled Was well-controlled with Lisinopril 40 mg QD and Amlodipine 10 mg QD in the past, but were discontinued as his BP was low-normal during recent hospital course Added Amlodipine 10 mg QD Counseled for compliance with the medications

## 2021-08-31 NOTE — Progress Notes (Signed)
Acute Office Visit  Subjective:    Patient ID: Mike Briggs, male    DOB: May 25, 1928, 88 y.o.   MRN: [id number]  Chief Complaint  Patient presents with   Acute Visit    Patient has bed sore on left hip and buttocks. Sore on buttocks has been there for awhile the skin just broke open. The left hip they noticed 08-26-21 and it has opened.     HPI Patient is in today for complaint of redness over sacral area and left hip area, which is chronic.  He had recent skin breakdown and has clear discharge from the area.  Denies any bleeding or puslike discharge currently.  Denies any fever or chills currently.  He has home health aide, who has been helping with repositioning every 2 hours, but he is by himself for the most of the day.  He is bedbound and is not able to reposition himself.  His BP was elevated today.  Of note, he was on amlodipine and lisinopril in the past, which were discontinued in the last hospitalization due to low normal BP.  He reports mild headache, but denies any chest pain or dyspnea currently.  Past Medical History:  Diagnosis Date   Acute cystitis 12/17/2018   Acute encephalopathy 72/62/0355   Acute metabolic encephalopathy 9/74/1638   ANKLE, ARTHRITIS, DEGEN./OSTEO 12/16/2008   Qualifier: Diagnosis of  By: Aline Brochure MD, [place]     Arthritis    Cataract    Chronic kidney disease    COVID-19    COVID-19 virus detected 11/03/2018   COVID-19 virus infection 11/03/2018   Dehydration 11/02/2018   Depression    Diabetes mellitus    Gram-positive bacteremia 11/03/2018   Hyperlipidemia    Hypertension    Hypomagnesemia 03/24/2019   Klebsiella UTI  03/25/2019   Pain in finger of right hand 05/27/2019   Personal history of gout 05/22/2016   Rhabdomyolysis 11/02/2018   Sepsis secondary to UTI ([place]) 03/13/2021   Stroke ([place])    Swelling of both hands 05/27/2019   Syncope 02/25/2018   TIA (transient ischemic attack) 11/14/2018   UTI (urinary tract infection) 02/26/2018    Vitamin D deficiency 05/31/2016    Past Surgical History:  Procedure Laterality Date   BACK SURGERY     cervical and lumbar   CATARACT EXTRACTION     CHOLECYSTECTOMY     FOOT SURGERY     5th toe    SPINE SURGERY     lumbar and cervical fusions    Family History  Family history unknown: Yes    Social History   Socioeconomic History   Marital status: Divorced    Spouse name: Not on file   Number of children: 3   Years of education: 10   Highest education level: Not on file  Occupational History   Occupation: Retired.  Tobacco Use   Smoking status: Former    Types: Cigarettes    Quit date: 08/07/1967    Years since quitting: 54.1   Smokeless tobacco: Never  Vaping Use   Vaping Use: Never used  Substance and Sexual Activity   Alcohol use: No   Drug use: No   Sexual activity: Not Currently  Other Topics Concern   Not on file  Social History Narrative   Wheelchair bound.Has not walked in many years, at least five years.  Lives alone. Has 2 aides that come in to help him.   Lives alone.    Retired.   Eats all  food groups.    Has 3 children but does not have contact with them.   Divorced.   Social Determinants of Health   Financial Resource Strain: Low Risk  (03/02/2021)   Overall Financial Resource Strain (CARDIA)    Difficulty of Paying Living Expenses: Not hard at all  Food Insecurity: No Food Insecurity (05/09/2021)   Hunger Vital Sign    Worried About Running Out of Food in the Last Year: Never true    Ran Out of Food in the Last Year: Never true  Transportation Needs: No Transportation Needs (05/09/2021)   PRAPARE - Hydrologist (Medical): No    Lack of Transportation (Non-Medical): No  Physical Activity: Inactive (04/28/2021)   Exercise Vital Sign    Days of Exercise per Week: 0 days    Minutes of Exercise per Session: 0 min  Stress: Stress Concern Present (04/28/2021)   [place]    Feeling of Stress : To some extent  Social Connections: Socially Isolated (03/02/2021)   Social Connection and Isolation Panel [NHANES]    Frequency of Communication with Friends and Family: Never    Frequency of Social Gatherings with Friends and Family: Once a week    Attends Religious Services: Never    Marine scientist or Organizations: No    Attends Archivist Meetings: Never    Marital Status: Divorced  Human resources officer Violence: Not At Risk (03/02/2021)   Humiliation, Afraid, Rape, and Kick questionnaire    Fear of Current or Ex-Partner: No    Emotionally Abused: No    Physically Abused: No    Sexually Abused: No    Outpatient Medications Prior to Visit  Medication Sig Dispense Refill   acetaminophen (TYLENOL) 325 MG tablet Take 2 tablets (650 mg total) by mouth every 6 (six) hours as needed for mild pain or headache (or Fever >/= 101).     aspirin 81 MG chewable tablet Chew 1 tablet (81 mg total) by mouth daily with breakfast. 30 tablet 3   atorvastatin (LIPITOR) 10 MG tablet TAKE 1 TABLET BY MOUTH AFTER SUPPER. (Patient taking differently: Take 10 mg by mouth daily.) 90 tablet 1   blood glucose meter kit and supplies Dispense based on patient and insurance preference. Use up to four times daily as directed. (FOR ICD-10 E10.9, E11.9). 1 each 0   diclofenac Sodium (VOLTAREN) 1 % GEL Apply 4 g topically 3 (three) times daily as needed (arthritic wrist pain). 100 g 0   Ensure Max Protein (ENSURE MAX PROTEIN) LIQD Take 330 mLs (11 oz total) by mouth daily.     LANTUS SOLOSTAR 100 UNIT/ML Solostar Pen Inject 20 Units into the skin daily. 15 mL 3   memantine (NAMENDA) 10 MG tablet Take 1 tablet (10 mg total) by mouth daily.     pantoprazole (PROTONIX) 40 MG tablet TAKE ONE TABLET BY MOUTH ONCE DAILY. (Patient taking differently: Take 40 mg by mouth daily.) 90 tablet 3   polyethylene glycol (MIRALAX / GLYCOLAX) 17 g packet Take 17 g by mouth daily. 14  each 0   senna-docusate (SENOKOT-S) 8.6-50 MG tablet Take 2 tablets by mouth at bedtime.     SURE COMFORT PEN NEEDLES 31G X 5 MM MISC USE TO INJECT AS DIRECTED FOUR TIMES A DAY. WITH MEALS AND BEFORE BEDTIME. 100 each 0   tamsulosin (FLOMAX) 0.4 MG CAPS capsule TAKE 1 CAPSULE BY MOUTH DAILY AFTER BREAKFAST.  30 capsule 11   UNABLE TO FIND Med Name: 1 doughnut pillow to prevent pressure ulcer on his bottom 1 each 0   No facility-administered medications prior to visit.    No Known Allergies  Review of Systems  Constitutional:  Negative for chills and fever.  HENT:  Negative for congestion and sore throat.   Eyes:  Negative for pain and discharge.  Respiratory:  Negative for cough and shortness of breath.   Cardiovascular:  Negative for chest pain and palpitations.  Gastrointestinal:  Negative for constipation, diarrhea, nausea and vomiting.  Endocrine: Negative for polydipsia and polyuria.  Genitourinary:  Negative for dysuria and hematuria.  Musculoskeletal:  Positive for arthralgias. Negative for neck pain and neck stiffness.  Skin:  Positive for wound. Negative for rash.  Neurological:  Positive for weakness (B/l LE) and numbness (R foot). Negative for dizziness and headaches.  Psychiatric/Behavioral:  Negative for agitation and behavioral problems.        Objective:    Physical Exam Vitals reviewed.  Constitutional:      General: He is not in acute distress.    Appearance: He is obese. He is not diaphoretic.     Comments: In wheelchair  HENT:     Head: Normocephalic and atraumatic.     Nose: Nose normal.     Mouth/Throat:     Mouth: Mucous membranes are moist.  Eyes:     General: No scleral icterus.    Extraocular Movements: Extraocular movements intact.  Cardiovascular:     Rate and Rhythm: Normal rate and regular rhythm.     Heart sounds: Normal heart sounds. No murmur heard. Pulmonary:     Breath sounds: Normal breath sounds. No wheezing or rales.   Musculoskeletal:        General: Swelling (R wrist and hand) present.     Cervical back: Neck supple. No tenderness.     Right lower leg: Edema (2+) present.     Left lower leg: Edema (2+) present.  Skin:    General: Skin is warm.     Comments: Pressure ulcer over sacral area and left buttock area, clear discharge noted   Neurological:     General: No focal deficit present.     Mental Status: He is alert and oriented to person, place, and time.     Sensory: Sensory deficit (R foot) present.     Motor: Weakness (RUE - 1/5, B/l LE - 2/5) present.  Psychiatric:        Mood and Affect: Mood normal.        Behavior: Behavior normal.     BP (!) 177/81 (BP Location: Right Arm, Patient Position: Sitting, Cuff Size: Normal)   Pulse 63   Resp 18   SpO2 93%  Wt Readings from Last 3 Encounters:  08/18/21 225 lb (102.1 kg)  06/08/21 225 lb 8.5 oz (102.3 kg)  04/01/21 225 lb 8.5 oz (102.3 kg)        Assessment & Plan:   Problem List Items Addressed This Visit       Cardiovascular and Mediastinum   Essential hypertension    BP Readings from Last 1 Encounters:  08/31/21 (!) 177/81  Uncontrolled Was well-controlled with Lisinopril 40 mg QD and Amlodipine 10 mg QD in the past, but were discontinued as his BP was low-normal during recent hospital course Added Amlodipine 10 mg QD Counseled for compliance with the medications      Relevant Medications   amLODipine (NORVASC) 10 MG  tablet     Other   Pressure injury of skin - Primary    Needs wound care-referred to home health Keflex for bacterial ppx Zinc oxide dressing Frequent repositioning      Relevant Medications   cephALEXin (KEFLEX) 500 MG capsule   ZINC OXIDE, TOPICAL, 25 % OINT   Other Relevant Orders   Ambulatory referral to [facility] ordered this encounter  Medications   cephALEXin (KEFLEX) 500 MG capsule    Sig: Take 1 capsule (500 mg total) by mouth 3 (three) times daily.    Dispense:  15  capsule    Refill:  0   ZINC OXIDE, TOPICAL, 25 % OINT    Sig: Use over sacral ulcer for dressing.    Dispense:  56.7 g    Refill:  0   amLODipine (NORVASC) 10 MG tablet    Sig: Take 1 tablet (10 mg total) by mouth daily.    Dispense:  90 tablet    Refill:  1    Please deliver to the patient.     [name], MD

## 2021-08-31 NOTE — Assessment & Plan Note (Signed)
Needs wound care-referred to home health Keflex for bacterial ppx Zinc oxide dressing Frequent repositioning

## 2021-08-31 NOTE — Patient Instructions (Signed)
Please apply zinc oxide dressing.  Please start taking cephalexin as prescribed.  Please start taking amlodipine as prescribed.

## 2021-09-05 ENCOUNTER — Ambulatory Visit: Payer: Medicare HMO | Admitting: Internal Medicine

## 2021-09-06 DIAGNOSIS — N1832 Chronic kidney disease, stage 3b: Secondary | ICD-10-CM | POA: Diagnosis not present

## 2021-09-06 DIAGNOSIS — M19079 Primary osteoarthritis, unspecified ankle and foot: Secondary | ICD-10-CM | POA: Diagnosis not present

## 2021-09-06 DIAGNOSIS — S71012D Laceration without foreign body, left hip, subsequent encounter: Secondary | ICD-10-CM | POA: Diagnosis not present

## 2021-09-06 DIAGNOSIS — L89612 Pressure ulcer of right heel, stage 2: Secondary | ICD-10-CM | POA: Diagnosis not present

## 2021-09-06 DIAGNOSIS — E1122 Type 2 diabetes mellitus with diabetic chronic kidney disease: Secondary | ICD-10-CM | POA: Diagnosis not present

## 2021-09-06 DIAGNOSIS — I129 Hypertensive chronic kidney disease with stage 1 through stage 4 chronic kidney disease, or unspecified chronic kidney disease: Secondary | ICD-10-CM | POA: Diagnosis not present

## 2021-09-06 DIAGNOSIS — L89893 Pressure ulcer of other site, stage 3: Secondary | ICD-10-CM | POA: Diagnosis not present

## 2021-09-06 DIAGNOSIS — L89622 Pressure ulcer of left heel, stage 2: Secondary | ICD-10-CM | POA: Diagnosis not present

## 2021-09-06 DIAGNOSIS — L89153 Pressure ulcer of sacral region, stage 3: Secondary | ICD-10-CM | POA: Diagnosis not present

## 2021-09-07 ENCOUNTER — Ambulatory Visit: Payer: Medicare HMO | Admitting: *Deleted

## 2021-09-07 DIAGNOSIS — I10 Essential (primary) hypertension: Secondary | ICD-10-CM

## 2021-09-07 DIAGNOSIS — E1122 Type 2 diabetes mellitus with diabetic chronic kidney disease: Secondary | ICD-10-CM

## 2021-09-07 NOTE — Chronic Care Management (AMB) (Signed)
Chronic Care Management   CCM RN Visit Note  09/07/2021 Name: Mike Briggs MRN: [id number] DOB: [birth year]  Subjective: Mike Briggs is a 88 y.o. year old male who is a primary care patient of Mike Spar, MD. The care management team was consulted for assistance with disease management and care coordination needs.    Engaged with patient by telephone for follow up visit in response to provider referral for case management and/or care coordination services.   Consent to Services:  The patient was given information about Chronic Care Management services, agreed to services, and gave verbal consent prior to initiation of services.  Please see initial visit note for detailed documentation.   Patient agreed to services and verbal consent obtained.   Assessment: Review of patient past medical history, allergies, medications, health status, including review of consultants reports, laboratory and other test data, was performed as part of comprehensive evaluation and provision of chronic care management services.   SDOH (Social Determinants of Health) assessments and interventions performed:    CCM Care Plan  No Known Allergies  Outpatient Encounter Medications as of 09/07/2021  Medication Sig Note   acetaminophen (TYLENOL) 325 MG tablet Take 2 tablets (650 mg total) by mouth every 6 (six) hours as needed for mild pain or headache (or Fever >/= 101).    amLODipine (NORVASC) 10 MG tablet Take 1 tablet (10 mg total) by mouth daily.    aspirin 81 MG chewable tablet Chew 1 tablet (81 mg total) by mouth daily with breakfast.    atorvastatin (LIPITOR) 10 MG tablet TAKE 1 TABLET BY MOUTH AFTER SUPPER. (Patient taking differently: Take 10 mg by mouth daily.) 04/03/2021: 03/28/2021 10 MG TABS (disp 30, 30d supply)    blood glucose meter kit and supplies Dispense based on patient and insurance preference. Use up to four times daily as directed. (FOR ICD-10 E10.9, E11.9).    cephALEXin (KEFLEX)  500 MG capsule Take 1 capsule (500 mg total) by mouth 3 (three) times daily.    diclofenac Sodium (VOLTAREN) 1 % GEL Apply 4 g topically 3 (three) times daily as needed (arthritic wrist pain).    Ensure Max Protein (ENSURE MAX PROTEIN) LIQD Take 330 mLs (11 oz total) by mouth daily.    LANTUS SOLOSTAR 100 UNIT/ML Solostar Pen Inject 20 Units into the skin daily.    memantine (NAMENDA) 10 MG tablet Take 1 tablet (10 mg total) by mouth daily.    pantoprazole (PROTONIX) 40 MG tablet TAKE ONE TABLET BY MOUTH ONCE DAILY. (Patient taking differently: Take 40 mg by mouth daily.)    polyethylene glycol (MIRALAX / GLYCOLAX) 17 g packet Take 17 g by mouth daily.    senna-docusate (SENOKOT-S) 8.6-50 MG tablet Take 2 tablets by mouth at bedtime.    SURE COMFORT PEN NEEDLES 31G X 5 MM MISC USE TO INJECT AS DIRECTED FOUR TIMES A DAY. WITH MEALS AND BEFORE BEDTIME.    tamsulosin (FLOMAX) 0.4 MG CAPS capsule TAKE 1 CAPSULE BY MOUTH DAILY AFTER BREAKFAST.    UNABLE TO FIND Med Name: 1 doughnut pillow to prevent pressure ulcer on his bottom    ZINC OXIDE, TOPICAL, 25 % OINT Use over sacral ulcer for dressing.    No facility-administered encounter medications on file as of 09/07/2021.    Patient Active Problem List   Diagnosis Date Noted   Arthritis of hand 06/08/2021   Syncope 06/08/2021   T2DM (type 2 diabetes mellitus) ([place])    Hypertension  Chronic kidney disease    Chronic cystitis 04/25/2021   Hospital discharge follow-up 03/21/2021   Physical deconditioning 03/14/2021   UTI (urinary tract infection) 03/13/2021   Dementia without behavioral disturbance ([place]) 03/13/2021   Chronic kidney disease, stage 3b ([place]) 03/13/2021   Encounter for examination following treatment at hospital 03/08/2021   Dysphagia    Gastroesophageal reflux disease    Chest pain 01/26/2021   Foot ulcer ([place]) 11/24/2020   PVD (peripheral vascular disease) ([place]) 11/24/2020   Polyarthritis 12/10/2019   Hydrocele 05/27/2019    Mobitz type 1 second degree atrioventricular block    Pressure injury of skin 11/08/2018   Essential hypertension 05/22/2016   Hemiparesis affecting right side as late effect of cerebrovascular accident (CVA) ([place]) 05/22/2016   BPH (benign prostatic hyperplasia) 05/22/2016   Chronic constipation 05/22/2016   Hyperlipidemia 05/22/2016   Pseudophakia 12/06/2015   Vascular dementia ([place]) 10/23/2012   Type 2 diabetes mellitus with diabetic chronic kidney disease ([place]) 12/16/2008    Conditions to be addressed/monitored:HTN and DMII  Care Plan : RN Care Manager Plan of Care  Updates made by Mike Mends, RN since 09/07/2021 12:00 AM     Problem: No plan of care established for management of chronic disease state  (HTN, DM2)   Priority: High     Long-Range Goal: Development of plan of care for chronic disease management  (HTN, DM2)   Start Date: 05/09/2021  Expected End Date: 11/05/2021  Priority: High  Note:   Current Barriers:  Knowledge Deficits related to plan of care for management of HTN and DMII  Chronic Disease Management support and education needs related to HTN and DMII Spoke with Mike Briggs (niece, POA) and reports patient lives alone, has CAP Aide that comes in the morning and then again in the evening, pt is alone during the day and has managed well with this, has prefilled med packs, cystitis is resolved and follows up with Dr. Jeffie Briggs, CAP aide checks CBG BID and Mike Briggs is not sure of the readings, AIC is 5.8, blood pressure is not monitored and pt does not have a blood pressure cuff, Mike Briggs reports she has contact number for CAP case worker Mike Briggs and will request a blood pressure cuff. Patient does not follow a special diet, receives meals on wheels and aide cooks, has DME such as motorized wheel chair and walker in the home, pt is unable to walk, has Mike Alert, Mike Briggs is working with patient.  08/30/21- spoke with patient's niece Mike Briggs POA who reports patient continues  to have CAP aide and she prefers Mike Briggs call aide for CBG readings, etc as Mike Briggs is not sure what they are, Mike Briggs states she will let patient and aide know that RN care manager will call next week 09/07/21. Mike Briggs states she continues to grocery shop, pay bills, etc for patient, states patient has medications. 09/07/21- spoke with CNA Nivea (CAP aide) who reports she checks CBG daily with readings ranging 98-128, pt has all medications and taking as prescribed, blood pressure is being monitored, home health nurse is providing wound care for stage 2 sacral pressure ulcer. RNCM Clinical Goal(s):  Patient will verbalize understanding of plan for management of HTN and DMII as evidenced by patient, caregiver report, review of EHR and  through collaboration with RN Care manager, provider, and care team.   Interventions: 1:1 collaboration with primary care provider regarding development and update of comprehensive plan of care as evidenced by provider attestation and co-signature  Inter-disciplinary care team collaboration (see longitudinal plan of care) Evaluation of current treatment plan related to  self management and patient's adherence to plan as established by provider   Diabetes Interventions:  (Status:  New goal. and Goal on track:  Yes.) Long Term Goal Assessed patient's understanding of A1c goal: <7% Reviewed medications with patient and discussed importance of medication adherence Counseled on importance of regular laboratory monitoring as prescribed Advised patient, providing education and rationale, to check cbg as prescribed and record, calling primary care provider for findings outside established parameters Review of patient status, including review of consultants reports, relevant laboratory and other test results, and medications completed Reviewed carbohydrate modified diet and food choices Reviewed importance of good nutrition and adequate protein for wound healing Reviewed  importance of changing positions  2 hours  Lab Results  Component Value Date   HGBA1C 5.8 (H) 03/13/2021   Hypertension Interventions:  (Status:  New goal. and Goal on track:  Yes.) Long Term Goal Last practice recorded BP readings:  BP Readings from Last 3 Encounters:  04/04/21 (!) 130/58  03/21/21 102/72  03/16/21 (!) 155/91  Most recent eGFR/CrCl:  Lab Results  Component Value Date   EGFR 60 03/21/2021    No components found for: CRCL  Evaluation of current treatment plan related to hypertension self management and patient's adherence to plan as established by provider Reviewed medications with patient and discussed importance of compliance Discussed plans with patient for ongoing care management follow up and provided patient with direct contact information for care management team Discussed complications of poorly controlled blood pressure such as heart disease, stroke, circulatory complications, vision complications, kidney impairment, sexual dysfunction Reinforced importance of adherence to low sodium diet, limiting fast food Reviewed with CAP aide the importance of blood pressure monitoring and keeping a log Reviewed plan of care with aide and there will be a different care manager calling patient going forward  Patient Goals/Self-Care Activities: Take medications as prescribed   Attend all scheduled provider appointments Call pharmacy for medication refills 3-7 days in advance of running out of medications Call provider office for new concerns or questions  Work with the social worker to address care coordination needs and will continue to work with the clinical team to address health care and disease management related needs check blood sugar at prescribed times: twice daily check feet daily for cuts, sores or redness enter blood sugar readings and medication or insulin into daily log take the blood sugar log to all doctor visits take the blood sugar meter to all doctor  visits trim toenails straight across drink 6 to 8 glasses of water each day fill half of plate with vegetables prepare main meal at home 3 to 5 days each week read food labels for fat, fiber, carbohydrates and portion size check blood pressure weekly choose a place to take my blood pressure (home, clinic or office, retail store) write blood pressure results in a log or diary learn about high blood pressure keep a blood pressure log take blood pressure log to all doctor appointments keep all doctor appointments take medications for blood pressure exactly as prescribed eat more whole grains, fruits and vegetables, lean meats and healthy fats Follow low sodium diet- limit fast food and read labels Change positions every 2 hours Continue working with home health for dressing changes Please practice good handwashing with dressing changes Have patient eat as healthy as possible with lean meats, adequate vegetables and fruit There will be a different  care manager to outreach you next month       Plan:Telephone follow up appointment with care management team member scheduled for:  10/19/21 at 9 am  Jacqlyn Larsen [facility], BSN RN Case Manager [facility] [phone]

## 2021-09-07 NOTE — Patient Instructions (Addendum)
Visit Information  Thank you for taking time to visit with me today. Please don't hesitate to contact me if I can be of assistance to you before our next scheduled telephone appointment.  Following are the goals we discussed today:  Take medications as prescribed   Attend all scheduled provider appointments Call pharmacy for medication refills 3-7 days in advance of running out of medications Call provider office for new concerns or questions  Work with the social worker to address care coordination needs and will continue to work with the clinical team to address health care and disease management related needs check blood sugar at prescribed times: twice daily check feet daily for cuts, sores or redness enter blood sugar readings and medication or insulin into daily log take the blood sugar log to all doctor visits take the blood sugar meter to all doctor visits trim toenails straight across drink 6 to 8 glasses of water each day fill half of plate with vegetables prepare main meal at home 3 to 5 days each week read food labels for fat, fiber, carbohydrates and portion size check blood pressure weekly choose a place to take my blood pressure (home, clinic or office, retail store) write blood pressure results in a log or diary learn about high blood pressure keep a blood pressure log take blood pressure log to all doctor appointments keep all doctor appointments take medications for blood pressure exactly as prescribed eat more whole grains, fruits and vegetables, lean meats and healthy fats Follow low sodium diet- limit fast food and read labels Change positions every 2 hours Continue working with home health for dressing changes Please practice good handwashing with dressing changes Have patient eat as healthy as possible with lean meats, adequate vegetables and fruit There will be a different care manager to outreach you next month   Next scheduled appointment 10/19/21 at 9  am  Please call the care guide team at [phone] if you need to cancel or reschedule your appointment.   If you are experiencing a Mental Health or [place] or need someone to talk to, please call the Suicide and Crisis Lifeline: 988 call the [place] National Suicide Prevention Lifeline: 647-387-1756 or TTY: 414-609-0685 TTY 601-487-2384) to talk to a trained counselor call 1-800-273-TALK (toll free, 24 hour hotline) go to [facility] [address] [place] [phone]) call the [place]: [phone] call 911   The patient verbalized understanding of instructions, educational materials, and care plan provided today and DECLINED offer to receive copy of patient instructions, educational materials, and care plan.   Jacqlyn Larsen RNC, BSN RN Case Manager [facility] [phone]   Preventing Pressure Injuries  A pressure injury, sometimes called a bedsore or a pressure ulcer, is an injury to the skin and underlying tissue caused by pressure. A pressure injury can happen when your skin presses against a surface, such as a mattress or wheelchair seat, for too long. The pressure on the blood vessels causes reduced blood flow to your skin. This can eventually cause the skin tissue to die and break down into a wound. Pressure injuries usually develop: Over bony parts of the body, such as the tailbone, shoulders, elbows, hips, and heels. Under medical devices, such as respiratory equipment, stockings, tubes, and splints. How can this condition affect me? Pressure injuries are caused by a lack of blood supply to an area of skin. These injuries begin as a reddened area on the skin and  can become an open sore. They can result from intense pressure over a short period of time or from less pressure over a long period of time. Pressure injuries can vary in severity. They can cause pain, muscle damage, and  infection. What can increase my risk? This condition is more likely to develop in people who: Are in the hospital or an extended care facility. Are bedridden or in a wheelchair. Have an injury or disease that keeps them from: Moving normally. Feeling pain or pressure. Communicating if they feel pain or pressure. Have a condition that: Makes them sleepy or less alert. Causes poor blood flow. Need to wear a medical device. Have poor control of their bladder or bowel functions (incontinence). Have poor nutrition (malnutrition). Have had this condition before. Are of certain ethnicities. People of African American, Latino, or Hispanic descent are at higher risk compared to other ethnic groups. What actions can I take to prevent pressure injuries? Reducing and redistributing pressure Do not lie or sit in one position for a long time. Move or change position: Every hour when out of bed in a chair. Every two hours when in bed. As often as told by your health care provider. Use pillows, wedges, or cushions to redistribute pressure. Ask your health care provider to recommend a mattress, cushions, or pads for you. Use medical devices that do not rub your skin. Tell your health care provider if one of your medical devices is causing pain or irritation. Skin care If you are in the hospital, your health care providers: Will inspect your skin, including areas under or around medical devices, at least twice a day. May recommend that you use certain types of bedding to help prevent pressure injuries. These may include a pad, mattress, or chair cushion that is filled with gel, air, water, or foam. Will evaluate your nutrition and consult a dietitian if needed. Will inspect and change any wound dressings regularly. May help you move into different positions every few hours. Will adjust any medical devices and braces as needed to limit pressure on your skin. Will keep your skin clean and dry. May use  gentle cleansers and skin protectants if you are incontinent. Will moisturize any dry skin. In general, at home: Keep your skin clean and dry. Gently pat your skin dry. Do not rub or massage bony areas of your skin. Moisturize dry skin. Use gentle cleansers and skin protectants routinely if you are incontinent. Check your skin at least once a day for any changes in color and for any new blisters or sores. Make sure to check under and around any medical devices and between skin folds. Have a caregiver do this for you if you are not able.  Lifestyle Be as active as you can every day. Ask your health care provider to suggest safe exercises or activities. Do not abuse drugs or alcohol. Do not use any products that contain nicotine or tobacco, such as cigarettes, e-cigarettes, and chewing tobacco. If you need help quitting, ask your health care provider. General instructions  Take over-the-counter and prescription medicines only as told by your health care provider. Work with your health care provider to manage any chronic health conditions. Eat a healthy diet that includes protein, vitamins, and minerals. Ask your health care provider what types of food you should eat. Drink enough fluid to keep your urine pale yellow. Keep all follow-up visits as told by your health care provider. This is important. Contact a health care provider if you:  Feel or see any changes in your skin. Summary A pressure injury, sometimes called a bedsore or a pressure ulcer, is an injury to the skin and underlying tissue caused by pressure. Do not lie or sit in one position for a long time. Check your skin at least once a day for any changes in color and for any new blisters or sores. Make sure to check under and around any medical devices and between skin folds. Have a caregiver do this for you if you are not able. Eat a healthy diet that includes protein, vitamins, and minerals. Ask your health care provider what  types of food you should eat. This information is not intended to replace advice given to you by your health care provider. Make sure you discuss any questions you have with your health care provider. Document Revised: 11/10/2020 Document Reviewed: 11/10/2020 Elsevier Patient Education  [place].

## 2021-09-21 ENCOUNTER — Ambulatory Visit (INDEPENDENT_AMBULATORY_CARE_PROVIDER_SITE_OTHER): Payer: Medicare HMO | Admitting: Urology

## 2021-09-21 ENCOUNTER — Encounter: Payer: Self-pay | Admitting: Urology

## 2021-09-21 VITALS — BP 147/69 | HR 91

## 2021-09-21 DIAGNOSIS — R339 Retention of urine, unspecified: Secondary | ICD-10-CM

## 2021-09-21 DIAGNOSIS — N3020 Other chronic cystitis without hematuria: Secondary | ICD-10-CM

## 2021-09-21 DIAGNOSIS — R3912 Poor urinary stream: Secondary | ICD-10-CM

## 2021-09-21 DIAGNOSIS — N401 Enlarged prostate with lower urinary tract symptoms: Secondary | ICD-10-CM

## 2021-09-21 DIAGNOSIS — N138 Other obstructive and reflux uropathy: Secondary | ICD-10-CM | POA: Diagnosis not present

## 2021-09-21 NOTE — Progress Notes (Signed)
Subjective: 1. Chronic cystitis   2. BPH with urinary obstruction   3. Incomplete bladder emptying   4. Weak urinary stream    09/21/21: Mike Briggs returns today in f/u for his history of UTI's and UUI with BPH and BOO on tamsulosin.   His Cr was 1.08 on 07/04/21.   He had a Klebsiella UTI on 04/26/21 that was treated and now he appears to be on TMP daily for suppression.  He was given keflex for 5 days on 08/31/21.  He has some mild burning but is otherwise doing well with voiding and his IPSS is 3.    03/23/21: Mike Briggs returns today in f/u for history of UTI's and UUI with BPH with BOO on tamsulosin and finasteride.  He is no longer on the finasteride.  He finished Duricef for a Klebsiella UTI yesterday but the cath UA still has some pyuria, microhematuria and bacteria.   He denies incontinence and feels he is voiding well. His Cr was 1.16 at last check in the hospital on 03/21/21.   08/25/20: Mike Briggs returns today in f/u.  He had an e. Coli infection in 2/22 and was treated with bactrim. His Cr was 1.97 in 1/22.  Today he is reporting dysuria and urgency with UUI and also has burning with a BM.   He has a PVR of 133ml and a cath UA was purulent.   He remains on finasteride and tamsulosin.    01/15/20: Mike Briggs returns today in f/u.  He was placed back on keflex suppression after his last visit for a recurrent UTI but has not been on it for about 2 months. .  He remains on finasteride and tamsulosin.   He had a CT AP on 09/16/19.   His UA today still looks infected and his PVR remains 354ml.   He has dysuria but no hematuria and he doesn't wake too much at night.   09/11/19:Mike Briggs returns today in f/u he has a history of BPH with bOO and has been on tamsulosin.  Finasteride was added at his last visit but he has run out.  He was found to have Klebsiella at his last visit and was given keflex for therapy and now suppression with $RemoveBeforeDE'250mg'hXKOdWuTvXCQOsD$  nightly.   He continues to have cloudy urine and dysuria.  He  has had no hematuria.  His PVR today is 178ml and his IPSS is 29 but he feels like he is voiding ok.  A subsequent cath PVR was 373ml and the urine was cloudy.   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   He had a scrotal US in 2018 that confirmed hydroceles.  He has also had recurrent UTI's with cloudy white urine.  He has some dysuria.  He has some urgency.  He has minimal nocturia but can have enuresis.  He has a weak stream and a sensation of incomplete emptying.  He has been constipation and started a stool softener.  He has had no hematuria.  He has no recollection of any GU surgery.  He has had no stones.   He has been hospitalized for UTI's with the most recent trip to the ER on 06/05/19.  The culture grew citrobacter. He had klebsiella on 03/23/19, 12/17/18, 02/25/18 and 10/08/17.  When the urine starts he can't stop it.  He does have some sputtering of the stream.   He had a CT in  2014 and the bladder was noted to be distented.     IPSS     Row Name 09/21/21 1500         International Prostate Symptom Score   How often have you had the sensation of not emptying your bladder? Less than 1 in 5     How often have you had to urinate less than every two hours? Less than 1 in 5 times     How often have you found you stopped and started again several times when you urinated? Not at All     How often have you found it difficult to postpone urination? Not at All     How often have you had a weak urinary stream? Less than 1 in 5 times     How often have you had to strain to start urination? Not at All     How many times did you typically get up at night to urinate? None     Total IPSS Score 3       Quality of Life due to urinary symptoms   If you were to spend the rest of your life with your urinary condition just the way it is now how would you feel about that? Pleased              ROS:  Review of Systems  HENT:  Positive  for congestion and sore throat.   Eyes:  Positive for blurred vision.  Respiratory:  Positive for cough.   Genitourinary:  Positive for dysuria.  Musculoskeletal:  Positive for joint pain.  Neurological:  Positive for weakness and headaches.  Endo/Heme/Allergies:  Positive for polydipsia.    No Known Allergies  Past Medical History:  Diagnosis Date   Acute cystitis 12/17/2018   Acute encephalopathy 10/21/3005   Acute metabolic encephalopathy 07/21/6331   ANKLE, ARTHRITIS, DEGEN./OSTEO 12/16/2008   Qualifier: Diagnosis of  By: Aline Brochure MD, [place]     Arthritis    Cataract    Chronic kidney disease    COVID-19    COVID-19 virus detected 11/03/2018   COVID-19 virus infection 11/03/2018   Dehydration 11/02/2018   Depression    Diabetes mellitus    Gram-positive bacteremia 11/03/2018   Hyperlipidemia    Hypertension    Hypomagnesemia 03/24/2019   Klebsiella UTI  03/25/2019   Pain in finger of right hand 05/27/2019   Personal history of gout 05/22/2016   Rhabdomyolysis 11/02/2018   Sepsis secondary to UTI ([place]) 03/13/2021   Stroke ([place])    Swelling of both hands 05/27/2019   Syncope 02/25/2018   TIA (transient ischemic attack) 11/14/2018   UTI (urinary tract infection) 02/26/2018   Vitamin D deficiency 05/31/2016    Past Surgical History:  Procedure Laterality Date   BACK SURGERY     cervical and lumbar   CATARACT EXTRACTION     CHOLECYSTECTOMY     FOOT SURGERY     5th toe    SPINE SURGERY     lumbar and cervical fusions    Social History   Socioeconomic History   Marital status: Divorced    Spouse name: Not on file   Number of children: 3   Years of education: 10   Highest education level: Not on file  Occupational History   Occupation: Retired.  Tobacco Use   Smoking status: Former    Types: Cigarettes    Quit date: 08/07/1967    Years since quitting: 54.1   Smokeless tobacco: Never  Vaping Use   Vaping Use: Never used  Substance and Sexual Activity   Alcohol  use: No   Drug use: No   Sexual activity: Not Currently  Other Topics Concern   Not on file  Social History Narrative   Wheelchair bound.Has not walked in many years, at least five years.  Lives alone. Has 2 aides that come in to help him.   Lives alone.    Retired.   Eats all food groups.    Has 3 children but does not have contact with them.   Divorced.   Social Determinants of Health   Financial Resource Strain: Low Risk  (03/02/2021)   Overall Financial Resource Strain (CARDIA)    Difficulty of Paying Living Expenses: Not hard at all  Food Insecurity: No Food Insecurity (05/09/2021)   Hunger Vital Sign    Worried About Running Out of Food in the Last Year: Never true    Ran Out of Food in the Last Year: Never true  Transportation Needs: No Transportation Needs (05/09/2021)   PRAPARE - Hydrologist (Medical): No    Lack of Transportation (Non-Medical): No  Physical Activity: Inactive (04/28/2021)   Exercise Vital Sign    Days of Exercise per Week: 0 days    Minutes of Exercise per Session: 0 min  Stress: Stress Concern Present (04/28/2021)   [place]    Feeling of Stress : To some extent  Social Connections: Socially Isolated (03/02/2021)   Social Connection and Isolation Panel [NHANES]    Frequency of Communication with Friends and Family: Never    Frequency of Social Gatherings with Friends and Family: Once a week    Attends Religious Services: Never    Marine scientist or Organizations: No    Attends Archivist Meetings: Never    Marital Status: Divorced  Human resources officer Violence: Not At Risk (03/02/2021)   Humiliation, Afraid, Rape, and Kick questionnaire    Fear of Current or Ex-Partner: No    Emotionally Abused: No    Physically Abused: No    Sexually Abused: No    Family History  Family history unknown: Yes    Anti-infectives: Anti-infectives (From  admission, onward)    None       Current Outpatient Medications  Medication Sig Dispense Refill   acetaminophen (TYLENOL) 325 MG tablet Take 2 tablets (650 mg total) by mouth every 6 (six) hours as needed for mild pain or headache (or Fever >/= 101).     amLODipine (NORVASC) 10 MG tablet Take 1 tablet (10 mg total) by mouth daily. 90 tablet 1   aspirin 81 MG chewable tablet Chew 1 tablet (81 mg total) by mouth daily with breakfast. 30 tablet 3   atorvastatin (LIPITOR) 10 MG tablet TAKE 1 TABLET BY MOUTH AFTER SUPPER. 30 tablet 0   blood glucose meter kit and supplies Dispense based on patient and insurance preference. Use up to four times daily as directed. (FOR ICD-10 E10.9, E11.9). 1 each 0   diclofenac Sodium (VOLTAREN) 1 % GEL Apply 4 g topically 3 (three) times daily as needed (arthritic wrist pain). 100 g 0   Ensure Max Protein (ENSURE MAX PROTEIN) LIQD Take 330 mLs (11 oz total) by mouth daily.     LANTUS SOLOSTAR 100 UNIT/ML Solostar Pen Inject 20 Units into the skin daily. 15 mL 3   memantine (NAMENDA) 10 MG tablet TAKE (1) TABLET BY  MOUTH TWICE DAILY. 60 tablet 0   pantoprazole (PROTONIX) 40 MG tablet TAKE ONE TABLET BY MOUTH ONCE DAILY. (Patient taking differently: Take 40 mg by mouth daily.) 90 tablet 3   polyethylene glycol (MIRALAX / GLYCOLAX) 17 g packet Take 17 g by mouth daily. 14 each 0   senna-docusate (SENOKOT-S) 8.6-50 MG tablet Take 2 tablets by mouth at bedtime.     SURE COMFORT PEN NEEDLES 31G X 5 MM MISC USE TO INJECT AS DIRECTED FOUR TIMES A DAY. WITH MEALS AND BEFORE BEDTIME. 100 each 0   tamsulosin (FLOMAX) 0.4 MG CAPS capsule TAKE 1 CAPSULE BY MOUTH DAILY AFTER BREAKFAST. 30 capsule 11   trimethoprim (TRIMPEX) 100 MG tablet Take 100 mg by mouth daily.     UNABLE TO FIND Med Name: 1 doughnut pillow to prevent pressure ulcer on his bottom 1 each 0   ZINC OXIDE, TOPICAL, 25 % OINT Use over sacral ulcer for dressing. 56.7 g 0   No current facility-administered  medications for this visit.     Objective: Vital signs in last 24 hours: BP (!) 147/69   Pulse 91   Intake/Output from previous day: No intake/output data recorded. Intake/Output this shift: $RemoveBef'@IOTHISSHIFT'WLDZBtHOnR$ @   Physical Exam  Lab Results:  No results found for this or any previous visit (from the past 24 hour(s)).     BMET No results for input(s): "NA", "K", "CL", "CO2", "GLUCOSE", "BUN", "CREATININE", "CALCIUM" in the last 72 hours.  PT/INR No results for input(s): "LABPROT", "INR" in the last 72 hours. ABG No results for input(s): "PHART", "HCO3" in the last 72 hours.  Invalid input(s): "PCO2", "PO2"  Studies/Results: PVR reviewed.   Assessment/Plan: BPH with BOO . He will remain on tamsulosin but is off of the finasteride.  Chronic cystitis.  He just completed treatment for a UTI and is on TMP with minimal symptoms.  He couldn't get a UA today.  I will just have him return in 3 months.     No orders of the defined types were placed in this encounter.     Orders Placed This Encounter  Procedures   Urine Culture    Aid will bring prior to the visit.    Standing Status:   Future    Standing Expiration Date:   03/24/2022   Urinalysis, Routine w reflex microscopic    Aid will bring in prior    Standing Status:   Future    Standing Expiration Date:   03/24/2022     Return in about 3 months (around 12/22/2021) for with a UA and culture from a week prior. .    CC: Cherly Beach NP    Irine Seal 09/22/2021 [phone]

## 2021-09-22 ENCOUNTER — Other Ambulatory Visit: Payer: Self-pay | Admitting: Internal Medicine

## 2021-09-28 DIAGNOSIS — E1159 Type 2 diabetes mellitus with other circulatory complications: Secondary | ICD-10-CM

## 2021-09-28 DIAGNOSIS — I10 Essential (primary) hypertension: Secondary | ICD-10-CM

## 2021-10-06 ENCOUNTER — Other Ambulatory Visit: Payer: Self-pay

## 2021-10-06 ENCOUNTER — Emergency Department (HOSPITAL_COMMUNITY): Payer: Medicare HMO

## 2021-10-06 ENCOUNTER — Encounter (HOSPITAL_COMMUNITY): Payer: Self-pay | Admitting: Emergency Medicine

## 2021-10-06 ENCOUNTER — Emergency Department (HOSPITAL_COMMUNITY)
Admission: EM | Admit: 2021-10-06 | Discharge: 2021-10-07 | Disposition: A | Discharge status: Home / Self Care | Payer: Medicare HMO | Attending: Emergency Medicine | Admitting: Emergency Medicine

## 2021-10-06 DIAGNOSIS — S71012D Laceration without foreign body, left hip, subsequent encounter: Secondary | ICD-10-CM | POA: Diagnosis not present

## 2021-10-06 DIAGNOSIS — L89622 Pressure ulcer of left heel, stage 2: Secondary | ICD-10-CM | POA: Diagnosis not present

## 2021-10-06 DIAGNOSIS — I10 Essential (primary) hypertension: Secondary | ICD-10-CM | POA: Insufficient documentation

## 2021-10-06 DIAGNOSIS — Z7982 Long term (current) use of aspirin: Secondary | ICD-10-CM | POA: Insufficient documentation

## 2021-10-06 DIAGNOSIS — E860 Dehydration: Secondary | ICD-10-CM

## 2021-10-06 DIAGNOSIS — N1832 Chronic kidney disease, stage 3b: Secondary | ICD-10-CM | POA: Diagnosis not present

## 2021-10-06 DIAGNOSIS — L89159 Pressure ulcer of sacral region, unspecified stage: Secondary | ICD-10-CM

## 2021-10-06 DIAGNOSIS — R4182 Altered mental status, unspecified: Secondary | ICD-10-CM | POA: Diagnosis not present

## 2021-10-06 DIAGNOSIS — I129 Hypertensive chronic kidney disease with stage 1 through stage 4 chronic kidney disease, or unspecified chronic kidney disease: Secondary | ICD-10-CM | POA: Diagnosis not present

## 2021-10-06 DIAGNOSIS — Z794 Long term (current) use of insulin: Secondary | ICD-10-CM | POA: Diagnosis not present

## 2021-10-06 DIAGNOSIS — M25562 Pain in left knee: Secondary | ICD-10-CM | POA: Diagnosis not present

## 2021-10-06 DIAGNOSIS — E119 Type 2 diabetes mellitus without complications: Secondary | ICD-10-CM | POA: Diagnosis not present

## 2021-10-06 DIAGNOSIS — Z79899 Other long term (current) drug therapy: Secondary | ICD-10-CM | POA: Diagnosis not present

## 2021-10-06 DIAGNOSIS — M19079 Primary osteoarthritis, unspecified ankle and foot: Secondary | ICD-10-CM | POA: Diagnosis not present

## 2021-10-06 DIAGNOSIS — L89153 Pressure ulcer of sacral region, stage 3: Secondary | ICD-10-CM | POA: Diagnosis not present

## 2021-10-06 DIAGNOSIS — I700 Atherosclerosis of aorta: Secondary | ICD-10-CM | POA: Diagnosis not present

## 2021-10-06 DIAGNOSIS — E1122 Type 2 diabetes mellitus with diabetic chronic kidney disease: Secondary | ICD-10-CM | POA: Diagnosis not present

## 2021-10-06 DIAGNOSIS — L89612 Pressure ulcer of right heel, stage 2: Secondary | ICD-10-CM | POA: Diagnosis not present

## 2021-10-06 DIAGNOSIS — L89893 Pressure ulcer of other site, stage 3: Secondary | ICD-10-CM | POA: Diagnosis not present

## 2021-10-06 LAB — BASIC METABOLIC PANEL
Anion gap: 7 (ref 5–15)
BUN: 17 mg/dL (ref 8–23)
CO2: 28 mmol/L (ref 22–32)
Calcium: 7.8 mg/dL — ABNORMAL LOW (ref 8.9–10.3)
Chloride: 106 mmol/L (ref 98–111)
Creatinine, Ser: 1.39 mg/dL — ABNORMAL HIGH (ref 0.61–1.24)
GFR, Estimated: 48 mL/min — ABNORMAL LOW (ref >60)
Glucose, Bld: 154 mg/dL — ABNORMAL HIGH (ref 70–99)
Potassium: 3.8 mmol/L (ref 3.5–5.1)
Sodium: 141 mmol/L (ref 135–145)

## 2021-10-06 LAB — CBC
HCT: 30.1 % — ABNORMAL LOW (ref 39.0–52.0)
Hemoglobin: 8.8 g/dL — ABNORMAL LOW (ref 13.0–17.0)
MCH: 27.1 pg (ref 26.0–34.0)
MCHC: 29.2 g/dL — ABNORMAL LOW (ref 30.0–36.0)
MCV: 92.6 fL (ref 80.0–100.0)
Platelets: 522 10*3/uL — ABNORMAL HIGH (ref 150–400)
RBC: 3.25 MIL/uL — ABNORMAL LOW (ref 4.22–5.81)
RDW: 16.1 % — ABNORMAL HIGH (ref 11.5–15.5)
WBC: 10.9 10*3/uL — ABNORMAL HIGH (ref 4.0–10.5)
nRBC: 0 % (ref 0.0–0.2)

## 2021-10-06 MED ORDER — SODIUM CHLORIDE 0.9 % IV BOLUS
500.0000 mL | Freq: Once | INTRAVENOUS | Status: AC
  Administered 2021-10-06: 500 mL via INTRAVENOUS

## 2021-10-06 MED ORDER — CEPHALEXIN 500 MG PO CAPS: 500.0000 mg | ORAL_CAPSULE | Freq: Four times a day (QID) | ORAL | 0 refills | Status: DC

## 2021-10-06 MED ORDER — CEPHALEXIN 500 MG PO CAPS
500.0000 mg | ORAL_CAPSULE | Freq: Once | ORAL | Status: AC
  Administered 2021-10-06: 500 mg via ORAL
  Filled 2021-10-06: qty 1

## 2021-10-06 NOTE — ED Provider Notes (Signed)
[facility] EMERGENCY DEPARTMENT Provider Note   CSN: [id number] Arrival date & time: 10/06/21  1951     History  Chief Complaint  Patient presents with   Knot on leg    Mike Briggs is a 88 y.o. male with past medical history significant for diabetes, hypertension, stroke, arthritis, depression, hyperlipidemia, with known sacral ulcer who presents to the emergency department after EMS was called by patient's caregiver who is new to him with concern for a knot on patient's left lower leg, and questionable altered mental status.  Per EMS report the caregiver was completely new to this patient and unaware of his baseline.  He was unavailable to provide any additional details on his presentation.  On my evaluation the patient he is pleasantly demented but alert and oriented to self and situation, and complains of some pain on the buttock.  HPI     Home Medications Prior to Admission medications   Medication Sig Start Date End Date Taking? Authorizing Provider  cephALEXin (KEFLEX) 500 MG capsule Take 1 capsule (500 mg total) by mouth 4 (four) times daily. 10/06/21  Yes Bonny Egger H, PA-C  acetaminophen (TYLENOL) 325 MG tablet Take 2 tablets (650 mg total) by mouth every 6 (six) hours as needed for mild pain or headache (or Fever >/= 101). 06/13/21   Barton Dubois, MD  amLODipine (NORVASC) 10 MG tablet Take 1 tablet (10 mg total) by mouth daily. 08/31/21   [name], MD  aspirin 81 MG chewable tablet Chew 1 tablet (81 mg total) by mouth daily with breakfast. 12/04/19   Perlie Mayo, NP  atorvastatin (LIPITOR) 10 MG tablet TAKE 1 TABLET BY MOUTH AFTER SUPPER. 09/22/21   [name], MD  blood glucose meter kit and supplies Dispense based on patient and insurance preference. Use up to four times daily as directed. (FOR ICD-10 E10.9, E11.9). 04/05/20   [name], MD  diclofenac Sodium (VOLTAREN) 1 % GEL Apply 4 g topically 3 (three) times daily as needed (arthritic wrist  pain). 04/04/21   Barton Dubois, MD  Ensure Max Protein (ENSURE MAX PROTEIN) LIQD Take 330 mLs (11 oz total) by mouth daily. 06/14/21   Barton Dubois, MD  LANTUS SOLOSTAR 100 UNIT/ML Solostar Pen Inject 20 Units into the skin daily. 10/10/20   [name], MD  memantine (NAMENDA) 10 MG tablet TAKE (1) TABLET BY MOUTH TWICE DAILY. 09/22/21   [name], MD  pantoprazole (PROTONIX) 40 MG tablet TAKE ONE TABLET BY MOUTH ONCE DAILY. Patient taking differently: Take 40 mg by mouth daily. 04/26/21   [name], MD  polyethylene glycol (MIRALAX / GLYCOLAX) 17 g packet Take 17 g by mouth daily. 06/14/21   Barton Dubois, MD  senna-docusate (SENOKOT-S) 8.6-50 MG tablet Take 2 tablets by mouth at bedtime. 06/13/21   Barton Dubois, MD  SURE COMFORT PEN NEEDLES 31G X 5 MM MISC USE TO INJECT AS DIRECTED FOUR TIMES A DAY. WITH MEALS AND BEFORE BEDTIME. 06/19/21   [name], MD  tamsulosin (FLOMAX) 0.4 MG CAPS capsule TAKE 1 CAPSULE BY MOUTH DAILY AFTER BREAKFAST. 05/26/21   Irine Seal, MD  trimethoprim (TRIMPEX) 100 MG tablet Take 100 mg by mouth daily. 09/12/21   [provider]  UNABLE TO FIND Med Name: 1 doughnut pillow to prevent pressure ulcer on his bottom 07/04/21   Alvira Monday, FNP  ZINC OXIDE, TOPICAL, 25 % OINT Use over sacral ulcer for dressing. 08/31/21   Posey Pronto,  Colin Broach, MD      Allergies    Patient has no known allergies.    Review of Systems   Review of Systems  Unable to perform ROS: Dementia    Physical Exam Updated Vital Signs BP (!) 128/97 (BP Location: Left Arm)   Pulse (!) 115   Temp 98.4 F (36.9 C) (Oral)   Resp 17   Ht $R'5\' 11"'wK$  (1.803 m)   Wt 102 kg   SpO2 100%   BMI 31.36 kg/m  Physical Exam Vitals and nursing note reviewed.  Constitutional:      General: He is not in acute distress.    Appearance: Normal appearance.  HENT:     Head: Normocephalic and atraumatic.  Eyes:     General:        Right eye: No discharge.        Left eye: No  discharge.  Cardiovascular:     Rate and Rhythm: Regular rhythm. Tachycardia present.     Comments: Occasional PVC noted on auscultation, but otherwise appropriate rhythm Pulmonary:     Effort: Pulmonary effort is normal. No respiratory distress.     Comments: No accessory breath sounds, no respiratory distress Musculoskeletal:        General: No deformity.     Comments: I examined patient's legs bilaterally and saw no evidence of worrisome wound, cellulitis, abscess.  Patient does have fairly prominent fibular heads bilaterally, slightly more prominent on the left which is possibly what his caretaker may have been referring to.  No evidence of tenderness, ulcer, or other injury to the fibular heads.  Skin:    General: Skin is warm and dry.     Comments: Patient with stage I-II sacral decubitus ulcers with appropriate dressings in place, no significant tracking cellulitis, purulent drainage, or abscesses noted  Neurological:     Mental Status: He is alert and oriented to person, place, and time.  Psychiatric:        Mood and Affect: Mood normal.        Behavior: Behavior normal.     ED Results / Procedures / Treatments   Labs (all labs ordered are listed, but only abnormal results are displayed) Labs Reviewed  CBC - Abnormal; Notable for the following components:      Result Value   WBC 10.9 (*)    RBC 3.25 (*)    Hemoglobin 8.8 (*)    HCT 30.1 (*)    MCHC 29.2 (*)    RDW 16.1 (*)    Platelets 522 (*)    All other components within normal limits  BASIC METABOLIC PANEL - Abnormal; Notable for the following components:   Glucose, Bld 154 (*)    Creatinine, Ser 1.39 (*)    Calcium 7.8 (*)    GFR, Estimated 48 (*)    All other components within normal limits  URINALYSIS, ROUTINE W REFLEX MICROSCOPIC    EKG None  Radiology DG Chest 2 View  Result Date: 10/06/2021 CLINICAL DATA:  Altered mental status. EXAM: CHEST - 2 VIEW COMPARISON:  Chest radiograph dated 08/18/2021.  FINDINGS: Minimal right lung base atelectasis/scarring or pleural thickening versus small effusion similar to prior radiograph. No new consolidation,. No pneumothorax. Top-normal cardiac size. Atherosclerotic calcification of the aortic arch. Degenerative changes of the spine. No acute osseous pathology. IMPRESSION: No significant interval change. Electronically Signed   By: Anner Crete M.D.   On: 10/06/2021 21:59    Procedures Procedures    Medications Ordered  in ED Medications  cephALEXin (KEFLEX) capsule 500 mg (500 mg Oral Given 10/06/21 2317)  sodium chloride 0.9 % bolus 500 mL (500 mLs Intravenous New Bag/Given 10/06/21 2314)    ED Course/ Medical Decision Making/ A&P                           Medical Decision Making Amount and/or Complexity of Data Reviewed Labs: ordered. Radiology: ordered.  Risk Prescription drug management.   This patient is a 88 y.o. male who presents to the ED for concern of possible lower leg infection and questionable altered mental status from home. , this involves an extensive number of treatment options, and is a complaint that carries with it a high risk of complications and morbidity. The emergent differential diagnosis prior to evaluation includes, but is not limited to, cellulitis, abscess, or altered mental status considered stroke, TIA, electrolyte abnormality, glucose abnormality, infection, UTI versus other.   This is not an exhaustive differential.   Past Medical History / Co-morbidities / Social History: diabetes, hypertension, stroke, arthritis, depression, hyperlipidemia, with known sacral ulcer, dementia, advanced age  Additional history: Chart reviewed. Pertinent results include: Reviewed lab work, imaging from emergency department visits, as well as recent PCP visits with evaluation of known sacral decubitus ulcers documented from at least 08/31/2021  Physical Exam: Physical exam performed. The pertinent findings include: On exam  patient is with mild tachycardia, likely secondary to dehydration, complains of some pain of the buttock which is likely secondary to his chronic decubitus ulcer.  Does have some foul smell coming from previous ulcers, evidence of tracking cellulitis.  He is otherwise without any signs of respiratory distress, tenderness palpation of the abdomen, and is pleasantly demented compared to his known baseline.  Lab Tests: I ordered, and personally interpreted labs.  The pertinent results include: CBC notable for mild leukocytosis, white blood cells at 10.9, this is very similar compared to patient's baseline when he is in the emergency department.  Show some signs of hemoconcentration with platelets elevated at 522, and mild worsening of anemia likely secondary to chronic disease, no evidence of acute blood loss, no report of dark tarry stools.  BMP notable for mild elevation of creatinine at 1.39, slight worsening of patient's hypocalcemia with calcium of 7.8 he has had hypocalcemia around with his creatinine bump we will provide some fluids which will hopefully help to improve his kidney function as well as tachycardia.   Imaging Studies: I ordered imaging studies including plain film chest x-ray. I independently visualized and interpreted imaging which showed no significant interval change from previous, no acute intrathoracic abnormality. I agree with the radiologist interpretation.   Cardiac Monitoring:  The patient was maintained on a cardiac monitor.  My attending physician Dr. Roderic Palau viewed and interpreted the cardiac monitored which showed an underlying rhythm of: Normal sinus tachycardia with occasional PVCs. I agree with this interpretation.   Medications: I ordered medication including Keflex for sacral ulcers, fluid bolus for mild dehydration. Reevaluation of the patient after these medicines showed that the patient improved. I have reviewed the patients home medicines and have made adjustments as  needed.   Disposition: After consideration of the diagnostic results and the patients response to treatment, I feel that patient stable for discharge at this time, the wounds on the leg that they were referring to seem to be referring to patient's fibular head which is somewhat prominent given his advanced age and generalized muscle wasting,  I do not note any evidence of any other leg injuries.  He does have some ongoing sacral ulcers and we will provide him with a new prescription for Keflex to ensure bacterial prophylaxis, encouraged home wound care with zinc oxide, and continued wound care and bandage changes with home health.   emergency department workup does not suggest an emergent condition requiring admission or immediate intervention beyond what has been performed at this time. The plan is: Discharge with plan for increased sacral wound care, increased fluid intake, and close PCP follow-up. The patient is safe for discharge and has been instructed to return immediately for worsening symptoms, change in symptoms or any other concerns.  I discussed this case with my attending physician Dr. Roderic Palau who cosigned this note including patient's presenting symptoms, physical exam, and planned diagnostics and interventions. Attending physician stated agreement with plan or made changes to plan which were implemented.    Final Clinical Impression(s) / ED Diagnoses Final diagnoses:  Pressure injury of skin of sacral region, unspecified injury stage  Dehydration    Rx / DC Orders ED Discharge Orders          Ordered    cephALEXin (KEFLEX) 500 MG capsule  4 times daily        10/06/21 2237              Dorien Chihuahua 10/06/21 2336    Milton Ferguson, MD 10/07/21 1202

## 2021-10-06 NOTE — ED Notes (Signed)
FLOAT NURSE 

## 2021-10-06 NOTE — Discharge Instructions (Addendum)
Patient has ongoing pressure injuries, he needs zinc oxide paste applied to the buttocks, continued wound care, I prescribed him a new course of Keflex to help with bacterial prophylaxis, I recommend that he recheck with his primary care provider.  I did not see any evidence of a worrisome lesion on either of his legs.  The bump that think was being referred to looks to me like his fibular head which is a bony prominence.  Do not see any evidence of abscess on his legs.  He was mildly dehydrated today, I recommend encouraging plenty of oral fluid intake.  Please follow-up with PCP and home health in the next 1 to 2 weeks to ensure that patient symptoms are improving.

## 2021-10-06 NOTE — ED Triage Notes (Signed)
Pt brought in from home via EMS after pt's caregiver (new to him) noticed a knot on pt's L lower leg and felt like "something was trying to come out". Caregiver also states pt seemed altered but this was first day said caregiver was with pt. Per EMS, pt was alert and oriented. Blood glucose was 211.

## 2021-10-07 DIAGNOSIS — I10 Essential (primary) hypertension: Secondary | ICD-10-CM | POA: Diagnosis not present

## 2021-10-07 DIAGNOSIS — Z7401 Bed confinement status: Secondary | ICD-10-CM | POA: Diagnosis not present

## 2021-10-07 NOTE — ED Notes (Signed)
Spoke with Stanton Kidney, pt niece who says her son will be at pt house a/w for EMS to bring him home. Discharge instructions given to EMS and reviewed with pt and [facility].

## 2021-10-12 ENCOUNTER — Ambulatory Visit: Payer: Medicare HMO | Admitting: Internal Medicine

## 2021-10-14 DIAGNOSIS — R69 Illness, unspecified: Secondary | ICD-10-CM | POA: Diagnosis not present

## 2021-10-17 ENCOUNTER — Ambulatory Visit (INDEPENDENT_AMBULATORY_CARE_PROVIDER_SITE_OTHER): Payer: Medicare HMO | Admitting: Internal Medicine

## 2021-10-17 ENCOUNTER — Encounter: Payer: Self-pay | Admitting: Internal Medicine

## 2021-10-17 VITALS — BP 126/84 | HR 100

## 2021-10-17 DIAGNOSIS — E1122 Type 2 diabetes mellitus with diabetic chronic kidney disease: Secondary | ICD-10-CM | POA: Diagnosis not present

## 2021-10-17 DIAGNOSIS — I69351 Hemiplegia and hemiparesis following cerebral infarction affecting right dominant side: Secondary | ICD-10-CM

## 2021-10-17 DIAGNOSIS — F0390 Unspecified dementia without behavioral disturbance: Secondary | ICD-10-CM | POA: Diagnosis not present

## 2021-10-17 DIAGNOSIS — N182 Chronic kidney disease, stage 2 (mild): Secondary | ICD-10-CM

## 2021-10-17 DIAGNOSIS — I10 Essential (primary) hypertension: Secondary | ICD-10-CM | POA: Diagnosis not present

## 2021-10-17 DIAGNOSIS — Z23 Encounter for immunization: Secondary | ICD-10-CM | POA: Diagnosis not present

## 2021-10-17 DIAGNOSIS — Z794 Long term (current) use of insulin: Secondary | ICD-10-CM | POA: Diagnosis not present

## 2021-10-17 DIAGNOSIS — L89152 Pressure ulcer of sacral region, stage 2: Secondary | ICD-10-CM | POA: Diagnosis not present

## 2021-10-17 LAB — POCT GLYCOSYLATED HEMOGLOBIN (HGB A1C)
HbA1c, POC (controlled diabetic range): 5.3 % (ref 0.0–7.0)
Hemoglobin A1C: 5.3 % (ref 4.0–5.6)

## 2021-10-17 NOTE — Patient Instructions (Signed)
Please take Lantus 16 U instead of 20 U.  Please continue to follow low salt diet.  Please continue taking other medications as prescribed.

## 2021-10-17 NOTE — Assessment & Plan Note (Signed)
On Namenda Dependent for ADLs, has home health aide

## 2021-10-17 NOTE — Assessment & Plan Note (Signed)
BP Readings from Last 1 Encounters:  10/17/21 126/84   Well-controlled with Amlodipine 10 mg QD Counseled for compliance with the medications

## 2021-10-17 NOTE — Assessment & Plan Note (Signed)
Needs wound care-referred to home health Keflex for bacterial ppx Zinc oxide dressing Frequent repositioning

## 2021-10-17 NOTE — Assessment & Plan Note (Signed)
On aspirin and statin Has residual right-sided hemiparesis

## 2021-10-17 NOTE — Assessment & Plan Note (Addendum)
Lab Results  Component Value Date   HGBA1C 5.3 10/17/2021   HGBA1C 5.3 10/17/2021   Has had episode of hypoglycemia On Lantus to 20 U QD, decreased dose of Lantus to 16 U QD On statin Advised to follow diabetic diet F/u CMP and lipid panel Diabetic eye exam: Advised to follow up with Ophthalmology for diabetic eye exam  Goal HbA1C for him would be between 7.0-8.0

## 2021-10-17 NOTE — Progress Notes (Signed)
Established Patient Office Visit  Subjective:  Patient ID: Mike Briggs, male    DOB: [birth year]  Age: 88 y.o. MRN: [id number]  CC:  Chief Complaint  Patient presents with   Follow-up    6 week follow up wound on bottom is no better still painful    HPI Mike Briggs is a 88 y.o. male with past medical history of HTN, CVA with residual right sided hemiplegia, DM with CKD stage 3, Mobitz type 1 second degree AV block, dementia and BPH who presents for f/u of his chronic medical conditions.  His nursing aide is present during the visit today.  He has dementia and limited ability to communicate from CVA.  Most of the history was provided by nursing aide today.  He has a pressure ulcer of sacral region, and is currently getting regular dressing change done by nursing aide and/or home health wound care staff.  He recently went to ER for malodorous discharge from the ulcer site and was given Keflex.  Nursing aide reports that his ulcer has not been foul-smelling now, and is healing slowly.  Of note, she reports that he has a hospital bed, but he has very thin mattress and they have to use regular pillows for support.  They have been helping him with repositioning when they are around, but he is not able to change his position at nighttime.  HTN: BP is well-controlled. Takes medications regularly. Patient denies headache, dizziness, chest pain, dyspnea or palpitations.   Type II DM: He takes Lantus 20 units. Blood glucose have been mostly between 80/120, with 1 episode less than 70.  His home health aide has been helping with insulin at home. His HbA1c was 5.3 today.  CKD stage III: Denies any dysuria or hematuria.  He takes tamsulosin and finasteride for BPH.  He follows up with Dr. [name] for BPH.   Dementia: He takes Namenda for it.  No recent behavioral problem reported.  He received flu vaccine in the office today.  Past Medical History:  Diagnosis Date   Acute cystitis  12/17/2018   Acute encephalopathy 40/34/7425   Acute metabolic encephalopathy 9/56/3875   ANKLE, ARTHRITIS, DEGEN./OSTEO 12/16/2008   Qualifier: Diagnosis of  By: Aline Brochure MD, Dorothyann Peng     Arthritis    Cataract    Chronic kidney disease    COVID-19    COVID-19 virus detected 11/03/2018   COVID-19 virus infection 11/03/2018   Dehydration 11/02/2018   Depression    Diabetes mellitus    Gram-positive bacteremia 11/03/2018   Hyperlipidemia    Hypertension    Hypomagnesemia 03/24/2019   Klebsiella UTI  03/25/2019   Pain in finger of right hand 05/27/2019   Personal history of gout 05/22/2016   Rhabdomyolysis 11/02/2018   Sepsis secondary to UTI ([place]) 03/13/2021   Stroke ([place])    Swelling of both hands 05/27/2019   Syncope 02/25/2018   TIA (transient ischemic attack) 11/14/2018   UTI (urinary tract infection) 02/26/2018   Vitamin D deficiency 05/31/2016    Past Surgical History:  Procedure Laterality Date   BACK SURGERY     cervical and lumbar   CATARACT EXTRACTION     CHOLECYSTECTOMY     FOOT SURGERY     5th toe    SPINE SURGERY     lumbar and cervical fusions    Family History  Family history unknown: Yes    Social History   Socioeconomic History   Marital status: Divorced  Spouse name: Not on file   Number of children: 3   Years of education: 10   Highest education level: Not on file  Occupational History   Occupation: Retired.  Tobacco Use   Smoking status: Former    Types: Cigarettes    Quit date: 08/07/1967    Years since quitting: 54.2   Smokeless tobacco: Never  Vaping Use   Vaping Use: Never used  Substance and Sexual Activity   Alcohol use: No   Drug use: No   Sexual activity: Not Currently  Other Topics Concern   Not on file  Social History Narrative   Wheelchair bound.Has not walked in many years, at least five years.  Lives alone. Has 2 aides that come in to help him.   Lives alone.    Retired.   Eats all food groups.    Has 3 children but does not  have contact with them.   Divorced.   Social Determinants of Health   Financial Resource Strain: Low Risk  (03/02/2021)   Overall Financial Resource Strain (CARDIA)    Difficulty of Paying Living Expenses: Not hard at all  Food Insecurity: No Food Insecurity (05/09/2021)   Hunger Vital Sign    Worried About Running Out of Food in the Last Year: Never true    Ran Out of Food in the Last Year: Never true  Transportation Needs: No Transportation Needs (05/09/2021)   PRAPARE - Hydrologist (Medical): No    Lack of Transportation (Non-Medical): No  Physical Activity: Inactive (04/28/2021)   Exercise Vital Sign    Days of Exercise per Week: 0 days    Minutes of Exercise per Session: 0 min  Stress: Stress Concern Present (04/28/2021)   [place]    Feeling of Stress : To some extent  Social Connections: Socially Isolated (03/02/2021)   Social Connection and Isolation Panel [NHANES]    Frequency of Communication with Friends and Family: Never    Frequency of Social Gatherings with Friends and Family: Once a week    Attends Religious Services: Never    Marine scientist or Organizations: No    Attends Archivist Meetings: Never    Marital Status: Divorced  Human resources officer Violence: Not At Risk (03/02/2021)   Humiliation, Afraid, Rape, and Kick questionnaire    Fear of Current or Ex-Partner: No    Emotionally Abused: No    Physically Abused: No    Sexually Abused: No    Outpatient Medications Prior to Visit  Medication Sig Dispense Refill   acetaminophen (TYLENOL) 325 MG tablet Take 2 tablets (650 mg total) by mouth every 6 (six) hours as needed for mild pain or headache (or Fever >/= 101).     amLODipine (NORVASC) 10 MG tablet Take 1 tablet (10 mg total) by mouth daily. 90 tablet 1   aspirin 81 MG chewable tablet Chew 1 tablet (81 mg total) by mouth daily with breakfast. 30 tablet 3    atorvastatin (LIPITOR) 10 MG tablet TAKE 1 TABLET BY MOUTH AFTER SUPPER. 30 tablet 0   blood glucose meter kit and supplies Dispense based on patient and insurance preference. Use up to four times daily as directed. (FOR ICD-10 E10.9, E11.9). 1 each 0   cephALEXin (KEFLEX) 500 MG capsule Take 1 capsule (500 mg total) by mouth 4 (four) times daily. 28 capsule 0   diclofenac Sodium (VOLTAREN) 1 % GEL Apply 4  g topically 3 (three) times daily as needed (arthritic wrist pain). 100 g 0   Ensure Max Protein (ENSURE MAX PROTEIN) LIQD Take 330 mLs (11 oz total) by mouth daily.     LANTUS SOLOSTAR 100 UNIT/ML Solostar Pen Inject 20 Units into the skin daily. 15 mL 3   memantine (NAMENDA) 10 MG tablet TAKE (1) TABLET BY MOUTH TWICE DAILY. 60 tablet 0   pantoprazole (PROTONIX) 40 MG tablet TAKE ONE TABLET BY MOUTH ONCE DAILY. (Patient taking differently: Take 40 mg by mouth daily.) 90 tablet 3   polyethylene glycol (MIRALAX / GLYCOLAX) 17 g packet Take 17 g by mouth daily. 14 each 0   senna-docusate (SENOKOT-S) 8.6-50 MG tablet Take 2 tablets by mouth at bedtime.     SURE COMFORT PEN NEEDLES 31G X 5 MM MISC USE TO INJECT AS DIRECTED FOUR TIMES A DAY. WITH MEALS AND BEFORE BEDTIME. 100 each 0   tamsulosin (FLOMAX) 0.4 MG CAPS capsule TAKE 1 CAPSULE BY MOUTH DAILY AFTER BREAKFAST. 30 capsule 11   trimethoprim (TRIMPEX) 100 MG tablet Take 100 mg by mouth daily.     UNABLE TO FIND Med Name: 1 doughnut pillow to prevent pressure ulcer on his bottom 1 each 0   ZINC OXIDE, TOPICAL, 25 % OINT Use over sacral ulcer for dressing. 56.7 g 0   No facility-administered medications prior to visit.    No Known Allergies  ROS Review of Systems  Constitutional:  Negative for chills and fever.  HENT:  Negative for congestion and sore throat.   Eyes:  Negative for pain and discharge.  Respiratory:  Negative for cough and shortness of breath.   Cardiovascular:  Negative for chest pain and palpitations.   Gastrointestinal:  Negative for constipation, diarrhea, nausea and vomiting.  Endocrine: Negative for polydipsia and polyuria.  Genitourinary:  Negative for dysuria and hematuria.  Musculoskeletal:  Positive for arthralgias. Negative for neck pain and neck stiffness.  Skin:  Positive for wound. Negative for rash.  Neurological:  Positive for weakness (B/l LE) and numbness (R foot). Negative for dizziness and headaches.  Psychiatric/Behavioral:  Negative for agitation and behavioral problems.       Objective:    Physical Exam Vitals reviewed.  Constitutional:      General: He is not in acute distress.    Appearance: He is obese. He is not diaphoretic.     Comments: In wheelchair  HENT:     Head: Normocephalic and atraumatic.     Nose: Nose normal.     Mouth/Throat:     Mouth: Mucous membranes are moist.  Eyes:     General: No scleral icterus.    Extraocular Movements: Extraocular movements intact.  Cardiovascular:     Rate and Rhythm: Normal rate and regular rhythm.     Heart sounds: Normal heart sounds. No murmur heard. Pulmonary:     Breath sounds: Normal breath sounds. No wheezing or rales.  Musculoskeletal:        General: Swelling (R wrist and hand) present.     Cervical back: Neck supple. No tenderness.     Right lower leg: Edema (2+) present.     Left lower leg: Edema (2+) present.  Skin:    General: Skin is warm.     Comments: Pressure ulcer over sacral area and left buttock area, clear discharge noted   Neurological:     General: No focal deficit present.     Mental Status: He is alert and oriented to person,  place, and time.     Sensory: Sensory deficit (R foot) present.     Motor: Weakness (RUE - 1/5, B/l LE - 2/5) present.  Psychiatric:        Mood and Affect: Mood normal.        Behavior: Behavior normal.     BP 126/84 (BP Location: Left Arm, Patient Position: Sitting, Cuff Size: Normal)   Pulse 100   SpO2 99%  Wt Readings from Last 3 Encounters:   10/06/21 224 lb 13.9 oz (102 kg)  08/18/21 225 lb (102.1 kg)  06/08/21 225 lb 8.5 oz (102.3 kg)    Lab Results  Component Value Date   TSH 0.381 06/09/2021   Lab Results  Component Value Date   WBC 10.9 (H) 10/06/2021   HGB 8.8 (L) 10/06/2021   HCT 30.1 (L) 10/06/2021   MCV 92.6 10/06/2021   PLT 522 (H) 10/06/2021   Lab Results  Component Value Date   NA 141 10/06/2021   K 3.8 10/06/2021   CO2 28 10/06/2021   GLUCOSE 154 (H) 10/06/2021   BUN 17 10/06/2021   CREATININE 1.39 (H) 10/06/2021   BILITOT 0.6 06/08/2021   ALKPHOS 117 06/08/2021   AST 12 (L) 06/08/2021   ALT 6 06/08/2021   PROT 6.6 06/08/2021   ALBUMIN 2.6 (L) 06/08/2021   CALCIUM 7.8 (L) 10/06/2021   ANIONGAP 7 10/06/2021   EGFR 65 07/04/2021   Lab Results  Component Value Date   CHOL 138 12/06/2020   Lab Results  Component Value Date   HDL 37 (L) 12/06/2020   Lab Results  Component Value Date   LDLCALC 83 12/06/2020   Lab Results  Component Value Date   TRIG 93 12/06/2020   Lab Results  Component Value Date   CHOLHDL 3.7 12/06/2020   Lab Results  Component Value Date   HGBA1C 5.3 10/17/2021   HGBA1C 5.3 10/17/2021      Assessment & Plan:   Problem List Items Addressed This Visit       Cardiovascular and Mediastinum   Essential hypertension    BP Readings from Last 1 Encounters:  10/17/21 126/84  Well-controlled with Amlodipine 10 mg QD Counseled for compliance with the medications        Endocrine   T2DM (type 2 diabetes mellitus) ([place]) - Primary    Lab Results  Component Value Date   HGBA1C 5.3 10/17/2021   HGBA1C 5.3 10/17/2021  Has had episode of hypoglycemia On Lantus to 20 U QD, decreased dose of Lantus to 16 U QD On statin Advised to follow diabetic diet F/u CMP and lipid panel Diabetic eye exam: Advised to follow up with Ophthalmology for diabetic eye exam  Goal HbA1C for him would be between 7.0-8.0      Relevant Orders   POCT glycosylated hemoglobin (Hb  A1C) (Completed)     Nervous and Auditory   Hemiparesis affecting right side as late effect of cerebrovascular accident (CVA) ([place])    On aspirin and statin Has residual right-sided hemiparesis      Dementia without behavioral disturbance ([place])    On Namenda Dependent for ADLs, has home health aide        Other   Pressure injury of skin    Needs wound care-referred to home health Keflex for bacterial ppx Zinc oxide dressing Frequent repositioning      Other Visit Diagnoses     Need for immunization against influenza       Relevant Orders  Flu Vaccine QUAD High Dose(Fluad) (Completed)       No orders of the defined types were placed in this encounter.   Follow-up: Return in about 4 months (around 02/16/2022) for DM and HTN.    [name], MD

## 2021-10-19 ENCOUNTER — Telehealth: Payer: Medicare HMO

## 2021-10-19 ENCOUNTER — Telehealth: Payer: Self-pay | Admitting: *Deleted

## 2021-10-19 NOTE — Telephone Encounter (Signed)
  Care Management   Follow Up Note   10/19/2021 Name: Mike Briggs MRN: [id number] DOB: [birth year]   Referred by: [name], MD Reason for referral : Chronic Care Management   An unsuccessful telephone outreach was attempted today. The patient was referred to the case management team for assistance with care management and care coordination.   Follow Up Plan: Telephone follow up appointment with care management team member scheduled for: upon care guide rescheduling.  Jacqlyn Larsen [facility], BSN RN Case Manager [facility] [phone]

## 2021-10-25 ENCOUNTER — Other Ambulatory Visit: Payer: Self-pay | Admitting: Internal Medicine

## 2021-10-30 ENCOUNTER — Ambulatory Visit (INDEPENDENT_AMBULATORY_CARE_PROVIDER_SITE_OTHER): Payer: Medicare HMO

## 2021-10-30 DIAGNOSIS — L89152 Pressure ulcer of sacral region, stage 2: Secondary | ICD-10-CM

## 2021-10-30 DIAGNOSIS — E1122 Type 2 diabetes mellitus with diabetic chronic kidney disease: Secondary | ICD-10-CM

## 2021-10-30 DIAGNOSIS — I10 Essential (primary) hypertension: Secondary | ICD-10-CM

## 2021-10-30 NOTE — Chronic Care Management (AMB) (Signed)
Chronic Care Management   CCM RN Visit Note  10/30/2021 Name: Mike Briggs MRN: [id number] DOB: 09/29/1928  Subjective: Mike Briggs is a 88 y.o. year old male who is a primary care patient of [name], MD. The care management team was consulted for assistance with disease management and care coordination needs.    Engaged with patient by telephone for follow up visit in response to provider referral for case management and/or care coordination services.   Consent to Services:  The patient was given information about Chronic Care Management services, agreed to services, and gave verbal consent prior to initiation of services.  Please see initial visit note for detailed documentation.   Patient agreed to services and verbal consent obtained.   Assessment: Review of patient past medical history, allergies, medications, health status, including review of consultants reports, laboratory and other test data, was performed as part of comprehensive evaluation and provision of chronic care management services.   SDOH (Social Determinants of Health) assessments and interventions performed:  SDOH Interventions    Flowsheet Row Chronic Care Management from 05/09/2021 in [facility] Chronic Care Management from 04/28/2021 in [facility] from 03/28/2021 in [facility] from 03/02/2021 in [facility]  SDOH Interventions      Food Insecurity Interventions Intervention Not Indicated -- -- Intervention Not Indicated  Housing Interventions Intervention Not Indicated -- -- Intervention Not Indicated  Transportation Interventions Intervention Not Indicated -- -- Intervention Not Indicated  Depression Interventions/Treatment  -- Counseling -- --  Financial Strain Interventions -- -- -- Intervention Not Indicated  Physical Activity Interventions -- Other (Comments)  [uses a motorized wheelchair to ambulate] Other  (Comments)  [client cannot walk, He uses a electric wheelchair for ambulation] Intervention Not Indicated  Stress Interventions -- Other (Comment)  [client has stress related to transfers. He uses a motorized wheelchair to ambulate He wants to talk with MD about getting a new hospital bed. He is having trouble with current hospital bed] Other (Comment)  [client has some stress related to managing medical needs] Intervention Not Indicated  Social Connections Interventions -- -- -- Intervention Not Indicated        CCM Care Plan  No Known Allergies  Outpatient Encounter Medications as of 10/30/2021  Medication Sig   acetaminophen (TYLENOL) 325 MG tablet Take 2 tablets (650 mg total) by mouth every 6 (six) hours as needed for mild pain or headache (or Fever >/= 101).   amLODipine (NORVASC) 10 MG tablet Take 1 tablet (10 mg total) by mouth daily.   aspirin 81 MG chewable tablet Chew 1 tablet (81 mg total) by mouth daily with breakfast.   atorvastatin (LIPITOR) 10 MG tablet TAKE 1 TABLET BY MOUTH AFTER SUPPER.   blood glucose meter kit and supplies Dispense based on patient and insurance preference. Use up to four times daily as directed. (FOR ICD-10 E10.9, E11.9).   diclofenac Sodium (VOLTAREN) 1 % GEL Apply 4 g topically 3 (three) times daily as needed (arthritic wrist pain).   Ensure Max Protein (ENSURE MAX PROTEIN) LIQD Take 330 mLs (11 oz total) by mouth daily.   LANTUS SOLOSTAR 100 UNIT/ML Solostar Pen Inject 20 Units into the skin daily.   memantine (NAMENDA) 10 MG tablet TAKE (1) TABLET BY MOUTH TWICE DAILY.   pantoprazole (PROTONIX) 40 MG tablet TAKE ONE TABLET BY MOUTH ONCE DAILY. (Patient taking differently: Take 40 mg by mouth daily.)   polyethylene glycol (MIRALAX /  GLYCOLAX) 17 g packet Take 17 g by mouth daily.   senna-docusate (SENOKOT-S) 8.6-50 MG tablet Take 2 tablets by mouth at bedtime.   SURE COMFORT PEN NEEDLES 31G X 5 MM MISC USE TO INJECT AS DIRECTED FOUR TIMES A DAY. WITH  MEALS AND BEFORE BEDTIME.   tamsulosin (FLOMAX) 0.4 MG CAPS capsule TAKE 1 CAPSULE BY MOUTH DAILY AFTER BREAKFAST.   trimethoprim (TRIMPEX) 100 MG tablet Take 100 mg by mouth daily.   UNABLE TO FIND Med Name: 1 doughnut pillow to prevent pressure ulcer on his bottom   ZINC OXIDE, TOPICAL, 25 % OINT Use over sacral ulcer for dressing.   cephALEXin (KEFLEX) 500 MG capsule Take 1 capsule (500 mg total) by mouth 4 (four) times daily. (Patient not taking: Reported on 10/30/2021)   No facility-administered encounter medications on file as of 10/30/2021.    Patient Active Problem List   Diagnosis Date Noted   Arthritis of hand 06/08/2021   Syncope 06/08/2021   T2DM (type 2 diabetes mellitus) ([place])    Hypertension    Chronic kidney disease    Chronic cystitis 04/25/2021   Hospital discharge follow-up 03/21/2021   Physical deconditioning 03/14/2021   UTI (urinary tract infection) 03/13/2021   Dementia without behavioral disturbance ([place]) 03/13/2021   Chronic kidney disease, stage 3b ([place]) 03/13/2021   Encounter for examination following treatment at hospital 03/08/2021   Dysphagia    Gastroesophageal reflux disease    Chest pain 01/26/2021   Foot ulcer ([place]) 11/24/2020   PVD (peripheral vascular disease) ([place]) 11/24/2020   Polyarthritis 12/10/2019   Hydrocele 05/27/2019   Mobitz type 1 second degree atrioventricular block    Pressure injury of skin 11/08/2018   Essential hypertension 05/22/2016   Hemiparesis affecting right side as late effect of cerebrovascular accident (CVA) ([place]) 05/22/2016   BPH (benign prostatic hyperplasia) 05/22/2016   Chronic constipation 05/22/2016   Hyperlipidemia 05/22/2016   Pseudophakia 12/06/2015   Vascular dementia ([place]) 10/23/2012   Type 2 diabetes mellitus with diabetic chronic kidney disease ([place]) 12/16/2008    Conditions to be addressed/monitored:HTN, DMII, and Pressure injury of skin  Care Plan : RN Care Manager Plan of Care  Updates made by  [name], RN since 10/30/2021 12:00 AM     Problem: No plan of care established for management of chronic disease state  (HTN, DM2, Pressure injury of skin)   Priority: High     Long-Range Goal: Development of plan of care for chronic disease management  (HTN, DM2, Pressure injury of skin)   Start Date: 05/09/2021  Expected End Date: 04/28/2022  Priority: High  Note:   Current Barriers:  Knowledge Deficits related to plan of care for management of HTN and DMII  Chronic Disease Management support and education needs related to HTN and DMII [name] Kloepfer (niece is POA) and reports patient lives alone, has CAP Aide that comes in the morning and then again in the evening, pt is alone during the day and has managed well with this, has prefilled med packs, cystitis is resolved and follows up with Dr. [name], CAP aide checks CBG BID, AIC is 5.8, blood pressure is now being monitored per aide Nivea . Patient does not follow a special diet, receives meals on wheels and aide cooks, has DME such as motorized wheel chair and walker in the home, pt is unable to walk, has Life Alert. 08/30/21- spoke with patient's niece [name] who reports patient continues to have CAP aide and she prefers  RN care manager call aide for CBG readings, etc as [name] is not sure what they are, [name] states she will let patient and aide know that RN care manager will call next week 09/07/21. [name] states she continues to grocery shop, pay bills, etc for patient, states patient has medications. 10/30/21- spoke with CNA Nivea (CAP aide) who reports she checks CBG daily with readings ranging 92-129, pt has all medications and taking as prescribed, blood pressure is being monitored, home health nurse continues providing wound care for stage 2 sacral pressure ulcer and aide states " it does look better", states pt does not have a good mattress, asks if pt would qualify for a better mattress. RNCM Clinical Goal(s):  Patient will verbalize  understanding of plan for management of HTN and DMII as evidenced by patient, caregiver report, review of EHR and  through collaboration with RN Care manager, provider, and care team.   Interventions: 1:1 collaboration with primary care provider regarding development and update of comprehensive plan of care as evidenced by provider attestation and co-signature Inter-disciplinary care team collaboration (see longitudinal plan of care) Evaluation of current treatment plan related to  self management and patient's adherence to plan as established by provider   Diabetes Interventions:  (Status:  New goal. and Goal on track:  Yes.) Long Term Goal Assessed patient's understanding of A1c goal: <7% Reviewed medications with patient and discussed importance of medication adherence Counseled on importance of regular laboratory monitoring as prescribed Advised patient, providing education and rationale, to check cbg as prescribed and record, calling primary care provider for findings outside established parameters Review of patient status, including review of consultants reports, relevant laboratory and other test results, and medications completed Reinforced carbohydrate modified diet and food choices Reinforced importance of good nutrition and adequate protein for wound healing Reinforced importance of changing positions 2 hours Reviewed signs/ symptoms of infection  Lab Results  Component Value Date   HGBA1C 5.8 (H) 03/13/2021   Hypertension Interventions:  (Status:  New goal. and Goal on track:  Yes.) Long Term Goal Last practice recorded BP readings:  BP Readings from Last 3 Encounters:  04/04/21 (!) 130/58  03/21/21 102/72  03/16/21 (!) 155/91  Most recent eGFR/CrCl:  Lab Results  Component Value Date   EGFR 60 03/21/2021    No components found for: CRCL  Evaluation of current treatment plan related to hypertension self management and patient's adherence to plan as established by  provider Reviewed medications with patient and discussed importance of compliance Discussed plans with patient for ongoing care management follow up and provided patient with direct contact information for care management team Discussed complications of poorly controlled blood pressure such as heart disease, stroke, circulatory complications, vision complications, kidney impairment, sexual dysfunction Reviewed importance of adherence to low sodium diet, limiting fast food Reinforced with CAP aide the importance of blood pressure monitoring and keeping a log  Pressure injury of skin:  (Status:  New goal. and Goal on track:  Yes.)  Long Term Goal Evaluation of current treatment plan related to  pressure injury of skin (sacrum) , Cognitive Deficits and Inability to perform ADL's independently self-management and patient's adherence to plan as established by provider. Discussed plans with patient for ongoing care management follow up and provided patient with direct contact information for care management team Evaluation of current treatment plan related to pressure injury of skin and patient's adherence to plan as established by provider Provided education to patient re: continuing to work with  home health nurse, report new findings to home health nurse/ doctor, change positions q 2 hours and include adequate protein in diet  Reviewed signs/ symptoms of infection In basket message sent to primary care provider informing that patient can benefit from a pressure reducing, low air loss mattress, the need for patient to be seen to determine necessity and have wound assessed, instructed aide to talk with Gretchen Portela and make appointment for patient, verbalizes understanding. Press photographer called [name] and no answer to telephone)  Patient Goals/Self-Care Activities: Take medications as prescribed   Attend all scheduled provider appointments Call pharmacy for medication refills 3-7 days in advance of  running out of medications Call provider office for new concerns or questions  Work with the social worker to address care coordination needs and will continue to work with the clinical team to address health care and disease management related needs check blood sugar at prescribed times: twice daily check feet daily for cuts, sores or redness enter blood sugar readings and medication or insulin into daily log take the blood sugar log to all doctor visits take the blood sugar meter to all doctor visits trim toenails straight across fill half of plate with vegetables read food labels for fat, fiber, carbohydrates and portion size wash and dry feet carefully every day wear comfortable, cotton socks wear comfortable, well-fitting shoes check blood pressure weekly choose a place to take my blood pressure (home, clinic or office, retail store) write blood pressure results in a log or diary learn about high blood pressure keep a blood pressure log take blood pressure log to all doctor appointments keep all doctor appointments take medications for blood pressure exactly as prescribed report new symptoms to your doctor eat more whole grains, fruits and vegetables, lean meats and healthy fats Follow low sodium diet- limit fast food and read labels Change positions every 2 hours Continue working with home health for dressing changes Please practice good handwashing with dressing changes Have patient eat as healthy as possible with lean meats, adequate vegetables and fruit Please schedule an appointment for pt to see primary care provider and have wound assessed to proceed with getting a pressure reducing mattress        Plan:Telephone follow up appointment with care management team member scheduled for:  12/05/21  Jacqlyn Larsen [facility], BSN RN Case Manager [facility] [phone]

## 2021-10-30 NOTE — Patient Instructions (Signed)
Visit Information  Thank you for taking time to visit with me today. Please don't hesitate to contact me if I can be of assistance to you before our next scheduled telephone appointment.  Following are the goals we discussed today:  ake medications as prescribed   Attend all scheduled provider appointments Call pharmacy for medication refills 3-7 days in advance of running out of medications Call provider office for new concerns or questions  Work with the social worker to address care coordination needs and will continue to work with the clinical team to address health care and disease management related needs check blood sugar at prescribed times: twice daily check feet daily for cuts, sores or redness enter blood sugar readings and medication or insulin into daily log take the blood sugar log to all doctor visits take the blood sugar meter to all doctor visits trim toenails straight across fill half of plate with vegetables read food labels for fat, fiber, carbohydrates and portion size wash and dry feet carefully every day wear comfortable, cotton socks wear comfortable, well-fitting shoes check blood pressure weekly choose a place to take my blood pressure (home, clinic or office, retail store) write blood pressure results in a log or diary learn about high blood pressure keep a blood pressure log take blood pressure log to all doctor appointments keep all doctor appointments take medications for blood pressure exactly as prescribed report new symptoms to your doctor eat more whole grains, fruits and vegetables, lean meats and healthy fats Follow low sodium diet- limit fast food and read labels Change positions every 2 hours Continue working with home health for dressing changes Please practice good handwashing with dressing changes Have patient eat as healthy as possible with lean meats, adequate vegetables and fruit Please schedule an appointment for pt to see primary care  provider and have wound assessed to proceed with getting a pressure reducing mattress  Our next appointment is by telephone on 12/05/21 at 1 pm  Please call the care guide team at [phone] if you need to cancel or reschedule your appointment.   If you are experiencing a Mental Health or [place] or need someone to talk to, please call the Suicide and Crisis Lifeline: 988 call the [place] National Suicide Prevention Lifeline: 941-579-1061 or TTY: 262-782-9217 TTY (925) 240-3909) to talk to a trained counselor call 1-800-273-TALK (toll free, 24 hour hotline) go to [facility] [address] [place] [phone]) call the [place]: [phone] call 911   The patient verbalized understanding of instructions, educational materials, and care plan provided today and DECLINED offer to receive copy of patient instructions, educational materials, and care plan.   Jacqlyn Larsen [facility], BSN RN Case Manager [facility] [phone]

## 2021-10-31 ENCOUNTER — Telehealth: Payer: Self-pay | Admitting: Internal Medicine

## 2021-10-31 NOTE — Telephone Encounter (Signed)
Mike Briggs  [phone]) w[facility] called in on patient behalf.  Wants to get new order for wound care  Patient has yellow and black tissue.  New orders  Medi honey Silver alginate  Air Mattress

## 2021-10-31 NOTE — Telephone Encounter (Signed)
Verbal orders given  

## 2021-11-06 DIAGNOSIS — K219 Gastro-esophageal reflux disease without esophagitis: Secondary | ICD-10-CM

## 2021-11-06 DIAGNOSIS — E669 Obesity, unspecified: Secondary | ICD-10-CM

## 2021-11-06 DIAGNOSIS — N433 Hydrocele, unspecified: Secondary | ICD-10-CM

## 2021-11-06 DIAGNOSIS — E559 Vitamin D deficiency, unspecified: Secondary | ICD-10-CM

## 2021-11-06 DIAGNOSIS — E1151 Type 2 diabetes mellitus with diabetic peripheral angiopathy without gangrene: Secondary | ICD-10-CM

## 2021-11-06 DIAGNOSIS — Z87440 Personal history of urinary (tract) infections: Secondary | ICD-10-CM

## 2021-11-06 DIAGNOSIS — R1310 Dysphagia, unspecified: Secondary | ICD-10-CM

## 2021-11-06 DIAGNOSIS — I69351 Hemiplegia and hemiparesis following cerebral infarction affecting right dominant side: Secondary | ICD-10-CM

## 2021-11-06 DIAGNOSIS — I129 Hypertensive chronic kidney disease with stage 1 through stage 4 chronic kidney disease, or unspecified chronic kidney disease: Secondary | ICD-10-CM | POA: Diagnosis not present

## 2021-11-06 DIAGNOSIS — Z8616 Personal history of COVID-19: Secondary | ICD-10-CM

## 2021-11-06 DIAGNOSIS — N39498 Other specified urinary incontinence: Secondary | ICD-10-CM

## 2021-11-06 DIAGNOSIS — M19049 Primary osteoarthritis, unspecified hand: Secondary | ICD-10-CM

## 2021-11-06 DIAGNOSIS — F32A Depression, unspecified: Secondary | ICD-10-CM

## 2021-11-06 DIAGNOSIS — L89622 Pressure ulcer of left heel, stage 2: Secondary | ICD-10-CM | POA: Diagnosis not present

## 2021-11-06 DIAGNOSIS — E785 Hyperlipidemia, unspecified: Secondary | ICD-10-CM

## 2021-11-06 DIAGNOSIS — N1832 Chronic kidney disease, stage 3b: Secondary | ICD-10-CM | POA: Diagnosis not present

## 2021-11-06 DIAGNOSIS — F0150 Vascular dementia without behavioral disturbance: Secondary | ICD-10-CM

## 2021-11-06 DIAGNOSIS — M109 Gout, unspecified: Secondary | ICD-10-CM

## 2021-11-06 DIAGNOSIS — Z6830 Body mass index (BMI) 30.0-30.9, adult: Secondary | ICD-10-CM

## 2021-11-06 DIAGNOSIS — Z87891 Personal history of nicotine dependence: Secondary | ICD-10-CM

## 2021-11-06 DIAGNOSIS — S71012D Laceration without foreign body, left hip, subsequent encounter: Secondary | ICD-10-CM | POA: Diagnosis not present

## 2021-11-06 DIAGNOSIS — Z794 Long term (current) use of insulin: Secondary | ICD-10-CM

## 2021-11-06 DIAGNOSIS — E1122 Type 2 diabetes mellitus with diabetic chronic kidney disease: Secondary | ICD-10-CM | POA: Diagnosis not present

## 2021-11-06 DIAGNOSIS — N401 Enlarged prostate with lower urinary tract symptoms: Secondary | ICD-10-CM

## 2021-11-06 DIAGNOSIS — Z961 Presence of intraocular lens: Secondary | ICD-10-CM

## 2021-11-06 DIAGNOSIS — K5909 Other constipation: Secondary | ICD-10-CM

## 2021-11-06 DIAGNOSIS — L89153 Pressure ulcer of sacral region, stage 3: Secondary | ICD-10-CM | POA: Diagnosis not present

## 2021-11-06 DIAGNOSIS — L89612 Pressure ulcer of right heel, stage 2: Secondary | ICD-10-CM | POA: Diagnosis not present

## 2021-11-06 DIAGNOSIS — L89893 Pressure ulcer of other site, stage 3: Secondary | ICD-10-CM | POA: Diagnosis not present

## 2021-11-06 DIAGNOSIS — Z9049 Acquired absence of other specified parts of digestive tract: Secondary | ICD-10-CM

## 2021-11-06 DIAGNOSIS — I441 Atrioventricular block, second degree: Secondary | ICD-10-CM

## 2021-11-06 DIAGNOSIS — M19079 Primary osteoarthritis, unspecified ankle and foot: Secondary | ICD-10-CM | POA: Diagnosis not present

## 2021-11-06 DIAGNOSIS — M6282 Rhabdomyolysis: Secondary | ICD-10-CM

## 2021-11-07 DIAGNOSIS — S71012D Laceration without foreign body, left hip, subsequent encounter: Secondary | ICD-10-CM | POA: Diagnosis not present

## 2021-11-07 DIAGNOSIS — L89622 Pressure ulcer of left heel, stage 2: Secondary | ICD-10-CM | POA: Diagnosis not present

## 2021-11-07 DIAGNOSIS — E1122 Type 2 diabetes mellitus with diabetic chronic kidney disease: Secondary | ICD-10-CM | POA: Diagnosis not present

## 2021-11-07 DIAGNOSIS — L89153 Pressure ulcer of sacral region, stage 3: Secondary | ICD-10-CM | POA: Diagnosis not present

## 2021-11-07 DIAGNOSIS — N1832 Chronic kidney disease, stage 3b: Secondary | ICD-10-CM | POA: Diagnosis not present

## 2021-11-07 DIAGNOSIS — I129 Hypertensive chronic kidney disease with stage 1 through stage 4 chronic kidney disease, or unspecified chronic kidney disease: Secondary | ICD-10-CM | POA: Diagnosis not present

## 2021-11-07 DIAGNOSIS — M19079 Primary osteoarthritis, unspecified ankle and foot: Secondary | ICD-10-CM | POA: Diagnosis not present

## 2021-11-07 DIAGNOSIS — L89612 Pressure ulcer of right heel, stage 2: Secondary | ICD-10-CM | POA: Diagnosis not present

## 2021-11-07 DIAGNOSIS — L89893 Pressure ulcer of other site, stage 3: Secondary | ICD-10-CM | POA: Diagnosis not present

## 2021-11-15 ENCOUNTER — Other Ambulatory Visit: Payer: Self-pay | Admitting: Internal Medicine

## 2021-11-15 DIAGNOSIS — F01B0 Vascular dementia, moderate, without behavioral disturbance, psychotic disturbance, mood disturbance, and anxiety: Secondary | ICD-10-CM

## 2021-11-15 DIAGNOSIS — I10 Essential (primary) hypertension: Secondary | ICD-10-CM

## 2021-11-15 DIAGNOSIS — I69351 Hemiplegia and hemiparesis following cerebral infarction affecting right dominant side: Secondary | ICD-10-CM

## 2021-11-15 DIAGNOSIS — E1122 Type 2 diabetes mellitus with diabetic chronic kidney disease: Secondary | ICD-10-CM

## 2021-11-16 ENCOUNTER — Other Ambulatory Visit: Payer: Self-pay | Admitting: Internal Medicine

## 2021-11-16 ENCOUNTER — Telehealth: Payer: Self-pay | Admitting: Internal Medicine

## 2021-11-16 DIAGNOSIS — R82998 Other abnormal findings in urine: Secondary | ICD-10-CM

## 2021-11-16 DIAGNOSIS — L89610 Pressure ulcer of right heel, unstageable: Secondary | ICD-10-CM | POA: Diagnosis not present

## 2021-11-16 DIAGNOSIS — Z515 Encounter for palliative care: Secondary | ICD-10-CM | POA: Diagnosis not present

## 2021-11-16 DIAGNOSIS — L89153 Pressure ulcer of sacral region, stage 3: Secondary | ICD-10-CM

## 2021-11-16 DIAGNOSIS — F01C0 Vascular dementia, severe, without behavioral disturbance, psychotic disturbance, mood disturbance, and anxiety: Secondary | ICD-10-CM | POA: Diagnosis not present

## 2021-11-16 DIAGNOSIS — L89616 Pressure-induced deep tissue damage of right heel: Secondary | ICD-10-CM | POA: Diagnosis not present

## 2021-11-16 DIAGNOSIS — L89626 Pressure-induced deep tissue damage of left heel: Secondary | ICD-10-CM | POA: Diagnosis not present

## 2021-11-16 DIAGNOSIS — N390 Urinary tract infection, site not specified: Secondary | ICD-10-CM | POA: Diagnosis not present

## 2021-11-16 MED ORDER — SANTYL 250 UNIT/GM EX OINT: 1.0000 | TOPICAL_OINTMENT | Freq: Every day | CUTANEOUS | 2 refills | Status: AC

## 2021-11-16 NOTE — Telephone Encounter (Signed)
Please advise 

## 2021-11-16 NOTE — Telephone Encounter (Signed)
Verbal orders given  

## 2021-11-16 NOTE — Telephone Encounter (Signed)
Spoke with [place] gave verbal for ua and santyl, please send rx to [facility]. Wants verbal on if they can also use a condom cath to protect wound

## 2021-11-16 NOTE — Telephone Encounter (Signed)
Tabitha w. [facility] called in on patient behalf.   Requesting orders   Urine specimen * patient urine dark almost brown   Santyl for patient wound  Referral to wound care center    Call back for [facility] [phone]

## 2021-11-18 DIAGNOSIS — I10 Essential (primary) hypertension: Secondary | ICD-10-CM | POA: Diagnosis not present

## 2021-11-18 DIAGNOSIS — Z794 Long term (current) use of insulin: Secondary | ICD-10-CM | POA: Diagnosis not present

## 2021-11-18 DIAGNOSIS — I469 Cardiac arrest, cause unspecified: Secondary | ICD-10-CM | POA: Diagnosis not present

## 2021-11-18 DIAGNOSIS — E1159 Type 2 diabetes mellitus with other circulatory complications: Secondary | ICD-10-CM | POA: Diagnosis not present

## 2021-11-20 ENCOUNTER — Other Ambulatory Visit: Payer: Self-pay | Admitting: Internal Medicine

## 2021-11-20 DIAGNOSIS — N3000 Acute cystitis without hematuria: Secondary | ICD-10-CM

## 2021-11-20 MED ORDER — CIPROFLOXACIN HCL 500 MG PO TABS: 500.0000 mg | ORAL_TABLET | Freq: Every day | ORAL | 0 refills | Status: AC

## 2021-11-29 DEATH — deceased

## 2021-12-05 ENCOUNTER — Ambulatory Visit: Payer: Self-pay | Admitting: *Deleted

## 2021-12-05 ENCOUNTER — Telehealth: Payer: Medicare HMO

## 2021-12-05 NOTE — Chronic Care Management (AMB) (Signed)
   12/05/2021  Mike Briggs Nov 25, 1928 [id number]   Patient is deceased, care plan resolved.  Jacqlyn Larsen [facility], BSN RN Case Manager [facility] [phone]

## 2021-12-28 ENCOUNTER — Ambulatory Visit: Payer: Medicare HMO | Admitting: Urology

## 2022-02-20 ENCOUNTER — Ambulatory Visit: Payer: Medicare HMO | Admitting: Internal Medicine
